# Patient Record
Sex: Female | Born: 1935
Health system: Southern US, Community
[De-identification: ages and names within clinical notes are randomized; demographics above are authoritative.]

## PROBLEM LIST (undated history)

## (undated) DIAGNOSIS — H5440 Blindness, one eye, unspecified eye: Secondary | ICD-10-CM

## (undated) DIAGNOSIS — IMO0001 Reserved for inherently not codable concepts without codable children: Secondary | ICD-10-CM

## (undated) DIAGNOSIS — K219 Gastro-esophageal reflux disease without esophagitis: Secondary | ICD-10-CM

## (undated) DIAGNOSIS — M1990 Unspecified osteoarthritis, unspecified site: Secondary | ICD-10-CM

## (undated) DIAGNOSIS — D329 Benign neoplasm of meninges, unspecified: Secondary | ICD-10-CM

## (undated) DIAGNOSIS — R079 Chest pain, unspecified: Secondary | ICD-10-CM

## (undated) DIAGNOSIS — IMO0002 Reserved for concepts with insufficient information to code with codable children: Secondary | ICD-10-CM

## (undated) DIAGNOSIS — C801 Malignant (primary) neoplasm, unspecified: Secondary | ICD-10-CM

## (undated) DIAGNOSIS — Z8052 Family history of malignant neoplasm of bladder: Secondary | ICD-10-CM

## (undated) DIAGNOSIS — I639 Cerebral infarction, unspecified: Secondary | ICD-10-CM

## (undated) DIAGNOSIS — D0330 Melanoma in situ of unspecified part of face: Principal | ICD-10-CM

## (undated) DIAGNOSIS — C50919 Malignant neoplasm of unspecified site of unspecified female breast: Secondary | ICD-10-CM

## (undated) DIAGNOSIS — Z803 Family history of malignant neoplasm of breast: Secondary | ICD-10-CM

## (undated) DIAGNOSIS — C4490 Unspecified malignant neoplasm of skin, unspecified: Secondary | ICD-10-CM

## (undated) DIAGNOSIS — E039 Hypothyroidism, unspecified: Secondary | ICD-10-CM

## (undated) DIAGNOSIS — Z9289 Personal history of other medical treatment: Secondary | ICD-10-CM

## (undated) DIAGNOSIS — Z8042 Family history of malignant neoplasm of prostate: Secondary | ICD-10-CM

## (undated) DIAGNOSIS — R911 Solitary pulmonary nodule: Secondary | ICD-10-CM

## (undated) DIAGNOSIS — Z923 Personal history of irradiation: Secondary | ICD-10-CM

## (undated) DIAGNOSIS — E785 Hyperlipidemia, unspecified: Secondary | ICD-10-CM

## (undated) DIAGNOSIS — I447 Left bundle-branch block, unspecified: Secondary | ICD-10-CM

## (undated) DIAGNOSIS — I10 Essential (primary) hypertension: Secondary | ICD-10-CM

## (undated) HISTORY — DX: Reserved for inherently not codable concepts without codable children: IMO0001

## (undated) HISTORY — DX: Malignant neoplasm of unspecified site of unspecified female breast: C50.919

## (undated) HISTORY — PX: DILATION AND CURETTAGE OF UTERUS: SHX78

## (undated) HISTORY — DX: Melanoma in situ of unspecified part of face: D03.30

## (undated) HISTORY — PX: BRAIN MENINGIOMA EXCISION: SHX576

## (undated) HISTORY — PX: CHOLECYSTECTOMY: SHX55

## (undated) HISTORY — PX: BILATERAL SALPINGOOPHORECTOMY: SHX1223

## (undated) HISTORY — DX: Hypothyroidism, unspecified: E03.9

## (undated) HISTORY — DX: Gastro-esophageal reflux disease without esophagitis: K21.9

## (undated) HISTORY — PX: APPENDECTOMY: SHX54

## (undated) HISTORY — DX: Essential (primary) hypertension: I10

## (undated) HISTORY — DX: Chest pain, unspecified: R07.9

## (undated) HISTORY — DX: Family history of malignant neoplasm of bladder: Z80.52

## (undated) HISTORY — DX: Reserved for concepts with insufficient information to code with codable children: IMO0002

## (undated) HISTORY — DX: Hyperlipidemia, unspecified: E78.5

## (undated) HISTORY — PX: BREAST LUMPECTOMY: SHX2

## (undated) HISTORY — DX: Family history of malignant neoplasm of prostate: Z80.42

## (undated) HISTORY — PX: MELANOMA EXCISION: SHX5266

## (undated) HISTORY — PX: CT RADIATION THERAPY GUIDE: HXRAD513

## (undated) HISTORY — DX: Family history of malignant neoplasm of breast: Z80.3

## (undated) HISTORY — DX: Left bundle-branch block, unspecified: I44.7

## (undated) HISTORY — PX: BREAST BIOPSY: SHX20

## (undated) HISTORY — PX: ABDOMINAL HYSTERECTOMY: SHX81

## (undated) HISTORY — PX: BUNIONECTOMY: SHX129

## (undated) HISTORY — PX: COLONOSCOPY: SHX174

---

## 1999-05-02 ENCOUNTER — Encounter: Payer: Self-pay | Admitting: Cardiology

## 1999-05-02 ENCOUNTER — Encounter: Admission: RE | Admit: 1999-05-02 | Discharge: 1999-05-02 | Payer: Self-pay | Admitting: Cardiology

## 2000-05-03 ENCOUNTER — Encounter: Payer: Self-pay | Admitting: Internal Medicine

## 2000-05-03 ENCOUNTER — Encounter: Admission: RE | Admit: 2000-05-03 | Discharge: 2000-05-03 | Payer: Self-pay | Admitting: Internal Medicine

## 2000-08-28 ENCOUNTER — Other Ambulatory Visit: Admission: RE | Admit: 2000-08-28 | Discharge: 2000-08-28 | Payer: Self-pay | Admitting: Internal Medicine

## 2001-05-06 ENCOUNTER — Encounter: Payer: Self-pay | Admitting: Internal Medicine

## 2001-05-06 ENCOUNTER — Encounter: Admission: RE | Admit: 2001-05-06 | Discharge: 2001-05-06 | Payer: Self-pay | Admitting: Internal Medicine

## 2002-05-04 ENCOUNTER — Encounter: Payer: Self-pay | Admitting: Internal Medicine

## 2002-05-04 ENCOUNTER — Encounter: Admission: RE | Admit: 2002-05-04 | Discharge: 2002-05-04 | Payer: Self-pay | Admitting: Internal Medicine

## 2002-06-04 HISTORY — PX: CARDIAC CATHETERIZATION: SHX172

## 2002-10-09 ENCOUNTER — Ambulatory Visit (HOSPITAL_COMMUNITY): Admission: RE | Admit: 2002-10-09 | Discharge: 2002-10-09 | Payer: Self-pay | Admitting: Cardiology

## 2003-05-07 ENCOUNTER — Encounter: Admission: RE | Admit: 2003-05-07 | Discharge: 2003-05-07 | Payer: Self-pay | Admitting: Internal Medicine

## 2003-08-01 ENCOUNTER — Emergency Department (HOSPITAL_COMMUNITY): Admission: EM | Admit: 2003-08-01 | Discharge: 2003-08-01 | Payer: Self-pay | Admitting: Emergency Medicine

## 2004-03-29 ENCOUNTER — Ambulatory Visit (HOSPITAL_COMMUNITY): Admission: RE | Admit: 2004-03-29 | Discharge: 2004-03-29 | Payer: Self-pay | Admitting: Orthopedic Surgery

## 2004-04-13 ENCOUNTER — Ambulatory Visit (HOSPITAL_BASED_OUTPATIENT_CLINIC_OR_DEPARTMENT_OTHER): Admission: RE | Admit: 2004-04-13 | Discharge: 2004-04-13 | Payer: Self-pay | Admitting: Orthopedic Surgery

## 2004-04-13 ENCOUNTER — Ambulatory Visit (HOSPITAL_COMMUNITY): Admission: RE | Admit: 2004-04-13 | Discharge: 2004-04-13 | Payer: Self-pay | Admitting: Orthopedic Surgery

## 2004-05-23 ENCOUNTER — Encounter: Admission: RE | Admit: 2004-05-23 | Discharge: 2004-05-23 | Payer: Self-pay | Admitting: Internal Medicine

## 2005-05-30 ENCOUNTER — Encounter: Admission: RE | Admit: 2005-05-30 | Discharge: 2005-05-30 | Payer: Self-pay | Admitting: Internal Medicine

## 2006-05-31 ENCOUNTER — Encounter: Admission: RE | Admit: 2006-05-31 | Discharge: 2006-05-31 | Payer: Self-pay | Admitting: Internal Medicine

## 2007-06-03 ENCOUNTER — Encounter: Admission: RE | Admit: 2007-06-03 | Discharge: 2007-06-03 | Payer: Self-pay | Admitting: Internal Medicine

## 2007-06-05 HISTORY — PX: BLADDER REPAIR: SHX76

## 2008-03-18 ENCOUNTER — Encounter: Admission: RE | Admit: 2008-03-18 | Discharge: 2008-03-18 | Payer: Self-pay | Admitting: Obstetrics & Gynecology

## 2008-04-20 ENCOUNTER — Ambulatory Visit (HOSPITAL_COMMUNITY): Admission: RE | Admit: 2008-04-20 | Discharge: 2008-04-21 | Payer: Self-pay | Admitting: Urology

## 2008-04-20 ENCOUNTER — Encounter: Payer: Self-pay | Admitting: Obstetrics & Gynecology

## 2008-06-03 ENCOUNTER — Encounter: Admission: RE | Admit: 2008-06-03 | Discharge: 2008-06-03 | Payer: Self-pay | Admitting: Internal Medicine

## 2008-06-26 ENCOUNTER — Emergency Department (HOSPITAL_COMMUNITY): Admission: EM | Admit: 2008-06-26 | Discharge: 2008-06-26 | Payer: Self-pay | Admitting: Emergency Medicine

## 2008-07-14 ENCOUNTER — Encounter: Admission: RE | Admit: 2008-07-14 | Discharge: 2008-07-14 | Payer: Self-pay | Admitting: Internal Medicine

## 2008-10-31 ENCOUNTER — Encounter: Admission: RE | Admit: 2008-10-31 | Discharge: 2008-10-31 | Payer: Self-pay | Admitting: Neurological Surgery

## 2009-06-06 ENCOUNTER — Encounter: Admission: RE | Admit: 2009-06-06 | Discharge: 2009-06-06 | Payer: Self-pay | Admitting: Internal Medicine

## 2009-11-01 ENCOUNTER — Encounter: Admission: RE | Admit: 2009-11-01 | Discharge: 2009-11-01 | Payer: Self-pay | Admitting: Neurological Surgery

## 2009-12-21 ENCOUNTER — Observation Stay (HOSPITAL_COMMUNITY): Admission: EM | Admit: 2009-12-21 | Discharge: 2009-12-21 | Payer: Self-pay | Admitting: Emergency Medicine

## 2009-12-21 ENCOUNTER — Encounter (INDEPENDENT_AMBULATORY_CARE_PROVIDER_SITE_OTHER): Payer: Self-pay | Admitting: Internal Medicine

## 2010-01-09 ENCOUNTER — Ambulatory Visit: Payer: Self-pay | Admitting: Cardiovascular Disease

## 2010-02-08 ENCOUNTER — Encounter: Admission: RE | Admit: 2010-02-08 | Discharge: 2010-02-08 | Payer: Self-pay | Admitting: Internal Medicine

## 2010-06-07 ENCOUNTER — Encounter
Admission: RE | Admit: 2010-06-07 | Discharge: 2010-06-07 | Payer: Self-pay | Source: Home / Self Care | Attending: Internal Medicine | Admitting: Internal Medicine

## 2010-08-19 LAB — BASIC METABOLIC PANEL
BUN: 12 mg/dL (ref 6–23)
BUN: 14 mg/dL (ref 6–23)
BUN: 14 mg/dL (ref 6–23)
CO2: 24 mEq/L (ref 19–32)
CO2: 28 mEq/L (ref 19–32)
CO2: 31 mEq/L (ref 19–32)
Calcium: 8.7 mg/dL (ref 8.4–10.5)
Calcium: 9.4 mg/dL (ref 8.4–10.5)
Calcium: 9.5 mg/dL (ref 8.4–10.5)
Chloride: 103 mEq/L (ref 96–112)
Chloride: 104 mEq/L (ref 96–112)
Chloride: 105 mEq/L (ref 96–112)
Creatinine, Ser: 0.51 mg/dL (ref 0.4–1.2)
Creatinine, Ser: 0.55 mg/dL (ref 0.4–1.2)
Creatinine, Ser: 0.61 mg/dL (ref 0.4–1.2)
GFR calc Af Amer: >60 mL/min (ref >60)
GFR calc Af Amer: >60 mL/min (ref >60)
GFR calc Af Amer: >60 mL/min (ref >60)
GFR calc non Af Amer: >60 mL/min (ref >60)
GFR calc non Af Amer: >60 mL/min (ref >60)
GFR calc non Af Amer: >60 mL/min (ref >60)
Glucose, Bld: 104 mg/dL — ABNORMAL HIGH (ref 70–99)
Glucose, Bld: 75 mg/dL (ref 70–99)
Glucose, Bld: 95 mg/dL (ref 70–99)
Potassium: 3.6 mEq/L (ref 3.5–5.1)
Potassium: 4 mEq/L (ref 3.5–5.1)
Potassium: 4.1 mEq/L (ref 3.5–5.1)
Sodium: 138 mEq/L (ref 135–145)
Sodium: 140 mEq/L (ref 135–145)
Sodium: 141 mEq/L (ref 135–145)

## 2010-08-19 LAB — DIFFERENTIAL
Basophils Absolute: 0 10*3/uL (ref 0.0–0.1)
Basophils Relative: 1 % (ref 0–1)
Eosinophils Absolute: 0.5 10*3/uL (ref 0.0–0.7)
Eosinophils Relative: 8 % — ABNORMAL HIGH (ref 0–5)
Lymphocytes Relative: 28 % (ref 12–46)
Lymphs Abs: 1.7 10*3/uL (ref 0.7–4.0)
Monocytes Absolute: 0.4 10*3/uL (ref 0.1–1.0)
Monocytes Relative: 7 % (ref 3–12)
Neutro Abs: 3.4 10*3/uL (ref 1.7–7.7)
Neutrophils Relative %: 56 % (ref 43–77)

## 2010-08-19 LAB — CBC
HCT: 35.9 % — ABNORMAL LOW (ref 36.0–46.0)
HCT: 37.1 % (ref 36.0–46.0)
Hemoglobin: 12.2 g/dL (ref 12.0–15.0)
Hemoglobin: 12.6 g/dL (ref 12.0–15.0)
MCH: 29.3 pg (ref 26.0–34.0)
MCH: 29.3 pg (ref 26.0–34.0)
MCHC: 34 g/dL (ref 30.0–36.0)
MCHC: 34 g/dL (ref 30.0–36.0)
MCV: 86.1 fL (ref 78.0–100.0)
MCV: 86.2 fL (ref 78.0–100.0)
Platelets: 179 10*3/uL (ref 150–400)
Platelets: 182 10*3/uL (ref 150–400)
RBC: 4.17 MIL/uL (ref 3.87–5.11)
RBC: 4.31 MIL/uL (ref 3.87–5.11)
RDW: 13.7 % (ref 11.5–15.5)
RDW: 13.8 % (ref 11.5–15.5)
WBC: 6 10*3/uL (ref 4.0–10.5)
WBC: 6 10*3/uL (ref 4.0–10.5)

## 2010-08-19 LAB — CARDIAC PANEL(CRET KIN+CKTOT+MB+TROPI)
CK, MB: 1.2 ng/mL (ref 0.3–4.0)
Relative Index: INVALID (ref 0.0–2.5)
Total CK: 45 U/L (ref 7–177)
Troponin I: 0.01 ng/mL (ref 0.00–0.06)

## 2010-08-19 LAB — LIPID PANEL
Cholesterol: 171 mg/dL (ref 0–200)
HDL: 42 mg/dL (ref >39)
LDL Cholesterol: 82 mg/dL (ref 0–99)
Total CHOL/HDL Ratio: 4.1 RATIO
Triglycerides: 237 mg/dL — ABNORMAL HIGH (ref <150)
VLDL: 47 mg/dL — ABNORMAL HIGH (ref 0–40)

## 2010-08-19 LAB — POCT CARDIAC MARKERS
CKMB, poc: 1.2 ng/mL (ref 1.0–8.0)
Myoglobin, poc: 38.2 ng/mL (ref 12–200)
Troponin i, poc: <0.05 ng/mL (ref 0.00–0.09)

## 2010-08-19 LAB — CK TOTAL AND CKMB (NOT AT ARMC)
CK, MB: 1.4 ng/mL (ref 0.3–4.0)
Relative Index: INVALID (ref 0.0–2.5)
Total CK: 38 U/L (ref 7–177)

## 2010-08-19 LAB — TROPONIN I: Troponin I: 0.01 ng/mL (ref 0.00–0.06)

## 2010-09-18 LAB — POCT I-STAT, CHEM 8
Calcium, Ion: 1.22 mmol/L (ref 1.12–1.32)
Chloride: 102 mEq/L (ref 96–112)
Glucose, Bld: 105 mg/dL — ABNORMAL HIGH (ref 70–99)
HCT: 42 % (ref 36.0–46.0)
TCO2: 32 mmol/L (ref 0–100)

## 2010-10-17 NOTE — Op Note (Signed)
Melissa Snyder, Melissa Snyder                ACCOUNT NO.:  [id number]   MEDICAL RECORD NO.:  [id number]          PATIENT TYPE:  OIB   LOCATION:  1431                         FACILITY:  [facility]   PHYSICIAN:  Martina Sinner, MD DATE OF BIRTH:  29-Oct-1933   DATE OF PROCEDURE:  04/20/2008  DATE OF DISCHARGE:                               OPERATIVE REPORT   PREOPERATIVE DIAGNOSIS:  Cystocele.   POSTOPERATIVE DIAGNOSIS:  Cystocele.   PROCEDURE:  Repair of cystocele plus cystoscopy.   SURGEON:  Martina Sinner, MD   ASSISTANT:  Dr. Duane Boston.   PROCEDURE IN DETAIL:  Melissa Snyder had a mild grade 3 cystocele and  mild loss of vaginal cuff support.  She was here for surgery.  She was  prepped and draped in the usual fashion.  Extra care was taken with leg  positioning to minimize the risk of compartment syndrome, neuropathy and  DVT.  Preoperative antibiotics were given.  Lab work was normal.   Dr. Leda Quail initially removed an epithelial polyp and fulgurated  the base.  This was along the left pelvic side wall anteriorly.  It did  not hinder my dissection.   Under anesthesia there was no question the cuff was very well supported.  I could identify the dimples and I placed a 2-0 Vicryl at the vaginal  cuff.  She had moderate anterior vaginal length but a small central  defect and minimal paravaginal defect.  She really did not have a lot of  trap door deformity.  She had a narrow vagina and actually the  dissection was easier without using any sort of posterior vaginal  speculum, even a short one.  I instilled 25 mL of lidocaine/epinephrine  mixture for the dissection plane and for hemostasis.  Between Allis  clamps I made an anterior T-shaped incision and sharply dissected the  anterior vaginal wall from the pubocervical fascia all the way to the  pelvic sidewall.  I also was careful in dissecting a few approximately a  1/2 centimeter or so beyond my marking suture at the  apex.  I did this  because she really did not have a lot of length after the dissection.  I  went almost to the white line bilaterally.  I fulgurated some bleeders  without difficulty.   I was surprised on the minimal amount of defect she had.  I did a two  layer anterior repair with 2-0 Vicryl.  The repair was very flat and the  second layer was just to give it some extra strength.  In my opinion  there was no need for a graft and actually there was not a lot of room  for a graft.   I trimmed a few millimeters to 1 cm of anterior vaginal wall mucosa  bilaterally.  I double checked with my repair and actually it looked  very good.  I closed the anterior vaginal wall with running 2-0 Vicryl  on a CT1 needle.  Irrigation was used.  The repair looked excellent.  Estrace cream pack was placed.  Leg position was good.  Following the anterior repair I cystoscoped the bladder.  This was good  bilateral jet of indigo carmine.  There was a nice flattening without  any cystocele.  There was no distortion of the bladder.  Overall I was  very pleased with the surgery and hopefully __________.           ______________________________  Martina Sinner, MD  Electronically Signed     SAM/MEDQ  D:  04/20/2008  T:  04/21/2008  Job:  [id number]

## 2010-10-17 NOTE — Op Note (Signed)
Melissa Snyder, Melissa Snyder                ACCOUNT NO.:  [id number]   MEDICAL RECORD NO.:  [id number]          PATIENT TYPE:  AMB   LOCATION:  DAY                          FACILITY:  [facility]   PHYSICIAN:  M. Leda Quail, MD  DATE OF BIRTH:  [birth year]   DATE OF PROCEDURE:  04/20/2008  DATE OF DISCHARGE:                               OPERATIVE REPORT   PREOPERATIVE DIAGNOSES:  42. A 77 year old white female with a history of symptomatic cystocele.  2. Vaginal polyp.  3. History of previous hysterectomy.   POSTOPERATIVE DIAGNOSES:  1. A 77 year old white female with a history of symptomatic cystocele.  2. Vaginal polyp.  3. History of previous hysterectomy.   PROCEDURE:  Vaginal polyp excision.   ANESTHESIA:  General endotracheal, Dr. Quentin Cornwall. Council Mechanic is overseeing  the case.   ESTIMATED BLOOD LOSS:  Minimal.   URINE OUTPUT:  The procedure was so short that no urine output was  measured.   FLUIDS:  About 200 mL of LR.   SPECIMENS:  Vaginal polyp sent to pathology.   COMPLICATIONS:  None.   INDICATIONS:  Mrs. Giarratano is a very nice 77 year old white female with  history of hysterectomy who was referred to me by Dr. Redge Gainer. Perini  secondary to a vaginal polyp.  On exam, this looks like a  fibroepithelial polyp.  The patient had a significant cystocele that was  noted on exam which was symptomatic.  I refereed her initially for  pelvic CT in which she had some difficulty in completing and ultimately  was referred to MacDiarmid's PA after urodynamic  testing with  recommendations of proceeding with a cystocele repair with graft was  discussed, and the patient is presenting for this today. Because he is  working vaginally, felt it was appropriate to at the same time remove  vaginal polyp.  The risks and benefits have been explained to the  patient and informed consent is present on the chart.   DESCRIPTION OF PROCEDURE:  The patient was taken to the operating room.  She was placed  in supine position.  General endotracheal anesthesia  administered by the Anesthesia staff.  The legs were positioned in [place]  stirrups with the SCDs on.  The patient's hair is clipped per Dr.  McDiarmid's preference.  Betadine prep of the abdomen, perineum and  vagina were performed.  The patient was then draped in the normal  sterile fashion.  A Harrison Mons was positioned in the high lithotomy position.  A Graves speculum was placed in the posterior aspect of the vagina.  A  fibroepithelial polyp was noted.  It was grasped with pickups with teeth  and excised with curved Mayo scissors.  The base was cauterized.  Initially, I was going to close the defect with a 2-0 Vicryl, but Dr.  McDiarmid wanted me not to place any stitches.  At this point, my  portion of the procedure was ended and he was present and ready to  proceed.  There was no bleeding at this time.   The patient was stable at this point when I left the  operating room.      Lum Keas, MD  Electronically Signed     MSM/MEDQ  D:  04/20/2008  T:  04/20/2008  Job:  [id number]   cc:   Leighton Roach McDiarmid, M.D.   Redge Gainer. Perini, M.D.  Fax: [phone]

## 2010-10-20 NOTE — H&P (Signed)
NAME:  Melissa Snyder, Melissa Snyder NO.:  [id number]   MEDICAL RECORD NO.:  [id number]                   PATIENT TYPE:  OIB   LOCATION:                                       FACILITY:  [facility]   PHYSICIAN:  Peter M. Swaziland, M.D.               DATE OF BIRTH:  1934/02/08   DATE OF ADMISSION:  10/09/2002  DATE OF DISCHARGE:                                HISTORY & PHYSICAL   HISTORY OF PRESENT ILLNESS:  The patient is a 77 year old white female who  has generally been in good health.  She has a history of hypertension and  hypothyroidism.  Over the past year, she has noticed occasional episodes of  chest tightness and pain with exertion.  The tightness occurs across her  anterior precordium and seems to occur with exertion.  Generally, it just  goes away on its own.  She has no associated shortness of breath or  palpitations.  She has noticed this more recently when she pushes her mower.  She, in fact, thinks that her chest pain has worsened over the last three  weeks.  She has no prior cardiac disease.  She was found to have a left  bundle branch block on ECG.  She subsequently was referred for adenosine  Cardiolite study, however, this study was uninterruptible, related to the  fact that she had a very intense focal hot spot over the anterior wall which  obscured evaluation of the other wall segments.  Her ejection fraction did  appear normal at 55%.  Because of her uninterruptible noninvasive stress  test and the fact that she has ongoing chest pain, left bundle branch block  and some cardiac risk factors, it was felt that she would be best evaluated  with coronary angiography, and she has been admitted for that procedure.   PAST MEDICAL HISTORY:  Past medical history is significant for hypertension,  hypothyroidism, gastroesophageal reflux disease.  She has had previous  hysterectomy and cholecystectomy.  She has had previous D&C x2 and four  successful  childbirths.   ALLERGIES:  Allergies to IBUPROFEN which causes ulcers in her month.   CURRENT MEDICATIONS:  1. Levoxyl 112 mcg per day.  2. Atenolol 50 mg per day.  3. Multiple supplements including:     a. __________ once daily     b. Glucosatrim two tablets twice a day.     c. Tri-Omega daily.     d. Isocron one teaspoon daily.     e. OPC-3 one teaspoon daily.     f. Orac one teaspoon daily.     g. Calcium Plus two teaspoons b.i.d.     h. A vision supplement -- one teaspoon daily.        a. B12 supplement -- one teaspoon daily.     i. A digestive enzyme -- one teaspoon daily.   SOCIAL HISTORY:  The patient  is married.  She is retired.  She denies  tobacco or alcohol use.  She has three living children.   FAMILY HISTORY:  Father died at age 87 of myocardial infarction and  hypertension as well as kidney failure.  Mother died at age 25 of a  myocardial infarction and hypertension.  One brother has a history of  coronary disease, status post CABG in his early 32s, and one sister died  with Bright's disease.   REVIEW OF SYSTEMS:  Review of systems is otherwise negative.  She has had no  history of claudication or TIA, no history of a murmur.  She denies  orthopnea or PND, and no history of syncope.   PHYSICAL EXAMINATION:  GENERAL:  On physical exam, the patient is a pleasant  white female in no apparent distress.  Weight is 148.  VITAL SIGNS:  Blood pressure 152/72, pulse 70 and regular.  HEENT:  Pupils are equal, round and reactive to light and accommodation.  Extraocular movements are full.  Oropharynx is clear.  NECK:  Neck is supple without JVD, adenopathy, thyromegaly or bruits.  LUNGS:  Lungs are clear to auscultation and percussion.  CARDIAC:  Exam reveals a regular rate and rhythm, without gallops, murmurs  or clicks.  ABDOMEN:  Abdomen is soft and nontender without masses or  hepatosplenomegaly.  EXTREMITIES:  Femoral and pedal pulses are 2+ and symmetric.  She has  no  edema or phlebitis.  NEUROLOGIC:  Exam is nonfocal.   LABORATORY DATA:  ECG demonstrates normal sinus rhythm with left axis  deviation, left bundle branch block.   Chest x-ray shows some hyperinflation, otherwise, no active disease.   IMPRESSION:  1. Symptoms of chest pain consistent with angina pectoris.  Noninvasive     Cardiolite study was uninterruptible.  2. Left bundle branch block.  3. Hypertension.  4. Family history of coronary disease.  5. Hypothyroidism.  6. Gastroesophageal reflux disease.  7. Mild hypercholesterolemia.   PLAN:  The patient will be admitted for coronary angiography, with further  therapy pending these results.                                               Peter M. Swaziland, M.D.    PMJ/MEDQ  D:  10/05/2002  T:  10/06/2002  Job:  [id number]   cc:   Loraine Leriche A. Waynard Edwards, M.D.  [address]  [place]  [place] [address]  Fax: [phone]

## 2010-10-20 NOTE — Op Note (Signed)
NAMEINOLA, LISLE                ACCOUNT NO.:  [id number]   MEDICAL RECORD NO.:  [id number]          PATIENT TYPE:  AMB   LOCATION:  DSC                          FACILITY:  [facility]   PHYSICIAN:  Katy Fitch. Sypher Montez Hageman., M.D.DATE OF BIRTH:  [birth year]   DATE OF PROCEDURE:  04/13/2004  DATE OF DISCHARGE:                                 OPERATIVE REPORT   PREOPERATIVE DIAGNOSIS:  Painful mass left ring finger A1 pulley with mild  stenosing tenosynovitis.   POSTOPERATIVE DIAGNOSIS:  Painful mass left ring finger A1 pulley with mild  stenosing tenosynovitis.   OPERATION:  Excision of flexor sheath ganglion and release of A1 pulley,  left ring finger.   SURGEON:  Katy Fitch. Sypher, M.D.   ASSISTANT:  Marveen Reeks. Dasnoit, P.A.-C.   ANESTHESIA:  0.25% Marcaine and 2% lidocaine metacarpal head level block and  flexor sheath block of the left ring finger supplemented by IV sedation.   ANESTHESIOLOGIST:  Burna Forts, M.D.   INDICATIONS FOR PROCEDURE:  Edessa Jakubowicz is a 77 year old woman referred for  evaluation of a mass on the ulnar aspect of her wrist and a mass overlying  her ring finger flexor sheath on the left hand.  Clinical examination  revealed minimal stenosing tenosynovitis and a large flexor sheath ganglion  measuring about 1.5 cm in diameter on the palmar surface of the A1 pulley.  Examination of the wrist revealed signs of internal derangement with an  ulnar sided ganglion.  She was sent for an MRI of her wrist which revealed a  cartilage tear and her ganglion adjacent to the pisotriquetral joint.  We  discussed her predicament in detail.  She chose not to seek treatment for  her cartilage tear due to chronic ulnar cartilage abutment and accepted to  live with her ulnar sided wrist ganglion.  She did, however, request that we  remove the mass from her palm which is causing her significant clinical  discomfort.  After informed consent, she was brought to the operating  room  at this time.   PROCEDURE:  Jahnay Lantier is brought to the operating room and placed on  supine position on the operating table.  Following light sedation, the left  arm was prepped with Betadine solution and sterilely draped.  0.25% Marcaine  and 2% lidocaine were infiltrated into the path of the intended incision and  the left ring finger flexor sheath to obtain a digital block.  When  anesthesia was satisfactory, the arm was exsanguinated with an Esmarch  bandage and an arterial tourniquet on the proximal brachium inflated to 220  mmHg.  The procedure commenced with a transverse incision directly over the  palpable mass.  The subcutaneous tissues were carefully divided taking care  to identify the neurovascular bundles which were gently retracted.  The cyst  measured 1.5 cm in diameter and was circumferentially dissected and followed  down to the A1 pulley.  This was subsequently removed piecemeal with a  rongeur.  The pulley was cleaned thoroughly of all abnormal appearing  mucinous tissues followed by incision of the pulley to  release the area of  stenosing tenosynovitis.  A very small portion of the junction between the  A1 and A2 pulley was also released to prevent irritation due to swelling of  the flexor tendon.  The wound was inspected for bleeding points and  subsequently repaired with interrupted sutures of 5-0 nylon.  There were no  apparent complications.   For aftercare and pain relief, Ms. Azizi was advised to elevate her hand for  four days, work on immediate range of motion exercises, and take Darvocet  N100, 1 p.o. q.4-6h. p.r.n. pain.  She is given 20 tablets without refill.      Robe   RVS/MEDQ  D:  04/13/2004  T:  04/13/2004  Job:  [id number]

## 2010-10-20 NOTE — Cardiovascular Report (Signed)
   [name], DITTMAN NO.:  [id number]   MEDICAL RECORD NO.:  [id number]                   PATIENT TYPE:  OIB   LOCATION:  2853                                 FACILITY:  [facility]   PHYSICIAN:  Peter M. [place]azil[place], M.D.               DATE OF BIRTH:  Feb 12, 1934   DATE OF PROCEDURE:  10/09/2002  DATE OF DISCHARGE:                              CARDIAC CATHETERIZATION   INDICATIONS FOR PROCEDURE:  The patient is a 77 year old white female who  presents with symptoms of recurrent chest pain. She has a left bundle branch  block. She has a history of hypertension and mild hyperlipidemia. A stress  Cardiolite study was not interpretable due to abnormal focal hot spot on her  scan.   ACCESS:  Via the right femoral artery using standard Seldinger technique.   EQUIPMENT:  A 6 French 4 cm right and left Judkin's catheter. A 6 French  pigtail catheter. A 6 French arterial sheath.   MEDICATIONS:  Local anesthesia with 1% Xylocaine.   CONTRAST:  140 cc of Omnipaque.   HEMODYNAMIC DATA:  1. Aortic pressure 134/58 with a mean of 88 mmHg.  2. Left ventricular pressure 134 with and EDP of 14 mmHg.   ANGIOGRAPHIC DATA:  1. Left coronary artery arises and distributes normally.  2. The left main coronary artery is normal.  3. The left anterior descending artery and its branches are normal.  4. The left circumflex coronary artery is normal.  5. The right coronary artery has an inferior takeoff. It has mild     irregularity proximally up to 10%. Otherwise it appears normal.  6. Left ventricular angiography performed in the RAO and LAO cranial views     demonstrates normal left ventricular size and contractility with normal     systolic function. Ejection fraction is estimated at 65%. There is no     significant mitral valve prolapse or insufficiency. The aortic valve     appears normal. The aortic root appears normal.    FINAL INTERPRETATION:  1. Normal coronary  anatomy.  2. Normal left ventricular function.                                                  Peter M. Swaziland, M.D.    PMJ/MEDQ  D:  10/09/2002  T:  10/12/2002  Job:  [id number]   cc:   Loraine Leriche A. Waynard Edwards, M.D.  [address]  [place]  [place] [address]  Fax: [phone]

## 2011-03-06 LAB — CBC
HCT: 32.5 — ABNORMAL LOW
Hemoglobin: 11 — ABNORMAL LOW
Hemoglobin: 13
MCHC: 34.1
MCV: 86.5
MCV: 86.9
RBC: 3.76 — ABNORMAL LOW
RBC: 4.4
WBC: 5.8

## 2011-03-06 LAB — BASIC METABOLIC PANEL
CO2: 32
Chloride: 105
GFR calc Af Amer: >60
Glucose, Bld: 100 — ABNORMAL HIGH
Sodium: 140

## 2011-04-05 ENCOUNTER — Other Ambulatory Visit: Payer: Self-pay | Admitting: Internal Medicine

## 2011-04-05 DIAGNOSIS — Z1231 Encounter for screening mammogram for malignant neoplasm of breast: Secondary | ICD-10-CM

## 2011-06-11 ENCOUNTER — Ambulatory Visit
Admission: RE | Admit: 2011-06-11 | Discharge: 2011-06-11 | Disposition: A | Discharge status: Home / Self Care | Payer: Medicare Other | Source: Ambulatory Visit | Attending: Internal Medicine | Admitting: Internal Medicine

## 2011-06-11 DIAGNOSIS — Z1231 Encounter for screening mammogram for malignant neoplasm of breast: Secondary | ICD-10-CM

## 2011-06-15 ENCOUNTER — Other Ambulatory Visit: Payer: Self-pay | Admitting: Internal Medicine

## 2011-06-15 DIAGNOSIS — R928 Other abnormal and inconclusive findings on diagnostic imaging of breast: Secondary | ICD-10-CM

## 2011-06-26 ENCOUNTER — Ambulatory Visit
Admission: RE | Admit: 2011-06-26 | Discharge: 2011-06-26 | Disposition: A | Discharge status: Home / Self Care | Payer: Medicare Other | Source: Ambulatory Visit | Attending: Internal Medicine | Admitting: Internal Medicine

## 2011-06-26 DIAGNOSIS — R928 Other abnormal and inconclusive findings on diagnostic imaging of breast: Secondary | ICD-10-CM

## 2011-08-28 ENCOUNTER — Other Ambulatory Visit: Payer: Self-pay | Admitting: Neurological Surgery

## 2011-08-28 DIAGNOSIS — D496 Neoplasm of unspecified behavior of brain: Secondary | ICD-10-CM

## 2011-09-03 ENCOUNTER — Ambulatory Visit
Admission: RE | Admit: 2011-09-03 | Discharge: 2011-09-03 | Disposition: A | Discharge status: Home / Self Care | Payer: Medicare Other | Source: Ambulatory Visit | Attending: Neurological Surgery | Admitting: Neurological Surgery

## 2011-09-03 DIAGNOSIS — D496 Neoplasm of unspecified behavior of brain: Secondary | ICD-10-CM

## 2011-09-03 MED ORDER — GADOBENATE DIMEGLUMINE 529 MG/ML IV SOLN
12.0000 mL | Freq: Once | INTRAVENOUS | Status: AC | PRN
  Administered 2011-09-03: 12 mL via INTRAVENOUS

## 2011-11-09 ENCOUNTER — Other Ambulatory Visit: Payer: Self-pay | Admitting: Internal Medicine

## 2011-11-09 DIAGNOSIS — R921 Mammographic calcification found on diagnostic imaging of breast: Secondary | ICD-10-CM

## 2011-12-07 ENCOUNTER — Ambulatory Visit
Admission: RE | Admit: 2011-12-07 | Discharge: 2011-12-07 | Disposition: A | Discharge status: Home / Self Care | Payer: Medicare Other | Source: Ambulatory Visit | Attending: Internal Medicine | Admitting: Internal Medicine

## 2011-12-07 DIAGNOSIS — R921 Mammographic calcification found on diagnostic imaging of breast: Secondary | ICD-10-CM

## 2012-04-18 ENCOUNTER — Other Ambulatory Visit: Payer: Self-pay | Admitting: Internal Medicine

## 2012-04-18 DIAGNOSIS — N644 Mastodynia: Secondary | ICD-10-CM

## 2012-04-25 ENCOUNTER — Ambulatory Visit
Admission: RE | Admit: 2012-04-25 | Discharge: 2012-04-25 | Disposition: A | Discharge status: Home / Self Care | Payer: Medicare Other | Source: Ambulatory Visit | Attending: Internal Medicine | Admitting: Internal Medicine

## 2012-04-25 ENCOUNTER — Other Ambulatory Visit: Payer: Self-pay | Admitting: Internal Medicine

## 2012-04-25 DIAGNOSIS — N644 Mastodynia: Secondary | ICD-10-CM

## 2013-02-19 ENCOUNTER — Other Ambulatory Visit: Payer: Self-pay | Admitting: Neurological Surgery

## 2013-02-19 DIAGNOSIS — D320 Benign neoplasm of cerebral meninges: Secondary | ICD-10-CM

## 2013-03-02 ENCOUNTER — Ambulatory Visit
Admission: RE | Admit: 2013-03-02 | Discharge: 2013-03-02 | Disposition: A | Discharge status: Home / Self Care | Payer: Medicare Other | Source: Ambulatory Visit | Attending: Neurological Surgery | Admitting: Neurological Surgery

## 2013-03-02 DIAGNOSIS — D320 Benign neoplasm of cerebral meninges: Secondary | ICD-10-CM

## 2013-03-02 MED ORDER — GADOBENATE DIMEGLUMINE 529 MG/ML IV SOLN
13.0000 mL | Freq: Once | INTRAVENOUS | Status: AC | PRN
  Administered 2013-03-02: 13 mL via INTRAVENOUS

## 2013-03-23 ENCOUNTER — Other Ambulatory Visit: Payer: Self-pay

## 2013-03-23 DIAGNOSIS — Z1231 Encounter for screening mammogram for malignant neoplasm of breast: Secondary | ICD-10-CM

## 2013-04-27 ENCOUNTER — Ambulatory Visit
Admission: RE | Admit: 2013-04-27 | Discharge: 2013-04-27 | Disposition: A | Discharge status: Home / Self Care | Payer: Medicare HMO | Source: Ambulatory Visit

## 2013-04-27 DIAGNOSIS — Z1231 Encounter for screening mammogram for malignant neoplasm of breast: Secondary | ICD-10-CM

## 2013-06-04 DIAGNOSIS — I639 Cerebral infarction, unspecified: Secondary | ICD-10-CM

## 2013-06-04 HISTORY — DX: Cerebral infarction, unspecified: I63.9

## 2013-10-02 DIAGNOSIS — H5440 Blindness, one eye, unspecified eye: Secondary | ICD-10-CM

## 2013-10-02 HISTORY — DX: Blindness, one eye, unspecified eye: H54.40

## 2013-10-24 ENCOUNTER — Encounter (HOSPITAL_COMMUNITY): Payer: Self-pay | Admitting: Emergency Medicine

## 2013-10-24 ENCOUNTER — Emergency Department (HOSPITAL_COMMUNITY): Payer: Medicare HMO

## 2013-10-24 ENCOUNTER — Inpatient Hospital Stay (HOSPITAL_COMMUNITY)
Admission: EM | Admit: 2013-10-24 | Discharge: 2013-10-26 | DRG: 123 | Disposition: A | Discharge status: Home / Self Care | Payer: Medicare HMO | Attending: Internal Medicine | Admitting: Internal Medicine

## 2013-10-24 DIAGNOSIS — E039 Hypothyroidism, unspecified: Secondary | ICD-10-CM

## 2013-10-24 DIAGNOSIS — I447 Left bundle-branch block, unspecified: Secondary | ICD-10-CM | POA: Diagnosis present

## 2013-10-24 DIAGNOSIS — D320 Benign neoplasm of cerebral meninges: Secondary | ICD-10-CM | POA: Diagnosis present

## 2013-10-24 DIAGNOSIS — H3410 Central retinal artery occlusion, unspecified eye: Principal | ICD-10-CM

## 2013-10-24 DIAGNOSIS — H53139 Sudden visual loss, unspecified eye: Secondary | ICD-10-CM

## 2013-10-24 DIAGNOSIS — H269 Unspecified cataract: Secondary | ICD-10-CM | POA: Diagnosis present

## 2013-10-24 DIAGNOSIS — E785 Hyperlipidemia, unspecified: Secondary | ICD-10-CM | POA: Diagnosis present

## 2013-10-24 DIAGNOSIS — I10 Essential (primary) hypertension: Secondary | ICD-10-CM

## 2013-10-24 DIAGNOSIS — H3412 Central retinal artery occlusion, left eye: Secondary | ICD-10-CM

## 2013-10-24 DIAGNOSIS — Z79899 Other long term (current) drug therapy: Secondary | ICD-10-CM

## 2013-10-24 DIAGNOSIS — K219 Gastro-esophageal reflux disease without esophagitis: Secondary | ICD-10-CM

## 2013-10-24 LAB — CBC
HCT: 35.6 % — ABNORMAL LOW (ref 36.0–46.0)
HEMATOCRIT: 39.4 % (ref 36.0–46.0)
HEMOGLOBIN: 13.1 g/dL (ref 12.0–15.0)
Hemoglobin: 11.9 g/dL — ABNORMAL LOW (ref 12.0–15.0)
MCH: 28.5 pg (ref 26.0–34.0)
MCH: 28.7 pg (ref 26.0–34.0)
MCHC: 33.2 g/dL (ref 30.0–36.0)
MCHC: 33.4 g/dL (ref 30.0–36.0)
MCV: 85.8 fL (ref 78.0–100.0)
MCV: 86 fL (ref 78.0–100.0)
Platelets: 182 10*3/uL (ref 150–400)
Platelets: 156 10*3/uL (ref 150–400)
RBC: 4.59 MIL/uL (ref 3.87–5.11)
RBC: 4.14 MIL/uL (ref 3.87–5.11)
RDW: 13.5 % (ref 11.5–15.5)
RDW: 13.8 % (ref 11.5–15.5)
WBC: 6.3 10*3/uL (ref 4.0–10.5)
WBC: 6.1 10*3/uL (ref 4.0–10.5)

## 2013-10-24 LAB — C-REACTIVE PROTEIN

## 2013-10-24 LAB — CREATININE, SERUM
CREATININE: 0.84 mg/dL (ref 0.50–1.10)
GFR calc Af Amer: 75 mL/min — ABNORMAL LOW (ref >90)
GFR, EST NON AFRICAN AMERICAN: 65 mL/min — AB (ref >90)

## 2013-10-24 LAB — BASIC METABOLIC PANEL
BUN: 16 mg/dL (ref 6–23)
CHLORIDE: 101 meq/L (ref 96–112)
CO2: 27 meq/L (ref 19–32)
Calcium: 9.5 mg/dL (ref 8.4–10.5)
Creatinine, Ser: 0.66 mg/dL (ref 0.50–1.10)
GFR calc Af Amer: >90 mL/min (ref >90)
GFR calc non Af Amer: 82 mL/min — ABNORMAL LOW (ref >90)
GLUCOSE: 90 mg/dL (ref 70–99)
POTASSIUM: 4.3 meq/L (ref 3.7–5.3)
Sodium: 140 mEq/L (ref 137–147)

## 2013-10-24 LAB — PROTIME-INR
INR: 1.01 (ref 0.00–1.49)
Prothrombin Time: 13.1 seconds (ref 11.6–15.2)

## 2013-10-24 LAB — SEDIMENTATION RATE: SED RATE: 15 mm/h (ref 0–22)

## 2013-10-24 LAB — TSH: TSH: 1.28 u[IU]/mL (ref 0.350–4.500)

## 2013-10-24 MED ORDER — ONE-DAILY MULTI VITAMINS PO TABS: 1.0000 | ORAL_TABLET | Freq: Every morning | ORAL | Status: DC

## 2013-10-24 MED ORDER — LEVOTHYROXINE SODIUM 112 MCG PO TABS
112.0000 ug | ORAL_TABLET | Freq: Every day | ORAL | Status: DC
  Administered 2013-10-25 – 2013-10-26 (×2): 112 ug via ORAL
  Filled 2013-10-24 (×4): qty 1

## 2013-10-24 MED ORDER — ENOXAPARIN SODIUM 40 MG/0.4ML ~~LOC~~ SOLN
40.0000 mg | SUBCUTANEOUS | Status: DC
  Administered 2013-10-24 – 2013-10-25 (×2): 40 mg via SUBCUTANEOUS
  Filled 2013-10-24 (×3): qty 0.4

## 2013-10-24 MED ORDER — ATENOLOL 50 MG PO TABS
50.0000 mg | ORAL_TABLET | Freq: Every day | ORAL | Status: DC
  Administered 2013-10-24 – 2013-10-25 (×2): 50 mg via ORAL
  Filled 2013-10-24 (×3): qty 1

## 2013-10-24 MED ORDER — SODIUM CHLORIDE 0.9 % IV SOLN: 250.0000 mL | INTRAVENOUS | Status: DC | PRN

## 2013-10-24 MED ORDER — ACETAMINOPHEN 650 MG RE SUPP: 650.0000 mg | RECTAL | Status: DC | PRN

## 2013-10-24 MED ORDER — ADULT MULTIVITAMIN W/MINERALS CH
1.0000 | ORAL_TABLET | Freq: Every day | ORAL | Status: DC
  Administered 2013-10-25 – 2013-10-26 (×2): 1 via ORAL
  Filled 2013-10-24 (×2): qty 1

## 2013-10-24 MED ORDER — CALCIUM CARBONATE-VITAMIN D 500-200 MG-UNIT PO TABS
1.0000 | ORAL_TABLET | Freq: Every day | ORAL | Status: DC
  Administered 2013-10-25 – 2013-10-26 (×2): 1 via ORAL
  Filled 2013-10-24 (×3): qty 1

## 2013-10-24 MED ORDER — OMEGA-3 FATTY ACIDS 1000 MG PO CAPS: 1.0000 g | ORAL_CAPSULE | Freq: Every morning | ORAL | Status: DC

## 2013-10-24 MED ORDER — OCUVITE PO TABS
1.0000 | ORAL_TABLET | Freq: Every day | ORAL | Status: DC
  Administered 2013-10-25 – 2013-10-26 (×2): 1 via ORAL
  Filled 2013-10-24 (×2): qty 1

## 2013-10-24 MED ORDER — OMEGA-3-ACID ETHYL ESTERS 1 G PO CAPS
1.0000 g | ORAL_CAPSULE | Freq: Two times a day (BID) | ORAL | Status: DC
  Administered 2013-10-25 – 2013-10-26 (×3): 1 g via ORAL
  Filled 2013-10-24 (×5): qty 1

## 2013-10-24 MED ORDER — SODIUM CHLORIDE 0.9 % IJ SOLN
3.0000 mL | Freq: Two times a day (BID) | INTRAMUSCULAR | Status: DC
  Administered 2013-10-24 – 2013-10-26 (×4): 3 mL via INTRAVENOUS

## 2013-10-24 MED ORDER — PANTOPRAZOLE SODIUM 40 MG PO TBEC
40.0000 mg | DELAYED_RELEASE_TABLET | Freq: Every day | ORAL | Status: DC
  Administered 2013-10-24 – 2013-10-26 (×3): 40 mg via ORAL
  Filled 2013-10-24 (×2): qty 1

## 2013-10-24 MED ORDER — SODIUM CHLORIDE 0.9 % IJ SOLN: 3.0000 mL | INTRAMUSCULAR | Status: DC | PRN

## 2013-10-24 MED ORDER — ASPIRIN 81 MG PO CHEW
81.0000 mg | CHEWABLE_TABLET | Freq: Every day | ORAL | Status: DC
  Administered 2013-10-24 – 2013-10-26 (×3): 81 mg via ORAL
  Filled 2013-10-24 (×3): qty 1

## 2013-10-24 MED ORDER — ACETAMINOPHEN 325 MG PO TABS
650.0000 mg | ORAL_TABLET | ORAL | Status: DC | PRN
  Filled 2013-10-24: qty 2

## 2013-10-24 MED ORDER — SENNOSIDES-DOCUSATE SODIUM 8.6-50 MG PO TABS: 1.0000 | ORAL_TABLET | Freq: Every evening | ORAL | Status: DC | PRN

## 2013-10-24 NOTE — Consult Note (Signed)
Referring Physician: Jerilee Hoh    Chief Complaint: Loss of vision in the left eye  HPI: Melissa Snyder is an 80 y.o. female who woke up with "scrambled vision" with her left eye this morning upon waking (around 8 am). No Headache, nausea or other associated symptoms. She called her optometrist who agreed to see her at noon. On the way to [facility], she noted that the vision went out completely OS. No pain. No weakness, numbness, dizziness or gait instability noted.  After being seen there the patient was sent to the ED where she was evaluated by Ophthalmology and diagnosed with a central retinal artery occlusion.    Date last known well: Date: 10/23/2013 Time last known well: Time: 22:30 tPA Given: No: Outside time window  Past Medical History  Diagnosis Date  . Chest pain   . GERD (gastroesophageal reflux disease)   . Bundle branch block left   . Hyperlipidemia   . Hypertension   . Hypothyroid   . Migraine     Past Surgical History  Procedure Laterality Date  . Cardiac catheterization      Family history: Mother deceased of heart problems.  Father deceased with prostate cancer and heart problems as well.   Social History:  reports that she has never smoked. She has never used smokeless tobacco. She reports that she does not drink alcohol or use illicit drugs.  Allergies:  Allergies  Allergen Reactions  . Ibuprofen Other (See Comments)    Causes mouth sores  . Tramadol Itching  . Niacin And Related Other (See Comments)    Flushing, burning, redness    Medications:  I have reviewed the patient's current medications. Prior to Admission:  Prescriptions prior to admission  Medication Sig Dispense Refill  . atenolol (TENORMIN) 50 MG tablet Take 50 mg by mouth daily.      . beta carotene w/minerals (OCUVITE) tablet Take 1 tablet by mouth every morning.      . calcium-vitamin D (OSCAL WITH D) 500-200 MG-UNIT per tablet Take 1 tablet by mouth every morning.      . fish  oil-omega-3 fatty acids 1000 MG capsule Take 1 g by mouth every morning.       Marland Kitchen levothyroxine (SYNTHROID, LEVOTHROID) 112 MCG tablet Take 112 mcg by mouth every morning.       . Multiple Vitamin (MULTIVITAMIN) tablet Take 1 tablet by mouth every morning.       Marland Kitchen omeprazole (PRILOSEC) 20 MG capsule Take 20 mg by mouth daily as needed (heartburn/GERD).        Scheduled: . aspirin  81 mg Oral Daily  . atenolol  50 mg Oral QHS  . [START ON 10/25/2013] beta carotene w/minerals  1 tablet Oral Daily  . [START ON 10/25/2013] calcium-vitamin D  1 tablet Oral Q breakfast  . enoxaparin (LOVENOX) injection  40 mg Subcutaneous Q24H  . [START ON 10/25/2013] levothyroxine  112 mcg Oral QAC breakfast  . [START ON 10/25/2013] multivitamin with minerals  1 tablet Oral Daily  . [START ON 10/25/2013] omega-3 acid ethyl esters  1 g Oral BID  . pantoprazole  40 mg Oral Daily  . sodium chloride  3 mL Intravenous Q12H    ROS: History obtained from the patient  General ROS: negative for - chills, fatigue, fever, night sweats, weight gain or weight loss Psychological ROS: negative for - behavioral disorder, hallucinations, memory difficulties, mood swings or suicidal ideation Ophthalmic ROS: as noted in HPI ENT ROS: negative for -  epistaxis, nasal discharge, oral lesions, sore throat, tinnitus or vertigo Allergy and Immunology ROS: negative for - hives or itchy/watery eyes Hematological and Lymphatic ROS: negative for - bleeding problems, bruising or swollen lymph nodes Endocrine ROS: negative for - galactorrhea, hair pattern changes, polydipsia/polyuria or temperature intolerance Respiratory ROS: negative for - cough, hemoptysis, shortness of breath or wheezing Cardiovascular ROS: negative for - chest pain, dyspnea on exertion, edema or irregular heartbeat Gastrointestinal ROS: negative for - abdominal pain, diarrhea, hematemesis, nausea/vomiting or stool incontinence Genito-Urinary ROS: negative for - dysuria,  hematuria, incontinence or urinary frequency/urgency Musculoskeletal ROS: negative for - joint swelling or muscular weakness Neurological ROS: as noted in HPI Dermatological ROS: negative for rash and skin lesion changes  Physical Examination: Blood pressure 176/75, pulse 69, temperature 97.6 F (36.4 C), temperature source Oral, resp. rate 17, SpO2 99.00%.  Neurologic Examination: Mental Status: Alert, oriented, thought content appropriate.  Speech fluent without evidence of aphasia.  Able to follow 3 step commands without difficulty. Cranial Nerves: II: Discs flat bilaterally; Visual fields grossly normal OD with no vision reported OS, pupils dilated.  Minimally reactive on the right and unreactive on the left III,IV, VI: ptosis not present, extra-ocular motions intact bilaterally V,VII: smile symmetric, facial light touch sensation normal bilaterally VIII: hearing normal bilaterally IX,X: gag reflex present XI: bilateral shoulder shrug XII: midline tongue extension Motor: Right : Upper extremity   5/5    Left:     Upper extremity   5/5  Lower extremity   5/5     Lower extremity   5/5 Tone and bulk:normal tone throughout; no atrophy noted Sensory: Pinprick and light touch intact throughout, bilaterally Deep Tendon Reflexes: 2+ in the upper extremities, 1+ at the knees and absent at the ankles.   Plantars: Right: downgoing   Left: downgoing Cerebellar: normal finger-to-nose and normal heel-to-shin test Gait: Unable to test due to safety CV: pulses palpable throughout     Laboratory Studies:  Basic Metabolic Panel:  Recent Labs Lab 10/24/13 1324  NA 140  K 4.3  CL 101  CO2 27  GLUCOSE 90  BUN 16  CREATININE 0.66  CALCIUM 9.5    Liver Function Tests: No results found for this basename: AST, ALT, ALKPHOS, BILITOT, PROT, ALBUMIN,  in the last 168 hours No results found for this basename: LIPASE, AMYLASE,  in the last 168 hours No results found for this basename:  AMMONIA,  in the last 168 hours  CBC:  Recent Labs Lab 10/24/13 1324  WBC 6.3  HGB 13.1  HCT 39.4  MCV 85.8  PLT 182    Cardiac Enzymes: No results found for this basename: CKTOTAL, CKMB, CKMBINDEX, TROPONINI,  in the last 168 hours  BNP: No components found with this basename: POCBNP,   CBG: No results found for this basename: GLUCAP,  in the last 168 hours  Microbiology: No results found for this or any previous visit.  Coagulation Studies:  Recent Labs  10/24/13 1324  LABPROT 13.1  INR 1.01    Urinalysis: No results found for this basename: COLORURINE, APPERANCEUR, LABSPEC, PHURINE, GLUCOSEU, HGBUR, BILIRUBINUR, KETONESUR, PROTEINUR, UROBILINOGEN, NITRITE, LEUKOCYTESUR,  in the last 168 hours  Lipid Panel:    Component Value Date/Time   CHOL  Value: 171        ATP III CLASSIFICATION:  <200     mg/dL   Desirable  200-239  mg/dL   Borderline High  >=240    mg/dL   High  12/21/2009 1057   TRIG 237* 12/21/2009 1057   HDL 42 12/21/2009 1057   CHOLHDL 4.1 12/21/2009 1057   VLDL 47* 12/21/2009 1057   LDLCALC  Value: 82        Total Cholesterol/HDL:CHD Risk Coronary Heart Disease Risk Table                     Men   Women  1/2 Average Risk   3.4   3.3  Average Risk       5.0   4.4  2 X Average Risk   9.6   7.1  3 X Average Risk  23.4   11.0        Use the calculated Patient Ratio above and the CHD Risk Table to determine the patient's CHD Risk.        ATP III CLASSIFICATION (LDL):  <100     mg/dL   Optimal  100-129  mg/dL   Near or Above                    Optimal  130-159  mg/dL   Borderline  160-189  mg/dL   High  >190     mg/dL   Very High 12/21/2009 1057    HgbA1C:  No results found for this basename: HGBA1C    Urine Drug Screen:   No results found for this basename: labopia, cocainscrnur, labbenz, amphetmu, thcu, labbarb    Alcohol Level: No results found for this basename: ETH,  in the last 168 hours  Other results: EKG: sinus rhythm at 62  bpm.  Imaging: Ct Head Wo Contrast  10/24/2013   CLINICAL DATA:  Vision loss.  EXAM: CT HEAD WITHOUT CONTRAST  TECHNIQUE: Contiguous axial images were obtained from the base of the skull through the vertex without intravenous contrast.  COMPARISON:  MRI scan of March 02, 2013; CT scan of February 08, 2010.  FINDINGS: Bony calvarium appears intact. No midline shift is noted. Minimal chronic ischemic white matter disease is noted. 20 x 13 mm density is seen in left suprasellar region consistent with meningioma seen on prior exam. No midline shift is noted. Ventricular size is within normal limits. No evidence of acute infarction or hemorrhage is noted.  IMPRESSION: Left suprasellar mass is again noted and consistent with meningioma seen on prior MRI exam. Minimal chronic ischemic white matter disease is noted. No other significant intracranial abnormality seen.   Electronically Signed   By: Sabino Dick M.D.   On: 10/24/2013 13:46    Assessment: 80 y.o. female presenting after loss of vision on the left eye.  No additional neurologic findings on examination.  Head CT reviewed and shows no acute changes.  Patient felt to have a central retinal artery occlusion and admitted for a stroke work up.. Carotid dopplers performed and show no hemodynamically significant stenosis.  Patient on no antiplatelet therapy at home.    Stroke Risk Factors - hyperlipidemia and hypertension  Plan: 1. HgbA1c, fasting lipid panel 2. MRI, MRA  of the brain without contrast 3. PT consult, OT consult 4. Echocardiogram 5. Prophylactic therapy-Antiplatelet med: Aspirin - dose 325mg  daily 6. Telemetry monitoring 7. Frequent neuro checks   Alexis Goodell, MD [facility] [phone] 10/24/2013, 8:37 PM

## 2013-10-24 NOTE — ED Notes (Signed)
Pt presents after being referred by Dr. Laurence Aly. Dr Ronnald Ramp writes: "Sudden painless loss of central vision OS. No afferent pupil defect. Dilated fundus examination revealed normal retina + optic nerve. No papilledema. No unilateral weakness. 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 VA- hand motion in temporal field. R/O: CVA." Pt ambulates to exam room unassisted with a steady gait without difficulty or weakness noted.

## 2013-10-24 NOTE — Consult Note (Signed)
Reason for consult:  Painless Vision loss OS  HPI: Melissa Snyder is an 80 y.o. female who woke up with "scrambled vision" with her left eye this morning upon waking (around 8 am).  No Headache, nausea or other associated symptoms.  "I haven't felt any different except that I cannot see out of my left eye."  SHe called her optometrist (Dr. Laurence Aly at [facility]) who agreed to see her at noon.  On the way to [facility], she noted that the vision went out completely OS.  No pain.  No problems OD.  Dr. Ronnald Ramp "didn't find anything wrong, so he sent me to the ER."  The patient's left eye was dilated by Dr Ronnald Ramp.  Her sed rate was checked in ER.  It is within normal limits.  POH:  Cataracts  Family ocular history: mother had glaucoma  Past Medical History  Diagnosis Date  . Chest pain   . GERD (gastroesophageal reflux disease)   . Bundle branch block left   . Hyperlipidemia   . Hypertension   . Hypothyroid   . Migraine    Past Surgical History  Procedure Laterality Date  . Cardiac catheterization     No family history on file. No current facility-administered medications for this encounter.   Current Outpatient Prescriptions  Medication Sig Dispense Refill  . atenolol (TENORMIN) 50 MG tablet Take 50 mg by mouth daily.      . beta carotene w/minerals (OCUVITE) tablet Take 1 tablet by mouth every morning.      . calcium-vitamin D (OSCAL WITH D) 500-200 MG-UNIT per tablet Take 1 tablet by mouth every morning.      . fish oil-omega-3 fatty acids 1000 MG capsule Take 1 g by mouth every morning.       Marland Kitchen levothyroxine (SYNTHROID, LEVOTHROID) 112 MCG tablet Take 112 mcg by mouth every morning.       . Multiple Vitamin (MULTIVITAMIN) tablet Take 1 tablet by mouth every morning.       Marland Kitchen omeprazole (PRILOSEC) 20 MG capsule Take 20 mg by mouth daily as needed (heartburn/GERD).        Allergies  Allergen Reactions  . Ibuprofen Other (See Comments)    Causes mouth sores  . Tramadol  Itching  . Niacin And Related Other (See Comments)    Flushing, burning, redness   History   Social History  . Marital Status: Married    Spouse Name: N/A    Number of Children: N/A  . Years of Education: N/A   Occupational History  . Not on file.   Social History Main Topics  . Smoking status: Never Smoker   . Smokeless tobacco: Never Used  . Alcohol Use: No  . Drug Use: No  . Sexual Activity: Not on file   Other Topics Concern  . Not on file   Social History Narrative  . No narrative on file    Review of systems: ROS  General:  No fever or chills.  Pulm: no SOB, CV: "my cholesterol is controlled of medicines".  No chest pain.  Derm: no rash, GI: no nausea or vomitting, MS: no aches or pains,   Neuro: No HA, no diplopia, HEENT: no sinus issues, Heme Onc: no history of cancer  Physical Exam:  Blood pressure 170/67, pulse 63, temperature 97.6 F (36.4 C), temperature source Oral, resp. rate 22, SpO2 98.00%.   VA cc (trifocals):  OD 20/40  OS  Bare light perception  Pupils:  OD round, reactive to light, no APD            OS pharmacologically dilated already, + APD by reverse  IOP (T pen)  OD 16   OS  15  CVF: OD full to CF   OS: global restriction  Motility:  OD full ductions  OS full ductions  Balance/alignment:  Ortho by Luiz Ochoa   Slit lamp examination:                                 OD                                       External/adnexa: Normal                                      Lids/lashes:        Normal                                      Conjunctiva        White, quiet        Cornea:              Clear                  AC:                     Deep, quiet                                Iris:                     Normal        Lens:                  Moderate NS Cataract                                     OS                                       External/adnexa: Normal                                      Lids/lashes:        Normal                                       Conjunctiva        White, quiet        Cornea:              Clear                  AC:  Deep, quiet                                Iris:                     Normal        Lens:                  Moderate NS Cataract       Dilated fundus exam: (Neo 2.5; Myd 1%)      OD Vitreous            Clear, quiet                                Optic Disc:       Normal, perfused 0.5   C:D                  Macula:             Flat                                            Vessels:           Normal caliber,distribution         Periphery:         Flat, attached                                      OS Vitreous            Clear, quiet                                Optic Disc:        Slightly pale, 0.5 C:D                    Macula:             Subtle cherry red spot surrounded by mild superficial whitening                                          Vessels:           attenuated         Periphery:         Flat, attached        Labs/studies: Results for orders placed during the hospital encounter of 10/24/13 (from the past 48 hour(s))  CBC     Status: None   Collection Time    10/24/13  1:24 PM      Result Value Ref Range   WBC 6.3  4.0 - 10.5 K/uL   RBC 4.59  3.87 - 5.11 MIL/uL   Hemoglobin 13.1  12.0 - 15.0 g/dL   HCT 39.4  36.0 - 46.0 %   MCV 85.8  78.0 - 100.0 fL   MCH 28.5  26.0 - 34.0 pg   MCHC 33.2  30.0 - 36.0 g/dL   RDW 13.5  11.5 - 15.5 %  Platelets 182  150 - 400 K/uL  BASIC METABOLIC PANEL     Status: Abnormal   Collection Time    10/24/13  1:24 PM      Result Value Ref Range   Sodium 140  137 - 147 mEq/L   Potassium 4.3  3.7 - 5.3 mEq/L   Chloride 101  96 - 112 mEq/L   CO2 27  19 - 32 mEq/L   Glucose, Bld 90  70 - 99 mg/dL   BUN 16  6 - 23 mg/dL   Creatinine, Ser 0.66  0.50 - 1.10 mg/dL   Calcium 9.5  8.4 - 10.5 mg/dL   GFR calc non Af Amer 82 (*) >90 mL/min   GFR calc Af Amer >90  >90 mL/min   Comment: (NOTE)     The eGFR has  been calculated using the CKD EPI equation.     This calculation has not been validated in all clinical situations.     eGFR's persistently <90 mL/min signify possible Chronic Kidney     Disease.  PROTIME-INR     Status: None   Collection Time    10/24/13  1:24 PM      Result Value Ref Range   Prothrombin Time 13.1  11.6 - 15.2 seconds   INR 1.01  0.00 - 1.49  SEDIMENTATION RATE     Status: None   Collection Time    10/24/13  1:24 PM      Result Value Ref Range   Sed Rate 15  0 - 22 mm/hr   Ct Head Wo Contrast  10/24/2013   CLINICAL DATA:  Vision loss.  EXAM: CT HEAD WITHOUT CONTRAST  TECHNIQUE: Contiguous axial images were obtained from the base of the skull through the vertex without intravenous contrast.  COMPARISON:  MRI scan of March 02, 2013; CT scan of February 08, 2010.  FINDINGS: Bony calvarium appears intact. No midline shift is noted. Minimal chronic ischemic white matter disease is noted. 20 x 13 mm density is seen in left suprasellar region consistent with meningioma seen on prior exam. No midline shift is noted. Ventricular size is within normal limits. No evidence of acute infarction or hemorrhage is noted.  IMPRESSION: Left suprasellar mass is again noted and consistent with meningioma seen on prior MRI exam. Minimal chronic ischemic white matter disease is noted. No other significant intracranial abnormality seen.   Electronically Signed   By: Sabino Dick M.D.   On: 10/24/2013 13:46                             Assessment and Plan: Central retinal arterial occlusion.  I discussed the possibility of AC tap to lower IOP, but I have very little hope that this will help because it started at 8:00 am.  Needs to be worked up as a CVA patient (carotid dopplers, etc).  Discussed with ER attending (Dr. Roderic Palau.)  Too far out for thrombolytics.  Dr. Roderic Palau to call hospitalist for CVA admission.  Poor prognosis discussed with patient.  Wear glasses at all times to protect good eye.   Call me on Tuesday to set up follow up in  One month to watch for neovacularization that could lead to angle closure glaucoma. Need to maximize cardiovascular health to preserve her remaining eye.  I will send a report to Dr Joylene Draft (PCP) on Tuesday.     All of the above information was relayed to the  patient and/or patient family.  Ophthalmic warning signs and symptoms were reviewed, and clear instructions for immediate phone contact and/or immediate return to the ED or clinic were provided should any of these signs or symptoms occur.  Follow up contact information was provided.  All questions were answered.   Roselie Awkward 10/24/2013, 4:18 PM  [facility] [phone]

## 2013-10-24 NOTE — Progress Notes (Signed)
VASCULAR LAB PRELIMINARY  PRELIMINARY  PRELIMINARY  PRELIMINARY  Carotid Dopplers completed.    Preliminary report:  There is 1-39% ICA stenosis.  Vertebral artery flow is antegrade.   Iantha Fallen, RVT 10/24/2013, 5:07 PM

## 2013-10-24 NOTE — ED Notes (Signed)
Patient transported to CT 

## 2013-10-24 NOTE — H&P (Signed)
[facility]          History and Physical    PCP:   No primary provider on file.   Chief Complaint:  Loss of vision left eye  HPI:  patient is a very pleasant 80 year old woman with past medical history significant for hypertension, hypothyroidism, GERD who woke up this morning at a few minutes before 8 o'clock and noticed flashes of light out of her left eye. Within about 30 minutes she had complete loss of vision out of her left eye. She called her optometrist who saw today at noon. Per her report, he did a comprehensive eye exam he was not able to find any issues and sent her to the emergency department. Upon arrival to the ED an ophthalmology consult was requested and  the diagnosis of central retinal artery occlusion of the left eye was made. It was by then too late in the course to administer thrombolytics. Ophthalmologist has recommended admission to the hospital for a stroke workup. Patient denies any symptoms other than loss of vision: No weakness, numbness, difficulty swallowing or with speech. Hospitalist admission was requested.  Allergies:   Allergies  Allergen Reactions  . Ibuprofen Other (See Comments)    Causes mouth sores  . Tramadol Itching  . Niacin And Related Other (See Comments)    Flushing, burning, redness      Past Medical History  Diagnosis Date  . Chest pain   . GERD (gastroesophageal reflux disease)   . Bundle branch block left   . Hyperlipidemia   . Hypertension   . Hypothyroid   . Migraine     Past Surgical History  Procedure Laterality Date  . Cardiac catheterization      Prior to Admission medications   Medication Sig Start Date End Date Taking? Authorizing Provider  atenolol (TENORMIN) 50 MG tablet Take 50 mg by mouth daily.   Yes Historical Provider, MD  beta carotene w/minerals (OCUVITE) tablet Take 1 tablet by mouth every morning.   Yes Historical Provider, MD  calcium-vitamin D (OSCAL WITH D) 500-200 MG-UNIT  per tablet Take 1 tablet by mouth every morning.   Yes Historical Provider, MD  fish oil-omega-3 fatty acids 1000 MG capsule Take 1 g by mouth every morning.    Yes Historical Provider, MD  levothyroxine (SYNTHROID, LEVOTHROID) 112 MCG tablet Take 112 mcg by mouth every morning.    Yes Historical Provider, MD  Multiple Vitamin (MULTIVITAMIN) tablet Take 1 tablet by mouth every morning.    Yes Historical Provider, MD  omeprazole (PRILOSEC) 20 MG capsule Take 20 mg by mouth daily as needed (heartburn/GERD).    Yes Historical Provider, MD    Social History:  reports that she has never smoked. She has never used smokeless tobacco. She reports that she does not drink alcohol or use illicit drugs.  No family history on file.  Review of Systems:  Constitutional: Denies fever, chills, diaphoresis, appetite change and fatigue.  HEENT: Denies photophobia, eye pain, redness, hearing loss, ear pain, congestion, sore throat, rhinorrhea, sneezing, mouth sores, trouble swallowing, neck pain, neck stiffness and tinnitus.   Respiratory: Denies SOB, DOE, cough, chest tightness,  and wheezing.   Cardiovascular: Denies chest pain, palpitations and leg swelling.  Gastrointestinal: Denies nausea, vomiting, abdominal pain, diarrhea, constipation, blood in stool and abdominal distention.  Genitourinary: Denies dysuria, urgency, frequency, hematuria, flank pain and difficulty urinating.  Endocrine: Denies: hot or cold  intolerance, sweats, changes in hair or nails, polyuria, polydipsia. Musculoskeletal: Denies myalgias, back pain, joint swelling, arthralgias and gait problem.  Skin: Denies pallor, rash and wound.  Neurological: Denies dizziness, seizures, syncope, weakness, light-headedness, numbness and headaches.  Hematological: Denies adenopathy. Easy bruising, personal or family bleeding history  Psychiatric/Behavioral: Denies suicidal ideation, mood changes, confusion, nervousness, sleep disturbance and  agitation   Physical Exam: Blood pressure 170/68, pulse 60, temperature 97.6 F (36.4 C), temperature source Oral, resp. rate 16, SpO2 99.00%. General: Alert, awake, oriented x3, in no distress, pleasant cooperative with exam. HEENT: Normocephalic, atraumatic, pupils are fixed and dilated, wears corrective lenses, no pharyngeal erythema, moist mucous membranes. Neck: Supple, no JVD, no lymphadenopathy, no bruits, no goiter. Cardiovascular: Regular rate and rhythm, no murmurs, rubs or gallops. Lungs: Clear to auscultation bilaterally. Abdomen: Soft, nontender, nondistended, positive bowel sounds, no masses or organomegaly noted. Extremities: No clubbing, cyanosis or edema, positive pedal pulses. Neurologic: Cranial nerves intact other than complete loss of vision in left eye, mental status intact, muscular strength 5 out of 5 bilateral symmetrical, DTRs 2+ symmetric, sensation intact to light touch, plantar reflex downgoing bilaterally, finger to nose normal.  Labs on Admission:  Results for orders placed during the hospital encounter of 10/24/13 (from the past 48 hour(s))  CBC     Status: None   Collection Time    10/24/13  1:24 PM      Result Value Ref Range   WBC 6.3  4.0 - 10.5 K/uL   RBC 4.59  3.87 - 5.11 MIL/uL   Hemoglobin 13.1  12.0 - 15.0 g/dL   HCT 39.4  36.0 - 46.0 %   MCV 85.8  78.0 - 100.0 fL   MCH 28.5  26.0 - 34.0 pg   MCHC 33.2  30.0 - 36.0 g/dL   RDW 13.5  11.5 - 15.5 %   Platelets 182  150 - 400 K/uL  BASIC METABOLIC PANEL     Status: Abnormal   Collection Time    10/24/13  1:24 PM      Result Value Ref Range   Sodium 140  137 - 147 mEq/L   Potassium 4.3  3.7 - 5.3 mEq/L   Chloride 101  96 - 112 mEq/L   CO2 27  19 - 32 mEq/L   Glucose, Bld 90  70 - 99 mg/dL   BUN 16  6 - 23 mg/dL   Creatinine, Ser 0.66  0.50 - 1.10 mg/dL   Calcium 9.5  8.4 - 10.5 mg/dL   GFR calc non Af Amer 82 (*) >90 mL/min   GFR calc Af Amer >90  >90 mL/min   Comment: (NOTE)     The  eGFR has been calculated using the CKD EPI equation.     This calculation has not been validated in all clinical situations.     eGFR's persistently <90 mL/min signify possible Chronic Kidney     Disease.  PROTIME-INR     Status: None   Collection Time    10/24/13  1:24 PM      Result Value Ref Range   Prothrombin Time 13.1  11.6 - 15.2 seconds   INR 1.01  0.00 - 1.49  SEDIMENTATION RATE     Status: None   Collection Time    10/24/13  1:24 PM      Result Value Ref Range   Sed Rate 15  0 - 22 mm/hr    Radiological Exams on Admission: Ct Head Wo Contrast  10/24/2013   CLINICAL DATA:  Vision loss.  EXAM: CT HEAD WITHOUT CONTRAST  TECHNIQUE: Contiguous axial images were obtained from the base of the skull through the vertex without intravenous contrast.  COMPARISON:  MRI scan of March 02, 2013; CT scan of February 08, 2010.  FINDINGS: Bony calvarium appears intact. No midline shift is noted. Minimal chronic ischemic white matter disease is noted. 20 x 13 mm density is seen in left suprasellar region consistent with meningioma seen on prior exam. No midline shift is noted. Ventricular size is within normal limits. No evidence of acute infarction or hemorrhage is noted.  IMPRESSION: Left suprasellar mass is again noted and consistent with meningioma seen on prior MRI exam. Minimal chronic ischemic white matter disease is noted. No other significant intracranial abnormality seen.   Electronically Signed   By: Sabino Dick M.D.   On: 10/24/2013 13:46    Assessment/Plan Principal Problem:   Central retinal artery occlusion of left eye Active Problems:   HTN (hypertension)   Hypothyroidism   GERD (gastroesophageal reflux disease)    Central retinal artery occlusion of the left eye  -Has already been evaluated by ophthalmology. They believe she has lost vision permanently out of her left eye. -Patient will followup with Dr.McCuen in the outpatient setting. -Ophthalmology is recommending  admission for stroke workup. -MRI of the brain, 2-D echo, carotid Dopplers, cholesterol will be ordered. -Will be started on aspirin 81 mg daily. -Patient has been discussed with Dr. Armida Sans with neurology who will see the patient upon arrival to [facility].  Hypertension -Continue home medications.  Hypothyroidism -Check TSH. -Continue home dose of Synthroid.  GERD -Continue PPI while in the hospital.  DVT prophylaxis -Lovenox  CODE STATUS -Full code   Time Spent on Admission: 80 minutes  [facility] Pager: [phone] 10/24/2013, 6:09 PM

## 2013-10-24 NOTE — ED Provider Notes (Signed)
CSN: [id number]     Arrival date & time 10/24/13  1244 History   First MD Initiated Contact with Patient 10/24/13 1315     Chief Complaint  Patient presents with  . Loss of Vision     (Consider location/radiation/quality/duration/timing/severity/associated sxs/prior Treatment) HPI Pt presenting with c/o visual loss in left eye.  Pt states she woke up this morning and symptoms were present- she noticed light and scrambled vision in her left eye.  She states she then closed her eye and didn't open it until on the way to see her optometrist.  On the way she noted complete loss of vision from her left eye.  No pain.  No headache,  No preceding symptoms.  No cahnges in speech, n weakness of arms or legs.  No truama to eye.  Optometrist referred her to the ED for further workup.  There are no other associated systemic symptoms, there are no other alleviating or modifying factors.   Past Medical History  Diagnosis Date  . Chest pain   . GERD (gastroesophageal reflux disease)   . Bundle branch block left   . Hyperlipidemia   . Hypertension   . Hypothyroid   . Migraine    Past Surgical History  Procedure Laterality Date  . Cardiac catheterization     No family history on file. History  Substance Use Topics  . Smoking status: Never Smoker   . Smokeless tobacco: Never Used  . Alcohol Use: No   OB History   Grav Para Term Preterm Abortions TAB SAB Ect Mult Living                 Review of Systems ROS reviewed and all otherwise negative except for mentioned in HPI    Allergies  Ibuprofen; Tramadol; and Niacin and related  Home Medications   Prior to Admission medications   Medication Sig Start Date End Date Taking? Authorizing Provider  atenolol (TENORMIN) 50 MG tablet Take 50 mg by mouth daily.   Yes Historical Provider, MD  beta carotene w/minerals (OCUVITE) tablet Take 1 tablet by mouth every morning.   Yes Historical Provider, MD  calcium-vitamin D (OSCAL WITH D) 500-200  MG-UNIT per tablet Take 1 tablet by mouth every morning.   Yes Historical Provider, MD  fish oil-omega-3 fatty acids 1000 MG capsule Take 1 g by mouth every morning.    Yes Historical Provider, MD  levothyroxine (SYNTHROID, LEVOTHROID) 112 MCG tablet Take 112 mcg by mouth every morning.    Yes Historical Provider, MD  Multiple Vitamin (MULTIVITAMIN) tablet Take 1 tablet by mouth every morning.    Yes Historical Provider, MD  omeprazole (PRILOSEC) 20 MG capsule Take 20 mg by mouth daily as needed (heartburn/GERD).    Yes Historical Provider, MD   BP 162/77  Pulse 58  Temp(Src) 98.2 F (36.8 C) (Oral)  Resp 17  SpO2 95% Vitals reviewed Physical Exam Physical Examination: General appearance - alert, well appearing, and in no distress Mental status - alert, oriented to person, place, and time Eyes - left pupil pharmacologically dilated and not responsive- no APD noted from optometry note.  Pt able to sense light only, no colors or shapes from OS, no conjunctival injection Mouth - mucous membranes moist, pharynx normal without lesions Chest - clear to auscultation, no wheezes, rales or rhonchi, symmetric air entry Heart - normal rate, regular rhythm, normal S1, S2, no murmurs, rubs, clicks or gallops Abdomen - soft, nontender, nondistended, no masses or organomegaly Neurological -  alert, oriented x 3, normal speech, other than vision, 2-12 cranial nerves tested and intact, sensation intact Extremities - peripheral pulses normal, no pedal edema, no clubbing or cyanosis Skin - normal coloration and turgor, no rashes  ED Course  Procedures (including critical care time)  3:49 PM d/w Dr. Ellie Lunch, optho.  She is coming to the ED to evaluated patient.  I have asked nursing to place slit lamp in the room.  She also requests carotid dopplers to be ordered, which I have done.  Pt updated about plan and findings thus far.  Pt will be signed out to Dr. Roderic Palau pending remainder of her workup.  Labs  Review Labs Reviewed  BASIC METABOLIC PANEL - Abnormal; Notable for the following:    GFR calc non Af Amer 82 (*)    All other components within normal limits  C-REACTIVE PROTEIN - Abnormal; Notable for the following:    CRP <0.5 (*)    All other components within normal limits  CBC - Abnormal; Notable for the following:    Hemoglobin 11.9 (*)    HCT 35.6 (*)    All other components within normal limits  CREATININE, SERUM - Abnormal; Notable for the following:    GFR calc non Af Amer 65 (*)    GFR calc Af Amer 75 (*)    All other components within normal limits  CBC  PROTIME-INR  SEDIMENTATION RATE  TSH  HEMOGLOBIN A1C  LIPID PANEL    Imaging Review Ct Head Wo Contrast  10/24/2013   CLINICAL DATA:  Vision loss.  EXAM: CT HEAD WITHOUT CONTRAST  TECHNIQUE: Contiguous axial images were obtained from the base of the skull through the vertex without intravenous contrast.  COMPARISON:  MRI scan of March 02, 2013; CT scan of February 08, 2010.  FINDINGS: Bony calvarium appears intact. No midline shift is noted. Minimal chronic ischemic white matter disease is noted. 20 x 13 mm density is seen in left suprasellar region consistent with meningioma seen on prior exam. No midline shift is noted. Ventricular size is within normal limits. No evidence of acute infarction or hemorrhage is noted.  IMPRESSION: Left suprasellar mass is again noted and consistent with meningioma seen on prior MRI exam. Minimal chronic ischemic white matter disease is noted. No other significant intracranial abnormality seen.   Electronically Signed   By: Sabino Dick M.D.   On: 10/24/2013 13:46     EKG Interpretation   Date/Time:  Saturday Oct 24 2013 13:43:19 EDT Ventricular Rate:  62 PR Interval:  201 QRS Duration: 168 QT Interval:  519 QTC Calculation: 527 R Axis:   -10 Text Interpretation:  Sinus rhythm Left bundle branch block No significant  change since last tracing Confirmed by [facility]  MD, Danaly Bari  [phone]) on  10/24/2013 1:51:07 PM      MDM   Final diagnoses:  Central retinal artery occlusion of left eye  GERD (gastroesophageal reflux disease)  HTN (hypertension)  Hypothyroidism    Pt with painless loss of vision in left eye this morning, present upon awakening from sleep.  Ct reassuring- meningioma that was seen prior, optho consulted and will see patient in the ED.  EKG reassuring- no afib or changes.  Carotid dopplers ordered.  ESR 15 making giant cell arteritis less likely.  May be ischemic ocular neuropathy versus central retinal artery occlusion.  Pt signed out to Dr. Roderic Palau pending optho eval, carotid dopplers and definitive plan.  Pt updated about plan  Threasa Beards, MD 10/25/13 [phone]

## 2013-10-24 NOTE — Progress Notes (Signed)
Report received from [place] at [place] [facility] ED at [place] and pt arrived to the unit at 1900. Pt A&O x4, pt oriented to the unit and call light, VS taken and pt call light within reach. Report given to incoming RN. Francis Gaines Duong Haydel RN.

## 2013-10-25 ENCOUNTER — Inpatient Hospital Stay (HOSPITAL_COMMUNITY): Payer: Medicare HMO

## 2013-10-25 DIAGNOSIS — I359 Nonrheumatic aortic valve disorder, unspecified: Secondary | ICD-10-CM

## 2013-10-25 LAB — HEMOGLOBIN A1C
Hgb A1c MFr Bld: 5.9 % — ABNORMAL HIGH (ref <5.7)
Mean Plasma Glucose: 123 mg/dL — ABNORMAL HIGH (ref <117)

## 2013-10-25 LAB — LIPID PANEL
CHOL/HDL RATIO: 6.1 ratio
CHOLESTEROL: 226 mg/dL — AB (ref 0–200)
HDL: 37 mg/dL — AB (ref >39)
LDL Cholesterol: 147 mg/dL — ABNORMAL HIGH (ref 0–99)
TRIGLYCERIDES: 208 mg/dL — AB (ref <150)
VLDL: 42 mg/dL — AB (ref 0–40)

## 2013-10-25 MED ORDER — ATORVASTATIN CALCIUM 20 MG PO TABS
20.0000 mg | ORAL_TABLET | Freq: Every day | ORAL | Status: DC
  Filled 2013-10-25 (×2): qty 1

## 2013-10-25 MED ORDER — STROKE: EARLY STAGES OF RECOVERY BOOK
Freq: Once | Status: AC
  Administered 2013-10-25: 11:00:00
  Filled 2013-10-25: qty 1

## 2013-10-25 MED ORDER — GADOBENATE DIMEGLUMINE 529 MG/ML IV SOLN
15.0000 mL | Freq: Once | INTRAVENOUS | Status: AC | PRN
  Administered 2013-10-25: 15 mL via INTRAVENOUS

## 2013-10-25 NOTE — Progress Notes (Signed)
Patient Demographics  Melissa Snyder, is a 80 y.o. female, DOB - 12-26-1933, TKW:[id number]  Admit date - 10/24/2013   Admitting Physician Erline Hau, MD  Outpatient Primary MD for the patient is No primary provider on file.  LOS - 1   Chief Complaint  Patient presents with  . Loss of Vision           Subjective:   Melissa Snyder today has, No headache, No chest pain, No abdominal pain - No Nausea, No new weakness tingling or numbness, No Cough - SOB. Near total loss of vision in left eye.    Assessment & Plan    1. Acute loss of vision in the left eye due to  ? left Central retinal artery occlusion (CVA) vs compression by meningioma - on aspirin and statin for secondary prevention, seen by ophthalmology in the office on 10/24/2013, neuro following, noted MRI MRA we'll discuss with neurosurgery as well, continue to monitor on telemetry, complete stroke workup with A1c, echogram, stable carotids. LDL greater than 100 placed on statin.    2. HTN - stable on atenolol.     3. Dyslipidemia. Started on statin.     4. Hypothyroidism. Continue home dose Synthroid.     5.GERD . Continue PPI no acute issues.      Code Status: Full  Family Communication: None present  Disposition Plan: Home   Procedures CT head, MRI MRA brain, echo, carotid   Consults  Neuro, seen by ophthalmology in the clinic on 10/24/2013, we'll discuss with neurosurgery have paged Dr Rita Ohara at 11 am on 10-25-13   Medications  Scheduled Meds: . aspirin  81 mg Oral Daily  . atenolol  50 mg Oral QHS  . atorvastatin  20 mg Oral q1800  . beta carotene w/minerals  1 tablet Oral Daily  . calcium-vitamin D  1 tablet Oral Q breakfast  . enoxaparin (LOVENOX) injection  40 mg Subcutaneous Q24H    . levothyroxine  112 mcg Oral QAC breakfast  . multivitamin with minerals  1 tablet Oral Daily  . omega-3 acid ethyl esters  1 g Oral BID  . pantoprazole  40 mg Oral Daily  . sodium chloride  3 mL Intravenous Q12H   Continuous Infusions:  PRN Meds:.sodium chloride, acetaminophen, acetaminophen, senna-docusate, sodium chloride  DVT Prophylaxis  Lovenox   Lab Results  Component Value Date   PLT 156 10/24/2013    Antibiotics     Anti-infectives   None          Objective:   Filed Vitals:   10/25/13 0100 10/25/13 0300 10/25/13 0500 10/25/13 0804  BP: 145/64 144/65 162/77   Pulse: 57 60 58   Temp: 98.4 F (36.9 C) 98.4 F (36.9 C) 98.2 F (36.8 C)   TempSrc: Oral Oral Oral   Resp: 16 16 17    Height:    5\' 6"  (1.676 m)  Weight:    68.04 kg (150 lb)  SpO2: 96% 96% 95%     Wt Readings from Last 3 Encounters:  10/25/13 68.04 kg (150 lb)     Intake/Output Summary (Last 24 hours) at 10/25/13 1052 Last data filed at 10/25/13 0825  Gross per 24 hour  Intake  363 ml  Output      0 ml  Net    363 ml     Physical Exam  Awake Alert, Oriented X 3, No new F.N deficits, Normal affect [place].AT, left pupil is unreactive to light, she has minimal vision in the left upper visual field where she can appreciate light only. Supple Neck,No JVD, No cervical lymphadenopathy appriciated.  Symmetrical Chest wall movement, Good air movement bilaterally, CTAB RRR,No Gallops,Rubs or new Murmurs, No Parasternal Heave +ve B.Sounds, Abd Soft, No tenderness, No organomegaly appriciated, No rebound - guarding or rigidity. No Cyanosis, Clubbing or edema, No new Rash or bruise      Data Review   Micro Results No results found for this or any previous visit (from the past 240 hour(s)).  Radiology Reports  Carotids  Preliminary report: There is 1-39% ICA stenosis. Vertebral artery flow is antegrade.    Ct Head Wo Contrast  10/24/2013   CLINICAL DATA:  Vision loss.  EXAM: CT HEAD  WITHOUT CONTRAST  TECHNIQUE: Contiguous axial images were obtained from the base of the skull through the vertex without intravenous contrast.  COMPARISON:  MRI scan of March 02, 2013; CT scan of February 08, 2010.  FINDINGS: Bony calvarium appears intact. No midline shift is noted. Minimal chronic ischemic white matter disease is noted. 20 x 13 mm density is seen in left suprasellar region consistent with meningioma seen on prior exam. No midline shift is noted. Ventricular size is within normal limits. No evidence of acute infarction or hemorrhage is noted.  IMPRESSION: Left suprasellar mass is again noted and consistent with meningioma seen on prior MRI exam. Minimal chronic ischemic white matter disease is noted. No other significant intracranial abnormality seen.   Electronically Signed   By: Sabino Dick M.D.   On: 10/24/2013 13:46   Mr Brain Wo Contrast  10/25/2013   CLINICAL DATA:  Loss of vision left eye.  Known meningioma.  EXAM: MRI HEAD WITHOUT AND WITH CONTRAST  MRA HEAD WITHOUT CONTRAST  TECHNIQUE: Multiplanar, multiecho pulse sequences of the brain and surrounding structures were obtained without and with intravenous contrast. Angiographic images of the head were obtained using MRA technique without contrast.  CONTRAST:  64mL MULTIHANCE GADOBENATE DIMEGLUMINE 529 MG/ML IV SOLN  COMPARISON:  MRI head 03/02/2013  FINDINGS: MRI HEAD FINDINGS  Negative for acute infarct. Mild chronic microvascular ischemic change in the white matter bilaterally. Negative for intracranial hemorrhage.  Meningioma of the left planum sphenoidale extends back to the sella. This shows homogeneous intense enhancement. On axial images the meningioma measures 18 x 21 mm and is similar to the prior study. No invasion of the cavernous sinus. The meningioma is touching the optic chiasm and optic tract on the left, similar to the prior study.  6 mm meningioma on the right posterior clinoid. This is unchanged from the prior  study.  Right frontal plaque like meningioma measures approximately 4 mm in thickness and is similar to the prior study. No edema in the adjacent brain.  Ventricle size is normal.  No shift of the midline structures.  MRA HEAD FINDINGS  Left vertebral artery dominant and widely patent. Left PICA patent. Nondominant right vertebral artery ends in PICA. The basilar is widely patent. Superior cerebellar and posterior cerebral arteries are patent. Left posterior communicating artery is patent. Moderate stenosis in the left P2 segment. Right PCA widely patent with mild disease distally.  Internal carotid artery patent bilaterally. Anterior and middle cerebral arteries widely patent.  Negative for cerebral aneurysm.  IMPRESSION: Meningioma left planum sphenoidale is stable. No invasion of the cavernous sinus. Possible compression of the optic chiasm and optic tract on the right. Additional small meningioma right posterior clinoid also unchanged. Right frontal plaque like meningioma stable.  No acute infarct.  Moderate stenosis left PCA.  Mild disease distal right PCA.   Electronically Signed   By: Franchot Gallo M.D.   On: 10/25/2013 10:49   Mr Jodene Nam Head/brain Wo Cm  10/25/2013   CLINICAL DATA:  Loss of vision left eye.  Known meningioma.  EXAM: MRI HEAD WITHOUT AND WITH CONTRAST  MRA HEAD WITHOUT CONTRAST  TECHNIQUE: Multiplanar, multiecho pulse sequences of the brain and surrounding structures were obtained without and with intravenous contrast. Angiographic images of the head were obtained using MRA technique without contrast.  CONTRAST:  14mL MULTIHANCE GADOBENATE DIMEGLUMINE 529 MG/ML IV SOLN  COMPARISON:  MRI head 03/02/2013  FINDINGS: MRI HEAD FINDINGS  Negative for acute infarct. Mild chronic microvascular ischemic change in the white matter bilaterally. Negative for intracranial hemorrhage.  Meningioma of the left planum sphenoidale extends back to the sella. This shows homogeneous intense enhancement. On axial  images the meningioma measures 18 x 21 mm and is similar to the prior study. No invasion of the cavernous sinus. The meningioma is touching the optic chiasm and optic tract on the left, similar to the prior study.  6 mm meningioma on the right posterior clinoid. This is unchanged from the prior study.  Right frontal plaque like meningioma measures approximately 4 mm in thickness and is similar to the prior study. No edema in the adjacent brain.  Ventricle size is normal.  No shift of the midline structures.  MRA HEAD FINDINGS  Left vertebral artery dominant and widely patent. Left PICA patent. Nondominant right vertebral artery ends in PICA. The basilar is widely patent. Superior cerebellar and posterior cerebral arteries are patent. Left posterior communicating artery is patent. Moderate stenosis in the left P2 segment. Right PCA widely patent with mild disease distally.  Internal carotid artery patent bilaterally. Anterior and middle cerebral arteries widely patent.  Negative for cerebral aneurysm.  IMPRESSION: Meningioma left planum sphenoidale is stable. No invasion of the cavernous sinus. Possible compression of the optic chiasm and optic tract on the right. Additional small meningioma right posterior clinoid also unchanged. Right frontal plaque like meningioma stable.  No acute infarct.  Moderate stenosis left PCA.  Mild disease distal right PCA.   Electronically Signed   By: Franchot Gallo M.D.   On: 10/25/2013 10:49    CBC  Recent Labs Lab 10/24/13 1324 10/24/13 2108  WBC 6.3 6.1  HGB 13.1 11.9*  HCT 39.4 35.6*  PLT 182 156  MCV 85.8 86.0  MCH 28.5 28.7  MCHC 33.2 33.4  RDW 13.5 13.8    Chemistries   Recent Labs Lab 10/24/13 1324 10/24/13 2108  NA 140  --   K 4.3  --   CL 101  --   CO2 27  --   GLUCOSE 90  --   BUN 16  --   CREATININE 0.66 0.84  CALCIUM 9.5  --     ------------------------------------------------------------------------------------------------------------------ estimated creatinine clearance is 51.7 ml/min (by C-G formula based on Cr of 0.84). ------------------------------------------------------------------------------------------------------------------ No results found for this basename: HGBA1C,  in the last 72 hours ------------------------------------------------------------------------------------------------------------------  Recent Labs  10/25/13 0650  CHOL 226*  HDL 37*  LDLCALC 147*  TRIG 208*  CHOLHDL 6.1   No results  found for this basename: HGBA1C    ------------------------------------------------------------------------------------------------------------------  Recent Labs  10/24/13 2108  TSH 1.280   ------------------------------------------------------------------------------------------------------------------ No results found for this basename: VITAMINB12, FOLATE, FERRITIN, TIBC, IRON, RETICCTPCT,  in the last 72 hours  Coagulation profile  Recent Labs Lab 10/24/13 1324  INR 1.01    No results found for this basename: DDIMER,  in the last 72 hours  Cardiac Enzymes No results found for this basename: CK, CKMB, TROPONINI, MYOGLOBIN,  in the last 168 hours ------------------------------------------------------------------------------------------------------------------ No components found with this basename: POCBNP,      Time Spent in minutes   35   Thurnell Lose M.D on 10/25/2013 at 10:52 AM  Between 7am to 7pm - Pager - [phone]  After 7pm go to [email or web address] - password [email or web address]  And look for the night coverage person covering for me after hours  [facility] Office  [phone]   **Disclaimer: This note may have been dictated with voice recognition software. Similar sounding words can inadvertently be transcribed and this note may contain transcription errors  which may not have been corrected upon publication of note.**

## 2013-10-25 NOTE — Evaluation (Signed)
Physical Therapy Evaluation Patient Details Name: Melissa Snyder MRN: [id number] DOB: 12-Aug-1933 Today's Date: 10/25/2013   History of Present Illness    80 year old woman with past medical history significant for hypertension, hypothyroidism, GERD. Adm due to decreased vision in Lt eye. MRI revealed meningioma. Opthalmology consulted and diagnosed with central retinal artery occlusion.   Clinical Impression  Pt adm due to the above. Pt with no mobility/balance deficits at this time. Ambulating at baseline and mod I. Pt with visual field cuts in lt eye. Encouraged pt to ambulate as tolerated while in hospital stay.     Follow Up Recommendations No PT follow up;Supervision - Intermittent    Equipment Recommendations  None recommended by PT    Recommendations for Other Services       Precautions / Restrictions Precautions Precautions: None Precaution Comments: decreased vision in Lt Eye Restrictions Weight Bearing Restrictions: No      Mobility  Bed Mobility Overal bed mobility: Independent                Transfers Overall transfer level: Modified independent Equipment used: None             General transfer comment: good technique; slightly decreased speed due to decr vision in LT EYE  Ambulation/Gait Ambulation/Gait assistance: Modified independent (Device/Increase time) Ambulation Distance (Feet): 500 Feet Assistive device: None Gait Pattern/deviations: WFL(Within Functional Limits)   Gait velocity interpretation: at or above normal speed for age/gender General Gait Details: pt challenged with high level balance activities; no LOB noted; pt with decreased peripheral vision in lt Eye   Stairs Stairs: Yes Stairs assistance: Modified independent (Device/Increase time) Stair Management: One rail Right;Forwards;Alternating pattern Number of Stairs: 5 General stair comments: pt demo good technique with stair mobility; no LOB noted   Wheelchair Mobility     Modified Rankin (Stroke Patients Only)       Balance Overall balance assessment: Independent                               Standardized Balance Assessment Standardized Balance Assessment : Dynamic Gait Index   Dynamic Gait Index Level Surface: Normal Change in Gait Speed: Mild Impairment Gait with Horizontal Head Turns: Normal Gait with Vertical Head Turns: Normal Gait and Pivot Turn: Normal Step Over Obstacle: Normal Step Around Obstacles: Normal Steps: Mild Impairment Total Score: 22       Pertinent Vitals/Pain No c/o pain     Home Living Family/patient expects to be discharged to:: Private residence Living Arrangements: Spouse/significant other Available Help at Discharge: Family;Available 24 hours/day (if needed) Type of Home: House Home Access: Stairs to enter   [employer] of Steps: 1 Home Layout: One level Home Equipment: None      Prior Function Level of Independence: Independent               Hand Dominance   Dominant Hand: Left    Extremity/Trunk Assessment   Upper Extremity Assessment: Defer to OT evaluation           Lower Extremity Assessment: Overall WFL for tasks assessed      Cervical / Trunk Assessment: Normal  Communication   Communication: No difficulties  Cognition Arousal/Alertness: Awake/alert Behavior During Therapy: WFL for tasks assessed/performed Overall Cognitive Status: Within Functional Limits for tasks assessed                      General Comments General  comments (skin integrity, edema, etc.): pt reports inconsitancies with Lt visual field cut; reports she has times when she can see peripherally     Exercises        Assessment/Plan    PT Assessment Patent does not need any further PT services  PT Diagnosis     PT Problem List    PT Treatment Interventions     PT Goals (Current goals can be found in the Care Plan section) Acute Rehab PT Goals Patient Stated Goal:  none stated PT Goal Formulation: No goals set, d/c therapy    Frequency     Barriers to discharge        Co-evaluation               End of Session Equipment Utilized During Treatment: Gait belt Activity Tolerance: Patient tolerated treatment well Patient left: in chair;with call bell/phone within reach Nurse Communication: Mobility status         Time: 2671-2458 PT Time Calculation (min): 15 min   Charges:   PT Evaluation $Initial PT Evaluation Tier I: 1 Procedure PT Treatments $Gait Training: 8-22 mins   PT G CodesKennis Carina [place], [place] 10/25/2013, 2:04 PM

## 2013-10-25 NOTE — Progress Notes (Signed)
Stroke Team Progress Note  HISTORY Melissa Snyder is a 80 y.o. female who woke up with "scrambled vision" with her left eye upon waking (around 8 am) on 10/24/2013. No Headache, nausea or other associated symptoms. She called her optometrist who agreed to see her at noon. On the way to [facility], she noted that the vision went out completely OS. No pain. No weakness, numbness, dizziness or gait instability noted. After being seen there the patient was sent to the ED where she was evaluated by Ophthalmology and diagnosed with a central retinal artery occlusion.   Date last known well: Date: 10/23/2013  Time last known well: Time: 22:30  tPA Given: No: Outside time window    SUBJECTIVE Patient currently off the floor for MRI.   OBJECTIVE Most recent Vital Signs: Filed Vitals:   10/25/13 0100 10/25/13 0300 10/25/13 0500 10/25/13 0804  BP: 145/64 144/65 162/77   Pulse: 57 60 58   Temp: 98.4 F (36.9 C) 98.4 F (36.9 C) 98.2 F (36.8 C)   TempSrc: Oral Oral Oral   Resp: 16 16 17    Height:    5\' 6"  (1.676 m)  Weight:    150 lb (68.04 kg)  SpO2: 96% 96% 95%    CBG (last 3)  No results found for this basename: GLUCAP,  in the last 72 hours  IV Fluid Intake:     MEDICATIONS  . aspirin  81 mg Oral Daily  . atenolol  50 mg Oral QHS  . beta carotene w/minerals  1 tablet Oral Daily  . calcium-vitamin D  1 tablet Oral Q breakfast  . enoxaparin (LOVENOX) injection  40 mg Subcutaneous Q24H  . levothyroxine  112 mcg Oral QAC breakfast  . multivitamin with minerals  1 tablet Oral Daily  . omega-3 acid ethyl esters  1 g Oral BID  . pantoprazole  40 mg Oral Daily  . sodium chloride  3 mL Intravenous Q12H   PRN:  sodium chloride, acetaminophen, acetaminophen, senna-docusate, sodium chloride  Diet:  Cardiac thin liquids Activity:  Up with assistance DVT Prophylaxis:  Lovenox  CLINICALLY SIGNIFICANT STUDIES Basic Metabolic Panel:  Recent Labs Lab 10/24/13 1324 10/24/13 2108  NA  140  --   K 4.3  --   CL 101  --   CO2 27  --   GLUCOSE 90  --   BUN 16  --   CREATININE 0.66 0.84  CALCIUM 9.5  --    Liver Function Tests: No results found for this basename: AST, ALT, ALKPHOS, BILITOT, PROT, ALBUMIN,  in the last 168 hours CBC:  Recent Labs Lab 10/24/13 1324 10/24/13 2108  WBC 6.3 6.1  HGB 13.1 11.9*  HCT 39.4 35.6*  MCV 85.8 86.0  PLT 182 156   Coagulation:  Recent Labs Lab 10/24/13 1324  LABPROT 13.1  INR 1.01   Cardiac Enzymes: No results found for this basename: CKTOTAL, CKMB, CKMBINDEX, TROPONINI,  in the last 168 hours Urinalysis: No results found for this basename: COLORURINE, APPERANCEUR, LABSPEC, PHURINE, GLUCOSEU, HGBUR, BILIRUBINUR, KETONESUR, PROTEINUR, UROBILINOGEN, NITRITE, LEUKOCYTESUR,  in the last 168 hours Lipid Panel    Component Value Date/Time   CHOL 226* 10/25/2013 0650   TRIG 208* 10/25/2013 0650   HDL 37* 10/25/2013 0650   CHOLHDL 6.1 10/25/2013 0650   VLDL 42* 10/25/2013 0650   LDLCALC 147* 10/25/2013 0650   HgbA1C  No results found for this basename: HGBA1C    Urine Drug Screen:   No results  found for this basename: labopia, cocainscrnur, labbenz, amphetmu, thcu, labbarb    Alcohol Level: No results found for this basename: ETH,  in the last 168 hours  Ct Head Wo Contrast 10/24/2013    Left suprasellar mass is again noted and consistent with meningioma seen on prior MRI exam. Minimal chronic ischemic white matter disease is noted. No other significant intracranial abnormality seen.       MRI of the brain    Meningioma left planum sphenoidale is stable. No invasion of the  cavernous sinus. Possible compression of the optic chiasm and optic  tract on the right. Additional small meningioma right posterior  clinoid also unchanged. Right frontal plaque like meningioma stable.    2D Echocardiogram  pending  Carotid Doppler  pending  CXR  pending  EKG - Sinus rhythm rate 62 beats per minute with left bundle branch  block.  For complete results please see formal report.   Therapy Recommendations pending  Physical Exam   Mental Status:  Alert, oriented, thought content appropriate. Speech fluent without evidence of aphasia. Able to follow 3 step commands without difficulty.  Cranial Nerves:  II: Discs flat bilaterally; Visual fields grossly normal OD with no vision reported OS, pupils dilated. Minimally reactive on the right and unreactive on the left  III,IV, VI: ptosis not present, extra-ocular motions intact bilaterally  V,VII: smile symmetric, facial light touch sensation normal bilaterally  VIII: hearing normal bilaterally  IX,X: gag reflex present  XI: bilateral shoulder shrug  XII: midline tongue extension  Motor:  Right : Upper extremity 5/5 Left: Upper extremity 5/5  Lower extremity 5/5 Lower extremity 5/5  Tone and bulk:normal tone throughout; no atrophy noted  Sensory: Pinprick and light touch intact throughout, bilaterally  Deep Tendon Reflexes: 2+ in the upper extremities, 1+ at the knees and absent at the ankles.  Plantars:  Right: downgoing Left: downgoing  Cerebellar:  normal finger-to-nose and normal heel-to-shin test  Gait: Deferred     ASSESSMENT Ms. Melissa Snyder is a 80 y.o. female presenting with loss of vision in the left eye. TPA was not initiated secondary to late presentation. A CT scan of the head showed no acute finding. On no antithrombotics prior to admission. Now on aspirin 81 mg orally every day for secondary stroke prevention. Patient with resultant loss of vision in the left eye. Stroke work up underway. Differential is likely central retinal artery occlusion vs optic nerve/chiasm related to meningioma, though suspect this is less likely.    Hyperlipidemia - cholesterol 226 LDL 147 - no statin prior to admission ( niacin allergy )  Hypertension  History of migraine headaches  Left suprasellar mass consistent with meningioma   Hospital day #  1  TREATMENT/PLAN  Continue aspirin 81 mg orally every day for secondary stroke prevention.  Carotid dopplers show bilateral 1-39% ICA stenosis  Await therapy evaluations  Add statin for hyperlipidemia  Await  2-D echo, hemoglobin A1c   Lowry Ram [facility] Pager [phone] 10/25/2013, 8:21 AM  **Disclaimer: This note was dictated with voice recognition software. Similar sounding words can inadvertently be transcribed and this note may contain transcription errors which may not have been corrected upon publication of note.**   I have personally obtained a history, examined the patient, evaluated imaging results, and formulated the assessment and plan of care. I agree with the above.    To contact Stroke Continuity provider, please refer to [email or web address] After hours, contact General Neurology

## 2013-10-25 NOTE — Progress Notes (Signed)
PT Cancellation Note  Patient Details Name: Melissa Snyder MRN: [id number] DOB: 20-Jan-1934   Cancelled Treatment:    Reason Eval/Treat Not Completed: Patient at procedure or test/unavailable. Pt off floor at MRI. Will re-attempt evaluation at next available time.    [place], [place] 10/25/2013, 9:26 AM

## 2013-10-25 NOTE — Progress Notes (Signed)
  Echocardiogram 2D Echocardiogram has been performed.  Melissa Snyder 10/25/2013, 11:04 AM

## 2013-10-25 NOTE — Progress Notes (Signed)
Pt. Refused lipitor.  Will continue to monitor patient.

## 2013-10-25 NOTE — Consult Note (Signed)
Reason for Consult:  Multiple intracranial meningiomas Referring Physician: Thurnell Lose, MD  Melissa Snyder is an 80 y.o. left-handed white female.  HPI: Patient admitted yesterday with diagnosis of acute loss of vision in the left eye. Patient said that she went to bed the evening before without any symptoms or difficulties, but awoke yesterday with scrambled flashing light in the left eye. By later that morning she had lost vision entirely in the left eye. She was initially evaluated by Dr. Laurence Aly (OD) at [facility], who subsequently recommended emergency room evaluation. The patient went to [facility] emergency room where she was evaluated by Dr. Luberta Mutter (ophthalmology). She felt the patient suffered a central retinal artery occlusion and recommended stroke workup and the patient was admitted to the hospitalist service, and was also seen in neurology consultation by Dr. Alexis Goodell.  Workup has included carotid Dopplers, which according to Dr. Doy Mince showed no hemodynamically significant stenosis. MRI of the brain was done and was compared to a series of 6 MRIs of the brain dating back to February of 2010 which have revealed multiple cranial meningiomas including the largest being a left planum sphenoidale meningioma which extends into the suprasellar and supraoptic cisterns, a smaller right posterior clinoid meningioma, and a en plaque right frontal meningioma. The patient's been followed by Dr. Sherley Bounds, my partner at the [facility], since February of 2010, most recently evaluating the patient in October 2014, after she underwent her most recent previous MRI in September of 2014. The tumor has shown very minimal interval growth over the past 5+ years.  Neurosurgical consultation was requested regarding these previously documented meningiomas.  Symptomatically the patient denies complaints other than for the loss of vision in the left eye.  She denies headaches, seizures, weakness, loss of sensation, nausea, vomiting, or other difficulties or complaints.  Past Medical History:  Past Medical History  Diagnosis Date  . Chest pain   . GERD (gastroesophageal reflux disease)   . Bundle branch block left   . Hyperlipidemia   . Hypertension   . Hypothyroid   . Migraine     Past Surgical History:  Past Surgical History  Procedure Laterality Date  . Cardiac catheterization      Family History: No family history on file.  Social History:  reports that she has never smoked. She has never used smokeless tobacco. She reports that she does not drink alcohol or use illicit drugs.  Allergies:  Allergies  Allergen Reactions  . Ibuprofen Other (See Comments)    Causes mouth sores  . Tramadol Itching  . Niacin And Related Other (See Comments)    Flushing, burning, redness    Medications: I have reviewed the patient's current medications.  ROS:  Notable for those difficulties described in her history of present was then past medical history, but is otherwise unremarkable.  Physical Examination: Patient is a well-developed well-nourished white female in no acute distress. Blood pressure 144/69, pulse 67, temperature 98.2 F (36.8 C), temperature source Oral, resp. rate 18, height 5' 6" (1.676 m), weight 68.04 kg (150 lb), SpO2 97.00%. Lungs:  Clear to auscultation, she has symmetrical respiratory excursion. Heart:  Regular rate and rhythm, normal S1-S2, no murmur. Abdomen:  Soft, nondistended, bowel sounds present. Extremity:  No clubbing, cyanosis, or edema.  Neurological Examination: Mental Status Examination:  Awake and alert, oriented x3. Following commands. Speech fluent. Cranial Nerve Examination:  Right pupil 2.5 mm, round, reactive to light. Left pupil  3 mm, round, not reactive to light. Positive Marcus Gunn pupil on the left. EOMI. Facial sensation intact. Facial movements symmetrical. Hearing present. Palatal  movement symmetrical. Shoulder shrug is symmetrical. Tongue midline. Motor Examination:  5/5 strength in the upper and lower extremities. No drift of the upper extremities. Sensory Examination:  Intact to pinprick throughout. Reflex Examination:   Minimal reflexes in the upper and lower extremities. Toes are downgoing bilaterally. Gait and Stance Examination:  Normal gait and stance.   Results for orders placed during the hospital encounter of 10/24/13 (from the past 48 hour(s))  CBC     Status: None   Collection Time    10/24/13  1:24 PM      Result Value Ref Range   WBC 6.3  4.0 - 10.5 K/uL   RBC 4.59  3.87 - 5.11 MIL/uL   Hemoglobin 13.1  12.0 - 15.0 g/dL   HCT 39.4  36.0 - 46.0 %   MCV 85.8  78.0 - 100.0 fL   MCH 28.5  26.0 - 34.0 pg   MCHC 33.2  30.0 - 36.0 g/dL   RDW 13.5  11.5 - 15.5 %   Platelets 182  150 - 400 K/uL  BASIC METABOLIC PANEL     Status: Abnormal   Collection Time    10/24/13  1:24 PM      Result Value Ref Range   Sodium 140  137 - 147 mEq/L   Potassium 4.3  3.7 - 5.3 mEq/L   Chloride 101  96 - 112 mEq/L   CO2 27  19 - 32 mEq/L   Glucose, Bld 90  70 - 99 mg/dL   BUN 16  6 - 23 mg/dL   Creatinine, Ser 0.66  0.50 - 1.10 mg/dL   Calcium 9.5  8.4 - 10.5 mg/dL   GFR calc non Af Amer 82 (*) >90 mL/min   GFR calc Af Amer >90  >90 mL/min   Comment: (NOTE)     The eGFR has been calculated using the CKD EPI equation.     This calculation has not been validated in all clinical situations.     eGFR's persistently <90 mL/min signify possible Chronic Kidney     Disease.  PROTIME-INR     Status: None   Collection Time    10/24/13  1:24 PM      Result Value Ref Range   Prothrombin Time 13.1  11.6 - 15.2 seconds   INR 1.01  0.00 - 1.49  SEDIMENTATION RATE     Status: None   Collection Time    10/24/13  1:24 PM      Result Value Ref Range   Sed Rate 15  0 - 22 mm/hr  C-REACTIVE PROTEIN     Status: Abnormal   Collection Time    10/24/13  1:24 PM      Result  Value Ref Range   CRP <0.5 (*) <0.60 mg/dL   Comment: Result repeated and verified.     Performed at [facility]  CBC     Status: Abnormal   Collection Time    10/24/13  9:08 PM      Result Value Ref Range   WBC 6.1  4.0 - 10.5 K/uL   RBC 4.14  3.87 - 5.11 MIL/uL   Hemoglobin 11.9 (*) 12.0 - 15.0 g/dL   HCT 35.6 (*) 36.0 - 46.0 %   MCV 86.0  78.0 - 100.0 fL   MCH 28.7  26.0 -  34.0 pg   MCHC 33.4  30.0 - 36.0 g/dL   RDW 13.8  11.5 - 15.5 %   Platelets 156  150 - 400 K/uL  CREATININE, SERUM     Status: Abnormal   Collection Time    10/24/13  9:08 PM      Result Value Ref Range   Creatinine, Ser 0.84  0.50 - 1.10 mg/dL   GFR calc non Af Amer 65 (*) >90 mL/min   GFR calc Af Amer 75 (*) >90 mL/min   Comment: (NOTE)     The eGFR has been calculated using the CKD EPI equation.     This calculation has not been validated in all clinical situations.     eGFR's persistently <90 mL/min signify possible Chronic Kidney     Disease.  TSH     Status: None   Collection Time    10/24/13  9:08 PM      Result Value Ref Range   TSH 1.280  0.350 - 4.500 uIU/mL   Comment: Please note change in reference range.  HEMOGLOBIN A1C     Status: Abnormal   Collection Time    10/25/13  6:50 AM      Result Value Ref Range   Hemoglobin A1C 5.9 (*) <5.7 %   Comment: (NOTE)                                                                               According to the ADA Clinical Practice Recommendations for 2011, when     HbA1c is used as a screening test:      >=6.5%   Diagnostic of Diabetes Mellitus               (if abnormal result is confirmed)     5.7-6.4%   Increased risk of developing Diabetes Mellitus     References:Diagnosis and Classification of Diabetes Mellitus,Diabetes     KTGY,5638,93(TDSKA 1):S62-S69 and Standards of Medical Care in             Diabetes - 2011,Diabetes JGOT,1572,62 (Suppl 1):S11-S61.   Mean Plasma Glucose 123 (*) <117 mg/dL   Comment: Performed at [place]     Status: Abnormal   Collection Time    10/25/13  6:50 AM      Result Value Ref Range   Cholesterol 226 (*) 0 - 200 mg/dL   Triglycerides 208 (*) <150 mg/dL   HDL 37 (*) >39 mg/dL   Total CHOL/HDL Ratio 6.1     VLDL 42 (*) 0 - 40 mg/dL   LDL Cholesterol 147 (*) 0 - 99 mg/dL   Comment:            Total Cholesterol/HDL:CHD Risk     Coronary Heart Disease Risk Table                         Men   Women      1/2 Average Risk   3.4   3.3      Average Risk       5.0   4.4      2 X Average Risk  9.6   7.1      3 X Average Risk  23.4   11.0                Use the calculated Patient Ratio     above and the CHD Risk Table     to determine the patient's CHD Risk.                ATP III CLASSIFICATION (LDL):      <100     mg/dL   Optimal      100-129  mg/dL   Near or Above                        Optimal      130-159  mg/dL   Borderline      160-189  mg/dL   High      >190     mg/dL   Very High    Dg Chest 2 View  10/25/2013   CLINICAL DATA:  Stroke protocol. History of chest pain and hypertension.  EXAM: CHEST  2 VIEW  COMPARISON:  12/20/2009.  FINDINGS: The heart size and mediastinal contours are stable. There is chronic lung disease with mild hyperinflation and biapical scarring. Asymmetric density overlapping the medial border of the left scapula on the frontal examination is probably osseous in nature. It is difficult to exclude a pulmonary nodule in this area, however. There is no airspace disease or pleural effusion.  IMPRESSION: 1. No acute chest findings demonstrated. 2. Chronic lung disease with biapical scarring. A developing nodule laterally at the left apex cannot be excluded based on this examination, although this may be due to an overlap of osseous structures. A repeat PA examination with the arms elevated over the head is recommended with attention to this area.   Electronically Signed   By: Camie Patience M.D.   On: 10/25/2013 13:47   Ct Head Wo  Contrast  10/24/2013   CLINICAL DATA:  Vision loss.  EXAM: CT HEAD WITHOUT CONTRAST  TECHNIQUE: Contiguous axial images were obtained from the base of the skull through the vertex without intravenous contrast.  COMPARISON:  MRI scan of March 02, 2013; CT scan of February 08, 2010.  FINDINGS: Bony calvarium appears intact. No midline shift is noted. Minimal chronic ischemic white matter disease is noted. 20 x 13 mm density is seen in left suprasellar region consistent with meningioma seen on prior exam. No midline shift is noted. Ventricular size is within normal limits. No evidence of acute infarction or hemorrhage is noted.  IMPRESSION: Left suprasellar mass is again noted and consistent with meningioma seen on prior MRI exam. Minimal chronic ischemic white matter disease is noted. No other significant intracranial abnormality seen.   Electronically Signed   By: Sabino Dick M.D.   On: 10/24/2013 13:46   Mr Brain Wo Contrast  10/25/2013   CLINICAL DATA:  Loss of vision left eye.  Known meningioma.  EXAM: MRI HEAD WITHOUT AND WITH CONTRAST  MRA HEAD WITHOUT CONTRAST  TECHNIQUE: Multiplanar, multiecho pulse sequences of the brain and surrounding structures were obtained without and with intravenous contrast. Angiographic images of the head were obtained using MRA technique without contrast.  CONTRAST:  81m MULTIHANCE GADOBENATE DIMEGLUMINE 529 MG/ML IV SOLN  COMPARISON:  MRI head 03/02/2013  FINDINGS: MRI HEAD FINDINGS  Negative for acute infarct. Mild chronic microvascular ischemic change in the white matter bilaterally. Negative for intracranial hemorrhage.  Meningioma  of the left planum sphenoidale extends back to the sella. This shows homogeneous intense enhancement. On axial images the meningioma measures 18 x 21 mm and is similar to the prior study. No invasion of the cavernous sinus. The meningioma is touching the optic chiasm and optic tract on the left, similar to the prior study.  6 mm meningioma  on the right posterior clinoid. This is unchanged from the prior study.  Right frontal plaque like meningioma measures approximately 4 mm in thickness and is similar to the prior study. No edema in the adjacent brain.  Ventricle size is normal.  No shift of the midline structures.  MRA HEAD FINDINGS  Left vertebral artery dominant and widely patent. Left PICA patent. Nondominant right vertebral artery ends in PICA. The basilar is widely patent. Superior cerebellar and posterior cerebral arteries are patent. Left posterior communicating artery is patent. Moderate stenosis in the left P2 segment. Right PCA widely patent with mild disease distally.  Internal carotid artery patent bilaterally. Anterior and middle cerebral arteries widely patent.  Negative for cerebral aneurysm.  IMPRESSION: Meningioma left planum sphenoidale is stable. No invasion of the cavernous sinus. Possible compression of the optic chiasm and optic tract on the right. Additional small meningioma right posterior clinoid also unchanged. Right frontal plaque like meningioma stable.  No acute infarct.  Moderate stenosis left PCA.  Mild disease distal right PCA.   Electronically Signed   By: Franchot Gallo M.D.   On: 10/25/2013 10:49   Mr Jodene Nam Head/brain Wo Cm  10/25/2013   CLINICAL DATA:  Loss of vision left eye.  Known meningioma.  EXAM: MRI HEAD WITHOUT AND WITH CONTRAST  MRA HEAD WITHOUT CONTRAST  TECHNIQUE: Multiplanar, multiecho pulse sequences of the brain and surrounding structures were obtained without and with intravenous contrast. Angiographic images of the head were obtained using MRA technique without contrast.  CONTRAST:  68m MULTIHANCE GADOBENATE DIMEGLUMINE 529 MG/ML IV SOLN  COMPARISON:  MRI head 03/02/2013  FINDINGS: MRI HEAD FINDINGS  Negative for acute infarct. Mild chronic microvascular ischemic change in the white matter bilaterally. Negative for intracranial hemorrhage.  Meningioma of the left planum sphenoidale extends back  to the sella. This shows homogeneous intense enhancement. On axial images the meningioma measures 18 x 21 mm and is similar to the prior study. No invasion of the cavernous sinus. The meningioma is touching the optic chiasm and optic tract on the left, similar to the prior study.  6 mm meningioma on the right posterior clinoid. This is unchanged from the prior study.  Right frontal plaque like meningioma measures approximately 4 mm in thickness and is similar to the prior study. No edema in the adjacent brain.  Ventricle size is normal.  No shift of the midline structures.  MRA HEAD FINDINGS  Left vertebral artery dominant and widely patent. Left PICA patent. Nondominant right vertebral artery ends in PICA. The basilar is widely patent. Superior cerebellar and posterior cerebral arteries are patent. Left posterior communicating artery is patent. Moderate stenosis in the left P2 segment. Right PCA widely patent with mild disease distally.  Internal carotid artery patent bilaterally. Anterior and middle cerebral arteries widely patent.  Negative for cerebral aneurysm.  IMPRESSION: Meningioma left planum sphenoidale is stable. No invasion of the cavernous sinus. Possible compression of the optic chiasm and optic tract on the right. Additional small meningioma right posterior clinoid also unchanged. Right frontal plaque like meningioma stable.  No acute infarct.  Moderate stenosis left PCA.  Mild disease  distal right PCA.   Electronically Signed   By: Franchot Gallo M.D.   On: 10/25/2013 10:49     Assessment/Plan: Patient with acute loss of vision in the left eye yesterday morning, felt by ophthalmology and neurology to be consistent with a central retinal artery occlusion. Patient also has multiple intracranial meningiomas, which have been followed for over 5 years Dr. Sherley Bounds. MRI scan was repeated over the past 5+ years show minimal interval growth, with no acute change seen on the MRI done yesterday. I  discussed the case with the patient, as well as with Dr. Candiss Norse. I do not feel there is any role for acute intervention regarding this patient's meningiomas. In the end it is difficult to know whether in some way the patient's left planum sphenoidale meningioma might have contributed to the loss of vision, but certainly acute intervention for the meningioma would not improve the patient's likelihood of recovering vision in the left eye.  I've recommended that the patient followup with Dr. Ronnald Ramp in the office in the next couple of weeks. Dr. Ellie Lunch has asked that the patient return for ophthalmologic followup with in one month. Certainly the patient will need stroke neurology followup as an outpatient as well, and certainly she will need to followup with her primary physician Dr. [name]. Dr. Candiss Norse has indicated and he'll make the arrangements for these followups.   Hosie Spangle, MD 10/25/2013, 2:47 PM

## 2013-10-26 MED ORDER — ASPIRIN 81 MG PO CHEW: 81.0000 mg | CHEWABLE_TABLET | Freq: Every day | ORAL | Status: DC

## 2013-10-26 MED ORDER — ATORVASTATIN CALCIUM 20 MG PO TABS: 20.0000 mg | ORAL_TABLET | Freq: Every day | ORAL | Status: DC

## 2013-10-26 NOTE — Evaluation (Signed)
Occupational Therapy Evaluation Patient Details Name: Melissa Snyder MRN: [id number] DOB: Apr 05, 1934 Today's Date: 10/26/2013    History of Present Illness Patient admitted 10/24/13 with diagnosis of acute loss of vision in the left eye.  She was diagnosed with a central retinal artery occlusion.   Clinical Impression   Patient evaluated by Occupational Therapy with no further acute OT needs identified. All education has been completed and the patient has no further questions. Pt with deficits with depth perception.   All education completed, and pt provided with activity and safety info.  Recommend no driving initially until she adjusts to vision changes.  See below for any follow-up Occupational Therapy or equipment needs. OT is signing off. Thank you for this referral.     Follow Up Recommendations  No OT follow up    Equipment Recommendations  None recommended by OT    Recommendations for Other Services       Precautions / Restrictions Precautions Precautions: None Precaution Comments: decreased vision in Lt Eye      Mobility Bed Mobility Overal bed mobility: Independent                Transfers Overall transfer level: Modified independent                    Balance Overall balance assessment: Independent                                          ADL Overall ADL's : Modified independent                                             Vision                 Additional Comments: Pt with absent vision OS.  OD at baseline per pt report.  Pt is able to read without difficulty.  Discussed loss of vision with pt and safety implications associated.  Discussed using shopping cart initially in grocery store, or holding onto spouse when in busy environments until she has "learned" her depth.  Discussed fully paying attention to steps, curbs, changes in terrain to prevent falls.   Worked with pt tossing ball in Rt. hand, then  Lt hand then Rt <> Lt.  Pt initially with several drops, but improved with practice and able to progress with tossing, ambulating, then counting to 100 by 2's.   Instructed pt to continue at home and to toss balls with spouse being cautious not to fall.  Also discussed recommendation for no driving initially until cleared by MD and until she has had time to adjust to changes    Perception Perception Spatial deficits: impaired depth perception    Praxis      Pertinent Vitals/Pain Pt denies pain      Hand Dominance Left   Extremity/Trunk Assessment Upper Extremity Assessment Upper Extremity Assessment: Overall WFL for tasks assessed   Lower Extremity Assessment Lower Extremity Assessment: Defer to PT evaluation   Cervical / Trunk Assessment Cervical / Trunk Assessment: Normal   Communication Communication Communication: No difficulties   Cognition Arousal/Alertness: Awake/alert Behavior During Therapy: WFL for tasks assessed/performed Overall Cognitive Status: Within Functional Limits for tasks assessed  General Comments       Exercises       Shoulder Instructions      Home Living Family/patient expects to be discharged to:: Private residence Living Arrangements: Spouse/significant other Available Help at Discharge: Family;Available 24 hours/day (if needed) Type of Home: House Home Access: Stairs to enter [employer] of Steps: 1   Home Layout: One level     Bathroom Shower/Tub: Tub/shower unit Shower/tub characteristics: Architectural technologist: Standard     Home Equipment: None          Prior Functioning/Environment Level of Independence: Independent        Comments: Pt drives and is independent in the community.  Works out 3-4x/wk    OT Diagnosis:     OT Problem List:     OT Treatment/Interventions:      OT Goals(Current goals can be found in the care plan section)    OT Frequency:     Barriers to D/C:             Co-evaluation              End of Session    Activity Tolerance: Patient tolerated treatment well Patient left: in chair;with call bell/phone within reach   Time: 1010-1028 OT Time Calculation (min): 18 min Charges:  OT General Charges $OT Visit: 1 Procedure OT Evaluation $Initial OT Evaluation Tier I: 1 Procedure OT Treatments $Self Care/Home Management : 8-22 mins G-Codes:    Alezander Dimaano M Trayveon Beckford November 02, 2013, 10:44 AM

## 2013-10-26 NOTE — Discharge Summary (Signed)
Melissa Snyder, is a 80 y.o. female  DOB 03/04/1934  MRN [id number].  Admission date:  10/24/2013  Admitting Physician  Erline Hau, MD  Discharge Date:  10/26/2013   Primary MD  Jerlyn Ly, MD  Recommendations for primary care physician for things to follow:   Need two-view chest x-ray within a week to evaluate lung nodule, please look at the follow up section below and please make sure patient follows with the recommended physicians.   Admission Diagnosis  GERD (gastroesophageal reflux disease) [530.81] HTN (hypertension) [401.9] Hypothyroidism [244.9] Central retinal artery occlusion of left eye [362.31]   Discharge Diagnosis  GERD (gastroesophageal reflux disease) [530.81] HTN (hypertension) [401.9] Hypothyroidism [244.9] Central retinal artery occlusion of left eye [362.31]     Principal Problem:   Central retinal artery occlusion of left eye Active Problems:   HTN (hypertension)   Hypothyroidism   GERD (gastroesophageal reflux disease)      Past Medical History  Diagnosis Date  . Chest pain   . GERD (gastroesophageal reflux disease)   . Bundle branch block left   . Hyperlipidemia   . Hypertension   . Hypothyroid   . Migraine     Past Surgical History  Procedure Laterality Date  . Cardiac catheterization       Discharge Condition: Stable   Follow UP  Follow-up Information   Follow up with PERINI,MARK A, MD. Schedule an appointment as soon as possible for a visit in 1 week.   Specialty:  Internal Medicine   Contact information:   [place] [address] [phone]       Follow up with Eustace Moore, MD. Schedule an appointment as soon as possible for a visit in 1 week.   Specialty:  Neurosurgery   Contact information:   [address] [place]  [address] [phone]       Follow up with Laurence Aly, OD. Schedule an appointment as soon as possible for a visit in 1 week.   Specialty:  Glass blower/designer information:   [place] [address] [phone]       Follow up with MCCUEN,CHRISTINE L, MD. Schedule an appointment as soon as possible for a visit in 1 week.   Specialty:  Ophthalmology   Contact information:   [place]  [address] [phone]       Follow up with Dola Argyle, MD. Schedule an appointment as soon as possible for a visit in 1 week.   Specialty:  Cardiology   Contact information:   [address] [place] [address] [phone]         Discharge Instructions  and  Discharge Medications      Discharge Instructions   Discharge instructions    Complete by:  As directed   Follow with Primary MD  in 7 days   Get CBC, CMP, checked 7 days by Primary MD and again as instructed by your Primary MD.  Get a 2 view Chest X ray done next visit.   Activity: As tolerated with Full fall precautions use walker/cane & assistance as needed   Disposition Home **   Diet: Heart Healthy ** , feeding assistance and aspiration precautions if needed.  For Heart failure patients - Check your Weight same time everyday, if you gain over 2 pounds, or you develop in leg swelling, experience more shortness of breath or chest pain, call your Primary MD immediately. Follow Cardiac Low Salt Diet and 1.8 lit/day fluid restriction.   On your next visit with her primary care physician please Get Medicines reviewed and adjusted.  Please request your Prim.MD to go over all Hospital Tests and Procedure/Radiological results at the follow up, please get all Hospital records sent to your Prim MD by signing hospital release before you go home.   If you experience worsening of your admission symptoms, develop shortness of breath, life threatening emergency, suicidal or  homicidal thoughts you must seek medical attention immediately by calling 911 or calling your MD immediately  if symptoms less severe.  You Must read complete instructions/literature along with all the possible adverse reactions/side effects for all the Medicines you take and that have been prescribed to you. Take any new Medicines after you have completely understood and accpet all the possible adverse reactions/side effects.   Do not drive and provide baby sitting services if your were admitted for syncope or siezures until you have seen by Primary MD or a Neurologist and advised to do so again.  Do not drive when taking Pain medications.    Do not take more than prescribed Pain, Sleep and Anxiety Medications  Special Instructions: If you have smoked or chewed Tobacco  in the last 2 yrs please stop smoking, stop any regular Alcohol  and or any Recreational drug use.  Wear Seat belts while driving.   Please note  You were cared for by a hospitalist during your hospital stay. If you have any questions about your discharge medications or the care you received while you were in the hospital after you are discharged, you can call the unit and asked to speak with the hospitalist on call if the hospitalist that took care of you is not available. Once you are discharged, your primary care physician will handle any further medical issues. Please note that NO REFILLS for any discharge medications will be authorized once you are discharged, as it is imperative that you return to your primary care physician (or establish a relationship with a primary care physician if you do not have one) for your aftercare needs so that they can reassess your need for medications and monitor your lab values.  Follow with Primary MD  in 7 days   Get CBC, CMP, checked 7 days by Primary MD and again as instructed by your Primary MD. Get a 2 view Chest X ray done next visit.   Activity: As tolerated with Full fall  precautions use walker/cane & assistance as needed   Disposition Home **   Diet: Heart Healthy ** , feeding assistance and aspiration precautions if needed.  For Heart failure patients - Check your Weight same time everyday, if you gain over 2 pounds, or you develop in leg swelling, experience more shortness of breath or chest pain, call your Primary MD immediately. Follow Cardiac Low Salt Diet and 1.8 lit/day fluid restriction.   On your next visit with her primary care physician please Get Medicines reviewed and adjusted.  Please  request your Prim.MD to go over all Hospital Tests and Procedure/Radiological results at the follow up, please get all Hospital records sent to your Prim MD by signing hospital release before you go home.   If you experience worsening of your admission symptoms, develop shortness of breath, life threatening emergency, suicidal or homicidal thoughts you must seek medical attention immediately by calling 911 or calling your MD immediately  if symptoms less severe.  You Must read complete instructions/literature along with all the possible adverse reactions/side effects for all the Medicines you take and that have been prescribed to you. Take any new Medicines after you have completely understood and accpet all the possible adverse reactions/side effects.   Do not drive and provide baby sitting services if your were admitted for syncope or siezures until you have seen by Primary MD or a Neurologist and advised to do so again.  Do not drive when taking Pain medications.    Do not take more than prescribed Pain, Sleep and Anxiety Medications  Special Instructions: If you have smoked or chewed Tobacco  in the last 2 yrs please stop smoking, stop any regular Alcohol  and or any Recreational drug use.  Wear Seat belts while driving.   Please note  You were cared for by a hospitalist during your hospital stay. If you have any questions about your discharge  medications or the care you received while you were in the hospital after you are discharged, you can call the unit and asked to speak with the hospitalist on call if the hospitalist that took care of you is not available. Once you are discharged, your primary care physician will handle any further medical issues. Please note that NO REFILLS for any discharge medications will be authorized once you are discharged, as it is imperative that you return to your primary care physician (or establish a relationship with a primary care physician if you do not have one) for your aftercare needs so that they can reassess your need for medications and monitor your lab values.     Increase activity slowly    Complete by:  As directed             Medication List         aspirin 81 MG chewable tablet  Chew 1 tablet (81 mg total) by mouth daily.     atenolol 50 MG tablet  Commonly known as:  TENORMIN  Take 50 mg by mouth daily.     atorvastatin 20 MG tablet  Commonly known as:  LIPITOR  Take 1 tablet (20 mg total) by mouth daily at 6 PM.     beta carotene w/minerals tablet  Take 1 tablet by mouth every morning.     calcium-vitamin D 500-200 MG-UNIT per tablet  Commonly known as:  OSCAL WITH D  Take 1 tablet by mouth every morning.     fish oil-omega-3 fatty acids 1000 MG capsule  Take 1 g by mouth every morning.     levothyroxine 112 MCG tablet  Commonly known as:  SYNTHROID, LEVOTHROID  Take 112 mcg by mouth every morning.     multivitamin tablet  Take 1 tablet by mouth every morning.     omeprazole 20 MG capsule  Commonly known as:  PRILOSEC  Take 20 mg by mouth daily as needed (heartburn/GERD).          Diet and Activity recommendation: See Discharge Instructions above   Consults obtained -neurology, neurosurgery, ophthalmology   Major  procedures and Radiology Reports - PLEASE review detailed and final reports for all details, in brief -     Echogram reviewed by me  personally shows an EF of 50-55% with inferior hypokinesis. Technical issues with loading the report in Epic.   Carotids  Preliminary report: There is 1-39% ICA stenosis. Vertebral artery flow is antegrade.      Dg Chest 2 View  10/25/2013   CLINICAL DATA:  Stroke protocol. History of chest pain and hypertension.  EXAM: CHEST  2 VIEW  COMPARISON:  12/20/2009.  FINDINGS: The heart size and mediastinal contours are stable. There is chronic lung disease with mild hyperinflation and biapical scarring. Asymmetric density overlapping the medial border of the left scapula on the frontal examination is probably osseous in nature. It is difficult to exclude a pulmonary nodule in this area, however. There is no airspace disease or pleural effusion.  IMPRESSION: 1. No acute chest findings demonstrated. 2. Chronic lung disease with biapical scarring. A developing nodule laterally at the left apex cannot be excluded based on this examination, although this may be due to an overlap of osseous structures. A repeat PA examination with the arms elevated over the head is recommended with attention to this area.   Electronically Signed   By: Camie Patience M.D.   On: 10/25/2013 13:47   Ct Head Wo Contrast  10/24/2013   CLINICAL DATA:  Vision loss.  EXAM: CT HEAD WITHOUT CONTRAST  TECHNIQUE: Contiguous axial images were obtained from the base of the skull through the vertex without intravenous contrast.  COMPARISON:  MRI scan of March 02, 2013; CT scan of February 08, 2010.  FINDINGS: Bony calvarium appears intact. No midline shift is noted. Minimal chronic ischemic white matter disease is noted. 20 x 13 mm density is seen in left suprasellar region consistent with meningioma seen on prior exam. No midline shift is noted. Ventricular size is within normal limits. No evidence of acute infarction or hemorrhage is noted.  IMPRESSION: Left suprasellar mass is again noted and consistent with meningioma seen on prior MRI  exam. Minimal chronic ischemic white matter disease is noted. No other significant intracranial abnormality seen.   Electronically Signed   By: Sabino Dick M.D.   On: 10/24/2013 13:46   Mr Brain Wo Contrast  10/25/2013   CLINICAL DATA:  Loss of vision left eye.  Known meningioma.  EXAM: MRI HEAD WITHOUT AND WITH CONTRAST  MRA HEAD WITHOUT CONTRAST  TECHNIQUE: Multiplanar, multiecho pulse sequences of the brain and surrounding structures were obtained without and with intravenous contrast. Angiographic images of the head were obtained using MRA technique without contrast.  CONTRAST:  32mL MULTIHANCE GADOBENATE DIMEGLUMINE 529 MG/ML IV SOLN  COMPARISON:  MRI head 03/02/2013  FINDINGS: MRI HEAD FINDINGS  Negative for acute infarct. Mild chronic microvascular ischemic change in the white matter bilaterally. Negative for intracranial hemorrhage.  Meningioma of the left planum sphenoidale extends back to the sella. This shows homogeneous intense enhancement. On axial images the meningioma measures 18 x 21 mm and is similar to the prior study. No invasion of the cavernous sinus. The meningioma is touching the optic chiasm and optic tract on the left, similar to the prior study.  6 mm meningioma on the right posterior clinoid. This is unchanged from the prior study.  Right frontal plaque like meningioma measures approximately 4 mm in thickness and is similar to the prior study. No edema in the adjacent brain.  Ventricle size is normal.  No  shift of the midline structures.  MRA HEAD FINDINGS  Left vertebral artery dominant and widely patent. Left PICA patent. Nondominant right vertebral artery ends in PICA. The basilar is widely patent. Superior cerebellar and posterior cerebral arteries are patent. Left posterior communicating artery is patent. Moderate stenosis in the left P2 segment. Right PCA widely patent with mild disease distally.  Internal carotid artery patent bilaterally. Anterior and middle cerebral arteries  widely patent.  Negative for cerebral aneurysm.  IMPRESSION: Meningioma left planum sphenoidale is stable. No invasion of the cavernous sinus. Possible compression of the optic chiasm and optic tract on the right. Additional small meningioma right posterior clinoid also unchanged. Right frontal plaque like meningioma stable.  No acute infarct.  Moderate stenosis left PCA.  Mild disease distal right PCA.   Electronically Signed   By: Franchot Gallo M.D.   On: 10/25/2013 10:49   Mr Jodene Nam Head/brain Wo Cm  10/25/2013   CLINICAL DATA:  Loss of vision left eye.  Known meningioma.  EXAM: MRI HEAD WITHOUT AND WITH CONTRAST  MRA HEAD WITHOUT CONTRAST  TECHNIQUE: Multiplanar, multiecho pulse sequences of the brain and surrounding structures were obtained without and with intravenous contrast. Angiographic images of the head were obtained using MRA technique without contrast.  CONTRAST:  67mL MULTIHANCE GADOBENATE DIMEGLUMINE 529 MG/ML IV SOLN  COMPARISON:  MRI head 03/02/2013  FINDINGS: MRI HEAD FINDINGS  Negative for acute infarct. Mild chronic microvascular ischemic change in the white matter bilaterally. Negative for intracranial hemorrhage.  Meningioma of the left planum sphenoidale extends back to the sella. This shows homogeneous intense enhancement. On axial images the meningioma measures 18 x 21 mm and is similar to the prior study. No invasion of the cavernous sinus. The meningioma is touching the optic chiasm and optic tract on the left, similar to the prior study.  6 mm meningioma on the right posterior clinoid. This is unchanged from the prior study.  Right frontal plaque like meningioma measures approximately 4 mm in thickness and is similar to the prior study. No edema in the adjacent brain.  Ventricle size is normal.  No shift of the midline structures.  MRA HEAD FINDINGS  Left vertebral artery dominant and widely patent. Left PICA patent. Nondominant right vertebral artery ends in PICA. The basilar is widely  patent. Superior cerebellar and posterior cerebral arteries are patent. Left posterior communicating artery is patent. Moderate stenosis in the left P2 segment. Right PCA widely patent with mild disease distally.  Internal carotid artery patent bilaterally. Anterior and middle cerebral arteries widely patent.  Negative for cerebral aneurysm.  IMPRESSION: Meningioma left planum sphenoidale is stable. No invasion of the cavernous sinus. Possible compression of the optic chiasm and optic tract on the right. Additional small meningioma right posterior clinoid also unchanged. Right frontal plaque like meningioma stable.  No acute infarct.  Moderate stenosis left PCA.  Mild disease distal right PCA.   Electronically Signed   By: Franchot Gallo M.D.   On: 10/25/2013 10:49    Micro Results      No results found for this or any previous visit (from the past 240 hour(s)).   History of present illness and  Hospital Course:     Kindly see H&P for history of present illness and admission details, please review complete Labs, Consult reports and Test reports for all details in brief Melissa Snyder, is a 80 y.o. female, patient with history of  hypertension, hypothyroidism, GERD who woke up this morning at  a few minutes before 8 o'clock and noticed flashes of light out of her left eye. Within about 30 minutes she had complete loss of vision out of her left eye. She called her optometrist who saw today at noon. Per her report, he did a comprehensive eye exam he was not able to find any issues and sent her to the emergency department. Upon arrival to the ED an ophthalmology consult was requested and the diagnosis of central retinal artery occlusion of the left eye was made. It was by then too late in the course to administer thrombolytics. Ophthalmologist has recommended admission to the hospital for a stroke workup.     1. Acute loss of vision in the left eye due to ? left Central retinal artery occlusion (CVA) vs  compression by meningioma - on aspirin and statin for secondary prevention, and her by neurology-neurosurgery and ophthalmology. Per neurology continue aspirin along with statin, per neurosurgery no need for acute surgery Will follow outpatient, ophthalmology will follow outpatient as well. Carotid stable, LDL greater than 100, A1c stable, echogram reviewed as above shows inferior hypokinesis with EF 50% down from 65% 4 years ago. Have requested her to follow with cardiology outpatient for the same.   2. HTN - stable on atenolol.    3. Dyslipidemia. Started on statin.    4. Hypothyroidism. Continue home dose Synthroid.    5.GERD . Continue PPI no acute issues.    6. Lung nodule noted on 1 view chest x-ray. With PCP to pending a 2 view chest x-ray.           Today   Subjective:   Melissa Snyder today has no headache,no chest abdominal pain,no new weakness tingling or numbness, feels much better wants to go home today.  Continues to have near total blindness of the left eye.  Objective:   Blood pressure 149/78, pulse 62, temperature 97.9 F (36.6 C), temperature source Oral, resp. rate 18, height 5\' 6"  (1.676 m), weight 68.04 kg (150 lb), SpO2 95.00%.   Intake/Output Summary (Last 24 hours) at 10/26/13 1137 Last data filed at 10/25/13 2154  Gross per 24 hour  Intake    243 ml  Output      0 ml  Net    243 ml    Exam Awake Alert, Oriented x 3, No new F.N deficits, Normal affect, practically no vision in the left eye [place].AT,PERRAL Supple Neck,No JVD, No cervical lymphadenopathy appriciated.  Symmetrical Chest wall movement, Good air movement bilaterally, CTAB RRR,No Gallops,Rubs or new Murmurs, No Parasternal Heave +ve B.Sounds, Abd Soft, Non tender, No organomegaly appriciated, No rebound -guarding or rigidity. No Cyanosis, Clubbing or edema, No new Rash or bruise  Data Review   CBC w Diff: Lab Results  Component Value Date   WBC 6.1 10/24/2013   HGB 11.9*  10/24/2013   HCT 35.6* 10/24/2013   PLT 156 10/24/2013   LYMPHOPCT 28 12/20/2009   MONOPCT 7 12/20/2009   EOSPCT 8* 12/20/2009   BASOPCT 1 12/20/2009    CMP: Lab Results  Component Value Date   NA 140 10/24/2013   K 4.3 10/24/2013   CL 101 10/24/2013   CO2 27 10/24/2013   BUN 16 10/24/2013   CREATININE 0.84 10/24/2013  .  Lab Results  Component Value Date   HGBA1C 5.9* 10/25/2013    Lab Results  Component Value Date   CHOL 226* 10/25/2013   HDL 37* 10/25/2013   LDLCALC 147* 10/25/2013   TRIG 208*  10/25/2013   CHOLHDL 6.1 10/25/2013    Total Time in preparing paper work, data evaluation and todays exam - 35 minutes  Thurnell Lose M.D on 10/26/2013 at 11:37 AM  [facility] Office  [phone]   **Disclaimer: This note may have been dictated with voice recognition software. Similar sounding words can inadvertently be transcribed and this note may contain transcription errors which may not have been corrected upon publication of note.**

## 2013-10-26 NOTE — Progress Notes (Signed)
SLP Cancellation Note  Patient Details Name: Melissa Snyder MRN: [id number] DOB: February 26, 1934   Cancelled treatment:       Reason Eval/Treat Not Completed: SLP screened, no needs identified, will sign off   Assunta Curtis 10/26/2013, 10:08 AM

## 2013-10-26 NOTE — Discharge Instructions (Signed)
Follow with Primary MD  in 7 days   Get CBC, CMP, checked 7 days by Primary MD and again as instructed by your Primary MD. Get a 2 view Chest X ray done next visit.   Activity: As tolerated with Full fall precautions use walker/cane & assistance as needed   Disposition Home **   Diet: Heart Healthy ** , feeding assistance and aspiration precautions if needed.  For Heart failure patients - Check your Weight same time everyday, if you gain over 2 pounds, or you develop in leg swelling, experience more shortness of breath or chest pain, call your Primary MD immediately. Follow Cardiac Low Salt Diet and 1.8 lit/day fluid restriction.   On your next visit with her primary care physician please Get Medicines reviewed and adjusted.  Please request your Prim.MD to go over all Hospital Tests and Procedure/Radiological results at the follow up, please get all Hospital records sent to your Prim MD by signing hospital release before you go home.   If you experience worsening of your admission symptoms, develop shortness of breath, life threatening emergency, suicidal or homicidal thoughts you must seek medical attention immediately by calling 911 or calling your MD immediately  if symptoms less severe.  You Must read complete instructions/literature along with all the possible adverse reactions/side effects for all the Medicines you take and that have been prescribed to you. Take any new Medicines after you have completely understood and accpet all the possible adverse reactions/side effects.   Do not drive and provide baby sitting services if your were admitted for syncope or siezures until you have seen by Primary MD or a Neurologist and advised to do so again.  Do not drive when taking Pain medications.    Do not take more than prescribed Pain, Sleep and Anxiety Medications  Special Instructions: If you have smoked or chewed Tobacco  in the last 2 yrs please stop smoking, stop any regular  Alcohol  and or any Recreational drug use.  Wear Seat belts while driving.   Please note  You were cared for by a hospitalist during your hospital stay. If you have any questions about your discharge medications or the care you received while you were in the hospital after you are discharged, you can call the unit and asked to speak with the hospitalist on call if the hospitalist that took care of you is not available. Once you are discharged, your primary care physician will handle any further medical issues. Please note that NO REFILLS for any discharge medications will be authorized once you are discharged, as it is imperative that you return to your primary care physician (or establish a relationship with a primary care physician if you do not have one) for your aftercare needs so that they can reassess your need for medications and monitor your lab values.

## 2013-10-26 NOTE — Progress Notes (Signed)
Patient discharged home with spouse. Patient alert and oriented, maew, voiding adequate amount of urine. Patient and spouse stated understanding of discharge instructions, and the importance of all follow up appointments. Tomma Rakers RN

## 2013-10-26 NOTE — Progress Notes (Signed)
Stroke Team Progress Note  HISTORY Melissa Snyder is a 80 y.o. female who woke up with "scrambled vision" with her left eye upon waking (around 8 am) on 10/24/2013. No Headache, nausea or other associated symptoms. She called her optometrist who agreed to see her at noon. On the way to [facility], she noted that the vision went out completely OS. No pain. No weakness, numbness, dizziness or gait instability noted. After being seen there the patient was sent to the ED where she was evaluated by Ophthalmology and diagnosed with a central retinal artery occlusion.   Date last known well: Date: 10/23/2013  Time last known well: Time: 22:30  tPA Given: No: Outside time window    SUBJECTIVE Resting comfortably.    OBJECTIVE Most recent Vital Signs: Filed Vitals:   10/25/13 1932 10/25/13 2120 10/26/13 0106 10/26/13 0621  BP: 167/72 150/68 130/72 142/65  Pulse:  63 63 57  Temp:  98.3 F (36.8 C) 98.3 F (36.8 C) 98 F (36.7 C)  TempSrc:  Oral Oral Oral  Resp:  18 18 20   Height:      Weight:      SpO2:  97% 97% 96%   CBG (last 3)  No results found for this basename: GLUCAP,  in the last 72 hours  IV Fluid Intake:     MEDICATIONS  . aspirin  81 mg Oral Daily  . atenolol  50 mg Oral QHS  . atorvastatin  20 mg Oral q1800  . beta carotene w/minerals  1 tablet Oral Daily  . calcium-vitamin D  1 tablet Oral Q breakfast  . enoxaparin (LOVENOX) injection  40 mg Subcutaneous Q24H  . levothyroxine  112 mcg Oral QAC breakfast  . multivitamin with minerals  1 tablet Oral Daily  . omega-3 acid ethyl esters  1 g Oral BID  . pantoprazole  40 mg Oral Daily  . sodium chloride  3 mL Intravenous Q12H   PRN:  sodium chloride, acetaminophen, acetaminophen, senna-docusate, sodium chloride  Diet:  Cardiac thin liquids Activity:  Up with assistance DVT Prophylaxis:  Lovenox  CLINICALLY SIGNIFICANT STUDIES Basic Metabolic Panel:   Recent Labs Lab 10/24/13 1324 10/24/13 2108  NA 140  --    K 4.3  --   CL 101  --   CO2 27  --   GLUCOSE 90  --   BUN 16  --   CREATININE 0.66 0.84  CALCIUM 9.5  --    Liver Function Tests: No results found for this basename: AST, ALT, ALKPHOS, BILITOT, PROT, ALBUMIN,  in the last 168 hours CBC:   Recent Labs Lab 10/24/13 1324 10/24/13 2108  WBC 6.3 6.1  HGB 13.1 11.9*  HCT 39.4 35.6*  MCV 85.8 86.0  PLT 182 156   Coagulation:   Recent Labs Lab 10/24/13 1324  LABPROT 13.1  INR 1.01   Cardiac Enzymes: No results found for this basename: CKTOTAL, CKMB, CKMBINDEX, TROPONINI,  in the last 168 hours Urinalysis: No results found for this basename: COLORURINE, APPERANCEUR, LABSPEC, PHURINE, GLUCOSEU, HGBUR, BILIRUBINUR, KETONESUR, PROTEINUR, UROBILINOGEN, NITRITE, LEUKOCYTESUR,  in the last 168 hours Lipid Panel    Component Value Date/Time   CHOL 226* 10/25/2013 0650   TRIG 208* 10/25/2013 0650   HDL 37* 10/25/2013 0650   CHOLHDL 6.1 10/25/2013 0650   VLDL 42* 10/25/2013 0650   LDLCALC 147* 10/25/2013 0650   HgbA1C  Lab Results  Component Value Date   HGBA1C 5.9* 10/25/2013    Urine Drug  Screen:   No results found for this basename: labopia,  cocainscrnur,  labbenz,  amphetmu,  thcu,  labbarb    Alcohol Level: No results found for this basename: ETH,  in the last 168 hours  Ct Head Wo Contrast 10/24/2013    Left suprasellar mass is again noted and consistent with meningioma seen on prior MRI exam. Minimal chronic ischemic white matter disease is noted. No other significant intracranial abnormality seen.       MRI of the brain    Meningioma left planum sphenoidale is stable. No invasion of the  cavernous sinus. Possible compression of the optic chiasm and optic  tract on the right. Additional small meningioma right posterior  clinoid also unchanged. Right frontal plaque like meningioma stable.    2D Echocardiogram  pending  Carotid Doppler  pending  CXR  pending  EKG - Sinus rhythm rate 62 beats per minute with left  bundle branch block.  For complete results please see formal report.   Therapy Recommendations pending  Physical Exam   Mental Status:  Alert, oriented, thought content appropriate. Speech fluent without evidence of aphasia. Able to follow 3 step commands without difficulty.  Cranial Nerves:  II:  Visual fields grossly normal OD with no vision reported OS, pupils dilated. Minimally reactive on the right and unreactive on the left  III,IV, VI: ptosis not present, extra-ocular motions intact bilaterally  V,VII: smile symmetric, facial light touch sensation normal bilaterally  VIII: hearing normal bilaterally  IX,X: gag reflex present  XI: bilateral shoulder shrug  XII: midline tongue extension  Motor:  Right : Upper extremity 5/5 Left: Upper extremity 5/5  Lower extremity 5/5 Lower extremity 5/5  Tone and bulk:normal tone throughout; no atrophy noted  Sensory: Pinprick and light touch intact throughout, bilaterally  Deep Tendon Reflexes: 2+ in the upper extremities, 1+ at the knees and absent at the ankles.  Plantars:  Right: downgoing Left: downgoing  Cerebellar:  normal finger-to-nose and normal heel-to-shin test  Gait: Deferred     ASSESSMENT Ms. Melissa Snyder is a 80 y.o. female presenting with loss of vision in the left eye. TPA was not initiated secondary to late presentation. A CT scan of the head showed no acute finding. On no antithrombotics prior to admission. Now on aspirin 81 mg orally every day for secondary stroke prevention. Patient with resultant loss of vision in the left eye. Stroke work up underway. Differential is likely central retinal artery occlusion vs optic nerve/chiasm related to meningioma, though suspect this is less likely.    Hyperlipidemia - cholesterol 226 LDL 147 - no statin prior to admission ( niacin allergy )  Hypertension  History of migraine headaches  Left suprasellar mass consistent with meningioma   Hospital day #  2  TREATMENT/PLAN  Continue aspirin 81 mg orally every day for secondary stroke prevention.  Carotid dopplers show bilateral 1-39% ICA stenosis  Therapy evaluations complete  Add statin for hyperlipidemia  Await  2-D echo results  Outpatient follow up with ophthalmology  Follow up with Dr Leonie Man in 2 months  Please call with further questions    Jim Like, DO [facility] Pager: [phone]    To contact Stroke Continuity provider, please refer to [email or web address] After hours, contact General Neurology

## 2013-10-27 ENCOUNTER — Ambulatory Visit
Admission: RE | Admit: 2013-10-27 | Discharge: 2013-10-27 | Disposition: A | Discharge status: Home / Self Care | Payer: Medicare HMO | Source: Ambulatory Visit | Attending: Internal Medicine | Admitting: Internal Medicine

## 2013-10-27 ENCOUNTER — Other Ambulatory Visit: Payer: Self-pay | Admitting: Internal Medicine

## 2013-10-27 DIAGNOSIS — J984 Other disorders of lung: Secondary | ICD-10-CM

## 2013-10-27 NOTE — Progress Notes (Signed)
UR complete.  Zlatan Hornback RN, MSN 

## 2013-10-28 ENCOUNTER — Other Ambulatory Visit: Payer: Self-pay | Admitting: Internal Medicine

## 2013-10-28 DIAGNOSIS — R911 Solitary pulmonary nodule: Secondary | ICD-10-CM

## 2013-11-02 DIAGNOSIS — D0330 Melanoma in situ of unspecified part of face: Secondary | ICD-10-CM

## 2013-11-02 HISTORY — DX: Melanoma in situ of unspecified part of face: D03.30

## 2013-11-04 ENCOUNTER — Other Ambulatory Visit: Payer: Self-pay | Admitting: Dermatology

## 2013-11-12 ENCOUNTER — Other Ambulatory Visit: Payer: Self-pay | Admitting: Internal Medicine

## 2013-11-12 ENCOUNTER — Ambulatory Visit
Admission: RE | Admit: 2013-11-12 | Discharge: 2013-11-12 | Disposition: A | Discharge status: Home / Self Care | Payer: Medicare HMO | Source: Ambulatory Visit | Attending: Internal Medicine | Admitting: Internal Medicine

## 2013-11-12 DIAGNOSIS — R911 Solitary pulmonary nodule: Secondary | ICD-10-CM

## 2013-11-30 ENCOUNTER — Other Ambulatory Visit: Payer: Self-pay | Admitting: Dermatology

## 2013-12-02 ENCOUNTER — Encounter: Payer: Self-pay | Admitting: Cardiology

## 2014-01-04 ENCOUNTER — Encounter: Payer: Self-pay | Admitting: *Deleted

## 2014-01-04 DIAGNOSIS — D329 Benign neoplasm of meninges, unspecified: Secondary | ICD-10-CM | POA: Insufficient documentation

## 2014-01-06 ENCOUNTER — Encounter: Payer: Medicare HMO | Admitting: Cardiology

## 2014-01-12 ENCOUNTER — Encounter: Payer: Self-pay | Admitting: Cardiology

## 2014-01-12 ENCOUNTER — Ambulatory Visit (INDEPENDENT_AMBULATORY_CARE_PROVIDER_SITE_OTHER): Payer: Medicare HMO | Admitting: Cardiology

## 2014-01-12 VITALS — BP 142/70 | HR 63 | Ht 66.0 in | Wt 146.6 lb

## 2014-01-12 DIAGNOSIS — E785 Hyperlipidemia, unspecified: Secondary | ICD-10-CM | POA: Insufficient documentation

## 2014-01-12 NOTE — Patient Instructions (Signed)
Your physician recommends that you continue on your current medications as directed. Please refer to the Current Medication list given to you today.   Your physician wants you to follow-up in: ONE YEAR WITH DR NELSON You will receive a reminder letter in the mail two months in advance. If you don't receive a letter, please call our office to schedule the follow-up appointment.  

## 2014-01-12 NOTE — Progress Notes (Signed)
Patient ID: Melissa Snyder, female   DOB: 1934-04-26, 80 y.o.   MRN: [id number]    Patient Name: Melissa Snyder Date of Encounter: 01/12/2014  Primary Care Provider:  Jerlyn Ly, MD Primary Cardiologist:  Dorothy Spark  Problem List   Past Medical History  Diagnosis Date  . Chest pain   . GERD (gastroesophageal reflux disease)   . Bundle branch block left   . Hyperlipidemia   . Hypertension   . Hypothyroid   . Migraine    Past Surgical History  Procedure Laterality Date  . Cardiac catheterization      Allergies  Allergies  Allergen Reactions  . Tramadol Itching  . Ibuprofen Rash and Other (See Comments)    Causes mouth sores  . Niacin And Related Other (See Comments)    Flushing, burning, redness    HPI  A very pleasant 80 year old female with prior medical history of hypertension and hyperlipidemia who was seen in the past in our office and underwent cardiac catheterization in 2004 for recurrent chest pain. Her cath was normal and left ventricular function was normal. Patient will call for followup and she was asymptomatic. She was recently hospitalized for central retinal artery occlusion in the left eye in May 2015. She just underwent excision of melanoma of on her left cheek the last week. She states that still these problems obscured she has been very active going to do by 3 times a week and not experiencing any shortness of breath and she stopped exercising 3 months ago she feels more tired and gets short of breath with exertion she denies any chest pain palpitations or syncope, lower extremity edema orthopnea or paroxysmal nocturnal dyspnea.  Home Medications  Prior to Admission medications   Medication Sig Start Date End Date Taking? Authorizing Provider  aspirin 81 MG chewable tablet Chew 1 tablet (81 mg total) by mouth daily. 10/26/13  Yes Thurnell Lose, MD  atenolol (TENORMIN) 50 MG tablet Take 50 mg by mouth at bedtime.    Yes Historical Provider,  MD  b complex vitamins capsule Take 1 capsule by mouth daily.   Yes Historical Provider, MD  calcium-vitamin D (OSCAL WITH D) 500-200 MG-UNIT per tablet Take 1 tablet by mouth every morning.   Yes Historical Provider, MD  Digestive Enzymes (DIGESTIVE ENZYME PO) Take 1 capsule by mouth as needed.   Yes Historical Provider, MD  fish oil-omega-3 fatty acids 1000 MG capsule Take 1 g by mouth every morning.    Yes Historical Provider, MD  levothyroxine (SYNTHROID, LEVOTHROID) 112 MCG tablet Take 112 mcg by mouth every morning.    Yes Historical Provider, MD  Multiple Vitamin (MULTIVITAMIN) tablet Take 1 tablet by mouth every morning.    Yes Historical Provider, MD  Multiple Vitamins-Minerals (VISION FORMULA PO) Take 1 capsule by mouth daily.   Yes Historical Provider, MD  vitamin E (E-400) 400 UNIT capsule Take 400 Units by mouth daily.   Yes Historical Provider, MD    Family History  Family History  Problem Relation Age of Onset  . CAD Mother     CABG  . Renal Disease Father     Social History  History   Social History  . Marital Status: Married    Spouse Name: N/A    Number of Children: N/A  . Years of Education: N/A   Occupational History  . Not on file.   Social History Main Topics  . Smoking status: Never Smoker   . Smokeless  tobacco: Never Used  . Alcohol Use: No  . Drug Use: No  . Sexual Activity: Not on file   Other Topics Concern  . Not on file   Social History Narrative  . No narrative on file     Review of Systems, as per HPI, otherwise negative General:  No chills, fever, night sweats or weight changes.  Cardiovascular:  No chest pain, dyspnea on exertion, edema, orthopnea, palpitations, paroxysmal nocturnal dyspnea. Dermatological: No rash, lesions/masses Respiratory: No cough, dyspnea Urologic: No hematuria, dysuria Abdominal:   No nausea, vomiting, diarrhea, bright red blood per rectum, melena, or hematemesis Neurologic:  No visual changes, wkns,  changes in mental status. All other systems reviewed and are otherwise negative except as noted above.  Physical Exam  Blood pressure 142/70, pulse 63, height 5\' 6"  (1.676 m), weight 146 lb 9.6 oz (66.497 kg).  General: Pleasant, NAD Psych: Normal affect. Neuro: Alert and oriented X 3. Moves all extremities spontaneously. HEENT: Normal  Neck: Supple without bruits or JVD. Lungs:  Resp regular and unlabored, CTA. Heart: RRR no s3, s4, or murmurs. Abdomen: Soft, non-tender, non-distended, BS + x 4.  Extremities: No clubbing, cyanosis or edema. DP/PT/Radials 2+ and equal bilaterally.  Labs:  No results found for this basename: CKTOTAL, CKMB, TROPONINI,  in the last 72 hours Lab Results  Component Value Date   WBC 6.1 10/24/2013   HGB 11.9* 10/24/2013   HCT 35.6* 10/24/2013   MCV 86.0 10/24/2013   PLT 156 10/24/2013    No results found for this basename: DDIMER   No components found with this basename: POCBNP,     Component Value Date/Time   NA 140 10/24/2013 1324   K 4.3 10/24/2013 1324   CL 101 10/24/2013 1324   CO2 27 10/24/2013 1324   GLUCOSE 90 10/24/2013 1324   BUN 16 10/24/2013 1324   CREATININE 0.84 10/24/2013 2108   CALCIUM 9.5 10/24/2013 1324   GFRNONAA 65* 10/24/2013 2108   GFRAA 75* 10/24/2013 2108   Lab Results  Component Value Date   CHOL 226* 10/25/2013   HDL 37* 10/25/2013   LDLCALC 147* 10/25/2013   TRIG 208* 10/25/2013   Accessory Clinical Findings  Echocardiogram 10/25/2013 - Left ventricle: Poor image quality may be inferior hypokinesis. The cavity size was normal. Wall thickness was normal. Systolic function was normal. The estimated ejection fraction was in the range of 50% to 55%. - Aortic valve: There was mild regurgitation. - Mitral valve: There was mild regurgitation.  ECG - 10/24/2013 - SR, LBBB  Cardiac cath 2004: ANGIOGRAPHIC DATA:  1. Left coronary artery arises and distributes normally.  2. The left main coronary artery is normal.  3. The left  anterior descending artery and its branches are normal.  4. The left circumflex coronary artery is normal.  5. The right coronary artery has an inferior takeoff. It has mild  irregularity proximally up to 10%. Otherwise it appears normal.  6. Left ventricular angiography performed in the RAO and LAO cranial views  demonstrates normal left ventricular size and contractility with normal  systolic function. Ejection fraction is estimated at 65%. There is no  significant mitral valve prolapse or insufficiency. The aortic valve  appears normal. The aortic root appears normal.  FINAL INTERPRETATION:  1. Normal coronary anatomy.  2. Normal left ventricular function.  Peter M. Martinique, M.D.   Assessment & Plan  1.  posthospitalization followup - the patient is doing well and doesn't have any signs of unstable  angina or congestive heart failure. She appears deconditioned so she is advised that once he starts exercising and continues feeling short of breath after a couple months of exercise she should follow with [place] and stress test might be considered.  2. Hypertension - well controlled on current regimen  3. Hyperlipidemia - elevated triglycerides and LDL, followed by primary care physician and currently only on fish oil. She is advised on proper diet prefers not to be on statin.   Followup in 1 year  Dorothy Spark, MD, [facility] 01/12/2014, 10:09 AM

## 2014-02-09 ENCOUNTER — Other Ambulatory Visit: Payer: Medicare HMO

## 2014-02-11 ENCOUNTER — Ambulatory Visit
Admission: RE | Admit: 2014-02-11 | Discharge: 2014-02-11 | Disposition: A | Discharge status: Home / Self Care | Payer: Medicare HMO | Source: Ambulatory Visit | Attending: Internal Medicine | Admitting: Internal Medicine

## 2014-02-11 DIAGNOSIS — R911 Solitary pulmonary nodule: Secondary | ICD-10-CM

## 2014-02-11 MED ORDER — IOHEXOL 300 MG/ML  SOLN
75.0000 mL | Freq: Once | INTRAMUSCULAR | Status: AC | PRN
  Administered 2014-02-11: 75 mL via INTRAVENOUS

## 2014-02-12 ENCOUNTER — Other Ambulatory Visit (HOSPITAL_COMMUNITY): Payer: Self-pay | Admitting: Internal Medicine

## 2014-02-12 DIAGNOSIS — R911 Solitary pulmonary nodule: Secondary | ICD-10-CM

## 2014-02-15 ENCOUNTER — Other Ambulatory Visit: Payer: Medicare HMO

## 2014-02-23 ENCOUNTER — Encounter (HOSPITAL_COMMUNITY): Payer: Self-pay

## 2014-02-23 ENCOUNTER — Ambulatory Visit (HOSPITAL_COMMUNITY)
Admission: RE | Admit: 2014-02-23 | Discharge: 2014-02-23 | Disposition: A | Discharge status: Home / Self Care | Payer: Medicare HMO | Source: Ambulatory Visit | Attending: Internal Medicine | Admitting: Internal Medicine

## 2014-02-23 DIAGNOSIS — R911 Solitary pulmonary nodule: Secondary | ICD-10-CM | POA: Insufficient documentation

## 2014-02-23 LAB — GLUCOSE, CAPILLARY: GLUCOSE-CAPILLARY: 90 mg/dL (ref 70–99)

## 2014-02-23 MED ORDER — FLUDEOXYGLUCOSE F - 18 (FDG) INJECTION: 7.3000 | Freq: Once | INTRAVENOUS | Status: AC | PRN

## 2014-03-01 ENCOUNTER — Encounter: Payer: Self-pay | Admitting: *Deleted

## 2014-03-01 DIAGNOSIS — M1990 Unspecified osteoarthritis, unspecified site: Secondary | ICD-10-CM | POA: Insufficient documentation

## 2014-03-01 DIAGNOSIS — D0330 Melanoma in situ of unspecified part of face: Secondary | ICD-10-CM

## 2014-03-01 DIAGNOSIS — I10 Essential (primary) hypertension: Secondary | ICD-10-CM | POA: Insufficient documentation

## 2014-03-01 DIAGNOSIS — E039 Hypothyroidism, unspecified: Secondary | ICD-10-CM | POA: Insufficient documentation

## 2014-03-02 ENCOUNTER — Institutional Professional Consult (permissible substitution) (INDEPENDENT_AMBULATORY_CARE_PROVIDER_SITE_OTHER): Payer: Medicare HMO | Admitting: Thoracic Surgery (Cardiothoracic Vascular Surgery)

## 2014-03-02 ENCOUNTER — Encounter: Payer: Self-pay | Admitting: Thoracic Surgery (Cardiothoracic Vascular Surgery)

## 2014-03-02 VITALS — BP 170/79 | HR 70 | Ht 66.0 in | Wt 146.0 lb

## 2014-03-02 DIAGNOSIS — R599 Enlarged lymph nodes, unspecified: Secondary | ICD-10-CM

## 2014-03-02 DIAGNOSIS — R918 Other nonspecific abnormal finding of lung field: Secondary | ICD-10-CM

## 2014-03-02 DIAGNOSIS — R590 Localized enlarged lymph nodes: Secondary | ICD-10-CM

## 2014-03-02 NOTE — Progress Notes (Signed)
PCP is Jerlyn Ly, MD Referring Provider is Perini, Jeannette How, MD  Chief Complaint  Patient presents with  . NEW THORACIC    LUNG NODULE    HPI: 80 year old woman who presents for evaluation of a lung nodule and mediastinal adenopathy.  Melissa Snyder is a 80 year old woman who was in her usual state of health until May of this year, when she presented with blindness of her left eye. This initially was thought to be stroke turned out to be due to a meningioma. As part of her workup she had a chest x-ray which led to a CT of the chest. She was noted to have multiple groundglass nodules the largest being 13 mm in the superior segment of the left lower lobe. She subsequently had a repeat CT of the chest and this was followed with a PET CT.  She also recently had surgery for melanoma on her left cheek.  She is a lifelong nonsmoker. She has no known asbestos exposure. She says that her weight has been stable. She has had a nonproductive cough. She denies hemoptysis. She has not had any issues with loss of appetite.   Past Medical History  Diagnosis Date  . Chest pain   . Bundle branch block left   . Migraine   . Hypothyroid   . Hypertension   . Arthritis     OA  . Hyperlipidemia   . GERD (gastroesophageal reflux disease)   . Melanoma in situ of face JUNE 2015    LEFT CHEEK    Past Surgical History  Procedure Laterality Date  . Cardiac catheterization    . Dilation and curettage of uterus      X 2  . Abdominal hysterectomy    . Bilateral salpingoophorectomy    . Bladder repair    . Cholecystectomy    . Melanoma excision Left 11/05/23, 11/30/13    CHEEK    Family History  Problem Relation Age of Onset  . CAD Mother     CABG  . Hyperlipidemia Mother   . AAA (abdominal aortic aneurysm) Mother   . Renal Disease Father   . Kidney disease Father   . CAD Father   . Cancer Father     PROSTATE  . Diabetes Brother   . Hyperlipidemia Brother   . Cancer Daughter 11    BREAST  .  Cancer Sister     BREAST  . Cancer Sister     BREAST    Social History History  Substance Use Topics  . Smoking status: Never Smoker   . Smokeless tobacco: Never Used  . Alcohol Use: No    Current Outpatient Prescriptions  Medication Sig Dispense Refill  . aspirin 81 MG chewable tablet Chew 1 tablet (81 mg total) by mouth daily.  30 tablet  0  . atenolol (TENORMIN) 50 MG tablet Take 50 mg by mouth at bedtime.       Marland Kitchen b complex vitamins capsule Take 1 capsule by mouth daily.      . calcium-vitamin D (OSCAL WITH D) 500-200 MG-UNIT per tablet Take 1 tablet by mouth every morning.      . Digestive Enzymes (DIGESTIVE ENZYME PO) Take 1 capsule by mouth as needed.      . fish oil-omega-3 fatty acids 1000 MG capsule Take 1 g by mouth every morning.       Marland Kitchen levothyroxine (SYNTHROID, LEVOTHROID) 112 MCG tablet Take 112 mcg by mouth every morning.       Marland Kitchen  Multiple Vitamin (MULTIVITAMIN) tablet Take 1 tablet by mouth every morning.       . Multiple Vitamins-Minerals (VISION FORMULA PO) Take 1 capsule by mouth daily.      . vitamin E (E-400) 400 UNIT capsule Take 400 Units by mouth daily.       No current facility-administered medications for this visit.    Allergies  Allergen Reactions  . Tramadol Itching  . Ibuprofen Rash and Other (See Comments)    Causes mouth sores  . Niacin And Related Other (See Comments)    Flushing, burning, redness    Review of Systems  Constitutional: Negative for fever, activity change, appetite change and unexpected weight change.  Eyes: Positive for visual disturbance (Blind in left eye due to "stroke" 10/24/2013).  Respiratory: Positive for cough (Nonproductive).   Cardiovascular: Negative for chest pain.  Gastrointestinal:       Reflux  Skin:       Recent melanoma surgery left cheek with skin flap  All other systems reviewed and are negative.   BP 170/79  Pulse 70  Ht 5\' 6"  (1.676 m)  Wt 146 lb (66.225 kg)  BMI 23.58 kg/m2  SpO2  98% Physical Exam  Vitals reviewed. Constitutional: She is oriented to person, place, and time. She appears well-developed and well-nourished. No distress.  HENT:  Head: Normocephalic.  Healing skin flap and scar left cheek  Neck: Neck supple. No thyromegaly present.  Cardiovascular: Normal rate, regular rhythm and normal heart sounds.   No murmur heard. Pulmonary/Chest: Effort normal and breath sounds normal. She has no wheezes.  Abdominal: Soft. There is no tenderness.  Lymphadenopathy:    She has no cervical adenopathy.  Neurological: She is alert and oriented to person, place, and time.  Blind left eye  Skin: Skin is warm and dry.  Left cheek as noted     Diagnostic Tests: CT CHEST WITH CONTRAST  TECHNIQUE:  Multidetector CT imaging of the chest was performed during  intravenous contrast administration.  CONTRAST: 44mL OMNIPAQUE IOHEXOL 300 MG/ML SOLN  COMPARISON: 11/12/2013  FINDINGS:  Lungs are adequately inflated without focal consolidation or  effusion. Biapical pleural thickening is present and unchanged.  There are 3 right lung nodules and 4 left lung nodules which are all  faint in density and unchanged. These are all less than a cm in size  with the exception of an ill-defined and somewhat faint nodular  opacity likely partly solid measuring approximate 1.3 cm over the  superior segment left lower lobe which is unchanged. There are no  new nodules identified.  Heart is normal in size. There is no hilar, mediastinal or axillary  adenopathy. Remaining mediastinal structures are within normal.  Images through the upper abdomen are unremarkable. There are mild  degenerative changes of the spine.  IMPRESSION:  No acute findings.  Several small bilateral faint nodular densities which are unchanged.  The largest measures 1.3 cm over the superior segment left lower  lobe which is unchanged. No new no new nodules identified. No  adenopathy. Cannot exclude metastatic  disease in light of patient's  history of melanoma. Recommend PET-CT to assess metabolic function  of the largest 1.3 cm partly solid nodule superior segment left  lower lobe.  Electronically Signed  By: Marin Olp M.D.  On: 02/11/2014 09:47  NUCLEAR MEDICINE PET SKULL BASE TO THIGH  TECHNIQUE:  7.3 MCi F-18 FDG was injected intravenously. Full-ring PET imaging  was performed from the skull base to thigh after the  radiotracer. CT  data was obtained and used for attenuation correction and anatomic  localization.  FASTING BLOOD GLUCOSE: Value: 90 mg/dl  COMPARISON: CT 02/11/2014  FINDINGS:  NECK  No hypermetabolic lymph nodes in the neck.  CHEST  In the superior segment of the left lower lobe there is a 15 mm  semi-solid nodule with peripheral ground-glass opacity nodule which  is not changed significantly from CT of 11/12/2013. Lesion is very  low metabolic activity with SUV max equal 1.1. There are smaller  scattered nodules which also do not hypermetabolic.  There is a hypermetabolic subcarinal lymph node with SUV max 5.9.  Bilateral hilar lymph nodes are symmetric and mildly hypermetabolic.  ABDOMEN/PELVIS  No abnormal hypermetabolic activity within the liver, pancreas,  adrenal glands, or spleen. No hypermetabolic lymph nodes in the  abdomen or pelvis.  SKELETON  No focal hypermetabolic activity to suggest skeletal metastasis.  IMPRESSION:  1. Persistent semi solid nodule in the superior segment left lower  lobe with low metabolic activity. Cannot exclude a low-grade  adenocarcinoma. Thoracic surgery consultation is recommended.  2. Moderately metabolic subcarinal and hilar lymph nodes are likely  reactive.  These recommendations are taken from: Recommendations for the  Management of Subsolid Pulmonary Nodules Detected at CT: A Statement  from the [facility] 2013; 266:1, [phone].  Electronically Signed  By: Suzy Bouchard M.D.  On: 02/23/2014  14:10   Impression: 80 year old woman with multiple lung groundglass opacities bilaterally the largest being 13 mm to 16 mm in size in the superior segment of the left lower lobe. There is no soft tissue component visible on CT and the SUV was 1.1. If anything this could be a very low grade adenocarcinoma in situ. Given the absence of a soft tissue component, low metabolic rate, and multiple nodules bilaterally, I would favor continuing to follow this.  She did have moderate activity in a subcarinal node. The node itself appears unremarkable. Bilateral hilar nodes were mildly hypermetabolic as well. The radiologist felt these nodes were most likely reactive in nature  I reviewed the CT and PET images with Mr. and Melissa Snyder. We discussed the differential diagnosis. We discussed potential diagnostic and treatment approaches. After discussion they agree with my recommendation to repeat his CT in 3 months.  Plan: Return in 3 months with a repeat CT of chest

## 2014-03-16 ENCOUNTER — Other Ambulatory Visit: Payer: Self-pay

## 2014-03-16 DIAGNOSIS — Z1231 Encounter for screening mammogram for malignant neoplasm of breast: Secondary | ICD-10-CM

## 2014-03-19 ENCOUNTER — Other Ambulatory Visit: Payer: Self-pay

## 2014-04-12 ENCOUNTER — Other Ambulatory Visit: Payer: Self-pay | Admitting: *Deleted

## 2014-04-12 DIAGNOSIS — R911 Solitary pulmonary nodule: Secondary | ICD-10-CM

## 2014-04-28 ENCOUNTER — Ambulatory Visit
Admission: RE | Admit: 2014-04-28 | Discharge: 2014-04-28 | Disposition: A | Discharge status: Home / Self Care | Payer: Medicare HMO | Source: Ambulatory Visit

## 2014-04-28 DIAGNOSIS — Z1231 Encounter for screening mammogram for malignant neoplasm of breast: Secondary | ICD-10-CM

## 2014-05-04 ENCOUNTER — Other Ambulatory Visit: Payer: Self-pay | Admitting: Internal Medicine

## 2014-05-04 DIAGNOSIS — C50919 Malignant neoplasm of unspecified site of unspecified female breast: Secondary | ICD-10-CM

## 2014-05-04 DIAGNOSIS — C50411 Malignant neoplasm of upper-outer quadrant of right female breast: Secondary | ICD-10-CM | POA: Insufficient documentation

## 2014-05-04 DIAGNOSIS — R928 Other abnormal and inconclusive findings on diagnostic imaging of breast: Secondary | ICD-10-CM

## 2014-05-04 HISTORY — DX: Malignant neoplasm of unspecified site of unspecified female breast: C50.919

## 2014-05-17 ENCOUNTER — Other Ambulatory Visit: Payer: Self-pay | Admitting: Internal Medicine

## 2014-05-17 DIAGNOSIS — R928 Other abnormal and inconclusive findings on diagnostic imaging of breast: Secondary | ICD-10-CM

## 2014-05-19 ENCOUNTER — Ambulatory Visit
Admission: RE | Admit: 2014-05-19 | Discharge: 2014-05-19 | Disposition: A | Discharge status: Home / Self Care | Payer: Medicare HMO | Source: Ambulatory Visit | Attending: Internal Medicine | Admitting: Internal Medicine

## 2014-05-19 ENCOUNTER — Other Ambulatory Visit: Payer: Self-pay | Admitting: Internal Medicine

## 2014-05-19 DIAGNOSIS — R928 Other abnormal and inconclusive findings on diagnostic imaging of breast: Secondary | ICD-10-CM

## 2014-05-24 ENCOUNTER — Other Ambulatory Visit: Payer: Self-pay | Admitting: *Deleted

## 2014-05-25 ENCOUNTER — Encounter: Payer: Self-pay | Admitting: Thoracic Surgery (Cardiothoracic Vascular Surgery)

## 2014-05-25 ENCOUNTER — Ambulatory Visit
Admission: RE | Admit: 2014-05-25 | Discharge: 2014-05-25 | Disposition: A | Discharge status: Home / Self Care | Payer: Medicare HMO | Source: Ambulatory Visit | Attending: Thoracic Surgery (Cardiothoracic Vascular Surgery) | Admitting: Thoracic Surgery (Cardiothoracic Vascular Surgery)

## 2014-05-25 ENCOUNTER — Ambulatory Visit (INDEPENDENT_AMBULATORY_CARE_PROVIDER_SITE_OTHER): Payer: Medicare HMO | Admitting: Thoracic Surgery (Cardiothoracic Vascular Surgery)

## 2014-05-25 VITALS — BP 154/80 | HR 78 | Resp 16 | Ht 66.0 in | Wt 144.0 lb

## 2014-05-25 DIAGNOSIS — R911 Solitary pulmonary nodule: Secondary | ICD-10-CM

## 2014-05-25 DIAGNOSIS — R918 Other nonspecific abnormal finding of lung field: Secondary | ICD-10-CM

## 2014-05-25 DIAGNOSIS — R599 Enlarged lymph nodes, unspecified: Secondary | ICD-10-CM

## 2014-05-25 DIAGNOSIS — R590 Localized enlarged lymph nodes: Secondary | ICD-10-CM

## 2014-05-25 NOTE — Progress Notes (Signed)
HPI:  HPI: 80 year old woman who presents a scheduled follow-up visit regarding lung nodules and mediastinal adenopathy.  Melissa Snyder is a 80 year old woman who was in her usual state of health until May of 2015, when she presented with blindness of her left eye. This initially was thought to be stroke, but turned out to be due to a meningioma. As part of her workup she had a chest x-ray which led to a CT of the chest. She was noted to have multiple groundglass nodules, the largest being 13 mm in the superior segment of the left lower lobe. She subsequently had a repeat CT of the chest and this was followed with a PET CT. the SUV of the left lower lobe nodule was only 1.1. There was a subcarinal node that was not enlarged but was hypermetabolic with an SUV of 5.8.  She recently had surgery for melanoma on her left cheek.  Since her last visit she's been diagnosed with breast cancer and is scheduled to meet with a surgeon next week.  She is a lifelong nonsmoker. She has no known asbestos exposure. She says that her weight has been stable. She has had a nonproductive cough. She denies hemoptysis. She has not had any issues with loss of appetite.   Current Outpatient Prescriptions  Medication Sig Dispense Refill  . aspirin 81 MG chewable tablet Chew 1 tablet (81 mg total) by mouth daily. 30 tablet 0  . atenolol (TENORMIN) 50 MG tablet Take 50 mg by mouth at bedtime.     Marland Kitchen b complex vitamins capsule Take 1 capsule by mouth daily.    . calcium-vitamin D (OSCAL WITH D) 500-200 MG-UNIT per tablet Take 1 tablet by mouth every morning.    . Digestive Enzymes (DIGESTIVE ENZYME PO) Take 1 capsule by mouth as needed.    . fish oil-omega-3 fatty acids 1000 MG capsule Take 1 g by mouth every morning.     Marland Kitchen levothyroxine (SYNTHROID, LEVOTHROID) 112 MCG tablet Take 112 mcg by mouth every morning.     . Multiple Vitamin (MULTIVITAMIN) tablet Take 1 tablet by mouth every morning.     . Multiple  Vitamins-Minerals (VISION FORMULA PO) Take 1 capsule by mouth daily.    . vitamin E (E-400) 400 UNIT capsule Take 400 Units by mouth daily.     No current facility-administered medications for this visit.    Physical Exam 80 yo woman in NAD Well-developed well-nourished No cervical or supraclavicular adenopathy Lungs clear with equal breath sounds bilaterally  Diagnostic Tests: CT CHEST WITHOUT CONTRAST  TECHNIQUE: Multidetector CT imaging of the chest was performed following the standard protocol without IV contrast.  COMPARISON: Multiple exams, including 02/23/2014  FINDINGS: Aortic and branch vessel atherosclerotic calcification. Coronary artery atherosclerosis.  Biapical calcified pleural parenchymal scarring. No pathologic thoracic adenopathy identified. The scattered smaller ground-glass density nodules in the lungs appear stable. The dominant ground-glass opacity in the left lower lobe measures 1.6 by 1.3 cm, stable by my measurements, on image 24 series 4. This has a part solid component measuring about 6 mm inferiorly and, given its persistence. No new nodules identified. Thoracic kyphosis observed. Thoracic spondylosis noted.  IMPRESSION: 1. Persistence and similar size of the dominant left lower lobe sub solid nodule. Adenocarcinoma is considered. Biopsy or resection should be strongly considered. These recommendations are taken from:Recommendations for the Management of Subsolid Pulmonary Nodules Detected at CT: A Statement from the [place], Radiology 2013; 266:1, 304-317. 2. Additional scattered small ground-glass nodules are  stable.   Electronically Signed  By: Sherryl Barters M.D.  On: 05/25/2014 11:53  Impression: Melissa Snyder is a 80 year old woman with multiple groundglass opacities in her lungs bilaterally. Largest of these is in the superior segment of the left lower lobe. This was faintly hypermetabolic by PET, the SUV was 1.1.  She also had a hypermetabolic subcarinal node that was not particularly enlarged. Given her multitude of recent health issues and the lack of a truly dominant solitary nodule we elected to follow her radiographically.  There has been no interval change of the nodules or subcarinal node.  Given her recent diagnosis of breast cancer and plans to treat that, at this point I would recommend continued radiographic follow-up of her lung nodules. I do think that we should repeat her PET/CT to see if there is any change in regards to the metabolic activity in the subcarinal node.  It is possible that these nodules represent adenocarcinoma in situ or with lepidic spread, previously termed multifocal bronchoalveolar cell carcinoma. This can often have a very benign course if left alone.  Plan:  Return in 3 months with PET/CT

## 2014-06-01 ENCOUNTER — Other Ambulatory Visit (INDEPENDENT_AMBULATORY_CARE_PROVIDER_SITE_OTHER): Payer: Self-pay

## 2014-06-01 DIAGNOSIS — C50911 Malignant neoplasm of unspecified site of right female breast: Secondary | ICD-10-CM

## 2014-06-02 ENCOUNTER — Other Ambulatory Visit: Payer: Self-pay | Admitting: Internal Medicine

## 2014-06-02 ENCOUNTER — Telehealth: Payer: Self-pay | Admitting: *Deleted

## 2014-06-02 ENCOUNTER — Ambulatory Visit
Admission: RE | Admit: 2014-06-02 | Discharge: 2014-06-02 | Disposition: A | Discharge status: Home / Self Care | Payer: Medicare HMO | Source: Ambulatory Visit | Attending: Internal Medicine | Admitting: Internal Medicine

## 2014-06-02 DIAGNOSIS — R51 Headache: Principal | ICD-10-CM

## 2014-06-02 DIAGNOSIS — R519 Headache, unspecified: Secondary | ICD-10-CM

## 2014-06-02 MED ORDER — GADOBENATE DIMEGLUMINE 529 MG/ML IV SOLN
13.0000 mL | Freq: Once | INTRAVENOUS | Status: AC | PRN
  Administered 2014-06-02: 13 mL via INTRAVENOUS

## 2014-06-02 NOTE — Telephone Encounter (Signed)
Received request from Roma Kayser to call pt and schedule an appt.  Called pt and confirmed 06/07/14 genetic appt w/ her.  Emailed Santiago Glad and Ammie to make them aware.

## 2014-06-07 ENCOUNTER — Encounter: Payer: Self-pay | Admitting: Genetic Counselor

## 2014-06-07 ENCOUNTER — Ambulatory Visit (HOSPITAL_BASED_OUTPATIENT_CLINIC_OR_DEPARTMENT_OTHER): Payer: Medicare HMO | Admitting: Genetic Counselor

## 2014-06-07 ENCOUNTER — Other Ambulatory Visit: Payer: Medicare HMO

## 2014-06-07 DIAGNOSIS — Z8052 Family history of malignant neoplasm of bladder: Secondary | ICD-10-CM

## 2014-06-07 DIAGNOSIS — Z315 Encounter for genetic counseling: Secondary | ICD-10-CM

## 2014-06-07 DIAGNOSIS — Z8042 Family history of malignant neoplasm of prostate: Secondary | ICD-10-CM

## 2014-06-07 DIAGNOSIS — Z853 Personal history of malignant neoplasm of breast: Secondary | ICD-10-CM

## 2014-06-07 DIAGNOSIS — Z803 Family history of malignant neoplasm of breast: Secondary | ICD-10-CM

## 2014-06-07 NOTE — Progress Notes (Signed)
REFERRING PROVIDER: Jerlyn Ly, MD [address] [place], [place] [address]  PRIMARY PROVIDER:  Jerlyn Ly, MD  PRIMARY REASON FOR VISIT:  1. Personal history of breast cancer   2. Family history of breast cancer   3. Family history of bladder cancer   4. Family history of prostate cancer      HISTORY OF PRESENT ILLNESS:   Melissa Snyder, a 81 y.o. female, was seen for a [facility] cancer genetics consultation at the request of Dr. Joylene Draft due to a personal and family history of cancer.  Melissa Snyder presents to clinic today to discuss the possibility of a hereditary predisposition to cancer, genetic testing, and to further clarify her future cancer risks, as well as potential cancer risks for family members.   CANCER HISTORY:   No history exists.     HISTORY OF PRESENT ILLNESS: In August 2015, at the age of 70, Melissa Snyder was diagnosed with melanoma on her face.  This was treated with surgical excision.  In December 2015, at the age of 53, Melissa Snyder was diagnosed with invasive ductal carcinoma of the breast.  The tumor is ER+/PR+/Her2-.  She is seeing Dr. Lucia Gaskins for surgery and has an appointment with Dr. Pablo Ledger on 06/10/2014.  She does not have an oncologist at this time.  She is unaware whether her daughter had genetic testing when she was diagnosed with breast cancer.   HORMONAL RISK FACTORS:  Menarche was at age 52.  First live birth at age 45.  OCP use for approximately 10 years.  Ovaries intact: no.  Hysterectomy: yes.  Menopausal status: postmenopausal.  HRT use: 0 years. Colonoscopy: yes; has 1-2 lifetime polyps.. Mammogram within the last year: yes. Number of breast biopsies: 1. Up to date with pelvic exams:  Has not needed a pelvic exam since her hysterectomy at age 67 (49 years). Any excessive radiation exposure in the past:  Had Gamma Knife surgery on a meningioma.  Past Medical History  Diagnosis Date  . Chest pain   . Bundle branch block left   . Migraine   .  Hypothyroid   . Hypertension   . Arthritis     OA  . Hyperlipidemia   . GERD (gastroesophageal reflux disease)   . Melanoma in situ of face JUNE 2015    LEFT CHEEK  . Breast cancer 05/2014    ER+/PR+/Her2-  . Family history of breast cancer   . Family history of bladder cancer   . Family history of prostate cancer     Past Surgical History  Procedure Laterality Date  . Cardiac catheterization    . Dilation and curettage of uterus      X 2  . Abdominal hysterectomy    . Bilateral salpingoophorectomy    . Bladder repair    . Cholecystectomy    . Melanoma excision Left 11/05/23, 11/30/13    CHEEK    History   Social History  . Marital Status: Married    Spouse Name: N/A    Number of Children: 4  . Years of Education: N/A   Social History Main Topics  . Smoking status: Never Smoker   . Smokeless tobacco: Never Used  . Alcohol Use: No  . Drug Use: No  . Sexual Activity: None   Other Topics Concern  . None   Social History Narrative     FAMILY HISTORY:  We obtained a detailed, 4-generation family history.  Significant diagnoses are listed below: Family History  Problem Relation Age of Onset  . CAD Mother     CABG  . Hyperlipidemia Mother   . AAA (abdominal aortic aneurysm) Mother   . Renal Disease Father   . Kidney disease Father   . CAD Father   . Cancer Father     PROSTATE  . Diabetes Brother   . Hyperlipidemia Brother   . Breast cancer Daughter 58  . Breast cancer Sister 44  . Breast cancer Sister 73  . Cancer Brother     NOS  . Bladder Cancer Brother 80  . Breast cancer Cousin     five paternal first cousins  . Lung cancer Cousin     3 paternal cousins with lung cancer  . Cancer Cousin     4 paternal first cousins with Cancer NOS  . Cancer Cousin     1 paternal cousin with oral cancer (tongue)   The patient has three brothers and three sisters.  Two sisters had breast cancer, one brother had bladder cancer and one brother had cancer in his leg  of unknown eitology.  Her mother had 56 brothers and sisters and her father had 13 brothers and sisters. She is unaware of cancer on her mother's side of the family, and does not know whether her paternal aunts and uncles had cancer, but knows that multiple paternal cousins have had cancer. Patient's maternal ancestors are of Caucasian descent, and paternal ancestors are of [place] descent. There is no reported Ashkenazi Jewish ancestry. There is no known consanguinity.  GENETIC COUNSELING ASSESSMENT: FEIGA NADEL is a 81 y.o. female with a personal and family history of cancer which somewhat suggestive of a hereditary cancer syndrome and predisposition to cancer. We, therefore, discussed and recommended the following at today's visit.   DISCUSSION: We reviewed the characteristics, features and inheritance patterns of hereditary cancer syndromes. We reviewed hereditary cancer syndromes including BRCA, but multiple others based on many individuals with unknown cancers.  We also discussed genetic testing, including the appropriate family members to test, the process of testing, insurance coverage and turn-around-time for results. We discussed the implications of a negative, positive and/or variant of uncertain significant result. In order to get genetic test results in a timely manner so that Ms. Taher can use these genetic test results for surgical decisions, we recommended Melissa Snyder pursue genetic testing for the BRCAPlus. If this test is negative, we then recommend Melissa Snyder pursue reflex genetic testing to the CancerNext gene panel.   PLAN: After considering the risks, benefits, and limitations,Melissa Snyder  provided informed consent to pursue genetic testing and the blood sample was sent to [facility] for analysis of the BRCAPlus, reflexing to CancerNext if BRCAPlus is negative. Results should be available within approximately ~2 weeks' time, at which point they will be disclosed by telephone  to Ms. Breuer, as will any additional recommendations warranted by these results. Melissa Snyder will receive a summary of her genetic counseling visit and a copy of her results once available. CancerNext testing will be performed and will take an additional 3-4 weeks after the BRCAPlus testing is completed.  This information will also be available in Epic. We encouraged Ms. Mongiello to remain in contact with cancer genetics annually so that we can continuously update the family history and inform her of any changes in cancer genetics and testing that may be of benefit for her family. Ms. Bossi questions were answered to her satisfaction today. Our contact information was provided should additional  questions or concerns arise.  Lastly, we encouraged Ms. Weichel to remain in contact with cancer genetics annually so that we can continuously update the family history and inform her of any changes in cancer genetics and testing that may be of benefit for this family.   Ms.  Neubert questions were answered to her satisfaction today. Our contact information was provided should additional questions or concerns arise. Thank you for the referral and allowing [place] to share in the care of your patient.   Sophi Calligan P. Florene Glen, [place], [facility] Certified Genetic Counselor Santiago [email or web address] phone: [phone]  The patient was seen for a total of 60 minutes in face-to-face genetic counseling.  This patient was discussed with Drs. Magrinat, Lindi Adie and/or Burr Medico who agrees with the above.    _______________________________________________________________________ For Office Staff:  Number of people involved in session: 1 Was an Intern/ student involved with case: no

## 2014-06-09 ENCOUNTER — Telehealth: Payer: Self-pay | Admitting: *Deleted

## 2014-06-09 NOTE — Telephone Encounter (Signed)
Received referral from [place].  Called pt and confirmed 06/10/14 appt w/ her.  Unable to mail before appt letter - gave verbal.  Unable to mail welcoming packet - gave directions and instructions.  Unable to mail intake form - placed a note for one to be given at time of check in.  Emailed Engineer, civil (consulting) at [employer] to make her aware.  Added to spreadsheet. All records are in [facility].

## 2014-06-09 NOTE — Progress Notes (Signed)
Thoracic Location of Tumor / Histology:Multiple lung nodules with the greatest of left lower lobe lung measuring  13 to 16 mm with mediastinal adenopathy.  Biopsies of Breast biopsy 05/19/14  Breast, right, needle core biopsy, UOQ - INVASIVE DUCTAL CARCINOMA. ER+PR+  Tobacco/Marijuana/Snuff/ETOH use: No  Past/Anticipated interventions by cardiothoracic surgery;recommends follow up for lung nodules since patient diagnosed with breast cancer.  Signs/Symptoms  Weight changes, if any: stable  Respiratory complaints, if any: None  Hemoptysis, if any:no  Pain issues, if any:No  SAFETY ISSUES:  Prior radiation? Gamma knife at [facility] August 2015 with Dr.Tanner  Pacemaker/ICD? No  Possible current pregnancy?No  Is the patient on methotrexate? No  Current Complaints / other details:Married, Menarche 71 First live birth age 3 4 children naturally Hormonal replacement therapy (estrogen) 4 or 5 years  Blind in left eye, newly diagnosed glaucoma, scheduled for laser surgery this month. Patient has stable left orbit meningioma as revealed on brain mri 06/02/14. Newly diagnosed grade II invasive ductal carcinoma of right breast.

## 2014-06-10 ENCOUNTER — Ambulatory Visit
Admission: RE | Admit: 2014-06-10 | Discharge: 2014-06-10 | Disposition: A | Discharge status: Home / Self Care | Payer: Medicare HMO | Source: Ambulatory Visit | Attending: Radiation Oncology | Admitting: Radiation Oncology

## 2014-06-10 ENCOUNTER — Telehealth: Payer: Self-pay | Admitting: Hematology

## 2014-06-10 ENCOUNTER — Encounter: Payer: Self-pay | Admitting: Radiation Oncology

## 2014-06-10 ENCOUNTER — Ambulatory Visit: Payer: Medicare HMO

## 2014-06-10 ENCOUNTER — Ambulatory Visit (HOSPITAL_BASED_OUTPATIENT_CLINIC_OR_DEPARTMENT_OTHER): Payer: Medicare HMO | Admitting: Hematology

## 2014-06-10 ENCOUNTER — Encounter: Payer: Self-pay | Admitting: Hematology

## 2014-06-10 VITALS — BP 144/69 | HR 68 | Temp 97.7°F | Resp 12 | Wt 144.7 lb

## 2014-06-10 VITALS — BP 159/64 | HR 78 | Temp 98.5°F | Resp 18 | Ht 66.0 in | Wt 142.2 lb

## 2014-06-10 DIAGNOSIS — C50412 Malignant neoplasm of upper-outer quadrant of left female breast: Secondary | ICD-10-CM

## 2014-06-10 DIAGNOSIS — Z8673 Personal history of transient ischemic attack (TIA), and cerebral infarction without residual deficits: Secondary | ICD-10-CM

## 2014-06-10 DIAGNOSIS — H5442 Blindness, left eye, normal vision right eye: Secondary | ICD-10-CM

## 2014-06-10 DIAGNOSIS — C50911 Malignant neoplasm of unspecified site of right female breast: Secondary | ICD-10-CM

## 2014-06-10 DIAGNOSIS — I10 Essential (primary) hypertension: Secondary | ICD-10-CM

## 2014-06-10 DIAGNOSIS — C50411 Malignant neoplasm of upper-outer quadrant of right female breast: Secondary | ICD-10-CM

## 2014-06-10 DIAGNOSIS — R911 Solitary pulmonary nodule: Secondary | ICD-10-CM

## 2014-06-10 NOTE — Progress Notes (Signed)
Please see the Nurse Progress Note in the MD Initial Consult Encounter for this patient. 

## 2014-06-10 NOTE — Progress Notes (Signed)
[facility]  Telephone:([phone] Fax:([phone]) [phone]  Clinic New Consult Note   Patient Care Team: Jerlyn Ly, MD as PCP - General (Internal Medicine) [name], MD as Consulting Physician (Cardiothoracic Surgery) Reece Packer, MD as Consulting Physician (Urology) 06/10/2014  CHIEF COMPLAINTS/PURPOSE OF CONSULTATION:  Newly diagnosed breast caner   HISTORY OF PRESENTING ILLNESS:  Melissa Snyder 81 y.o. female is here because of newly diagnosed breast cancer. She was referred by Dr. Lucia Gaskins. She came in today by herself.   This was found on screening mammogram on 05/03/2014, and the diagnosed mammogram and US showed a 2m lesion at 10:00 of right breast. She underwent biopsy and it showed IDA, ER+/PR+/HER2-.   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 She was also found multiple small LLL lung nodules during that time, which is followed by pulmonologist Dr. HRoxan Hockey   She feels well in general, left eye blindness has not changed. No pain, cough, abdominal discomfort or other complains. No weight loss.   MEDICAL HISTORY:  Past Medical History  Diagnosis Date  . Chest pain   . Bundle branch block left   . Migraine   . Hypothyroid   . Hypertension   . Arthritis     OA  . Hyperlipidemia   . GERD (gastroesophageal reflux disease)   . Melanoma in situ of face JUNE 2015    LEFT CHEEK  . Breast cancer 05/2014    ER+/PR+/Her2-  . Family history of breast cancer   . Family history of bladder cancer   . Family history of prostate cancer   stroke in May 2015, with residual left vision loss.   SURGICAL HISTORY: Past Surgical History  Procedure Laterality Date  . Cardiac catheterization    . Dilation and curettage of uterus      X 2  . Abdominal hysterectomy    . Bilateral salpingoophorectomy    . Bladder repair    . Cholecystectomy    . Melanoma excision Left 11/05/23, 11/30/13    CHEEK  .  Bunionectomy      bilateral great toe    SOCIAL HISTORY: History   Social History  . Marital Status: Married    Spouse Name: N/A    Number of Children: 4  . Years of Education: N/A   Occupational History  . Not on file.   Social History Main Topics  . Smoking status: Never Smoker   . Smokeless tobacco: Never Used  . Alcohol Use: No  . Drug Use: No  . Sexual Activity: Not on file   Other Topics Concern  . Not on file   Social History Narrative    FAMILY HISTORY: Family History  Problem Relation Age of Onset  . CAD Mother     CABG  . Hyperlipidemia Mother   . AAA (abdominal aortic aneurysm) Mother   . Renal Disease Father   . Kidney disease Father   . CAD Father   . Cancer Father     PROSTATE  . Diabetes Brother   . Hyperlipidemia Brother   . Breast cancer Daughter 512 . Breast cancer Sister 748 . Breast cancer Sister 776 . Cancer Brother     NOS  . Bladder Cancer Brother 815 . Breast cancer Cousin     five paternal first cousins  . Lung cancer Cousin     3 paternal cousins with lung cancer  . Cancer  Cousin     4 paternal first cousins with Cancer NOS  . Cancer Cousin     1 paternal cousin with oral cancer (tongue)   GYN HISTORY  Menarchal: 50 LMP: 39 (hsrectomy)  Contraceptive: a few years  HRT: 4 years.  G4P4:   ALLERGIES:  is allergic to tramadol; ibuprofen; and niacin and related.  MEDICATIONS:  Current Outpatient Prescriptions  Medication Sig Dispense Refill  . aspirin 81 MG chewable tablet Chew 1 tablet (81 mg total) by mouth daily. 30 tablet 0  . atenolol (TENORMIN) 50 MG tablet Take 50 mg by mouth at bedtime.     Marland Kitchen b complex vitamins capsule Take 1 capsule by mouth daily.    . calcium-vitamin D (OSCAL WITH D) 500-200 MG-UNIT per tablet Take 1 tablet by mouth every morning.    . Digestive Enzymes (DIGESTIVE ENZYME PO) Take 1 capsule by mouth as needed.    . fish oil-omega-3 fatty acids 1000 MG capsule Take 1 g by mouth every morning.      Marland Kitchen levothyroxine (SYNTHROID, LEVOTHROID) 112 MCG tablet Take 112 mcg by mouth every morning.     . Multiple Vitamin (MULTIVITAMIN) tablet Take 1 tablet by mouth every morning.     . Multiple Vitamins-Minerals (VISION FORMULA PO) Take 1 capsule by mouth daily.    . vitamin E (E-400) 400 UNIT capsule Take 400 Units by mouth daily.     No current facility-administered medications for this visit.    REVIEW OF SYSTEMS:   Constitutional: Denies fevers, chills or abnormal night sweats Eyes: Denies blurriness of vision, double vision or watery eyes Ears, nose, mouth, throat, and face: Denies mucositis or sore throat Respiratory: Denies cough, dyspnea or wheezes Cardiovascular: Denies palpitation, chest discomfort or lower extremity swelling Gastrointestinal:  Denies nausea, heartburn or change in bowel habits Skin: Denies abnormal skin rashes Lymphatics: Denies new lymphadenopathy or easy bruising Neurological:Denies numbness, tingling or new weaknesses Behavioral/Psych: Mood is stable, no new changes  All other systems were reviewed with the patient and are negative.  PHYSICAL EXAMINATION: ECOG PERFORMANCE STATUS: 0 - Asymptomatic  Filed Vitals:   06/10/14 1435  BP: 159/64  Pulse: 78  Temp: 98.5 F (36.9 C)  Resp: 18   Filed Weights   06/10/14 1435  Weight: 142 lb 3.2 oz (64.501 kg)    GENERAL:alert, no distress and comfortable SKIN: skin color, texture, turgor are normal, no rashes or significant lesions EYES: normal, conjunctiva are pink and non-injected, sclera clear OROPHARYNX:no exudate, no erythema and lips, buccal mucosa, and tongue normal  NECK: supple, thyroid normal size, non-tender, without nodularity LYMPH:  no palpable lymphadenopathy in the cervical, axillary or inguinal LUNGS: clear to auscultation and percussion with normal breathing effort HEART: regular rate & rhythm and no murmurs and no lower extremity edema ABDOMEN:abdomen soft, non-tender and normal bowel  sounds Musculoskeletal:no cyanosis of digits and no clubbing  PSYCH: alert & oriented x 3 with fluent speech NEURO: no focal motor/sensory deficits Breasts: Breast inspection showed them to be symmetrical with no nipple discharge. Palpation of the breasts and axilla revealed no obvious mass that I could appreciate.   LABORATORY DATA:  I have reviewed the data as listed Lab Results  Component Value Date   WBC 6.1 10/24/2013   HGB 11.9* 10/24/2013   HCT 35.6* 10/24/2013   MCV 86.0 10/24/2013   PLT 156 10/24/2013    Recent Labs  10/24/13 1324 10/24/13 2108  NA 140  --   K 4.3  --  CL 101  --   CO2 27  --   GLUCOSE 90  --   BUN 16  --   CREATININE 0.66 0.84  CALCIUM 9.5  --   GFRNONAA 82* 65*  GFRAA >90 75*   PAHTOLOGY REPORT 05/19/2014 Diagnosis Breast, right, needle core biopsy, UOQ - INVASIVE DUCTAL CARCINOMA.  Microscopic Comment The findings are consistent with grade II invasive ductal carcinoma.  Estrogen Receptor: 100%, POSITIVE, STRONG STAINING INTENSITY Progesterone Receptor: 35%, POSITIVE, MODERATE STAINING INTENSITY Proliferation Marker Ki67: 8% Results: HER-2/NEU BY CISH - NO AMPLIFICATION OF HER-2 DETECTED. RESULT RATIO OF HER2: CEP 17 SIGNALS 1.06 AVERAGE HER2 COPY NUMBER PER CELL 1.80  RADIOGRAPHIC STUDIES: I have personally reviewed the radiological images as listed and agreed with the findings in the report. Ct Chest Wo Contrast 05/25/2014    IMPRESSION:  1. Persistence and similar size of the dominant left lower lobe sub solid nodule. Adenocarcinoma is considered. Biopsy or resection should be strongly considered. These recommendations are taken from:Recommendations for the Management of Subsolid Pulmonary Nodules Detected at CT: A Statement from the [place], Radiology 2013; 266:1, 304-317.  2. Additional scattered small ground-glass nodules are stable.    Electronically Signed   By: Sherryl Barters M.D.   On: 05/25/2014 11:53   Mr  Jeri Cos ZO Contrast 06/02/2014    IMPRESSION: 1. Grossly stable appearance of the left planum sphenoidale meningioma measuring 20 x 14 mm on the axial images. 2. The meningioma does extend to the left orbital apex and contacts the left optic nerve. This could be related to the vision loss. 3. Stable posterior meningioma along the scratch that is stable smaller meningioma along the right posterior clinoid. 4. Extensive periventricular and subcortical white matter changes bilaterally are stable. These likely reflect the sequela of chronic microvascular ischemia. 5. Chronic right maxillary sinus disease. 6. Fluid level in the left sphenoid sinus, compatible with acute disease.   Electronically Signed   By: Lawrence Santiago M.D.   On: 06/02/2014 15:57   Mm Digital Diagnostic Unilat R  05/19/2014   CLINICAL DATA:  Post right breast ultrasound-guided core biopsy.  EXAM: DIAGNOSTIC right breast MAMMOGRAM POST ultrasound BIOPSY  COMPARISON:  Previous exams  FINDINGS: Mammographic images were obtained following ultrasound guided biopsy of the mass located within the right breast at 10 o'clock position 7 cm from the nipple. The heart shaped clip is in appropriate position.  IMPRESSION: Appropriate position of clip following right breast ultrasound-guided core biopsy.  Final Assessment: Post Procedure Mammograms for Marker Placement   Electronically Signed   By: Luberta Robertson M.D.   On: 05/19/2014 14:22   Mm Digital Diagnostic Unilat R  05/19/2014   CLINICAL DATA:  Recall from screening mammogram.  EXAM: DIGITAL DIAGNOSTIC  right breast MAMMOGRAM  ULTRASOUND right BREAST  COMPARISON:  Previous examinations the most recent of which is dated 04/28/2014.  ACR Breast Density Category b: There are scattered areas of fibroglandular density.  FINDINGS: Spot compression views the right breast demonstrate a small irregularly marginated mass to be present within the upper outer quadrant of the right breast at approximately the  10 o'clock position 7 cm from the nipple.  On physical exam, there is no discrete palpable abnormality within the upper outer quadrant of the right breast.  Ultrasound is performed, showing an irregularly marginated, vertically oriented, hypoechoic mass located within the right breast at 10 o'clock position 7 cm from the nipple with shadowing. This measures 7 x 6 x 5  mm in size. This is suspicious for invasive mammary carcinoma and tissue sampling via ultrasound-guided core biopsy is recommended. This will be performed and reported separately.  Ultrasound of the right axilla demonstrates normal axillary contents and no evidence for adenopathy.  IMPRESSION: 7 mm irregular suspicious mass located within the right breast at the 10 o'clock position 7 cm from the nipple. Tissue sampling is recommended and ultrasound-guided core biopsy will be performed and reported separately.  RECOMMENDATION: Right breast ultrasound-guided core biopsy.  I have discussed the findings and recommendations with the patient. Results were also provided in writing at the conclusion of the visit. If applicable, a reminder letter will be sent to the patient regarding the next appointment.  BI-RADS CATEGORY  4: Suspicious.   Electronically Signed   By: Luberta Robertson M.D.   On: 05/19/2014 11:58   US Breast Ltd Uni Right Inc Axilla  05/19/2014      IMPRESSION: 7 mm irregular suspicious mass located within the right breast at the 10 o'clock position 7 cm from the nipple. Tissue sampling is recommended and ultrasound-guided core biopsy will be performed and reported separately.  RECOMMENDATION: Right breast ultrasound-guided core biopsy.  I have discussed the findings and recommendations with the patient. Results were also provided in writing at the conclusion of the visit. If applicable, a reminder letter will be sent to the patient regarding the next appointment.  BI-RADS CATEGORY  4: Suspicious.   Electronically Signed   By: Luberta Robertson  M.D.   On: 05/19/2014 11:58   [place] Rt Breast Bx W Loc Dev 1st Lesion Img Bx Spec US Guide  05/21/2014   ADDENDUM REPORT: 05/21/2014 07:42  ADDENDUM: Pathology revealed grade II invasive ductal carcinoma in the right breast. This was found to be concordant by Dr. Hassan Rowan. Pathology was discussed with the patient by telephone. She reported doing well after the biopsy. Post biopsy instructions and care were reviewed and her questions were answered. Surgical consultation has been arranged with Dr. Alphonsa Overall at [facility] on May 31, 2014. She was encouraged to come to [facility] for educational materials. My number was provided for future questions or concerns.  Pathology results reported by Susa Raring RN, BSN on May 21, 2014.   Electronically Signed   By: Luberta Robertson M.D.   On: 05/21/2014 07:42   05/21/2014   CLINICAL DATA:  Right breast mass.  EXAM: ULTRASOUND GUIDED right BREAST CORE NEEDLE BIOPSY  COMPARISON:  Previous exams.  FINDINGS: I met with the patient and we discussed the procedure of ultrasound-guided biopsy, including benefits and alternatives. We discussed the high likelihood of a successful procedure. We discussed the risks of the procedure, including infection, bleeding, tissue injury, clip migration, and inadequate sampling. Informed written consent was given. The usual time-out protocol was performed immediately prior to the procedure.  Using sterile technique and 2% Lidocaine as local anesthetic, under direct ultrasound visualization, a 14 gauge spring-loaded device was used to perform biopsy of the mass located within the right breast at 10 o'clock position 7 cm from the nipple using a lateral approach. At the conclusion of the procedure a heart shaped tissue marker clip was deployed into the biopsy cavity. Follow up 2 view mammogram was performed and dictated separately.  IMPRESSION: Ultrasound guided biopsy of the mass  located within the right breast at 10 o'clock position as discussed above. No apparent complications.  Electronically Signed: By: Luberta Robertson M.D. 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  1. Right breast invasive ductal carcinoma, see T1b N0 M0 stage IA, ER positive PR positive and HER-2 negative. -She has seen a surgeon Dr. NLucia Gaskinsand we'll have our surgical resection for her breast cancer. -Due to her family history of breast cancer in multiple family members, and multiple personal history of cancer, she has been evaluated by our genetic counselor. Her genetic testing for inherited breast cancer is pending. -Given the positive hormone receptors, I recommend adjuvant endocrine therapy with one of the aromatase inhibitor, to reduce her risks of cancer recurrence in the future. -Given the early stage of breast cancer, she did not need adjuvant chemotherapy. I briefly discussed the role of Oncotype DX test to predict her cancer recurrence risk. I would like to see her after her breast surgery and decide on Oncotype DX test based on her surgical pathology results result. -She is going to see Dr. weight was today to discuss adjuvant radiation.  2. Pulmonary nodules -I have reviewed her CT chest. The left lower lobe lung nodule is about 8 mm, is a little concerning for low-grade malignancy. She has never smoked, the overall risks of lung cancer is low.  -She is scheduled for repeated scan  in March 2016. She has following with pulmonologist Dr. HRoxan Hockeyfor the lung nodules.  3. She'll continue follow-up with her primary care physician and other specialists for her other medical issues.  Follow-up: I plan to see her back in 2-3 weeks after her surgery.  All questions were answered. The patient knows to call the clinic with any problems, questions or concerns. I spent 40 minutes counseling the patient face to face. The total time spent in the appointment was 60 minutes and more than 50% was on counseling.     FTruitt Merle MD 06/10/2014 3:07 PM

## 2014-06-10 NOTE — Progress Notes (Signed)
Checked in new pt with no financial concerns at this time.  Informed pt if chemo is part of her treatment Melissa Snyder will call her ins to see if Josem Kaufmann is needed and will obtain that if it is as well as contact foundations that offer copay assistance for chemo if needed.  She has Melissa Snyder's card for any questions or concerns.

## 2014-06-10 NOTE — Telephone Encounter (Signed)
Gave avs & cal for Feb. °

## 2014-06-11 NOTE — Progress Notes (Signed)
Radiation Oncology         [phone]) [phone] ________________________________  Initial outpatient Consultation - Date: 06/10/2014   Name: Melissa Snyder MRN: [id number]   DOB: [birth year]  REFERRING PHYSICIAN: Jerlyn Ly, MD  DIAGNOSIS:    ICD-9-CM ICD-10-CM   1. Malignant neoplasm of upper outer quadrant of female breast, left 174.4 C50.412    HISTORY OF PRESENT ILLNESS::Melissa Snyder is a 81 y.o. female  Who was found to have a lesion on a recent screening mammogram in November. Ultrasound confirmed a 7 mm lesion in the right breast. Biopsy showed an invasive ductal carcinoma which was ER/PR+ HER2-. She is followed for likely multifocal BAC by Dr. Roxan Hockey. She is in good health. She has no history of breast cancer or prior cancer treatment other than SRS for a meningioma last year. She is GxP4 with her first child at 86. She is married and had menarch at 71. She used HRt for 3- years but quit many years ago. She was referred for my opnion regarding radiation in the management of her disease  PREVIOUS RADIATION THERAPY: Yes- Gamma Knife to her meningioma.   FAMILY HISTORY:  Family History  Problem Relation Age of Onset  . CAD Mother     CABG  . Hyperlipidemia Mother   . AAA (abdominal aortic aneurysm) Mother   . Renal Disease Father   . Kidney disease Father   . CAD Father   . Cancer Father     PROSTATE  . Diabetes Brother   . Hyperlipidemia Brother   . Breast cancer Daughter 20  . Breast cancer Sister 68  . Breast cancer Sister 33  . Cancer Brother     NOS  . Bladder Cancer Brother 68  . Breast cancer Cousin     five paternal first cousins  . Lung cancer Cousin     3 paternal cousins with lung cancer  . Cancer Cousin     4 paternal first cousins with Cancer NOS  . Cancer Cousin     1 paternal cousin with oral cancer (tongue)    SOCIAL HISTORY:  History  Substance Use Topics  . Smoking status: Never Smoker   . Smokeless tobacco: Never Used  . Alcohol Use: No     REVIEW OF SYSTEMS:  A 15 point review of systems is documented in the electronic medical record. This was obtained by the nursing staff. However, I reviewed this with the patient to discuss relevant findings and make appropriate changes.  Pertinent positives are included in the chart.   PHYSICAL EXAM:  Filed Vitals:   06/10/14 0958  BP: 144/69  Pulse: 68  Temp: 97.7 F (36.5 C)  Resp: 12  .144 lb 11.2 oz (65.635 kg). Pleasant female Appears her stated age. Alert and oriented. No palpable supraclavicular, axillary or cervical adenopathy. No palpable abnormalities of the right or left breast. Small healing incision where biopsy site was in upper outer quadrant of the right breast  IMPRESSION: T1bN0 invasive ductal carcnoma of the right breast  PLAN: I spoke to the patient today regarding her diagnosis and options for treatment. We discussed the equivalence in terms of survival and local failure between mastectomy and breast conservation. We discussed the role of radiation in decreasing local failures in patients who undergo lumpectomy. We discussed the process of simulation and the placement tattoos. We discussed 3 and a half weeks of treatment as an outpatient. We discussed the possibility of asymptomatic lung damage. We discussed the  low likelihood of secondary malignancies. We discussed the possible side effects including but not limited to skin redness, fatigue, permanent skin darkening, and breast swelling.    We discussed the results of elderly trials looking at the omission of radiation and adjuvant antiestrogen therapy alone.  We discussed that she may not be a candidate for antiestrogen therapy given her history of blood clots to her eye. She would prefer not to be on another medicine if possible. We discussed that she would likely gain adequate local control with surgery and radiation. She will meet with medical oncology tomorrow. I encouraged her to let Dr. Chrissie Noa know when she had  made her surgical decision.  I spent 40 minutes  face to face with the patient and more than 50% of that time was spent in counseling and/or coordination of care.   ------------------------------------------------  [name], MD

## 2014-06-12 ENCOUNTER — Encounter: Payer: Self-pay | Admitting: Hematology

## 2014-07-01 ENCOUNTER — Encounter: Payer: Self-pay | Admitting: Genetic Counselor

## 2014-07-01 ENCOUNTER — Telehealth: Payer: Self-pay | Admitting: Genetic Counselor

## 2014-07-01 DIAGNOSIS — Z1379 Encounter for other screening for genetic and chromosomal anomalies: Secondary | ICD-10-CM | POA: Insufficient documentation

## 2014-07-01 NOTE — Telephone Encounter (Signed)
LVM message on both home and cell with good news on initial portion of test.

## 2014-07-06 ENCOUNTER — Telehealth: Payer: Self-pay | Admitting: Genetic Counselor

## 2014-07-06 NOTE — Telephone Encounter (Signed)
Revealed negative genetic test results.  [facility] testing is pending.

## 2014-07-14 ENCOUNTER — Other Ambulatory Visit: Payer: Self-pay | Admitting: *Deleted

## 2014-07-14 ENCOUNTER — Other Ambulatory Visit (INDEPENDENT_AMBULATORY_CARE_PROVIDER_SITE_OTHER): Payer: Self-pay | Admitting: Surgery

## 2014-07-14 DIAGNOSIS — R918 Other nonspecific abnormal finding of lung field: Secondary | ICD-10-CM

## 2014-07-14 DIAGNOSIS — C50911 Malignant neoplasm of unspecified site of right female breast: Secondary | ICD-10-CM

## 2014-07-15 ENCOUNTER — Other Ambulatory Visit (INDEPENDENT_AMBULATORY_CARE_PROVIDER_SITE_OTHER): Payer: Self-pay | Admitting: Surgery

## 2014-07-15 DIAGNOSIS — C50911 Malignant neoplasm of unspecified site of right female breast: Secondary | ICD-10-CM

## 2014-07-16 ENCOUNTER — Encounter: Payer: Self-pay | Admitting: Genetic Counselor

## 2014-07-16 ENCOUNTER — Telehealth: Payer: Self-pay | Admitting: Genetic Counselor

## 2014-07-16 ENCOUNTER — Encounter (HOSPITAL_BASED_OUTPATIENT_CLINIC_OR_DEPARTMENT_OTHER): Payer: Self-pay | Admitting: *Deleted

## 2014-07-16 NOTE — Progress Notes (Signed)
HPI: Melissa Snyder was previously seen in the [place] clinic due to a personal and family history of cancer and concerns regarding a hereditary predisposition to cancer. Please refer to our prior cancer genetics clinic note for more information regarding Melissa Snyder's medical, social and family histories, and our assessment and recommendations, at the time. Melissa Snyder recent genetic test results were disclosed to her, as were recommendations warranted by these results. These results and recommendations are discussed in more detail below.  GENETIC TEST RESULTS: At the time of Melissa Snyder's visit, we recommended she pursue genetic testing of the BRCAPlus and CancerNext gene panel. All genes on the BRCAPlus gene test are also on the CancerNext gene panel, but it was ordered sequentially for medical management purposes.  The CancerNext gene panel offered by [employer] and includes sequencing and rearrangement analysis for the following 32 genes:   APC, ATM, BARD1, BMPR1A, BRCA1, BRCA2, BRIP1, CDH1, CDK4, CDKN2A, CHEK2, EPCAM, GREM1, MLH1, MRE11A, MSH2, MSH6, MUTYH, NBN, NF1, PALB2, PMS2, POLD1, POLE, PTEN, RAD50, RAD51D, SMAD4, SMARCA4, STK11, and TP53.  Testing was performed at [facility]. Genetic testing was normal, and did not reveal a deleterious mutation in these genes. The test report has been scanned into EPIC and is located under the Media tab.   We discussed with Melissa Snyder that since the current genetic testing is not perfect, it is possible there may be a gene mutation in one of these genes that current testing cannot detect, but that chance is small. We also discussed, that it is possible that another gene that has not yet been discovered, or that we have not yet tested, is responsible for the cancer diagnoses in the family, and it is, therefore, important to remain in touch with cancer genetics in the future so that we can continue to offer Melissa Snyder the most up to  date genetic testing.   CANCER SCREENING RECOMMENDATIONS: This result is reassuring and suggests that Melissa Snyder's personal and family history of cancer was most likely not due to an inherited predisposition associated with one of these genes. Most cancers happen by chance and this negative test, along with details of her family history, suggests that her cancer falls into this category. We, therefore, recommended she continue to follow the cancer management and screening guidelines provided by her oncology and primary providers.   RECOMMENDATIONS FOR FAMILY MEMBERS: Women in this family might be at some increased risk of developing cancer, over the general population risk, simply due to the family history of cancer. We recommended women in this family have a yearly mammogram beginning at age 22, or 48 years younger than the earliest onset of cancer, an an annual clinical breast exam, and perform monthly breast self-exams. Women in this family should also have a gynecological exam as recommended by their primary provider. All family members should have a colonoscopy by age 76.  FOLLOW-UP: Lastly, we discussed with Melissa Snyder that cancer genetics is a rapidly advancing field and it is possible that new genetic tests will be appropriate for her and/or her family members in the future. We encouraged her to remain in contact with cancer genetics on an annual basis so we can update her personal and family histories and let her know of advances in cancer genetics that may benefit this family.   Our contact number was provided. Melissa Snyder questions were answered to her satisfaction, and she knows she is welcome to call us at anytime with additional  questions or concerns.   Melissa Kayser, MS, [facility] Certified Genetic Counselor [email or web address]

## 2014-07-16 NOTE — Telephone Encounter (Signed)
Revealed negative CancerNExt panel testing.

## 2014-07-16 NOTE — Progress Notes (Signed)
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

## 2014-07-20 ENCOUNTER — Ambulatory Visit
Admission: RE | Admit: 2014-07-20 | Discharge: 2014-07-20 | Disposition: A | Discharge status: Home / Self Care | Payer: Medicare HMO | Source: Ambulatory Visit | Attending: Surgery | Admitting: Surgery

## 2014-07-20 ENCOUNTER — Encounter (HOSPITAL_BASED_OUTPATIENT_CLINIC_OR_DEPARTMENT_OTHER)
Admission: RE | Admit: 2014-07-20 | Discharge: 2014-07-20 | Disposition: A | Discharge status: Home / Self Care | Payer: Medicare HMO | Source: Ambulatory Visit | Attending: Surgery | Admitting: Surgery

## 2014-07-20 ENCOUNTER — Other Ambulatory Visit (INDEPENDENT_AMBULATORY_CARE_PROVIDER_SITE_OTHER): Payer: Self-pay | Admitting: Surgery

## 2014-07-20 DIAGNOSIS — Z90710 Acquired absence of both cervix and uterus: Secondary | ICD-10-CM | POA: Diagnosis not present

## 2014-07-20 DIAGNOSIS — Z888 Allergy status to other drugs, medicaments and biological substances status: Secondary | ICD-10-CM | POA: Diagnosis not present

## 2014-07-20 DIAGNOSIS — E079 Disorder of thyroid, unspecified: Secondary | ICD-10-CM | POA: Diagnosis not present

## 2014-07-20 DIAGNOSIS — Z886 Allergy status to analgesic agent status: Secondary | ICD-10-CM | POA: Diagnosis not present

## 2014-07-20 DIAGNOSIS — K219 Gastro-esophageal reflux disease without esophagitis: Secondary | ICD-10-CM | POA: Diagnosis not present

## 2014-07-20 DIAGNOSIS — C50911 Malignant neoplasm of unspecified site of right female breast: Secondary | ICD-10-CM

## 2014-07-20 DIAGNOSIS — Z7982 Long term (current) use of aspirin: Secondary | ICD-10-CM | POA: Diagnosis not present

## 2014-07-20 DIAGNOSIS — C50411 Malignant neoplasm of upper-outer quadrant of right female breast: Secondary | ICD-10-CM | POA: Diagnosis not present

## 2014-07-20 DIAGNOSIS — Z9049 Acquired absence of other specified parts of digestive tract: Secondary | ICD-10-CM | POA: Diagnosis not present

## 2014-07-20 DIAGNOSIS — I10 Essential (primary) hypertension: Secondary | ICD-10-CM | POA: Diagnosis not present

## 2014-07-20 DIAGNOSIS — E785 Hyperlipidemia, unspecified: Secondary | ICD-10-CM | POA: Diagnosis not present

## 2014-07-20 DIAGNOSIS — I748 Embolism and thrombosis of other arteries: Secondary | ICD-10-CM | POA: Diagnosis not present

## 2014-07-20 DIAGNOSIS — Z85820 Personal history of malignant melanoma of skin: Secondary | ICD-10-CM | POA: Diagnosis not present

## 2014-07-20 DIAGNOSIS — H5442 Blindness, left eye, normal vision right eye: Secondary | ICD-10-CM | POA: Diagnosis not present

## 2014-07-20 LAB — BASIC METABOLIC PANEL
Anion gap: 4 — ABNORMAL LOW (ref 5–15)
BUN: 12 mg/dL (ref 6–23)
CHLORIDE: 105 mmol/L (ref 96–112)
CO2: 32 mmol/L (ref 19–32)
CREATININE: 0.53 mg/dL (ref 0.50–1.10)
Calcium: 9 mg/dL (ref 8.4–10.5)
GFR calc Af Amer: >90 mL/min (ref >90)
GFR calc non Af Amer: 88 mL/min — ABNORMAL LOW (ref >90)
GLUCOSE: 89 mg/dL (ref 70–99)
Potassium: 4.1 mmol/L (ref 3.5–5.1)
Sodium: 141 mmol/L (ref 135–145)

## 2014-07-20 NOTE — Progress Notes (Signed)
Put in twice for Pat appointment should only be one

## 2014-07-22 NOTE — H&P (Signed)
Melissa Snyder 06/01/2014 3:06 PM Location: [facility] Patient #: [id number] DOB: [birth year] Married / Language: Melissa Snyder / Race: White Female  History of Present Illness: Patient words: breast cancer.  The patient is a 81 year old female who presents with breast cancer. Her PCP is Dr. Mable Snyder. She comes by herself.  She has has annual mammograms. She has had no prior breast biopsy. She does have 2 sisters and one daughter with breast cancer. She had a screening mammogram on 04/28/2014 which showed a possible mass in her right breast. The mass on mammogram/US on 05/19/2014 showed a 7 x 6 x 5 mm mass at the 10 o'clock position. She had a right breast biopsy on 05/19/2014 - [id number] - which showed IDC. ER - 100, PR - 35, Ki67 - 8%, Her2Neu - negative.  CT scan of chest - 05/25/2014 - Persistence and similar size of the dominant left lower lobe sub solid nodule. Adenocarcinoma is considered. Biopsy or resection should be strongly considered. These recommendations are taken from:Recommendations for the Management of Subsolid Pulmonary Nodules Detected at CT: A Statement from the [place] 2013; 266:1, 304-317.   I discussed the options for breast cancer treatment with the patient.  I discussed a multidisciplinary approach to the treatment of breast cancer, which includes medical oncology and radiation oncology.  I discussed the surgical options of lumpectomy vs. mastectomy.  If mastectomy, there is the possibility of reconstruction.   I discussed the options of lymph node biopsy.  The treatment plan depends on the pathologic staging of the tumor and the patient's personal wishes.  The risks of surgery include, but are not limited to, bleeding, infection, the need for further surgery, and nerve injury.  Past medical history: She has had a series of calamities this year. 1. 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  Karn Pickler and Elmer Sow for opthalmology.  She also has glaucoma and is talking about laser surgery to prevent this from getting worse 2. Saw Dr [facility] for tumors in brain  She had a GAMMA knife treatment in August 2015. 3. Melanoma left cheek - seen originally by Dr. Allyson Sabal. Jul 2015  They had what sounds like MOH's tx and skin graft.  4. Followed by Dr. Roxan Hockey for pulmnoary nodules.  He last saw her on 05/25/2014. His comments were "It is possible that these nodules represent adenocarcinoma in situ or with lepidic spread, previously termed multifocal bronchoalveolar cell carcinoma. This can often have a very benign course if left alone."  5. Cholecystectomy 1997. Hysterectomy, vaginally 1997. 6. GERD  Last endo/colonoscopy by Dr. Wynetta Snyder - 2012 7. HTN 8. Hyperlipidemia  Social History Married. Her husband had a nodule removed from his bottom. He cannot sit very long.  Addendum Note(Melissa Picking H. Melissa Gaskins MD; 07/06/2014 3:19 PM) From Melissa Snyder "Just wanted to let you know that the 6 gene panel with BRCA on it came back negative. We are progressing on to a larger cancer panel which will be back in a couple weeks, but the initial portion which I think you wanted for surgery decisions is back." DN 07/06/2014  Addendum Note(Melissa Erck H. Melissa Gaskins MD; 07/14/2014 9:38 AM) Melissa Snyder saw Dr. Ky Barban and Dr. Unknown Jim. Both agree she is a lumpectomy candidate. Dr. Pablo Ledger does not need a sentinel lymph node biopsy. Dr. Burr Medico was plus/minus on the SLNBx. Will discuss options with the patient. DN 07/14/2014  Other Problems Melissa Snyder, Melissa Snyder; 06/01/2014  3:06 PM) Arthritis Breast Cancer Gastroesophageal Reflux Disease High blood pressure Melanoma Oophorectomy Bilateral. Thyroid Disease  Past Surgical History Melissa Snyder, [place]; 06/01/2014 3:06 PM) Appendectomy Breast Biopsy Right. Foot Surgery Bilateral. Gallbladder Surgery -  Laparoscopic Hysterectomy (not due to cancer) - Complete  Diagnostic Studies History Melissa Snyder, Melissa Snyder; 06/01/2014 3:06 PM) Colonoscopy 5-10 years ago Mammogram within last year  Allergies Melissa Snyder, Melissa Snyder; 06/01/2014 3:08 PM) TraMADol HCl ER *ANALGESICS - OPIOID* Ibuprofen *ANALGESICS - ANTI-INFLAMMATORY* Niacin (Antihyperlipidemic) *ANTIHYPERLIPIDEMICS*  Medication History Melissa Snyder, Melissa Snyder; 06/01/2014 3:11 PM) Melissa Snyder Aspirin (81MG Tablet Chewable, Oral) Active. Atenolol (50MG Tablet, Oral) Active. B Complex (Oral) Active. Oscal 500/200 D-3 (500-200MG-UNIT Tablet, Oral) Active. Digestive Enzyme (Oral) Active. Omega 3 (1000MG Capsule, Oral) Active. Synthroid (112MCG Tablet, Oral) Active. Multi Vitamin Daily (Oral) Active. Multi Vitamin/Minerals (Oral) Active. Vitamin E Natural (400UNIT Capsule, Oral) Active.  Social History Melissa Snyder, Melissa Snyder; 06/01/2014 3:06 PM) Caffeine use Coffee. No alcohol use No drug use Tobacco use Never smoker.  Family History Melissa Snyder, Melissa Snyder; 06/01/2014 3:06 PM) Arthritis Daughter, Father, Mother. Breast Cancer Daughter, Sister. Cancer Brother. Diabetes Mellitus Brother, Daughter, Sister. Hypertension Daughter, Sister. Kidney Disease Sister. Migraine Headache Daughter. Thyroid problems Brother, Mother.  Pregnancy / Birth History Melissa Snyder, Melissa Snyder; 06/01/2014 3:06 PM) Age at menarche 89 years. Age of menopause 68-60 Gravida 4 Maternal age 47-20 Para 4  Review of Systems Melissa Snyder Melissa Snyder; 06/01/2014 3:06 PM) General Not Present- Appetite Loss, Chills, Fatigue, Fever, Night Sweats, Weight Gain and Weight Loss. Skin Not Present- Change in Wart/Mole, Dryness, Hives, Jaundice, New Lesions, Non-Healing Wounds, Rash and Ulcer. HEENT Not Present- Earache, Hearing Loss, Hoarseness, Nose Bleed, Oral Ulcers, Ringing in the Ears, Seasonal Allergies, Sinus Pain, Sore Throat, Visual  Disturbances, Wears glasses/contact lenses and Yellow Eyes. Respiratory Not Present- Bloody sputum, Chronic Cough, Difficulty Breathing, Snoring and Wheezing. Cardiovascular Not Present- Chest Pain, Difficulty Breathing Lying Down, Leg Cramps, Palpitations, Rapid Heart Rate, Shortness of Breath and Swelling of Extremities. Gastrointestinal Not Present- Abdominal Pain, Bloating, Bloody Stool, Change in Bowel Habits, Chronic diarrhea, Constipation, Difficulty Swallowing, Excessive gas, Gets full quickly at meals, Hemorrhoids, Indigestion, Nausea, Rectal Pain and Vomiting. Female Genitourinary Present- Nocturia. Not Present- Frequency, Painful Urination, Pelvic Pain and Urgency. Musculoskeletal Not Present- Back Pain, Joint Pain, Joint Stiffness, Muscle Pain, Muscle Weakness and Swelling of Extremities. Neurological Present- Headaches. Not Present- Decreased Memory, Fainting, Numbness, Seizures, Tingling, Tremor, Trouble walking and Weakness. Psychiatric Not Present- Anxiety, Bipolar, Change in Sleep Pattern, Depression, Fearful and Frequent crying. Endocrine Not Present- Cold Intolerance, Excessive Hunger, Hair Changes, Heat Intolerance, Hot flashes and New Diabetes. Hematology Not Present- Easy Bruising, Excessive bleeding, Gland problems, HIV and Persistent Infections.   Vitals Melissa Snyder Melissa Snyder; 06/01/2014 3:12 PM) 06/01/2014 3:11 PM Weight: 143.5 lb Height: 64in Body Surface Area: 1.71 m Body Mass Index: 24.63 kg/m Temp.: 98.73F  Pulse: 77 (Regular)  BP: 160/78 (Sitting, Left Arm, Standard)  Physical Exam: General: alert and generally healthy appearing. HEENT: Normal. Blind in left eye.  Neck: Supple. No mass. No thyroid mass. Lymph Nodes: No supraclavicular or cervical nodes.  Lungs: Clear to auscultation and symmetric breath sounds. Heart: RRR. No murmur or rub.  Breasts: Right - scar from core biopsy at 9 o'clock. I do not feel a definite mass. Left - no  mass  Abdomen: Soft. No mass. No tenderness. No hernia. Normal bowel sounds. laparoscopic scars. Rectal: Not done.  Extremities: Good strength and ROM in upper and lower extremities.  Neurologic: Grossly intact to motor  and sensory function. Psychiatric: Has normal mood and affect. Behavior is normal.  Assessment & Plan: 1.  BREAST CANCER, STAGE 1, RIGHT (174.9  C50.911)  Story: The mass on mammogram/US on 05/19/2014 showed a 7 x 6 x 5 mm mass at the 10 o'clock position.  She had a right breast biopsy on 05/19/2014 - [id number] - which showed IDC. ER - 100, PR - 35, Ki67 - 8%, Her2Neu - negative. Impression: Needs med onc and rad onc consult before planning surgery.  She is a candidate for right lumpectomy +/- SLNBx. She would probably only need rad tx or anti estrogen, but not both.  Will discuss with her after consults. Current Plans:   Saw Dr. Nida Boatman Dr. Pablo Ledger.  Schedule for Surgery after seeing med/rad oncology    Saw Melissa Snyder.  2.  BLIND LEFT EYE (369.60  H54.42)   Alphonsa Overall, MD, [facility] Surgery Pager: [phone] Office phone:  [phone]

## 2014-07-23 ENCOUNTER — Encounter (HOSPITAL_BASED_OUTPATIENT_CLINIC_OR_DEPARTMENT_OTHER): Payer: Self-pay

## 2014-07-23 ENCOUNTER — Ambulatory Visit
Admission: RE | Admit: 2014-07-23 | Discharge: 2014-07-23 | Disposition: A | Discharge status: Home / Self Care | Payer: Medicare HMO | Source: Ambulatory Visit | Attending: Surgery | Admitting: Surgery

## 2014-07-23 ENCOUNTER — Ambulatory Visit (HOSPITAL_BASED_OUTPATIENT_CLINIC_OR_DEPARTMENT_OTHER): Payer: Medicare HMO | Admitting: Anesthesiology

## 2014-07-23 ENCOUNTER — Ambulatory Visit (HOSPITAL_BASED_OUTPATIENT_CLINIC_OR_DEPARTMENT_OTHER)
Admission: RE | Admit: 2014-07-23 | Discharge: 2014-07-23 | Disposition: A | Discharge status: Home / Self Care | Payer: Medicare HMO | Source: Ambulatory Visit | Attending: Surgery | Admitting: Surgery

## 2014-07-23 ENCOUNTER — Encounter (HOSPITAL_BASED_OUTPATIENT_CLINIC_OR_DEPARTMENT_OTHER)
Admission: RE | Disposition: A | Discharge status: Home / Self Care | Payer: Self-pay | Source: Ambulatory Visit | Attending: Surgery

## 2014-07-23 ENCOUNTER — Encounter (HOSPITAL_COMMUNITY)
Admission: RE | Admit: 2014-07-23 | Discharge: 2014-07-23 | Disposition: A | Discharge status: Home / Self Care | Payer: Medicare HMO | Source: Ambulatory Visit | Attending: Surgery | Admitting: Surgery

## 2014-07-23 ENCOUNTER — Telehealth: Payer: Self-pay | Admitting: Hematology

## 2014-07-23 DIAGNOSIS — C50411 Malignant neoplasm of upper-outer quadrant of right female breast: Secondary | ICD-10-CM | POA: Diagnosis not present

## 2014-07-23 DIAGNOSIS — H5442 Blindness, left eye, normal vision right eye: Secondary | ICD-10-CM | POA: Diagnosis not present

## 2014-07-23 DIAGNOSIS — I10 Essential (primary) hypertension: Secondary | ICD-10-CM | POA: Insufficient documentation

## 2014-07-23 DIAGNOSIS — E785 Hyperlipidemia, unspecified: Secondary | ICD-10-CM | POA: Insufficient documentation

## 2014-07-23 DIAGNOSIS — E079 Disorder of thyroid, unspecified: Secondary | ICD-10-CM | POA: Insufficient documentation

## 2014-07-23 DIAGNOSIS — Z85820 Personal history of malignant melanoma of skin: Secondary | ICD-10-CM | POA: Insufficient documentation

## 2014-07-23 DIAGNOSIS — K219 Gastro-esophageal reflux disease without esophagitis: Secondary | ICD-10-CM | POA: Insufficient documentation

## 2014-07-23 DIAGNOSIS — Z9049 Acquired absence of other specified parts of digestive tract: Secondary | ICD-10-CM | POA: Insufficient documentation

## 2014-07-23 DIAGNOSIS — Z886 Allergy status to analgesic agent status: Secondary | ICD-10-CM | POA: Insufficient documentation

## 2014-07-23 DIAGNOSIS — Z888 Allergy status to other drugs, medicaments and biological substances status: Secondary | ICD-10-CM | POA: Insufficient documentation

## 2014-07-23 DIAGNOSIS — Z7982 Long term (current) use of aspirin: Secondary | ICD-10-CM | POA: Insufficient documentation

## 2014-07-23 DIAGNOSIS — C50911 Malignant neoplasm of unspecified site of right female breast: Secondary | ICD-10-CM

## 2014-07-23 DIAGNOSIS — I748 Embolism and thrombosis of other arteries: Secondary | ICD-10-CM | POA: Insufficient documentation

## 2014-07-23 DIAGNOSIS — Z90710 Acquired absence of both cervix and uterus: Secondary | ICD-10-CM | POA: Insufficient documentation

## 2014-07-23 HISTORY — PX: BREAST LUMPECTOMY: SHX2

## 2014-07-23 HISTORY — DX: Blindness, one eye, unspecified eye: H54.40

## 2014-07-23 LAB — POCT HEMOGLOBIN-HEMACUE: HEMOGLOBIN: 12.1 g/dL (ref 12.0–15.0)

## 2014-07-23 SURGERY — BREAST LUMPECTOMY WITH RADIOACTIVE SEED AND SENTINEL LYMPH NODE BIOPSY
Anesthesia: Regional | Site: Breast | Laterality: Right

## 2014-07-23 MED ORDER — DEXAMETHASONE SODIUM PHOSPHATE 4 MG/ML IJ SOLN
INTRAMUSCULAR | Status: DC | PRN
  Administered 2014-07-23: 10 mg via INTRAVENOUS

## 2014-07-23 MED ORDER — OXYCODONE HCL 5 MG/5ML PO SOLN: 5.0000 mg | Freq: Once | ORAL | Status: DC | PRN

## 2014-07-23 MED ORDER — METHYLENE BLUE 1 % INJ SOLN
INTRAMUSCULAR | Status: AC
  Filled 2014-07-23: qty 10

## 2014-07-23 MED ORDER — MIDAZOLAM HCL 2 MG/2ML IJ SOLN
INTRAMUSCULAR | Status: AC
  Filled 2014-07-23: qty 2

## 2014-07-23 MED ORDER — ONDANSETRON HCL 4 MG/2ML IJ SOLN: 4.0000 mg | Freq: Four times a day (QID) | INTRAMUSCULAR | Status: DC | PRN

## 2014-07-23 MED ORDER — FENTANYL CITRATE 0.05 MG/ML IJ SOLN
INTRAMUSCULAR | Status: DC | PRN
  Administered 2014-07-23 (×2): 50 ug via INTRAVENOUS

## 2014-07-23 MED ORDER — FENTANYL CITRATE 0.05 MG/ML IJ SOLN
50.0000 ug | INTRAMUSCULAR | Status: DC | PRN
  Administered 2014-07-23: 100 ug via INTRAVENOUS

## 2014-07-23 MED ORDER — FENTANYL CITRATE 0.05 MG/ML IJ SOLN
INTRAMUSCULAR | Status: AC
  Filled 2014-07-23: qty 2

## 2014-07-23 MED ORDER — OXYCODONE HCL 5 MG PO TABS: 5.0000 mg | ORAL_TABLET | Freq: Once | ORAL | Status: DC | PRN

## 2014-07-23 MED ORDER — LIDOCAINE HCL (CARDIAC) 20 MG/ML IV SOLN
INTRAVENOUS | Status: DC | PRN
  Administered 2014-07-23: 30 mg via INTRAVENOUS

## 2014-07-23 MED ORDER — CHLORHEXIDINE GLUCONATE 4 % EX LIQD: 1.0000 "application " | Freq: Once | CUTANEOUS | Status: DC

## 2014-07-23 MED ORDER — CEFAZOLIN SODIUM-DEXTROSE 2-3 GM-% IV SOLR
2.0000 g | INTRAVENOUS | Status: AC
  Administered 2014-07-23: 2 g via INTRAVENOUS

## 2014-07-23 MED ORDER — CEFAZOLIN SODIUM-DEXTROSE 2-3 GM-% IV SOLR
INTRAVENOUS | Status: AC
  Filled 2014-07-23: qty 50

## 2014-07-23 MED ORDER — PROPOFOL 10 MG/ML IV BOLUS
INTRAVENOUS | Status: DC | PRN
  Administered 2014-07-23: 150 mg via INTRAVENOUS

## 2014-07-23 MED ORDER — HEPARIN (PORCINE) IN NACL 2-0.9 UNIT/ML-% IJ SOLN
INTRAMUSCULAR | Status: AC
  Filled 2014-07-23: qty 500

## 2014-07-23 MED ORDER — LACTATED RINGERS IV SOLN
INTRAVENOUS | Status: DC
  Administered 2014-07-23: 07:00:00 via INTRAVENOUS

## 2014-07-23 MED ORDER — BUPIVACAINE-EPINEPHRINE (PF) 0.5% -1:200000 IJ SOLN
INTRAMUSCULAR | Status: DC | PRN
  Administered 2014-07-23: 30 mL via PERINEURAL

## 2014-07-23 MED ORDER — BUPIVACAINE-EPINEPHRINE (PF) 0.5% -1:200000 IJ SOLN
INTRAMUSCULAR | Status: DC | PRN
  Administered 2014-07-23: 14 mL

## 2014-07-23 MED ORDER — FENTANYL CITRATE 0.05 MG/ML IJ SOLN
INTRAMUSCULAR | Status: AC
  Filled 2014-07-23: qty 6

## 2014-07-23 MED ORDER — ONDANSETRON HCL 4 MG/2ML IJ SOLN
INTRAMUSCULAR | Status: DC | PRN
  Administered 2014-07-23: 4 mg via INTRAVENOUS

## 2014-07-23 MED ORDER — BUPIVACAINE-EPINEPHRINE (PF) 0.25% -1:200000 IJ SOLN
INTRAMUSCULAR | Status: AC
  Filled 2014-07-23: qty 30

## 2014-07-23 MED ORDER — HYDROMORPHONE HCL 1 MG/ML IJ SOLN: 0.2500 mg | INTRAMUSCULAR | Status: DC | PRN

## 2014-07-23 MED ORDER — HYDROCODONE-ACETAMINOPHEN 5-325 MG PO TABS: 1.0000 | ORAL_TABLET | Freq: Four times a day (QID) | ORAL | Status: DC | PRN

## 2014-07-23 MED ORDER — HEPARIN SOD (PORK) LOCK FLUSH 100 UNIT/ML IV SOLN
INTRAVENOUS | Status: AC
  Filled 2014-07-23: qty 5

## 2014-07-23 MED ORDER — TECHNETIUM TC 99M SULFUR COLLOID FILTERED
1.0000 | Freq: Once | INTRAVENOUS | Status: AC | PRN
  Administered 2014-07-23: 1 via INTRADERMAL

## 2014-07-23 MED ORDER — MIDAZOLAM HCL 2 MG/2ML IJ SOLN
1.0000 mg | INTRAMUSCULAR | Status: DC | PRN
  Administered 2014-07-23: 2 mg via INTRAVENOUS

## 2014-07-23 MED ORDER — SODIUM CHLORIDE 0.9 % IJ SOLN
INTRAMUSCULAR | Status: AC
  Filled 2014-07-23: qty 10

## 2014-07-23 MED ORDER — BUPIVACAINE HCL (PF) 0.25 % IJ SOLN
INTRAMUSCULAR | Status: AC
  Filled 2014-07-23: qty 30

## 2014-07-23 SURGICAL SUPPLY — 55 items
APL SKNCLS STERI-STRIP NONHPOA (GAUZE/BANDAGES/DRESSINGS)
BENZOIN TINCTURE PRP APPL 2/3 (GAUZE/BANDAGES/DRESSINGS) IMPLANT
BINDER BREAST LRG (GAUZE/BANDAGES/DRESSINGS) IMPLANT
BINDER BREAST MEDIUM (GAUZE/BANDAGES/DRESSINGS) ×1 IMPLANT
BINDER BREAST XLRG (GAUZE/BANDAGES/DRESSINGS) IMPLANT
BINDER BREAST XXLRG (GAUZE/BANDAGES/DRESSINGS) IMPLANT
BLADE HEX COATED 2.75 (ELECTRODE) IMPLANT
BLADE SURG 10 STRL SS (BLADE) ×2 IMPLANT
BLADE SURG 15 STRL LF DISP TIS (BLADE) ×1 IMPLANT
BLADE SURG 15 STRL SS (BLADE) ×2
CANISTER SUC SOCK COL 7IN (MISCELLANEOUS) IMPLANT
CANISTER SUCT 1200ML W/VALVE (MISCELLANEOUS) ×2 IMPLANT
CHLORAPREP W/TINT 26ML (MISCELLANEOUS) ×2 IMPLANT
CLIP TI WIDE RED SMALL 6 (CLIP) ×2 IMPLANT
COVER BACK TABLE 60X90IN (DRAPES) ×2 IMPLANT
COVER MAYO STAND STRL (DRAPES) ×2 IMPLANT
COVER PROBE W GEL 5X96 (DRAPES) ×2 IMPLANT
DECANTER SPIKE VIAL GLASS SM (MISCELLANEOUS) IMPLANT
DEVICE DUBIN W/COMP PLATE 8390 (MISCELLANEOUS) ×2 IMPLANT
DRAPE LAPAROTOMY 100X72 PEDS (DRAPES) ×2 IMPLANT
DRAPE UTILITY XL STRL (DRAPES) ×2 IMPLANT
DRSG PAD ABDOMINAL 8X10 ST (GAUZE/BANDAGES/DRESSINGS) IMPLANT
ELECT COATED BLADE 2.86 ST (ELECTRODE) ×2 IMPLANT
ELECT REM PT RETURN 9FT ADLT (ELECTROSURGICAL) ×2
ELECTRODE REM PT RTRN 9FT ADLT (ELECTROSURGICAL) ×1 IMPLANT
GLOVE BIOGEL PI IND STRL 6.5 (GLOVE) IMPLANT
GLOVE BIOGEL PI IND STRL 7.5 (GLOVE) IMPLANT
GLOVE BIOGEL PI INDICATOR 6.5 (GLOVE) ×1
GLOVE BIOGEL PI INDICATOR 7.5 (GLOVE) ×1
GLOVE SURG SIGNA 7.5 PF LTX (GLOVE) ×3 IMPLANT
GLOVE SURG SS PI 6.5 STRL IVOR (GLOVE) ×1 IMPLANT
GLOVE SURG SS PI 7.5 STRL IVOR (GLOVE) ×1 IMPLANT
GOWN STRL REUS W/ TWL LRG LVL3 (GOWN DISPOSABLE) ×1 IMPLANT
GOWN STRL REUS W/ TWL XL LVL3 (GOWN DISPOSABLE) ×1 IMPLANT
GOWN STRL REUS W/TWL LRG LVL3 (GOWN DISPOSABLE) ×4
GOWN STRL REUS W/TWL XL LVL3 (GOWN DISPOSABLE) ×2
KIT MARKER MARGIN INK (KITS) ×2 IMPLANT
LIQUID BAND (GAUZE/BANDAGES/DRESSINGS) ×2 IMPLANT
NDL HYPO 25X1 1.5 SAFETY (NEEDLE) ×1 IMPLANT
NEEDLE HYPO 25X1 1.5 SAFETY (NEEDLE) ×2 IMPLANT
NS IRRIG 1000ML POUR BTL (IV SOLUTION) ×1 IMPLANT
PACK BASIN DAY SURGERY FS (CUSTOM PROCEDURE TRAY) ×2 IMPLANT
PENCIL BUTTON HOLSTER BLD 10FT (ELECTRODE) ×2 IMPLANT
SHEET MEDIUM DRAPE 40X70 STRL (DRAPES) IMPLANT
SLEEVE SCD COMPRESS KNEE MED (MISCELLANEOUS) ×2 IMPLANT
SPONGE GAUZE 4X4 12PLY STER LF (GAUZE/BANDAGES/DRESSINGS) IMPLANT
SPONGE LAP 18X18 X RAY DECT (DISPOSABLE) ×2 IMPLANT
STRIP CLOSURE SKIN 1/4X4 (GAUZE/BANDAGES/DRESSINGS) IMPLANT
SUT MON AB 5-0 PS2 18 (SUTURE) ×2 IMPLANT
SUT VICRYL 3-0 CR8 SH (SUTURE) ×2 IMPLANT
SYR CONTROL 10ML LL (SYRINGE) ×2 IMPLANT
TOWEL OR 17X24 6PK STRL BLUE (TOWEL DISPOSABLE) ×2 IMPLANT
TOWEL OR NON WOVEN STRL DISP B (DISPOSABLE) ×2 IMPLANT
TUBE CONNECTING 20X1/4 (TUBING) ×2 IMPLANT
YANKAUER SUCT BULB TIP NO VENT (SUCTIONS) ×2 IMPLANT

## 2014-07-23 NOTE — Telephone Encounter (Signed)
Patient called and moved appointment to 02/29 to have results back from pathology.

## 2014-07-23 NOTE — Discharge Instructions (Signed)
[facility] - DISCHARGE INSTRUCTIONS TO PATIENT  Activity:  Driving - May drive in 1 or 2 days, if doing well.   Lifting - Take it easy for 5 days, then no limit  Wound Care:   Leave incision dry for 2 days, then remove bandage and may shower.  Diet:  As tolerated  Follow up appointment:  You have an appointment in about 2 weeks.  Call Dr. Pollie Friar office [facility]) at [phone] for any change.  Medications and dosages:  Resume your home medications.  You have a prescription for:  Vicodin  Call Dr. Lucia Gaskins or his office  [phone]) if you have:  Temperature greater than 100.4,  Persistent nausea and vomiting,  Severe uncontrolled pain,  Redness, tenderness, or signs of infection (pain, swelling, redness, odor or green/yellow discharge around the site),  Difficulty breathing, headache or visual disturbances,  Any other questions or concerns you may have after discharge.  In an emergency, call 911 or go to an Emergency Department at a nearby hospital.    Post Anesthesia Home Care Instructions  Activity: Get plenty of rest for the remainder of the day. A responsible adult should stay with you for 24 hours following the procedure.  For the next 24 hours, DO NOT: -Drive a car -Paediatric nurse -Drink alcoholic beverages -Take any medication unless instructed by your physician -Make any legal decisions or sign important papers.  Meals: Start with liquid foods such as gelatin or soup. Progress to regular foods as tolerated. Avoid greasy, spicy, heavy foods. If nausea and/or vomiting occur, drink only clear liquids until the nausea and/or vomiting subsides. Call your physician if vomiting continues.  Special Instructions/Symptoms: Your throat may feel dry or sore from the anesthesia or the breathing tube placed in your throat during surgery. If this causes discomfort, gargle with warm salt water. The discomfort should disappear within 24 hours.

## 2014-07-23 NOTE — Transfer of Care (Signed)
Immediate Anesthesia Transfer of Care Note  Patient: Melissa Snyder  Procedure(s) Performed: Procedure(s): RIGHT BREAST LUMPECTOMY WITH RADIOACTIVE SEED AND RIGHT AXILLARYSENTINEL LYMPH NODE BIOPSY (Right)  Patient Location: PACU  Anesthesia Type:GA combined with regional for post-op pain  Level of Consciousness: sedated and patient cooperative  Airway & Oxygen Therapy: Patient Spontanous Breathing and Patient connected to face mask oxygen  Post-op Assessment: Report given to RN and Post -op Vital signs reviewed and stable  Post vital signs: Reviewed and stable  Last Vitals:  Filed Vitals:   07/23/14 0735  BP:   Pulse: 66  Temp:   Resp: 16    Complications: No apparent anesthesia complications

## 2014-07-23 NOTE — Op Note (Addendum)
07/23/2014  8:47 AM  PATIENT:  Melissa Snyder DOB: 05-May-1934 MRN: [id number]  PREOP DIAGNOSIS:  right breast cancer  POSTOP DIAGNOSIS:   Right breast Cancer  , 10 o'clock position (T1, N0)  PROCEDURE:   Procedure(s):  RIGHT BREAST LUMPECTOMY WITH RADIOACTIVE SEED AND RIGHT AXILLARYSENTINEL LYMPH NODE BIOPSY, (single incision)  SURGEON:   Alphonsa Overall, M.D.  ANESTHESIA:   general  Anesthesiologist: Albertha Ghee, MD CRNA: Marrianne Mood, CRNA  General  EBL:  minimal  ml  DRAINS: none   LOCAL MEDICATIONS USED:   15 cc 1/4% marcaine - pectoral block  SPECIMEN:   Right breast lumpectomy (suture medial) and right axillary sentinel lymph node (counts 1200/background 40)  COUNTS CORRECT:  YES  INDICATIONS FOR PROCEDURE:  Melissa Snyder is a 81 y.o. (DOB: 28-Oct-1933) white  female whose primary care physician is PERINI,MARK A, MD and comes for right breast lumpectomy and right axillary sentinel lymph node biopsy.  Her treating oncologist are Drs. Burr Medico and [place].   The options for breast cancer treatment have been discussed with the patient. She elected to proceed with lumpectomy and axillary sentinel lymph node.     The indications and potential complications of surgery were explained to the patient. Potential complications include, but are not limited to, bleeding, infection, the need for further surgery, and nerve injury.     She had a I131 seed placed on  in her right breast at [facility].  I confirmed the presence of the I131 seed in the pre op area using the Neoprobe.  The seed is in the 10 o'clock position of the right breast.   In the holding area, her right areola was injected with 1 millicurie of Technitium Sulfur Colloid.  OPERATIVE NOTE:   The patient was taken to room # 8 at [facility] where she underwent a general anesthesia  supervised by Anesthesiologist: Albertha Ghee, MD CRNA: Marrianne Mood, CRNA. Her right breast and axilla were prepped with   ChloraPrep and sterilely draped.    A time-out and the surgical check list was reviewed.  I did not inject methylene blue.  I found adequate counts in the right axilla with the neoprobe.   I turned attention to the cancer which was about at the 10 o'clock position of the right breast.   I used the Neoprobe to identify the I131 seed.  I tried to excise an area around the tumor of at least 1 cm.  I placed a suture in the medial aspect of the specimen    I excised this block of breast tissue approximately 4 cm by 4 cm  in diameter.   I painted the lumpectomy specimen with the 6 color paint kit and did a specimen mammogram which confirmed the mass, clip, and the seed were all in the right position and the specimen.  The specimen was sent to pathology who called back to confirm that they have the seed and the specimen.   I then started the right axillary sentinel lymph node biopsy. Because the caner is in the upper outer quadrant of the right breast, I was able to go through the lumpectomy cavity to get to the axilla.   I found a hot area at the junction of the breast and the pectoralis major muscle. I cut down and  identified a hot node that had counts of 1200 and the background has 40 counts. I checked her internal mammary nodes and supraclavicular nodes with the  neoprobe and found no other hot area. The axillary node was then sent to pathology.    I then irrigated the wound with saline. I infiltrated approximately 15 mL of 1/4% marcaine in the incision.  I placed 6 clips to mark biopsy cavity, at 12, 3, 6, and 9 o'clock. Two clips were placed on the pectoralis major.   I then closed all the wounds in layers using 3-0 Vicryl sutures for the deep layer. At the skin, I closed the incisions with a 5-0 Monocryl suture. The incisions were then painted with Dermabond.  She had gauze place over the wounds and placed in a breast binder.   The patient tolerated the procedure well, was transported to the recovery room  in good condition. Sponge and needle count were correct at the end of the case.   Final pathology is pending.   Alphonsa Overall, MD, [facility] Pager: [phone] Office phone:  [phone]

## 2014-07-23 NOTE — Anesthesia Procedure Notes (Addendum)
Procedure Name: LMA Insertion Date/Time: 07/23/2014 7:46 AM Performed by: Toula Moos L Pre-anesthesia Checklist: Patient identified, Emergency Drugs available, Suction available, Patient being monitored and Timeout performed Patient Re-evaluated:Patient Re-evaluated prior to inductionOxygen Delivery Method: Circle System Utilized Preoxygenation: Pre-oxygenation with 100% oxygen Intubation Type: IV induction Ventilation: Mask ventilation without difficulty LMA: LMA inserted LMA Size: 4.0 Number of attempts: 1 Airway Equipment and Method: Bite block Placement Confirmation: positive ETCO2 Tube secured with: Tape Dental Injury: Teeth and Oropharynx as per pre-operative assessment    Anesthesia Regional Block:  Pectoralis block  Pre-Anesthetic Checklist: ,, timeout performed, Correct Patient, Correct Site, Correct Laterality, Correct Procedure, Correct Position, site marked, Risks and benefits discussed,  Surgical consent,  Pre-op evaluation,  At surgeon's request and post-op pain management  Laterality: Right  Prep: chloraprep       Needles:   Needle Type: Echogenic Needle     Needle Length: 9cm 9 cm Needle Gauge: 21 and 21 G    Additional Needles:  Procedures: ultrasound guided (picture in chart) Pectoralis block Narrative:  Start time: 07/23/2014 7:15 AM End time: 07/23/2014 7:25 AM Injection made incrementally with aspirations every 5 mL. Anesthesiologist: Drayton Tieu  Additional Notes: Pt tolerated the procedure well.

## 2014-07-23 NOTE — Anesthesia Postprocedure Evaluation (Signed)
Anesthesia Post Note  Patient: Melissa Snyder  Procedure(s) Performed: Procedure(s) (LRB): RIGHT BREAST LUMPECTOMY WITH RADIOACTIVE SEED AND RIGHT AXILLARYSENTINEL LYMPH NODE BIOPSY (Right)  Anesthesia type: General  Patient location: PACU  Post pain: Pain level controlled and Adequate analgesia  Post assessment: Post-op Vital signs reviewed, Patient's Cardiovascular Status Stable, Respiratory Function Stable, Patent Airway and Pain level controlled  Last Vitals:  Filed Vitals:   07/23/14 0900  BP: 162/66  Pulse: 68  Temp:   Resp: 15    Post vital signs: Reviewed and stable  Level of consciousness: awake, alert  and oriented  Complications: No apparent anesthesia complications

## 2014-07-23 NOTE — Interval H&P Note (Signed)
History and Physical Interval Note:  07/23/2014 7:06 AM  [name]  has presented today for surgery, with the diagnosis of right breast cancer  The various methods of treatment have been discussed with the patient and family.  Seed identified with neoprobe.  Husband at bedside.  After consideration of risks, benefits and other options for treatment, the patient has consented to  Procedure(s): RIGHT BREAST LUMPECTOMY WITH RADIOACTIVE SEED AND RIGHT AXILLARYSENTINEL LYMPH NODE BIOPSY (Right) as a surgical intervention .  The patient's history has been reviewed, patient examined, no change in status, stable for surgery.  I have reviewed the patient's chart and labs.  Questions were answered to the patient's satisfaction.     [place] [facility]

## 2014-07-23 NOTE — Anesthesia Preprocedure Evaluation (Signed)
Anesthesia Evaluation  Patient identified by MRN, date of birth, ID band Patient awake    Reviewed: Allergy & Precautions, NPO status , Patient's Chart, lab work & pertinent test results  Airway Mallampati: II   Neck ROM: full    Dental   Pulmonary          Cardiovascular hypertension, + Peripheral Vascular Disease  LBBB   Neuro/Psych  Headaches, Blind in left eye CVA, Residual Symptoms    GI/Hepatic GERD-  ,  Endo/Other  Hypothyroidism   Renal/GU      Musculoskeletal  (+) Arthritis -,   Abdominal   Peds  Hematology   Anesthesia Other Findings   Reproductive/Obstetrics                             Anesthesia Physical Anesthesia Plan  ASA: III  Anesthesia Plan: General and Regional   Post-op Pain Management: MAC Combined w/ Regional for Post-op pain   Induction: Intravenous  Airway Management Planned: LMA  Additional Equipment:   Intra-op Plan:   Post-operative Plan:   Informed Consent: I have reviewed the patients History and Physical, chart, labs and discussed the procedure including the risks, benefits and alternatives for the proposed anesthesia with the patient or authorized representative who has indicated his/her understanding and acceptance.     Plan Discussed with: CRNA, Anesthesiologist and Surgeon  Anesthesia Plan Comments:         Anesthesia Quick Evaluation

## 2014-07-23 NOTE — Progress Notes (Signed)
Assisted Dr. Marcie Bal with right, ultrasound guided, pectoralis block. Side rails up, monitors on throughout procedure. See vital signs in flow sheet. Tolerated Procedure well.

## 2014-07-26 ENCOUNTER — Other Ambulatory Visit: Payer: Medicare HMO

## 2014-07-26 ENCOUNTER — Ambulatory Visit: Payer: Medicare HMO | Admitting: Hematology

## 2014-08-02 ENCOUNTER — Telehealth: Payer: Self-pay | Admitting: Hematology

## 2014-08-02 ENCOUNTER — Encounter: Payer: Self-pay | Admitting: Hematology

## 2014-08-02 ENCOUNTER — Ambulatory Visit (HOSPITAL_BASED_OUTPATIENT_CLINIC_OR_DEPARTMENT_OTHER): Payer: Medicare HMO | Admitting: Hematology

## 2014-08-02 ENCOUNTER — Other Ambulatory Visit (HOSPITAL_BASED_OUTPATIENT_CLINIC_OR_DEPARTMENT_OTHER): Payer: Medicare HMO

## 2014-08-02 VITALS — BP 160/63 | HR 72 | Temp 97.6°F | Resp 18 | Ht 66.0 in | Wt 142.1 lb

## 2014-08-02 DIAGNOSIS — R911 Solitary pulmonary nodule: Secondary | ICD-10-CM

## 2014-08-02 DIAGNOSIS — C50911 Malignant neoplasm of unspecified site of right female breast: Secondary | ICD-10-CM

## 2014-08-02 DIAGNOSIS — Z170 Estrogen receptor positive status [ER+]: Secondary | ICD-10-CM

## 2014-08-02 LAB — COMPREHENSIVE METABOLIC PANEL (CC13)
ALT: 22 U/L (ref 0–55)
ANION GAP: 8 meq/L (ref 3–11)
AST: 14 U/L (ref 5–34)
Albumin: 3.6 g/dL (ref 3.5–5.0)
Alkaline Phosphatase: 87 U/L (ref 40–150)
BUN: 14.5 mg/dL (ref 7.0–26.0)
CALCIUM: 9.2 mg/dL (ref 8.4–10.4)
CHLORIDE: 107 meq/L (ref 98–109)
CO2: 26 meq/L (ref 22–29)
CREATININE: 0.7 mg/dL (ref 0.6–1.1)
EGFR: 78 mL/min/{1.73_m2} — ABNORMAL LOW (ref >90)
GLUCOSE: 151 mg/dL — AB (ref 70–140)
Potassium: 4.3 mEq/L (ref 3.5–5.1)
Sodium: 141 mEq/L (ref 136–145)
Total Bilirubin: 0.45 mg/dL (ref 0.20–1.20)
Total Protein: 6.9 g/dL (ref 6.4–8.3)

## 2014-08-02 LAB — CBC WITH DIFFERENTIAL/PLATELET
BASO%: 1.1 % (ref 0.0–2.0)
BASOS ABS: 0.1 10*3/uL (ref 0.0–0.1)
EOS ABS: 0.4 10*3/uL (ref 0.0–0.5)
EOS%: 5.6 % (ref 0.0–7.0)
HEMATOCRIT: 35.7 % (ref 34.8–46.6)
HGB: 11.6 g/dL (ref 11.6–15.9)
LYMPH%: 19.1 % (ref 14.0–49.7)
MCH: 28.5 pg (ref 25.1–34.0)
MCHC: 32.6 g/dL (ref 31.5–36.0)
MCV: 87.6 fL (ref 79.5–101.0)
MONO#: 0.5 10*3/uL (ref 0.1–0.9)
MONO%: 7.5 % (ref 0.0–14.0)
NEUT#: 4.4 10*3/uL (ref 1.5–6.5)
NEUT%: 66.7 % (ref 38.4–76.8)
Platelets: 193 10*3/uL (ref 145–400)
RBC: 4.08 10*6/uL (ref 3.70–5.45)
RDW: 14.1 % (ref 11.2–14.5)
WBC: 6.6 10*3/uL (ref 3.9–10.3)
lymph#: 1.3 10*3/uL (ref 0.9–3.3)

## 2014-08-02 MED ORDER — ANASTROZOLE 1 MG PO TABS: 1.0000 mg | ORAL_TABLET | Freq: Every day | ORAL | Status: DC

## 2014-08-02 NOTE — Telephone Encounter (Signed)
gv and printed appt sched anda vs for pt for March.... °

## 2014-08-02 NOTE — Progress Notes (Signed)
Melissa Snyder  [phone] [phone]  Clinic New Consult Note   Patient Care Team: Jerlyn Ly, MD as PCP - General (Internal Medicine) [name], MD as Consulting Physician (Cardiothoracic Surgery) Reece Packer, MD as Consulting Physician (Urology) [name], MD as Consulting Physician (Hematology) [name], MD as Consulting Physician (Radiation Oncology) 08/02/2014  CHIEF COMPLAINTS:  Follow up right breast caner   HISTORY OF PRESENTING ILLNESS:  Melissa Snyder 81 y.o. female is here because of newly diagnosed breast cancer. She was referred by Dr. Lucia Gaskins. She came in today by herself.   This was found on screening mammogram on 05/03/2014, and the diagnosed mammogram and US showed a 57m lesion at 10:00 of right breast. She underwent biopsy and it showed IDA, ER+/PR+/HER2-.   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 She was also found multiple small LLL lung nodules during that time, which is followed by pulmonologist Dr. HRoxan Hockey   She feels well in general, left eye blindness has not changed. No pain, cough, abdominal discomfort or other complains. No weight loss.   INTERIM HISTORY: LAilenreturns for follow-up with her husband. She underwent right lumpectomy with sentinel lymph nodes biopsy by Dr. NLucia Gaskinson 07/23/2014. She tolerated well, mild soreness at this incision site, for which she takes Tylenol. She otherwise feels well, no other new complaints.  MEDICAL HISTORY:  Past Medical History  Diagnosis Date  . Chest pain   . Bundle branch block left   . Migraine   . Hypothyroid   . Hypertension   . Arthritis     OA  . Hyperlipidemia   . GERD (gastroesophageal reflux disease)   . Melanoma in situ of face JUNE 2015    LEFT CHEEK  . Breast cancer 05/2014    ER+/PR+/Her2-  . Family history of breast cancer   . Family history of bladder cancer   . Family  history of prostate cancer   . Blind left eye 10/2013    cva  stroke in May 2015, with residual left vision loss.   SURGICAL HISTORY: Past Surgical History  Procedure Laterality Date  . Dilation and curettage of uterus      X 2  . Abdominal hysterectomy    . Bilateral salpingoophorectomy    . Bladder repair  2009    cystocele  . Cholecystectomy    . Melanoma excision Left 11/05/23, 11/30/13    CHEEK  . Bunionectomy      bilateral great toe  . Ct radiation therapy guide      Gamma radiation -lt frontal-[facility]  . Cardiac catheterization  2004  . Appendectomy    . Colonoscopy      SOCIAL HISTORY: History   Social History  . Marital Status: Married    Spouse Name: N/A  . Number of Children: 4  . Years of Education: N/A   Occupational History  . Not on file.   Social History Main Topics  . Smoking status: Never Smoker   . Smokeless tobacco: Never Used  . Alcohol Use: No  . Drug Use: No  . Sexual Activity: Not on file   Other Topics Concern  . Not on file   Social History Narrative    FAMILY HISTORY: Family History  Problem Relation Age of Onset  . CAD Mother     CABG  . Hyperlipidemia Mother   . AAA (abdominal aortic aneurysm) Mother   .  Renal Disease Father   . Kidney disease Father   . CAD Father   . Cancer Father     PROSTATE  . Diabetes Brother   . Hyperlipidemia Brother   . Breast cancer Daughter 67  . Breast cancer Sister 17  . Breast cancer Sister 34  . Cancer Brother     NOS  . Bladder Cancer Brother 44  . Breast cancer Cousin     five paternal first cousins  . Lung cancer Cousin     3 paternal cousins with lung cancer  . Cancer Cousin     4 paternal first cousins with Cancer NOS  . Cancer Cousin     1 paternal cousin with oral cancer (tongue)   GYN HISTORY  Menarchal: 37 LMP: 33 (hsrectomy)  Contraceptive: a few years  HRT: 4 years.  G4P4:   ALLERGIES:  is allergic to tramadol; ibuprofen; and niacin and related.  MEDICATIONS:    Current Outpatient Prescriptions  Medication Sig Dispense Refill  . aspirin 81 MG chewable tablet Chew 1 tablet (81 mg total) by mouth daily. 30 tablet 0  . atenolol (TENORMIN) 50 MG tablet Take 50 mg by mouth at bedtime.     Marland Kitchen b complex vitamins capsule Take 1 capsule by mouth daily.    . calcium-vitamin D (OSCAL WITH D) 500-200 MG-UNIT per tablet Take 1 tablet by mouth every morning.    . Digestive Enzymes (DIGESTIVE ENZYME PO) Take 1 capsule by mouth as needed.    . fish oil-omega-3 fatty acids 1000 MG capsule Take 1 g by mouth every morning.     Marland Kitchen HYDROcodone-acetaminophen (NORCO/VICODIN) 5-325 MG per tablet Take 1-2 tablets by mouth every 6 (six) hours as needed. 30 tablet 0  . levothyroxine (SYNTHROID, LEVOTHROID) 112 MCG tablet Take 112 mcg by mouth every morning.     . Multiple Vitamin (MULTIVITAMIN) tablet Take 1 tablet by mouth every morning.     . Multiple Vitamins-Minerals (VISION FORMULA PO) Take 1 capsule by mouth daily.    . vitamin E (E-400) 400 UNIT capsule Take 400 Units by mouth daily.     No current facility-administered medications for this visit.    REVIEW OF SYSTEMS:   Constitutional: Denies fevers, chills or abnormal night sweats Eyes: Denies blurriness of vision, double vision or watery eyes Ears, nose, mouth, throat, and face: Denies mucositis or sore throat Respiratory: Denies cough, dyspnea or wheezes Cardiovascular: Denies palpitation, chest discomfort or lower extremity swelling Gastrointestinal:  Denies nausea, heartburn or change in bowel habits Skin: Denies abnormal skin rashes Lymphatics: Denies new lymphadenopathy or easy bruising Neurological:Denies numbness, tingling or new weaknesses Behavioral/Psych: Mood is stable, no new changes  All other systems were reviewed with the patient and are negative.  PHYSICAL EXAMINATION: ECOG PERFORMANCE STATUS: 0 - Asymptomatic  Filed Vitals:   08/02/14 1238  BP: 160/63  Pulse: 72  Temp: 97.6 F (36.4 C)   Resp: 18   Filed Weights   08/02/14 1238  Weight: 142 lb 1.6 oz (64.456 kg)    GENERAL:alert, no distress and comfortable SKIN: skin color, texture, turgor are normal, no rashes or significant lesions EYES: normal, conjunctiva are pink and non-injected, sclera clear OROPHARYNX:no exudate, no erythema and lips, buccal mucosa, and tongue normal  NECK: supple, thyroid normal size, non-tender, without nodularity LYMPH:  no palpable lymphadenopathy in the cervical, axillary or inguinal LUNGS: clear to auscultation and percussion with normal breathing effort HEART: regular rate & rhythm and no murmurs and no  lower extremity edema ABDOMEN:abdomen soft, non-tender and normal bowel sounds Musculoskeletal:no cyanosis of digits and no clubbing  PSYCH: alert & oriented x 3 with fluent speech NEURO: no focal motor/sensory deficits Breasts: Breast inspection showed them to be symmetrical with no nipple discharge. The surgical incision at the outer quadrant of right breast is healing well. Palpation of the breasts and axilla revealed no obvious mass that I could appreciate.   LABORATORY DATA:  I have reviewed the data as listed Lab Results  Component Value Date   WBC 6.6 08/02/2014   HGB 11.6 08/02/2014   HCT 35.7 08/02/2014   MCV 87.6 08/02/2014   PLT 193 08/02/2014    Recent Labs  10/24/13 1324 10/24/13 2108 07/20/14 1030  NA 140  --  141  K 4.3  --  4.1  CL 101  --  105  CO2 27  --  32  GLUCOSE 90  --  89  BUN 16  --  12  CREATININE 0.66 0.84 0.53  CALCIUM 9.5  --  9.0  GFRNONAA 82* 65* 88*  GFRAA >90 75* >90    GENETICS: Negative BRCAPlus testing from [employer] [facility].  The BRCAPlus gene panel offered by [facility] and includes sequencing and rearrangement analysis for the following 6 genes: BRCA1, BRCA2, CDH1, PALB2, PTEN, and TP53.  Report date is 06/21/2014.    Negative CancerNext panel testing testing.  The CancerNext gene panel offered by [employer] and includes  sequencing and rearrangement analysis for the following 32 genes:   APC, ATM, BARD1, BMPR1A, BRCA1, BRCA2, BRIP1, CDH1, CDK4, CDKN2A, CHEK2, EPCAM, GREM1, MLH1, MRE11A, MSH2, MSH6, MUTYH, NBN, NF1, PALB2, PMS2, POLD1, POLE, PTEN, RAD50, RAD51D, SMAD4, SMARCA4, STK11, and TP53.  The report date is: July 15, 2014.  PAHTOLOGY REPORT  1. Breast, lumpectomy, right 07/23/2014 - INVASIVE DUCTAL CARCINOMA, SEE COMMENT. - NEGATIVE FOR LYMPH VASCULAR INVASION . - INVASIVE TUMOR IS 0.4 CM FROM THE NEAREST MARGIN (SUPERIOR AND POSTERIOR). - DUCTAL CARCINOMA IN SITU. - CALCIFICATIONS IDENTIFIED. - PREVIOUS BIOPSY SITE IDENTIFIED. - SEE TUMOR SYNOPTIC TEMPLATE BELOW. 1 of 4 FINAL for Melissa Snyder, Melissa Snyder [phone]) Diagnosis(continued) 2. Lymph node, sentinel, biopsy, right axillary - ONE LYMPH NODE, NEGATIVE FOR TUMOR (0/1). 3. Lymph node, sentinel, biopsy - ONE LYMPH NODE, NEGATIVE FOR TUMOR (0/1). Microscopic Comment 1. BREAST, INVASIVE TUMOR, WITH LYMPH NODES PRESENT Specimen, including laterality and lymph node sampling (sentinel, non-sentinel): Right breast with sentinel lymph node sampling. Procedure: Lumpectomy. Histologic type: Ductal. Grade: I of III. Tubule formation: 2 Nuclear pleomorphism: 1 Mitotic: 1 Tumor size (gross measurement): 0.8 cm. Margins: Invasive, distance to closest margin: 0.4 cm. In-situ, distance to closest margin: 0.4 cm (superior and posterior). If margin positive, focally or broadly: N/A. Lymphovascular invasion: Absent. Ductal carcinoma in situ: Present. Grade: I of III. Extensive intraductal component: Absent. Lobular neoplasia: Absent. Tumor focality: Unifocal. Treatment effect: None. If present, treatment effect in breast tissue, lymph nodes or both: N/A. Extent of tumor: Skin: N/A. Nipple: N/A. Skeletal muscle: N/A. Lymph nodes: Examined: 2 Sentinel 0 Non-sentinel 2 Total Lymph nodes with metastasis: 0. Isolated tumor cells (< 0.2 mm):  N/A. Micrometastasis: (> 0.2 mm and < 2.0 mm): N/A. Macrometastasis: (> 2.0 mm): N/A. Extracapsular extension: N/A. Breast prognostic profile: Estrogen receptor: Not repeated, previous study demonstrated 100% positivity ([id number]). Progesterone receptor: Not repeated, previous study demonstrated 35% positivity ([id number]). Her 2 neu: Repeated, previous study demonstrated no amplification ([id number]). Ki-67: Not repeated, previous study demonstrated 8% proliferation rate ([id number]). Non-neoplastic breast:  Previous biopsy site tissue changes, fibroadenomatoid nodules, fibrocytic change, vascular calcifications, and  RADIOGRAPHIC STUDIES: I have personally reviewed the radiological images as listed and agreed with the findings in the report.  Ct Chest Wo Contrast 05/25/2014    IMPRESSION:  1. Persistence and similar size of the dominant left lower lobe sub solid nodule. Adenocarcinoma is considered. Biopsy or resection should be strongly considered. These recommendations are taken from:Recommendations for the Management of Subsolid Pulmonary Nodules Detected at CT: A Statement from the [place], Radiology 2013; 266:1, 304-317.  2. Additional scattered small ground-glass nodules are stable.    Electronically Signed   By: Sherryl Barters M.D.   On: 05/25/2014 11:53   Mr Jeri Cos KD Contrast 06/02/2014    IMPRESSION: 1. Grossly stable appearance of the left planum sphenoidale meningioma measuring 20 x 14 mm on the axial images. 2. The meningioma does extend to the left orbital apex and contacts the left optic nerve. This could be related to the vision loss. 3. Stable posterior meningioma along the scratch that is stable smaller meningioma along the right posterior clinoid. 4. Extensive periventricular and subcortical white matter changes bilaterally are stable. These likely reflect the sequela of chronic microvascular ischemia. 5. Chronic right maxillary sinus disease. 6. Fluid  level in the left sphenoid sinus, compatible with acute disease.   Electronically Signed   By: Lawrence Santiago M.D.   On: 06/02/2014 15:57   Mm Digital Diagnostic Unilat R  05/19/2014   CLINICAL DATA:  Post right breast ultrasound-guided core biopsy.  EXAM: DIAGNOSTIC right breast MAMMOGRAM POST ultrasound BIOPSY  COMPARISON:  Previous exams  FINDINGS: Mammographic images were obtained following ultrasound guided biopsy of the mass located within the right breast at 10 o'clock position 7 cm from the nipple. The heart shaped clip is in appropriate position.  IMPRESSION: Appropriate position of clip following right breast ultrasound-guided core biopsy.  Final Assessment: Post Procedure Mammograms for Marker Placement   Electronically Signed   By: Luberta Robertson M.D.   On: 05/19/2014 14:22   Mm Digital Diagnostic Unilat R  05/19/2014   CLINICAL DATA:  Recall from screening mammogram.  EXAM: DIGITAL DIAGNOSTIC  right breast MAMMOGRAM  ULTRASOUND right BREAST  COMPARISON:  Previous examinations the most recent of which is dated 04/28/2014.  ACR Breast Density Category b: There are scattered areas of fibroglandular density.  FINDINGS: Spot compression views the right breast demonstrate a small irregularly marginated mass to be present within the upper outer quadrant of the right breast at approximately the 10 o'clock position 7 cm from the nipple.  On physical exam, there is no discrete palpable abnormality within the upper outer quadrant of the right breast.  Ultrasound is performed, showing an irregularly marginated, vertically oriented, hypoechoic mass located within the right breast at 10 o'clock position 7 cm from the nipple with shadowing. This measures 7 x 6 x 5 mm in size. This is suspicious for invasive mammary carcinoma and tissue sampling via ultrasound-guided core biopsy is recommended. This will be performed and reported separately.  Ultrasound of the right axilla demonstrates normal axillary contents  and no evidence for adenopathy.  IMPRESSION: 7 mm irregular suspicious mass located within the right breast at the 10 o'clock position 7 cm from the nipple. Tissue sampling is recommended and ultrasound-guided core biopsy will be performed and reported separately.  RECOMMENDATION: Right breast ultrasound-guided core biopsy.  I have discussed the findings and recommendations with the patient. Results were also provided in writing  at the conclusion of the visit. If applicable, a reminder letter will be sent to the patient regarding the next appointment.  BI-RADS CATEGORY  4: Suspicious.   Electronically Signed   By: Luberta Robertson M.D.   On: 05/19/2014 11:58   US Breast Ltd Uni Right Inc Axilla  05/19/2014      IMPRESSION: 7 mm irregular suspicious mass located within the right breast at the 10 o'clock position 7 cm from the nipple. Tissue sampling is recommended and ultrasound-guided core biopsy will be performed and reported separately.  RECOMMENDATION: Right breast ultrasound-guided core biopsy.  I have discussed the findings and recommendations with the patient. Results were also provided in writing at the conclusion of the visit. If applicable, a reminder letter will be sent to the patient regarding the next appointment.  BI-RADS CATEGORY  4: Suspicious.   Electronically Signed   By: Luberta Robertson M.D.   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  1. Right breast invasive ductal carcinoma,  pT1b N0 M0 stage IA, ER positive PR positive and HER-2 negative. -She had lumpectomy with sentinel lymph node biopsy, negative surgical margins.  -Due to her family history of breast cancer in multiple family members, and multiple personal history of cancer, she has been evaluated by our gDietitian Her genetic testing for inherited breast cancer was negative -Given the positive hormone receptors, I recommend adjuvant endocrine therapy with one of the aromatase inhibitor, to reduce her risks of cancer recurrence in the future. The benefit and the potential side effects were discussed with patient, and she agrees to proceed. -Given the early stage of breast cancer, low grade, her advanced age and multiple comorbidities, I do not recommend Oncotype test or adjuvant chemotherapy.  -She will follow-up with Dr. WPablo Ledgerto discuss adjuvant radiation..  2. Pulmonary nodules -I have reviewed her CT chest. The left lower lobe lung nodule is about 8 mm, is a little concerning for low-grade malignancy. She has never smoked, the overall risks of lung cancer is low.  -She is scheduled for repeated scan in March 2016. She has following with pulmonologist Dr. HRoxan Hockeyfor the lung nodules.  3. She'll continue follow-up with her primary care physician and other specialists for her other medical issues.  Plan: -I gave her the prescription of Arimidex 1 mg once daily today. She will start 3-4 weeks after she completes radiation, or in 2 weeks if no radiation. -I will see her back at the end of March for follow-up  All questions were answered. The patient knows to call the clinic with any problems, questions or concerns. I spent 20 minutes counseling the patient face to face. The total time spent in the appointment was 30 minutes and more than 50% was on counseling.     FTruitt Merle MD 08/02/2014 12:50 PM

## 2014-08-04 ENCOUNTER — Telehealth: Payer: Self-pay | Admitting: Hematology

## 2014-08-04 NOTE — Telephone Encounter (Signed)
Left message to confirm appointment in March.

## 2014-08-04 NOTE — Addendum Note (Signed)
Addended by: [name] on: 08/04/2014 09:48 AM   Modules accepted: Orders

## 2014-08-06 ENCOUNTER — Encounter: Payer: Self-pay | Admitting: Oncology

## 2014-08-20 ENCOUNTER — Ambulatory Visit (HOSPITAL_COMMUNITY): Admission: RE | Admit: 2014-08-20 | Payer: Medicare HMO | Source: Ambulatory Visit

## 2014-08-23 ENCOUNTER — Telehealth: Payer: Self-pay | Admitting: Hematology

## 2014-08-23 ENCOUNTER — Encounter: Payer: Self-pay | Admitting: *Deleted

## 2014-08-23 NOTE — Telephone Encounter (Signed)
per pof to r/s pt appt to 4/6-cld pt & left a message of update appt time &date

## 2014-08-24 ENCOUNTER — Ambulatory Visit: Payer: Medicare HMO | Admitting: Thoracic Surgery (Cardiothoracic Vascular Surgery)

## 2014-08-25 ENCOUNTER — Ambulatory Visit: Payer: Medicare HMO

## 2014-08-25 ENCOUNTER — Ambulatory Visit: Payer: Medicare HMO | Admitting: Radiation Oncology

## 2014-08-27 ENCOUNTER — Ambulatory Visit: Payer: Medicare HMO | Admitting: Hematology

## 2014-08-30 ENCOUNTER — Ambulatory Visit (HOSPITAL_COMMUNITY): Payer: Medicare HMO

## 2014-08-31 ENCOUNTER — Ambulatory Visit: Payer: Medicare HMO | Admitting: Thoracic Surgery (Cardiothoracic Vascular Surgery)

## 2014-09-02 ENCOUNTER — Ambulatory Visit
Admission: RE | Admit: 2014-09-02 | Discharge: 2014-09-02 | Disposition: A | Discharge status: Home / Self Care | Payer: Medicare HMO | Source: Ambulatory Visit | Attending: Radiation Oncology | Admitting: Radiation Oncology

## 2014-09-02 ENCOUNTER — Inpatient Hospital Stay: Admission: RE | Admit: 2014-09-02 | Payer: Medicare HMO | Source: Ambulatory Visit | Admitting: Radiation Oncology

## 2014-09-02 DIAGNOSIS — C50412 Malignant neoplasm of upper-outer quadrant of left female breast: Secondary | ICD-10-CM

## 2014-09-02 NOTE — Progress Notes (Signed)
   Department of Radiation Oncology  Phone:  [phone] Fax:        [phone]   Name: Melissa Snyder MRN: [id number]  DOB: Dec 28, 1933  Date: 09/02/2014  Follow Up Visit Note  Diagnosis: Stage I IDC of the right breast  Interval History: [name] presents today for routine followup.  She had her lumpectomy on 2/19. She had a small cancer with negative margins and 2 sentinel nodes were negative. Her margins were clear. She met with Dr. Burr Medico and has decided against AI after thinking about side effects and not wanting to take another pill. She is here with her husband. She has not had her PET scan or seen Dr. Roxan Hockey again.   Physical Exam:  There were no vitals filed for this visit. Pleasant female in no distress. Incision is well healed.   IMPRESSION: Melissa Snyder is a 81 y.o. female s/p lumpectomy for a stage 1 cancer.   PLAN:  I spoke to the patient today regarding her diagnosis and options for treatment. We discussed the role of radiation in decreasing local failures in patients who undergo lumpectomy. At her age, I would recommend at least one further treatment either radiation or hormones but likely not both unless she desires both. We discussed the process of simulation and the placement tattoos. We discussed 3 weeks of treatment as an outpatient. We discussed the possibility of asymptomatic lung damage. We discussed the low likelihood of secondary malignancies. We discussed the possible side effects including but not limited to skin redness, fatigue, permanent skin darkening, and breast swelling. She has elected to proceed forward with radiation and is not interested in AI. She asked me to cancel her appointment with Dr. Burr Medico and I will do so. We will send her for follow up with survivorship or myself and Dr. Lucia Gaskins.   She will follow up with Dr. Roxan Hockey for her lung nodules.   I have scheduled her for simulation next week. She has signed informed consent.     [name],  MD

## 2014-09-02 NOTE — Progress Notes (Signed)
Location of Breast Cancer:Right Breast invasive ductal carcinoma  Grade II of upper-outer quadrant.  Histology per Pathology Report: 07/23/14 Diagnosis 1. Breast, lumpectomy, right - INVASIVE DUCTAL CARCINOMA, SEE COMMENT. - NEGATIVE FOR LYMPH VASCULAR INVASION . - INVASIVE TUMOR IS 0.4 CM FROM THE NEAREST MARGIN (SUPERIOR AND POSTERIOR). - DUCTAL CARCINOMA IN SITU. - CALCIFICATIONS IDENTIFIED. - PREVIOUS BIOPSY SITE IDENTIFIED. - SEE TUMOR SYNOPTIC TEMPLATE BELOW. 1 of 4 FINAL for MCKALA, [place] [phone]) Diagnosis(continued) 2. Lymph node, sentinel, biopsy, right axillary - ONE LYMPH NODE, NEGATIVE FOR TUMOR (0/1). 3. Lymph node, sentinel, biopsy - ONE LYMPH NODE, NEGATIVE FOR TUMOR (0/1).  Receptor Status: ER(+), PR (+), Her2-neu (-)  Did patient present with symptoms (if so, please note symptoms) or was this found on screening mammography?:   Past/Anticipated interventions by surgeon, if any:07/23/14 BREAST LUMPECTOMY WITH RADIOACTIVE SEED AND SENTINEL LYMPH NODE BIOPSY  Past/Anticipated interventions by medical oncology, if any: Chemotherapy;Given prescription for arimidex to start after completion of radiation.follow up with Dr.Feng  Lymphedema issues, if any: No   Pain issues, if any: Soreness above incision site  SAFETY ISSUES:  Prior radiation? Gamma knife August 2015 with Dr.Tanner  Pacemaker/ICD?   Possible current prNoegnancy?No  Is the patient on methotrexate?No   Current Complaints / other details: Watchful waiting of lung nodules.Scan scheduled for March 2016 was denied by insurance. Blind in left eye.left orbit meningioma is stable.Brain mri 06/02/14.    Arlyss Repress, RN 09/02/2014,10:51 AM

## 2014-09-02 NOTE — Progress Notes (Signed)
Please see the Nurse Progress Note in the MD Initial Consult Encounter for this patient. 

## 2014-09-03 ENCOUNTER — Telehealth: Payer: Self-pay

## 2014-09-03 ENCOUNTER — Ambulatory Visit: Payer: Medicare HMO

## 2014-09-03 ENCOUNTER — Ambulatory Visit: Payer: Medicare HMO | Admitting: Radiation Oncology

## 2014-09-03 NOTE — Telephone Encounter (Signed)
Pt called to cancel her appt with Dr Burr Medico on 4/6. She has decided not to take oral chemo but she will be getting radiation therapy with Dr Pablo Ledger. Routed to Dr Burr Medico and RN before the appt cancelled in case Dr Burr Medico needs to speak with pt personally.

## 2014-09-06 ENCOUNTER — Other Ambulatory Visit: Payer: Self-pay | Admitting: Radiation Oncology

## 2014-09-07 ENCOUNTER — Ambulatory Visit
Admission: RE | Admit: 2014-09-07 | Discharge: 2014-09-07 | Disposition: A | Discharge status: Home / Self Care | Payer: Medicare HMO | Source: Ambulatory Visit | Attending: Radiation Oncology | Admitting: Radiation Oncology

## 2014-09-07 DIAGNOSIS — C50412 Malignant neoplasm of upper-outer quadrant of left female breast: Secondary | ICD-10-CM | POA: Diagnosis not present

## 2014-09-07 NOTE — Progress Notes (Signed)
Radiation Oncology         [phone] ________________________________  Name: Melissa Snyder      MRN: [id number]          Date: 09/07/2014              DOB: 07-Dec-1933  Optical Surface Tracking Plan:  Since intensity modulated radiotherapy (IMRT) and 3D conformal radiation treatment methods are predicated on accurate and precise positioning for treatment, intrafraction motion monitoring is medically necessary to ensure accurate and safe treatment delivery.  The ability to quantify intrafraction motion without excessive ionizing radiation dose can only be performed with optical surface tracking. Accordingly, surface imaging offers the opportunity to obtain 3D measurements of patient position throughout IMRT and 3D treatments without excessive radiation exposure.  I am ordering optical surface tracking for this patient's upcoming course of radiotherapy. ________________________________ Signature   Reference:   Ursula Alert, J, et al. Surface imaging-based analysis of intrafraction motion for breast radiotherapy patients.Journal of [employer], n. 6, nov. 2014. ISSN 15400867.   Available at: <http://www.jacmp.org/index.php/jacmp/article/view/4957>.

## 2014-09-07 NOTE — Progress Notes (Signed)
Name: Melissa Snyder   MRN: [id number]  Date:  09/07/2014  DOB: 05-12-1934  Status:outpatient    DIAGNOSIS: Breast cancer.  CONSENT VERIFIED: yes   SET UP: Patient is setup supine   IMMOBILIZATION:  The following immobilization was used:Custom Moldable Pillow, breast board.   NARRATIVE: [name] was brought to the [facility].  Identity was confirmed.  All relevant records and images related to the planned course of therapy were reviewed.  Then, the patient was positioned in a stable reproducible clinical set-up for radiation therapy.  Wires were placed to delineate the clinical extent of breast tissue. A wire was placed on the scar as well.  CT images were obtained.  An isocenter was placed. Skin markings were placed.  The CT images were loaded into the planning software where the target and avoidance structures were contoured.  The radiation prescription was entered and confirmed. The patient was discharged in stable condition and tolerated simulation well.    TREATMENT PLANNING NOTE:  Treatment planning then occurred. I have requested : MLC's, isodose plan, basic dose calculation  I personally designed and supervised the construction of 3 medically necessary complex treatment devices for the protection of critical normal structures including the lungs and contralateral breast as well as the immobilization device which is necessary for set up certainty.   3D simulation occurred. I requested and analyzed a dose volume histogram of the heart, lungs and lumpectomy cavity.

## 2014-09-08 ENCOUNTER — Ambulatory Visit: Payer: Medicare HMO | Admitting: Hematology

## 2014-09-14 ENCOUNTER — Ambulatory Visit: Payer: Medicare HMO | Admitting: Radiation Oncology

## 2014-09-14 DIAGNOSIS — C50412 Malignant neoplasm of upper-outer quadrant of left female breast: Secondary | ICD-10-CM | POA: Diagnosis not present

## 2014-09-15 ENCOUNTER — Ambulatory Visit: Payer: Medicare HMO

## 2014-09-15 ENCOUNTER — Ambulatory Visit
Admission: RE | Admit: 2014-09-15 | Discharge: 2014-09-15 | Disposition: A | Discharge status: Home / Self Care | Payer: Medicare HMO | Source: Ambulatory Visit | Attending: Radiation Oncology | Admitting: Radiation Oncology

## 2014-09-15 DIAGNOSIS — C50412 Malignant neoplasm of upper-outer quadrant of left female breast: Secondary | ICD-10-CM | POA: Diagnosis not present

## 2014-09-16 ENCOUNTER — Ambulatory Visit: Payer: Medicare HMO

## 2014-09-16 ENCOUNTER — Ambulatory Visit
Admission: RE | Admit: 2014-09-16 | Discharge: 2014-09-16 | Disposition: A | Discharge status: Home / Self Care | Payer: Medicare HMO | Source: Ambulatory Visit | Attending: Radiation Oncology | Admitting: Radiation Oncology

## 2014-09-16 DIAGNOSIS — C50412 Malignant neoplasm of upper-outer quadrant of left female breast: Secondary | ICD-10-CM | POA: Diagnosis not present

## 2014-09-17 ENCOUNTER — Ambulatory Visit
Admission: RE | Admit: 2014-09-17 | Discharge: 2014-09-17 | Disposition: A | Discharge status: Home / Self Care | Payer: Medicare HMO | Source: Ambulatory Visit | Attending: Radiation Oncology | Admitting: Radiation Oncology

## 2014-09-17 ENCOUNTER — Ambulatory Visit: Payer: Medicare HMO

## 2014-09-17 DIAGNOSIS — C50412 Malignant neoplasm of upper-outer quadrant of left female breast: Secondary | ICD-10-CM | POA: Diagnosis not present

## 2014-09-20 ENCOUNTER — Ambulatory Visit: Payer: Medicare HMO

## 2014-09-20 ENCOUNTER — Ambulatory Visit
Admission: RE | Admit: 2014-09-20 | Discharge: 2014-09-20 | Disposition: A | Discharge status: Home / Self Care | Payer: Medicare HMO | Source: Ambulatory Visit | Attending: Radiation Oncology | Admitting: Radiation Oncology

## 2014-09-20 DIAGNOSIS — C50412 Malignant neoplasm of upper-outer quadrant of left female breast: Secondary | ICD-10-CM | POA: Diagnosis not present

## 2014-09-21 ENCOUNTER — Ambulatory Visit: Payer: Medicare HMO

## 2014-09-21 ENCOUNTER — Ambulatory Visit
Admission: RE | Admit: 2014-09-21 | Discharge: 2014-09-21 | Disposition: A | Discharge status: Home / Self Care | Payer: Medicare HMO | Source: Ambulatory Visit | Attending: Radiation Oncology | Admitting: Radiation Oncology

## 2014-09-21 VITALS — BP 148/63 | HR 69 | Temp 97.7°F | Wt 142.6 lb

## 2014-09-21 DIAGNOSIS — C50412 Malignant neoplasm of upper-outer quadrant of left female breast: Secondary | ICD-10-CM | POA: Diagnosis not present

## 2014-09-21 DIAGNOSIS — C50911 Malignant neoplasm of unspecified site of right female breast: Secondary | ICD-10-CM

## 2014-09-21 MED ORDER — RADIAPLEXRX EX GEL
Freq: Once | CUTANEOUS | Status: AC
  Administered 2014-09-21: 12:00:00 via TOPICAL

## 2014-09-21 MED ORDER — ALRA NON-METALLIC DEODORANT (RAD-ONC)
1.0000 "application " | Freq: Once | TOPICAL | Status: AC
  Administered 2014-09-21: 1 via TOPICAL

## 2014-09-21 NOTE — Progress Notes (Signed)
Weekly Management Note Current Dose: 10.68  Gy  Projected Dose: 52.72 Gy   Narrative:  The patient presents for routine under treatment assessment.  CBCT/MVCT images/Port film x-rays were reviewed.  The chart was checked. Doing well. No complaints.   Physical Findings: Weight: 142 lb 9.6 oz (64.683 kg). Unchanged  Impression:  The patient is tolerating radiation.  Plan:  Continue treatment as planned.

## 2014-09-22 ENCOUNTER — Ambulatory Visit
Admission: RE | Admit: 2014-09-22 | Discharge: 2014-09-22 | Disposition: A | Discharge status: Home / Self Care | Payer: Medicare HMO | Source: Ambulatory Visit | Attending: Radiation Oncology | Admitting: Radiation Oncology

## 2014-09-22 ENCOUNTER — Ambulatory Visit: Payer: Medicare HMO

## 2014-09-22 DIAGNOSIS — C50412 Malignant neoplasm of upper-outer quadrant of left female breast: Secondary | ICD-10-CM | POA: Diagnosis not present

## 2014-09-23 ENCOUNTER — Ambulatory Visit: Payer: Medicare HMO

## 2014-09-23 ENCOUNTER — Ambulatory Visit
Admission: RE | Admit: 2014-09-23 | Discharge: 2014-09-23 | Disposition: A | Discharge status: Home / Self Care | Payer: Medicare HMO | Source: Ambulatory Visit | Attending: Radiation Oncology | Admitting: Radiation Oncology

## 2014-09-23 ENCOUNTER — Other Ambulatory Visit: Payer: Self-pay | Admitting: *Deleted

## 2014-09-23 DIAGNOSIS — C50412 Malignant neoplasm of upper-outer quadrant of left female breast: Secondary | ICD-10-CM | POA: Diagnosis not present

## 2014-09-23 DIAGNOSIS — R911 Solitary pulmonary nodule: Secondary | ICD-10-CM

## 2014-09-24 ENCOUNTER — Ambulatory Visit
Admission: RE | Admit: 2014-09-24 | Discharge: 2014-09-24 | Disposition: A | Discharge status: Home / Self Care | Payer: Medicare HMO | Source: Ambulatory Visit | Attending: Radiation Oncology | Admitting: Radiation Oncology

## 2014-09-24 ENCOUNTER — Ambulatory Visit: Payer: Medicare HMO

## 2014-09-24 DIAGNOSIS — C50412 Malignant neoplasm of upper-outer quadrant of left female breast: Secondary | ICD-10-CM | POA: Diagnosis not present

## 2014-09-27 ENCOUNTER — Ambulatory Visit
Admission: RE | Admit: 2014-09-27 | Discharge: 2014-09-27 | Disposition: A | Discharge status: Home / Self Care | Payer: Medicare HMO | Source: Ambulatory Visit | Attending: Thoracic Surgery (Cardiothoracic Vascular Surgery) | Admitting: Thoracic Surgery (Cardiothoracic Vascular Surgery)

## 2014-09-27 ENCOUNTER — Ambulatory Visit (INDEPENDENT_AMBULATORY_CARE_PROVIDER_SITE_OTHER): Payer: Medicare HMO | Admitting: Thoracic Surgery (Cardiothoracic Vascular Surgery)

## 2014-09-27 ENCOUNTER — Ambulatory Visit
Admission: RE | Admit: 2014-09-27 | Discharge: 2014-09-27 | Disposition: A | Discharge status: Home / Self Care | Payer: Medicare HMO | Source: Ambulatory Visit | Attending: Radiation Oncology | Admitting: Radiation Oncology

## 2014-09-27 ENCOUNTER — Ambulatory Visit: Payer: Medicare HMO

## 2014-09-27 ENCOUNTER — Encounter: Payer: Self-pay | Admitting: Thoracic Surgery (Cardiothoracic Vascular Surgery)

## 2014-09-27 VITALS — BP 180/90 | HR 72 | Resp 16 | Ht 66.0 in | Wt 143.0 lb

## 2014-09-27 DIAGNOSIS — R599 Enlarged lymph nodes, unspecified: Secondary | ICD-10-CM | POA: Diagnosis not present

## 2014-09-27 DIAGNOSIS — C50412 Malignant neoplasm of upper-outer quadrant of left female breast: Secondary | ICD-10-CM | POA: Diagnosis not present

## 2014-09-27 DIAGNOSIS — R590 Localized enlarged lymph nodes: Secondary | ICD-10-CM

## 2014-09-27 DIAGNOSIS — R918 Other nonspecific abnormal finding of lung field: Secondary | ICD-10-CM

## 2014-09-27 DIAGNOSIS — R911 Solitary pulmonary nodule: Secondary | ICD-10-CM

## 2014-09-27 NOTE — Progress Notes (Signed)
[address]       [place] [address]             [phone]       HPI:  Melissa Snyder returns for follow up of her multiple lung nodules.  She is a 81 year old woman who was in her usual state of health until May of 2015, when she presented with blindness of her left eye. This initially was thought to be stroke, but turned out to be due to a meningioma.   As part of her workup she had a chest x-ray which led to a CT of the chest. She was noted to have multiple groundglass nodules, the largest being 13 mm in the superior segment of the left lower lobe. A repeat CT of the chest was followed with a PET CT. The SUV of the left lower lobe nodule was only 1.1. There was a subcarinal node that was not enlarged but was hypermetabolic with an SUV of 5.8.  She had surgery for melanoma on her left cheek near the end of last year. She is currently receiving postop radiation for stage I IDC of the right breast. She had a lumpectomy in February. She will finish radiation the middle of next week.  She is a lifelong nonsmoker. She has no known asbestos exposure. She has had a nonproductive cough, this bothers her mostly at night. She denies hemoptysis. She has not had any issues with loss of appetite and denies weight loss.   Past Medical History  Diagnosis Date  . Chest pain   . Bundle branch block left   . Migraine   . Hypothyroid   . Hypertension   . Arthritis     OA  . Hyperlipidemia   . GERD (gastroesophageal reflux disease)   . Melanoma in situ of face JUNE 2015    LEFT CHEEK  . Breast cancer 05/2014    ER+/PR+/Her2-  . Family history of breast cancer   . Family history of bladder cancer   . Family history of prostate cancer   . 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 CAD Mother     CABG  . Hyperlipidemia Mother   . AAA (abdominal aortic aneurysm) Mother   . Renal Disease Father   . Kidney disease Father   . CAD  Father   . Cancer Father     PROSTATE  . Diabetes Brother   . Hyperlipidemia Brother   . Breast cancer Daughter 18  . Breast cancer Sister 47  . Breast cancer Sister 70  . Cancer Brother     NOS  . Bladder Cancer Brother 84  . Breast cancer Cousin     five paternal first cousins  . Lung cancer Cousin     3 paternal cousins with lung cancer  . Cancer Cousin     4 paternal first cousins with Cancer NOS  . Cancer Cousin     1 paternal cousin with oral cancer (tongue)   History   Social History  . Marital Status: Married    Spouse Name: N/A  . Number of Children: 4  . Years of Education: N/A   Occupational History  . Not on file.   Social History Main Topics  . Smoking status: Never Smoker   . Smokeless tobacco: Never Used  . Alcohol Use: No  . Drug Use: No  . Sexual Activity:  Not on file   Other Topics Concern  . Not on file   Social History Narrative     Current Outpatient Prescriptions  Medication Sig Dispense Refill  . aspirin 81 MG chewable tablet Chew 1 tablet (81 mg total) by mouth daily. 30 tablet 0  . atenolol (TENORMIN) 50 MG tablet Take 50 mg by mouth at bedtime.     Marland Kitchen b complex vitamins capsule Take 1 capsule by mouth daily.    . calcium-vitamin D (OSCAL WITH D) 500-200 MG-UNIT per tablet Take 1 tablet by mouth every morning.    . Digestive Enzymes (DIGESTIVE ENZYME PO) Take 1 capsule by mouth as needed.    . fish oil-omega-3 fatty acids 1000 MG capsule Take 1 g by mouth every morning.     Marland Kitchen levothyroxine (SYNTHROID, LEVOTHROID) 112 MCG tablet Take 112 mcg by mouth every morning.     . Multiple Vitamin (MULTIVITAMIN) tablet Take 1 tablet by mouth every morning.     . Multiple Vitamins-Minerals (VISION FORMULA PO) Take 1 capsule by mouth daily.    . vitamin E (E-400) 400 UNIT capsule Take 400 Units by mouth daily.    Marland Kitchen anastrozole (ARIMIDEX) 1 MG tablet Take 1 tablet (1 mg total) by mouth daily. (Patient not taking: Reported on 09/02/2014) 30 tablet 5    . HYDROcodone-acetaminophen (NORCO/VICODIN) 5-325 MG per tablet Take 1-2 tablets by mouth every 6 (six) hours as needed. (Patient not taking: Reported on 09/02/2014) 30 tablet 0   No current facility-administered medications for this visit.    Physical Exam BP 180/90 mmHg  Pulse 72  Resp 16  Ht '5\' 6"'  (1.676 m)  Wt 143 lb (64.864 kg)  BMI 23.09 kg/m2  SpO2 98% Elderly woman in NAD Well developed and well nourished Alert and oriented x 3 No cervical or supraclavicular adenopathy Lungs clear Cardiac RRR, no murmur  Diagnostic Tests: CT chest reviewed. It shows no change in overall size of LLL nodule but there has been an increase in the size of the solid component.  Impression: 81 yo woman with a 1.5 cm GGO in the left lower lobe. This nodule has developed a slowly enlarging solid component on serial CT scans despite no change in the overall size. There are multiple other GGO scattered throughout both lungs that have not changed.  This is suspicious for a low grade adenocarcinoma.  Of not the subcarinal nodes which were hypermetabolic on PET are not enlarged on CT even now.  I had a long discussion with Melissa Snyder and her husband regarding diagnosis and treatment. We discussed various ways to make a diagnosis including ENB, CT guided biopsy or wedge resection. We reviewed the advantages, disadvantages and some of the risks associated with each.  In her case I think it comes down to CT guided biopsy v VATS, wedge/ segmentectomy. After discussing the pros and cons of each she wishes to have it resected understanding that it involves a major operation with significant risks.  I will meet with her again after she completes radiation to determine a date for surgery  Plan: Return in 3 weeks   [name], MD [facility] [phone]  I spent over 25 minutes with Melissa Snyder

## 2014-09-28 ENCOUNTER — Other Ambulatory Visit: Payer: Self-pay | Admitting: *Deleted

## 2014-09-28 ENCOUNTER — Ambulatory Visit: Payer: Medicare HMO | Admitting: Thoracic Surgery (Cardiothoracic Vascular Surgery)

## 2014-09-28 ENCOUNTER — Ambulatory Visit
Admission: RE | Admit: 2014-09-28 | Discharge: 2014-09-28 | Disposition: A | Discharge status: Home / Self Care | Payer: Medicare HMO | Source: Ambulatory Visit | Attending: Radiation Oncology | Admitting: Radiation Oncology

## 2014-09-28 ENCOUNTER — Encounter: Payer: Self-pay | Admitting: Radiation Oncology

## 2014-09-28 ENCOUNTER — Ambulatory Visit: Payer: Medicare HMO

## 2014-09-28 VITALS — BP 166/64 | HR 74 | Temp 97.3°F | Resp 20 | Wt 142.9 lb

## 2014-09-28 DIAGNOSIS — C50412 Malignant neoplasm of upper-outer quadrant of left female breast: Secondary | ICD-10-CM

## 2014-09-28 DIAGNOSIS — R911 Solitary pulmonary nodule: Secondary | ICD-10-CM

## 2014-09-28 NOTE — Progress Notes (Signed)
Weekly Management Note Current Dose: 24.03  Gy  Projected Dose: 42.72 Gy   Narrative:  The patient presents for routine under treatment assessment.  CBCT/MVCT images/Port film x-rays were reviewed.  The chart was checked. Doing well. Met with Dr. Roxan Hockey and discussed wedge resection. No breast related complaints.   Physical Findings: Weight: 142 lb 14.4 oz (64.819 kg). Unchanged  Impression:  The patient is tolerating radiation.  Plan:  Continue treatment as planned. Continue radiaplex.

## 2014-09-28 NOTE — Progress Notes (Signed)
Weekly rad txs rt breast no skin changes,using radiaplex bid, no pain 10:23 AM BP 166/64 mmHg  Pulse 74  Temp(Src) 97.3 F (36.3 C) (Oral)  Resp 20  Wt 142 lb 14.4 oz (64.819 kg)  Wt Readings from Last 3 Encounters:  09/27/14 143 lb (64.864 kg)  08/02/14 142 lb 1.6 oz (64.456 kg)  07/23/14 142 lb 3.2 oz (64.501 kg)

## 2014-09-29 ENCOUNTER — Ambulatory Visit: Payer: Medicare HMO

## 2014-09-29 ENCOUNTER — Ambulatory Visit
Admission: RE | Admit: 2014-09-29 | Discharge: 2014-09-29 | Disposition: A | Discharge status: Home / Self Care | Payer: Medicare HMO | Source: Ambulatory Visit | Attending: Radiation Oncology | Admitting: Radiation Oncology

## 2014-09-29 DIAGNOSIS — C50412 Malignant neoplasm of upper-outer quadrant of left female breast: Secondary | ICD-10-CM | POA: Diagnosis not present

## 2014-09-30 ENCOUNTER — Ambulatory Visit: Payer: Medicare HMO

## 2014-09-30 ENCOUNTER — Ambulatory Visit
Admission: RE | Admit: 2014-09-30 | Discharge: 2014-09-30 | Disposition: A | Discharge status: Home / Self Care | Payer: Medicare HMO | Source: Ambulatory Visit | Attending: Radiation Oncology | Admitting: Radiation Oncology

## 2014-09-30 DIAGNOSIS — C50412 Malignant neoplasm of upper-outer quadrant of left female breast: Secondary | ICD-10-CM | POA: Diagnosis not present

## 2014-10-01 ENCOUNTER — Ambulatory Visit: Payer: Medicare HMO

## 2014-10-01 ENCOUNTER — Ambulatory Visit
Admission: RE | Admit: 2014-10-01 | Discharge: 2014-10-01 | Disposition: A | Discharge status: Home / Self Care | Payer: Medicare HMO | Source: Ambulatory Visit | Attending: Radiation Oncology | Admitting: Radiation Oncology

## 2014-10-01 DIAGNOSIS — C50412 Malignant neoplasm of upper-outer quadrant of left female breast: Secondary | ICD-10-CM | POA: Diagnosis not present

## 2014-10-04 ENCOUNTER — Ambulatory Visit
Admission: RE | Admit: 2014-10-04 | Discharge: 2014-10-04 | Disposition: A | Discharge status: Home / Self Care | Payer: Medicare HMO | Source: Ambulatory Visit | Attending: Radiation Oncology | Admitting: Radiation Oncology

## 2014-10-04 ENCOUNTER — Ambulatory Visit: Payer: Medicare HMO

## 2014-10-04 DIAGNOSIS — C50412 Malignant neoplasm of upper-outer quadrant of left female breast: Secondary | ICD-10-CM | POA: Diagnosis not present

## 2014-10-05 ENCOUNTER — Ambulatory Visit
Admission: RE | Admit: 2014-10-05 | Discharge: 2014-10-05 | Disposition: A | Discharge status: Home / Self Care | Payer: Medicare HMO | Source: Ambulatory Visit | Attending: Radiation Oncology | Admitting: Radiation Oncology

## 2014-10-05 ENCOUNTER — Ambulatory Visit: Payer: Medicare HMO

## 2014-10-05 ENCOUNTER — Encounter: Payer: Self-pay | Admitting: Radiation Oncology

## 2014-10-05 VITALS — BP 166/71 | HR 68 | Temp 97.6°F | Resp 20 | Wt 143.2 lb

## 2014-10-05 DIAGNOSIS — C50412 Malignant neoplasm of upper-outer quadrant of left female breast: Secondary | ICD-10-CM

## 2014-10-05 NOTE — Progress Notes (Addendum)
Weekly rad txs right breast 14/16 completed, mild pink color  on breast ,intact, uses radiaplex gel bid, throbbing under right axilla at times, appetite good 10:27 AM BP 166/71 mmHg  Pulse 68  Temp(Src) 97.6 F (36.4 C) (Oral)  Resp 20  Wt 143 lb 3.2 oz (64.955 kg)  Wt Readings from Last 3 Encounters:  09/27/14 143 lb (64.864 kg)  08/02/14 142 lb 1.6 oz (64.456 kg)  07/23/14 142 lb 3.2 oz (64.501 kg)

## 2014-10-05 NOTE — Progress Notes (Signed)
Weekly Management Note Current Dose: 37.38  Gy  Projected Dose: 42.72 Gy   Narrative:  The patient presents for routine under treatment assessment.  CBCT/MVCT images/Port film x-rays were reviewed.  The chart was checked. Doing well. Some pain under right arm occasionally.   Physical Findings: Weight: 143 lb 3.2 oz (64.955 kg). Unchanged. Light pink skin.   Impression:  The patient is tolerating radiation.  Plan:  Continue treatment as planned. Follow up in 1 month. Follow up with medical oncology and Dr. Roxan Hockey.

## 2014-10-06 ENCOUNTER — Encounter (INDEPENDENT_AMBULATORY_CARE_PROVIDER_SITE_OTHER): Payer: Self-pay

## 2014-10-06 ENCOUNTER — Ambulatory Visit: Payer: Medicare HMO

## 2014-10-06 ENCOUNTER — Ambulatory Visit (HOSPITAL_COMMUNITY)
Admission: RE | Admit: 2014-10-06 | Discharge: 2014-10-06 | Disposition: A | Discharge status: Home / Self Care | Payer: Medicare HMO | Source: Ambulatory Visit | Attending: Thoracic Surgery (Cardiothoracic Vascular Surgery) | Admitting: Thoracic Surgery (Cardiothoracic Vascular Surgery)

## 2014-10-06 ENCOUNTER — Ambulatory Visit
Admission: RE | Admit: 2014-10-06 | Discharge: 2014-10-06 | Disposition: A | Discharge status: Home / Self Care | Payer: Medicare HMO | Source: Ambulatory Visit | Attending: Radiation Oncology | Admitting: Radiation Oncology

## 2014-10-06 DIAGNOSIS — R911 Solitary pulmonary nodule: Secondary | ICD-10-CM | POA: Diagnosis not present

## 2014-10-06 DIAGNOSIS — C50412 Malignant neoplasm of upper-outer quadrant of left female breast: Secondary | ICD-10-CM | POA: Diagnosis not present

## 2014-10-06 LAB — PULMONARY FUNCTION TEST
DL/VA % PRED: 70 %
DL/VA: 3.57 ml/min/mmHg/L
DLCO UNC % PRED: 68 %
DLCO UNC: 18.39 ml/min/mmHg
FEF 25-75 POST: 1.58 L/s
FEF 25-75 PRE: 1.79 L/s
FEF2575-%Change-Post: -11 %
FEF2575-%Pred-Post: 101 %
FEF2575-%Pred-Pre: 114 %
FEV1-%Change-Post: 2 %
FEV1-%PRED-POST: 105 %
FEV1-%PRED-PRE: 102 %
FEV1-PRE: 2.22 L
FEV1-Post: 2.27 L
FEV1FVC-%Change-Post: 2 %
FEV1FVC-%Pred-Pre: 99 %
FEV6-%Change-Post: 0 %
FEV6-%Pred-Post: 109 %
FEV6-%Pred-Pre: 108 %
FEV6-Post: 3 L
FEV6-Pre: 2.98 L
FEV6FVC-%CHANGE-POST: 0 %
FEV6FVC-%PRED-POST: 104 %
FEV6FVC-%Pred-Pre: 105 %
FVC-%CHANGE-POST: 0 %
FVC-%PRED-PRE: 104 %
FVC-%Pred-Post: 104 %
FVC-Post: 3.02 L
FVC-Pre: 3.03 L
POST FEV6/FVC RATIO: 99 %
Post FEV1/FVC ratio: 75 %
Pre FEV1/FVC ratio: 73 %
Pre FEV6/FVC Ratio: 100 %
RV % pred: 106 %
RV: 2.64 L
TLC % PRED: 102 %
TLC: 5.47 L

## 2014-10-06 MED ORDER — ALBUTEROL SULFATE (2.5 MG/3ML) 0.083% IN NEBU
2.5000 mg | INHALATION_SOLUTION | Freq: Once | RESPIRATORY_TRACT | Status: AC
  Administered 2014-10-06: 2.5 mg via RESPIRATORY_TRACT

## 2014-10-07 ENCOUNTER — Ambulatory Visit
Admission: RE | Admit: 2014-10-07 | Discharge: 2014-10-07 | Disposition: A | Discharge status: Home / Self Care | Payer: Medicare HMO | Source: Ambulatory Visit | Attending: Radiation Oncology | Admitting: Radiation Oncology

## 2014-10-07 DIAGNOSIS — C50412 Malignant neoplasm of upper-outer quadrant of left female breast: Secondary | ICD-10-CM | POA: Diagnosis not present

## 2014-10-11 ENCOUNTER — Encounter: Payer: Self-pay | Admitting: Radiation Oncology

## 2014-10-11 NOTE — Progress Notes (Signed)
  Radiation Oncology         [phone] ________________________________  Name: Melissa Snyder MRN: [id number]  Date: 10/07/2014  DOB: February 05, 1934  End of Treatment Note  Diagnosis:  Stage I right breast cancer  Indication for treatment:  Curative    Radiation treatment dates:   09/16/2014-10/07/2014  Site/dose:   Right breast/ 42.72 Gy at 2.67 Gy per fraction x 21 fractions.   Beams/energy:  Opposed tangents with reduced fields / 6 MV photons  Narrative: The patient tolerated radiation treatment relatively well.   She had minimal skin changes.   Plan: The patient has completed radiation treatment. The patient will return to radiation oncology clinic for routine followup in one month. I advised them to call or return sooner if they have any questions or concerns related to their recovery or treatment. She will see Dr. Roxan Hockey in the interim for discussion of her lung surgery.   ------------------------------------------------  [name], MD

## 2014-10-14 ENCOUNTER — Telehealth: Payer: Self-pay

## 2014-10-14 NOTE — Telephone Encounter (Signed)
Patient called to inquire about a change around right side of nipple.Noticed "puckering/indentation and increased sensitivity to touch.Spoke with Dr.Wentworth and informed patient this is normal occurrence post radiation.Patient encouraged to call me next week if it worsens and we will arrange for follow up if needed.Patient thankful for information.

## 2014-10-19 ENCOUNTER — Encounter: Payer: Self-pay | Admitting: Thoracic Surgery (Cardiothoracic Vascular Surgery)

## 2014-10-19 ENCOUNTER — Ambulatory Visit (INDEPENDENT_AMBULATORY_CARE_PROVIDER_SITE_OTHER): Payer: Medicare HMO | Admitting: Thoracic Surgery (Cardiothoracic Vascular Surgery)

## 2014-10-19 VITALS — BP 181/83 | HR 68 | Resp 16 | Ht 66.0 in | Wt 142.5 lb

## 2014-10-19 DIAGNOSIS — Z5189 Encounter for other specified aftercare: Secondary | ICD-10-CM

## 2014-10-19 DIAGNOSIS — R918 Other nonspecific abnormal finding of lung field: Secondary | ICD-10-CM

## 2014-10-19 DIAGNOSIS — Z923 Personal history of irradiation: Secondary | ICD-10-CM

## 2014-10-19 NOTE — Progress Notes (Signed)
[address]       [place] [address]             [phone]       HPI:  Mrs. Gebert returns today for continued discussion regarding diagnosis and treatment of her multiple bilateral lung nodules.  She is a 81 year old woman with past medical history significant for breast cancer, melanoma, hypertension, hyperlipidemia, left bundle branch block, and blindness in her left eye due to a meningioma.  The lung nodules were initially noted on a chest x-ray which was done when she was being worked up for meningioma. That led to a CT of the chest in June 2015. She had a PET/CT in follow-up in September. The largest of the groundglass opacities had an SUV of only 1.1. Of note she had a subcarinal node that was hypermetabolic with an SUV of 5.8 although it was not enlarged on scan. She has been followed with scans every 3 months since then. Most recently a scan in April showed that the solid component of the mixed nodule in the superior segment of the left lower lobe was more prominent although the nodule overall had not increased in size. This was concerning for invasive cancer. We discussed the options for diagnosis and treatment. She was interested in surgical resection, but wanted to wait until after she had finished her radiation therapy for breast cancer, which is now complete.  She is a lifelong smoker. She has a nonproductive cough mostly at night. She feels poorly today complaining of back pain after sitting in one position for a long time yesterday cutting up strawberries.   Past Medical History  Diagnosis Date  . Chest pain   . Bundle branch block left   . Migraine   . Hypothyroid   . Hypertension   . Arthritis     OA  . Hyperlipidemia   . GERD (gastroesophageal reflux disease)   . Melanoma in situ of face JUNE 2015    LEFT CHEEK  . Breast cancer 05/2014    ER+/PR+/Her2-  . Family history of breast cancer   . Family history of bladder cancer   . Family  history of prostate cancer   . 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 aspirin 81 MG chewable tablet Chew 1 tablet (81 mg total) by mouth daily. 30 tablet 0  . atenolol (TENORMIN) 50 MG tablet Take 50 mg by mouth at bedtime.     Marland Kitchen b complex vitamins capsule Take 1 capsule by mouth daily.    . calcium-vitamin D (OSCAL WITH D) 500-200 MG-UNIT per tablet Take 1 tablet by mouth every morning.    . Digestive Enzymes (DIGESTIVE ENZYME PO) Take 1 capsule by mouth as needed.    . fish oil-omega-3 fatty acids 1000 MG capsule Take 1 g by mouth every morning.     Marland Kitchen levothyroxine (SYNTHROID, LEVOTHROID) 112 MCG tablet Take 112 mcg by mouth every morning.     . Multiple Vitamin (MULTIVITAMIN) tablet Take 1 tablet by mouth every morning.     . Multiple Vitamins-Minerals (VISION FORMULA PO) Take 1 capsule by mouth daily.    . vitamin E (E-400) 400 UNIT capsule Take 400 Units by mouth daily.     No current facility-administered medications for this visit.    Physical Exam BP 181/83 mmHg  Pulse 68  Resp 16  Ht '5\' 6"'  (1.676  m)  Wt 142 lb 8 oz (64.638 kg)  BMI 23.01 kg/m2  SpO75 19% 81 year old woman in obvious discomfort but no acute distress Moves slowly Alert and oriented 3 with no focal neurologic deficits Lungs clear bilaterally  Diagnostic Tests: CT of the chest from April was reviewed. It also was reviewed with Mr. and Mrs. Frosch.  Impression: 81 year old woman with a mixed solid/sub-solid nodule in the superior segment of the left lower lobe. This is highly suspicious for an adenocarcinoma. She does have multiple other groundglass opacities which have been stable over time. There is a good possibility that this represents multifocal adenocarcinoma in situ, and any of these lesions could progress to invasive cancer with time.  I once again reviewed options for diagnosis and treatment of the nodule. Basically I think the options  boil down to vats for resection. Given the location and size of the tumor I would just do a superior segmentectomy as I don't think there would be any significant functional lung remaining in that segment after a wedge resection, and that would be definitive treatment as well. The other option would be to do a CT-guided needle biopsy and then treat with radiation.   She previously had indicated her desire to proceed with surgery for definitive diagnosis and treatment. However today she is not interested in having surgery at all. This may be because she feels poorly due to her back strain. She is requesting to talk to Dr. Pablo Ledger about the possibility of SBRT to the nodule. I will arrange that.   Plan: Appointment with Dr. Pablo Ledger to discuss possible SBRT  If she decides to opt for SBRT, she will need a CT-guided needle biopsy.  If she changes her mind and decides to proceed with surgery, we will schedule her for a left VATS and left lower lobe superior segmentectomy.  [name], MD [facility] [phone]

## 2014-10-27 ENCOUNTER — Ambulatory Visit
Admission: RE | Admit: 2014-10-27 | Discharge: 2014-10-27 | Disposition: A | Discharge status: Home / Self Care | Payer: Medicare HMO | Source: Ambulatory Visit | Attending: Radiation Oncology | Admitting: Radiation Oncology

## 2014-10-27 ENCOUNTER — Ambulatory Visit: Admission: RE | Admit: 2014-10-27 | Payer: Medicare HMO | Source: Ambulatory Visit

## 2014-10-27 ENCOUNTER — Encounter: Payer: Self-pay | Admitting: Radiation Oncology

## 2014-10-27 VITALS — BP 161/58 | HR 68 | Temp 97.4°F | Resp 12 | Wt 142.3 lb

## 2014-10-27 DIAGNOSIS — C50412 Malignant neoplasm of upper-outer quadrant of left female breast: Secondary | ICD-10-CM

## 2014-10-27 DIAGNOSIS — C50911 Malignant neoplasm of unspecified site of right female breast: Secondary | ICD-10-CM

## 2014-10-27 HISTORY — DX: Unspecified malignant neoplasm of skin, unspecified: C44.90

## 2014-10-27 NOTE — Progress Notes (Signed)
Department of Radiation Oncology  Phone:  [phone] Fax:        [phone]   Name: Melissa Snyder MRN: [id number]  DOB: 19-Jun-1933  Date: 10/27/2014  Follow Up Visit Note  Diagnosis: Breast cancer   Staging form: Breast, AJCC 7th Edition     Pathologic stage from 10/17/2014: Stage IA (T1b, N0, cM0) - Signed by [name], MD on 10/17/2014   Summary and Interval since last radiation: 10/07/14 Site/dose: Right breast/ 42.72 Gy at 2.67 Gy per fraction x 21 fractions.    Interval History: Melissa Snyder presents today for routine followup. Ct of the chest 09/27/14 showed enlargement of the central solid component of the left lower lung pulmonary nodule. She spoke with Dr. Roxan Hockey regarding surgery. He thought the lesion was suspisious for adenocarcinoma. She has other ground glass opacities which are stable over time likely representing multifocal adenocarcinoma in situ. He offered her 2 options including superior segmentectomy vs CT guided biopsy and SBRT. She wanted to hear about radiation options before deciding on surgery. She has healed well from radiation to her breast. She elected not to take anti-estrogen therapy. She is having some soreness in the right axilla. She also has some difficulty with right shoulder motion.  Physical Exam:  Filed Vitals:   10/27/14 1526  BP: 161/58  Pulse: 68  Temp: 97.4 F (36.3 C)  TempSrc: Oral  Resp: 12  Weight: 142 lb 4.8 oz (64.547 kg)  SpO2: 100%  Well healed right breast. Some hyperpigmentation in the right axilla.  IMPRESSION: Melissa Snyder is a 81 y.o. female with likely Stage I adenocarcinoma of the left lower lobe in addition to resolving acute effects of radiation to the right breast  PLAN:  We discussed that he has stage I lung cancer and the results of SBRT with provide a greater than 90% local control. We discussed that he may fail in the mediastinum hilum or elsewhere in the body and radiation was a local only process. We discussed  the process of simulation and the use of a pad all to decrease respiratory motion. We discussed the use of 4-dimensional simulation to minimize normal lung tissue treated and the use of respiratory compression. We discussed 3- 5 treatments occurring every other day as an outpatient. We discussed these treatments will last about 10-20 minutes and he would be here at the hospital for about an hour. We discussed that SBRT was unlikely to make his breathing any worse. It is also unlikely to make his breathing symptoms any better. We discussed possible side effects including his shoulder pain due to arm positioning and possible rib fracture withthe pleural-based nodules proximity to the ribs. We discussed damage to other critical normal structures including heart, ribs, lung collapse, chronic cough, and brachial plexus injury. We discussed that without treatment this  could develop into a more aggressive or even metastatic cancer.  Interested in "having this thing out" and would like to do surgery. She will contact Dr. Roxan Hockey. Will f/u with me in 3 months. She knows to call if she has any questions or concerns.  I have given her information about our "After Breast Cancer" class at the physical therapists office to address her shoulder issues.   I spent 20 minutes  face to face with the patient and more than 50% of that time was spent in counseling and/or coordination of care.  This document serves as a record of services personally performed by [name], MD. It was created on her behalf  by Darcus Austin, a trained medical scribe. The creation of this record is based on the scribe's personal observations and the provider's statements to them. This document has been checked and approved by the attending provider.   [name], MD s

## 2014-10-27 NOTE — Progress Notes (Addendum)
Thoracic Location of Tumor / Histology: Bilateral lung nodules, left lobe more prominent   Patient presented: June 2015 CT of chest   Biopsies of    Tobacco/Marijuana/Snuff/ETOH use: no  Past/Anticipated interventions by cardiothoracic surgery, if any: Dr.Hendrickson initially suggested a superior segmentectomy vs. Ct-guided needle biopsy.   Past/Anticipated interventions by medical oncology, if any: Reports that Dr. Burr Medico doesn't want to do chemotherapy due to "the clot in her eye"  Signs/Symptoms  Weight changes, if any: No  Respiratory complaints, if any: nonproductive cough mostly at night.   Hemoptysis, if any: No  Pain issues, if any: No  SAFETY ISSUES:  Prior radiation? Yes-Right breast cancer 09/16/14-10/07/14  Pacemaker/ICD? No  Possible current pregnancy? No  Is the patient on methotrexate? No  Current Complaints / other details: Is completed blind in left eye, happening in May 2015, reporting she had a "stroke" in her eye. Has questions for Dr. Pablo Ledger regarding radiation treatment options.

## 2014-11-04 ENCOUNTER — Ambulatory Visit: Payer: Medicare HMO | Admitting: Radiation Oncology

## 2014-11-04 ENCOUNTER — Ambulatory Visit: Payer: Medicare HMO

## 2014-11-09 ENCOUNTER — Ambulatory Visit (INDEPENDENT_AMBULATORY_CARE_PROVIDER_SITE_OTHER): Payer: Medicare HMO | Admitting: Thoracic Surgery (Cardiothoracic Vascular Surgery)

## 2014-11-09 ENCOUNTER — Encounter: Payer: Self-pay | Admitting: Thoracic Surgery (Cardiothoracic Vascular Surgery)

## 2014-11-09 VITALS — BP 168/88 | HR 77 | Resp 20 | Ht 66.0 in | Wt 142.0 lb

## 2014-11-09 DIAGNOSIS — R590 Localized enlarged lymph nodes: Secondary | ICD-10-CM

## 2014-11-09 DIAGNOSIS — R599 Enlarged lymph nodes, unspecified: Secondary | ICD-10-CM | POA: Diagnosis not present

## 2014-11-09 DIAGNOSIS — R918 Other nonspecific abnormal finding of lung field: Secondary | ICD-10-CM | POA: Diagnosis not present

## 2014-11-09 NOTE — Progress Notes (Signed)
Melissa Snyder [address]       [place] [address]             [phone]       HPI:  Melissa Snyder is a 81 year old woman with past medical history significant for breast cancer, melanoma, hypertension, hyperlipidemia, left bundle branch block, and blindness in her left eye due to a meningioma.  The lung nodules were initially noted on a chest x-ray done when she was being worked up for meningioma. She had a PET/CT in follow-up in September. The largest of the groundglass opacities had an SUV of only 1.1. There was a subcarinal node that was hypermetabolic with an SUV of 5.8 although it was not enlarged on the scan. She has been followed with scans every 3 months since then. Most recently a scan in April showed that the solid component of the mixed nodule in the superior segment of the left lower lobe was more prominent although the nodule overall had not increased in size. This was concerning for invasive cancer. We discussed the options for diagnosis and treatment. She was interested in surgical resection, but wanted to wait until after she had finished her radiation therapy for breast cancer, which is now complete. When I saw her a couple of weeks ago, she thought she would like to consider radiation. After talking with Dr. Pablo Ledger, she has decided to go ahead with surgery.  She is a lifelong smoker. She has a nonproductive cough mostly at night. She feels well today. She gets SOB with heavy exertion, but does not have any issues with her daily activities. She says that she was told she had a heart murmur by a nurse for her insurance company.  Past Medical History  Diagnosis Date  . Chest pain   . Bundle branch block left   . Migraine   . Hypothyroid   . Hypertension   . Arthritis     OA  . Hyperlipidemia   . GERD (gastroesophageal reflux disease)   . Melanoma in situ of face JUNE 2015    LEFT CHEEK  . Breast cancer 05/2014    ER+/PR+/Her2-  . Family history of breast  cancer   . Family history of bladder cancer   . Family history of prostate cancer   . Blind left eye 10/2013    cva  . Skin cancer    Past Surgical History  Procedure Laterality Date  . Dilation and curettage of uterus      X 2  . Abdominal hysterectomy    . Bilateral salpingoophorectomy    . Bladder repair  2009    cystocele  . Cholecystectomy    . Melanoma excision Left 11/05/23, 11/30/13    CHEEK  . Bunionectomy      bilateral great toe  . Ct radiation therapy guide      Gamma radiation -lt frontal-[facility]  . Cardiac catheterization  2004  . Appendectomy    . Colonoscopy      Current Outpatient Prescriptions  Medication Sig Dispense Refill  . aspirin 81 MG chewable tablet Chew 1 tablet (81 mg total) by mouth daily. 30 tablet 0  . atenolol (TENORMIN) 50 MG tablet Take 50 mg by mouth at bedtime.     Marland Kitchen b complex vitamins capsule Take 1 capsule by mouth daily.    . calcium-vitamin D (OSCAL WITH D) 500-200 MG-UNIT per tablet Take 1 tablet by mouth every morning.    . Digestive Enzymes (DIGESTIVE  ENZYME PO) Take 1 capsule by mouth as needed.    . fish oil-omega-3 fatty acids 1000 MG capsule Take 1 g by mouth every morning.     Marland Kitchen levothyroxine (SYNTHROID, LEVOTHROID) 112 MCG tablet Take 112 mcg by mouth every morning.     . Multiple Vitamin (MULTIVITAMIN) tablet Take 1 tablet by mouth every morning.     . Multiple Vitamins-Minerals (VISION FORMULA PO) Take 1 capsule by mouth daily.    . vitamin E (E-400) 400 UNIT capsule Take 400 Units by mouth daily.     No current facility-administered medications for this visit.   Family History  Problem Relation Age of Onset  . CAD Mother     CABG  . Hyperlipidemia Mother   . AAA (abdominal aortic aneurysm) Mother   . Renal Disease Father   . Kidney disease Father   . CAD Father   . Cancer Father     PROSTATE  . Diabetes Brother   . Hyperlipidemia Brother   . Breast cancer Daughter 68  . Breast cancer Sister 62  . Breast cancer  Sister 75  . Cancer Brother     NOS  . Bladder Cancer Brother 72  . Breast cancer Cousin     five paternal first cousins  . Lung cancer Cousin     3 paternal cousins with lung cancer  . Cancer Cousin     4 paternal first cousins with Cancer NOS  . Cancer Cousin     1 paternal cousin with oral cancer (tongue)   History   Social History  . Marital Status: Married    Spouse Name: N/A  . Number of Children: 4  . Years of Education: N/A   Occupational History  . Not on file.   Social History Main Topics  . Smoking status: Never Smoker   . Smokeless tobacco: Never Used  . Alcohol Use: No  . Drug Use: No  . Sexual Activity: Not on file   Other Topics Concern  . Not on file   Social History Narrative     Physical Exam BP 168/88 mmHg  Pulse 77  Resp 20  Ht _0  (1.676 m)  Wt 142 lb (64.411 kg)  BMI 22.93 kg/m2  SpO69 85%  81 year old woman in no acute distress Alert and oriented 3. Cranial nerves II through XII intact. Motor strength 5 out of 5 in all extremities No cervical or subclavicular adenopathy Lungs diminished breath sounds bilaterally, no wheezing or rales Cardiac regular rate and rhythm normal S1 and S2 no rubs murmurs or gallops (no murmur heard on careful auscultation) Abdomen soft and nontender Extremities without clubbing cyanosis or edema  Diagnostic Tests: CT CHEST WITHOUT CONTRAST  TECHNIQUE: Multidetector CT imaging of the chest was performed following the standard protocol without IV contrast.  COMPARISON: Chest CT 05/25/2014 and 02/11/2014. PET-CT 02/23/2014.  FINDINGS: Mediastinum/Nodes: There are no pathologically enlarged mediastinal, hilar, internal mammary or axillary lymph nodes. New surgical clips are present within the right breast. The thyroid gland, trachea and esophagus demonstrate no significant findings. The heart size is normal. There is no pericardial effusion.Atherosclerosis of the aorta, great vessels and  Coronary artery is again noted.  Lungs/Pleura: There is no pleural effusion. There is stable biapical scarring. Scattered right upper lobe ground-glass densities are stable, most prominent on images 17 and 18. There is a stable left upper lobe ground-glass density on image 13. The dominant lesion within the superior segment of the left lower lobe has  not changed in overall size, measuring 16 x 13 mm on image 22. However, it central solid component has enlarged, measuring 9 mm on image 23.  Upper abdomen: Unremarkable. There is no adrenal mass.  Musculoskeletal/Chest wall: There is no chest wall mass or suspicious osseous finding.  IMPRESSION: 1. Further enlargement of the central solid component within the sub solid left lower lobe pulmonary nodule. This finding remains concerning for adenocarcinoma. 2. The additional scattered small ground-glass nodules in both lungs and biapical scarring are stable. 3. No evidence of adenopathy.   Electronically Signed  By: Richardean Sale M.D.  On: 09/27/2014 13:16  NUCLEAR MEDICINE PET SKULL BASE TO THIGH  TECHNIQUE: 7.3 MCi F-18 FDG was injected intravenously. Full-ring PET imaging was performed from the skull base to thigh after the radiotracer. CT data was obtained and used for attenuation correction and anatomic localization.  FASTING BLOOD GLUCOSE: Value: 90 mg/dl  COMPARISON: CT 02/11/2014  FINDINGS: NECK  No hypermetabolic lymph nodes in the neck.  CHEST  In the superior segment of the left lower lobe there is a 15 mm semi-solid nodule with peripheral ground-glass opacity nodule which is not changed significantly from CT of 11/12/2013. Lesion is very low metabolic activity with SUV max equal 1.1. There are smaller scattered nodules which also do not hypermetabolic.  There is a hypermetabolic subcarinal lymph node with SUV max 5.9. Bilateral hilar lymph nodes are symmetric and mildly  hypermetabolic.  ABDOMEN/PELVIS  No abnormal hypermetabolic activity within the liver, pancreas, adrenal glands, or spleen. No hypermetabolic lymph nodes in the abdomen or pelvis.  SKELETON  No focal hypermetabolic activity to suggest skeletal metastasis.  IMPRESSION: 1. Persistent semi solid nodule in the superior segment left lower lobe with low metabolic activity. Cannot exclude a low-grade adenocarcinoma. Thoracic surgery consultation is recommended. 2. Moderately metabolic subcarinal and hilar lymph nodes are likely reactive. These recommendations are taken from: Recommendations for the Management of Subsolid Pulmonary Nodules Detected at CT: A Statement from the [facility] 2013; 266:1, [phone].   Electronically Signed  By: Suzy Bouchard M.D.  On: 02/23/2014 14:10   I personally reviewed the PET/CT and most recent CT of the chest.  Impression: 81 year old woman with a mixed solid sub-solid nodule in the superior segment of the left lower lobe. This is highly suspicious for a low-grade adenocarcinoma. It was not hypermetabolic by PET CT back in September 2015, however that is not surprising given its appearance. This is been discussed at the multidisciplinary thoracic oncology conference and we are in favor of treating this lesion. She was offered the option of surgical resection versus biopsy and radiation therapy. We discussed the relative advantages and disadvantages of each of those approaches. She now has decided to proceed with surgery.  She did have a hypermetabolic subcarinal node on that PET/CT. This was not pathologically enlarged. I do think we should do an EBUS at the time of surgery to sample the node before doing a surgical resection.  I described the proposed operative procedure to her. We'll plan to do and endobronchial ultrasound followed by a left video-assisted thoracoscopy and superior segmentectomy. I described the general  nature of the procedure to her and her husband. They understand the need for general anesthesia, the incisions to be used, the need for chest tube drainage postoperatively, the expected hospital stay, and the overall recovery. I reviewed the indications, risks, benefits, and alternatives. She understands the risks include, but are not limited to death, MI, DVT, PE, bleeding,  possible need for transfusion, infection, prolonged air leak, cardiac arrhythmias, chronic pain, as well as the possibility of other unforeseeable complications. We discussed the use of cryo analgesia of the intercostal nerves for assistance with perioperative pain management. She understands the risks and benefits of that as well.   Plan:  EBUS, Left VATS, Left Lower Lobe Superior Segmentectomy, cryo-analgesia of intercostal nerves on Thursday June 23,2016  [name], MD [facility] [phone]

## 2014-11-10 ENCOUNTER — Other Ambulatory Visit: Payer: Self-pay | Admitting: *Deleted

## 2014-11-10 DIAGNOSIS — R911 Solitary pulmonary nodule: Secondary | ICD-10-CM

## 2014-11-11 ENCOUNTER — Ambulatory Visit: Payer: Self-pay | Admitting: Radiation Oncology

## 2014-11-17 ENCOUNTER — Ambulatory Visit
Admission: RE | Admit: 2014-11-17 | Discharge: 2014-11-17 | Disposition: A | Discharge status: Home / Self Care | Payer: Medicare HMO | Source: Ambulatory Visit | Attending: Radiation Oncology | Admitting: Radiation Oncology

## 2014-11-17 VITALS — BP 141/70 | HR 72 | Temp 98.0°F | Wt 140.4 lb

## 2014-11-17 DIAGNOSIS — C50412 Malignant neoplasm of upper-outer quadrant of left female breast: Secondary | ICD-10-CM

## 2014-11-17 NOTE — Progress Notes (Addendum)
Patient for assessment of brown spots located on right radiation treatment breast.Appearnce of age spots that have been made more visible secondary to radiation.Denies pain.  Darkening of benign moles is normal after RT. OK to proceed with surgery.

## 2014-11-23 ENCOUNTER — Encounter (HOSPITAL_COMMUNITY): Payer: Self-pay

## 2014-11-23 ENCOUNTER — Other Ambulatory Visit: Payer: Self-pay

## 2014-11-23 ENCOUNTER — Encounter (HOSPITAL_COMMUNITY)
Admission: RE | Admit: 2014-11-23 | Discharge: 2014-11-23 | Disposition: A | Discharge status: Home / Self Care | Payer: Medicare HMO | Source: Ambulatory Visit | Attending: Thoracic Surgery (Cardiothoracic Vascular Surgery) | Admitting: Thoracic Surgery (Cardiothoracic Vascular Surgery)

## 2014-11-23 VITALS — BP 133/59 | HR 72 | Resp 20 | Ht 66.0 in | Wt 138.4 lb

## 2014-11-23 DIAGNOSIS — E039 Hypothyroidism, unspecified: Secondary | ICD-10-CM

## 2014-11-23 DIAGNOSIS — Z85820 Personal history of malignant melanoma of skin: Secondary | ICD-10-CM

## 2014-11-23 DIAGNOSIS — Z79899 Other long term (current) drug therapy: Secondary | ICD-10-CM | POA: Insufficient documentation

## 2014-11-23 DIAGNOSIS — E785 Hyperlipidemia, unspecified: Secondary | ICD-10-CM | POA: Insufficient documentation

## 2014-11-23 DIAGNOSIS — Z0183 Encounter for blood typing: Secondary | ICD-10-CM

## 2014-11-23 DIAGNOSIS — Z01818 Encounter for other preprocedural examination: Secondary | ICD-10-CM | POA: Insufficient documentation

## 2014-11-23 DIAGNOSIS — K219 Gastro-esophageal reflux disease without esophagitis: Secondary | ICD-10-CM | POA: Insufficient documentation

## 2014-11-23 DIAGNOSIS — Z7982 Long term (current) use of aspirin: Secondary | ICD-10-CM

## 2014-11-23 DIAGNOSIS — H5442 Blindness, left eye, normal vision right eye: Secondary | ICD-10-CM

## 2014-11-23 DIAGNOSIS — I10 Essential (primary) hypertension: Secondary | ICD-10-CM

## 2014-11-23 DIAGNOSIS — Z01812 Encounter for preprocedural laboratory examination: Secondary | ICD-10-CM | POA: Insufficient documentation

## 2014-11-23 DIAGNOSIS — I69398 Other sequelae of cerebral infarction: Secondary | ICD-10-CM

## 2014-11-23 DIAGNOSIS — R911 Solitary pulmonary nodule: Secondary | ICD-10-CM

## 2014-11-23 HISTORY — DX: Unspecified osteoarthritis, unspecified site: M19.90

## 2014-11-23 HISTORY — DX: Cerebral infarction, unspecified: I63.9

## 2014-11-23 HISTORY — DX: Solitary pulmonary nodule: R91.1

## 2014-11-23 HISTORY — DX: Benign neoplasm of meninges, unspecified: D32.9

## 2014-11-23 HISTORY — DX: Personal history of other medical treatment: Z92.89

## 2014-11-23 LAB — CBC
HCT: 35.4 % — ABNORMAL LOW (ref 36.0–46.0)
Hemoglobin: 11.8 g/dL — ABNORMAL LOW (ref 12.0–15.0)
MCH: 28.8 pg (ref 26.0–34.0)
MCHC: 33.3 g/dL (ref 30.0–36.0)
MCV: 86.3 fL (ref 78.0–100.0)
Platelets: 161 10*3/uL (ref 150–400)
RBC: 4.1 MIL/uL (ref 3.87–5.11)
RDW: 14.3 % (ref 11.5–15.5)
WBC: 4.9 10*3/uL (ref 4.0–10.5)

## 2014-11-23 LAB — BLOOD GAS, ARTERIAL
Acid-Base Excess: 0.4 mmol/L (ref 0.0–2.0)
BICARBONATE: 24.4 meq/L — AB (ref 20.0–24.0)
DRAWN BY: 428831
FIO2: 0.21 %
O2 SAT: 98.7 %
PATIENT TEMPERATURE: 98.6
PH ART: 7.418 (ref 7.350–7.450)
TCO2: 25.6 mmol/L (ref 0–100)
pCO2 arterial: 38.5 mmHg (ref 35.0–45.0)
pO2, Arterial: 130 mmHg — ABNORMAL HIGH (ref 80.0–100.0)

## 2014-11-23 LAB — PROTIME-INR
INR: 1.08 (ref 0.00–1.49)
PROTHROMBIN TIME: 14.2 s (ref 11.6–15.2)

## 2014-11-23 LAB — COMPREHENSIVE METABOLIC PANEL
ALT: 21 U/L (ref 14–54)
AST: 21 U/L (ref 15–41)
Albumin: 3.9 g/dL (ref 3.5–5.0)
Alkaline Phosphatase: 84 U/L (ref 38–126)
Anion gap: 10 (ref 5–15)
BUN: 17 mg/dL (ref 6–20)
CALCIUM: 8.9 mg/dL (ref 8.9–10.3)
CHLORIDE: 104 mmol/L (ref 101–111)
CO2: 24 mmol/L (ref 22–32)
Creatinine, Ser: 0.53 mg/dL (ref 0.44–1.00)
GFR calc Af Amer: >60 mL/min (ref >60)
Glucose, Bld: 135 mg/dL — ABNORMAL HIGH (ref 65–99)
Potassium: 4.1 mmol/L (ref 3.5–5.1)
Sodium: 138 mmol/L (ref 135–145)
Total Bilirubin: 0.8 mg/dL (ref 0.3–1.2)
Total Protein: 6.9 g/dL (ref 6.5–8.1)

## 2014-11-23 LAB — TYPE AND SCREEN
ABO/RH(D): B NEG
ANTIBODY SCREEN: NEGATIVE

## 2014-11-23 LAB — SURGICAL PCR SCREEN
MRSA, PCR: NEGATIVE
Staphylococcus aureus: NEGATIVE

## 2014-11-23 LAB — ABO/RH: ABO/RH(D): B NEG

## 2014-11-23 LAB — APTT: aPTT: 28 seconds (ref 24–37)

## 2014-11-23 NOTE — Progress Notes (Signed)
Patient unable to give adequate urine specimen during PAT visit will obtain DOS

## 2014-11-23 NOTE — Pre-Procedure Instructions (Signed)
Melissa Snyder  11/23/2014     Your procedure is scheduled on June 23.  Report to [facility] Admitting at 5:30 A.M.  Call this number if you have problems the morning of surgery:  [phone]   Remember:  Do not eat food or drink liquids after midnight.  Take these medicines the morning of surgery with A SIP OF WATER Levothyroxine   STOP Beano, Aspirin, B- Complex, Oscal, Digestive Enzyme, Fish Oil, Multiple Vitamins, Vision Formula, Vitamin E today   STOP/ Do not take Aspirin, Aleve, Naproxen, Advil, Ibuprofen, Motrin, Vitamins, Herbs, or Supplements starting today   Do not wear jewelry, make-up or nail polish.  Do not wear lotions, powders, or perfumes.  You may wear deodorant.  Do not shave 48 hours prior to surgery.  Men may shave face and neck.  Do not bring valuables to the hospital.  [facility] is not responsible for any belongings or valuables.  Contacts, dentures or bridgework may not be worn into surgery.  Leave your suitcase in the car.  After surgery it may be brought to your room.  For patients admitted to the hospital, discharge time will be determined by your treatment team.  [facility] - Preparing for Surgery  Before surgery, you can play an important role.  Because skin is not sterile, your skin needs to be as free of germs as possible.  You can reduce the number of germs on you skin by washing with CHG (chlorahexidine gluconate) soap before surgery.  CHG is an antiseptic cleaner which kills germs and bonds with the skin to continue killing germs even after washing.  Please DO NOT use if you have an allergy to CHG or antibacterial soaps.  If your skin becomes reddened/irritated stop using the CHG and inform your nurse when you arrive at Short Stay.  Do not shave (including legs and underarms) for at least 48 hours prior to the first CHG shower.  You may shave your face.  Please follow these instructions carefully:   1.  Shower with CHG Soap the  night before surgery and the morning of Surgery.  2.  If you choose to wash your hair, wash your hair first as usual with your normal shampoo.  3.  After you shampoo, rinse your hair and body thoroughly to remove the shampoo.  4.  Use CHG as you would any other liquid soap.  You can apply CHG directly to the skin and wash gently with scrungie or a clean washcloth.  5.  Apply the CHG Soap to your body ONLY FROM THE NECK DOWN.  Do not use on open wounds or open sores.  Avoid contact with your eyes, ears, mouth and genitals (private parts).  Wash genitals (private parts) with your normal soap.  6.  Wash thoroughly, paying special attention to the area where your surgery will be performed.  7.  Thoroughly rinse your body with warm water from the neck down.  8.  DO NOT shower/wash with your normal soap after using and rinsing off the CHG Soap.  9.  Pat yourself dry with a clean towel.            10.  Wear clean pajamas.            11.  Place clean sheets on your bed the night of your first shower and do not sleep with pets.  Day of Surgery  Do not apply any lotions the morning of surgery.  Please wear clean clothes to the hospital/surgery center.   Please read over the following fact sheets that you were given. Pain Booklet, Coughing and Deep Breathing, Blood Transfusion Information and Surgical Site Infection Prevention

## 2014-11-23 NOTE — Progress Notes (Signed)
Patient denies CP or Shob. Reports that she seen Dr. Meda Coffee cardiologist in 01/2014 post stroke. PCP Dr. Joylene Draft at [facility].

## 2014-11-24 MED ORDER — DEXTROSE 5 % IV SOLN
1.5000 g | INTRAVENOUS | Status: AC
  Administered 2014-11-25: 1.5 g via INTRAVENOUS
  Filled 2014-11-24: qty 1.5

## 2014-11-24 NOTE — Progress Notes (Signed)
Anesthesia Chart Review:  Pt is 81 year old female scheduled for video bronchoscopy with endobronchial ultrasound on 11/25/2014 with Dr. Roxan Hockey.   PMH includes: HTN, LBBB, hyperlipidemia, stroke (2015, as a result now blind in L eye), breast cancer, hypothyroid, melanoma, GERD. Never smoker. BMI 22. S/p R breast lumpectomy 07/20/14.  Medications include: ASA, atenolol, levothyroxine, prilosec.   Preoperative labs reviewed.    Chest x-ray 11/23/2014 reviewed. The known left lower lobe nodule is not as well appreciated on this exam as on recent CT examination. No acute abnormality is noted.  EKG 11/23/2014: NSR. LBBB. No significant change from prior tracing 10/24/2013 per Dr. Percival Spanish.   Echo 10/25/2013: - Left ventricle: Poor image quality may be inferior hypokinesis. The cavity size was normal. Wall thickness was normal. Systolic function was normal. The estimated ejection fraction was in the range of 50% to 55%. - Aortic valve: There was mild regurgitation. - Mitral valve: There was mild regurgitation.  Carotid duplex US 10/24/2013: Bilateral: mild to moderate soft plaque bilateral CCA and origin ICA. 1-39% ICA stenosis. Vertebral artery flow is antegrade. ICA/CCA ratio: R-1.45 L-1.30.  Cardiac cath 10/09/2002: 1. Normal coronary anatomy. 2. Normal left ventricular function.  Pt with known LBBB identified prior to cath in 2004.  Tolerated breast lumpectomy in February of this year.  I anticipate she can proceed as scheduled.   Willeen Cass, FNP-BC [facility] Stay [facility]/Anesthesiology Phone: [phone] 11/24/2014 9:50 AM

## 2014-11-25 ENCOUNTER — Encounter (HOSPITAL_COMMUNITY): Payer: Self-pay | Admitting: Certified Registered Nurse Anesthetist

## 2014-11-25 ENCOUNTER — Inpatient Hospital Stay (HOSPITAL_COMMUNITY)
Admission: RE | Admit: 2014-11-25 | Discharge: 2014-11-29 | DRG: 165 | Disposition: A | Discharge status: Home / Self Care | Payer: Medicare HMO | Source: Ambulatory Visit | Attending: Thoracic Surgery (Cardiothoracic Vascular Surgery) | Admitting: Thoracic Surgery (Cardiothoracic Vascular Surgery)

## 2014-11-25 ENCOUNTER — Inpatient Hospital Stay (HOSPITAL_COMMUNITY): Payer: Medicare HMO | Admitting: Certified Registered Nurse Anesthetist

## 2014-11-25 ENCOUNTER — Inpatient Hospital Stay (HOSPITAL_COMMUNITY): Payer: Medicare HMO

## 2014-11-25 ENCOUNTER — Inpatient Hospital Stay (HOSPITAL_COMMUNITY): Payer: Medicare HMO | Admitting: Emergency Medicine

## 2014-11-25 ENCOUNTER — Encounter (HOSPITAL_COMMUNITY)
Admission: RE | Disposition: A | Discharge status: Home / Self Care | Payer: Self-pay | Source: Ambulatory Visit | Attending: Thoracic Surgery (Cardiothoracic Vascular Surgery)

## 2014-11-25 DIAGNOSIS — Z803 Family history of malignant neoplasm of breast: Secondary | ICD-10-CM

## 2014-11-25 DIAGNOSIS — E785 Hyperlipidemia, unspecified: Secondary | ICD-10-CM | POA: Diagnosis present

## 2014-11-25 DIAGNOSIS — Z85820 Personal history of malignant melanoma of skin: Secondary | ICD-10-CM | POA: Diagnosis not present

## 2014-11-25 DIAGNOSIS — M1990 Unspecified osteoarthritis, unspecified site: Secondary | ICD-10-CM | POA: Diagnosis present

## 2014-11-25 DIAGNOSIS — Z853 Personal history of malignant neoplasm of breast: Secondary | ICD-10-CM | POA: Diagnosis not present

## 2014-11-25 DIAGNOSIS — Z90710 Acquired absence of both cervix and uterus: Secondary | ICD-10-CM | POA: Diagnosis not present

## 2014-11-25 DIAGNOSIS — I700 Atherosclerosis of aorta: Secondary | ICD-10-CM | POA: Diagnosis present

## 2014-11-25 DIAGNOSIS — Z170 Estrogen receptor positive status [ER+]: Secondary | ICD-10-CM

## 2014-11-25 DIAGNOSIS — F17210 Nicotine dependence, cigarettes, uncomplicated: Secondary | ICD-10-CM | POA: Diagnosis present

## 2014-11-25 DIAGNOSIS — Z7982 Long term (current) use of aspirin: Secondary | ICD-10-CM

## 2014-11-25 DIAGNOSIS — D329 Benign neoplasm of meninges, unspecified: Secondary | ICD-10-CM | POA: Diagnosis present

## 2014-11-25 DIAGNOSIS — Z79899 Other long term (current) drug therapy: Secondary | ICD-10-CM | POA: Diagnosis not present

## 2014-11-25 DIAGNOSIS — E039 Hypothyroidism, unspecified: Secondary | ICD-10-CM | POA: Diagnosis present

## 2014-11-25 DIAGNOSIS — Z8052 Family history of malignant neoplasm of bladder: Secondary | ICD-10-CM | POA: Diagnosis not present

## 2014-11-25 DIAGNOSIS — R911 Solitary pulmonary nodule: Secondary | ICD-10-CM | POA: Diagnosis present

## 2014-11-25 DIAGNOSIS — I10 Essential (primary) hypertension: Secondary | ICD-10-CM | POA: Diagnosis present

## 2014-11-25 DIAGNOSIS — H5442 Blindness, left eye, normal vision right eye: Secondary | ICD-10-CM | POA: Diagnosis present

## 2014-11-25 DIAGNOSIS — Z801 Family history of malignant neoplasm of trachea, bronchus and lung: Secondary | ICD-10-CM | POA: Diagnosis not present

## 2014-11-25 DIAGNOSIS — Z8673 Personal history of transient ischemic attack (TIA), and cerebral infarction without residual deficits: Secondary | ICD-10-CM

## 2014-11-25 DIAGNOSIS — Z8042 Family history of malignant neoplasm of prostate: Secondary | ICD-10-CM

## 2014-11-25 DIAGNOSIS — M62838 Other muscle spasm: Secondary | ICD-10-CM | POA: Diagnosis present

## 2014-11-25 DIAGNOSIS — K219 Gastro-esophageal reflux disease without esophagitis: Secondary | ICD-10-CM | POA: Diagnosis present

## 2014-11-25 DIAGNOSIS — C3432 Malignant neoplasm of lower lobe, left bronchus or lung: Secondary | ICD-10-CM | POA: Diagnosis present

## 2014-11-25 DIAGNOSIS — Z902 Acquired absence of lung [part of]: Secondary | ICD-10-CM

## 2014-11-25 DIAGNOSIS — C3490 Malignant neoplasm of unspecified part of unspecified bronchus or lung: Secondary | ICD-10-CM

## 2014-11-25 HISTORY — PX: VIDEO BRONCHOSCOPY WITH ENDOBRONCHIAL ULTRASOUND: SHX6177

## 2014-11-25 HISTORY — PX: SEGMENTECOMY: SHX5076

## 2014-11-25 HISTORY — PX: CRYO INTERCOSTAL NERVE BLOCK: SHX6522

## 2014-11-25 HISTORY — PX: VIDEO ASSISTED THORACOSCOPY: SHX5073

## 2014-11-25 LAB — URINE MICROSCOPIC-ADD ON: RBC / HPF: NONE SEEN RBC/hpf (ref <3)

## 2014-11-25 LAB — URINALYSIS, ROUTINE W REFLEX MICROSCOPIC
BILIRUBIN URINE: NEGATIVE
Glucose, UA: NEGATIVE mg/dL
Ketones, ur: NEGATIVE mg/dL
Nitrite: NEGATIVE
PROTEIN: NEGATIVE mg/dL
SPECIFIC GRAVITY, URINE: 1.02 (ref 1.005–1.030)
UROBILINOGEN UA: 0.2 mg/dL (ref 0.0–1.0)
pH: 5.5 (ref 5.0–8.0)

## 2014-11-25 SURGERY — BRONCHOSCOPY, WITH EBUS
Anesthesia: General | Site: Chest

## 2014-11-25 MED ORDER — LEVOTHYROXINE SODIUM 112 MCG PO TABS
112.0000 ug | ORAL_TABLET | Freq: Every day | ORAL | Status: DC
  Administered 2014-11-26 – 2014-11-29 (×4): 112 ug via ORAL
  Filled 2014-11-25 (×5): qty 1

## 2014-11-25 MED ORDER — STERILE WATER FOR INJECTION IJ SOLN
INTRAMUSCULAR | Status: AC
  Filled 2014-11-25: qty 10

## 2014-11-25 MED ORDER — ONDANSETRON HCL 4 MG/2ML IJ SOLN
INTRAMUSCULAR | Status: AC
  Filled 2014-11-25: qty 2

## 2014-11-25 MED ORDER — POTASSIUM CHLORIDE 10 MEQ/50ML IV SOLN: 10.0000 meq | Freq: Every day | INTRAVENOUS | Status: DC | PRN

## 2014-11-25 MED ORDER — PROPOFOL 10 MG/ML IV BOLUS
INTRAVENOUS | Status: AC
  Filled 2014-11-25: qty 20

## 2014-11-25 MED ORDER — ALBUTEROL SULFATE (2.5 MG/3ML) 0.083% IN NEBU
2.5000 mg | INHALATION_SOLUTION | RESPIRATORY_TRACT | Status: DC
  Administered 2014-11-25: 2.5 mg via RESPIRATORY_TRACT
  Filled 2014-11-25: qty 3

## 2014-11-25 MED ORDER — GLYCOPYRROLATE 0.2 MG/ML IJ SOLN
INTRAMUSCULAR | Status: AC
  Filled 2014-11-25: qty 2

## 2014-11-25 MED ORDER — GLYCOPYRROLATE 0.2 MG/ML IJ SOLN
INTRAMUSCULAR | Status: DC | PRN
  Administered 2014-11-25: 0.6 mg via INTRAVENOUS

## 2014-11-25 MED ORDER — PANTOPRAZOLE SODIUM 40 MG PO TBEC
40.0000 mg | DELAYED_RELEASE_TABLET | Freq: Every day | ORAL | Status: DC
  Administered 2014-11-25: 40 mg via ORAL

## 2014-11-25 MED ORDER — MIDAZOLAM HCL 2 MG/2ML IJ SOLN
INTRAMUSCULAR | Status: AC
  Filled 2014-11-25: qty 2

## 2014-11-25 MED ORDER — ATENOLOL 50 MG PO TABS
50.0000 mg | ORAL_TABLET | Freq: Every day | ORAL | Status: DC
  Administered 2014-11-25 – 2014-11-28 (×4): 50 mg via ORAL
  Filled 2014-11-25 (×7): qty 1

## 2014-11-25 MED ORDER — VECURONIUM BROMIDE 10 MG IV SOLR
INTRAVENOUS | Status: DC | PRN
  Administered 2014-11-25: 3 mg via INTRAVENOUS
  Administered 2014-11-25: 2 mg via INTRAVENOUS

## 2014-11-25 MED ORDER — NEOSTIGMINE METHYLSULFATE 10 MG/10ML IV SOLN
INTRAVENOUS | Status: DC | PRN
  Administered 2014-11-25: 4 mg via INTRAVENOUS

## 2014-11-25 MED ORDER — VECURONIUM BROMIDE 10 MG IV SOLR
INTRAVENOUS | Status: AC
  Filled 2014-11-25: qty 10

## 2014-11-25 MED ORDER — FENTANYL CITRATE (PF) 250 MCG/5ML IJ SOLN
INTRAMUSCULAR | Status: AC
  Filled 2014-11-25: qty 5

## 2014-11-25 MED ORDER — PANTOPRAZOLE SODIUM 40 MG PO TBEC
40.0000 mg | DELAYED_RELEASE_TABLET | Freq: Every day | ORAL | Status: DC
  Filled 2014-11-25: qty 1

## 2014-11-25 MED ORDER — DEXAMETHASONE SODIUM PHOSPHATE 10 MG/ML IJ SOLN
INTRAMUSCULAR | Status: AC
  Filled 2014-11-25: qty 1

## 2014-11-25 MED ORDER — LACTATED RINGERS IV SOLN
INTRAVENOUS | Status: DC | PRN
  Administered 2014-11-25: 07:00:00 via INTRAVENOUS

## 2014-11-25 MED ORDER — ARTIFICIAL TEARS OP OINT
TOPICAL_OINTMENT | OPHTHALMIC | Status: AC
  Filled 2014-11-25: qty 3.5

## 2014-11-25 MED ORDER — FENTANYL CITRATE (PF) 100 MCG/2ML IJ SOLN
INTRAMUSCULAR | Status: DC | PRN
  Administered 2014-11-25: 25 ug via INTRAVENOUS
  Administered 2014-11-25 (×2): 50 ug via INTRAVENOUS
  Administered 2014-11-25 (×2): 100 ug via INTRAVENOUS
  Administered 2014-11-25: 50 ug via INTRAVENOUS
  Administered 2014-11-25: 125 ug via INTRAVENOUS

## 2014-11-25 MED ORDER — CETYLPYRIDINIUM CHLORIDE 0.05 % MT LIQD
7.0000 mL | Freq: Two times a day (BID) | OROMUCOSAL | Status: DC
  Administered 2014-11-25 – 2014-11-29 (×7): 7 mL via OROMUCOSAL

## 2014-11-25 MED ORDER — HYDROMORPHONE HCL 1 MG/ML IJ SOLN
INTRAMUSCULAR | Status: AC
  Filled 2014-11-25: qty 1

## 2014-11-25 MED ORDER — BISACODYL 5 MG PO TBEC
10.0000 mg | DELAYED_RELEASE_TABLET | Freq: Every day | ORAL | Status: DC
  Administered 2014-11-25 – 2014-11-26 (×2): 10 mg via ORAL
  Filled 2014-11-25 (×2): qty 2

## 2014-11-25 MED ORDER — MIDAZOLAM HCL 5 MG/5ML IJ SOLN
INTRAMUSCULAR | Status: DC | PRN
  Administered 2014-11-25: 1 mg via INTRAVENOUS

## 2014-11-25 MED ORDER — LIDOCAINE HCL (CARDIAC) 20 MG/ML IV SOLN
INTRAVENOUS | Status: DC | PRN
  Administered 2014-11-25: 50 mg via INTRAVENOUS

## 2014-11-25 MED ORDER — LABETALOL HCL 5 MG/ML IV SOLN
INTRAVENOUS | Status: DC | PRN
  Administered 2014-11-25 (×2): 5 mg via INTRAVENOUS

## 2014-11-25 MED ORDER — ONDANSETRON HCL 4 MG/2ML IJ SOLN
INTRAMUSCULAR | Status: DC | PRN
  Administered 2014-11-25: 4 mg via INTRAVENOUS

## 2014-11-25 MED ORDER — LIDOCAINE HCL (CARDIAC) 20 MG/ML IV SOLN
INTRAVENOUS | Status: AC
  Filled 2014-11-25: qty 5

## 2014-11-25 MED ORDER — PROMETHAZINE HCL 25 MG/ML IJ SOLN: 6.2500 mg | INTRAMUSCULAR | Status: DC | PRN

## 2014-11-25 MED ORDER — PHENYLEPHRINE HCL 10 MG/ML IJ SOLN
INTRAMUSCULAR | Status: DC | PRN
  Administered 2014-11-25 (×2): 40 ug via INTRAVENOUS
  Administered 2014-11-25: 20 ug via INTRAVENOUS

## 2014-11-25 MED ORDER — NALOXONE HCL 0.4 MG/ML IJ SOLN
0.4000 mg | INTRAMUSCULAR | Status: DC | PRN
  Filled 2014-11-25: qty 1

## 2014-11-25 MED ORDER — ACETAMINOPHEN 160 MG/5ML PO SOLN
1000.0000 mg | Freq: Four times a day (QID) | ORAL | Status: DC
  Filled 2014-11-25 (×19): qty 40

## 2014-11-25 MED ORDER — EPINEPHRINE HCL 1 MG/ML IJ SOLN
INTRAMUSCULAR | Status: AC
  Filled 2014-11-25: qty 1

## 2014-11-25 MED ORDER — ONDANSETRON HCL 4 MG/2ML IJ SOLN
4.0000 mg | Freq: Four times a day (QID) | INTRAMUSCULAR | Status: DC | PRN
  Administered 2014-11-28: 4 mg via INTRAVENOUS
  Filled 2014-11-25: qty 2

## 2014-11-25 MED ORDER — 0.9 % SODIUM CHLORIDE (POUR BTL) OPTIME
TOPICAL | Status: DC | PRN
  Administered 2014-11-25: 1000 mL

## 2014-11-25 MED ORDER — ACETAMINOPHEN 500 MG PO TABS
1000.0000 mg | ORAL_TABLET | Freq: Four times a day (QID) | ORAL | Status: DC
  Administered 2014-11-25 – 2014-11-29 (×15): 1000 mg via ORAL
  Filled 2014-11-25 (×21): qty 2

## 2014-11-25 MED ORDER — DEXAMETHASONE SODIUM PHOSPHATE 10 MG/ML IJ SOLN
INTRAMUSCULAR | Status: DC | PRN
  Administered 2014-11-25: 10 mg via INTRAVENOUS

## 2014-11-25 MED ORDER — PROPOFOL 10 MG/ML IV BOLUS
INTRAVENOUS | Status: DC | PRN
  Administered 2014-11-25: 10 mg via INTRAVENOUS
  Administered 2014-11-25: 30 mg via INTRAVENOUS
  Administered 2014-11-25: 10 mg via INTRAVENOUS
  Administered 2014-11-25: 30 mg via INTRAVENOUS
  Administered 2014-11-25: 120 mg via INTRAVENOUS

## 2014-11-25 MED ORDER — DEXTROSE 5 % IV SOLN
1.5000 g | Freq: Two times a day (BID) | INTRAVENOUS | Status: AC
  Administered 2014-11-25 – 2014-11-26 (×2): 1.5 g via INTRAVENOUS
  Filled 2014-11-25 (×3): qty 1.5

## 2014-11-25 MED ORDER — FENTANYL 10 MCG/ML IV SOLN
INTRAVENOUS | Status: DC
  Administered 2014-11-25: 12:00:00 via INTRAVENOUS
  Administered 2014-11-25: 220 ug via INTRAVENOUS
  Administered 2014-11-26: 80 ug via INTRAVENOUS
  Administered 2014-11-26: 20 ug via INTRAVENOUS
  Administered 2014-11-26: 90 ug via INTRAVENOUS
  Administered 2014-11-26: 40 ug via INTRAVENOUS
  Administered 2014-11-26: 20 ug via INTRAVENOUS
  Administered 2014-11-26: 40 ug via INTRAVENOUS
  Administered 2014-11-27 (×2): 20 ug via INTRAVENOUS
  Administered 2014-11-27: 40 ug via INTRAVENOUS
  Administered 2014-11-27: 50 ug via INTRAVENOUS
  Administered 2014-11-27: 30 ug via INTRAVENOUS
  Administered 2014-11-27: 20 ug via INTRAVENOUS
  Administered 2014-11-28: 10 ug via INTRAVENOUS
  Administered 2014-11-28: 40 ug via INTRAVENOUS
  Administered 2014-11-28: 54 ug via INTRAVENOUS
  Filled 2014-11-25 (×2): qty 50

## 2014-11-25 MED ORDER — SODIUM CHLORIDE 0.9 % IJ SOLN: 9.0000 mL | INTRAMUSCULAR | Status: DC | PRN

## 2014-11-25 MED ORDER — SUCCINYLCHOLINE CHLORIDE 20 MG/ML IJ SOLN
INTRAMUSCULAR | Status: AC
  Filled 2014-11-25: qty 1

## 2014-11-25 MED ORDER — PHENYLEPHRINE HCL 10 MG/ML IJ SOLN
10.0000 mg | INTRAVENOUS | Status: DC | PRN
  Administered 2014-11-25: 10 ug/min via INTRAVENOUS

## 2014-11-25 MED ORDER — DIPHENHYDRAMINE HCL 50 MG/ML IJ SOLN
12.5000 mg | Freq: Four times a day (QID) | INTRAMUSCULAR | Status: DC | PRN
  Filled 2014-11-25: qty 0.25

## 2014-11-25 MED ORDER — SENNOSIDES-DOCUSATE SODIUM 8.6-50 MG PO TABS
1.0000 | ORAL_TABLET | Freq: Every day | ORAL | Status: DC
  Administered 2014-11-25 – 2014-11-26 (×2): 1 via ORAL
  Filled 2014-11-25 (×3): qty 1

## 2014-11-25 MED ORDER — ASPIRIN 81 MG PO CHEW
81.0000 mg | CHEWABLE_TABLET | Freq: Every day | ORAL | Status: DC
  Administered 2014-11-26 – 2014-11-29 (×4): 81 mg via ORAL
  Filled 2014-11-25 (×4): qty 1

## 2014-11-25 MED ORDER — HYDROMORPHONE HCL 1 MG/ML IJ SOLN
0.2500 mg | INTRAMUSCULAR | Status: DC | PRN
  Administered 2014-11-25 (×4): 0.5 mg via INTRAVENOUS

## 2014-11-25 MED ORDER — NEOSTIGMINE METHYLSULFATE 10 MG/10ML IV SOLN
INTRAVENOUS | Status: AC
  Filled 2014-11-25: qty 1

## 2014-11-25 MED ORDER — ROCURONIUM BROMIDE 100 MG/10ML IV SOLN
INTRAVENOUS | Status: DC | PRN
  Administered 2014-11-25: 50 mg via INTRAVENOUS

## 2014-11-25 MED ORDER — DEXTROSE-NACL 5-0.9 % IV SOLN
INTRAVENOUS | Status: DC
Start: 2014-11-25 — End: 2014-11-26
  Administered 2014-11-25 – 2014-11-26 (×2): via INTRAVENOUS

## 2014-11-25 MED ORDER — DIPHENHYDRAMINE HCL 12.5 MG/5ML PO ELIX
12.5000 mg | ORAL_SOLUTION | Freq: Four times a day (QID) | ORAL | Status: DC | PRN
  Filled 2014-11-25: qty 5

## 2014-11-25 MED ORDER — ONDANSETRON HCL 4 MG/2ML IJ SOLN
4.0000 mg | Freq: Four times a day (QID) | INTRAMUSCULAR | Status: DC | PRN
  Filled 2014-11-25: qty 2

## 2014-11-25 MED ORDER — ROCURONIUM BROMIDE 50 MG/5ML IV SOLN
INTRAVENOUS | Status: AC
  Filled 2014-11-25: qty 1

## 2014-11-25 MED ORDER — PHENYLEPHRINE 40 MCG/ML (10ML) SYRINGE FOR IV PUSH (FOR BLOOD PRESSURE SUPPORT)
PREFILLED_SYRINGE | INTRAVENOUS | Status: AC
  Filled 2014-11-25: qty 10

## 2014-11-25 SURGICAL SUPPLY — 103 items
ADH SKN CLS APL DERMABOND .7 (GAUZE/BANDAGES/DRESSINGS) ×2
ADH SKN CLS LQ APL DERMABOND (GAUZE/BANDAGES/DRESSINGS) ×2
APPLIER CLIP ROT 10 11.4 M/L (STAPLE)
APR CLP MED LRG 11.4X10 (STAPLE)
BAG SPEC RTRVL LRG 6X4 10 (ENDOMECHANICALS) ×2
BALL CTTN LRG ABS STRL LF (GAUZE/BANDAGES/DRESSINGS)
BRUSH CYTOL CELLEBRITY 1.5X140 (MISCELLANEOUS) IMPLANT
CANISTER SUCTION 2500CC (MISCELLANEOUS) ×6 IMPLANT
CATH KIT ON Q 5IN SLV (PAIN MANAGEMENT) IMPLANT
CATH THORACIC 28FR (CATHETERS) ×1 IMPLANT
CATH THORACIC 28FR RT ANG (CATHETERS) IMPLANT
CATH THORACIC 36FR (CATHETERS) IMPLANT
CATH THORACIC 36FR RT ANG (CATHETERS) IMPLANT
CLIP APPLIE ROT 10 11.4 M/L (STAPLE) IMPLANT
CLIP TI MEDIUM 6 (CLIP) ×1 IMPLANT
CONN Y 3/8X3/8X3/8  BEN (MISCELLANEOUS) ×1
CONN Y 3/8X3/8X3/8 BEN (MISCELLANEOUS) ×2 IMPLANT
CONT SPEC 4OZ CLIKSEAL STRL BL (MISCELLANEOUS) ×11 IMPLANT
COTTONBALL LRG STERILE PKG (GAUZE/BANDAGES/DRESSINGS) IMPLANT
COVER SURGICAL LIGHT HANDLE (MISCELLANEOUS) ×3 IMPLANT
COVER TABLE BACK 60X90 (DRAPES) ×3 IMPLANT
CUTTER ECHEON FLEX ENDO 45 340 (ENDOMECHANICALS) ×1 IMPLANT
DERMABOND ADHESIVE PROPEN (GAUZE/BANDAGES/DRESSINGS) ×1
DERMABOND ADVANCED (GAUZE/BANDAGES/DRESSINGS) ×1
DERMABOND ADVANCED .7 DNX12 (GAUZE/BANDAGES/DRESSINGS) IMPLANT
DERMABOND ADVANCED .7 DNX6 (GAUZE/BANDAGES/DRESSINGS) IMPLANT
DRAIN CHANNEL 28F RND 3/8 FF (WOUND CARE) IMPLANT
DRAIN CHANNEL 32F RND 10.7 FF (WOUND CARE) IMPLANT
DRAPE LAPAROSCOPIC ABDOMINAL (DRAPES) ×3 IMPLANT
DRAPE WARM FLUID 44X44 (DRAPE) ×3 IMPLANT
ELECT REM PT RETURN 9FT ADLT (ELECTROSURGICAL) ×3
ELECTRODE REM PT RTRN 9FT ADLT (ELECTROSURGICAL) ×2 IMPLANT
FILTER STRAW FLUID ASPIR (MISCELLANEOUS) IMPLANT
FORCEPS BIOP RJ4 1.8 (CUTTING FORCEPS) IMPLANT
GAUZE SPONGE 4X4 12PLY STRL (GAUZE/BANDAGES/DRESSINGS) ×3 IMPLANT
GLOVE SURG SIGNA 7.5 PF LTX (GLOVE) ×9 IMPLANT
GOWN STRL REUS W/ TWL LRG LVL3 (GOWN DISPOSABLE) ×4 IMPLANT
GOWN STRL REUS W/ TWL XL LVL3 (GOWN DISPOSABLE) ×4 IMPLANT
GOWN STRL REUS W/TWL LRG LVL3 (GOWN DISPOSABLE) ×6
GOWN STRL REUS W/TWL XL LVL3 (GOWN DISPOSABLE) ×6
HEMOSTAT SURGICEL 2X14 (HEMOSTASIS) IMPLANT
KIT BASIN OR (CUSTOM PROCEDURE TRAY) ×3 IMPLANT
KIT CLEAN ENDO COMPLIANCE (KITS) ×3 IMPLANT
KIT ROOM TURNOVER OR (KITS) ×6 IMPLANT
KIT SUCTION CATH 14FR (SUCTIONS) ×3 IMPLANT
MARKER SKIN DUAL TIP RULER LAB (MISCELLANEOUS) ×3 IMPLANT
NDL BIOPSY TRANSBRONCH 21G (NEEDLE) IMPLANT
NDL BLUNT 18X1 FOR OR ONLY (NEEDLE) IMPLANT
NEEDLE 22X1 1/2 (OR ONLY) (NEEDLE) IMPLANT
NEEDLE BIOPSY TRANSBRONCH 21G (NEEDLE) IMPLANT
NEEDLE BLUNT 18X1 FOR OR ONLY (NEEDLE) IMPLANT
NEEDLE SONO TIP II EBUS (NEEDLE) ×3 IMPLANT
NS IRRIG 1000ML POUR BTL (IV SOLUTION) ×9 IMPLANT
OIL SILICONE PENTAX (PARTS (SERVICE/REPAIRS)) IMPLANT
PACK CHEST (CUSTOM PROCEDURE TRAY) ×3 IMPLANT
PAD ARMBOARD 7.5X6 YLW CONV (MISCELLANEOUS) ×12 IMPLANT
POUCH ENDO CATCH II 15MM (MISCELLANEOUS) IMPLANT
POUCH SPECIMEN RETRIEVAL 10MM (ENDOMECHANICALS) ×1 IMPLANT
RELOAD GOLD ECHELON 45 (STAPLE) ×3 IMPLANT
RELOAD GREEN ECHELON 45 (STAPLE) ×3 IMPLANT
RELOAD STAPLE 35X2.5 WHT THIN (STAPLE) IMPLANT
SEALANT PROGEL (MISCELLANEOUS) IMPLANT
SEALANT SURG COSEAL 4ML (VASCULAR PRODUCTS) IMPLANT
SEALANT SURG COSEAL 8ML (VASCULAR PRODUCTS) IMPLANT
SOLUTION ANTI FOG 6CC (MISCELLANEOUS) ×3 IMPLANT
SPECIMEN JAR MEDIUM (MISCELLANEOUS) ×3 IMPLANT
SPONGE GAUZE 4X4 12PLY STER LF (GAUZE/BANDAGES/DRESSINGS) ×1 IMPLANT
SPONGE INTESTINAL PEANUT (DISPOSABLE) IMPLANT
STAPLE RELOAD 2.5MM WHITE (STAPLE) ×3 IMPLANT
STAPLER VASCULAR ECHELON 35 (CUTTER) ×1 IMPLANT
SUT PROLENE 4 0 RB 1 (SUTURE)
SUT PROLENE 4-0 RB1 .5 CRCL 36 (SUTURE) IMPLANT
SUT SILK  1 MH (SUTURE) ×2
SUT SILK 1 MH (SUTURE) ×4 IMPLANT
SUT SILK 2 0SH CR/8 30 (SUTURE) IMPLANT
SUT SILK 3 0SH CR/8 30 (SUTURE) IMPLANT
SUT VIC AB 1 CTX 36 (SUTURE) ×3
SUT VIC AB 1 CTX36XBRD ANBCTR (SUTURE) IMPLANT
SUT VIC AB 2-0 CTX 36 (SUTURE) ×1 IMPLANT
SUT VIC AB 2-0 UR6 27 (SUTURE) IMPLANT
SUT VIC AB 3-0 MH 27 (SUTURE) IMPLANT
SUT VIC AB 3-0 X1 27 (SUTURE) ×3 IMPLANT
SUT VICRYL 2 TP 1 (SUTURE) IMPLANT
SWAB COLLECTION DEVICE MRSA (MISCELLANEOUS) IMPLANT
SYR 20CC LL (SYRINGE) ×3 IMPLANT
SYR 20ML ECCENTRIC (SYRINGE) ×3 IMPLANT
SYR 5ML LL (SYRINGE) ×3 IMPLANT
SYR 5ML LUER SLIP (SYRINGE) ×3 IMPLANT
SYR CONTROL 10ML LL (SYRINGE) IMPLANT
SYSTEM SAHARA CHEST DRAIN ATS (WOUND CARE) ×3 IMPLANT
TAPE CLOTH 4X10 WHT NS (GAUZE/BANDAGES/DRESSINGS) ×3 IMPLANT
TAPE CLOTH SURG 4X10 WHT LF (GAUZE/BANDAGES/DRESSINGS) ×1 IMPLANT
TIP APPLICATOR SPRAY EXTEND 16 (VASCULAR PRODUCTS) IMPLANT
TOWEL OR 17X24 6PK STRL BLUE (TOWEL DISPOSABLE) ×6 IMPLANT
TOWEL OR 17X26 10 PK STRL BLUE (TOWEL DISPOSABLE) ×6 IMPLANT
TRAP SPECIMEN MUCOUS 40CC (MISCELLANEOUS) ×3 IMPLANT
TRAY FOLEY CATH 16FRSI W/METER (SET/KITS/TRAYS/PACK) ×3 IMPLANT
TROCAR XCEL BLADELESS 5X75MML (TROCAR) ×1 IMPLANT
TROCAR XCEL NON-BLD 5MMX100MML (ENDOMECHANICALS) IMPLANT
TUBE ANAEROBIC SPECIMEN COL (MISCELLANEOUS) IMPLANT
TUBE CONNECTING 20X1/4 (TUBING) ×3 IMPLANT
TUNNELER SHEATH ON-Q 11GX8 DSP (PAIN MANAGEMENT) IMPLANT
WATER STERILE IRR 1000ML POUR (IV SOLUTION) ×6 IMPLANT

## 2014-11-25 NOTE — Progress Notes (Signed)
Pt transferred from PACU with RN on monitor. PT awake and oriented. Chest tube hooked to suction at 20cm, and no air leaks. VSS, will continue to monitor.

## 2014-11-25 NOTE — Anesthesia Postprocedure Evaluation (Signed)
  Anesthesia Post-op Note  Patient: Melissa Snyder  Procedure(s) Performed: Procedure(s): VIDEO BRONCHOSCOPY WITH ENDOBRONCHIAL ULTRASOUND (N/A) VIDEO ASSISTED THORACOSCOPY (Left) LEFT LOWER LOBE SEGMENTECTOMY (Left) CRYO INTERCOSTAL NERVE BLOCK (Left)  Patient Location: PACU  Anesthesia Type:General  Level of Consciousness: awake and sedated  Airway and Oxygen Therapy: Patient Spontanous Breathing  Post-op Pain: mild  Post-op Assessment: Post-op Vital signs reviewed, Patient's Cardiovascular Status Stable and Respiratory Function Stable              Post-op Vital Signs: stable  Last Vitals:  Filed Vitals:   11/25/14 1300  BP:   Pulse: 62  Temp:   Resp: 13    Complications: No apparent anesthesia complications

## 2014-11-25 NOTE — Progress Notes (Signed)
UR COMPLETED  

## 2014-11-25 NOTE — Anesthesia Preprocedure Evaluation (Signed)
Anesthesia Evaluation  Patient identified by MRN, date of birth, ID band Patient awake    Reviewed: Allergy & Precautions, NPO status , Patient's Chart, lab work & pertinent test results  Airway Mallampati: II   Neck ROM: full    Dental   Pulmonary  Lung mass breath sounds clear to auscultation        Cardiovascular hypertension, + Peripheral Vascular Disease + dysrhythmias Rhythm:Regular Rate:Normal  LBBB   Neuro/Psych  Headaches, Blind in left eye CVA, Residual Symptoms    GI/Hepatic GERD-  ,  Endo/Other  Hypothyroidism   Renal/GU      Musculoskeletal  (+) Arthritis -,   Abdominal   Peds  Hematology   Anesthesia Other Findings   Reproductive/Obstetrics                             Anesthesia Physical Anesthesia Plan  ASA: III  Anesthesia Plan: General   Post-op Pain Management:    Induction: Intravenous  Airway Management Planned: Double Lumen EBT  Additional Equipment: Arterial line  Intra-op Plan:   Post-operative Plan: Extubation in OR  Informed Consent: I have reviewed the patients History and Physical, chart, labs and discussed the procedure including the risks, benefits and alternatives for the proposed anesthesia with the patient or authorized representative who has indicated his/her understanding and acceptance.     Plan Discussed with:   Anesthesia Plan Comments:         Anesthesia Quick Evaluation

## 2014-11-25 NOTE — H&P (View-Only) (Signed)
    [address]       [place] [address]             [phone]       HPI:  Melissa Snyder is a 81-year-old woman with past medical history significant for breast cancer, melanoma, hypertension, hyperlipidemia, left bundle branch block, and blindness in her left eye due to a meningioma.  The lung nodules were initially noted on a chest x-ray done when she was being worked up for meningioma. She had a PET/CT in follow-up in September. The largest of the groundglass opacities had an SUV of only 1.1. There was a subcarinal node that was hypermetabolic with an SUV of 5.8 although it was not enlarged on the scan. She has been followed with scans every 3 months since then. Most recently a scan in April showed that the solid component of the mixed nodule in the superior segment of the left lower lobe was more prominent although the nodule overall had not increased in size. This was concerning for invasive cancer. We discussed the options for diagnosis and treatment. She was interested in surgical resection, but wanted to wait until after she had finished her radiation therapy for breast cancer, which is now complete. When I saw her a couple of weeks ago, she thought she would like to consider radiation. After talking with Dr. Wentworth, she has decided to go ahead with surgery.  She is a lifelong smoker. She has a nonproductive cough mostly at night. She feels well today. She gets SOB with heavy exertion, but does not have any issues with her daily activities. She says that she was told she had a heart murmur by a nurse for her insurance company.  Past Medical History  Diagnosis Date  . Chest pain   . Bundle branch block left   . Migraine   . Hypothyroid   . Hypertension   . Arthritis     OA  . Hyperlipidemia   . GERD (gastroesophageal reflux disease)   . Melanoma in situ of face JUNE 2015    LEFT CHEEK  . Breast cancer 05/2014    ER+/PR+/Her2-  . Family history of breast  cancer   . Family history of bladder cancer   . Family history of prostate cancer   . Blind left eye 10/2013    cva  . Skin cancer    Past Surgical History  Procedure Laterality Date  . Dilation and curettage of uterus      X 2  . Abdominal hysterectomy    . Bilateral salpingoophorectomy    . Bladder repair  2009    cystocele  . Cholecystectomy    . Melanoma excision Left 11/05/23, 11/30/13    CHEEK  . Bunionectomy      bilateral great toe  . Ct radiation therapy guide      Gamma radiation -lt frontal-[facility]  . Cardiac catheterization  2004  . Appendectomy    . Colonoscopy      Current Outpatient Prescriptions  Medication Sig Dispense Refill  . aspirin 81 MG chewable tablet Chew 1 tablet (81 mg total) by mouth daily. 30 tablet 0  . atenolol (TENORMIN) 50 MG tablet Take 50 mg by mouth at bedtime.     . b complex vitamins capsule Take 1 capsule by mouth daily.    . calcium-vitamin D (OSCAL WITH D) 500-200 MG-UNIT per tablet Take 1 tablet by mouth every morning.    . Digestive Enzymes (DIGESTIVE   ENZYME PO) Take 1 capsule by mouth as needed.    . fish oil-omega-3 fatty acids 1000 MG capsule Take 1 g by mouth every morning.     . levothyroxine (SYNTHROID, LEVOTHROID) 112 MCG tablet Take 112 mcg by mouth every morning.     . Multiple Vitamin (MULTIVITAMIN) tablet Take 1 tablet by mouth every morning.     . Multiple Vitamins-Minerals (VISION FORMULA PO) Take 1 capsule by mouth daily.    . vitamin E (E-400) 400 UNIT capsule Take 400 Units by mouth daily.     No current facility-administered medications for this visit.   Family History  Problem Relation Age of Onset  . CAD Mother     CABG  . Hyperlipidemia Mother   . AAA (abdominal aortic aneurysm) Mother   . Renal Disease Father   . Kidney disease Father   . CAD Father   . Cancer Father     PROSTATE  . Diabetes Brother   . Hyperlipidemia Brother   . Breast cancer Daughter 58  . Breast cancer Sister 76  . Breast cancer  Sister 72  . Cancer Brother     NOS  . Bladder Cancer Brother 81  . Breast cancer Cousin     five paternal first cousins  . Lung cancer Cousin     3 paternal cousins with lung cancer  . Cancer Cousin     4 paternal first cousins with Cancer NOS  . Cancer Cousin     1 paternal cousin with oral cancer (tongue)   History   Social History  . Marital Status: Married    Spouse Name: N/A  . Number of Children: 4  . Years of Education: N/A   Occupational History  . Not on file.   Social History Main Topics  . Smoking status: Never Smoker   . Smokeless tobacco: Never Used  . Alcohol Use: No  . Drug Use: No  . Sexual Activity: Not on file   Other Topics Concern  . Not on file   Social History Narrative     Physical Exam BP 168/88 mmHg  Pulse 77  Resp 20  Ht 5' 6" (1.676 m)  Wt 142 lb (64.411 kg)  BMI 22.93 kg/m2  SpO2 98%  81-year-old woman in no acute distress Alert and oriented 3. Cranial nerves II through XII intact. Motor strength 5 out of 5 in all extremities No cervical or subclavicular adenopathy Lungs diminished breath sounds bilaterally, no wheezing or rales Cardiac regular rate and rhythm normal S1 and S2 no rubs murmurs or gallops (no murmur heard on careful auscultation) Abdomen soft and nontender Extremities without clubbing cyanosis or edema  Diagnostic Tests: CT CHEST WITHOUT CONTRAST  TECHNIQUE: Multidetector CT imaging of the chest was performed following the standard protocol without IV contrast.  COMPARISON: Chest CT 05/25/2014 and 02/11/2014. PET-CT 02/23/2014.  FINDINGS: Mediastinum/Nodes: There are no pathologically enlarged mediastinal, hilar, internal mammary or axillary lymph nodes. New surgical clips are present within the right breast. The thyroid gland, trachea and esophagus demonstrate no significant findings. The heart size is normal. There is no pericardial effusion.Atherosclerosis of the aorta, great vessels and  Coronary artery is again noted.  Lungs/Pleura: There is no pleural effusion. There is stable biapical scarring. Scattered right upper lobe ground-glass densities are stable, most prominent on images 17 and 18. There is a stable left upper lobe ground-glass density on image 13. The dominant lesion within the superior segment of the left lower lobe has   not changed in overall size, measuring 16 x 13 mm on image 22. However, it central solid component has enlarged, measuring 9 mm on image 23.  Upper abdomen: Unremarkable. There is no adrenal mass.  Musculoskeletal/Chest wall: There is no chest wall mass or suspicious osseous finding.  IMPRESSION: 1. Further enlargement of the central solid component within the sub solid left lower lobe pulmonary nodule. This finding remains concerning for adenocarcinoma. 2. The additional scattered small ground-glass nodules in both lungs and biapical scarring are stable. 3. No evidence of adenopathy.   Electronically Signed  By: William Veazey M.D.  On: 09/27/2014 13:16  NUCLEAR MEDICINE PET SKULL BASE TO THIGH  TECHNIQUE: 7.3 MCi F-18 FDG was injected intravenously. Full-ring PET imaging was performed from the skull base to thigh after the radiotracer. CT data was obtained and used for attenuation correction and anatomic localization.  FASTING BLOOD GLUCOSE: Value: 90 mg/dl  COMPARISON: CT 02/11/2014  FINDINGS: NECK  No hypermetabolic lymph nodes in the neck.  CHEST  In the superior segment of the left lower lobe there is a 15 mm semi-solid nodule with peripheral ground-glass opacity nodule which is not changed significantly from CT of 11/12/2013. Lesion is very low metabolic activity with SUV max equal 1.1. There are smaller scattered nodules which also do not hypermetabolic.  There is a hypermetabolic subcarinal lymph node with SUV max 5.9. Bilateral hilar lymph nodes are symmetric and mildly  hypermetabolic.  ABDOMEN/PELVIS  No abnormal hypermetabolic activity within the liver, pancreas, adrenal glands, or spleen. No hypermetabolic lymph nodes in the abdomen or pelvis.  SKELETON  No focal hypermetabolic activity to suggest skeletal metastasis.  IMPRESSION: 1. Persistent semi solid nodule in the superior segment left lower lobe with low metabolic activity. Cannot exclude a low-grade adenocarcinoma. Thoracic surgery consultation is recommended. 2. Moderately metabolic subcarinal and hilar lymph nodes are likely reactive. These recommendations are taken from: Recommendations for the Management of Subsolid Pulmonary Nodules Detected at CT: A Statement from the Fleischner Society  Radiology 2013; 266:1, 304-317.   Electronically Signed  By: Stewart Edmunds M.D.  On: 02/23/2014 14:10   I personally reviewed the PET/CT and most recent CT of the chest.  Impression: 81-year-old woman with a mixed solid sub-solid nodule in the superior segment of the left lower lobe. This is highly suspicious for a low-grade adenocarcinoma. It was not hypermetabolic by PET CT back in September 2015, however that is not surprising given its appearance. This is been discussed at the multidisciplinary thoracic oncology conference and we are in favor of treating this lesion. She was offered the option of surgical resection versus biopsy and radiation therapy. We discussed the relative advantages and disadvantages of each of those approaches. She now has decided to proceed with surgery.  She did have a hypermetabolic subcarinal node on that PET/CT. This was not pathologically enlarged. I do think we should do an EBUS at the time of surgery to sample the node before doing a surgical resection.  I described the proposed operative procedure to her. We'll plan to do and endobronchial ultrasound followed by a left video-assisted thoracoscopy and superior segmentectomy. I described the general  nature of the procedure to her and her husband. They understand the need for general anesthesia, the incisions to be used, the need for chest tube drainage postoperatively, the expected hospital stay, and the overall recovery. I reviewed the indications, risks, benefits, and alternatives. She understands the risks include, but are not limited to death, MI, DVT, PE, bleeding,   possible need for transfusion, infection, prolonged air leak, cardiac arrhythmias, chronic pain, as well as the possibility of other unforeseeable complications. We discussed the use of cryo analgesia of the intercostal nerves for assistance with perioperative pain management. She understands the risks and benefits of that as well.   Plan:  EBUS, Left VATS, Left Lower Lobe Superior Segmentectomy, cryo-analgesia of intercostal nerves on Thursday June 23,2016  Melissa Deriso C Aundray Cartlidge, MD [facility] [phone]     

## 2014-11-25 NOTE — Transfer of Care (Signed)
Immediate Anesthesia Transfer of Care Note  Patient: Melissa Snyder  Procedure(s) Performed: Procedure(s): VIDEO BRONCHOSCOPY WITH ENDOBRONCHIAL ULTRASOUND (N/A) VIDEO ASSISTED THORACOSCOPY (Left) LEFT LOWER LOBE SEGMENTECTOMY (Left) CRYO INTERCOSTAL NERVE BLOCK (Left)  Patient Location: PACU  Anesthesia Type:General  Level of Consciousness: patient cooperative and responds to stimulation  Airway & Oxygen Therapy: Patient Spontanous Breathing and Patient connected to face mask oxygen  Post-op Assessment: Report given to RN and Post -op Vital signs reviewed and stable  Post vital signs: Reviewed and stable  Last Vitals:  Filed Vitals:   11/25/14 0545  BP: 172/74  Pulse: 66  Temp: 36.5 C  Resp: 20    Complications: No apparent anesthesia complications

## 2014-11-25 NOTE — Brief Op Note (Addendum)
11/25/2014      [address]       [place] [address]             [phone]     11/25/2014  11:32 AM  PATIENT:  Melissa Snyder  81 y.o. female  PRE-OPERATIVE DIAGNOSIS:  LLL NODULE  POST-OPERATIVE DIAGNOSIS:  LLL NODULE  PROCEDURE:  Procedure(s): VIDEO BRONCHOSCOPY WITH ENDOBRONCHIAL ULTRASOUND VIDEO ASSISTED THORACOSCOPY THORACOSCOPIC LEFT LOWER LOBE SUPERIOR SEGMENTECTOMY LYMPH NODE DISSECTION CRYO INTERCOSTAL NERVE BLOCK  SURGEON:  Surgeon(s): [name], MD  PHYSICIAN ASSISTANT: WAYNE GOLD PA-C  ANESTHESIA:   general  SPECIMEN:  Source of Specimen:  AS ABOVE  DISPOSITION OF SPECIMEN:  Pathology  DRAINS: 1 Chest Tube(s) in the LEFT HEMITHORAX   PATIENT CONDITION:  PACU - hemodynamically stable.  PRE-OPERATIVE WEIGHT: 62kg  EBL: 150 ML  LEVEL 7 NODE ASPIRATION- FEW ATYPICAL CELLS, NO MALIGNANCY SEEN  FROZEN : WELL DIFFERENTIATED CARCINOMA  COMPLICATIONS: NO KNOWN

## 2014-11-25 NOTE — Anesthesia Procedure Notes (Addendum)
Anesthesia Procedure Note .chj Anesthesia Procedure Note CVP: Timeout, sterile prep, drape, FBP R neck.  Trendelenburg position.  1% lido local, finder and trocar RIJ 1st pass with US guidance.  2 lumen placed over J wire. Biopatch and sterile dressing on.  Patient tolerated well.  VSS. Seward Speck, MD Procedure Name: Intubation Performed by: Verdie Drown Pre-anesthesia Checklist: Patient identified, Emergency Drugs available, Suction available, Patient being monitored and Timeout performed Patient Re-evaluated:Patient Re-evaluated prior to inductionOxygen Delivery Method: Circle system utilized Preoxygenation: Pre-oxygenation with 100% oxygen Intubation Type: IV induction Ventilation: Mask ventilation without difficulty Laryngoscope Size: Miller and 2 Grade View: Grade II Tube type: Oral Number of attempts: 2 Airway Equipment and Method: Stylet Placement Confirmation: ETT inserted through vocal cords under direct vision,  breath sounds checked- equal and bilateral and positive ETCO2 Secured at: 22 cm Tube secured with: Tape Dental Injury: Injury to lip  Comments: DL x 1 w MAC 3, G4 view; DL w Mil 2 for G2 view, atraumatic intubation.  L upper lip w small injury 2* 1st DL, ointment applied.   Anesthesia Procedure Note .chj Anesthesia Procedure Note CVP: Timeout, sterile prep, drape, FBP R neck.  Trendelenburg position.  1% lido local, finder and trocar RIJ 1st pass with US guidance.  2 lumen placed over J wire. Biopatch and sterile dressing on.  Patient tolerated well.  VSS. Seward Speck, MD Procedure Name: Intubation Performed by: Verdie Drown Pre-anesthesia Checklist: Patient identified, Emergency Drugs available, Suction available, Patient being monitored and Timeout performed Patient Re-evaluated:Patient Re-evaluated prior to inductionOxygen Delivery Method: Circle system utilized Preoxygenation: Pre-oxygenation with 100% oxygen Intubation Type: Combination inhalational/  intravenous induction Laryngoscope Size: Mac and 3 (per MDA) Grade View: Grade I Endobronchial tube: Left, Double lumen EBT, EBT position confirmed by auscultation and EBT position confirmed by fiberoptic bronchoscope and 37 Fr Number of attempts: 1 Airway Equipment and Method: Stylet and Video-laryngoscopy Placement Confirmation: ETT inserted through vocal cords under direct vision,  breath sounds checked- equal and bilateral and positive ETCO2 Tube secured with: Tape Dental Injury: Teeth and Oropharynx as per pre-operative assessment

## 2014-11-25 NOTE — Care Management Note (Signed)
Case Management Note  Patient Details  Name: SHANTORIA ELLWOOD MRN: [id number] Date of Birth: [birth year]  Subjective/Objective:                 Pt from home admitted with LLL nodule, s/ p VAT'S procedure.  Action/Plan: Return to home when medically stable. CM to f/u d/c needs.  Expected Discharge Date:                  Expected Discharge Plan:  Home/Self Care  In-House Referral:     Discharge planning Services  CM Consult  Post Acute Care Choice:    Choice offered to:     DME Arranged:    DME Agency:     HH Arranged:    HH Agency:     Status of Service:  In process, will continue to follow  Medicare Important Message Given:    Date Medicare IM Given:    Medicare IM give by:    Date Additional Medicare IM Given:    Additional Medicare Important Message give by:     If discussed at [place] of Stay Meetings, dates discussed:    Additional CommentsKennith Center Wenzl(spouse), [phone] Sharin Mons, RN 11/25/2014, 9:36 PM

## 2014-11-25 NOTE — Interval H&P Note (Signed)
History and Physical Interval Note:  11/25/2014 7:53 AM  [name]  has presented today for surgery, with the diagnosis of LLL NODULE  The various methods of treatment have been discussed with the patient and family. After consideration of risks, benefits and other options for treatment, the patient has consented to  Procedure(s): VIDEO BRONCHOSCOPY WITH ENDOBRONCHIAL ULTRASOUND (N/A) VIDEO ASSISTED THORACOSCOPY (Left) LEFT LOWER LOBE SEGMENTECTOMY (Left) CRYO INTERCOSTAL NERVE BLOCK (N/A) as a surgical intervention .  The patient's history has been reviewed, patient examined, no change in status, stable for surgery.  I have reviewed the patient's chart and labs.  Questions were answered to the patient's satisfaction.     [name]

## 2014-11-26 ENCOUNTER — Encounter (HOSPITAL_COMMUNITY): Payer: Self-pay | Admitting: Thoracic Surgery (Cardiothoracic Vascular Surgery)

## 2014-11-26 ENCOUNTER — Inpatient Hospital Stay (HOSPITAL_COMMUNITY): Payer: Medicare HMO

## 2014-11-26 LAB — BLOOD GAS, ARTERIAL
Acid-Base Excess: 1.2 mmol/L (ref 0.0–2.0)
Bicarbonate: 25.3 mEq/L — ABNORMAL HIGH (ref 20.0–24.0)
Drawn by: 3503
O2 Content: 3 L/min
O2 Saturation: 99.6 %
PATIENT TEMPERATURE: 98.6
PCO2 ART: 40.9 mmHg (ref 35.0–45.0)
PH ART: 7.409 (ref 7.350–7.450)
TCO2: 26.6 mmol/L (ref 0–100)
pO2, Arterial: 159 mmHg — ABNORMAL HIGH (ref 80.0–100.0)

## 2014-11-26 LAB — CBC
HCT: 30.9 % — ABNORMAL LOW (ref 36.0–46.0)
HEMOGLOBIN: 10.2 g/dL — AB (ref 12.0–15.0)
MCH: 28.3 pg (ref 26.0–34.0)
MCHC: 33 g/dL (ref 30.0–36.0)
MCV: 85.8 fL (ref 78.0–100.0)
PLATELETS: 154 10*3/uL (ref 150–400)
RBC: 3.6 MIL/uL — ABNORMAL LOW (ref 3.87–5.11)
RDW: 14.2 % (ref 11.5–15.5)
WBC: 8.4 10*3/uL (ref 4.0–10.5)

## 2014-11-26 LAB — BASIC METABOLIC PANEL
ANION GAP: 9 (ref 5–15)
BUN: 10 mg/dL (ref 6–20)
CALCIUM: 8.4 mg/dL — AB (ref 8.9–10.3)
CHLORIDE: 105 mmol/L (ref 101–111)
CO2: 26 mmol/L (ref 22–32)
Creatinine, Ser: 0.5 mg/dL (ref 0.44–1.00)
GLUCOSE: 145 mg/dL — AB (ref 65–99)
POTASSIUM: 4.2 mmol/L (ref 3.5–5.1)
Sodium: 140 mmol/L (ref 135–145)

## 2014-11-26 MED ORDER — PANTOPRAZOLE SODIUM 40 MG PO TBEC: 40.0000 mg | DELAYED_RELEASE_TABLET | Freq: Every day | ORAL | Status: DC

## 2014-11-26 MED ORDER — OMEPRAZOLE 20 MG PO CPDR
40.0000 mg | DELAYED_RELEASE_CAPSULE | Freq: Two times a day (BID) | ORAL | Status: DC
  Filled 2014-11-26 (×3): qty 2

## 2014-11-26 MED ORDER — ALBUTEROL SULFATE (2.5 MG/3ML) 0.083% IN NEBU
2.5000 mg | INHALATION_SOLUTION | Freq: Four times a day (QID) | RESPIRATORY_TRACT | Status: DC
  Administered 2014-11-26 – 2014-11-29 (×12): 2.5 mg via RESPIRATORY_TRACT
  Filled 2014-11-26 (×13): qty 3

## 2014-11-26 MED ORDER — SODIUM CHLORIDE 0.45 % IV SOLN
INTRAVENOUS | Status: DC
  Administered 2014-11-26 – 2014-11-27 (×2): via INTRAVENOUS

## 2014-11-26 MED ORDER — ENOXAPARIN SODIUM 30 MG/0.3ML ~~LOC~~ SOLN
30.0000 mg | SUBCUTANEOUS | Status: DC
  Administered 2014-11-26 – 2014-11-27 (×2): 30 mg via SUBCUTANEOUS
  Filled 2014-11-26 (×4): qty 0.3

## 2014-11-26 MED ORDER — PANTOPRAZOLE SODIUM 40 MG PO TBEC
40.0000 mg | DELAYED_RELEASE_TABLET | Freq: Two times a day (BID) | ORAL | Status: DC
  Administered 2014-11-26 – 2014-11-28 (×6): 40 mg via ORAL
  Filled 2014-11-26 (×7): qty 1

## 2014-11-26 NOTE — Progress Notes (Signed)
1 Day Post-Op Procedure(s) (LRB): VIDEO BRONCHOSCOPY WITH ENDOBRONCHIAL ULTRASOUND (N/A) VIDEO ASSISTED THORACOSCOPY (Left) LEFT LOWER LOBE SEGMENTECTOMY (Left) CRYO INTERCOSTAL NERVE BLOCK (Left) Subjective: Some pain under left arm No nausea  Objective: Vital signs in last 24 hours: Temp:  [96.9 F (36.1 C)-98.1 F (36.7 C)] 97.5 F (36.4 C) (06/24 0346) Pulse Rate:  [55-79] 63 (06/24 0346) Cardiac Rhythm:  [-] Normal sinus rhythm (06/24 0346) Resp:  [10-21] 17 (06/24 0400) BP: (117-197)/(47-71) 117/48 mmHg (06/24 0346) SpO2:  [93 %-100 %] 100 % (06/24 0346) Arterial Line BP: (148-220)/(49-80) 178/73 mmHg (06/24 0020) Weight:  [151 lb 0.2 oz (68.5 kg)] 151 lb 0.2 oz (68.5 kg) (06/23 1740)  Hemodynamic parameters for last 24 hours:    Intake/Output from previous day: 06/23 0701 - 06/24 0700 In: 2223.3 [P.O.:240; I.V.:1883.3; IV Piggyback:100] Out: 2485 [Urine:2315; Chest Tube:170] Intake/Output this shift:    General appearance: alert, cooperative and no distress Neurologic: intact Heart: regular rate and rhythm Lungs: diminished breath sounds bibasilar Abdomen: normal findings: soft, non-tender serosanguinous drainage from CT, no air leak  Lab Results:  Recent Labs  11/23/14 1302 11/26/14 0403  WBC 4.9 8.4  HGB 11.8* 10.2*  HCT 35.4* 30.9*  PLT 161 154   BMET:  Recent Labs  11/23/14 1302 11/26/14 0403  NA 138 140  K 4.1 4.2  CL 104 105  CO2 24 26  GLUCOSE 135* 145*  BUN 17 10  CREATININE 0.53 0.50  CALCIUM 8.9 8.4*    PT/INR:  Recent Labs  11/23/14 1302  LABPROT 14.2  INR 1.08   ABG    Component Value Date/Time   PHART 7.409 11/26/2014 0356   HCO3 25.3* 11/26/2014 0356   TCO2 26.6 11/26/2014 0356   O2SAT 99.6 11/26/2014 0356   CBG (last 3)  No results for input(s): GLUCAP in the last 72 hours.  Assessment/Plan: S/P Procedure(s) (LRB): VIDEO BRONCHOSCOPY WITH ENDOBRONCHIAL ULTRASOUND (N/A) VIDEO ASSISTED THORACOSCOPY (Left) LEFT  LOWER LOBE SEGMENTECTOMY (Left) CRYO INTERCOSTAL NERVE BLOCK (Left) POD # 1  Doing well  CV- stable  RESP_ CXR looks good, no air leak- CT to water seal  IS  RENAL- creatinine and lytes OK  Advance diet as tolerated  Ambulation  SCD + enoxaparin for DVT prophylaxis   LOS: 1 day    [name] 11/26/2014

## 2014-11-26 NOTE — Op Note (Signed)
NAMELEILANEE, RIGHETTI NO.:  [id number]  MEDICAL RECORD NO.:  [id number]  LOCATION:  3S03C                        FACILITY:  [facility]  PHYSICIAN:  Revonda Standard. Roxan Hockey, M.D.DATE OF BIRTH:  1933/12/29  DATE OF PROCEDURE:  11/25/2014 DATE OF DISCHARGE:                              OPERATIVE REPORT   PREOPERATIVE DIAGNOSIS:  Left lower lobe lung nodule.  POSTOPERATIVE DIAGNOSIS:  Non-small cell carcinoma, left lower lobe, clinical stage IA.  PROCEDURE:   Bronchoscopy Endobronchial ultrasound with mediastinal lymph node aspiration Left video-assisted thoracoscopy Thoracoscopic left lower lobe superior segmentectomy Mediastinal lymph node dissection Cryoanalgesia of intercostal nerves 3 through 7.  SURGEON:  Revonda Standard. Roxan Hockey, M.D.  ASSISTANT:  John Giovanni, P.A.-C.  ANESTHESIA:  General.  FINDINGS:  Bronchoscopy:  Normal endobronchial anatomy and no endobronchial lesions.  Relatively sharp main carina.  Endobronchial ultrasound:  Normal appearing level 7 node.  Multiple aspirations performed, 3 of 4 aspirations bloody.  Cytology revealed a few atypical cells.  No malignancy.  Thoracoscopy:  Superior segmentectomy performed for left lower lobe nodule.  Frozen section revealed well-differentiated adenocarcinoma, margins clear.  CLINICAL NOTE:  Melissa Snyder is a 81 year old woman with a history of breast cancer, melanoma and meningioma.  She had a PET-CT in September 2015, which showed a subcarinal node that was hypermetabolic, but not enlarged.  There was a ground-glass nodule in the superior segment of the left lower lobe that was not hypermetabolic.  After discussion, she decided she would like to be followed radiographically.  Over time, the left lower lobe nodule became more prominent and developed a solid component, although it did not increase significantly in size.  She was undergoing radiation therapy for breast cancer and wanted to wait  until that was complete before having surgical resection.  At that point, she considered radiation to the lung nodule, but after further discussion, she decided to proceed with surgery.  I recommended that we do bronchoscopy and endobronchial ultrasound based on her previous PET-CT findings before proceeding to thoracoscopy to resect the lung nodule. The indications, risks, benefits, alternatives, and intraoperative decision making were discussed with the patient.  She understood and accepted the risks and agreed to proceed.  OPERATIVE NOTE:  [name] was brought to the operating room on November 25, 2014.  She had induction of general anesthesia and was intubated. Sequential compressive devices were placed on the calves for DVT prophylaxis.  A Foley catheter was placed.  Intravenous antibiotics were administered.  Flexible fiberoptic bronchoscopy was performed via the endotracheal tube.  There was a relatively steep and sharp carina, but otherwise normal endobronchial anatomy and no endobronchial lesions were seen to the level of the subsegmental bronchi.  The endobronchial ultrasound probe then was advanced.  There was a node in the subcarinal space that appeared relatively normal in size. Multiple aspirations were performed, 2 approaching from the left side and 2 approaching from the right side.  The first 3 aspirations all were bloody.  The final aspiration did not have any blood.  A portion of each aspiration was placed on slides for immediate preparation.  The remainder was placed in cytologic fluid for permanent cytology.  The quick preps showed a few atypical cells, but no malignancy.  Decision was made to proceed with superior segmentectomy as discussed with the patient preoperatively.  She was reintubated with a double-lumen endotracheal tube.  She was placed in a right lateral decubitus position and the left chest was prepped and draped in the usual sterile fashion.  Single  lung ventilation of the right lung was initiated and was tolerated well throughout the procedure.  An incision was made in the seventh intercostal space in the midaxillary line.  The chest was entered bluntly using a 5 mm port and the thoracoscope was advanced into the chest.  There was no pleural effusion and no abnormality of the visceral or parietal pleura was noted.  A small working incision, 5 cm in length, was made in the fourth interspace anterolaterally.  No rib spreading was performed during the procedure.  Cryoanalgesia of intercostal nerves 3 through 7 was performed.  A 2 cm segment of the cryoprobe was placed over the nerve and taken to -70 degrees Celsius for 2 minutes at each level.  The inferior pulmonary ligament was divided with electrocautery and the pleural reflection was divided at the hilum posteriorly.  Dissection was begun in the fissure.  Once the pulmonary artery was identified, the fissure was completed posteriorly with sequential firings of an endoscopic GIA stapler.  As lymph nodes were encountered during the procedure, they were removed and sent as separate specimens for permanent pathology.  The superior segmental artery branch was identified, and surrounding nodes were dissected out.  The artery then was encircled and divided with an endoscopic vascular stapler.  Dissection then was carried posteriorly and the lower lobe bronchus was identified.  The superior segmental bronchus was identified, surrounding nodes were removed and the endoscopic stapler was placed across the bronchus and closed, but not fired.  A test inflation showed good aeration of the upper lobe as well as the basilar segments of the lower lobe.  The stapler then was fired transecting the superior segmental bronchus. There was good demarcation  of the superior segment from the test inflation and the superior segmentectomy was completed with sequential firings of an endoscopic GIA stapler.   The superior segment was placed into an endoscopic retrieval bag, removed, and sent for frozen section of the mass and margins.  The mass turned out to be a well-differentiated adenocarcinoma.  The margins were clear.  The subcarinal space was explored.  We were unable to get all the way to the carina, but a small node that appeared relatively normal was taken for pathology.  The chest was copiously irrigated with warm saline.  A test inflation to 30 cm of water revealed no air leak from the staple line or bronchial stump.  A 28-French chest tube was placed through the original port incision and secured with a #1 silk suture.  The lung was reinflated.  The incision was closed with a running #1 Vicryl fascial suture, 2-0 Vicryl subcutaneous suture and 3-0 Vicryl subcuticular Suture.  The patient was placed back in a supine position.  She was extubated in the operating room and taken to the postanesthetic care unit in good condition.  All sponge, needle, and instrument counts were correct at the end of the procedure.     Revonda Standard Roxan Hockey, M.D.     SCH/MEDQ  D:  11/26/2014  T:  11/26/2014  Job:  [id number]

## 2014-11-27 ENCOUNTER — Inpatient Hospital Stay (HOSPITAL_COMMUNITY): Payer: Medicare HMO

## 2014-11-27 LAB — CBC
HEMATOCRIT: 31.5 % — AB (ref 36.0–46.0)
Hemoglobin: 10.3 g/dL — ABNORMAL LOW (ref 12.0–15.0)
MCH: 28.6 pg (ref 26.0–34.0)
MCHC: 32.7 g/dL (ref 30.0–36.0)
MCV: 87.5 fL (ref 78.0–100.0)
PLATELETS: 142 10*3/uL — AB (ref 150–400)
RBC: 3.6 MIL/uL — AB (ref 3.87–5.11)
RDW: 14.7 % (ref 11.5–15.5)
WBC: 7 10*3/uL (ref 4.0–10.5)

## 2014-11-27 LAB — COMPREHENSIVE METABOLIC PANEL
ALBUMIN: 2.7 g/dL — AB (ref 3.5–5.0)
ALT: 42 U/L (ref 14–54)
AST: 34 U/L (ref 15–41)
Alkaline Phosphatase: 79 U/L (ref 38–126)
Anion gap: 6 (ref 5–15)
BUN: 6 mg/dL (ref 6–20)
CHLORIDE: 107 mmol/L (ref 101–111)
CO2: 29 mmol/L (ref 22–32)
Calcium: 8.5 mg/dL — ABNORMAL LOW (ref 8.9–10.3)
Creatinine, Ser: 0.54 mg/dL (ref 0.44–1.00)
Glucose, Bld: 108 mg/dL — ABNORMAL HIGH (ref 65–99)
POTASSIUM: 3.7 mmol/L (ref 3.5–5.1)
Sodium: 142 mmol/L (ref 135–145)
TOTAL PROTEIN: 5.4 g/dL — AB (ref 6.5–8.1)
Total Bilirubin: 0.8 mg/dL (ref 0.3–1.2)

## 2014-11-27 MED ORDER — PANCRELIPASE (LIP-PROT-AMYL) 12000-38000 UNITS PO CPEP
24000.0000 [IU] | ORAL_CAPSULE | Freq: Every day | ORAL | Status: DC
  Administered 2014-11-27 – 2014-11-29 (×3): 24000 [IU] via ORAL
  Filled 2014-11-27 (×4): qty 2

## 2014-11-27 MED ORDER — DIGESTIVE ENZYME PO CAPS: 1.0000 | ORAL_CAPSULE | Freq: Every day | ORAL | Status: DC

## 2014-11-27 MED ORDER — ENOXAPARIN SODIUM 40 MG/0.4ML ~~LOC~~ SOLN
40.0000 mg | SUBCUTANEOUS | Status: DC
  Administered 2014-11-28: 40 mg via SUBCUTANEOUS
  Filled 2014-11-27 (×2): qty 0.4

## 2014-11-27 NOTE — Progress Notes (Signed)
2 Days Post-Op Procedure(s) (LRB): VIDEO BRONCHOSCOPY WITH ENDOBRONCHIAL ULTRASOUND (N/A) VIDEO ASSISTED THORACOSCOPY (Left) LEFT LOWER LOBE SEGMENTECTOMY (Left) CRYO INTERCOSTAL NERVE BLOCK (Left) Subjective: Doesn't feel as well today Appetite poor  Objective: Vital signs in last 24 hours: Temp:  [98 F (36.7 C)-98.9 F (37.2 C)] 98.6 F (37 C) (06/25 0700) Pulse Rate:  [61-80] 61 (06/25 0750) Cardiac Rhythm:  [-] Normal sinus rhythm (06/25 0400) Resp:  [18-26] 26 (06/25 0750) BP: (143-162)/(49-70) 162/58 mmHg (06/25 0750) SpO2:  [96 %-99 %] 98 % (06/25 0826)  Hemodynamic parameters for last 24 hours:    Intake/Output from previous day: 06/24 0701 - 06/25 0700 In: 2415 [P.O.:840; I.V.:1350] Out: 4165 [Urine:3875; Chest Tube:290] Intake/Output this shift:    General appearance: cooperative and no distress Neurologic: intact Heart: regular rate and rhythm Lungs: diminished breath sounds bibasilar no air leak  Lab Results:  Recent Labs  11/26/14 0403 11/27/14 0452  WBC 8.4 7.0  HGB 10.2* 10.3*  HCT 30.9* 31.5*  PLT 154 142*   BMET:  Recent Labs  11/26/14 0403 11/27/14 0452  NA 140 142  K 4.2 3.7  CL 105 107  CO2 26 29  GLUCOSE 145* 108*  BUN 10 6  CREATININE 0.50 0.54  CALCIUM 8.4* 8.5*    PT/INR: No results for input(s): LABPROT, INR in the last 72 hours. ABG    Component Value Date/Time   PHART 7.409 11/26/2014 0356   HCO3 25.3* 11/26/2014 0356   TCO2 26.6 11/26/2014 0356   O2SAT 99.6 11/26/2014 0356   CBG (last 3)  No results for input(s): GLUCAP in the last 72 hours.  Assessment/Plan: S/P Procedure(s) (LRB): VIDEO BRONCHOSCOPY WITH ENDOBRONCHIAL ULTRASOUND (N/A) VIDEO ASSISTED THORACOSCOPY (Left) LEFT LOWER LOBE SEGMENTECTOMY (Left) CRYO INTERCOSTAL NERVE BLOCK (Left) POD # 2  CV stable  RESP- no air leak, trace pneumo on CXR- dc chest tube  RENAL- lytes, creatinine Ok  Anemia secondary to ABL- mild, follow  Continue PCA for  pain control  Continue IVF until PO intake improves   LOS: 2 days    Melissa Snyder 11/27/2014

## 2014-11-28 ENCOUNTER — Inpatient Hospital Stay (HOSPITAL_COMMUNITY): Payer: Medicare HMO

## 2014-11-28 LAB — CBC
HCT: 33.5 % — ABNORMAL LOW (ref 36.0–46.0)
HEMOGLOBIN: 11.1 g/dL — AB (ref 12.0–15.0)
MCH: 28.7 pg (ref 26.0–34.0)
MCHC: 33.1 g/dL (ref 30.0–36.0)
MCV: 86.6 fL (ref 78.0–100.0)
PLATELETS: 176 10*3/uL (ref 150–400)
RBC: 3.87 MIL/uL (ref 3.87–5.11)
RDW: 14.6 % (ref 11.5–15.5)
WBC: 7 10*3/uL (ref 4.0–10.5)

## 2014-11-28 LAB — BASIC METABOLIC PANEL
ANION GAP: 5 (ref 5–15)
BUN: <5 mg/dL — ABNORMAL LOW (ref 6–20)
CO2: 30 mmol/L (ref 22–32)
Calcium: 8.7 mg/dL — ABNORMAL LOW (ref 8.9–10.3)
Chloride: 107 mmol/L (ref 101–111)
Creatinine, Ser: 0.49 mg/dL (ref 0.44–1.00)
GFR calc Af Amer: >60 mL/min (ref >60)
GFR calc non Af Amer: >60 mL/min (ref >60)
Glucose, Bld: 113 mg/dL — ABNORMAL HIGH (ref 65–99)
POTASSIUM: 4.1 mmol/L (ref 3.5–5.1)
SODIUM: 142 mmol/L (ref 135–145)

## 2014-11-28 MED ORDER — HYDRALAZINE HCL 20 MG/ML IJ SOLN
10.0000 mg | Freq: Four times a day (QID) | INTRAMUSCULAR | Status: DC | PRN
  Administered 2014-11-28: 10 mg via INTRAVENOUS
  Filled 2014-11-28: qty 1

## 2014-11-28 MED ORDER — LISINOPRIL 5 MG PO TABS
5.0000 mg | ORAL_TABLET | Freq: Every day | ORAL | Status: DC
  Administered 2014-11-28 – 2014-11-29 (×2): 5 mg via ORAL
  Filled 2014-11-28 (×2): qty 1

## 2014-11-28 MED ORDER — OXYCODONE HCL 5 MG PO TABS
5.0000 mg | ORAL_TABLET | ORAL | Status: DC | PRN
  Administered 2014-11-28 – 2014-11-29 (×4): 5 mg via ORAL
  Filled 2014-11-28 (×4): qty 1

## 2014-11-28 NOTE — Progress Notes (Signed)
Wasted 54m of Fentanyl PCA with CPecolia Ades RN

## 2014-11-28 NOTE — Progress Notes (Addendum)
      [address]       [place] [address]             [phone]      3 Days Post-Op Procedure(s) (LRB): VIDEO BRONCHOSCOPY WITH ENDOBRONCHIAL ULTRASOUND (N/A) VIDEO ASSISTED THORACOSCOPY (Left) LEFT LOWER LOBE SEGMENTECTOMY (Left) CRYO INTERCOSTAL NERVE BLOCK (Left)   Subjective:  Ms. Clouatre states she doesn't feel very good.  She states she had muscle spasms throughout the night and did not get much sleep.  Objective: Vital signs in last 24 hours: Temp:  [97.9 F (36.6 C)-98.9 F (37.2 C)] 98 F (36.7 C) (06/26 0500) Pulse Rate:  [70-81] 70 (06/26 0738) Cardiac Rhythm:  [-] Normal sinus rhythm (06/26 0738) Resp:  [17-24] 17 (06/26 0738) BP: (140-186)/(57-74) 186/74 mmHg (06/26 0738) SpO2:  [96 %-99 %] 96 % (06/26 0738)  Intake/Output from previous day: 06/25 0701 - 06/26 0700 In: 2396.7 [P.O.:800; I.V.:1396.7] Out: 200 [Urine:200] Intake/Output this shift: Total I/O In: 81.7 [I.V.:81.7] Out: -   General appearance: alert, cooperative and no distress Heart: regular rate and rhythm Lungs: diminished breath sounds bibasilar Abdomen: soft, non-tender; bowel sounds normal; no masses,  no organomegaly Wound: clean and dry  Lab Results:  Recent Labs  11/27/14 0452 11/28/14 0400  WBC 7.0 7.0  HGB 10.3* 11.1*  HCT 31.5* 33.5*  PLT 142* 176   BMET:  Recent Labs  11/27/14 0452 11/28/14 0400  NA 142 142  K 3.7 4.1  CL 107 107  CO2 29 30  GLUCOSE 108* 113*  BUN 6 <5*  CREATININE 0.54 0.49  CALCIUM 8.5* 8.7*    PT/INR: No results for input(s): LABPROT, INR in the last 72 hours. ABG    Component Value Date/Time   PHART 7.409 11/26/2014 0356   HCO3 25.3* 11/26/2014 0356   TCO2 26.6 11/26/2014 0356   O2SAT 99.6 11/26/2014 0356   CBG (last 3)  No results for input(s): GLUCAP in the last 72 hours.  Assessment/Plan: S/P Procedure(s) (LRB): VIDEO BRONCHOSCOPY WITH ENDOBRONCHIAL ULTRASOUND (N/A) VIDEO ASSISTED THORACOSCOPY  (Left) LEFT LOWER LOBE SEGMENTECTOMY (Left) CRYO INTERCOSTAL NERVE BLOCK (Left)  1. Pulm- off oxygen, chest tube removed yesterday- CXR remains stable, continue IS for atelectasis 2. CV- Hypertension, most likely related to her pain control, will add Hydralazine prn with parameters 3. Renal- creatinine elevated at 1.96, will repeat BMET in AM 4. Pain control- will d/c PCA today, started Oxy IR for pain control- patient willing to try, however does not want any hydrocodone due to sensitivity  5. Dispo- patient stable, CXR without significant pneumothorax, will start Oxy IR for pain control, d/c PCA... Possibly home in AM   LOS: 3 days    BARRETT, ERIN 11/28/2014  Patient seen and examined, Her creatinine is 0.49. Will add lisinopril.  Revonda Standard Roxan Hockey, MD [facility] [phone]

## 2014-11-28 NOTE — Discharge Summary (Signed)
Physician Discharge Summary  Patient ID: Melissa Snyder MRN: [id number] DOB/AGE: 1934-03-10 81 y.o.  Admit date: 11/25/2014 Discharge date: 11/28/2014  Admission Diagnoses: Left lower lobe lung nodule  Patient Active Problem List   Diagnosis Date Noted  . Lung cancer 11/25/2014  . Genetic testing 07/01/2014  . Family history of breast cancer   . Family history of bladder cancer   . Family history of prostate cancer   . Breast cancer 05/04/2014  . Hypothyroid   . Hypertension   . Arthritis   . Hyperlipidemia 01/12/2014  . Melanoma in situ of face 11/02/2013  . Central retinal artery occlusion of left eye 10/24/2013  . HTN (hypertension) 10/24/2013  . Hypothyroidism 10/24/2013  . GERD (gastroesophageal reflux disease) 10/24/2013   Discharge Diagnoses: Stage IA adenocarcinoma left lower lobe  Patient Active Problem List   Diagnosis Date Noted  . Lung cancer 11/25/2014  . Genetic testing 07/01/2014  . Family history of breast cancer   . Family history of bladder cancer   . Family history of prostate cancer   . Breast cancer 05/04/2014  . Hypothyroid   . Hypertension   . Arthritis   . Hyperlipidemia 01/12/2014  . Melanoma in situ of face 11/02/2013  . Central retinal artery occlusion of left eye 10/24/2013  . HTN (hypertension) 10/24/2013  . Hypothyroidism 10/24/2013  . 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  The patient was undergoing work up for her Meningioma and CXR revealed the presence of lung nodules.  Further workup with PET CT scan in September showed evidence of a subcarinal lymph node which was hypermetabolic.  The pulmonary nodules were minimally active.  Since that time the patient has been routinely followed every 3  months.  Repeat scan done in April showed the solid component of the nodule in the superior segment of the left lower lobe was more prominent however the lesion had not increased in size.  There was concern this could be invasive cancer.  It was felt at that time she should continue her radiation therapy and once this was complete, surgical intervention should be performed.  Her most recent appointment with Dr. Roxan Hockey was 11/09/2014.  At that time various surgical options were discussed with the patient and her husband.  She ultimately decided to proceed with EBUS and VATS procedure.  The risks and benefits of the procedure were explained to the patient and she was agreeable to proceed.    Hospital Course:   Melissa Snyder presented to [facility] on 11/25/2014.  She was taken to the operating room and underwent Video Bronchoscopy with Endobronchial Ultrasoun, Left VATS with resection of the superior segment of the left lower lobe, Lymph node sampling, and Cryo Intercostal nerve block.  She tolerated the procedure, was extubated and taken to the PACU in stable condition.  The patient has progressed slowly from surgery.  She has had issues with pain control and muscle spasms.  Her chest tubes did not exhibit evidence of air leak and were transitioned to water seal on POD #1.  Her CXR showed evidence of a tiny pneumothorax, but there was no evidence of air leak.  Her chest tube was removed on POD #2.  Follow up CXR was free from significant pneumothorax.  Her PCA has  been discontinued.  She has been started on oral pain medication.  She is ambulating.  She is tolerating a regular diet.  She is felt to be medically stable for discharge on 11/29/2014. Preliminary pathology was Non-Small Cell Lung Cancer, but final Pathology remains pending.         Treatments: surgery:   Bronchoscopy, endobronchial ultrasound with mediastinal lymph node aspiration, left video-assisted thoracoscopy, thoracoscopic left lower  lobe superior segmentectomy, mediastinal lymph node dissection, cryoanalgesia of intercostal nerves 3 through 7.  Disposition: 01-Home or Self Care   Medication List    TAKE these medications        aspirin 81 MG chewable tablet  Chew 1 tablet (81 mg total) by mouth daily.     atenolol 50 MG tablet  Commonly known as:  TENORMIN  Take 50 mg by mouth at bedtime.     b complex vitamins capsule  Take 1 capsule by mouth daily.     BEANO PO  Take 2 capsules by mouth 2 (two) times daily as needed (ONLY TAKES WHEN TRAVELING  - takes in place of digestive enzymes).     calcium-vitamin D 500-200 MG-UNIT per tablet  Commonly known as:  OSCAL WITH D  Take 1 tablet by mouth every morning.     DIGESTIVE ENZYME PO  Take 1 capsule by mouth daily.     E-400 400 UNIT capsule  Generic drug:  vitamin E  Take 400 Units by mouth 3 (three) times a week. Mon Wed Fri     fish oil-omega-3 fatty acids 1000 MG capsule  Take 1 g by mouth every morning.     levothyroxine 112 MCG tablet  Commonly known as:  SYNTHROID, LEVOTHROID  Take 112 mcg by mouth every morning.     lisinopril 10 MG tablet  Commonly known as:  PRINIVIL,ZESTRIL  Take 1 tablet (10 mg total) by mouth daily.     multivitamin tablet  Take 1 tablet by mouth every morning.     omeprazole 20 MG capsule  Commonly known as:  PRILOSEC  Take 20 mg by mouth daily. With evening meal     oxyCODONE 5 MG immediate release tablet  Commonly known as:  Oxy IR/ROXICODONE  Take 1-2 tablets (5-10 mg total) by mouth every 4 (four) hours as needed for moderate pain.     VISION FORMULA PO  Take 1 capsule by mouth daily.       Follow-up Information    Follow up with [name], MD In 2 weeks.   Specialty:  Cardiothoracic Surgery   Why:  office will contact with appointment   Contact information:   [place] [address] [phone]       Follow up with [facility] In 2 weeks.   Why:   Please get CXR 1 hour prior to your appointment with Dr. Ollen Gross information:   [facility]       Follow up with [facility].   Why:  chest tube suture removal by the nurse, they will contact you   Contact information:   [place] [phone]      Signed: Ellwood Handler 11/28/2014, 11:07 AM

## 2014-11-28 NOTE — Discharge Instructions (Signed)
Video-Assisted Thoracic Surgery °Care After °Refer to this sheet in the next few weeks. These instructions provide you with information on caring for yourself after your procedure. Your caregiver may also give you more specific instructions. Your procedure has been planned according to current medical practices, but problems sometimes occur. Call your caregiver if you have any problems or questions after your procedure. °HOME CARE INSTRUCTIONS  °· Only take over-the-counter or prescription medications as directed. °· Only take pain medications (narcotics) as directed. °· Do not drive until your caregiver approves. Driving while taking narcotics or soon after surgery can be dangerous, so discuss the specific timing with your caregiver. °· Avoid activities that use your chest muscles, such as lifting heavy objects, for at least 3-4 weeks.   °· Take deep breaths to expand the lungs and to protect against pneumonia. °· Do breathing exercises as directed by your caregiver. If you were given an incentive spirometer to help with breathing, use it as directed. °· You may resume a normal diet and activities when you feel you are able to or as directed. °· Do not take a bath until your caregiver says it is OK. Use the shower instead.   °· Keep the bandage (dressing) covering the area where the chest tube was inserted (incision site) dry for 48 hours. After 48 hours, remove the dressing unless there is new drainage. °· Remove dressings as directed by your caregiver. °· Change dressings if necessary or as directed. °· Keep all follow-up appointments. It is important for you to see your caregiver after surgery to discuss appropriate follow-up care and surveillance, if it is necessary. °SEEK MEDICAL CARE: °· You feel excessive or increasing pain at an incision site. °· You notice bleeding, skin irritation, drainage, swelling, or redness at an incision site. °· There is a bad smell coming from an incision or dressing. °· It feels  like your heart is fluttering or beating rapidly. °· Your pain medication does not relieve your pain. °SEEK IMMEDIATE MEDICAL CARE IF:  °· You have a fever.   °· You have chest pain.  °· You have a rash. °· You have shortness of breath. °· You have trouble breathing.   °· You feel weak, lightheaded, dizzy, or faint.   °MAKE SURE YOU:  °· Understand these instructions.   °· Will watch your condition.   °· Will get help right away if you are not doing well or get worse. °Document Released: 09/15/2012 Document Reviewed: 09/15/2012 °[employer]® Patient Information ©2015 [employer]. This information is not intended to replace advice given to you by your health care provider. Make sure you discuss any questions you have with your health care provider. ° °

## 2014-11-29 ENCOUNTER — Inpatient Hospital Stay (HOSPITAL_COMMUNITY): Payer: Medicare HMO

## 2014-11-29 LAB — BASIC METABOLIC PANEL
Anion gap: 7 (ref 5–15)
BUN: 6 mg/dL (ref 6–20)
CO2: 27 mmol/L (ref 22–32)
CREATININE: 0.56 mg/dL (ref 0.44–1.00)
Calcium: 8.7 mg/dL — ABNORMAL LOW (ref 8.9–10.3)
Chloride: 104 mmol/L (ref 101–111)
GLUCOSE: 98 mg/dL (ref 65–99)
POTASSIUM: 3.6 mmol/L (ref 3.5–5.1)
Sodium: 138 mmol/L (ref 135–145)

## 2014-11-29 MED ORDER — ALBUTEROL SULFATE (2.5 MG/3ML) 0.083% IN NEBU
2.5000 mg | INHALATION_SOLUTION | RESPIRATORY_TRACT | Status: DC | PRN
Start: 2014-11-29 — End: 2014-11-29

## 2014-11-29 MED ORDER — LISINOPRIL 10 MG PO TABS: 10.0000 mg | ORAL_TABLET | Freq: Every day | ORAL | Status: AC

## 2014-11-29 MED ORDER — OXYCODONE HCL 5 MG PO TABS: 5.0000 mg | ORAL_TABLET | ORAL | Status: DC | PRN

## 2014-11-29 NOTE — Progress Notes (Signed)
Melissa Snyder to be D/C'd Home per MD order.  Discussed with the patient and all questions fully answered.  VSS, Skin clean, dry and intact without evidence of skin break down, no evidence of skin tears noted. IV catheter discontinued intact. Site without signs and symptoms of complications. Dressing and pressure applied.  An After Visit Summary was printed and given to the patient. Patient received prescription.  D/c education completed with patient/family including follow up instructions, medication list, d/c activities limitations if indicated, with other d/c instructions as indicated by MD - patient able to verbalize understanding, all questions fully answered.   Patient instructed to return to ED, call 911, or call MD for any changes in condition.   Patient escorted via [place], and D/C home via private auto.  [name] [facility] 11/29/2014 11:10 AM

## 2014-11-29 NOTE — Progress Notes (Addendum)
[address]       [place] [address]             [phone]      4 Days Post-Op Procedure(s) (LRB): VIDEO BRONCHOSCOPY WITH ENDOBRONCHIAL ULTRASOUND (N/A) VIDEO ASSISTED THORACOSCOPY (Left) LEFT LOWER LOBE SEGMENTECTOMY (Left) CRYO INTERCOSTAL NERVE BLOCK (Left) Subjective: Minor discomforts but overall doing well  Objective: Vital signs in last 24 hours: Temp:  [97.9 F (36.6 C)-98.9 F (37.2 C)] 97.9 F (36.6 C) (06/27 0400) Pulse Rate:  [65-78] 78 (06/27 0400) Cardiac Rhythm:  [-] Normal sinus rhythm (06/27 0400) Resp:  [14-23] 22 (06/27 0400) BP: (129-186)/(48-74) 142/56 mmHg (06/27 0400) SpO2:  [92 %-97 %] 92 % (06/27 0400)  Hemodynamic parameters for last 24 hours:    Intake/Output from previous day: 06/26 0701 - 06/27 0700 In: 1230 [P.O.:980; I.V.:250] Out: -  Intake/Output this shift:    General appearance: alert, cooperative and no distress Heart: regular rate and rhythm Lungs: clear to auscultation bilaterally Abdomen: benign Extremities: no eema Wound: incis healing well Extremities: no "edema" Lab Results:  Recent Labs  11/27/14 0452 11/28/14 0400  WBC 7.0 7.0  HGB 10.3* 11.1*  HCT 31.5* 33.5*  PLT 142* 176   BMET:  Recent Labs  11/28/14 0400 11/29/14 0225  NA 142 138  K 4.1 3.6  CL 107 104  CO2 30 27  GLUCOSE 113* 98  BUN <5* 6  CREATININE 0.49 0.56  CALCIUM 8.7* 8.7*    PT/INR: No results for input(s): LABPROT, INR in the last 72 hours. ABG    Component Value Date/Time   PHART 7.409 11/26/2014 0356   HCO3 25.3* 11/26/2014 0356   TCO2 26.6 11/26/2014 0356   O2SAT 99.6 11/26/2014 0356   CBG (last 3)  No results for input(s): GLUCAP in the last 72 hours.  Meds Scheduled Meds: . acetaminophen  1,000 mg Oral 4 times per day   Or  . acetaminophen (TYLENOL) oral liquid 160 mg/5 mL  1,000 mg Oral 4 times per day  . albuterol  2.5 mg Nebulization QID  . antiseptic oral rinse  7 mL Mouth Rinse BID  .  aspirin  81 mg Oral Daily  . atenolol  50 mg Oral QHS  . enoxaparin (LOVENOX) injection  40 mg Subcutaneous Q24H  . levothyroxine  112 mcg Oral QAC breakfast  . lipase/protease/amylase  24,000 Units Oral Q breakfast  . lisinopril  5 mg Oral Daily  . pantoprazole  40 mg Oral BID   Continuous Infusions:  PRN Meds:.hydrALAZINE, ondansetron (ZOFRAN) IV, oxyCODONE, potassium chloride  Xrays Dg Chest 2 View  11/28/2014   CLINICAL DATA:  Status post partial lobectomy of left lung.  EXAM: CHEST  2 VIEW  COMPARISON:  11/27/2014  FINDINGS: The right IJ catheter tip is in the cavoatrial junction. Normal heart size. Postop changes involving the left lung again noted. Small left apical pneumothorax is unchanged. Chronic interstitial coarsening noted bilaterally. Stable scar versus platelike atelectasis in the left base.  IMPRESSION: Persistent small left apical pneumothorax.   Electronically Signed   By: Kerby Moors M.D.   On: 11/28/2014 09:00   Dg Chest 1v Repeat Same Day  11/27/2014   CLINICAL DATA:  Status post left side chest tube removal.  EXAM: CHEST - 1 VIEW SAME DAY  COMPARISON:  11/27/2014  FINDINGS: Tiny residual left pneumothorax is identified after left chest tube removal. The right IJ catheter tip is in the SVC. Heart size  is normal. No change in bilateral upper lobe opacities .  IMPRESSION: 1. Tiny residual left apical pneumothorax after chest tube removal.   Electronically Signed   By: Kerby Moors M.D.   On: 11/27/2014 11:42    Assessment/Plan: S/P Procedure(s) (LRB): VIDEO BRONCHOSCOPY WITH ENDOBRONCHIAL ULTRASOUND (N/A) VIDEO ASSISTED THORACOSCOPY (Left) LEFT LOWER LOBE SEGMENTECTOMY (Left) CRYO INTERCOSTAL NERVE BLOCK (Left)  1 conts to do well 2 labs stable, creat nl 3 some HTN; will increase ACEI 4 d/c home   LOS: 4 days    GOLD,WAYNE E 11/29/2014  Patient seen and examined, agree with above  Remo Lipps C. Roxan Hockey, MD [facility] [phone]

## 2014-11-29 NOTE — Care Management (Signed)
Important Message  Patient Details  Name: Melissa Snyder MRN: [id number] Date of Birth: [birth year]   Medicare Important Message Given:  Yes-second notification given    Nathen May 11/29/2014, 4:24 PM

## 2014-12-03 ENCOUNTER — Other Ambulatory Visit: Payer: Self-pay | Admitting: *Deleted

## 2014-12-03 ENCOUNTER — Encounter (INDEPENDENT_AMBULATORY_CARE_PROVIDER_SITE_OTHER): Payer: Self-pay

## 2014-12-03 DIAGNOSIS — C3490 Malignant neoplasm of unspecified part of unspecified bronchus or lung: Secondary | ICD-10-CM

## 2014-12-03 DIAGNOSIS — G8918 Other acute postprocedural pain: Secondary | ICD-10-CM

## 2014-12-03 MED ORDER — OXYCODONE HCL 5 MG PO TABS: 5.0000 mg | ORAL_TABLET | ORAL | Status: DC | PRN

## 2014-12-13 ENCOUNTER — Other Ambulatory Visit: Payer: Self-pay | Admitting: Thoracic Surgery (Cardiothoracic Vascular Surgery)

## 2014-12-13 DIAGNOSIS — C3490 Malignant neoplasm of unspecified part of unspecified bronchus or lung: Secondary | ICD-10-CM

## 2014-12-14 ENCOUNTER — Ambulatory Visit (INDEPENDENT_AMBULATORY_CARE_PROVIDER_SITE_OTHER): Payer: Self-pay | Admitting: Thoracic Surgery (Cardiothoracic Vascular Surgery)

## 2014-12-14 ENCOUNTER — Ambulatory Visit
Admission: RE | Admit: 2014-12-14 | Discharge: 2014-12-14 | Disposition: A | Discharge status: Home / Self Care | Payer: Medicare HMO | Source: Ambulatory Visit | Attending: Thoracic Surgery (Cardiothoracic Vascular Surgery) | Admitting: Thoracic Surgery (Cardiothoracic Vascular Surgery)

## 2014-12-14 ENCOUNTER — Encounter: Payer: Self-pay | Admitting: Thoracic Surgery (Cardiothoracic Vascular Surgery)

## 2014-12-14 VITALS — BP 154/73 | HR 63 | Resp 16 | Ht 66.0 in | Wt 134.0 lb

## 2014-12-14 DIAGNOSIS — C3490 Malignant neoplasm of unspecified part of unspecified bronchus or lung: Secondary | ICD-10-CM

## 2014-12-14 DIAGNOSIS — C50911 Malignant neoplasm of unspecified site of right female breast: Secondary | ICD-10-CM

## 2014-12-14 DIAGNOSIS — C3432 Malignant neoplasm of lower lobe, left bronchus or lung: Secondary | ICD-10-CM

## 2014-12-14 NOTE — Progress Notes (Signed)
[address]       [place] [address]             [phone]       HPI:  Melissa Snyder returns for a scheduled postoperative follow-up visit.  She is a 81 year old Snyder who had a left lower lobe superior segmentectomy on 11/25/2014. Her lung nodule turned out to be a stage IA non-small cell carcinoma. Her postoperative course was unremarkable and she was discharged on postoperative day #4.  She has not noted any problems with her breathing. She does complain of incisional pain. She says she has a tightness under her left breast. This is not exertional in nature. It is aggravated by positioning. She is using oxycodone about every 4 hours during the day.  Past Medical History  Diagnosis Date  . Chest pain   . Bundle branch block left   . Hypothyroid   . Hypertension   . Hyperlipidemia   . GERD (gastroesophageal reflux disease)   . Melanoma in situ of face JUNE 2015    LEFT CHEEK  . Breast cancer 05/2014    ER+/PR+/Her2-  . Family history of breast cancer   . Family history of bladder cancer   . Family history of prostate cancer   . Blind left eye 10/2013    cva  . Skin cancer   . Migraine     reports only having one  . Osteoarthritis   . Stroke 2015    Blind in left eye as a result  . Lung nodule   . History of blood transfusion     age 14  . Meningioma     brain lining    Current Outpatient Prescriptions  Medication Sig Dispense Refill  . Alpha-D-Galactosidase (BEANO PO) Take 2 capsules by mouth 2 (two) times daily as needed (ONLY TAKES WHEN TRAVELING  - takes in place of digestive enzymes).    Marland Kitchen aspirin 81 MG chewable tablet Chew 1 tablet (81 mg total) by mouth daily. 30 tablet 0  . atenolol (TENORMIN) 50 MG tablet Take 50 mg by mouth at bedtime.     Marland Kitchen b complex vitamins capsule Take 1 capsule by mouth daily.    . calcium-vitamin D (OSCAL WITH D) 500-200 MG-UNIT per tablet Take 1 tablet by mouth every morning.    . Digestive Enzymes  (DIGESTIVE ENZYME PO) Take 1 capsule by mouth daily.     . fish oil-omega-3 fatty acids 1000 MG capsule Take 1 g by mouth every morning.     Marland Kitchen levothyroxine (SYNTHROID, LEVOTHROID) 112 MCG tablet Take 112 mcg by mouth every morning.     Marland Kitchen lisinopril (PRINIVIL,ZESTRIL) 10 MG tablet Take 1 tablet (10 mg total) by mouth daily. 30 tablet 1  . Multiple Vitamin (MULTIVITAMIN) tablet Take 1 tablet by mouth every morning.     . Multiple Vitamins-Minerals (VISION FORMULA PO) Take 1 capsule by mouth daily.    Marland Kitchen oxyCODONE (OXY IR/ROXICODONE) 5 MG immediate release tablet Take 1-2 tablets (5-10 mg total) by mouth every 4 (four) hours as needed for moderate pain. 50 tablet 0  . vitamin E (E-400) 400 UNIT capsule Take 400 Units by mouth 3 (three) times a week. Mon Wed Fri     No current facility-administered medications for this visit.    Physical Exam BP 154/73 mmHg  Pulse 63  Resp 16  Ht '5\' 6"'  (1.676 m)  Wt 134 lb (60.782 kg)  BMI 21.Melissa kg/m2  SpO37 75% 81 year old Snyder in no acute distress Alert and oriented 3 with no focal deficits Lungs clear with equal breath sounds bilaterally Cardiac regular rate and rhythm normal S1 and S2 Incisions healing well  Diagnostic Tests: Chest x-ray shows postoperative changes with no effusions or infiltrates  Impression: Melissa Snyder is a 81 year old Snyder who is now about 2 1/2 weeks out from a thoracoscopic left lower lobe superior segmentectomy for stage Ia non-small cell carcinoma. She was doing well at this time. She is still having some discomfort, which is to be expected. That should improve over time.  I recommended that she not drive until she is no longer taking narcotics during the day. Her activities are otherwise unrestricted, but she was advised to build into new activities gradually.  She has seen Dr. Burr Medico for her breast cancer. I am going to schedule a follow-up with Dr. Burr Medico regarding the lung cancer as well. Given that its a stage IA lesion,  she should not need any adjuvant therapy. I think the follow-ups should be primarily be through oncology and radiation oncology to avoid duplication of testing given her breast cancer and lung cancer.  Plan: I will see her back in 2 months to check on her. We will get a chest x-ray at that time.  [name], MD [facility] [phone]

## 2014-12-16 ENCOUNTER — Telehealth: Payer: Self-pay | Admitting: Hematology

## 2014-12-16 NOTE — Telephone Encounter (Signed)
Jana Half from [facility] called to make appt for f/u w/Feng-gave appt time & date-stated she will call pt

## 2014-12-23 ENCOUNTER — Other Ambulatory Visit (HOSPITAL_BASED_OUTPATIENT_CLINIC_OR_DEPARTMENT_OTHER): Payer: Medicare HMO

## 2014-12-23 ENCOUNTER — Encounter: Payer: Self-pay | Admitting: Hematology

## 2014-12-23 ENCOUNTER — Ambulatory Visit (HOSPITAL_BASED_OUTPATIENT_CLINIC_OR_DEPARTMENT_OTHER): Payer: Medicare HMO | Admitting: Hematology

## 2014-12-23 ENCOUNTER — Telehealth: Payer: Self-pay | Admitting: Hematology

## 2014-12-23 VITALS — BP 147/61 | HR 68 | Temp 98.0°F | Resp 18 | Ht 66.0 in | Wt 127.3 lb

## 2014-12-23 DIAGNOSIS — C50911 Malignant neoplasm of unspecified site of right female breast: Secondary | ICD-10-CM

## 2014-12-23 DIAGNOSIS — C50411 Malignant neoplasm of upper-outer quadrant of right female breast: Secondary | ICD-10-CM

## 2014-12-23 DIAGNOSIS — C3432 Malignant neoplasm of lower lobe, left bronchus or lung: Secondary | ICD-10-CM

## 2014-12-23 DIAGNOSIS — Z1379 Encounter for other screening for genetic and chromosomal anomalies: Secondary | ICD-10-CM

## 2014-12-23 LAB — CBC WITH DIFFERENTIAL/PLATELET
BASO%: 1 % (ref 0.0–2.0)
Basophils Absolute: 0.1 10e3/uL (ref 0.0–0.1)
EOS%: 6.1 % (ref 0.0–7.0)
Eosinophils Absolute: 0.3 10e3/uL (ref 0.0–0.5)
HCT: 36.9 % (ref 34.8–46.6)
HGB: 12.2 g/dL (ref 11.6–15.9)
LYMPH%: 14.5 % (ref 14.0–49.7)
MCH: 28.7 pg (ref 25.1–34.0)
MCHC: 33.2 g/dL (ref 31.5–36.0)
MCV: 86.5 fL (ref 79.5–101.0)
MONO#: 0.4 10e3/uL (ref 0.1–0.9)
MONO%: 7.6 % (ref 0.0–14.0)
NEUT#: 3.5 10e3/uL (ref 1.5–6.5)
NEUT%: 70.8 % (ref 38.4–76.8)
Platelets: 150 10e3/uL (ref 145–400)
RBC: 4.26 10e6/uL (ref 3.70–5.45)
RDW: 14.3 % (ref 11.2–14.5)
WBC: 5 10e3/uL (ref 3.9–10.3)
lymph#: 0.7 10e3/uL — ABNORMAL LOW (ref 0.9–3.3)

## 2014-12-23 LAB — COMPREHENSIVE METABOLIC PANEL (CC13)
ALBUMIN: 3.9 g/dL (ref 3.5–5.0)
ALK PHOS: 91 U/L (ref 40–150)
ALT: 14 U/L (ref 0–55)
AST: 15 U/L (ref 5–34)
Anion Gap: 6 mEq/L (ref 3–11)
BUN: 9.5 mg/dL (ref 7.0–26.0)
CO2: 29 meq/L (ref 22–29)
Calcium: 9.4 mg/dL (ref 8.4–10.4)
Chloride: 105 mEq/L (ref 98–109)
Creatinine: 0.7 mg/dL (ref 0.6–1.1)
EGFR: 82 mL/min/{1.73_m2} — AB (ref >90)
Glucose: 105 mg/dl (ref 70–140)
POTASSIUM: 4 meq/L (ref 3.5–5.1)
SODIUM: 140 meq/L (ref 136–145)
Total Bilirubin: 0.53 mg/dL (ref 0.20–1.20)
Total Protein: 6.8 g/dL (ref 6.4–8.3)

## 2014-12-23 NOTE — Telephone Encounter (Signed)
per pof to sch pt appt-gave pt copy of av

## 2014-12-23 NOTE — Progress Notes (Signed)
[facility]  [phone] Fax:([phone]) [phone]  Clinic New Consult Note   Patient Care Team: [name], MD as PCP - General (Internal Medicine) [name], MD as Consulting Physician (Cardiothoracic Surgery) [name], MD as Consulting Physician (Urology) [name], MD as Consulting Physician (Hematology) [name], MD as Consulting Physician (Radiation Oncology) 12/23/2014  CHIEF COMPLAINTS:  Follow up right breast caner   Oncology History   Breast cancer   Staging form: Breast, AJCC 7th Edition     Pathologic stage from 10/17/2014: Stage IA (T1b, N0, cM0) - Signed by [name], MD on 10/17/2014 Lung cancer   Staging form: Lung, AJCC 7th Edition     Clinical stage from 12/14/2014: Stage IA (T1a, N0, M0) - Unsigned       Breast cancer   05/04/2014 Initial Diagnosis Breast cancer   05/19/2014 Imaging Diagnostic mammogram and ultrasound showed a 7 mm irregular suspicious mass located in the right breast at the 10:00 position, 7 cm from the nipple. Otherwise negative.   05/19/2014 Initial Biopsy Right breast mass biopsy showed invasive ductal carcinoma.   05/19/2014 Receptors her2 ER 100% positive, PR 75% positive, HER-2 negative, Ki67 8%   07/23/2014 Surgery Right breast lumpectomy, showed invasive ductal carcinoma, negative surgical margins.   07/23/2014 Pathology Results Invasive ductal carcinoma, tumor size 0.8 cm, grade 1, no lymphovascular invasion, margins were negative, 2 sentinel lymph nodes were negative. (+) DCIS.    Lung cancer   12/01/2014 Initial Diagnosis Lung cancer   12/01/2014 Surgery left low lobe superior segmental resection for stage I lung adenocarcinoma, surgical margins were negative.    12/01/2014 Pathology Results Well differentiated adenocarcinoma, spanning 1.5 cm, lymph node level IX, 10, 11, and a 7 were all negative. Surgical margins were negative.    HISTORY OF PRESENTING ILLNESS:  Melissa Snyder 81  y.o. female is here because of newly diagnosed breast cancer. She was referred by Dr. Lucia Gaskins. She came in today by herself.   This was found on screening mammogram on 05/03/2014, and the diagnosed mammogram and US showed a 66m lesion at 10:00 of right breast. She underwent biopsy and it showed IDA, ER+/PR+/HER2-.   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 She was also found multiple small LLL lung nodules during that time, which is followed by pulmonologist Dr. HRoxan Hockey  He is to see you in the She feels well in general, left eye blindness has not changed. No pain, cough, abdominal discomfort or other complains. No weight loss.   INTERIM HISTORY: Melissa Snyder for follow-up, she presents to this clinic by herself. She recently underwent thoracotomy and left lower lobe lung segmentectomy for early-stage lung cancer back Dr. HRoxan Hockey She tolerated the procedure well, and has been recovering slowly. Her pain is much better, she is not taking any pain medication. She still feel quite fatigued with low appetite. She has lost about 20 pounds since the surgery. She is able to do all self-care and some light house chore, but does not call out due to the hot weather. She denies depression.  MEDICAL HISTORY:  Past Medical History  Diagnosis Date  . Chest pain   . Bundle branch block left   . Hypothyroid   . Hypertension   . Hyperlipidemia   . GERD (gastroesophageal reflux disease)   . Melanoma in situ of face JUNE 2015    LEFT CHEEK  . Breast cancer 05/2014    ER+/PR+/Her2-  .  Family history of breast cancer   . Family history of bladder cancer   . Family history of prostate cancer   . Blind left eye 10/2013    cva  . Skin cancer   . Migraine     reports only having one  . Osteoarthritis   . Stroke 2015    Blind in left eye as a result  . Lung nodule   . History of blood transfusion     age 60  . Meningioma     brain lining  stroke  in May 2015, with residual left vision loss.   SURGICAL HISTORY: Past Surgical History  Procedure Laterality Date  . Dilation and curettage of uterus      X 2  . Bilateral salpingoophorectomy    . Bladder repair  2009    cystocele  . Melanoma excision Left 11/05/23, 11/30/13    CHEEK  . Bunionectomy      bilateral great toe  . Ct radiation therapy guide      Gamma radiation -lt frontal-[facility]  . Cardiac catheterization  2004  . Colonoscopy    . Appendectomy      age 10  . Abdominal hysterectomy  ~ 1997  . Cholecystectomy  ~ 1997  . Other surgical history      Gamma knife to Meningioma in brain lining  . Video bronchoscopy with endobronchial ultrasound N/A 11/25/2014    Procedure: VIDEO BRONCHOSCOPY WITH ENDOBRONCHIAL ULTRASOUND;  Surgeon: [name], MD;  Location: [facility];  Service: Thoracic;  Laterality: N/A;  . Video assisted thoracoscopy Left 11/25/2014    Procedure: VIDEO ASSISTED THORACOSCOPY;  Surgeon: [name], MD;  Location: [place];  Service: Thoracic;  Laterality: Left;  . Segmentecomy Left 11/25/2014    Procedure: LEFT LOWER LOBE SEGMENTECTOMY;  Surgeon: [name], MD;  Location: [place];  Service: Thoracic;  Laterality: Left;  . Cryo intercostal nerve block Left 11/25/2014    Procedure: CRYO INTERCOSTAL NERVE BLOCK;  Surgeon: [name], MD;  Location: [facility];  Service: Thoracic;  Laterality: Left;    SOCIAL HISTORY: History   Social History  . Marital Status: Married    Spouse Name: N/A  . Number of Children: 4  . Years of Education: N/A   Occupational History  . Not on file.   Social History Main Topics  . Smoking status: Never Smoker   . Smokeless tobacco: Never Used  . Alcohol Use: No  . Drug Use: No  . Sexual Activity: Not on file   Other Topics Concern  . Not on file   Social History Narrative    FAMILY HISTORY: Family History  Problem Relation Age of Onset  . CAD Mother     CABG  . Hyperlipidemia Mother    . AAA (abdominal aortic aneurysm) Mother   . Renal Disease Father   . Kidney disease Father   . CAD Father   . Cancer Father     PROSTATE  . Diabetes Brother   . Hyperlipidemia Brother   . Breast cancer Daughter 49  . Breast cancer Sister 86  . Breast cancer Sister 26  . Cancer Brother     NOS  . Bladder Cancer Brother 65  . Breast cancer Cousin     five paternal first cousins  . Lung cancer Cousin     3 paternal cousins with lung cancer  . Cancer Cousin     4 paternal first cousins with Cancer NOS  .  Cancer Cousin     1 paternal cousin with oral cancer (tongue)   GYN HISTORY  Menarchal: 70 LMP: 37 (hsrectomy)  Contraceptive: a few years  HRT: 4 years.  G4P4:   ALLERGIES:  is allergic to beef-derived products; chocolate; hydrocodone; other; tramadol; ibuprofen; and niacin and related.  MEDICATIONS:  Current Outpatient Prescriptions  Medication Sig Dispense Refill  . Alpha-D-Galactosidase (BEANO PO) Take 2 capsules by mouth 2 (two) times daily as needed (ONLY TAKES WHEN TRAVELING  - takes in place of digestive enzymes).    Marland Kitchen aspirin 81 MG chewable tablet Chew 1 tablet (81 mg total) by mouth daily. 30 tablet 0  . atenolol (TENORMIN) 50 MG tablet Take 50 mg by mouth at bedtime.     Marland Kitchen b complex vitamins capsule Take 1 capsule by mouth daily.    . calcium-vitamin D (OSCAL WITH D) 500-200 MG-UNIT per tablet Take 1 tablet by mouth every morning.    . Digestive Enzymes (DIGESTIVE ENZYME PO) Take 1 capsule by mouth daily.     . fish oil-omega-3 fatty acids 1000 MG capsule Take 1 g by mouth every morning.     Marland Kitchen levothyroxine (SYNTHROID, LEVOTHROID) 112 MCG tablet Take 112 mcg by mouth every morning.     Marland Kitchen lisinopril (PRINIVIL,ZESTRIL) 10 MG tablet Take 1 tablet (10 mg total) by mouth daily. 30 tablet 1  . Multiple Vitamin (MULTIVITAMIN) tablet Take 1 tablet by mouth every morning.     . Multiple Vitamins-Minerals (VISION FORMULA PO) Take 1 capsule by mouth daily.    Marland Kitchen oxyCODONE  (OXY IR/ROXICODONE) 5 MG immediate release tablet Take 1-2 tablets (5-10 mg total) by mouth every 4 (four) hours as needed for moderate pain. 50 tablet 0  . vitamin E (E-400) 400 UNIT capsule Take 400 Units by mouth 3 (three) times a week. Mon Wed Fri     No current facility-administered medications for this visit.    REVIEW OF SYSTEMS:   Constitutional: Denies fevers, chills or abnormal night sweats Eyes: Denies blurriness of vision, double vision or watery eyes Ears, nose, mouth, throat, and face: Denies mucositis or sore throat Respiratory: Denies cough, dyspnea or wheezes Cardiovascular: Denies palpitation, chest discomfort or lower extremity swelling Gastrointestinal:  Denies nausea, heartburn or change in bowel habits Skin: Denies abnormal skin rashes Lymphatics: Denies new lymphadenopathy or easy bruising Neurological:Denies numbness, tingling or new weaknesses Behavioral/Psych: Mood is stable, no new changes  All other systems were reviewed with the patient and are negative.  PHYSICAL EXAMINATION: ECOG PERFORMANCE STATUS: 2  Filed Vitals:   12/23/14 0859  BP: 147/61  Pulse: 68  Temp: 98 F (36.7 C)  Resp: 18   Filed Weights   12/23/14 0859  Weight: 127 lb 4.8 oz (57.743 kg)    GENERAL:alert, no distress and comfortable SKIN: skin color, texture, turgor are normal, no rashes or significant lesions EYES: normal, conjunctiva are pink and non-injected, sclera clear OROPHARYNX:no exudate, no erythema and lips, buccal mucosa, and tongue normal  NECK: supple, thyroid normal size, non-tender, without nodularity LYMPH:  no palpable lymphadenopathy in the cervical, axillary or inguinal LUNGS: clear to auscultation and percussion with normal breathing effort, (+) left thoracotomy scar is well-healed  HEART: regular rate & rhythm and no murmurs and no lower extremity edema ABDOMEN:abdomen soft, non-tender and normal bowel sounds Musculoskeletal:no cyanosis of digits and no  clubbing  PSYCH: alert & oriented x 3 with fluent speech NEURO: no focal motor/sensory deficits Breasts: Breast inspection showed them to be  symmetrical with no nipple discharge. The surgical incision at the outer quadrant of right breast is healing well. Palpation of the breasts and axilla revealed no obvious mass that I could appreciate.   LABORATORY DATA:  I have reviewed the data as listed Lab Results  Component Value Date   WBC 5.0 12/23/2014   HGB 12.2 12/23/2014   HCT 36.9 12/23/2014   MCV 86.5 12/23/2014   PLT 150 12/23/2014    Recent Labs  11/23/14 1302  11/27/14 0452 11/28/14 0400 11/29/14 0225 12/23/14 0833  NA 138  < > 142 142 138 140  K 4.1  < > 3.7 4.1 3.6 4.0  CL 104  < > 107 107 104  --   CO2 24  < > '29 30 27 29  ' GLUCOSE 135*  < > 108* 113* 98 105  BUN 17  < > 6 <5* 6 9.5  CREATININE 0.53  < > 0.54 0.49 0.56 0.7  CALCIUM 8.9  < > 8.5* 8.7* 8.7* 9.4  GFRNONAA >60  < > >60 >60 >60  --   GFRAA >60  < > >60 >60 >60  --   PROT 6.9  --  5.4*  --   --  6.8  ALBUMIN 3.9  --  2.7*  --   --  3.9  AST 21  --  34  --   --  15  ALT 21  --  42  --   --  14  ALKPHOS 84  --  79  --   --  91  BILITOT 0.8  --  0.8  --   --  0.53  < > = values in this interval not displayed.  GENETICS: Negative BRCAPlus testing from [facility].  The BRCAPlus gene panel offered by [facility] and includes sequencing and rearrangement analysis for the following 6 genes: BRCA1, BRCA2, CDH1, PALB2, PTEN, and TP53.  Report date is 06/21/2014.    Negative CancerNext panel testing testing.  The CancerNext gene panel offered by [employer] and includes sequencing and rearrangement analysis for the following 32 genes:   APC, ATM, BARD1, BMPR1A, BRCA1, BRCA2, BRIP1, CDH1, CDK4, CDKN2A, CHEK2, EPCAM, GREM1, MLH1, MRE11A, MSH2, MSH6, MUTYH, NBN, NF1, PALB2, PMS2, POLD1, POLE, PTEN, RAD50, RAD51D, SMAD4, SMARCA4, STK11, and TP53.  The report date is: July 15, 2014.  PAHTOLOGY REPORT  1.  Breast, lumpectomy, right 07/23/2014 - INVASIVE DUCTAL CARCINOMA, SEE COMMENT. - NEGATIVE FOR LYMPH VASCULAR INVASION . - INVASIVE TUMOR IS 0.4 CM FROM THE NEAREST MARGIN (SUPERIOR AND POSTERIOR). - DUCTAL CARCINOMA IN SITU. - CALCIFICATIONS IDENTIFIED. - PREVIOUS BIOPSY SITE IDENTIFIED. - SEE TUMOR SYNOPTIC TEMPLATE BELOW. 1 of 4 FINAL for CYNDY, BRAVER [phone]) Diagnosis(continued) 2. Lymph node, sentinel, biopsy, right axillary - ONE LYMPH NODE, NEGATIVE FOR TUMOR (0/1). 3. Lymph node, sentinel, biopsy - ONE LYMPH NODE, NEGATIVE FOR TUMOR (0/1). Microscopic Comment 1. BREAST, INVASIVE TUMOR, WITH LYMPH NODES PRESENT Specimen, including laterality and lymph node sampling (sentinel, non-sentinel): Right breast with sentinel lymph node sampling. Procedure: Lumpectomy. Histologic type: Ductal. Grade: I of III.  1 DCIS. Tubule formation: 2 Nuclear pleomorphism: 1 Mitotic: 1 Tumor size (gross measurement): 0.8 cm. Margins: Invasive, distance to closest margin: 0.4 cm. In-situ, distance to closest margin: 0.4 cm (superior and posterior). If margin positive, focally or broadly: N/A. Lymphovascular invasion: Absent. Ductal carcinoma in situ: Present. Grade: I of III. Extensive intraductal component: Absent. Lobular neoplasia: Absent. Tumor focality: Unifocal. Treatment effect: None. If present, treatment  effect in breast tissue, lymph nodes or both: N/A. Extent of tumor: Skin: N/A. Nipple: N/A. Skeletal muscle: N/A. Lymph nodes: Examined: 2 Sentinel 0 Non-sentinel 2 Total Lymph nodes with metastasis: 0. Isolated tumor cells (< 0.2 mm): N/A. Micrometastasis: (> 0.2 mm and < 2.0 mm): N/A. Macrometastasis: (> 2.0 mm): N/A. Extracapsular extension: N/A. Breast prognostic profile: Estrogen receptor: Not repeated, previous study demonstrated 100% positivity ([id number]). Progesterone receptor: Not repeated, previous study demonstrated 35% positivity ([id number]). Her  2 neu: Repeated, previous study demonstrated no amplification ([id number]). Ki-67: Not repeated, previous study demonstrated 8% proliferation rate ([id number]). Non-neoplastic breast: Previous biopsy site tissue changes, fibroadenomatoid nodules, fibrocytic change, vascular calcifications, and   Diagnosis 12/01/2014 1. Lung, resection (segmental or lobe), Superior segment left lower lobe - ADENOCARCINOMA, WELL DIFFERENTIATED, SPANNING 1.5 CM. - THE SURGICAL RESECTION MARGINS ARE NEGATIVE FOR CARCINOMA. - SEE ONCOLOGY TABLE BELOW. 2. Lymph node, biopsy, Level 9 - THERE IS NO EVIDENCE OF CARCINOMA IN 1 OF 1 LYMPH NODE (0/1). 3. Lymph node, biopsy, Level 10 - THERE IS NO EVIDENCE OF CARCINOMA IN 1 OF 1 LYMPH NODE (0/1). 4. Lymph node, biopsy, Level 11 - THERE IS NO EVIDENCE OF CARCINOMA IN 1 OF 1 LYMPH NODE (0/1). 5. Lymph node, biopsy, Level 11 #2 - THERE IS NO EVIDENCE OF CARCINOMA IN 1 OF 1 LYMPH NODE (0/1). 6. Lymph node, biopsy, Level 7 - THERE IS NO EVIDENCE OF CARCINOMA IN 1 OF 1 LYMPH NODE (0/1).  RADIOGRAPHIC STUDIES: I have personally reviewed the radiological images as listed and agreed with the findings in the report.  Ct Chest Wo Contrast 05/25/2014    IMPRESSION:  1. Persistence and similar size of the dominant left lower lobe sub solid nodule. Adenocarcinoma is considered. Biopsy or resection should be strongly considered. These recommendations are taken from:Recommendations for the Management of Subsolid Pulmonary Nodules Detected at CT: A Statement from the [place], Radiology 2013; 266:1, 304-317.  2. Additional scattered small ground-glass nodules are stable.    Electronically Signed   By: Sherryl Barters M.D.   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  1. Right breast invasive ductal carcinoma, pT1b N0 M0 stage IA, ER positive PR positive and HER-2 negative. -She had lumpectomy with sentinel lymph node biopsy, negative surgical margins.  -Due to her family history of breast cancer in multiple family members, and multiple personal history of cancer, she has been evaluated by our gDietitian Her genetic testing for inherited breast cancer was negative -Given the positive hormone receptors, I recommend adjuvant endocrine therapy with one of the aromatase inhibitor, to reduce her risks of cancer recurrence in the future. The benefit and the potential side effects were discussed with patient, and sheis very reluctant to take it due to her advanced age. Given her multiple comorbidities, especially recent lung cancer surgery, I think is reasonable if she does not want to take it. Again the benefit and side effects were discussed with her, and the written materials were provided  to her today. -Given the early stage of breast cancer, low grade, her advanced age and multiple comorbidities, I do not recommend Oncotype test or adjuvant chemotherapy.    2. Stage I left lung adenocarcinoma, pT1aN0M0 -She recently underwent left lower lobe segmentectomy, which showed stage I adenocarcinoma -Given the early stage of lung cancer, she does not need adjuvant chemotherapy. -She has multiple other small lung nodules, close surveillance with discussed with her. I recommend repeat a CT chest every 6-12 months for 5 years  -She will follow-up with Dr. Roxan Hockey with surveillance scan.  3. She'll continue follow-up with her primary care physician and  other specialists for her other medical issues.  Plan: -I'll see her back in 1 month, to see if she can recover so better from her recent surgery, and finalize about her adjuvant aromatase inhibitor  -I also told her if she decides not to take Arimidex, she will call me, and I'll see her back in 6 months   All questions were answered. The patient knows to call the clinic with any problems, questions or concerns. I spent 20 minutes counseling the patient face to face. The total time spent in the appointment was 30 minutes and more than 50% was on counseling.     [name], MD 12/23/2014 10:03 AM

## 2014-12-28 ENCOUNTER — Other Ambulatory Visit: Payer: Self-pay

## 2014-12-28 DIAGNOSIS — G8918 Other acute postprocedural pain: Secondary | ICD-10-CM

## 2014-12-28 MED ORDER — OXYCODONE HCL 5 MG PO TABS: 5.0000 mg | ORAL_TABLET | Freq: Four times a day (QID) | ORAL | Status: DC | PRN

## 2014-12-28 NOTE — Telephone Encounter (Signed)
RX refill on Oxycodone 5 mg #40 given. Patient will pick up at front desk

## 2015-01-17 ENCOUNTER — Encounter: Payer: Self-pay | Admitting: Nutrition

## 2015-01-17 NOTE — Progress Notes (Unsigned)
Patient called to cancel nutrition appointment.  She states she is eating much better and doesn't need appointment.

## 2015-01-24 ENCOUNTER — Telehealth: Payer: Self-pay | Admitting: *Deleted

## 2015-01-24 ENCOUNTER — Encounter: Payer: Medicare HMO | Admitting: Nutrition

## 2015-01-24 ENCOUNTER — Encounter: Payer: Self-pay | Admitting: Hematology

## 2015-01-24 ENCOUNTER — Telehealth: Payer: Self-pay | Admitting: Hematology

## 2015-01-24 ENCOUNTER — Ambulatory Visit (HOSPITAL_BASED_OUTPATIENT_CLINIC_OR_DEPARTMENT_OTHER): Payer: Medicare HMO | Admitting: Hematology

## 2015-01-24 VITALS — BP 158/67 | HR 78 | Temp 97.9°F | Resp 18 | Ht 66.0 in | Wt 130.2 lb

## 2015-01-24 DIAGNOSIS — C50411 Malignant neoplasm of upper-outer quadrant of right female breast: Secondary | ICD-10-CM

## 2015-01-24 DIAGNOSIS — Z170 Estrogen receptor positive status [ER+]: Secondary | ICD-10-CM | POA: Diagnosis not present

## 2015-01-24 DIAGNOSIS — C3432 Malignant neoplasm of lower lobe, left bronchus or lung: Secondary | ICD-10-CM

## 2015-01-24 MED ORDER — EXEMESTANE 25 MG PO TABS: 25.0000 mg | ORAL_TABLET | Freq: Every day | ORAL | Status: DC

## 2015-01-24 NOTE — Telephone Encounter (Signed)
Gave adn printed appt sched and avs for pt for Sept °

## 2015-01-24 NOTE — Telephone Encounter (Signed)
MESSAGE FORWARD TO DR.FENG'S NURSE, THU BRAY,RN.

## 2015-01-24 NOTE — Telephone Encounter (Signed)
Thu,  Could you check with her pharmacy about her copay for anastrozole or letrozole?   Thanks.  [name]

## 2015-01-24 NOTE — Progress Notes (Signed)
[place]  Telephone:([phone] Fax:([phone]) [phone]  Clinic New Consult Note   Patient Care Team: Melissa Infante, MD as PCP - General (Internal Medicine) Melissa Nakayama, MD as Consulting Physician (Cardiothoracic Surgery) Melissa Loser, MD as Consulting Physician (Urology) Melissa Merle, MD as Consulting Physician (Hematology) Melissa Silversmith, MD as Consulting Physician (Radiation Oncology) 01/24/2015  CHIEF COMPLAINTS:  Follow up right breast caner   Oncology History   Breast cancer   Staging form: Breast, AJCC 7th Edition     Pathologic stage from 10/17/2014: Stage IA (T1b, N0, cM0) - Signed by Melissa Silversmith, MD on 10/17/2014 Lung cancer   Staging form: Lung, AJCC 7th Edition     Clinical stage from 12/14/2014: Stage IA (T1a, N0, M0) - Unsigned       Breast cancer of upper-outer quadrant of right female breast   05/04/2014 Initial Diagnosis Breast cancer   05/19/2014 Imaging Diagnostic mammogram and ultrasound showed a 7 mm irregular suspicious mass located in the right breast at the 10:00 position, 7 cm from the nipple. Otherwise negative.   05/19/2014 Initial Biopsy Right breast mass biopsy showed invasive ductal carcinoma.   05/19/2014 Receptors her2 ER 100% positive, PR 75% positive, HER-2 negative, Ki67 8%   07/23/2014 Surgery Right breast lumpectomy, showed invasive ductal carcinoma, negative surgical margins.   07/23/2014 Pathology Results Invasive ductal carcinoma, tumor size 0.8 cm, grade 1, no lymphovascular invasion, margins were negative, 2 sentinel lymph nodes were negative. (+) DCIS.    Lung cancer   12/01/2014 Initial Diagnosis Lung cancer   12/01/2014 Surgery left low lobe superior segmental resection for stage I lung adenocarcinoma, surgical margins were negative.    12/01/2014 Pathology Results Well differentiated adenocarcinoma, spanning 1.5 cm, lymph node level IX, 10, 11, and a 7 were all negative. Surgical margins were negative.     HISTORY OF PRESENTING ILLNESS:  Melissa Snyder 81 y.o. female is here because of newly diagnosed breast cancer. She was referred by Dr. Lucia Snyder. She came in today by herself.   This was found on screening mammogram on 05/03/2014, and the diagnosed mammogram and US showed a 79m lesion at 10:00 of right breast. She underwent biopsy and it showed IDA, ER+/PR+/HER2-.   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 She was also found multiple small LLL lung nodules during that time, which is followed by pulmonologist Dr. HRoxan Snyder  He is to see you in the She feels well in general, left eye blindness has not changed. No pain, cough, abdominal discomfort or other complains. No weight loss.   INTERIM HISTORY: [name] for follow-up, she presents to this clinic by herself. She has recovered better from her lung cancer surgery 2 months ago, occasionally she still has mild chest pain from the surgical site, no dyspnea or other complaints. Her energy level and appetite has also improved, she can a few pounds back. She has mild-to-moderate arthritis, especially in the finger and toe joints.  MEDICAL HISTORY:  Past Medical History  Diagnosis Date  . Chest pain   . Bundle branch block left   . Hypothyroid   . Hypertension   . Hyperlipidemia   . GERD (gastroesophageal reflux disease)   . Melanoma in situ of face JUNE 2015    LEFT CHEEK  . Breast cancer 05/2014    ER+/PR+/Her2-  . Family history of breast cancer   . Family history of bladder cancer   . Family history of  prostate cancer   . Blind left eye 10/2013    cva  . Skin cancer   . Migraine     reports only having one  . Osteoarthritis   . Stroke 2015    Blind in left eye as a result  . Lung nodule   . History of blood transfusion     age 28  . Meningioma     brain lining  stroke in May 2015, with residual left vision loss.   SURGICAL HISTORY: Past Surgical History  Procedure  Laterality Date  . Dilation and curettage of uterus      X 2  . Bilateral salpingoophorectomy    . Bladder repair  2009    cystocele  . Melanoma excision Left 11/05/23, 11/30/13    CHEEK  . Bunionectomy      bilateral great toe  . Ct radiation therapy guide      Gamma radiation -lt frontal-[facility]  . Cardiac catheterization  2004  . Colonoscopy    . Appendectomy      age 89  . Abdominal hysterectomy  ~ 1997  . Cholecystectomy  ~ 1997  . Other surgical history      Gamma knife to Meningioma in brain lining  . Video bronchoscopy with endobronchial ultrasound N/A 11/25/2014    Procedure: VIDEO BRONCHOSCOPY WITH ENDOBRONCHIAL ULTRASOUND;  Surgeon: Melissa Nakayama, MD;  Location: [place];  Service: Thoracic;  Laterality: N/A;  . Video assisted thoracoscopy Left 11/25/2014    Procedure: VIDEO ASSISTED THORACOSCOPY;  Surgeon: Melissa Nakayama, MD;  Location: [place];  Service: Thoracic;  Laterality: Left;  . Segmentecomy Left 11/25/2014    Procedure: LEFT LOWER LOBE SEGMENTECTOMY;  Surgeon: Melissa Nakayama, MD;  Location: [place];  Service: Thoracic;  Laterality: Left;  . Cryo intercostal nerve block Left 11/25/2014    Procedure: CRYO INTERCOSTAL NERVE BLOCK;  Surgeon: Melissa Nakayama, MD;  Location: [place];  Service: Thoracic;  Laterality: Left;    SOCIAL HISTORY: Social History   Social History  . Marital Status: Married    Spouse Name: N/A  . Number of Children: 4  . Years of Education: N/A   Occupational History  . Not on file.   Social History Main Topics  . Smoking status: Never Smoker   . Smokeless tobacco: Never Used  . Alcohol Use: No  . Drug Use: No  . Sexual Activity: Not on file   Other Topics Concern  . Not on file   Social History Narrative    FAMILY HISTORY: Family History  Problem Relation Age of Onset  . CAD Mother     CABG  . Hyperlipidemia Mother   . AAA (abdominal aortic aneurysm) Mother   . Renal Disease Father   . Kidney disease  Father   . CAD Father   . Cancer Father     PROSTATE  . Diabetes Brother   . Hyperlipidemia Brother   . Breast cancer Daughter 35  . Breast cancer Sister 20  . Breast cancer Sister 10  . Cancer Brother     NOS  . Bladder Cancer Brother 28  . Breast cancer Cousin     five paternal first cousins  . Lung cancer Cousin     3 paternal cousins with lung cancer  . Cancer Cousin     4 paternal first cousins with Cancer NOS  . Cancer Cousin     1 paternal cousin with oral cancer (tongue)   GYN HISTORY  Menarchal: 20 LMP: 31 (hsrectomy)  Contraceptive: a few years  HRT: 4 years.  G4P4:   ALLERGIES:  is allergic to beef-derived products; chocolate; hydrocodone; other; tramadol; ibuprofen; and niacin and related.  MEDICATIONS:  Current Outpatient Prescriptions  Medication Sig Dispense Refill  . Alpha-D-Galactosidase (BEANO PO) Take 2 capsules by mouth 2 (two) times daily as needed (ONLY TAKES WHEN TRAVELING  - takes in place of digestive enzymes).    Marland Kitchen aspirin 81 MG chewable tablet Chew 1 tablet (81 mg total) by mouth daily. 30 tablet 0  . atenolol (TENORMIN) 50 MG tablet Take 50 mg by mouth at bedtime.     Marland Kitchen b complex vitamins capsule Take 1 capsule by mouth daily.    . calcium-vitamin D (OSCAL WITH D) 500-200 MG-UNIT per tablet Take 1 tablet by mouth every morning.    . Digestive Enzymes (DIGESTIVE ENZYME PO) Take 1 capsule by mouth daily.     . fish oil-omega-3 fatty acids 1000 MG capsule Take 1 g by mouth every morning.     Marland Kitchen levothyroxine (SYNTHROID, LEVOTHROID) 112 MCG tablet Take 112 mcg by mouth every morning.     Marland Kitchen lisinopril (PRINIVIL,ZESTRIL) 10 MG tablet Take 1 tablet (10 mg total) by mouth daily. 30 tablet 1  . Multiple Vitamin (MULTIVITAMIN) tablet Take 1 tablet by mouth every morning.     . Multiple Vitamins-Minerals (VISION FORMULA PO) Take 1 capsule by mouth daily.    . vitamin E (E-400) 400 UNIT capsule Take 400 Units by mouth 3 (three) times a week. Mon Wed Fri       No current facility-administered medications for this visit.    REVIEW OF SYSTEMS:   Constitutional: Denies fevers, chills or abnormal night sweats Eyes: Denies blurriness of vision, double vision or watery eyes Ears, nose, mouth, throat, and face: Denies mucositis or sore throat Respiratory: Denies cough, dyspnea or wheezes Cardiovascular: Denies palpitation, chest discomfort or lower extremity swelling Gastrointestinal:  Denies nausea, heartburn or change in bowel habits Skin: Denies abnormal skin rashes Lymphatics: Denies new lymphadenopathy or easy bruising Neurological:Denies numbness, tingling or new weaknesses Behavioral/Psych: Mood is stable, no new changes  All other systems were reviewed with the patient and are negative.  PHYSICAL EXAMINATION: ECOG PERFORMANCE STATUS: 2  Filed Vitals:   01/24/15 1008  BP: 158/67  Pulse: 78  Temp: 97.9 F (36.6 C)  Resp: 18   Filed Weights   01/24/15 1008  Weight: 130 lb 3.2 oz (59.058 kg)    GENERAL:alert, no distress and comfortable SKIN: skin color, texture, turgor are normal, no rashes or significant lesions EYES: normal, conjunctiva are pink and non-injected, sclera clear OROPHARYNX:no exudate, no erythema and lips, buccal mucosa, and tongue normal  NECK: supple, thyroid normal size, non-tender, without nodularity LYMPH:  no palpable lymphadenopathy in the cervical, axillary or inguinal LUNGS: clear to auscultation and percussion with normal breathing effort, (+) left thoracotomy scar is well-healed  HEART: regular rate & rhythm and no murmurs and no lower extremity edema ABDOMEN:abdomen soft, non-tender and normal bowel sounds Musculoskeletal:no cyanosis of digits and no clubbing  PSYCH: alert & oriented x 3 with fluent speech NEURO: no focal motor/sensory deficits Breasts: Breast inspection showed them to be symmetrical with no nipple discharge. The surgical incision at the outer quadrant of right breast is healing  well. Palpation of the breasts and axilla revealed no obvious mass that I could appreciate.   LABORATORY DATA:  I have reviewed the data as listed Lab Results  Component Value Date   WBC 5.0 12/23/2014   HGB 12.2 12/23/2014   HCT 36.9 12/23/2014   MCV 86.5 12/23/2014   PLT 150 12/23/2014    Recent Labs  11/23/14 1302  11/27/14 0452 11/28/14 0400 11/29/14 0225 12/23/14 0833  NA 138  < > 142 142 138 140  K 4.1  < > 3.7 4.1 3.6 4.0  CL 104  < > 107 107 104  --   CO2 24  < > '29 30 27 29  ' GLUCOSE 135*  < > 108* 113* 98 105  BUN 17  < > 6 <5* 6 9.5  CREATININE 0.53  < > 0.54 0.49 0.56 0.7  CALCIUM 8.9  < > 8.5* 8.7* 8.7* 9.4  GFRNONAA >60  < > >60 >60 >60  --   GFRAA >60  < > >60 >60 >60  --   PROT 6.9  --  5.4*  --   --  6.8  ALBUMIN 3.9  --  2.7*  --   --  3.9  AST 21  --  34  --   --  15  ALT 21  --  42  --   --  14  ALKPHOS 84  --  79  --   --  91  BILITOT 0.8  --  0.8  --   --  0.53  < > = values in this interval not displayed.  GENETICS: Negative BRCAPlus testing from [facility].  The BRCAPlus gene panel offered by [facility] and includes sequencing and rearrangement analysis for the following 6 genes: BRCA1, BRCA2, CDH1, PALB2, PTEN, and TP53.  Report date is 06/21/2014.    Negative CancerNext panel testing testing.  The CancerNext gene panel offered by [employer] and includes sequencing and rearrangement analysis for the following 32 genes:   APC, ATM, BARD1, BMPR1A, BRCA1, BRCA2, BRIP1, CDH1, CDK4, CDKN2A, CHEK2, EPCAM, GREM1, MLH1, MRE11A, MSH2, MSH6, MUTYH, NBN, NF1, PALB2, PMS2, POLD1, POLE, PTEN, RAD50, RAD51D, SMAD4, SMARCA4, STK11, and TP53.  The report date is: July 15, 2014.  PAHTOLOGY REPORT  1. Breast, lumpectomy, right 07/23/2014 - INVASIVE DUCTAL CARCINOMA, SEE COMMENT. - NEGATIVE FOR LYMPH VASCULAR INVASION . - INVASIVE TUMOR IS 0.4 CM FROM THE NEAREST MARGIN (SUPERIOR AND POSTERIOR). - DUCTAL CARCINOMA IN SITU. - CALCIFICATIONS  IDENTIFIED. - PREVIOUS BIOPSY SITE IDENTIFIED. - SEE TUMOR SYNOPTIC TEMPLATE BELOW. 1 of 4 FINAL for ELITA, DAME [phone]) Diagnosis(continued) 2. Lymph node, sentinel, biopsy, right axillary - ONE LYMPH NODE, NEGATIVE FOR TUMOR (0/1). 3. Lymph node, sentinel, biopsy - ONE LYMPH NODE, NEGATIVE FOR TUMOR (0/1). Microscopic Comment 1. BREAST, INVASIVE TUMOR, WITH LYMPH NODES PRESENT Specimen, including laterality and lymph node sampling (sentinel, non-sentinel): Right breast with sentinel lymph node sampling. Procedure: Lumpectomy. Histologic type: Ductal. Grade: I of III.  1 DCIS. Tubule formation: 2 Nuclear pleomorphism: 1 Mitotic: 1 Tumor size (gross measurement): 0.8 cm. Margins: Invasive, distance to closest margin: 0.4 cm. In-situ, distance to closest margin: 0.4 cm (superior and posterior). If margin positive, focally or broadly: N/A. Lymphovascular invasion: Absent. Ductal carcinoma in situ: Present. Grade: I of III. Extensive intraductal component: Absent. Lobular neoplasia: Absent. Tumor focality: Unifocal. Treatment effect: None. If present, treatment effect in breast tissue, lymph nodes or both: N/A. Extent of tumor: Skin: N/A. Nipple: N/A. Skeletal muscle: N/A. Lymph nodes: Examined: 2 Sentinel 0 Non-sentinel 2 Total Lymph nodes with metastasis: 0. Isolated tumor cells (< 0.2 mm): N/A. Micrometastasis: (> 0.2 mm and < 2.0  mm): N/A. Macrometastasis: (> 2.0 mm): N/A. Extracapsular extension: N/A. Breast prognostic profile: Estrogen receptor: Not repeated, previous study demonstrated 100% positivity ([id number]). Progesterone receptor: Not repeated, previous study demonstrated 35% positivity ([id number]). Her 2 neu: Repeated, previous study demonstrated no amplification ([id number]). Ki-67: Not repeated, previous study demonstrated 8% proliferation rate ([id number]). Non-neoplastic breast: Previous biopsy site tissue changes, fibroadenomatoid  nodules, fibrocytic change, vascular calcifications, and   Diagnosis 12/01/2014 1. Lung, resection (segmental or lobe), Superior segment left lower lobe - ADENOCARCINOMA, WELL DIFFERENTIATED, SPANNING 1.5 CM. - THE SURGICAL RESECTION MARGINS ARE NEGATIVE FOR CARCINOMA. - SEE ONCOLOGY TABLE BELOW. 2. Lymph node, biopsy, Level 9 - THERE IS NO EVIDENCE OF CARCINOMA IN 1 OF 1 LYMPH NODE (0/1). 3. Lymph node, biopsy, Level 10 - THERE IS NO EVIDENCE OF CARCINOMA IN 1 OF 1 LYMPH NODE (0/1). 4. Lymph node, biopsy, Level 11 - THERE IS NO EVIDENCE OF CARCINOMA IN 1 OF 1 LYMPH NODE (0/1). 5. Lymph node, biopsy, Level 11 #2 - THERE IS NO EVIDENCE OF CARCINOMA IN 1 OF 1 LYMPH NODE (0/1). 6. Lymph node, biopsy, Level 7 - THERE IS NO EVIDENCE OF CARCINOMA IN 1 OF 1 LYMPH NODE (0/1).  RADIOGRAPHIC STUDIES: I have personally reviewed the radiological images as listed and agreed with the findings in the report.  Ct Chest Wo Contrast 05/25/2014    IMPRESSION:  1. Persistence and similar size of the dominant left lower lobe sub solid nodule. Adenocarcinoma is considered. Biopsy or resection should be strongly considered. These recommendations are taken from:Recommendations for the Management of Subsolid Pulmonary Nodules Detected at CT: A Statement from the [place], Radiology 2013; 266:1, 304-317.  2. Additional scattered small ground-glass nodules are stable.    Electronically Signed   By: Sherryl Barters M.D.   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  1. Right breast invasive ductal carcinoma, pT1b N0 M0 stage IA, ER positive PR positive and HER-2 negative. -She had lumpectomy with sentinel lymph node biopsy, negative surgical margins.  -Due to her family history of breast cancer in multiple family members, and multiple personal history of cancer, she has been evaluated by our gDietitian Her genetic testing for inherited breast cancer was negative -Given the positive hormone receptors, I recommend adjuvant endocrine therapy with one of the aromatase inhibitor, to reduce her risks of cancer recurrence in the future. The benefit and the potential side effects were discussed with patient, especially hot flash, muscular joint discomfort, risk of cardiovascular disease, osteoporosis, etc.,  she was still somewhat reluctant but decided to try. Given her mild arthritis, I recommend recommend Aromasin, prescription was sent to her pharmacy. She will start in the next few days. -We also discussed breast cancer surveillance planning, including annual mammogram, and a physical exam. -I encouraged her to have healthy diet and exercise regularly. -I'll obtain her last bone density scan from her primary care physician's office, she is taking calcium and vitamin D.   2. Stage I left lung adenocarcinoma,  pT1aN0M0 -She recently underwent left lower lobe segmentectomy, which showed stage I adenocarcinoma -Given the early stage of lung cancer, she does not need adjuvant chemotherapy. -She has multiple other small lung nodules, close surveillance with discussed with her. I recommend repeat a CT chest every 6-12 months for 5 years  -She will follow-up with Dr. Roxan Snyder with surveillance scan.  3. She'll continue follow-up with her primary care physician and other specialists for her other medical issues.  Plan: -She will start Aromasin in a few days -I'll see her back in  1 month's with lab -I'll try to obtain her last bones and tested scan from Dr. Tempie Donning office  All questions were answered. The patient knows to call the clinic with any problems, questions or concerns. I spent 20 minutes counseling the patient face to face. The total time spent in the appointment was 30 minutes and more than 50% was on counseling.     Melissa Merle, MD 01/24/2015 10:19 AM

## 2015-01-24 NOTE — Telephone Encounter (Signed)
Pt called and left message that she did not pick up Aromasin med due to high copay.  Spoke with pt and was informed that her copay will be $ 100.00, and her insurance will pay  $ 400.00.   Pt stated she could not afford med at the present time due to lots of financial stress.  Pt stated she was not in the " donut hole " with insurance.   Pt also stated she did not see the need to have f/u appt with Dr. Burr Medico since pt is not taking Aromasin at this time. Dr. Burr Medico notified. Pt's   Phone    [phone].

## 2015-01-26 ENCOUNTER — Other Ambulatory Visit: Payer: Self-pay | Admitting: Hematology

## 2015-01-26 ENCOUNTER — Telehealth: Payer: Self-pay | Admitting: Hematology

## 2015-01-26 ENCOUNTER — Other Ambulatory Visit: Payer: Self-pay | Admitting: *Deleted

## 2015-01-26 MED ORDER — ANASTROZOLE 1 MG PO TABS: 1.0000 mg | ORAL_TABLET | Freq: Every day | ORAL | Status: DC

## 2015-01-26 NOTE — Telephone Encounter (Signed)
I called her and discussed the AI with her again. She declined AI due to the concern of side effect. I will reschedule her follow up appointment with me to 6 months.   Melissa Snyder  01/26/2015

## 2015-01-27 ENCOUNTER — Ambulatory Visit: Payer: Medicare HMO | Admitting: Radiation Oncology

## 2015-02-10 ENCOUNTER — Ambulatory Visit
Admission: RE | Admit: 2015-02-10 | Discharge: 2015-02-10 | Disposition: A | Discharge status: Home / Self Care | Payer: Medicare HMO | Source: Ambulatory Visit | Attending: Radiation Oncology | Admitting: Radiation Oncology

## 2015-02-10 VITALS — BP 156/62 | HR 79 | Temp 98.2°F | Wt 128.0 lb

## 2015-02-10 DIAGNOSIS — C50412 Malignant neoplasm of upper-outer quadrant of left female breast: Secondary | ICD-10-CM

## 2015-02-10 NOTE — Progress Notes (Signed)
Patient for routine follow up completion of radiation to right breast on 10/07/14.Skin is good.Patient has surgical  nerve pain on left side from left lower lobe segmentectomy.has had some weight loss.No longer taking pain medication as caused constipation.Informed by surgeon office to take tylenol as patient not able to take nsaids. BP 156/62 mmHg  Pulse 79  Temp(Src) 98.2 F (36.8 C)  Wt 128 lb (58.06 kg)

## 2015-02-10 NOTE — Progress Notes (Signed)
Department of Radiation Oncology  Phone:  [phone] Fax:        [phone]   Name: Melissa Snyder MRN: [id number]  DOB: 29-Dec-1933  Date: 02/10/2015  Follow Up Visit Note  Diagnosis: Breast cancer   Staging form: Breast, AJCC 7th Edition     Pathologic stage from 10/17/2014: Stage IA (T1b, N0, cM0) - Signed by [name], MD on 10/17/2014  Diagnosis: Cancer of the female breast, Stage I adenocarcinoma of the left lower lobe status post superior segmentectomy on 11/25/2014.  Summary and Interval since last radiation: The patient's radiation treatment dates extended from 09/16/2014 to 10/07/2014. The site and dose include the right breast at 42.72 Gy at 2.67 Gy per fraction x 21 fractions. The beams and energy include the opposed tangents with reduced fields at 6 MV photons.  Interval History: [name] presents today for routine followup. She has undergone resection of her lung cancer. She tolerated this well and only has mild chest pain at the surgical site. She is accompanied by her husband. She has a follow-up appointment with Dr. Burr Medico later this month. She is due for mammograms in January or February of 2017. "It's getting better, but it still pains," the patient said in regards to recovering from her most recent surgery. She stated that she is not taking Aromasin, due to cost concern and concern of side-effects. She additionally stated that she will not attend her appointment with Dr. Burr Medico, scheduled on September 19th of 2016.  Physical Exam:  Filed Vitals:   02/10/15 1426  BP: 156/62  Pulse: 79  Temp: 98.2 F (36.8 C)  Weight: 128 lb (58.06 kg)   Pleasant elderly female. The patient is alert and oriented x3. Breast: Well healed surgical incision over the left chest. Some hyperpigmentation in the right axilla. She has no palpable abnormalities in the right and left breast with no evidence of tumor reoccurrence. She has some tenderness laterally in the left chest  wall.  IMPRESSION: [name] is a 81 y.o. female with concurrent Stage I lung and breast cancer. Currently no evidence of disease of the left lower lobe in addition to resolving acute effects of radiation to the right breast. She is recovering well from her most recent surgery that took place in February of 2016. She has decided to cease administration of the medication Aromasin. She understands the benefits of seeking recommended physical therapy for symptoms she is currently experiencing and was briefly educated on the pros of pursuing oncology rehabilitation services. She understands the importance of receiving scheduled mammograms.  PLAN: She has decided against anti-estrogen treatment and would like to discontinue follow up with Dr. Burr Medico. I will see her following her mammograms and she will see Dr. Roxan Hockey as scheduled in December for her lung cancer.  She is advised of her mammograms take place in November of 2017 and that if she is too sore, she can postpone them for one month. She is aware of her follow up appointment with radiation oncology to take place after her bilateral mammograms. She has been provided with information to contact oncology rehabilitation services. If she develops any further questions or concerns she has been encouraged to contact Dr. Pablo Ledger.   This document serves as a record of services personally performed by [name] , MD. It was created on her behalf by Lenn Cal, a trained medical scribe. The creation of this record is based on the scribe's personal observations and the provider's statements to them. This document has been  checked and approved by the attending provider.   ------------------------------------------------  [name], MD

## 2015-02-11 ENCOUNTER — Telehealth: Payer: Self-pay | Admitting: *Deleted

## 2015-02-11 NOTE — Telephone Encounter (Signed)
Called patient to inform of mammogram on 05-02-15 - arrival time - 9 am @ [facility] and her fu visit with Dr. Pablo Ledger on 05-05-15 @ 8:30 am, lvm for a return call

## 2015-02-14 ENCOUNTER — Other Ambulatory Visit: Payer: Self-pay | Admitting: Thoracic Surgery (Cardiothoracic Vascular Surgery)

## 2015-02-14 DIAGNOSIS — C3490 Malignant neoplasm of unspecified part of unspecified bronchus or lung: Secondary | ICD-10-CM

## 2015-02-15 ENCOUNTER — Ambulatory Visit (INDEPENDENT_AMBULATORY_CARE_PROVIDER_SITE_OTHER): Payer: Self-pay | Admitting: Thoracic Surgery (Cardiothoracic Vascular Surgery)

## 2015-02-15 ENCOUNTER — Ambulatory Visit
Admission: RE | Admit: 2015-02-15 | Discharge: 2015-02-15 | Disposition: A | Discharge status: Home / Self Care | Payer: Medicare HMO | Source: Ambulatory Visit | Attending: Thoracic Surgery (Cardiothoracic Vascular Surgery) | Admitting: Thoracic Surgery (Cardiothoracic Vascular Surgery)

## 2015-02-15 ENCOUNTER — Encounter: Payer: Self-pay | Admitting: Thoracic Surgery (Cardiothoracic Vascular Surgery)

## 2015-02-15 VITALS — BP 157/74 | HR 69 | Resp 20 | Ht 66.0 in | Wt 131.0 lb

## 2015-02-15 DIAGNOSIS — C3490 Malignant neoplasm of unspecified part of unspecified bronchus or lung: Secondary | ICD-10-CM

## 2015-02-15 DIAGNOSIS — Z923 Personal history of irradiation: Secondary | ICD-10-CM

## 2015-02-15 DIAGNOSIS — C50911 Malignant neoplasm of unspecified site of right female breast: Secondary | ICD-10-CM

## 2015-02-15 DIAGNOSIS — Z5189 Encounter for other specified aftercare: Secondary | ICD-10-CM

## 2015-02-15 DIAGNOSIS — C3432 Malignant neoplasm of lower lobe, left bronchus or lung: Secondary | ICD-10-CM

## 2015-02-15 NOTE — Progress Notes (Signed)
[address]       [place] [address]             [phone]       HPI:  Melissa Snyder returns for a scheduled 3 month follow-up visit.  She is a 81 year old woman who had a left lower lobe superior segmentectomy in June for stage IA non-small cell carcinoma. Postoperative course was uncomplicated. I saw her in the office a few weeks postop. She was "miserable" with pain at that time. She now says she's just moderately uncomfortable. She does still have some pain from time to time. She is not taking anything for that specifically. She's not had any problems with her breathing.  She has been followed by Dr. Pablo Ledger and Dr. Burr Medico for breast cancer.  Past Medical History  Diagnosis Date  . Chest pain   . Bundle branch block left   . Hypothyroid   . Hypertension   . Hyperlipidemia   . GERD (gastroesophageal reflux disease)   . Melanoma in situ of face JUNE 2015    LEFT CHEEK  . Breast cancer 05/2014    ER+/PR+/Her2-  . Family history of breast cancer   . Family history of bladder cancer   . Family history of prostate cancer   . Blind left eye 10/2013    cva  . Skin cancer   . Migraine     reports only having one  . Osteoarthritis   . Stroke 2015    Blind in left eye as a result  . Lung nodule   . History of blood transfusion     age 81  . Meningioma     brain lining  . Radiation 09/16/14-10/07/14    Right  Breast  21 fractions    Current Outpatient Prescriptions  Medication Sig Dispense Refill  . aspirin 81 MG chewable tablet Chew 1 tablet (81 mg total) by mouth daily. 30 tablet 0  . atenolol (TENORMIN) 50 MG tablet Take 50 mg by mouth at bedtime.     Marland Kitchen b complex vitamins capsule Take 1 capsule by mouth daily.    . calcium-vitamin D (OSCAL WITH D) 500-200 MG-UNIT per tablet Take 1 tablet by mouth every morning.    . fish oil-omega-3 fatty acids 1000 MG capsule Take 1 g by mouth every morning.     Marland Kitchen levothyroxine (SYNTHROID, LEVOTHROID) 112 MCG  tablet Take 112 mcg by mouth every morning.     Marland Kitchen lisinopril (PRINIVIL,ZESTRIL) 10 MG tablet Take 1 tablet (10 mg total) by mouth daily. 30 tablet 1  . Multiple Vitamin (MULTIVITAMIN) tablet Take 1 tablet by mouth every morning.     . Multiple Vitamins-Minerals (VISION FORMULA PO) Take 1 capsule by mouth daily.    . vitamin E (E-400) 400 UNIT capsule Take 400 Units by mouth 3 (three) times a week. Mon Wed Fri     No current facility-administered medications for this visit.    Physical Exam BP 157/74 mmHg  Pulse 69  Resp 20  Ht _0  (1.676 m)  Wt 131 lb (59.421 kg)  BMI 21.15 kg/m2  SpO24 36% 81 year old woman in no acute distress Alert and oriented 3 with no focal neurologic deficit No cervical or subclavicular adenopathy Lungs clear with equal breath sounds bilaterally Incisions well healed  Diagnostic Tests: I personally reviewed her chest x-ray. It shows good aeration of the lungs bilaterally.  Impression: 81 year old woman who had a thoracoscopic left lower lobe superior  segmentectomy for a stage IA non-small cell carcinoma 3 months ago. She is recovering well. She does still have some incisional discomfort, which is not uncommon. She's not requiring narcotics for that.  Her blood pressure was elevated today. She is on atenolol and lisinopril. I asked her to follow up with Dr.Perini, her primary, regarding that issue.  She continues to follow with Dr. Pablo Ledger regarding her breast cancer.  I will be happy to follow her for lung cancer, but will defer to Dr. Pablo Ledger or Dr. Burr Medico if they wish. She will need CT scans every 6 months for the first 2 years and then annually after that.  Plan: Return in 3 months with CT of chest for 6 month follow-up visit  [name], MD [facility] [phone]

## 2015-02-21 ENCOUNTER — Other Ambulatory Visit: Payer: Medicare HMO

## 2015-02-21 ENCOUNTER — Ambulatory Visit: Payer: Medicare HMO | Admitting: Hematology

## 2015-03-15 DIAGNOSIS — Z23 Encounter for immunization: Secondary | ICD-10-CM | POA: Diagnosis not present

## 2015-03-17 DIAGNOSIS — Z08 Encounter for follow-up examination after completed treatment for malignant neoplasm: Secondary | ICD-10-CM | POA: Diagnosis not present

## 2015-03-17 DIAGNOSIS — D225 Melanocytic nevi of trunk: Secondary | ICD-10-CM | POA: Diagnosis not present

## 2015-03-17 DIAGNOSIS — L02828 Furuncle of other sites: Secondary | ICD-10-CM | POA: Diagnosis not present

## 2015-03-17 DIAGNOSIS — Z85820 Personal history of malignant melanoma of skin: Secondary | ICD-10-CM | POA: Diagnosis not present

## 2015-03-17 DIAGNOSIS — D1801 Hemangioma of skin and subcutaneous tissue: Secondary | ICD-10-CM | POA: Diagnosis not present

## 2015-04-01 DIAGNOSIS — H2513 Age-related nuclear cataract, bilateral: Secondary | ICD-10-CM | POA: Diagnosis not present

## 2015-04-06 DIAGNOSIS — H25811 Combined forms of age-related cataract, right eye: Secondary | ICD-10-CM | POA: Diagnosis not present

## 2015-04-06 DIAGNOSIS — H2511 Age-related nuclear cataract, right eye: Secondary | ICD-10-CM | POA: Diagnosis not present

## 2015-05-02 ENCOUNTER — Ambulatory Visit
Admission: RE | Admit: 2015-05-02 | Discharge: 2015-05-02 | Disposition: A | Discharge status: Home / Self Care | Payer: Medicare HMO | Source: Ambulatory Visit | Attending: Radiation Oncology | Admitting: Radiation Oncology

## 2015-05-02 DIAGNOSIS — C50412 Malignant neoplasm of upper-outer quadrant of left female breast: Secondary | ICD-10-CM

## 2015-05-02 DIAGNOSIS — R928 Other abnormal and inconclusive findings on diagnostic imaging of breast: Secondary | ICD-10-CM | POA: Diagnosis not present

## 2015-05-04 ENCOUNTER — Other Ambulatory Visit: Payer: Self-pay | Admitting: Thoracic Surgery (Cardiothoracic Vascular Surgery)

## 2015-05-04 DIAGNOSIS — C3490 Malignant neoplasm of unspecified part of unspecified bronchus or lung: Secondary | ICD-10-CM

## 2015-05-04 NOTE — Progress Notes (Addendum)
   Department of Radiation Oncology  Phone:  [phone] Fax:        [phone]   Name: APPOLLONIA KLEE MRN: [id number]  DOB: 1933-08-10  Date: 05/05/2015  Follow Up Visit Note   Diagnosis:  Stage I infiltrating ductal carcinoma of the right breast  Stage I adenocarcinoma of the left lower lobe status post superior segmentectomy on 11/25/2014. Melanoma of the left cheek.   Summary and Interval since last radiation: The patient's radiation treatment dates extended from 09/16/2014 to 10/07/2014. The site and dose include the right breast at 42.72 Gy at 2.67 Gy per fraction x 21 fractions.  Interval History: Dhruti presents today for routine followup. She had mammograms on November 28th which were negative for recurrence and one year follow up was suggested. She has CT of the chest ordered and scheduled for her visit with Dr. Roxan Hockey later this month. She is doing well from a respiratory standpoint. She has no breast related complaints. She has declined anti-estrogen therapy.She is accompanied by her husband. She has some chest wall pain and soreness on the left side related her her thoracotomy. This is worse when she uses her left arm.  Physical Exam:  Filed Vitals:   05/05/15 0841  BP: 158/71  Pulse: 71  Temp: 97.4 F (36.3 C)  TempSrc: Oral  Resp: 12  Weight: 133 lb 12.8 oz (60.691 kg)  SpO2: 100%   Well healed scar in lateral aspect of left breast. No palpable abnormalities. No palpable cervical, supraclavicular or axillary adenopathy. No palpable abnormalities of the right breast.   IMPRESSION: Tabathia is a 81 y.o. female with Stage I right breast cancer, no evidence of disease. Stage I status post superior segmentectomy, currently no evidence of disease.  PLAN: She has follow up and CT scan with Dr. Roxan Hockey in December. She can then be seen in survivorship by Mike Craze, NP with a CT of the chest in June. She will need a CT of the chest every 6 months for 2 years  and then annually after that. She will also require yearly mammograms. I gave her a copy of our survivorship brochure.   I also placed her on 100 mg of gabapentin at night for her chest wall pain. She has taken this in the past and was unable to tolerate a high dose.  We will start her low and see if this helps.   ------------------------------------------------  [name], MD  This document serves as a record of services personally performed by [name], MD. It was created on her behalf by Arlyce Harman, a trained medical scribe. The creation of this record is based on the scribe's personal observations and the provider's statements to them. This document has been checked and approved by the attending provider.

## 2015-05-05 ENCOUNTER — Encounter: Payer: Self-pay | Admitting: Radiation Oncology

## 2015-05-05 ENCOUNTER — Ambulatory Visit
Admission: RE | Admit: 2015-05-05 | Discharge: 2015-05-05 | Disposition: A | Discharge status: Home / Self Care | Payer: Medicare HMO | Source: Ambulatory Visit | Attending: Radiation Oncology | Admitting: Radiation Oncology

## 2015-05-05 VITALS — BP 158/71 | HR 71 | Temp 97.4°F | Resp 12 | Wt 133.8 lb

## 2015-05-05 DIAGNOSIS — C50412 Malignant neoplasm of upper-outer quadrant of left female breast: Secondary | ICD-10-CM

## 2015-05-05 MED ORDER — GABAPENTIN 100 MG PO CAPS: 300.0000 mg | ORAL_CAPSULE | Freq: Every day | ORAL | Status: DC

## 2015-05-05 NOTE — Progress Notes (Signed)
She is currently in no pain. Reports "nerve pain" to LEFT breast, since June 2016.   Pt right breast- skin is warm dry and intact. Reports breast tenderness to left breast. Denies discomfort to right breast.   Pt denies edema. Pt reports not using cream to treatment site BP 158/71 mmHg  Pulse 71  Temp(Src) 97.4 F (36.3 C) (Oral)  Resp 12  Wt 133 lb 12.8 oz (60.691 kg)  SpO2 100%  Pt reports: Yes No Comments  Tamoxifen '[]'$  '[x]'$    Arimidex '[]'$  '[x]'$    Mammogram '[x]'$   Date:05/02/15 '[]'$    Dr Burr Medico: 01/26/15

## 2015-05-06 ENCOUNTER — Telehealth: Payer: Self-pay | Admitting: Adult Health

## 2015-05-06 ENCOUNTER — Telehealth: Payer: Self-pay | Admitting: *Deleted

## 2015-05-06 NOTE — Telephone Encounter (Signed)
CALLED PATIENT TO INFORM OF CT FOR 11-04-14 - ARRIVAL TIME - 10:45 AM @ [facility] AND HER SURVIVORSHIP APPT. ON 11-10-14 @ 1:30 PM WITH GRETCHEN DAWSON, SPOKE WITH PATIENT AND SHE IS AWARE OF THESE APPTS.

## 2015-05-06 NOTE — Telephone Encounter (Signed)
I called to introduce myself and let Melissa Snyder know that I would seeing her for her breast and lung cancer surveillance, as request by Dr. Pablo Ledger.  She will have a CT of the chest in 11/2015 and then will see me in clinic.  I briefly let her know my role in her care and I would be helping manage any survivorship concerns.  I will mail her a copy of her upcoming appointments, along with my business card, and encouraged her to call me with any questions or concerns before her next visit here. I look forward to participating in her care.   Mike Craze, NP [place] [phone]

## 2015-05-12 DIAGNOSIS — R51 Headache: Secondary | ICD-10-CM | POA: Diagnosis not present

## 2015-05-12 DIAGNOSIS — Z961 Presence of intraocular lens: Secondary | ICD-10-CM | POA: Diagnosis not present

## 2015-05-16 DIAGNOSIS — H5462 Unqualified visual loss, left eye, normal vision right eye: Secondary | ICD-10-CM | POA: Diagnosis not present

## 2015-05-16 DIAGNOSIS — Z08 Encounter for follow-up examination after completed treatment for malignant neoplasm: Secondary | ICD-10-CM | POA: Diagnosis not present

## 2015-05-16 DIAGNOSIS — D329 Benign neoplasm of meninges, unspecified: Secondary | ICD-10-CM | POA: Diagnosis not present

## 2015-05-16 DIAGNOSIS — Z86011 Personal history of benign neoplasm of the brain: Secondary | ICD-10-CM | POA: Diagnosis not present

## 2015-05-16 DIAGNOSIS — Z85820 Personal history of malignant melanoma of skin: Secondary | ICD-10-CM | POA: Diagnosis not present

## 2015-05-16 DIAGNOSIS — D320 Benign neoplasm of cerebral meninges: Secondary | ICD-10-CM | POA: Diagnosis not present

## 2015-05-31 ENCOUNTER — Encounter: Payer: Self-pay | Admitting: Thoracic Surgery (Cardiothoracic Vascular Surgery)

## 2015-05-31 ENCOUNTER — Ambulatory Visit (INDEPENDENT_AMBULATORY_CARE_PROVIDER_SITE_OTHER): Payer: Medicare HMO | Admitting: Thoracic Surgery (Cardiothoracic Vascular Surgery)

## 2015-05-31 ENCOUNTER — Ambulatory Visit
Admission: RE | Admit: 2015-05-31 | Discharge: 2015-05-31 | Disposition: A | Discharge status: Home / Self Care | Payer: Medicare HMO | Source: Ambulatory Visit | Attending: Thoracic Surgery (Cardiothoracic Vascular Surgery) | Admitting: Thoracic Surgery (Cardiothoracic Vascular Surgery)

## 2015-05-31 VITALS — BP 157/80 | HR 74 | Resp 16 | Ht 66.0 in | Wt 131.0 lb

## 2015-05-31 DIAGNOSIS — C3432 Malignant neoplasm of lower lobe, left bronchus or lung: Secondary | ICD-10-CM | POA: Diagnosis not present

## 2015-05-31 DIAGNOSIS — Z09 Encounter for follow-up examination after completed treatment for conditions other than malignant neoplasm: Secondary | ICD-10-CM

## 2015-05-31 DIAGNOSIS — R918 Other nonspecific abnormal finding of lung field: Secondary | ICD-10-CM | POA: Diagnosis not present

## 2015-05-31 NOTE — Progress Notes (Signed)
[address]       [place] [address]             [phone]       HPI: Melissa Snyder returns today for scheduled 6 month follow-up visit.  She is a 81 year old Snyder who had a left lower lobe superior segmentectomy on 11/25/2014 for a stage IA non-small cell carcinoma. She had a great deal of pain initially. Melissa Snyder last saw Melissa Snyder in the office in September and at that time she was still having pain although it had improved.  At one point she was prescribed gabapentin for Melissa Snyder pain, but she was not aware of the prescription being placed and never filled it.   She says she still occasionally has the pains. It's not predictable other than it does seem to be aggravated by using Melissa Snyder left arm. She is not taking anything for it.  She hasn't had any fevers, chills or sweats. She complains of a nagging nonproductive cough. She's also had a lot of sinus congestion recently.  Past Medical History  Diagnosis Date  . Chest pain   . Bundle branch block left   . Hypothyroid   . Hypertension   . Hyperlipidemia   . GERD (gastroesophageal reflux disease)   . Melanoma in situ of face ([place]) JUNE 2015    LEFT CHEEK  . Breast cancer ([place]) 05/2014    ER+/PR+/Her2-  . Family history of breast cancer   . Family history of bladder cancer   . Family history of prostate cancer   . Blind left eye 10/2013    cva  . Skin cancer   . Migraine     reports only having one  . Osteoarthritis   . Stroke [facility]) 2015    Blind in left eye as a result  . Lung nodule   . History of blood transfusion     age 89  . Meningioma ([place])     brain lining  . Radiation 09/16/14-10/07/14    Right  Breast  21 fractions      Current Outpatient Prescriptions  Medication Sig Dispense Refill  . aspirin 81 MG chewable tablet Chew 1 tablet (81 mg total) by mouth daily. 30 tablet 0  . atenolol (TENORMIN) 50 MG tablet Take 50 mg by mouth at bedtime.     Marland Kitchen b complex vitamins capsule Take 1 capsule by mouth daily.     . calcium-vitamin D (OSCAL WITH D) 500-200 MG-UNIT per tablet Take 1 tablet by mouth every morning.    . fish oil-omega-3 fatty acids 1000 MG capsule Take 1 g by mouth every morning.     Marland Kitchen levothyroxine (SYNTHROID, LEVOTHROID) 112 MCG tablet Take 112 mcg by mouth every morning.     Marland Kitchen lisinopril (PRINIVIL,ZESTRIL) 10 MG tablet Take 1 tablet (10 mg total) by mouth daily. 30 tablet 1  . Multiple Vitamin (MULTIVITAMIN) tablet Take 1 tablet by mouth every morning.     . Multiple Vitamins-Minerals (VISION FORMULA PO) Take 1 capsule by mouth daily.    . vitamin E (E-400) 400 UNIT capsule Take 400 Units by mouth 3 (three) times a week. Mon Wed Fri     No current facility-administered medications for this visit.    Physical Exam BP 157/80 mmHg  Pulse 74  Resp 16  Ht '5\' 6"'  (1.676 m)  Wt 131 lb (59.421 kg)  BMI 21.15 kg/m2  SpO43 Melissa% Melissa Snyder in no acute distress Alert and oriented  3 with no focal deficits No cervical or subclavicular adenopathy Lungs diminished bilaterally, no wheezing Cardiac regular rate and rhythm normal S1 and S2  Diagnostic Tests: CT CHEST WITHOUT CONTRAST  TECHNIQUE: Multidetector CT imaging of the chest was performed following the standard protocol without IV contrast.  COMPARISON: 09/27/2014 chest CT.  FINDINGS: Mediastinum/Nodes: Normal heart size. No pericardial fluid/thickening. Left anterior descending coronary atherosclerosis. Great vessels are normal in course and caliber. Normal visualized thyroid. Normal esophagus. Right axillary surgical clips are noted. No axillary lymphadenopathy. No appreciable pathologically enlarged mediastinal or hilar lymph nodes on this noncontrast CT.  Lungs/Pleura: No pneumothorax. No pleural effusion. There is stable moderate biapical pleural-parenchymal scarring with faint internal calcification involving the right greater than left lung apices. There is new subpleural reticulation in the anterior right  upper lobe, likely representing mild evolving radiation fibrosis. There is a 5 x 4 mm ground-glass pulmonary nodule in the right upper lobe (series 5/image 17), stable since 11/12/2013 chest CT. There is a 5 x 4 mm ground-glass pulmonary nodule in the posterior right upper lobe (series 5/ image 18), stable since 11/12/2013. There is a 3 mm solid pulmonary nodule in the right upper lobe (series 5/image 20), stable since 11/12/2013. There are 4 mm ground-glass pulmonary nodules in the anterior right lower lobe (series 5/ images 19 and 36), stable since 11/12/2013. There is a 3 mm solid pulmonary nodule in the medial right lower lobe (series 5/image 34), stable since 11/12/2013. There is a 7 x 6 mm ground-glass pulmonary nodule in the left upper lobe (series 5/image 14), stable since 11/12/2013. A 4 mm left upper lobe ground-glass pulmonary nodule (series 5/image 17) is stable since 11/12/2013. There has been interval superior segmentectomy in the left lower lobe, with no evidence of a locally recurrent lung mass. In the posterior basilar left lower lobe, there is a new 1.1 x 0.8 cm nodular focus of consolidation with surrounding thin parenchymal band, favor a benign nodular focus of scarring. Otherwise no acute consolidative airspace disease or new significant pulmonary nodules.  Upper abdomen: Unremarkable.  Musculoskeletal: No aggressive appearing focal osseous lesions. Stable T12 vertebral hemangioma. Moderate degenerative changes in the thoracic spine.  IMPRESSION: 1. No evidence of local recurrence at the superior segmentectomy site in the left lower lobe. 2. New small nodular focus of consolidation in the posterior basilar left lower lobe with surrounding thin parenchymal band, favor a small benign nodular focus of scarring. Recommend attention to this focus on follow-up chest CT in 3 months. 3. Additional subcentimeter ground-glass and solid pulmonary nodules in both  lungs, for which 18 month stability has been demonstrated, suggesting a benign etiology. Annual chest CT surveillance for a minimum of 3 years is recommended for the dominant 7 mm ground-glass pulmonary nodule in the left upper lobe. This recommendation follows the consensus statement: Recommendations for the Management of Subsolid Pulmonary Nodules Detected at CT: A Statement from the [employer] as published in Radiology 2013; 266:304-317. 4. No thoracic lymphadenopathy. 5. Coronary atherosclerosis .   Electronically Signed  By: Ilona Sorrel M.D.  On: 05/31/2015 12:00  Melissa Snyder personally reviewed Melissa Snyder CT chest and concur with the findings as noted above  Impression: Melissa Snyder who had a left lower lobe superior segmentectomy for stage IA non-small cell carcinoma 6 months ago. She also has multiple other nodules and groundglass opacities bilaterally. All of these are unchanged. There is a new finding in the left lower lobe that is likely scarring based on appearance,  however, radiology recommended a repeat CT at 3 months to further evaluate this area.  She continues to have some neurogenic pain. Melissa Snyder offered to give Melissa Snyder a prescription for gabapentin, but she thinks she can handle the pain knowing that it is not an indicator of recurrent cancer.  Melissa Snyder blood pressure was again elevated today. She will follow-up with Dr. Joylene Draft regarding that issue.  Plan: Return in 3 months with CT chest  Melissa Snyder spent 15 minutes with Melissa Snyder during this visit, greater than 50% was spent in counseling. [name], MD [facility] [phone]

## 2015-06-08 ENCOUNTER — Ambulatory Visit
Admission: RE | Admit: 2015-06-08 | Discharge: 2015-06-08 | Disposition: A | Discharge status: Home / Self Care | Payer: Medicare HMO | Source: Ambulatory Visit | Attending: Radiation Oncology | Admitting: Radiation Oncology

## 2015-06-08 ENCOUNTER — Encounter: Payer: Self-pay | Admitting: Radiation Oncology

## 2015-06-08 VITALS — BP 158/64 | HR 70 | Temp 98.0°F | Ht 66.0 in | Wt 134.6 lb

## 2015-06-08 DIAGNOSIS — L720 Epidermal cyst: Secondary | ICD-10-CM | POA: Diagnosis not present

## 2015-06-08 DIAGNOSIS — C50412 Malignant neoplasm of upper-outer quadrant of left female breast: Secondary | ICD-10-CM | POA: Diagnosis not present

## 2015-06-08 NOTE — Progress Notes (Signed)
Melissa Snyder is here today for F/U visit for history of right breast cancer and and left lower lobe stage 1 adenocarcinoma.  Denies pain today.  Appetite is improving.  Energy level is normal  No SOB or swallowing problems. BP 158/64 mmHg  Pulse 70  Temp(Src) 98 F (36.7 C) (Oral)  Ht '5\' 6"'$  (1.676 m)  Wt 134 lb 9.6 oz (61.054 kg)  BMI 21.74 kg/m2  SpO2 98%    Wt Readings from Last 3 Encounters:  06/08/15 134 lb 9.6 oz (61.054 kg)  05/31/15 131 lb (59.421 kg)  05/05/15 133 lb 12.8 oz (60.691 kg)

## 2015-06-08 NOTE — Progress Notes (Signed)
   Department of Radiation Oncology  Phone:  [phone] Fax:        [phone]  Name: Melissa Snyder MRN: [id number]  DOB: June 25, 1933  Date: 06/08/2015  Follow Up Visit Note  Diagnosis:  Stage I infiltrating ductal carcinoma of the right breast  Stage I adenocarcinoma of the left lower lobe status post superior segmentectomy on 11/25/2014. Melanoma of the left cheek.   Summary and Interval since last radiation: The patient's radiation treatment dates extended from 09/16/2014 to 10/07/2014. The site and dose include the right breast at 42.72 Gy at 2.67 Gy per fraction x 21 fractions.  Interval History: Melissa Snyder is here today for F/U visit for history of right breast cancer and and left lower lobe stage 1 adenocarcinoma. The patient had questions regarding her lung cancer diagnosis. She had a recent CT scan which sowed a new lower lobe nodule and she wasn't sure about that. 3 month follow scan has been recommended and scheduled by Dr. Roxan Hockey.   Denies pain today. Appetite is improving. Energy level is normal. No SOB or swallowing problems.  Post thoracotomy pains improving. Signed up for [employer] program.   Physical Exam:  Filed Vitals:   06/08/15 0810  BP: 158/64  Pulse: 70  Temp: 98 F (36.7 C)  TempSrc: Oral  Height: '5\' 6"'$  (1.676 m)  Weight: 134 lb 9.6 oz (61.054 kg)  SpO2: 98%  Alert and oriented.   IMPRESSION: Melissa Snyder is a 82 y.o. female with Stage I right breast cancer, no evidence of disease. Stage I status post superior segmentectomy, currently no evidence of disease.  PLAN: She will see Dr.Hendrickson with a CT scan in 3 months. And then survivorship in June. I will see her back as needed.   ------------------------------------------------  [name], MD  This document serves as a record of services personally performed by [name], MD. It was created on her behalf by Derek Mound, a trained medical scribe. The creation of this record is  based on the scribe's personal observations and the provider's statements to them. This document has been checked and approved by the attending provider.

## 2015-07-05 ENCOUNTER — Telehealth: Payer: Self-pay | Admitting: *Deleted

## 2015-07-05 NOTE — Telephone Encounter (Signed)
Called patient  To inform of CT scan for 08-11-15, spoke with patient and she is aware of this test.

## 2015-08-03 ENCOUNTER — Other Ambulatory Visit: Payer: Self-pay | Admitting: Thoracic Surgery (Cardiothoracic Vascular Surgery)

## 2015-08-03 DIAGNOSIS — C3490 Malignant neoplasm of unspecified part of unspecified bronchus or lung: Secondary | ICD-10-CM

## 2015-08-11 ENCOUNTER — Ambulatory Visit (HOSPITAL_COMMUNITY)
Admission: RE | Admit: 2015-08-11 | Discharge: 2015-08-11 | Disposition: A | Discharge status: Home / Self Care | Payer: Medicare HMO | Source: Ambulatory Visit | Attending: Radiation Oncology | Admitting: Radiation Oncology

## 2015-08-11 DIAGNOSIS — C3492 Malignant neoplasm of unspecified part of left bronchus or lung: Secondary | ICD-10-CM | POA: Diagnosis not present

## 2015-08-11 DIAGNOSIS — C50412 Malignant neoplasm of upper-outer quadrant of left female breast: Secondary | ICD-10-CM | POA: Diagnosis not present

## 2015-08-11 DIAGNOSIS — I700 Atherosclerosis of aorta: Secondary | ICD-10-CM | POA: Insufficient documentation

## 2015-08-11 DIAGNOSIS — H5442 Blindness, left eye, normal vision right eye: Secondary | ICD-10-CM | POA: Diagnosis not present

## 2015-08-11 DIAGNOSIS — R918 Other nonspecific abnormal finding of lung field: Secondary | ICD-10-CM | POA: Diagnosis not present

## 2015-08-11 DIAGNOSIS — I2510 Atherosclerotic heart disease of native coronary artery without angina pectoris: Secondary | ICD-10-CM | POA: Insufficient documentation

## 2015-08-11 DIAGNOSIS — C3432 Malignant neoplasm of lower lobe, left bronchus or lung: Secondary | ICD-10-CM | POA: Diagnosis not present

## 2015-08-11 DIAGNOSIS — C50911 Malignant neoplasm of unspecified site of right female breast: Secondary | ICD-10-CM | POA: Diagnosis not present

## 2015-08-17 ENCOUNTER — Telehealth: Payer: Self-pay | Admitting: *Deleted

## 2015-08-17 NOTE — Telephone Encounter (Signed)
Spoke with Mrs. Wragg let her know that her ct chest scan looked good per Dr. Pablo Ledger.  Reminded her that she has an appointment with Dr. Roxan Hockey in a few weeks and that she will she Elzie Rings here  At the [facility] in June.

## 2015-08-25 DIAGNOSIS — E038 Other specified hypothyroidism: Secondary | ICD-10-CM | POA: Diagnosis not present

## 2015-08-25 DIAGNOSIS — R42 Dizziness and giddiness: Secondary | ICD-10-CM | POA: Diagnosis not present

## 2015-08-25 DIAGNOSIS — I10 Essential (primary) hypertension: Secondary | ICD-10-CM | POA: Diagnosis not present

## 2015-08-25 DIAGNOSIS — R51 Headache: Secondary | ICD-10-CM | POA: Diagnosis not present

## 2015-08-30 ENCOUNTER — Other Ambulatory Visit: Payer: Medicare HMO

## 2015-08-30 ENCOUNTER — Encounter: Payer: Self-pay | Admitting: Thoracic Surgery (Cardiothoracic Vascular Surgery)

## 2015-08-30 ENCOUNTER — Ambulatory Visit (INDEPENDENT_AMBULATORY_CARE_PROVIDER_SITE_OTHER): Payer: Medicare HMO | Admitting: Thoracic Surgery (Cardiothoracic Vascular Surgery)

## 2015-08-30 VITALS — BP 149/70 | HR 69 | Resp 20 | Ht 66.0 in | Wt 134.0 lb

## 2015-08-30 DIAGNOSIS — R918 Other nonspecific abnormal finding of lung field: Secondary | ICD-10-CM | POA: Diagnosis not present

## 2015-08-30 DIAGNOSIS — Z09 Encounter for follow-up examination after completed treatment for conditions other than malignant neoplasm: Secondary | ICD-10-CM | POA: Diagnosis not present

## 2015-08-30 DIAGNOSIS — C3432 Malignant neoplasm of lower lobe, left bronchus or lung: Secondary | ICD-10-CM | POA: Diagnosis not present

## 2015-08-30 NOTE — Progress Notes (Signed)
[address]       ,[place] [address]             [phone]       HPI: Melissa Snyder returns for a scheduled 3 month follow-up visit.  She is an 82 year old woman who had a left lower lobe superior segmentectomy for a stage IA non-small cell carcinoma in June 2016. She had a lot of pain initially postoperatively but that was better when I last saw her in the office. She had a CT in December which showed a groundglass opacity in the left upper lobe and a 3 month interval CT was recommended to assess for persistence.  She brought a copy of her CT report to the office and has lots of questions.  She has some occasional pain in her left chest, but is not having to take anything for that. She has not had any issues related to her breathing recently. She feels well. Her weight is stable. Her appetite is fair. She has not had any unusual headaches or visual changes recently.  Past Medical History  Diagnosis Date  . Chest pain   . Bundle branch block left   . Hypothyroid   . Hypertension   . Hyperlipidemia   . GERD (gastroesophageal reflux disease)   . Melanoma in situ of face ([place]) JUNE 2015    LEFT CHEEK  . Breast cancer ([place]) 05/2014    ER+/PR+/Her2-  . Family history of breast cancer   . Family history of bladder cancer   . Family history of prostate cancer   . Blind left eye 10/2013    cva  . Skin cancer   . Migraine     reports only having one  . Osteoarthritis   . Stroke [facility]) 2015    Blind in left eye as a result  . Lung nodule   . History of blood transfusion     age 82  . Meningioma ([place])     brain lining  . Radiation 09/16/14-10/07/14    Right  Breast  21 fractions      Current Outpatient Prescriptions  Medication Sig Dispense Refill  . aspirin 81 MG chewable tablet Chew 1 tablet (81 mg total) by mouth daily. 30 tablet 0  . atenolol (TENORMIN) 50 MG tablet Take 50 mg by mouth at bedtime.     Marland Kitchen b complex vitamins capsule Take 1 capsule by  mouth daily.    . calcium-vitamin D (OSCAL WITH D) 500-200 MG-UNIT per tablet Take 1 tablet by mouth every morning.    . fish oil-omega-3 fatty acids 1000 MG capsule Take 1 g by mouth every morning.     Marland Kitchen levothyroxine (SYNTHROID, LEVOTHROID) 112 MCG tablet Take 112 mcg by mouth every morning.     Marland Kitchen lisinopril (PRINIVIL,ZESTRIL) 10 MG tablet Take 1 tablet (10 mg total) by mouth daily. (Patient taking differently: Take 10 mg by mouth 2 (two) times daily. ) 30 tablet 1  . Multiple Vitamin (MULTIVITAMIN) tablet Take 1 tablet by mouth every morning.     . Multiple Vitamins-Minerals (VISION FORMULA PO) Take 1 capsule by mouth daily.    . vitamin E (E-400) 400 UNIT capsule Take 400 Units by mouth 3 (three) times a week. Mon Wed Fri     No current facility-administered medications for this visit.    Physical Exam BP 149/70 mmHg  Pulse 69  Resp 20  Ht '5\' 6"'  (1.676 m)  Wt 134 lb (  60.782 kg)  BMI 21.64 kg/m2  SpO77 33% 82 year old woman in no acute distress Alert and oriented 3 with no focal deficits Cardiac regular rate and rhythm normal S1 and S2 Lungs clear with equal breath sounds bilaterally Incisions well healed No cervical or supraclavicular adenopathy  Diagnostic Tests: CT CHEST WITHOUT CONTRAST  TECHNIQUE: Multidetector CT imaging of the chest was performed following the standard protocol without IV contrast.  COMPARISON: 05/31/2015  FINDINGS: Mediastinum: The heart size is normal. There is no pleural effusion or edema identified. The trachea appears patent and is midline. Normal appearance of the esophagus. No mediastinal or hilar adenopathy identified. Aortic atherosclerosis noted. Calcifications within the LAD and left circumflex coronary artery noted.  Lungs/Pleura: Biapical scarring is identified scratch set of bilateral areas of pleural and parenchymal scarring are identified and appear upper lobe predominant. Stable appearance of left superior segmentectomy  site in the left lower lobe. Stable ground-glass attenuating nodule in the left upper lobe measure 8 mm, image 13 of series 3. Stable left upper lobe ground-glass attenuating nodule scar within the posterior left lower lobe is unchanged, image 48 series 7. Measuring 4 mm, image number 15 of series 7. 5 mm right upper lobe ground-glass attenuating nodule is unchanged. Scar within the left lower lobe with stable 11 mm nodular focus is unchanged, image number 49 of series 7.  Upper Abdomen: The visualized portions of the liver and the spleen appear normal. The adrenal glands are unremarkable. The visualized portions of the pancreas are normal.  Musculoskeletal: The bones appear osteopenic. There is no aggressive lytic or sclerotic bone lesions identified.  IMPRESSION: 1. No specific findings identified to suggest residual or recurrence of tumor at the superior segmentectomy site in the left lower lobe. 2. Stable small nodular focus of consolidation within the posterior basal left lower lobe with surrounding thin parenchymal band, favor a small benign nodular focus of scarring. 3. Multiple small ground-glass attenuating nodules are unchanged from previous exam. Continued annual surveillance for a minimum of 3 years is recommended for the dominant 8 mm ground-glass nodule in the left upper lobe. Initial follow-up by chest CT without contrast is recommended in 3 months to confirm persistence. This recommendation follows the consensus statement: Recommendations for the Management of Subsolid Pulmonary Nodules Detected at CT: A Statement from the Fleischner Society as published in Radiology 2013; 266:304-317. 4. Aortic atherosclerosis and coronary artery calcifications.   Electronically Signed  By: Kerby Moors M.D.  On: 08/11/2015 14:00  I personally reviewed the CT chest and concur with findings noted above. Of note the groundglass opacity in the left upper lobe was present  3 months ago and was the findings indicated this 3 month interval scan. I do not think she needs another scan in 3 months. We will plan to see her back in 6 months with a CT.  Impression: 82 year old woman who had a left lower lobe superior segmentectomy in June 2016 for a stage IA non-small cell carcinoma. She was doing well at this point in time with no evidence recurrent disease.  Her CT shows multiple 3-4 mm lung nodules bilaterally. There is scarring related to her previous surgery. There also is scarring likely due to previous infections. There is a small groundglass opacity in the left upper lobe that is relatively stable (within a millimeter) of the scan in December. We'll plan to scan her again in about 6 months to look at that site again.  Her blood pressure is mildly elevated but better than  it had been previously. Dr. Joylene Draft follows her for that.  Plan: Return in 6 months with CT chest.  I spent 15 minutes face-to-face with Melissa Snyder during this visit, greater than 50% spent in counseling  [name], MD [facility] [phone]

## 2015-09-14 ENCOUNTER — Telehealth: Payer: Self-pay | Admitting: Adult Health

## 2015-09-14 NOTE — Telephone Encounter (Signed)
I left a voicemail for Ms. Watterson letting her know that I needed to reschedule her survivorship appt with me, which was previously scheduled for 11/10/15, as I will be out of the office on that day.    In the voicemail, I let her know that I will mail her a copy of her new appt calendar and encouraged her to call me with any questions or concerns.  I look forward to seeing her soon!  Mike Craze, NP [place] [phone]

## 2015-09-15 DIAGNOSIS — L812 Freckles: Secondary | ICD-10-CM | POA: Diagnosis not present

## 2015-09-15 DIAGNOSIS — L570 Actinic keratosis: Secondary | ICD-10-CM | POA: Diagnosis not present

## 2015-09-15 DIAGNOSIS — Z85820 Personal history of malignant melanoma of skin: Secondary | ICD-10-CM | POA: Diagnosis not present

## 2015-09-15 DIAGNOSIS — L720 Epidermal cyst: Secondary | ICD-10-CM | POA: Diagnosis not present

## 2015-09-15 DIAGNOSIS — L821 Other seborrheic keratosis: Secondary | ICD-10-CM | POA: Diagnosis not present

## 2015-09-15 DIAGNOSIS — D1801 Hemangioma of skin and subcutaneous tissue: Secondary | ICD-10-CM | POA: Diagnosis not present

## 2015-10-04 DIAGNOSIS — E038 Other specified hypothyroidism: Secondary | ICD-10-CM | POA: Diagnosis not present

## 2015-10-04 DIAGNOSIS — E784 Other hyperlipidemia: Secondary | ICD-10-CM | POA: Diagnosis not present

## 2015-10-04 DIAGNOSIS — I10 Essential (primary) hypertension: Secondary | ICD-10-CM | POA: Diagnosis not present

## 2015-10-11 DIAGNOSIS — N390 Urinary tract infection, site not specified: Secondary | ICD-10-CM | POA: Diagnosis not present

## 2015-10-11 DIAGNOSIS — I10 Essential (primary) hypertension: Secondary | ICD-10-CM | POA: Diagnosis not present

## 2015-10-11 DIAGNOSIS — Z780 Asymptomatic menopausal state: Secondary | ICD-10-CM | POA: Diagnosis not present

## 2015-10-11 DIAGNOSIS — H9193 Unspecified hearing loss, bilateral: Secondary | ICD-10-CM | POA: Diagnosis not present

## 2015-10-11 DIAGNOSIS — E784 Other hyperlipidemia: Secondary | ICD-10-CM | POA: Diagnosis not present

## 2015-10-11 DIAGNOSIS — R8299 Other abnormal findings in urine: Secondary | ICD-10-CM | POA: Diagnosis not present

## 2015-10-11 DIAGNOSIS — E038 Other specified hypothyroidism: Secondary | ICD-10-CM | POA: Diagnosis not present

## 2015-10-11 DIAGNOSIS — C3490 Malignant neoplasm of unspecified part of unspecified bronchus or lung: Secondary | ICD-10-CM | POA: Diagnosis not present

## 2015-10-11 DIAGNOSIS — C50919 Malignant neoplasm of unspecified site of unspecified female breast: Secondary | ICD-10-CM | POA: Diagnosis not present

## 2015-10-11 DIAGNOSIS — R51 Headache: Secondary | ICD-10-CM | POA: Diagnosis not present

## 2015-10-11 DIAGNOSIS — D329 Benign neoplasm of meninges, unspecified: Secondary | ICD-10-CM | POA: Diagnosis not present

## 2015-10-11 DIAGNOSIS — Z1231 Encounter for screening mammogram for malignant neoplasm of breast: Secondary | ICD-10-CM | POA: Diagnosis not present

## 2015-10-11 DIAGNOSIS — Z0000 Encounter for general adult medical examination without abnormal findings: Secondary | ICD-10-CM | POA: Diagnosis not present

## 2015-11-02 DIAGNOSIS — R69 Illness, unspecified: Secondary | ICD-10-CM | POA: Diagnosis not present

## 2015-11-03 DIAGNOSIS — Z780 Asymptomatic menopausal state: Secondary | ICD-10-CM | POA: Diagnosis not present

## 2015-11-03 DIAGNOSIS — M859 Disorder of bone density and structure, unspecified: Secondary | ICD-10-CM | POA: Diagnosis not present

## 2015-11-04 ENCOUNTER — Ambulatory Visit (HOSPITAL_COMMUNITY): Payer: Medicare HMO

## 2015-11-10 ENCOUNTER — Ambulatory Visit: Payer: Medicare HMO | Admitting: Adult Health

## 2015-11-15 DIAGNOSIS — H02834 Dermatochalasis of left upper eyelid: Secondary | ICD-10-CM | POA: Diagnosis not present

## 2015-11-15 DIAGNOSIS — H02831 Dermatochalasis of right upper eyelid: Secondary | ICD-10-CM | POA: Diagnosis not present

## 2015-11-15 DIAGNOSIS — Z961 Presence of intraocular lens: Secondary | ICD-10-CM | POA: Diagnosis not present

## 2015-11-17 ENCOUNTER — Ambulatory Visit (HOSPITAL_BASED_OUTPATIENT_CLINIC_OR_DEPARTMENT_OTHER): Payer: Medicare HMO | Admitting: Adult Health

## 2015-11-17 ENCOUNTER — Encounter: Payer: Self-pay | Admitting: Adult Health

## 2015-11-17 VITALS — BP 155/53 | HR 65 | Temp 98.2°F | Resp 16 | Wt 133.5 lb

## 2015-11-17 DIAGNOSIS — Z853 Personal history of malignant neoplasm of breast: Secondary | ICD-10-CM

## 2015-11-17 DIAGNOSIS — Z85118 Personal history of other malignant neoplasm of bronchus and lung: Secondary | ICD-10-CM | POA: Diagnosis not present

## 2015-11-17 DIAGNOSIS — R0789 Other chest pain: Secondary | ICD-10-CM

## 2015-11-17 DIAGNOSIS — C50411 Malignant neoplasm of upper-outer quadrant of right female breast: Secondary | ICD-10-CM

## 2015-11-17 DIAGNOSIS — C3432 Malignant neoplasm of lower lobe, left bronchus or lung: Secondary | ICD-10-CM

## 2015-11-17 DIAGNOSIS — Z1211 Encounter for screening for malignant neoplasm of colon: Secondary | ICD-10-CM

## 2015-11-17 NOTE — Progress Notes (Signed)
CLINIC:  Cancer Survivorship   REASON FOR VISIT:  Routine follow-up for recent history of breast and lung cancers.  BRIEF ONCOLOGIC HISTORY:  Oncology History   Breast cancer (Right breast)   Staging form: Breast, AJCC 7th Edition     Pathologic stage from 10/17/2014: Stage IA (T1b, N0, cM0) - Signed by [name], MD on 10/17/2014 Lung cancer (LLL)   Staging form: Lung, AJCC 7th Edition     Clinical stage from 12/14/2014: Stage IA (T1a, N0, M0) - Unsigned       Breast cancer of upper-outer quadrant of right female breast ([place])   05/04/2014 Initial Diagnosis Breast cancer   05/19/2014 Imaging Diagnostic mammogram and ultrasound showed a 7 mm irregular suspicious mass located in the right breast at the 10:00 position, 7 cm from the nipple. Otherwise negative.   05/19/2014 Initial Biopsy Right breast biopsy (UOQ): IDC, grade 2.    05/19/2014 Receptors her2 ER 100% positive, PR 35% positive, HER-2 negative, Ki67 8%   06/07/2014 Miscellaneous Genetic testing normal.  APC, ATM, BARD1, BMPR1A, BRCA1, BRCA2, BRIP1, CDH1, CDK4, CDKN2A, CHEK2, EPCAM, GREM1, MLH1, MRE11A, MSH2, MSH6, MUTYH, NBN, NF1, PALB2, PMS2, POLD1, POLE, PTEN, RAD50, RAD51D, SMAD4, SMARCA4, STK11, and TP53 tested.    07/23/2014 Surgery Right breast lumpectomy with SLNB Lucia Gaskins) showed invasive ductal carcinoma & DCIS; negative surgical margins.   07/23/2014 Pathology Results Invasive ductal carcinoma, tumor size 0.8 cm, grade 1, no lymphovascular invasion, margins were negative, 2 sentinel lymph nodes were negative. (+) DCIS.   07/23/2014 Pathologic Stage pT1b, pN0: Stage IA    09/16/2014 - 10/07/2014 Radiation Therapy XRT completed Pablo Ledger). Right breast/ 42.72 Gy at 2.67 Gy per fraction x 21 fractions.    01/2015 -  Anti-estrogen oral therapy Aromasin prescribed and recommended.  Patient declines anti-estrogen therapy at this time d/t high co-pay of medication and possible side effects of AI therapy.    05/02/2015 Mammogram  Diagnostic TOMO bilat mammogram: No evidence of malignancy in either breast. Lumpectomy changes in right breast. Diagnostic mammo suggested in 1 year.     Primary cancer of left lower lobe of lung ([place])   10/25/2013 Imaging CXR done for stroke protocol. No acute chest findings. Chronic lung disease with biapical scarring. Developing nodule in (L) apex cannot be excluded based on exam, athough may be d/t overlap of osseous structures.    10/27/2013 Imaging CXR: Nodular opacity in mid (L) apex and appears more prominent than prior study. Area that appeared masslike in LUL near apex on recent CXR not seen at this time.  Underlying emphysema present. No edema or consolidation   11/12/2013 Imaging CT chest: At left lung apex, there is asymmetric pleural parenchymal scarring. No discrete lesion.  In superior segment of LLL, there are 1 dominant ground-glass opacity measuring 16 mm. 2 addt'l ground-glass opacities appreciated. F/U with CT in 3 months   02/11/2014 Imaging CT chest: Several small bilat faint nodular densities, largest measures 1.3 cm over superior segment LLL. No new nodules, no adenopathy. Cannot exclude metastatic disease given pt's h/o melanoma. Recommend PET-CT   02/23/2014 PET scan Persistent semi-solid nodule in superior segment LLL with low metabolic activity. Moderately metabolic subcarinal & hilar LN are likely reactive.    05/25/2014 Imaging CT chest: Persistent & similar size dominant LLL sub-solid nodule. Adenocarcinoma is considered. Biopsy or resection strongly considered. Additional scattered small ground-glass nodules stable.    06/02/2014 Imaging MRI brain: Grossly stable appearance of left planum sphenoidale meningioma measuring 20 x 14  mm. Meningioma does not extend to left orbital apex & contacts left optic nerve. Stable right posterior clinoid meningioma .    09/27/2014 Imaging CT chest: Further enlargment of central solid component in sub-solid LLL pulm nodule. Findings remain  concerning for adenoca. Additional scattered small bilat ground-glass nodules and biapical scarring stable. No adenopathy.     11/25/2014 Surgery Video bronchoscopy with endobronchial ultrasound; Video assisted thoracoscopy; Thoracoscopic LLL superior segmentectomy; Mediastinal lymph node dissection; Cyro intercostal nerve block Roxan Hockey)   11/25/2014 Pathology Results Superior segment lung resection (LLL). Adenocarcinoma, well-diff, spanning 1.5 cm. Surgical margins negative. 5 LNs negative.    11/25/2014 Pathologic Stage pT1a, pN0: Stage IA    11/25/2014 Initial Diagnosis Primary cancer of left lower lobe of lung ([place])   05/31/2015 Imaging CT chest: No evidence of local recurrent at superior segmentectomy in LLL. New small nodular focus of consolidation surrounding thin parenchymal band. Additional sub-cm ground-glass and solid nodules favored to be benign. No thoracic adenopathy   08/11/2015 Imaging CT chest: No evidence of residual or recurrent tumor in LLL. Stable small nodular focus in LLL with thin surrounding parenchymal band, favored to be benign.  Multiple small ground-glass nodules unchanged.     INTERVAL HISTORY:  Melissa Snyder presents to the [facility] today for our initial meeting to review her survivorship care plan detailing her treatment course for breast & lung cancers, as well as monitoring for long-term side effects of that treatment, education regarding health maintenance, screening, and overall wellness and health promotion.     Overall, Melissa Snyder reports feeling quite well since her last visit to the cancer center.  She is having some fatigue.  She is also periodically having some left-sided chest numbness/tingling that is worse when she wears a bra. She states, "My left breast just doesn't feel right. I don't feel a lump or anything, but it just doesn't feel right. The breast feels like a sandbag when I lay down at night."  She does not take any medication for this, but feels  like it may have gotten worse in the past few weeks.  There have been no precipitating events to make the pain worse that she is aware of.  Otherwise, she is largely without complaint.  Her appetite is good.  She is physically active with [employer] and completed the 12-week LiveStrong program through the [facility].  She was started on a once monthly pill for osteoporosis, but cannot recall the name.  She has only had 1 dose thus far and the medication was started by her PCP.    REVIEW OF SYSTEMS:  Review of Systems  Constitutional: Positive for malaise/fatigue.       (+) fatigue  Eyes:       Blind in (L) eye  Respiratory: Negative for cough and shortness of breath.   Cardiovascular: Negative for chest pain.  Gastrointestinal: Negative for nausea, vomiting, abdominal pain, diarrhea, constipation and blood in stool.  Genitourinary: Negative for dysuria and hematuria.  Skin: Negative for rash.  Neurological: Positive for tingling. Negative for dizziness and headaches.       -Numbness/tingling in and around left breast near lung cancer scars -"I've been unsteady on my feet sometimes since I went blind in my left eye"  Psychiatric/Behavioral: Negative for depression. The patient is not nervous/anxious.     ONCOLOGY TREATMENT TEAM (Breast):  -Surgeon:  Dr. Lucia Gaskins at [facility] Oncologist: Dr. Burr Medico -Radiation Oncologist: Dr. Pablo Ledger  ONCOLOGY TREATMENT TEAM (Lung):  -Cardiothoracic Surgeon:  Dr. Roxan Hockey   PAST MEDICAL/SURGICAL HISTORY:  Past Medical History  Diagnosis Date  . Chest pain   . Bundle branch block left   . Hypothyroid   . Hypertension   . Hyperlipidemia   . GERD (gastroesophageal reflux disease)   . Melanoma in situ of face ([place]) JUNE 2015    LEFT CHEEK  . Breast cancer ([place]) 05/2014    ER+/PR+/Her2-  . Family history of breast cancer   . Family history of bladder cancer   . Family history of prostate cancer   . Blind left eye 10/2013     cva  . Skin cancer   . Migraine     reports only having one  . Osteoarthritis   . Stroke [facility]) 2015    Blind in left eye as a result  . Lung nodule   . History of blood transfusion     age 20  . Meningioma ([place])     brain lining  . Radiation 09/16/14-10/07/14    Right  Breast  21 fractions   Past Surgical History  Procedure Laterality Date  . Dilation and curettage of uterus      X 2  . Bilateral salpingoophorectomy    . Bladder repair  2009    cystocele  . Melanoma excision Left 11/05/23, 11/30/13    CHEEK  . Bunionectomy      bilateral great toe  . Ct radiation therapy guide      Gamma radiation -lt frontal-[facility]  . Cardiac catheterization  2004  . Colonoscopy    . Appendectomy      age 45  . Abdominal hysterectomy  ~ 1997  . Cholecystectomy  ~ 1997  . Other surgical history      Gamma knife to Meningioma in brain lining  . Video bronchoscopy with endobronchial ultrasound N/A 11/25/2014    Procedure: VIDEO BRONCHOSCOPY WITH ENDOBRONCHIAL ULTRASOUND;  Surgeon: [name], MD;  Location: [place];  Service: Thoracic;  Laterality: N/A;  . Video assisted thoracoscopy Left 11/25/2014    Procedure: VIDEO ASSISTED THORACOSCOPY;  Surgeon: [name], MD;  Location: [place];  Service: Thoracic;  Laterality: Left;  . Segmentecomy Left 11/25/2014    Procedure: LEFT LOWER LOBE SEGMENTECTOMY;  Surgeon: [name], MD;  Location: [place];  Service: Thoracic;  Laterality: Left;  . Cryo intercostal nerve block Left 11/25/2014    Procedure: CRYO INTERCOSTAL NERVE BLOCK;  Surgeon: [name], MD;  Location: [place];  Service: Thoracic;  Laterality: Left;     ALLERGIES:  Allergies  Allergen Reactions  . Beef-Derived Products     ACID REFLUX  . Chocolate     Acid Reflux  . Hydrocodone Other (See Comments)    Couldn't sleep  . Other     FRIED FOODS  . Tramadol Itching  . Ibuprofen Rash and Other (See Comments)    Causes mouth sores  . Niacin And  Related Other (See Comments)    Flushing, burning, redness     CURRENT MEDICATIONS:  Current Outpatient Prescriptions on File Prior to Visit  Medication Sig Dispense Refill  . aspirin 81 MG chewable tablet Chew 1 tablet (81 mg total) by mouth daily. 30 tablet 0  . atenolol (TENORMIN) 50 MG tablet Take 50 mg by mouth at bedtime.     Marland Kitchen b complex vitamins capsule Take 1 capsule by mouth daily.    . calcium-vitamin D (OSCAL WITH D) 500-200 MG-UNIT per tablet Take 1 tablet by mouth  every morning.    . fish oil-omega-3 fatty acids 1000 MG capsule Take 1 g by mouth every morning.     Marland Kitchen levothyroxine (SYNTHROID, LEVOTHROID) 112 MCG tablet Take 112 mcg by mouth every morning.     Marland Kitchen lisinopril (PRINIVIL,ZESTRIL) 10 MG tablet Take 1 tablet (10 mg total) by mouth daily. (Patient taking differently: Take 10 mg by mouth 2 (two) times daily. ) 30 tablet 1  . Multiple Vitamin (MULTIVITAMIN) tablet Take 1 tablet by mouth every morning.     . Multiple Vitamins-Minerals (VISION FORMULA PO) Take 1 capsule by mouth daily.    . vitamin E (E-400) 400 UNIT capsule Take 400 Units by mouth 3 (three) times a week. Mon Wed Fri     No current facility-administered medications on file prior to visit.     ONCOLOGIC FAMILY HISTORY:  Family History  Problem Relation Age of Onset  . CAD Mother     CABG  . Hyperlipidemia Mother   . AAA (abdominal aortic aneurysm) Mother   . Renal Disease Father   . Kidney disease Father   . CAD Father   . Cancer Father     PROSTATE  . Diabetes Brother   . Hyperlipidemia Brother   . Breast cancer Daughter 27  . Breast cancer Sister 49  . Breast cancer Sister 1  . Cancer Brother     NOS  . Bladder Cancer Brother 22  . Breast cancer Cousin     five paternal first cousins  . Lung cancer Cousin     3 paternal cousins with lung cancer  . Cancer Cousin     4 paternal first cousins with Cancer NOS  . Cancer Cousin     1 paternal cousin with oral cancer (tongue)      GENETIC COUNSELING/TESTING: 06/07/14: Genetic testing normal.  APC, ATM, BARD1, BMPR1A, BRCA1, BRCA2, BRIP1, CDH1, CDK4, CDKN2A, CHEK2, EPCAM, GREM1, MLH1, MRE11A, MSH2, MSH6, MUTYH, NBN, NF1, PALB2, PMS2, POLD1, POLE, PTEN, RAD50, RAD51D, SMAD4, SMARCA4, STK11, and TP53 tested.   SOCIAL HISTORY:  Melissa Snyder is married and lives with her husband in [place], [place].  She has 4 children from a previous relationship.  She has 7 grandchildren and 3 great-grandchildren.  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  She is originally from [place], [place].  In her free time, she enjoys tending to her yard/garden and reading.  She denies any current or history of tobacco or alcohol use.      PHYSICAL EXAMINATION:  Vital Signs:   Filed Vitals:   11/17/15 1334  BP: 155/53  Pulse: 65  Temp: 98.2 F (36.8 C)  Resp: 16   General: Well-nourished, well-appearing female in no acute distress.  Unaccompanied today. HEENT: Head is normocephalic.  Pupils equal and reactive to light and accomodation. Conjunctivae clear without exudate.  Sclerae anicteric.  Lymph: No cervical, supraclavicular, infraclavicular, or axillary lymphadenopathy noted on palpation.  Cardiovascular: Regular rate and rhythm. Respiratory: Diminished breath sounds to LLL, otherwise clear to auscultation bilaterally. Chest expansion symmetric without accessory muscle use.  Breathing non-labored. Focal tenderness to palpation superior to healed posterior chest wall scar.  Breast exam:  -Left breast: No palpable nodularity or masses. Fibrocystic changes appreciated.  Mild tenderness to palpation, particularly in upper outer quadrant at 2-3 o'clock. No mass appreciated in area of tenderness/concern. -Right breast: No palpable nodularity or masses. Radiation changes with mild fibrosis to breast  tissue as expected.   -Axilla: No palpable axillary  adenopathy bilaterally.  GI: Abdomen soft and round. Non-tender, non-distended. Bowel sounds normoactive. No hepatosplenomegaly.   GU: Deferred.  Neuro: No focal deficits. Steady gait.  Psych: Mood and affect normal and appropriate for situation.  Extremities: No edema. Skin: Warm and dry. No open lesions noted.   LABORATORY DATA:  None for this visit.  DIAGNOSTIC IMAGING:  None for this visit.     ASSESSMENT AND PLAN:  Melissa Snyder is a pleasant 82 y.o. female with history of Stage IA invasive ductal carcinoma of her right breast, ER+/PR+/HER2 neg, diagnosed in 05/2014; treated with lumpectomy with SLNB and adjuvant radiation; pt declined adjuvant anti-estrogen therapy; completed treatment on 10/07/14.  She also has a history of Stage IA non-small cell lung cancer of the left lower lobe, diagnosed and treated with superior segment lung resection of LLL on 11/25/14.   1. History of Stage IA breast cancer:  Clinically, she is without evidence of disease on physical exam today.  Her last mammogram was in 04/2015 and was normal.  Melissa Snyder will return to the Survivorship Clinic in 6 month's time with history and physical exam.  Her mammogram is due in 04/2016 and I have placed orders for bilateral diagnostic mammogram today.  She received a copy of her Survivorship Care Plan today and it was reviewed with her in detail. A copy of this summary, along with a letter will be sent to the patient's primary care provider via mail/fax/In Basket message after today's visit.  Melissa Snyder is welcome to return to the Survivorship Clinic in the future, as needed; no follow-up will be scheduled at this time.    2. History of Stage IA lung cancer:  Clinically, she is without evidence of disease. Her last CT chest was in 08/2015 and demonstrated no evidence of residual or recurrent tumor in LLL.  She will follow-up with Dr. Roxan Hockey in 02/2016 with CT chest without contrast.  I did not see orders for imaging and placed  those orders today.  I will share my note with Dr. Roxan Hockey to make him aware.     3. Intermittent left chest wall pain: She does feel like it may have gotten worse in the past few weeks.  We discussed a trial of gabapentin to help with what appears to be some surgically associated neuropathic pain.  There are no palpable abnormalities in her left breast.  She is not interested in adding any additional medications right now, which is reasonable.  I encouraged her to call me if she has further questions or concerns and we could revisit possible strategies to manage her pain, if needed.   4. Bone health:  Given Melissa Snyder's age/history of breast cancer, she is at risk for bone demineralization.  Her last DEXA scan was 08/25/13 revealing osteopenia.  Her PCP is managing her decreased bone density and I defer to his recommendations/management given the patient has declined anti-estrogen therapy for her breast cancer which would put her at increased risk of bone thinning.  She was encouraged to increase her consumption of foods rich in calcium, as well as increase her weight-bearing activities.  She was given education on specific activities to promote bone health.  5. Cancer screening:  Due to Melissa Snyder's history and her age, she should receive screening for skin cancers (particularly given her history of melanoma), colon cancer, and gynecologic cancers.  Although not urgent, as she has no concerning GI complaints, she  would like to get her colonoscopy scheduled. Therefore, I have placed a referral to [facility] for consideration of screening colonoscopy. The information and recommendations for cancer screenings are listed on the patient's comprehensive care plan/treatment summary and were reviewed in detail with her today.   6. Health maintenance and wellness promotion: Melissa Snyder was encouraged to consume 5-7 servings of fruits and vegetables per day. We reviewed the "Nutrition Rainbow" handout, as well as the  handout about "Nutrition for Breast Cancer Survivors."  She was also encouraged to engage in moderate to vigorous exercise for 30 minutes per day most days of the week. She has already completed the LiveStrong program through the [facility] in the past and found it very helpful.  She was instructed to limit her alcohol consumption and continue to abstain from tobacco use.   7. Support services/counseling: It is not uncommon for this period of the patient's cancer care trajectory to be one of many emotions and stressors.   Melissa Snyder was encouraged to take advantage of our many other support services programs, support groups, and/or counseling in coping with her new life as a cancer survivor after completing anti-cancer treatment.  She was offered support today through active listening and expressive supportive counseling.  She was given information regarding our available services and encouraged to contact me with any questions or for help enrolling in any of our support group/programs.    A total of 55 minutes of face-to-face time was spent with this patient with greater than 50% of that time in counseling and care-coordination.   Mike Craze, NP Survivorship Program [place] [phone]   Note: PRIMARY CARE PROVIDER Jerlyn Ly, [place] [phone]

## 2015-11-17 NOTE — Progress Notes (Unsigned)
Lung Cancer Treatment Summary & Survivorship Care Plan Provided by Mike Craze, NP on 11/07/2015   General Information  Patient Name Melissa Snyder   Patient ID [id number]   Date of Birth Mar 10, 1934     Your Care Team  Patient Care Team: [name], MD as PCP - General (Internal Medicine) [name], MD - Cardiothoracic Surgery [name], MD - Medical Oncologist  [name], MD - Radiation Oncologist Norton Blizzard, RN - Thoracic Nurse Navigator Mike Craze, NP - Survivorship Nurse Practitioner   To reach your [facility] providers, call [phone].   Cancer Diagnosis Information  Diagnosis Non-Small Cell Lung Cancer  Tumor Location Left lower lobe  Diagnosis Date 11/25/14  Pathology Adenocarcinoma  Staging  Primary cancer of left lower lobe of lung ([place])   Staging form: Lung, AJCC 7th Edition     Clinical stage: Stage IA (T1a, N0, M0)  Family History Family History  Problem Relation Age of Onset  . CAD Mother     CABG  . Hyperlipidemia Mother   . AAA (abdominal aortic aneurysm) Mother   . Renal Disease Father   . Kidney disease Father   . CAD Father   . Cancer Father     PROSTATE  . Diabetes Brother   . Hyperlipidemia Brother   . Breast cancer Daughter 64  . Breast cancer Sister 66  . Breast cancer Sister 31  . Cancer Brother     NOS  . Bladder Cancer Brother 67  . Breast cancer Cousin     five paternal first cousins  . Lung cancer Cousin     3 paternal cousins with lung cancer  . Cancer Cousin     4 paternal first cousins with Cancer NOS  . Cancer Cousin     1 paternal cousin with oral cancer (tongue)    Smoking Status Never smoker    Treatment Summary  Oncology History   Breast cancer (Right breast)   Staging form: Breast, AJCC 7th Edition     Pathologic stage from 10/17/2014: Stage IA (T1b, N0, cM0) - Signed by [name], MD on 10/17/2014 Lung cancer (LLL)   Staging form: Lung, AJCC 7th Edition     Clinical  stage from 12/14/2014: Stage IA (T1a, N0, M0) - Unsigned         Primary cancer of left lower lobe of lung ([place])   10/25/2013 Imaging CXR done for stroke protocol. No acute chest findings. Chronic lung disease with biapical scarring. Developing nodule in (L) apex cannot be excluded based on exam, athough may be d/t overlap of osseous structures.    10/27/2013 Imaging CXR: Nodular opacity in mid (L) apex and appears more prominent than prior study. Area that appeared masslike in LUL near apex on recent CXR not seen at this time.  Underlying emphysema present. No edema or consolidation   11/12/2013 Imaging CT chest: At left lung apex, there is asymmetric pleural parenchymal scarring. No discrete lesion.  In superior segment of LLL, there are 1 dominant ground-glass opacity measuring 16 mm. 2 addt'l ground-glass opacities appreciated. F/U with CT in 3 months   02/11/2014 Imaging CT chest: Several small bilat faint nodular densities, largest measures 1.3 cm over superior segment LLL. No new nodules, no adenopathy. Cannot exclude metastatic disease given pt's h/o melanoma. Recommend PET-CT   02/23/2014 PET scan Persistent semi-solid nodule in superior segment LLL with low metabolic activity. Moderately metabolic subcarinal & hilar LN are likely reactive.  05/25/2014 Imaging CT chest: Persistent & similar size dominant LLL sub-solid nodule. Adenocarcinoma is considered. Biopsy or resection strongly considered. Additional scattered small ground-glass nodules stable.    06/02/2014 Imaging MRI brain: Grossly stable appearance of left planum sphenoidale meningioma measuring 20 x 14 mm. Meningioma does not extend to left orbital apex & contacts left optic nerve. Stable right posterior clinoid meningioma .    09/27/2014 Imaging CT chest: Further enlargment of central solid component in sub-solid LLL pulm nodule. Findings remain concerning for adenoca. Additional scattered small bilat ground-glass nodules and biapical  scarring stable. No adenopathy.     11/25/2014 Surgery Video bronchoscopy with endobronchial ultrasound; Video assisted thoracoscopy; Thoracoscopic LLL superior segmentectomy; Mediastinal lymph node dissection; Cyro intercostal nerve block Roxan Hockey)   11/25/2014 Pathology Results Superior segment lung resection (LLL). Adenocarcinoma, well-diff, spanning 1.5 cm. Surgical margins negative. 5 LNs negative.    11/25/2014 Pathologic Stage pT1a, pN0: Stage IA    11/25/2014 Initial Diagnosis Primary cancer of left lower lobe of lung ([place])   05/31/2015 Imaging CT chest: No evidence of local recurrent at superior segmentectomy in LLL. New small nodular focus of consolidation surrounding thin parenchymal band. Additional sub-cm ground-glass and solid nodules favored to be benign. No thoracic adenopathy   08/11/2015 Imaging CT chest: No evidence of residual or recurrent tumor in LLL. Stable small nodular focus in LLL with thin surrounding parenchymal band, favored to be benign.  Multiple small ground-glass nodules unchanged.       Treatment Goal Curative              Your cancer treatment for lung cancer included surgery alone.              Surgery: Type of Surgery Left lower lobe lung segmentectomy with mediastinal lymph node dissection   Date of Surgery 11/25/14  Number of Lymph Nodes Removed 5  Number of Positive Lymph Nodes 0  Positive Surgical Margins?   No; surgical margins negative.    Side Effects of Surgery  Possible Long-Term:  Scars  Chronic pain  Possible Late-Term (5 or more years after treatment): Marland Kitchen Lymphedema or Swelling of the arms, neck, and/or chest region                Survivorship Care & Follow-up Schedule   Becoming a cancer survivor is a time of great celebration, but also a time of many questions and concerns.  The following information and resources are for you, your caregivers, and your primary care doctor to better understand the needs of a  cancer survivor.     How often will I see my cancer providers: Survivor Years 0-2  (Survivor Years defined by length of time since cancer diagnosis)  When?  Responsible Provider  Cardiothoracic Surgeon visits Every 6 months Dr. Roxan Hockey  Radiation Oncology visits Every 6 months initially  Dr. Pablo Ledger  CT scan of chest Every 6 months Dr. Pablo Ledger or Dr. Roxan Hockey  Survivorship Nurse Practitioner (NP) After treatment and as directed by Dr. Barry Brunner, NP      How often will I see my cancer providers: Survivor Years 2-5+ (Survivor Years defined by length of time since cancer diagnosis)  When?  Responsible Provider  Cardiothoracic Surgeon visits Annually, until he decides to discharge you to survivorship Dr. Roxan Hockey  Survivorship Nurse Practitioner (NP) Annually Mike Craze, NP  CT scan of chest  Annually Mike Craze, NP  Screening Recommendations Get regular screenings, as indicated by your Primary Care Provider   Frequency Responsible Provider  Annual Physical By your Primary Care Provider Should include skin examination Annually Discuss with Dr. [name]  Colonoscopy Beginning at age 89 unless clinically indicated to begin sooner Every 51 Years Discuss with Dr. [name]  Pap Smear (Women) Frequency to be discussed and determined by Primary Care Provider or Gynecology Specialist Discuss with Dr. [name] Discuss with Dr. [name] or Gynecology Specialist  Vaccinations (Flu, Shingles, Pneumonia, TDaP, etc.) Discuss with Dr. [name] Discuss with Dr. [name]      Common Complaints/Concerns of Cancer Survivors  *Patients often worry if what they are experiencing is normal, or to be expected, given their cancer history and treatment.  Below are common complaints & concerns reported by cancer survivors. If you are concerned about symptoms you are experiencing, please report all symptoms to your cancer  care team!   Common Complaints/Concerns for Lung Cancer Survivors   Fatigue  Dry cough  Memory problems and/or confusion  Depression  Anxiety  Weight changes  Insomnia or trouble sleeping  Decreased range of motion (difficulty moving neck)  Intimacy and sexuality  Marital/partner/family relationships   Employment, health insurance concerns, and/or finances  Concerns regarding spirituality  Exercise after cancer treatments  Maintaining a tobacco-free lifestyle           Symptoms to Watch For and Report to Your Provider   *Often cancer survivors are fearful about their cancer coming back or being diagnosed with a new cancer.  Below are symptoms that you and your loved ones should watch for and report to your provider.   General Symptoms to Watch for and Report to Your Provider .  Return of the cancer symptoms you had before- such as a lump or new growth where your cancer first started . New or unusual pain that seems unrelated to an injury and does not go away, including back pain or bone pain . Weight loss without trying/intending . Unexplained bleeding . A rash or allergic reaction, such as swelling, severe itching or wheezing . Chills or fevers . Persistent headaches . Shortness of breath or difficulty breathing . Bloody stools or blood in your urine . Lumps, bumps, swelling and/or nipple discharge . Nausea, vomiting, diarrhea, loss of appetite, or trouble swallowing . A cough that doesn't go away . Abdominal pain . Swelling in your arms or legs . Fractures . Any other signs mentioned by your doctor or nurse or any unusual symptoms                 that you just can't explain   NOTE: Just because you have certain symptoms, it doesn't mean the cancer has come back or you have a new cancer. Symptoms can be due to other problems that need to be addressed.  It is important to watch for these symptoms and report them to your provider so you can be medically  evaluated for any of these concerns!                     What Now?  Thriving & Surviving In Your Life After Cancer   Stop smoking or continue to abstain from tobacco use. If you need help with smoking cessation, talk to your healthcare team about different options to help you!  Consider participating in "Finding Your New Normal" [facility]) survivorship series or other support groups through our Patient & Family Support  Center.   [employer] fitness program for cancer survivors.   Consider FREE counseling at cancer center.  For more information, contact Ottis Stain at [phone].  Keep your follow-up appointments with all of your specialists (Cancer doctors, Pulmonary doctors, Primary Care Provider, Dietician, Physical Therapist, etc.)  Limit alcohol consumption or abstain from consuming alcohol all together.   Maintain adequate nutrition with plenty of fresh fruits & vegetables.       Thank you so much for allowing [facility] to care for you during your cancer experience.  Our continued commitment is to you and your caregiver(s) health and happiness and again, Congratulations!     Mike Craze, NP Survivorship Program [facility] [phone]                 ~For additional information, see attached resources.          Additional Resources for Lung Cancer Survivors  Survivors of cancer often report some of the following needs or concerns after they have completed treatment.  Below are some suggested interventions and resources to help guide you.  Just because your cancer treatment has ended, it does not mean that we stop helping you manage your needs or concerns.  Please let us know how we can best help you in your new life post-cancer and your return to health and wellness!       Common Needs/Concerns Suggested intervention(s)  Fatigue . This is the most common symptom experienced by lung cancer  survivors. You are not alone!  . Regular physical activity - walking 20 minutes daily . Evaluation for hypothyroidism, anemia, sleep disturbance, or depression . For more information, visit the National Cancer Institute's (NCI) website: http://www.kaiser.com/   Shortness of breath . Very common after lung cancer surgery due to reduced lung volume, as well as decreased physical activity. . Referral to Physical Therapy for improved endurance and stamina.  Maish Vaya booklet   Chronic cough or changes in your voice . The cough could be related to other airway diseases, like asthma or COPD, and those conditions should be treated to help with the cough.  . Could be related to acid reflux from the stomach.  You may need a medication to help with the acid which will help the cough.  . Over-the-counter cough suppressants can offer some relief.   . Sometimes, a short course of oral steroids can help suppress a cough.  Ask your doctor which treatment option may be best for you.   Chronic pain . About 50% of lung cancer survivors will experience some type of chronic pain, particularly after thoracotomy surgery.  . Communicate honestly with your doctors and providers about your pain so that we may help make the best recommendations for you.   Smoking cessation and/or maintaining your tobacco-free lifestyle . Referral for Smoking Cessation Counseling . Support Groups and Counseling . Medications to help with nicotine withdrawal symptoms. . Consider acupuncture or hypnosis.  . For more information and valuable resources, visit: smokefree.gov   Memory problems and/or confusion . About 25% of cancer patients have some degree of cognitive dysfunction (meaning memory problems, trouble concentrating, trouble finding the right word, etc.) after treatment.  This usually gets better over time.  . Some patients may need evaluation for sleep disturbances  contributing to memory problems or depression.  . For more information, visit NCI's website at: ForwardDrop.tn   Change in hearing . Certain chemotherapy drugs can cause hearing loss.  Most patients improve with time.  Marland Kitchen Referral to Audiologist for hearing evaluation and recommendations.   Peripheral neuropathy . Some chemotherapy drugs can cause numbness/tingling in the hands and feet.  This usually improves with time and when chemotherapy is completed.  However, some patients may experience symptoms long after their chemotherapy has finished.  . Gabapentin, a prescription medication, may help.  . Certain antidepressant drugs have been shown to help with neuropathy pain.   . Over-the-counter capsaicin cream may help temporarily decrease the sensation of pain.   . Very rarely are strong pain medications needed for treatment of neuropathy.  . Visit NCI's website for more information: http://www.taylor.net/   Depression/General Anxiety  Consider counseling and/or initiation of pharmacotherapy  Referral to Social Worker   Referral to [place]. Call [phone] for details.  Finding Your New Normal [facility]) class through the [place].  Call [phone] for details.  Other resources: Nurse, learning disability.org, Call the [place] at [phone] for additional resources.  Insomnia or trouble sleeping  Practice good sleep habits  Discuss treatments of hot flashes  Consider treatment for anxiety  Read this from the [place] http://www.cancer.org/treatment/treatmentsandsideeffects/physicalsideeffects/fatigue/fatigueinpeoplewithcancer/fatigue-in-people-with-cancer-treating-fatigue  Weight loss/Loss of appetite  Dietary counseling with a Registered Dietician  Attend a healthy cooking class at [facility]. Call [phone] for details.  If your  weight loss has been significant (greater than 15 pounds in 6 months or less), then call your doctor.  You need to be seen and evaluated.   Appetite stimulant medications can help improve appetite and help you gain weight.   Eat high-calorie foods as often as you can tolerate.   Weight gain or overweight  ( BMI over 25.0 m  or weight gain of >15 lbs)   Dietary counseling with a Registered Dietician  Attend a healthy cooking class at [facility]. Call [phone] for details.  Referral to a weight management support group (e.g. Weight Watchers, Overeaters Anonymous)  Change in your eating habits, your taste, or your smell . Choose foods with tart flavors like lemon wedges, lemonade, citrus fruits, vinegar, and pickled foods.  . Add sweeteners or a little bit of sugar to foods. A little sweetness can help increase pleasant tastes. . Season foods with herbs/spices/or other seasonings like onion, garlic, chili powder, barbecue sauce, mustard, ketchup, mint, etc.  . If meats taste strange, marinate or cook meats in sweet juices, fruits, acidic dressings, or wine.  . If certain foods or drinks smell unpleasant while cooking, choose foods that do not need to be cooked, like cold sandwiches, crackers and cheese, yogurt and fruit, or cold cereal.  . Keep your mouth clean and healthy by rinsing and brushing your teeth after meals and before bed.   Decreased range of motion   Referral to Physical Therapy  Consider taking yoga or Tai Chi classes at the [facility].  Call [phone] for a schedule.  Intimacy and sexuality  Evaluation and treatment for anxiety, depression, as appropriate  Address body image concerns  Attend the American Cancer Society's Look Good.Feel Better program. Call [phone] for a schedule of when this event is offered.   Try a vaginal lubricant (Astroglide) or moisturizer (Replens- 1x every 3 days)  Consider attending support groups at [facility]. Call [phone] for  details.  Attend the Finding Your New Normal [facility]) class at [facility]. Call [phone] for details.  Marital/partner/family relationships  Referral to Forensic psychologist Groups at the [employer]  New Normal [facility]) class through the [place]. Call [phone] for details.  Stigma of lung cancer diagnosis   Do not blame yourself for having cancer!  If you are experiencing anxiety or depression as a result of what others think about your diagnosis, then let us help you!  Referral to Education officer, museum or Counselor  Referral to [employer], health insurance, and/or finances  Referral to Education officer, museum  Concerns regarding spirituality, faith, coping, relating to God, loss of faith, facing my mortality, and/or loss of my sense of purpose  Referral to Chaplain   Referral to [employer] activities: www.hirschwellnessnetwork.org  Support Groups at the [facility]. Call [phone] for details.  Finding Your New Normal [facility]) class through [facility].  Call [phone] for details.  Returning to an exercise program  Consider joining the [place], a 12-week program that meets twice a week for 90 minutes using traditional exercise methods to ease you back into fitness and help you maintain a healthy weight.   This program is FREE for you and a friend.  Offered at several local [place].    Contact Elaina Hoops at [phone] or [email or web address]  Consider joining [place] or Yoga at [facility]. Call [phone] for a schedule.

## 2015-11-17 NOTE — Patient Instructions (Signed)
Thank you for coming in to see me today!  I will place the referral to the stomach doctors for your colonoscopy.  I will order your mammogram for sometime in November.  You will see Dr. Roxan Hockey sometime in September and I will place the orders for your CT scan of your chest that he will review with you.    Please feel free to call me with any questions or concerns!   Mike Craze, NP [place] [phone]

## 2015-12-19 ENCOUNTER — Encounter (HOSPITAL_COMMUNITY): Payer: Self-pay | Admitting: Family Medicine

## 2015-12-19 ENCOUNTER — Ambulatory Visit (HOSPITAL_COMMUNITY)
Admission: EM | Admit: 2015-12-19 | Discharge: 2015-12-19 | Disposition: A | Discharge status: Home / Self Care | Payer: Medicare HMO | Attending: Family Medicine | Admitting: Family Medicine

## 2015-12-19 DIAGNOSIS — Z7982 Long term (current) use of aspirin: Secondary | ICD-10-CM | POA: Diagnosis not present

## 2015-12-19 DIAGNOSIS — N390 Urinary tract infection, site not specified: Secondary | ICD-10-CM

## 2015-12-19 DIAGNOSIS — Z9049 Acquired absence of other specified parts of digestive tract: Secondary | ICD-10-CM | POA: Diagnosis not present

## 2015-12-19 DIAGNOSIS — Z90710 Acquired absence of both cervix and uterus: Secondary | ICD-10-CM | POA: Diagnosis not present

## 2015-12-19 DIAGNOSIS — Z79899 Other long term (current) drug therapy: Secondary | ICD-10-CM | POA: Diagnosis not present

## 2015-12-19 DIAGNOSIS — Z9889 Other specified postprocedural states: Secondary | ICD-10-CM | POA: Diagnosis not present

## 2015-12-19 DIAGNOSIS — R350 Frequency of micturition: Secondary | ICD-10-CM | POA: Diagnosis present

## 2015-12-19 LAB — POCT URINALYSIS DIP (DEVICE)
BILIRUBIN URINE: NEGATIVE
Glucose, UA: NEGATIVE mg/dL
Ketones, ur: NEGATIVE mg/dL
NITRITE: NEGATIVE
PH: 5.5 (ref 5.0–8.0)
Protein, ur: NEGATIVE mg/dL
Specific Gravity, Urine: 1.01 (ref 1.005–1.030)
UROBILINOGEN UA: 0.2 mg/dL (ref 0.0–1.0)

## 2015-12-19 MED ORDER — CEPHALEXIN 250 MG PO CAPS: 250.0000 mg | ORAL_CAPSULE | Freq: Four times a day (QID) | ORAL | Status: DC

## 2015-12-19 NOTE — ED Notes (Signed)
Pt here for urinary frequency and pressure in lower abdomen. Denies burning or hematuria.

## 2015-12-19 NOTE — Discharge Instructions (Signed)

## 2015-12-19 NOTE — ED Provider Notes (Signed)
CSN: [id number]     Arrival date & time 12/19/15  1758 History   First MD Initiated Contact with Patient 12/19/15 1822     Chief Complaint  Patient presents with  . Urinary Frequency   (Consider location/radiation/quality/duration/timing/severity/associated sxs/prior Treatment) HPI History obtained from patient:  Pt presents with the cc of:  Dysuria Duration of symptoms: 2 days Treatment prior to arrival: None Context: Sudden onset of dysuria urgency and frequency. No blood in urine. Other symptoms include: Sensation that she has not completely emptied her bladder. Pain score: 1 or 2 FAMILY HISTORY: Hypertension    Past Medical History  Diagnosis Date  . Chest pain   . Bundle branch block left   . Hypothyroid   . Hypertension   . Hyperlipidemia   . GERD (gastroesophageal reflux disease)   . Melanoma in situ of face ([place]) JUNE 2015    LEFT CHEEK  . Breast cancer ([place]) 05/2014    ER+/PR+/Her2-  . Family history of breast cancer   . Family history of bladder cancer   . Family history of prostate cancer   . Blind left eye 10/2013    cva  . Skin cancer   . Migraine     reports only having one  . Osteoarthritis   . Stroke [facility]) 2015    Blind in left eye as a result  . Lung nodule   . History of blood transfusion     age 82  . Meningioma ([place])     brain lining  . Radiation 09/16/14-10/07/14    Right  Breast  21 fractions   Past Surgical History  Procedure Laterality Date  . Dilation and curettage of uterus      X 2  . Bilateral salpingoophorectomy    . Bladder repair  2009    cystocele  . Melanoma excision Left 11/05/23, 11/30/13    CHEEK  . Bunionectomy      bilateral great toe  . Ct radiation therapy guide      Gamma radiation -lt frontal-[facility]  . Cardiac catheterization  2004  . Colonoscopy    . Appendectomy      age 55  . Abdominal hysterectomy  ~ 1997  . Cholecystectomy  ~ 1997  . Other surgical history      Gamma knife to Meningioma in brain lining  .  Video bronchoscopy with endobronchial ultrasound N/A 11/25/2014    Procedure: VIDEO BRONCHOSCOPY WITH ENDOBRONCHIAL ULTRASOUND;  Surgeon: [name], MD;  Location: [place];  Service: Thoracic;  Laterality: N/A;  . Video assisted thoracoscopy Left 11/25/2014    Procedure: VIDEO ASSISTED THORACOSCOPY;  Surgeon: [name], MD;  Location: [place];  Service: Thoracic;  Laterality: Left;  . Segmentecomy Left 11/25/2014    Procedure: LEFT LOWER LOBE SEGMENTECTOMY;  Surgeon: [name], MD;  Location: [place];  Service: Thoracic;  Laterality: Left;  . Cryo intercostal nerve block Left 11/25/2014    Procedure: CRYO INTERCOSTAL NERVE BLOCK;  Surgeon: [name], MD;  Location: [facility];  Service: Thoracic;  Laterality: Left;   Family History  Problem Relation Age of Onset  . CAD Mother     CABG  . Hyperlipidemia Mother   . AAA (abdominal aortic aneurysm) Mother   . Renal Disease Father   . Kidney disease Father   . CAD Father   . Cancer Father     PROSTATE  . Diabetes Brother   . Hyperlipidemia Brother   . Breast cancer Daughter  5  . Breast cancer Sister 65  . Breast cancer Sister 85  . Cancer Brother     NOS  . Bladder Cancer Brother 64  . Breast cancer Cousin     five paternal first cousins  . Lung cancer Cousin     3 paternal cousins with lung cancer  . Cancer Cousin     4 paternal first cousins with Cancer NOS  . Cancer Cousin     1 paternal cousin with oral cancer (tongue)   Social History  Substance Use Topics  . Smoking status: Never Smoker   . Smokeless tobacco: Never Used  . Alcohol Use: No   OB History    No data available     Review of Systems  Denies: HEADACHE, NAUSEA, ABDOMINAL PAIN, CHEST PAIN, CONGESTION,  , SHORTNESS OF BREATH   Allergies  Beef-derived products; Chocolate; Hydrocodone; Other; Tramadol; Ibuprofen; and Niacin and related  Home Medications   Prior to Admission medications   Medication Sig Start Date End Date  Taking? Authorizing Provider  aspirin 81 MG chewable tablet Chew 1 tablet (81 mg total) by mouth daily. 10/26/13   Thurnell Lose, MD  atenolol (TENORMIN) 50 MG tablet Take 50 mg by mouth at bedtime.     Historical Provider, MD  b complex vitamins capsule Take 1 capsule by mouth daily.    Historical Provider, MD  calcium-vitamin D (OSCAL WITH D) 500-200 MG-UNIT per tablet Take 1 tablet by mouth every morning.    Historical Provider, MD  cephALEXin (KEFLEX) 250 MG capsule Take 1 capsule (250 mg total) by mouth 4 (four) times daily. 12/19/15   Konrad Felix, PA  fish oil-omega-3 fatty acids 1000 MG capsule Take 1 g by mouth every morning.     Historical Provider, MD  levothyroxine (SYNTHROID, LEVOTHROID) 112 MCG tablet Take 112 mcg by mouth every morning.     Historical Provider, MD  lisinopril (PRINIVIL,ZESTRIL) 10 MG tablet Take 1 tablet (10 mg total) by mouth daily. Patient taking differently: Take 10 mg by mouth 2 (two) times daily.  11/29/14   John Giovanni, PA-C  Multiple Vitamin (MULTIVITAMIN) tablet Take 1 tablet by mouth every morning.     Historical Provider, MD  Multiple Vitamins-Minerals (VISION FORMULA PO) Take 1 capsule by mouth daily.    Historical Provider, MD  vitamin E (E-400) 400 UNIT capsule Take 400 Units by mouth 3 (three) times a week. Mon Wed Fri    Historical Provider, MD   Meds Ordered and Administered this Visit  Medications - No data to display  BP 189/70 mmHg  Pulse 71  Temp(Src) 98.1 F (36.7 C) (Oral)  Resp 18  SpO2 100% No data found.   Physical Exam NURSES NOTES AND VITAL SIGNS REVIEWED. CONSTITUTIONAL: Well developed, well nourished, no acute distress HEENT: normocephalic, atraumatic EYES: Conjunctiva normal NECK:normal ROM, supple, no adenopathy PULMONARY:No respiratory distress, normal effort ABDOMINAL: Soft, ND, NT BS+, No CVAT MUSCULOSKELETAL: Normal ROM of all extremities,  SKIN: warm and dry without rash PSYCHIATRIC: Mood and affect, behavior  are normal  ED Course  Procedures (including critical care time)  Labs Review Labs Reviewed  POCT URINALYSIS DIP (DEVICE) - Abnormal; Notable for the following:    Hgb urine dipstick SMALL (*)    Leukocytes, UA TRACE (*)    All other components within normal limits  URINE CULTURE   Urine culture pending Imaging Review No results found.   Visual Acuity Review  Right Eye Distance:  Left Eye Distance:   Bilateral Distance:    Right Eye Near:   Left Eye Near:    Bilateral Near:         MDM   1. UTI (lower urinary tract infection)     Patient is reassured that there are no issues that require transfer to higher level of care at this time or additional tests. Patient is advised to continue home symptomatic treatment. Patient is advised that if there are new or worsening symptoms to attend the emergency department, contact primary care provider, or return to UC. Instructions of care provided discharged home in stable condition.    THIS NOTE WAS GENERATED USING A VOICE RECOGNITION SOFTWARE PROGRAM. ALL REASONABLE EFFORTS  WERE MADE TO PROOFREAD THIS DOCUMENT FOR ACCURACY.  I have verbally reviewed the discharge instructions with the patient. A printed AVS was given to the patient.  All questions were answered prior to discharge.      Konrad Felix, [place] 12/19/15 [phone]

## 2015-12-21 LAB — URINE CULTURE: Special Requests: NORMAL

## 2015-12-22 DIAGNOSIS — Z1211 Encounter for screening for malignant neoplasm of colon: Secondary | ICD-10-CM | POA: Diagnosis not present

## 2016-01-19 DIAGNOSIS — K219 Gastro-esophageal reflux disease without esophagitis: Secondary | ICD-10-CM | POA: Diagnosis not present

## 2016-01-19 DIAGNOSIS — E039 Hypothyroidism, unspecified: Secondary | ICD-10-CM | POA: Diagnosis not present

## 2016-01-19 DIAGNOSIS — I10 Essential (primary) hypertension: Secondary | ICD-10-CM | POA: Diagnosis not present

## 2016-01-19 DIAGNOSIS — Z0000 Encounter for general adult medical examination without abnormal findings: Secondary | ICD-10-CM | POA: Diagnosis not present

## 2016-01-19 DIAGNOSIS — M810 Age-related osteoporosis without current pathological fracture: Secondary | ICD-10-CM | POA: Diagnosis not present

## 2016-01-23 NOTE — Telephone Encounter (Signed)
error 

## 2016-02-07 ENCOUNTER — Ambulatory Visit
Admission: RE | Admit: 2016-02-07 | Discharge: 2016-02-07 | Disposition: A | Discharge status: Home / Self Care | Payer: Medicare HMO | Source: Ambulatory Visit | Attending: Adult Health | Admitting: Adult Health

## 2016-02-07 DIAGNOSIS — C3432 Malignant neoplasm of lower lobe, left bronchus or lung: Secondary | ICD-10-CM

## 2016-02-07 DIAGNOSIS — C3492 Malignant neoplasm of unspecified part of left bronchus or lung: Secondary | ICD-10-CM | POA: Diagnosis not present

## 2016-02-14 ENCOUNTER — Ambulatory Visit (INDEPENDENT_AMBULATORY_CARE_PROVIDER_SITE_OTHER): Payer: Medicare HMO | Admitting: Thoracic Surgery (Cardiothoracic Vascular Surgery)

## 2016-02-14 ENCOUNTER — Encounter: Payer: Self-pay | Admitting: Thoracic Surgery (Cardiothoracic Vascular Surgery)

## 2016-02-14 VITALS — Resp 16 | Ht 66.0 in | Wt 133.0 lb

## 2016-02-14 DIAGNOSIS — Z09 Encounter for follow-up examination after completed treatment for conditions other than malignant neoplasm: Secondary | ICD-10-CM

## 2016-02-14 DIAGNOSIS — R918 Other nonspecific abnormal finding of lung field: Secondary | ICD-10-CM

## 2016-02-14 DIAGNOSIS — C3432 Malignant neoplasm of lower lobe, left bronchus or lung: Secondary | ICD-10-CM

## 2016-02-14 DIAGNOSIS — Z6821 Body mass index (BMI) 21.0-21.9, adult: Secondary | ICD-10-CM | POA: Diagnosis not present

## 2016-02-14 DIAGNOSIS — C50919 Malignant neoplasm of unspecified site of unspecified female breast: Secondary | ICD-10-CM | POA: Diagnosis not present

## 2016-02-14 DIAGNOSIS — I10 Essential (primary) hypertension: Secondary | ICD-10-CM | POA: Diagnosis not present

## 2016-02-14 DIAGNOSIS — N644 Mastodynia: Secondary | ICD-10-CM | POA: Diagnosis not present

## 2016-02-14 DIAGNOSIS — C3490 Malignant neoplasm of unspecified part of unspecified bronchus or lung: Secondary | ICD-10-CM | POA: Diagnosis not present

## 2016-02-14 NOTE — Progress Notes (Signed)
[address]       [place] [address]             [phone]       HPI: Melissa Snyder returns for a scheduled follow-up visit.  She is an 82 year old woman who had a left lower lobe superior segmentectomy for stage IA non-small cell carcinoma in June 2016. She has been followed with CT since then. She has multiple groundglass opacities on CT.  Since her last visit she's been put on medication for osteoporosis. She has not had any respiratory issues. She lost about 15 pounds initially after surgery and has never gained that back. Her appetite is fair, but unchanged. She's not had any unusual headaches or visual changes. She has arthritis but does not have any new or unusual bone or joint pain.  Past Medical History:  Diagnosis Date  . Blind left eye 10/2013   cva  . Breast cancer ([place]) 05/2014   ER+/PR+/Her2-  . Bundle branch block left   . Chest pain   . Family history of bladder cancer   . Family history of breast cancer   . Family history of prostate cancer   . GERD (gastroesophageal reflux disease)   . History of blood transfusion    age 85  . Hyperlipidemia   . Hypertension   . Hypothyroid   . Lung nodule   . Melanoma in situ of face ([place]) JUNE 2015   LEFT CHEEK  . Meningioma ([place])    brain lining  . Migraine    reports only having one  . Osteoarthritis   . Radiation 09/16/14-10/07/14   Right  Breast  21 fractions  . Skin cancer   . Stroke [facility]) 2015   Blind in left eye as a result     Current Outpatient Prescriptions  Medication Sig Dispense Refill  . aspirin 81 MG chewable tablet Chew 1 tablet (81 mg total) by mouth daily. 30 tablet 0  . atenolol (TENORMIN) 50 MG tablet Take 50 mg by mouth at bedtime.     Marland Kitchen b complex vitamins capsule Take 1 capsule by mouth daily.    . calcium-vitamin D (OSCAL WITH D) 500-200 MG-UNIT per tablet Take 1 tablet by mouth every morning.    . cephALEXin (KEFLEX) 250 MG capsule Take 1 capsule (250 mg total) by  mouth 4 (four) times daily. 28 capsule 0  . fish oil-omega-3 fatty acids 1000 MG capsule Take 1 g by mouth every morning.     . ibandronate (BONIVA) 150 MG tablet Take 150 mg by mouth every 30 (thirty) days. Take in the morning with a full glass of water, on an empty stomach, and do not take anything else by mouth or lie down for the next 30 min.    Marland Kitchen levothyroxine (SYNTHROID, LEVOTHROID) 112 MCG tablet Take 112 mcg by mouth every morning.     Marland Kitchen lisinopril (PRINIVIL,ZESTRIL) 10 MG tablet Take 1 tablet (10 mg total) by mouth daily. (Patient taking differently: Take 10 mg by mouth 2 (two) times daily. ) 30 tablet 1  . Multiple Vitamin (MULTIVITAMIN) tablet Take 1 tablet by mouth every morning.     . Multiple Vitamins-Minerals (VISION FORMULA PO) Take 1 capsule by mouth daily.    . vitamin E (E-400) 400 UNIT capsule Take 400 Units by mouth 3 (three) times a week. Mon Wed Fri     No current facility-administered medications for this visit.     Physical  Exam Resp 16   Ht '5\' 6"'  (1.676 m)   Wt 133 lb (60.3 kg)   SpO2 98% Comment: RA  BMI 21.65 kg/m  82 year old woman in no acute distress Alert and oriented 3 with no focal deficits No cervical or supraclavicular adenopathy Cardiac regular rate and rhythm normal S1 and S2 Lungs clear with equal breath sounds bilaterally Incisions well healed  Diagnostic Tests: CT CHEST WITHOUT CONTRAST  TECHNIQUE: Multidetector CT imaging of the chest was performed following the standard protocol without IV contrast.  COMPARISON:  CT chest 08/11/2015  FINDINGS: Cardiovascular: Coronary artery calcification and aortic atherosclerotic calcification.  Mediastinum/Nodes: No axillary supraclavicular adenopathy.  Lungs/Pleura: Bilateral ground-glass nodules again noted. Largest nodule in the LEFT upper lobe measures 7 mm (image 25, series 4) not changed fromprior. Example nodule in the RIGHT upper lobe measures 5 mm on image 34, series 4 compared  to 5 mm. No new or enlarged ground-glass nodules are present.  There is linear thickening at the lung bases along the fissures. There is biapical pleural parenchymal thickening unchanged.  Upper Abdomen: Limited view of the liver, kidneys, pancreas are unremarkable. Normal adrenal glands.  Musculoskeletal: Degenerate endplate changes. Hemangioma in the lower thoracic spine. No aggressive osseous lesion.  IMPRESSION: 1. No change in size or number of bilateral ground-glass nodules. 2. Stable pleural-parenchymal thickening and scarring. 3. No evidence of lung cancer recurrence. 4. Degenerate changes of the spine. 5. Coronary artery calcification and aortic atherosclerotic calcification.   Electronically Signed   By: Suzy Bouchard M.D.   On: 02/07/2016 14:22 I personally reviewed the CT chest and concur with the findings as noted above.  Impression: 81 year old woman who is now a little over a year out from a superior segmentectomy for a stage IA non-small cell carcinoma. She has no evidence of recurrent disease.  She does have multiple groundglass opacities bilaterally. These have been stable over the past 6 months. They do need continued follow-up with CT. She also needs follow-up from the standpoint of her resection. We will plan to do another CT in 6 months.  Hypertension- she says that her blood pressure was normal at Dr. Silvestre Mesi office this morning. He is managing that issue.  Aortic atherosclerosis- noted with calcification on CT- unchanged, she is on aspirin and beta blocker.  Plan: Return in 6 months with CT chest  [name], MD [facility] [phone]

## 2016-02-16 ENCOUNTER — Other Ambulatory Visit: Payer: Self-pay | Admitting: Internal Medicine

## 2016-02-16 DIAGNOSIS — N644 Mastodynia: Secondary | ICD-10-CM

## 2016-02-17 IMAGING — CT CT CHEST W/O CM
2 of 4 series · 15 of 36 positions shown, 18 images · non-contrast
Comparison: Chest CT 05/25/2014 and 02/11/2014.  PET-CT 02/23/2014.

CLINICAL DATA: Follow up mediastinal adenopathy. History of left
cheek melanoma in November 2013 and right breast cancer in May 2014. Undergoing radiation therapy. Subsequent encounter.

EXAM:
CT CHEST WITHOUT CONTRAST
TECHNIQUE: Multidetector CT imaging of the chest was performed following the
standard protocol without IV contrast.

[Series 3: chest 5.0 i41s 3 · axial · 0.60mm/px · z∈[-342,-57]mm · 12 of 67 slices shown, 15 images]
[im 5/67  mediastinal]
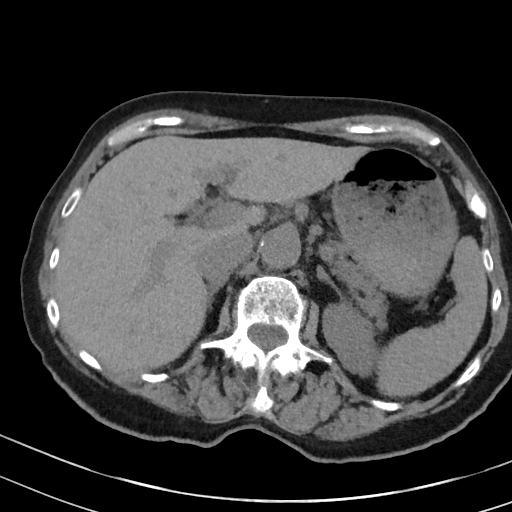
[im 5/67  lung]
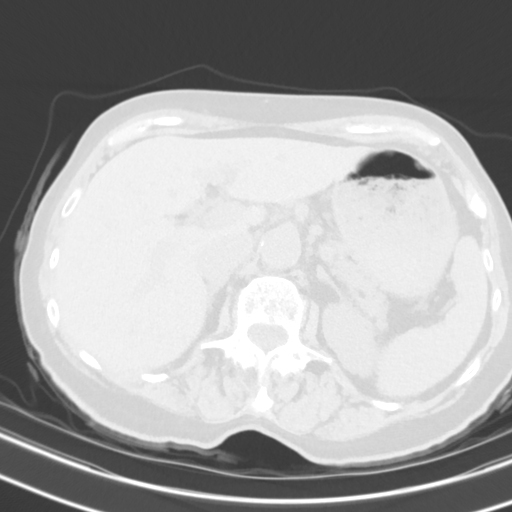
[im 10/67  lung]
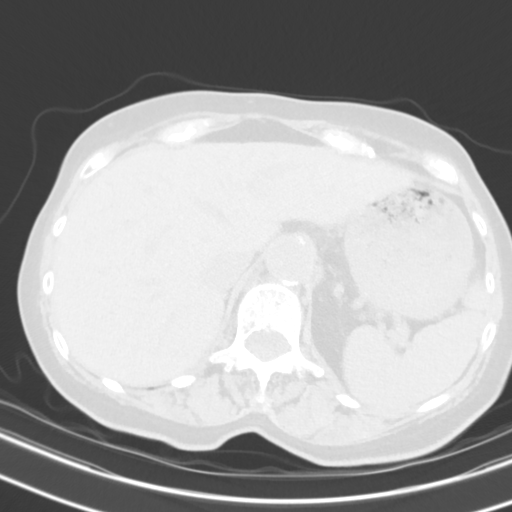
[im 15/67  lung]
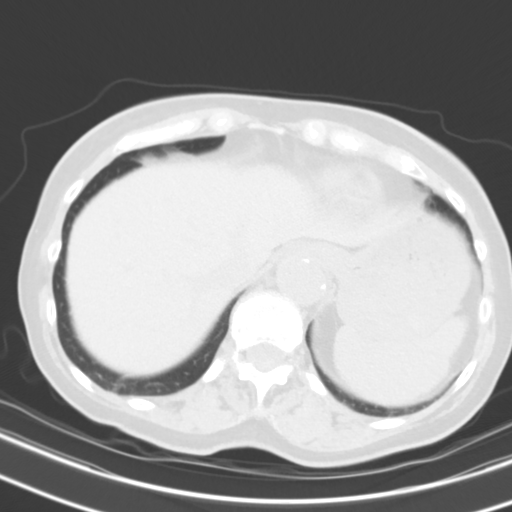
[im 19/67  lung]
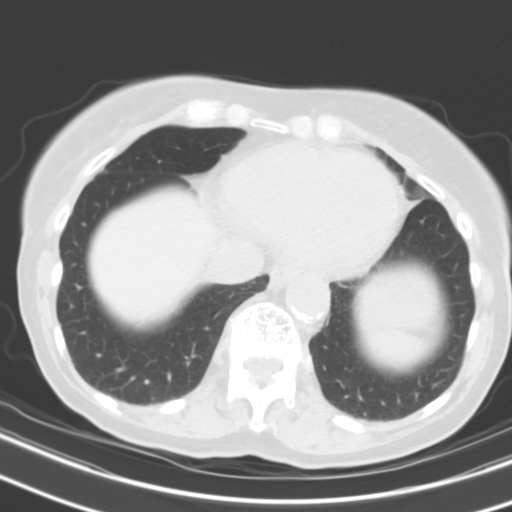
[im 24/67  mediastinal]
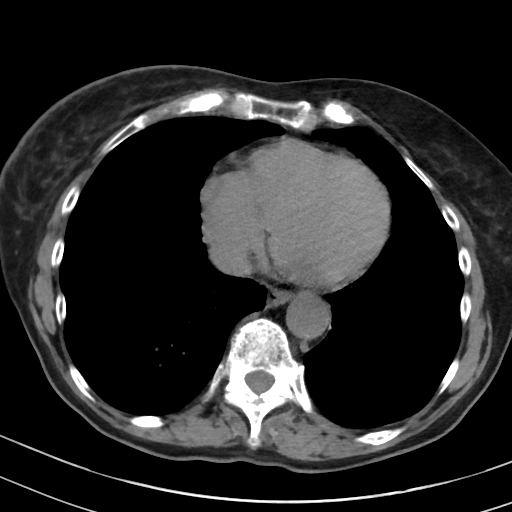
[im 24/67  lung]
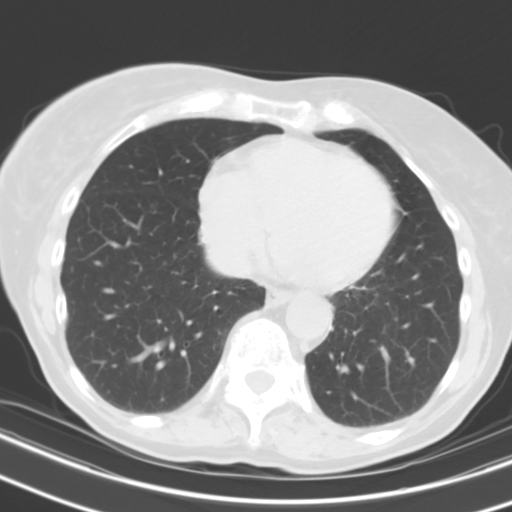
[im 29/67  lung]
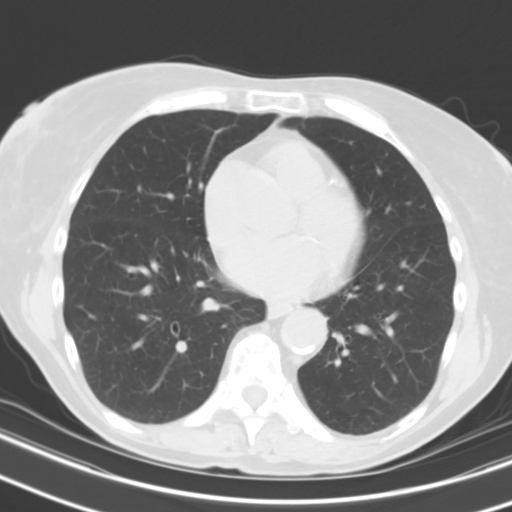
[im 38/67  lung]
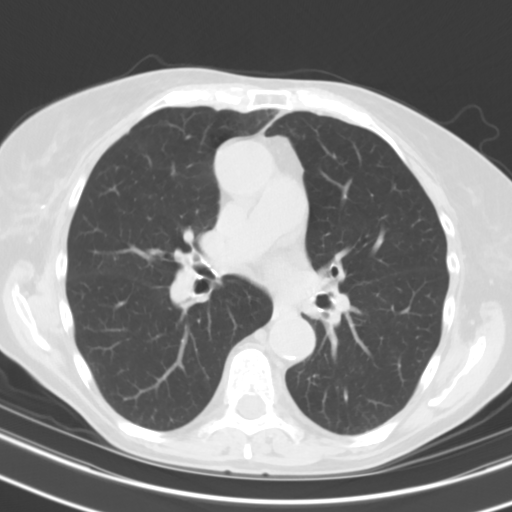
[im 43/67  lung]
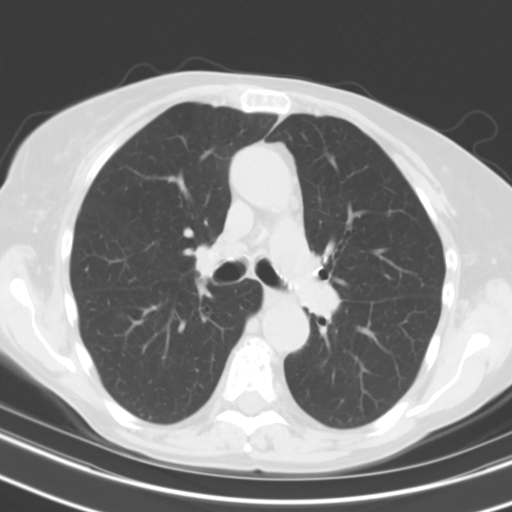
[im 48/67  mediastinal]
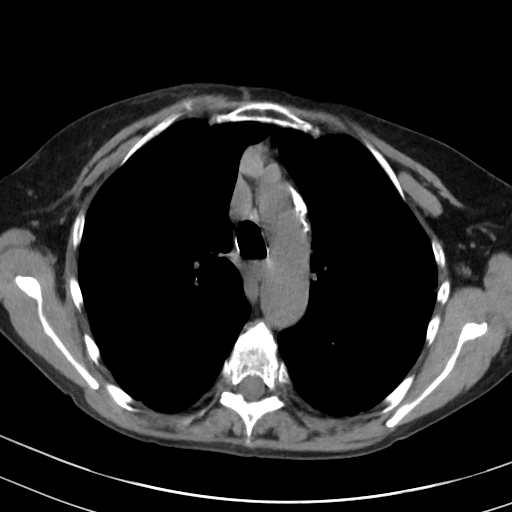
[im 48/67  lung]
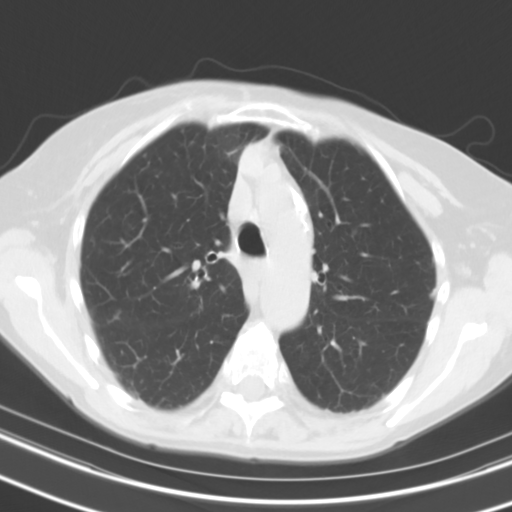
[im 52/67  lung]
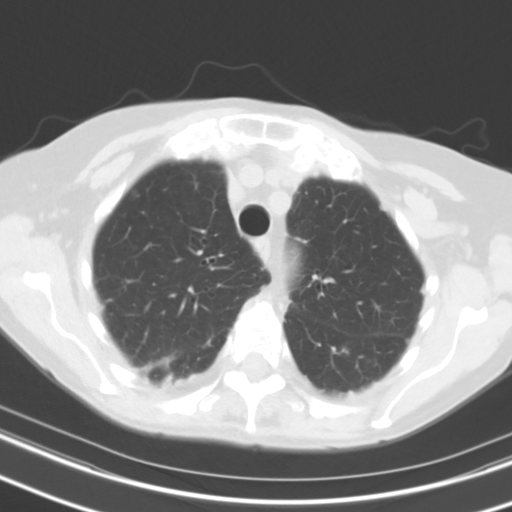
[im 57/67  lung]
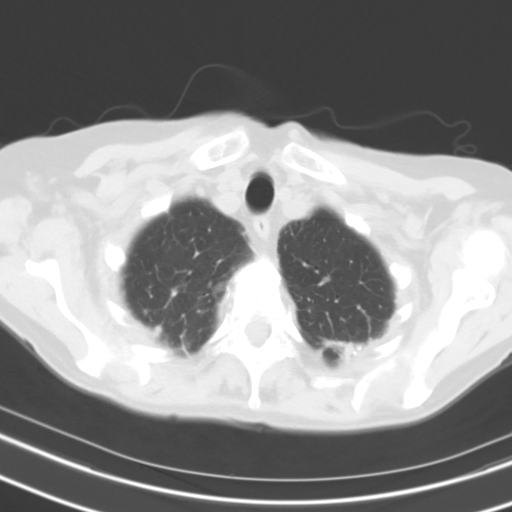
[im 62/67  lung]
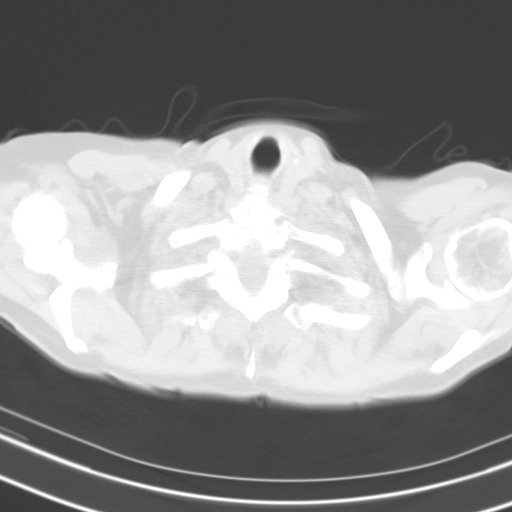

[Series 4: cor 2.0 · coronal · 0.61mm/px · 3 of 125 slices shown]
[im 25/125  lung]
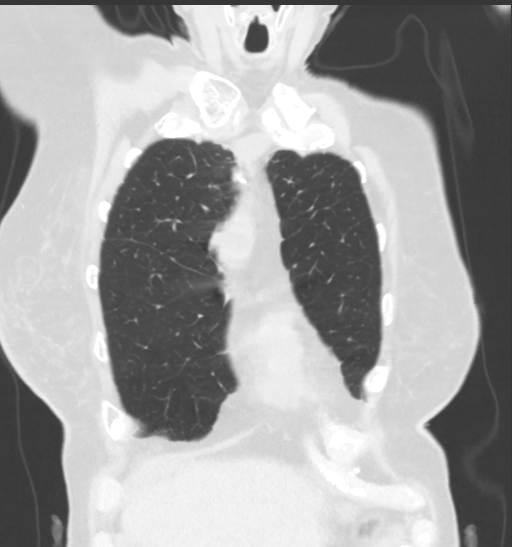
[im 50/125  lung]
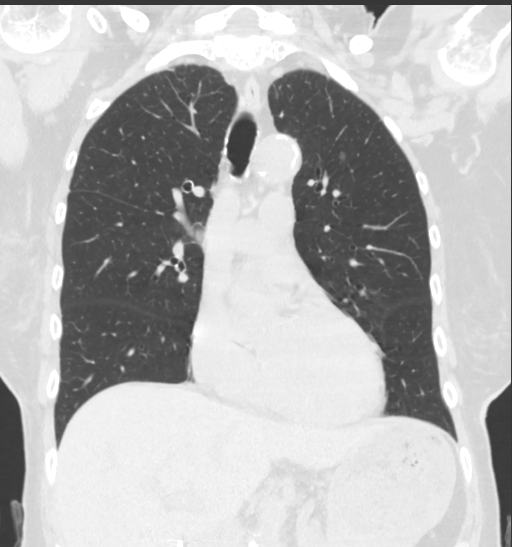
[im 75/125  lung]
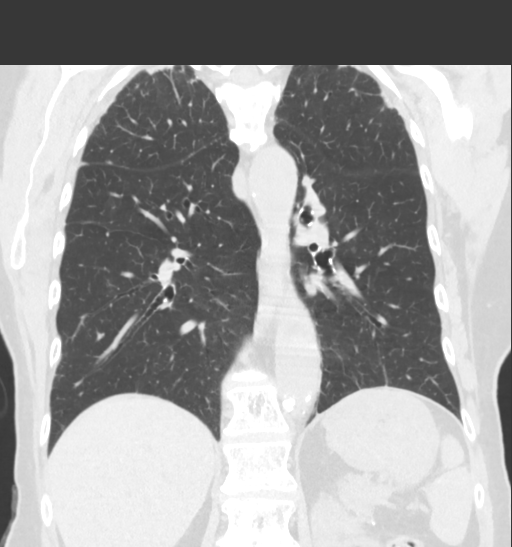

[15 of 36 positions shown; findings below may reference images not displayed]

FINDINGS: Mediastinum/Nodes: There are no pathologically enlarged mediastinal,
hilar, internal mammary or axillary lymph nodes. New surgical clips
are present within the right breast. The thyroid gland, trachea and
esophagus demonstrate no significant findings. The heart size is
normal. There is no pericardial effusion.Atherosclerosis of the
aorta, great vessels and Coronary artery is again noted.

Lungs/Pleura: There is no pleural effusion. There is stable biapical
scarring. Scattered right upper lobe ground-glass densities are
stable, most prominent on images 17 and 18. There is a stable left
upper lobe ground-glass density on image 13. The dominant lesion
within the superior segment of the left lower lobe has not changed
in overall size, measuring 16 x 13 mm on image 22. However, it
central solid component has enlarged, measuring 9 mm on image 23.

Upper abdomen:  Unremarkable.  There is no adrenal mass.

Musculoskeletal/Chest wall: There is no chest wall mass or
suspicious osseous finding.
IMPRESSION: 1. Further enlargement of the central solid component within the sub
solid left lower lobe pulmonary nodule. This finding remains
concerning for adenocarcinoma.
2. The additional scattered small ground-glass nodules in both lungs
and biapical scarring are stable.
3. No evidence of adenopathy.

## 2016-02-21 ENCOUNTER — Ambulatory Visit
Admission: RE | Admit: 2016-02-21 | Discharge: 2016-02-21 | Disposition: A | Discharge status: Home / Self Care | Payer: Medicare HMO | Source: Ambulatory Visit | Attending: Internal Medicine | Admitting: Internal Medicine

## 2016-02-21 DIAGNOSIS — N644 Mastodynia: Secondary | ICD-10-CM

## 2016-02-27 ENCOUNTER — Telehealth: Payer: Self-pay | Admitting: Hematology

## 2016-02-27 NOTE — Telephone Encounter (Signed)
Faxed pt records to [facility] healthcare [phone]

## 2016-03-15 DIAGNOSIS — D225 Melanocytic nevi of trunk: Secondary | ICD-10-CM | POA: Diagnosis not present

## 2016-03-15 DIAGNOSIS — Z85820 Personal history of malignant melanoma of skin: Secondary | ICD-10-CM | POA: Diagnosis not present

## 2016-03-15 DIAGNOSIS — L821 Other seborrheic keratosis: Secondary | ICD-10-CM | POA: Diagnosis not present

## 2016-03-15 DIAGNOSIS — L814 Other melanin hyperpigmentation: Secondary | ICD-10-CM | POA: Diagnosis not present

## 2016-03-15 DIAGNOSIS — D1801 Hemangioma of skin and subcutaneous tissue: Secondary | ICD-10-CM | POA: Diagnosis not present

## 2016-04-19 IMAGING — DX DG CHEST 2V
2 series · 2 of 2 positions shown · non-contrast
Comparison: 11/27/2014

CLINICAL DATA: Status post partial lobectomy of left lung.

EXAM:
CHEST  2 VIEW

[chest pa]
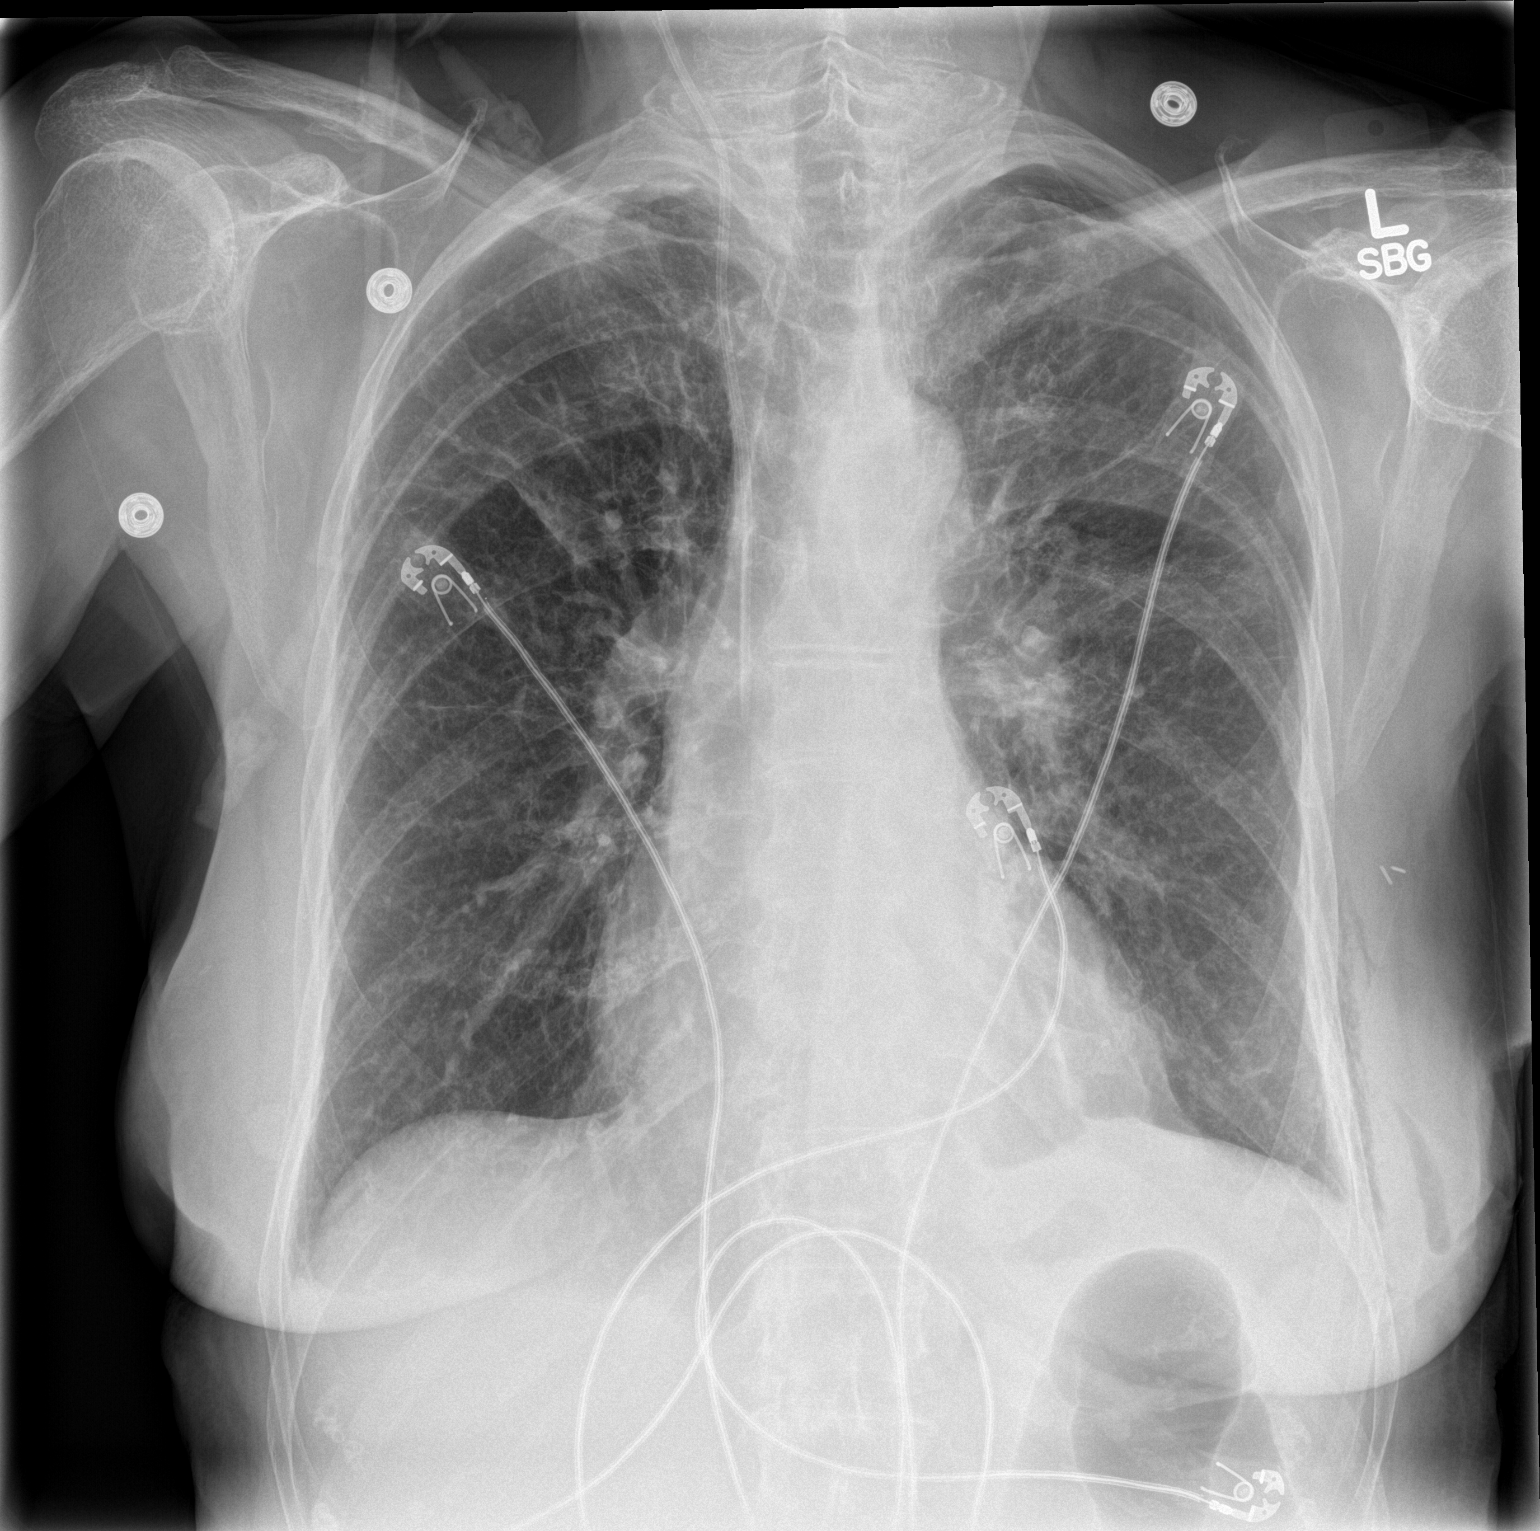

[chest lat]
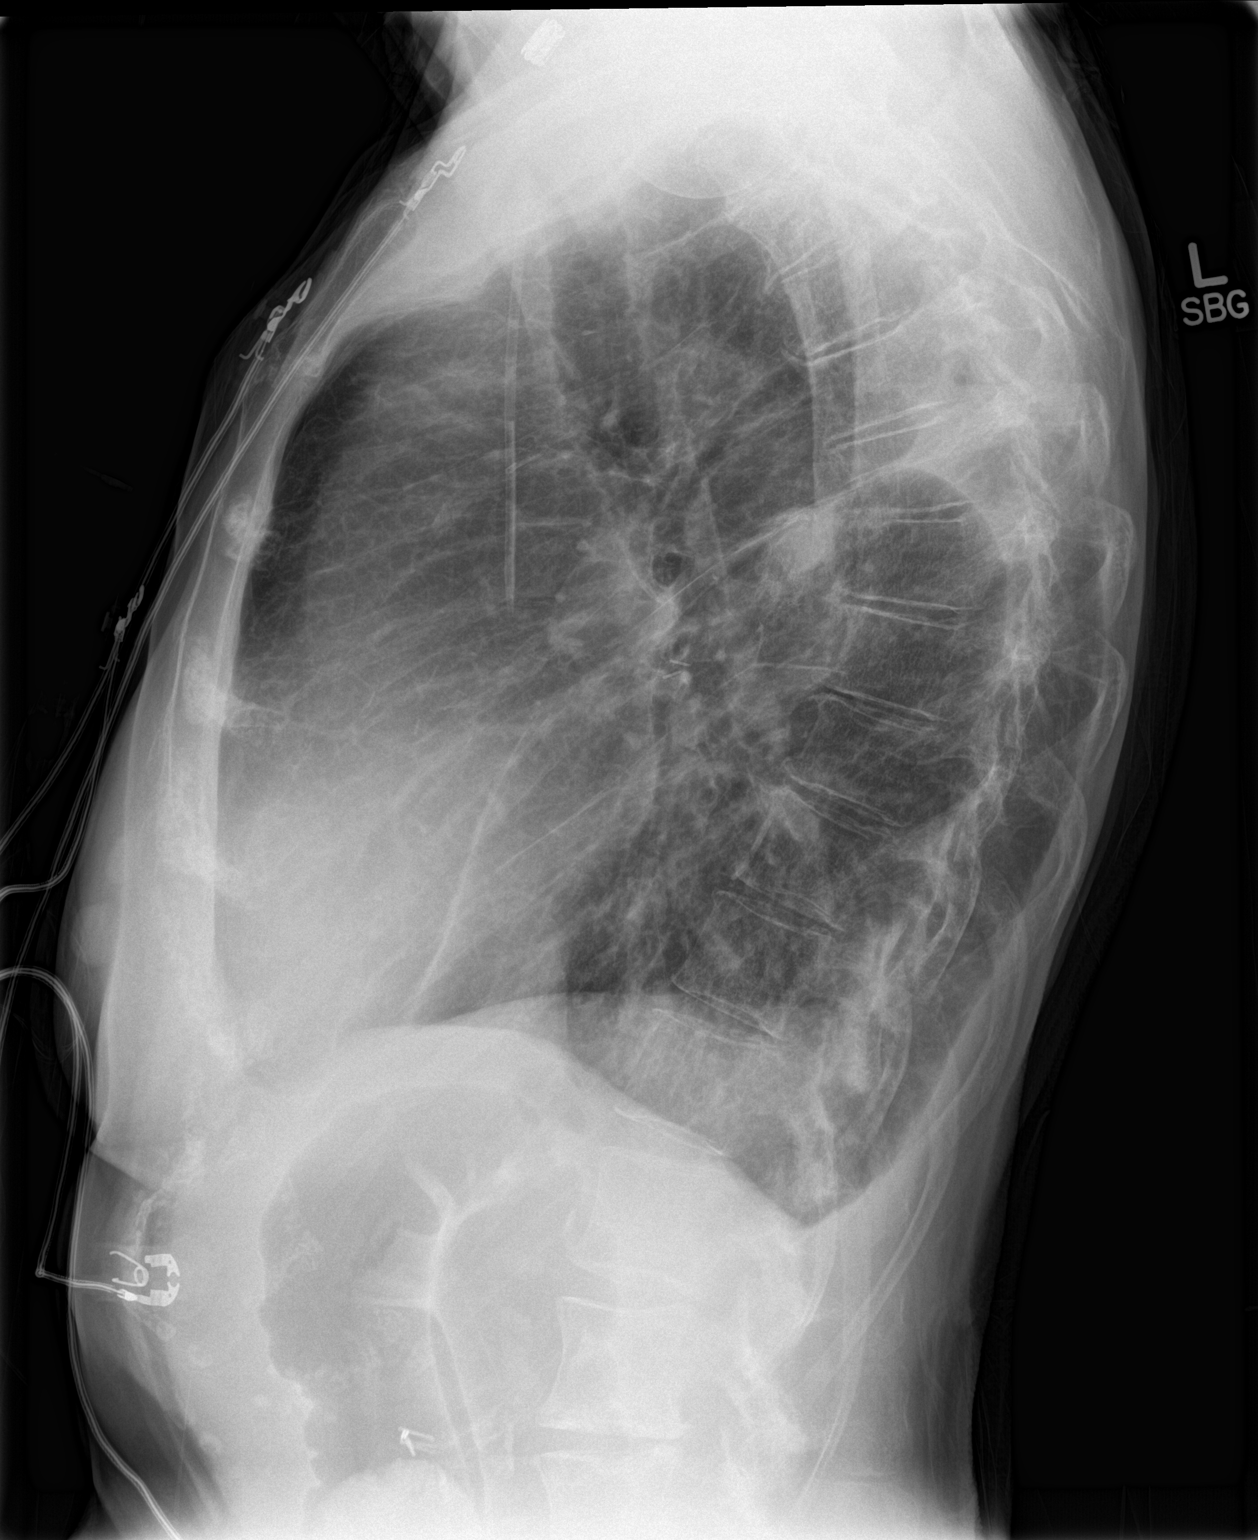

[2 of 2 positions shown; findings below may reference images not displayed]

FINDINGS: The right IJ catheter tip is in the cavoatrial junction. Normal
heart size. Postop changes involving the left lung again noted.
Small left apical pneumothorax is unchanged. Chronic interstitial
coarsening noted bilaterally. Stable scar versus platelike
atelectasis in the left base.
IMPRESSION: Persistent small left apical pneumothorax.

## 2016-05-11 ENCOUNTER — Encounter: Payer: Medicare HMO | Admitting: Adult Health

## 2016-05-11 ENCOUNTER — Telehealth: Payer: Self-pay | Admitting: Adult Health

## 2016-05-11 NOTE — Telephone Encounter (Signed)
I received a voicemail from Melissa Snyder apologizing for missing her scheduled appointment with me this morning.   Scheduling request placed for a call to be made to the patient to reschedule.   Mike Craze, NP [place] [phone]

## 2016-05-16 ENCOUNTER — Telehealth: Payer: Self-pay | Admitting: Hematology

## 2016-05-16 NOTE — Telephone Encounter (Signed)
lvm to inform pt of r/s LTS appt per LOS to 12/21 at 1 pm

## 2016-05-22 ENCOUNTER — Telehealth: Payer: Self-pay | Admitting: Hematology

## 2016-05-22 NOTE — Telephone Encounter (Signed)
Mailed to [facility]/ risk adjustment

## 2016-05-24 ENCOUNTER — Encounter: Payer: Medicare HMO | Admitting: Adult Health

## 2016-05-25 ENCOUNTER — Telehealth: Payer: Self-pay | Admitting: Adult Health

## 2016-05-25 ENCOUNTER — Encounter: Payer: Self-pay | Admitting: Adult Health

## 2016-05-25 ENCOUNTER — Ambulatory Visit (HOSPITAL_BASED_OUTPATIENT_CLINIC_OR_DEPARTMENT_OTHER): Payer: Medicare HMO | Admitting: Adult Health

## 2016-05-25 VITALS — BP 181/71 | HR 70 | Temp 97.3°F | Resp 17 | Ht 66.0 in | Wt 132.9 lb

## 2016-05-25 DIAGNOSIS — C50411 Malignant neoplasm of upper-outer quadrant of right female breast: Secondary | ICD-10-CM

## 2016-05-25 DIAGNOSIS — Z85118 Personal history of other malignant neoplasm of bronchus and lung: Secondary | ICD-10-CM

## 2016-05-25 DIAGNOSIS — C3432 Malignant neoplasm of lower lobe, left bronchus or lung: Secondary | ICD-10-CM

## 2016-05-25 DIAGNOSIS — Z170 Estrogen receptor positive status [ER+]: Secondary | ICD-10-CM

## 2016-05-25 DIAGNOSIS — Z853 Personal history of malignant neoplasm of breast: Secondary | ICD-10-CM

## 2016-05-25 NOTE — Progress Notes (Signed)
CLINIC:  Cancer Survivorship   REASON FOR VISIT:  Routine follow-up for recent history of breast and lung cancers.  BRIEF ONCOLOGIC HISTORY:  Oncology History   Breast cancer (Right breast)   Staging form: Breast, AJCC 7th Edition     Pathologic stage from 10/17/2014: Stage IA (T1b, N0, cM0) - Signed by [name], MD on 10/17/2014 Lung cancer (LLL)   Staging form: Lung, AJCC 7th Edition     Clinical stage from 12/14/2014: Stage IA (T1a, N0, M0) - Unsigned       Breast cancer of upper-outer quadrant of right female breast ([place])   05/04/2014 Initial Diagnosis    Breast cancer      05/19/2014 Imaging    Diagnostic mammogram and ultrasound showed a 7 mm irregular suspicious mass located in the right breast at the 10:00 position, 7 cm from the nipple. Otherwise negative.      05/19/2014 Initial Biopsy    Right breast biopsy (UOQ): IDC, grade 2.       05/19/2014 Receptors her2    ER 100% positive, PR 35% positive, HER-2 negative, Ki67 8%      06/07/2014 Miscellaneous    Genetic testing normal.  APC, ATM, BARD1, BMPR1A, BRCA1, BRCA2, BRIP1, CDH1, CDK4, CDKN2A, CHEK2, EPCAM, GREM1, MLH1, MRE11A, MSH2, MSH6, MUTYH, NBN, NF1, PALB2, PMS2, POLD1, POLE, PTEN, RAD50, RAD51D, SMAD4, SMARCA4, STK11, and TP53 tested.       07/23/2014 Surgery    Right breast lumpectomy with SLNB Lucia Gaskins) showed invasive ductal carcinoma & DCIS; negative surgical margins.      07/23/2014 Pathology Results    Invasive ductal carcinoma, tumor size 0.8 cm, grade 1, no lymphovascular invasion, margins were negative, 2 sentinel lymph nodes were negative. (+) DCIS.      07/23/2014 Pathologic Stage    pT1b, pN0: Stage IA       09/16/2014 - 10/07/2014 Radiation Therapy    XRT completed Pablo Ledger). Right breast/ 42.72 Gy at 2.67 Gy per fraction x 21 fractions.       01/2015 -  Anti-estrogen oral therapy    Aromasin prescribed and recommended.  Patient declines anti-estrogen therapy at this time d/t high  co-pay of medication and possible side effects of AI therapy.       05/02/2015 Mammogram    Diagnostic TOMO bilat mammogram: No evidence of malignancy in either breast. Lumpectomy changes in right breast. Diagnostic mammo suggested in 1 year.        Primary cancer of left lower lobe of lung ([place])   10/25/2013 Imaging    CXR done for stroke protocol. No acute chest findings. Chronic lung disease with biapical scarring. Developing nodule in (L) apex cannot be excluded based on exam, athough may be d/t overlap of osseous structures.       10/27/2013 Imaging    CXR: Nodular opacity in mid (L) apex and appears more prominent than prior study. Area that appeared masslike in LUL near apex on recent CXR not seen at this time.  Underlying emphysema present. No edema or consolidation      11/12/2013 Imaging    CT chest: At left lung apex, there is asymmetric pleural parenchymal scarring. No discrete lesion.  In superior segment of LLL, there are 1 dominant ground-glass opacity measuring 16 mm. 2 addt'l ground-glass opacities appreciated. F/U with CT in 3 months      02/11/2014 Imaging    CT chest: Several small bilat faint nodular densities, largest measures 1.3 cm over superior segment LLL. No  new nodules, no adenopathy. Cannot exclude metastatic disease given pt's h/o melanoma. Recommend PET-CT      02/23/2014 PET scan    Persistent semi-solid nodule in superior segment LLL with low metabolic activity. Moderately metabolic subcarinal & hilar LN are likely reactive.       05/25/2014 Imaging    CT chest: Persistent & similar size dominant LLL sub-solid nodule. Adenocarcinoma is considered. Biopsy or resection strongly considered. Additional scattered small ground-glass nodules stable.       06/02/2014 Imaging    MRI brain: Grossly stable appearance of left planum sphenoidale meningioma measuring 20 x 14 mm. Meningioma does not extend to left orbital apex & contacts left optic nerve. Stable right  posterior clinoid meningioma .       09/27/2014 Imaging    CT chest: Further enlargment of central solid component in sub-solid LLL pulm nodule. Findings remain concerning for adenoca. Additional scattered small bilat ground-glass nodules and biapical scarring stable. No adenopathy.        11/25/2014 Surgery    Video bronchoscopy with endobronchial ultrasound; Video assisted thoracoscopy; Thoracoscopic LLL superior segmentectomy; Mediastinal lymph node dissection; Cyro intercostal nerve block Roxan Hockey)      11/25/2014 Pathology Results    Superior segment lung resection (LLL). Adenocarcinoma, well-diff, spanning 1.5 cm. Surgical margins negative. 5 LNs negative.       11/25/2014 Pathologic Stage    pT1a, pN0: Stage IA       11/25/2014 Initial Diagnosis    Primary cancer of left lower lobe of lung ([place])      05/31/2015 Imaging    CT chest: No evidence of local recurrent at superior segmentectomy in LLL. New small nodular focus of consolidation surrounding thin parenchymal band. Additional sub-cm ground-glass and solid nodules favored to be benign. No thoracic adenopathy      08/11/2015 Imaging    CT chest: No evidence of residual or recurrent tumor in LLL. Stable small nodular focus in LLL with thin surrounding parenchymal band, favored to be benign.  Multiple small ground-glass nodules unchanged.        INTERVAL HISTORY:  Melissa Snyder presents to the Survivorship Clinic today for routine follow-up for her history of breast & lung cancers.      Overall, [name] reports feeling quite well since her last visit to the cancer center.  She continues to have some chronic left-sided chest discomfort. She states, "My left chest just doesn't feel right since my lung surgery. It doesn't feel numb, but it just doesn't feel normal."      Her appetite and energy levels are good. She feels like her hearing is gradually getting worse; she has had an audiogram in the past that showed some hearing  loss but not enough for hearing aids; she has difficulty with some consonant sounds, but not volume.    She has some urge urinary incontinence; she tells me that when her husband had prostate cancer he did some pelvic floor exercises and his symptoms improved. She can't find his paperwork so that she could do the exercises for herself at home.  I offered to send her for formal pelvic floor rehab referral, but she declines at this time.    She feels like she is forgetful. She forgot where she parked once while shopping and could not find her car.  She also forgot to come to her last appointment with me because "I just got so excited about the snow outside and forgot to come to my appointment."  Otherwise, she has not noted any changes in her cognitive function.   She has some arthritis pain, particularly to her hands, wrists, and feet.  She manages it without intervention.  She started Boniva about 2 months ago, under the care of her PCP.    REVIEW OF SYSTEMS:  Review of Systems  Constitutional: Negative.   HENT: Positive for hearing loss.   Eyes:       Blind in (L) eye  Respiratory: Negative for cough and shortness of breath.        "The left side of my chest/back just doesn't feel normal since my lung surgery"   Cardiovascular: Negative.   Gastrointestinal: Negative.  Negative for constipation and diarrhea.  Genitourinary: Negative for dysuria and hematuria.  Musculoskeletal: Positive for joint pain.  Skin: Negative.   Endo/Heme/Allergies: Negative.   Psychiatric/Behavioral: Negative.  The patient is not nervous/anxious.     ONCOLOGY TREATMENT TEAM (Breast):  -Surgeon:  Dr. Lucia Gaskins at [facility] Oncologist: Dr. Burr Medico -Radiation Oncologist: Dr. Pablo Ledger  ONCOLOGY TREATMENT TEAM (Lung):  -Cardiothoracic Surgeon: Dr. Roxan Hockey   PAST MEDICAL/SURGICAL HISTORY:  Past Medical History:  Diagnosis Date  . Blind left eye 10/2013   cva  . Breast cancer ([place])  05/2014   ER+/PR+/Her2-  . Bundle branch block left   . Chest pain   . Family history of bladder cancer   . Family history of breast cancer   . Family history of prostate cancer   . GERD (gastroesophageal reflux disease)   . History of blood transfusion    age 48  . Hyperlipidemia   . Hypertension   . Hypothyroid   . Lung nodule   . Melanoma in situ of face ([place]) JUNE 2015   LEFT CHEEK  . Meningioma ([place])    brain lining  . Migraine    reports only having one  . Osteoarthritis   . Radiation 09/16/14-10/07/14   Right  Breast  21 fractions  . Skin cancer   . Stroke [facility]) 2015   Blind in left eye as a result   Past Surgical History:  Procedure Laterality Date  . ABDOMINAL HYSTERECTOMY  ~ 1997  . APPENDECTOMY     age 61  . BILATERAL SALPINGOOPHORECTOMY    . BLADDER REPAIR  2009   cystocele  . BUNIONECTOMY     bilateral great toe  . CARDIAC CATHETERIZATION  2004  . CHOLECYSTECTOMY  ~ 1997  . COLONOSCOPY    . CRYO INTERCOSTAL NERVE BLOCK Left 11/25/2014   Procedure: CRYO INTERCOSTAL NERVE BLOCK;  Surgeon: [name], MD;  Location: [facility];  Service: Thoracic;  Laterality: Left;  . CT RADIATION THERAPY GUIDE     Gamma radiation -lt frontal-[facility]  . DILATION AND CURETTAGE OF UTERUS     X 2  . MELANOMA EXCISION Left 11/05/23, 11/30/13   CHEEK  . OTHER SURGICAL HISTORY     Gamma knife to Meningioma in brain lining  . SEGMENTECOMY Left 11/25/2014   Procedure: LEFT LOWER LOBE SEGMENTECTOMY;  Surgeon: [name], MD;  Location: [facility];  Service: Thoracic;  Laterality: Left;  Marland Kitchen VIDEO ASSISTED THORACOSCOPY Left 11/25/2014   Procedure: VIDEO ASSISTED THORACOSCOPY;  Surgeon: [name], MD;  Location: [facility];  Service: Thoracic;  Laterality: Left;  Marland Kitchen VIDEO BRONCHOSCOPY WITH ENDOBRONCHIAL ULTRASOUND N/A 11/25/2014   Procedure: VIDEO BRONCHOSCOPY WITH ENDOBRONCHIAL ULTRASOUND;  Surgeon: [name], MD;  Location: [facility];  Service: Thoracic;  Laterality:  N/A;  ALLERGIES:  Allergies  Allergen Reactions  . Beef-Derived Products     ACID REFLUX  . Chocolate     Acid Reflux  . Hydrocodone Other (See Comments)    Couldn't sleep  . Other     FRIED FOODS  . Tramadol Itching  . Ibuprofen Rash and Other (See Comments)    Causes mouth sores  . Niacin And Related Other (See Comments)    Flushing, burning, redness     CURRENT MEDICATIONS:  Current Outpatient Prescriptions on File Prior to Visit  Medication Sig Dispense Refill  . aspirin 81 MG chewable tablet Chew 1 tablet (81 mg total) by mouth daily. 30 tablet 0  . atenolol (TENORMIN) 50 MG tablet Take 50 mg by mouth at bedtime.     Marland Kitchen b complex vitamins capsule Take 1 capsule by mouth daily.    . calcium-vitamin D (OSCAL WITH D) 500-200 MG-UNIT per tablet Take 1 tablet by mouth every morning.    . fish oil-omega-3 fatty acids 1000 MG capsule Take 1 g by mouth every morning.     . ibandronate (BONIVA) 150 MG tablet Take 150 mg by mouth every 30 (thirty) days. Take in the morning with a full glass of water, on an empty stomach, and do not take anything else by mouth or lie down for the next 30 min.    Marland Kitchen levothyroxine (SYNTHROID, LEVOTHROID) 112 MCG tablet Take 112 mcg by mouth every morning.     Marland Kitchen lisinopril (PRINIVIL,ZESTRIL) 10 MG tablet Take 1 tablet (10 mg total) by mouth daily. (Patient taking differently: Take 10 mg by mouth 2 (two) times daily. ) 30 tablet 1  . Multiple Vitamin (MULTIVITAMIN) tablet Take 1 tablet by mouth every morning.     . Multiple Vitamins-Minerals (VISION FORMULA PO) Take 1 capsule by mouth daily.    . vitamin E (E-400) 400 UNIT capsule Take 400 Units by mouth 3 (three) times a week. Mon Wed Fri     No current facility-administered medications on file prior to visit.      ONCOLOGIC FAMILY HISTORY:  Family History  Problem Relation Age of Onset  . CAD Mother     CABG  . Hyperlipidemia Mother   . AAA (abdominal aortic aneurysm) Mother   . Renal  Disease Father   . Kidney disease Father   . CAD Father   . Cancer Father     PROSTATE  . Diabetes Brother   . Hyperlipidemia Brother   . Breast cancer Daughter 81  . Breast cancer Sister 60  . Breast cancer Sister 65  . Cancer Brother     NOS  . Bladder Cancer Brother 7  . Breast cancer Cousin     five paternal first cousins  . Lung cancer Cousin     3 paternal cousins with lung cancer  . Cancer Cousin     4 paternal first cousins with Cancer NOS  . Cancer Cousin     1 paternal cousin with oral cancer (tongue)     GENETIC COUNSELING/TESTING: 06/07/14: Genetic testing normal.  Genes analyzed: APC, ATM, BARD1, BMPR1A, BRCA1, BRCA2, BRIP1, CDH1, CDK4, CDKN2A, CHEK2, EPCAM, GREM1, MLH1, MRE11A, MSH2, MSH6, MUTYH, NBN, NF1, PALB2, PMS2, POLD1, POLE, PTEN, RAD50, RAD51D, SMAD4, SMARCA4, STK11, and TP53 tested.   SOCIAL HISTORY:  Ms. Grassel is married and lives with her husband in [place], [place].  She has 4 children from a previous relationship.  She has 7 grandchildren and 3 great-grandchildren.  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  She is originally from [place], [place].  In her free time, she enjoys tending to her yard/garden and reading.  She denies any current tobacco or alcohol use.      PHYSICAL EXAMINATION:  Vital Signs:   Vitals:   05/25/16 1157  BP: (!) 181/71  Pulse: 70  Resp: 17  Temp: 97.3 F (36.3 C)   Filed Weights   05/25/16 1157  Weight: 132 lb 14.4 oz (60.3 kg)   General: Well-nourished, well-appearing female in no acute distress.  Unaccompanied today. HEENT: Head is normocephalic.  Pupils equal and reactive to light and accomodation. Conjunctivae clear without exudate.  Sclerae anicteric.  Lymph: No cervical, supraclavicular, infraclavicular, or axillary lymphadenopathy noted on palpation.  Cardiovascular: Regular rate and rhythm. Respiratory: Mildly  diminished breath sounds to LLL, otherwise clear to auscultation bilaterally. Chest expansion symmetric without accessory muscle use.  Breathing non-labored.  Breast exam:  -Left breast: No palpable nodularity or masses. No skin redness, thickness, or peau d'orange.  No nipple discharge.  -Right breast: No palpable nodularity or masses. Radiation changes with mild fibrosis to breast tissue as expected.  Healed lumpectomy scar without nodularity.  -Axilla: No palpable axillary adenopathy bilaterally.  GI: Abdomen soft and round. Non-tender, non-distended. Bowel sounds normoactive. No hepatosplenomegaly.   GU: Deferred.  Neuro: No focal deficits. Steady gait.  Psych: Mood and affect normal and appropriate for situation.  Extremities: No edema. Skin: Warm and dry.   LABORATORY DATA:  None for this visit.  DIAGNOSTIC IMAGING:  Most recent mammogram: 02/21/16   Most recent CT chest: 02/07/16      ASSESSMENT AND PLAN:  Ms. Mcconaughey is a pleasant 82 y.o. female with history of Stage IA invasive ductal carcinoma of her right breast, ER+/PR+/HER2 neg, diagnosed in 05/2014; treated with lumpectomy with SLNB and adjuvant radiation; pt declined adjuvant anti-estrogen therapy; completed treatment on 10/07/14.  She also has a history of Stage IA non-small cell lung cancer of the left lower lobe, diagnosed and treated with superior segment lung resection of LLL on 11/25/14.   1. History of Stage IA breast cancer:  Ms. Roskos is clinically and radiographically without evidence of disease or recurrence of breast cancer.  She will be due for mammogram in 02/2017.  She has declined anti-estrogen therapy and will continue on a program of surveillance going forward.  We discussed that we will have her return to the cancer center in 6 months to see Survivorship NP; after that visit if she remains well, it may be appropriate for her to transition to annual visits at that time.  I encouraged her to call us with any  questions or concerns before her next visit to the cancer center and we could certainly see her sooner, if needed.   2. History of Stage IA lung cancer:  Clinically, she is without evidence of disease. Her last CT chest was in 02/2016 and demonstrated no evidence of residual or recurrent tumor in LLL.  She will follow-up with Dr. Roxan Hockey in 08/2016 with repeat CT chest without contrast.    3. Bone health:  Given Ms. Volkman's age/history of breast cancer, she is at risk for bone demineralization.  Her last DEXA scan that we have records for was done on 08/25/13 revealing osteopenia. She tells me she had a D[facility] at [facility]; she is not sure about the results.  Her PCP is  managing her decreased bone density and has started her on Boniva. Therefore, I will defer futurerecommendations/management for her bone health to her PCP.   -I am not concerned about her reported 2 episodes of forgetfulness at this time.  She remains alert, oriented, and appropriate in her speech and behavior.  I do not think she is experiencing significant cognitive dysfunction at this time.    Dispo:  -Return to cancer center to see Survivorship NP in 6 months for continued surveillance. If all is well at this visit, she can likely transition to annual follow-up.   -Mammogram due in 02/2017; will need to order at June follow-up visit.    A total of 30 minutes of face-to-face time was spent with this patient with greater than 50% of that time in counseling and care-coordination.   Mike Craze, NP Survivorship Program [place] [place] [phone]   Note: PRIMARY CARE PROVIDER Jerlyn Ly, [place] [phone]

## 2016-05-25 NOTE — Telephone Encounter (Signed)
Gave patient avs report and appointments for June.  °

## 2016-06-15 ENCOUNTER — Ambulatory Visit
Admission: RE | Admit: 2016-06-15 | Discharge: 2016-06-15 | Disposition: A | Discharge status: Home / Self Care | Payer: Medicare HMO | Source: Ambulatory Visit | Attending: Adult Health | Admitting: Adult Health

## 2016-06-15 DIAGNOSIS — R928 Other abnormal and inconclusive findings on diagnostic imaging of breast: Secondary | ICD-10-CM | POA: Diagnosis not present

## 2016-06-15 DIAGNOSIS — C50411 Malignant neoplasm of upper-outer quadrant of right female breast: Secondary | ICD-10-CM

## 2016-06-25 DIAGNOSIS — Z79899 Other long term (current) drug therapy: Secondary | ICD-10-CM | POA: Diagnosis not present

## 2016-06-25 DIAGNOSIS — Z85118 Personal history of other malignant neoplasm of bronchus and lung: Secondary | ICD-10-CM | POA: Diagnosis not present

## 2016-06-25 DIAGNOSIS — I10 Essential (primary) hypertension: Secondary | ICD-10-CM | POA: Diagnosis not present

## 2016-06-25 DIAGNOSIS — Z0000 Encounter for general adult medical examination without abnormal findings: Secondary | ICD-10-CM | POA: Diagnosis not present

## 2016-06-25 DIAGNOSIS — Z7983 Long term (current) use of bisphosphonates: Secondary | ICD-10-CM | POA: Diagnosis not present

## 2016-06-25 DIAGNOSIS — M810 Age-related osteoporosis without current pathological fracture: Secondary | ICD-10-CM | POA: Diagnosis not present

## 2016-06-25 DIAGNOSIS — Z6822 Body mass index (BMI) 22.0-22.9, adult: Secondary | ICD-10-CM | POA: Diagnosis not present

## 2016-06-25 DIAGNOSIS — Z7982 Long term (current) use of aspirin: Secondary | ICD-10-CM | POA: Diagnosis not present

## 2016-06-25 DIAGNOSIS — H5440 Blindness, one eye, unspecified eye: Secondary | ICD-10-CM | POA: Diagnosis not present

## 2016-06-25 DIAGNOSIS — E039 Hypothyroidism, unspecified: Secondary | ICD-10-CM | POA: Diagnosis not present

## 2016-07-25 ENCOUNTER — Other Ambulatory Visit: Payer: Self-pay | Admitting: Thoracic Surgery (Cardiothoracic Vascular Surgery)

## 2016-07-25 DIAGNOSIS — C3432 Malignant neoplasm of lower lobe, left bronchus or lung: Secondary | ICD-10-CM

## 2016-08-14 ENCOUNTER — Ambulatory Visit (INDEPENDENT_AMBULATORY_CARE_PROVIDER_SITE_OTHER): Payer: Medicare HMO | Admitting: Thoracic Surgery (Cardiothoracic Vascular Surgery)

## 2016-08-14 ENCOUNTER — Ambulatory Visit
Admission: RE | Admit: 2016-08-14 | Discharge: 2016-08-14 | Disposition: A | Discharge status: Home / Self Care | Payer: Medicare HMO | Source: Ambulatory Visit | Attending: Thoracic Surgery (Cardiothoracic Vascular Surgery) | Admitting: Thoracic Surgery (Cardiothoracic Vascular Surgery)

## 2016-08-14 ENCOUNTER — Encounter: Payer: Self-pay | Admitting: Thoracic Surgery (Cardiothoracic Vascular Surgery)

## 2016-08-14 VITALS — BP 153/75 | HR 80 | Resp 16 | Ht 66.0 in | Wt 135.0 lb

## 2016-08-14 DIAGNOSIS — Z09 Encounter for follow-up examination after completed treatment for conditions other than malignant neoplasm: Secondary | ICD-10-CM

## 2016-08-14 DIAGNOSIS — C3432 Malignant neoplasm of lower lobe, left bronchus or lung: Secondary | ICD-10-CM

## 2016-08-14 DIAGNOSIS — R918 Other nonspecific abnormal finding of lung field: Secondary | ICD-10-CM

## 2016-08-14 NOTE — Progress Notes (Signed)
[address]       ,[place] [address]             [phone]       HPI: Melissa Snyder returns for a scheduled follow-up visit.  She is an 83 year old woman who had a left lower lobe superior segmentectomy for stage IA non-small cell carcinoma in June 2016. She has been followed with CT since then. She has multiple groundglass opacities on CT. They were unchanged at her last visit in September 2017.  In the interim since her last visit she has not had any new medical issues. She denies respiratory issues. She lost about 15 pounds initially after surgery and has gained back about 5 pounds. Her appetite is fair, but unchanged. She's not had any unusual headaches or visual changes. She has arthritis but does not have any new or unusual bone or joint pain.  Past Medical History:  Diagnosis Date  . Blind left eye 10/2013   cva  . Breast cancer ([place]) 05/2014   ER+/PR+/Her2-  . Bundle branch block left   . Chest pain   . Family history of bladder cancer   . Family history of breast cancer   . Family history of prostate cancer   . GERD (gastroesophageal reflux disease)   . History of blood transfusion    age 22  . Hyperlipidemia   . Hypertension   . Hypothyroid   . Lung nodule   . Melanoma in situ of face ([place]) JUNE 2015   LEFT CHEEK  . Meningioma ([place])    brain lining  . Migraine    reports only having one  . Osteoarthritis   . Radiation 09/16/14-10/07/14   Right  Breast  21 fractions  . Skin cancer   . Stroke [facility]) 2015   Blind in left eye as a result      Current Outpatient Prescriptions  Medication Sig Dispense Refill  . aspirin 81 MG chewable tablet Chew 1 tablet (81 mg total) by mouth daily. 30 tablet 0  . atenolol (TENORMIN) 50 MG tablet Take 50 mg by mouth at bedtime.     Marland Kitchen b complex vitamins capsule Take 1 capsule by mouth daily.    . calcium-vitamin D (OSCAL WITH D) 500-200 MG-UNIT per tablet Take 1 tablet by mouth every morning.    . fish  oil-omega-3 fatty acids 1000 MG capsule Take 1 g by mouth every morning.     . ibandronate (BONIVA) 150 MG tablet Take 150 mg by mouth every 30 (thirty) days. Take in the morning with a full glass of water, on an empty stomach, and do not take anything else by mouth or lie down for the next 30 min.    Marland Kitchen levothyroxine (SYNTHROID, LEVOTHROID) 112 MCG tablet Take 112 mcg by mouth every morning.     Marland Kitchen lisinopril (PRINIVIL,ZESTRIL) 10 MG tablet Take 1 tablet (10 mg total) by mouth daily. (Patient taking differently: Take 10 mg by mouth 2 (two) times daily. ) 30 tablet 1  . Multiple Vitamin (MULTIVITAMIN) tablet Take 1 tablet by mouth every morning.     . Multiple Vitamins-Minerals (VISION FORMULA PO) Take 1 capsule by mouth daily.    . vitamin E (E-400) 400 UNIT capsule Take 400 Units by mouth 3 (three) times a week. Mon Wed Fri     No current facility-administered medications for this visit.     Physical Exam BP (!) 153/75 (BP Location: Left Arm, Patient  Position: Sitting, Cuff Size: Large)   Pulse 80   Resp 16   Ht '5\' 6"'  (1.676 m)   Wt 135 lb (61.2 kg)   SpO2 97% Comment: ON RA  BMI 21.79 kg/m  83 yo woman in no acute distress Alert and oriented 3 with no focal deficits No cervical or supraclavicular adenopathy Cardiac regular rate and rhythm normal S1 and S2 Lungs clear bilaterally, no wheezing No peripheral edema  Diagnostic Tests: CT CHEST WITHOUT CONTRAST  TECHNIQUE: Multidetector CT imaging of the chest was performed following the standard protocol without IV contrast.  COMPARISON:  08/11/2015  FINDINGS: Cardiovascular: Normal heart size. Aortic atherosclerosis. Calcification in the LAD and RCA coronary artery.  Mediastinum/Nodes: The trachea appears patent and is midline peer normal appearance of the esophagus. No mediastinal or hilar adenopathy. No axillary or supraclavicular adenopathy.  Lungs/Pleura: There is no pleural effusion. Postsurgical changes within  the left midlung appears stable. Multiple ground-glass attenuating nodules are again noted in both lungs. Stable ground-glass attenuating nodule within the left upper lobe, image 35 of series 4. Stable 7 mm ground-glass nodule within the left upper lobe, image 29 of series 4. Anteromedial left upper lobe ground-glass nodule is unchanged, image 26 of series 4. Within the periphery of the right upper lobe there are areas of subpleural reticulation as well as tree-in-bud nodularity. Bronchiectasis is noted within this area. Right middle lobe tree-in-bud nodularity is also similar to the previous exam.  Upper Abdomen: No acute abnormality.  Musculoskeletal: No chest wall mass or suspicious bone lesions identified.  IMPRESSION: 1. Stable CT of the chest. Postsurgical changes in the left lower lobe are unchanged from previous exam without specific features to suggest recurrence of lung cancer. 2. Multiple ground-glass attenuating nodules are stable from prior exam. 3. Chronic postinflammatory changes noted within the periphery of the right upper lobe and right middle lobe. 4. Aortic atherosclerosis and coronary artery calcifications.   Electronically Signed   By: Kerby Moors M.D.   On: 08/14/2016 13:13 I personally reviewed the CT chest and compared it to her previous film. I concur with the findings noted above. No change in multiple groundglass opacities, no new nodules.  Impression: 83 year old woman who is now a little over a year and a half out from a left lower lobe superior segmentectomy for stage IA non-small cell carcinoma. She has no evidence of recurrence.  Groundglass opacities-multiple groundglass opacities on CT bilaterally. These are unchanged over the past 18 months. Will need continued surveillance.  Coronary and thoracic aortic atherosclerosis- asymptomatic. On omega-3 fish oil.  Plan:  Return in 6 months with CT chest to follow-up multiple groundglass  opacities. That will be her 2 year follow-up visit from the time of surgery.  [name], MD [facility] [phone]

## 2016-08-24 DIAGNOSIS — H5440 Blindness, one eye, unspecified eye: Secondary | ICD-10-CM | POA: Diagnosis not present

## 2016-08-24 DIAGNOSIS — C3432 Malignant neoplasm of lower lobe, left bronchus or lung: Secondary | ICD-10-CM | POA: Diagnosis not present

## 2016-08-24 DIAGNOSIS — C50911 Malignant neoplasm of unspecified site of right female breast: Secondary | ICD-10-CM | POA: Diagnosis not present

## 2016-09-03 DIAGNOSIS — J019 Acute sinusitis, unspecified: Secondary | ICD-10-CM | POA: Diagnosis not present

## 2016-09-03 DIAGNOSIS — Z6821 Body mass index (BMI) 21.0-21.9, adult: Secondary | ICD-10-CM | POA: Diagnosis not present

## 2016-09-03 DIAGNOSIS — R102 Pelvic and perineal pain: Secondary | ICD-10-CM | POA: Diagnosis not present

## 2016-09-03 DIAGNOSIS — R350 Frequency of micturition: Secondary | ICD-10-CM | POA: Diagnosis not present

## 2016-09-03 DIAGNOSIS — J069 Acute upper respiratory infection, unspecified: Secondary | ICD-10-CM | POA: Diagnosis not present

## 2016-09-03 DIAGNOSIS — N8110 Cystocele, unspecified: Secondary | ICD-10-CM | POA: Diagnosis not present

## 2016-09-07 DIAGNOSIS — R1033 Periumbilical pain: Secondary | ICD-10-CM | POA: Diagnosis not present

## 2016-09-07 DIAGNOSIS — R350 Frequency of micturition: Secondary | ICD-10-CM | POA: Diagnosis not present

## 2016-09-07 DIAGNOSIS — N3941 Urge incontinence: Secondary | ICD-10-CM | POA: Diagnosis not present

## 2016-09-13 DIAGNOSIS — Z85820 Personal history of malignant melanoma of skin: Secondary | ICD-10-CM | POA: Diagnosis not present

## 2016-09-13 DIAGNOSIS — L814 Other melanin hyperpigmentation: Secondary | ICD-10-CM | POA: Diagnosis not present

## 2016-09-13 DIAGNOSIS — L820 Inflamed seborrheic keratosis: Secondary | ICD-10-CM | POA: Diagnosis not present

## 2016-09-13 DIAGNOSIS — D225 Melanocytic nevi of trunk: Secondary | ICD-10-CM | POA: Diagnosis not present

## 2016-09-13 DIAGNOSIS — L821 Other seborrheic keratosis: Secondary | ICD-10-CM | POA: Diagnosis not present

## 2016-09-18 DIAGNOSIS — H5201 Hypermetropia, right eye: Secondary | ICD-10-CM | POA: Diagnosis not present

## 2016-09-18 DIAGNOSIS — Z961 Presence of intraocular lens: Secondary | ICD-10-CM | POA: Diagnosis not present

## 2016-11-01 DIAGNOSIS — R69 Illness, unspecified: Secondary | ICD-10-CM | POA: Diagnosis not present

## 2016-11-14 DIAGNOSIS — M859 Disorder of bone density and structure, unspecified: Secondary | ICD-10-CM | POA: Diagnosis not present

## 2016-11-14 DIAGNOSIS — E784 Other hyperlipidemia: Secondary | ICD-10-CM | POA: Diagnosis not present

## 2016-11-14 DIAGNOSIS — I10 Essential (primary) hypertension: Secondary | ICD-10-CM | POA: Diagnosis not present

## 2016-11-14 DIAGNOSIS — E038 Other specified hypothyroidism: Secondary | ICD-10-CM | POA: Diagnosis not present

## 2016-11-21 DIAGNOSIS — N8110 Cystocele, unspecified: Secondary | ICD-10-CM | POA: Diagnosis not present

## 2016-11-21 DIAGNOSIS — C439 Malignant melanoma of skin, unspecified: Secondary | ICD-10-CM | POA: Diagnosis not present

## 2016-11-21 DIAGNOSIS — N644 Mastodynia: Secondary | ICD-10-CM | POA: Diagnosis not present

## 2016-11-21 DIAGNOSIS — R102 Pelvic and perineal pain: Secondary | ICD-10-CM | POA: Diagnosis not present

## 2016-11-21 DIAGNOSIS — C3490 Malignant neoplasm of unspecified part of unspecified bronchus or lung: Secondary | ICD-10-CM | POA: Diagnosis not present

## 2016-11-21 DIAGNOSIS — C50919 Malignant neoplasm of unspecified site of unspecified female breast: Secondary | ICD-10-CM | POA: Diagnosis not present

## 2016-11-21 DIAGNOSIS — R350 Frequency of micturition: Secondary | ICD-10-CM | POA: Diagnosis not present

## 2016-11-21 DIAGNOSIS — Z0000 Encounter for general adult medical examination without abnormal findings: Secondary | ICD-10-CM | POA: Diagnosis not present

## 2016-11-21 DIAGNOSIS — M859 Disorder of bone density and structure, unspecified: Secondary | ICD-10-CM | POA: Diagnosis not present

## 2016-11-21 DIAGNOSIS — E038 Other specified hypothyroidism: Secondary | ICD-10-CM | POA: Diagnosis not present

## 2016-11-22 NOTE — Progress Notes (Signed)
CLINIC:  Survivorship   REASON FOR VISIT:  Routine follow-up for history of breast cancer.   BRIEF ONCOLOGIC HISTORY:  Oncology History   Breast cancer (Right breast)   Staging form: Breast, AJCC 7th Edition     Pathologic stage from 10/17/2014: Stage IA (T1b, N0, cM0) - Signed by [name], MD on 10/17/2014 Lung cancer (LLL)   Staging form: Lung, AJCC 7th Edition     Clinical stage from 12/14/2014: Stage IA (T1a, N0, M0) - Unsigned       Breast cancer of upper-outer quadrant of right female breast ([place])   05/04/2014 Initial Diagnosis    Breast cancer      05/19/2014 Imaging    Diagnostic mammogram and ultrasound showed a 7 mm irregular suspicious mass located in the right breast at the 10:00 position, 7 cm from the nipple. Otherwise negative.      05/19/2014 Initial Biopsy    Right breast biopsy (UOQ): IDC, grade 2.       05/19/2014 Receptors her2    ER 100% positive, PR 35% positive, HER-2 negative, Ki67 8%      06/07/2014 Miscellaneous    Genetic testing normal.  APC, ATM, BARD1, BMPR1A, BRCA1, BRCA2, BRIP1, CDH1, CDK4, CDKN2A, CHEK2, EPCAM, GREM1, MLH1, MRE11A, MSH2, MSH6, MUTYH, NBN, NF1, PALB2, PMS2, POLD1, POLE, PTEN, RAD50, RAD51D, SMAD4, SMARCA4, STK11, and TP53 tested.       07/23/2014 Surgery    Right breast lumpectomy with SLNB Lucia Gaskins) showed invasive ductal carcinoma & DCIS; negative surgical margins.      07/23/2014 Pathology Results    Invasive ductal carcinoma, tumor size 0.8 cm, grade 1, no lymphovascular invasion, margins were negative, 2 sentinel lymph nodes were negative. (+) DCIS.      07/23/2014 Pathologic Stage    pT1b, pN0: Stage IA       09/16/2014 - 10/07/2014 Radiation Therapy    XRT completed Pablo Ledger). Right breast/ 42.72 Gy at 2.67 Gy per fraction x 21 fractions.       01/2015 -  Anti-estrogen oral therapy    Aromasin prescribed and recommended.  Patient declines anti-estrogen therapy at this time d/t high co-pay of medication and  possible side effects of AI therapy.       05/02/2015 Mammogram    Diagnostic TOMO bilat mammogram: No evidence of malignancy in either breast. Lumpectomy changes in right breast. Diagnostic mammo suggested in 1 year.        Primary cancer of left lower lobe of lung ([place])   10/25/2013 Imaging    CXR done for stroke protocol. No acute chest findings. Chronic lung disease with biapical scarring. Developing nodule in (L) apex cannot be excluded based on exam, athough may be d/t overlap of osseous structures.       10/27/2013 Imaging    CXR: Nodular opacity in mid (L) apex and appears more prominent than prior study. Area that appeared masslike in LUL near apex on recent CXR not seen at this time.  Underlying emphysema present. No edema or consolidation      11/12/2013 Imaging    CT chest: At left lung apex, there is asymmetric pleural parenchymal scarring. No discrete lesion.  In superior segment of LLL, there are 1 dominant ground-glass opacity measuring 16 mm. 2 addt'l ground-glass opacities appreciated. F/U with CT in 3 months      02/11/2014 Imaging    CT chest: Several small bilat faint nodular densities, largest measures 1.3 cm over superior segment LLL. No new nodules, no  adenopathy. Cannot exclude metastatic disease given pt's h/o melanoma. Recommend PET-CT      02/23/2014 PET scan    Persistent semi-solid nodule in superior segment LLL with low metabolic activity. Moderately metabolic subcarinal & hilar LN are likely reactive.       05/25/2014 Imaging    CT chest: Persistent & similar size dominant LLL sub-solid nodule. Adenocarcinoma is considered. Biopsy or resection strongly considered. Additional scattered small ground-glass nodules stable.       06/02/2014 Imaging    MRI brain: Grossly stable appearance of left planum sphenoidale meningioma measuring 20 x 14 mm. Meningioma does not extend to left orbital apex & contacts left optic nerve. Stable right posterior clinoid  meningioma .       09/27/2014 Imaging    CT chest: Further enlargment of central solid component in sub-solid LLL pulm nodule. Findings remain concerning for adenoca. Additional scattered small bilat ground-glass nodules and biapical scarring stable. No adenopathy.        11/25/2014 Surgery    Video bronchoscopy with endobronchial ultrasound; Video assisted thoracoscopy; Thoracoscopic LLL superior segmentectomy; Mediastinal lymph node dissection; Cyro intercostal nerve block Roxan Hockey)      11/25/2014 Pathology Results    Superior segment lung resection (LLL). Adenocarcinoma, well-diff, spanning 1.5 cm. Surgical margins negative. 5 LNs negative.       11/25/2014 Pathologic Stage    pT1a, pN0: Stage IA       11/25/2014 Initial Diagnosis    Primary cancer of left lower lobe of lung ([place])      05/31/2015 Imaging    CT chest: No evidence of local recurrent at superior segmentectomy in LLL. New small nodular focus of consolidation surrounding thin parenchymal band. Additional sub-cm ground-glass and solid nodules favored to be benign. No thoracic adenopathy      08/11/2015 Imaging    CT chest: No evidence of residual or recurrent tumor in LLL. Stable small nodular focus in LLL with thin surrounding parenchymal band, favored to be benign.  Multiple small ground-glass nodules unchanged.         INTERVAL HISTORY:  Melissa Snyder presents to the Survivorship Clinic today for routine follow-up for her history of breast cancer.  Overall, she reports feeling quite well. She saw Dr. Roxan Hockey and underwent CT chest in 08/2016 which was stable.  She is doing well, her mammo in 06/2016 was normal as well.  She sees her PCP regularly, and exercises regularly.      REVIEW OF SYSTEMS:  Review of Systems  Constitutional: Negative for appetite change, chills, diaphoresis, fatigue, fever and unexpected weight change.  HENT:   Negative for hearing loss and lump/mass.   Eyes: Negative for eye problems and  icterus.  Respiratory: Negative for chest tightness, cough and shortness of breath.   Cardiovascular: Negative for chest pain, leg swelling and palpitations.  Gastrointestinal: Negative for abdominal distention, abdominal pain, constipation, diarrhea and vomiting.  Endocrine: Negative for hot flashes.  Genitourinary: Negative for difficulty urinating.   Musculoskeletal: Negative for arthralgias.  Skin: Negative for itching and rash.  Neurological: Negative for dizziness, extremity weakness and headaches.  Hematological: Negative for adenopathy. Does not bruise/bleed easily.  Psychiatric/Behavioral: Negative for depression. The patient is not nervous/anxious.   Breast: Denies any new nodularity, masses, tenderness, nipple changes, or nipple discharge.       PAST MEDICAL/SURGICAL HISTORY:  Past Medical History:  Diagnosis Date  . Blind left eye 10/2013   cva  . Breast cancer ([place]) 05/2014   ER+/PR+/Her2-  .  Bundle branch block left   . Chest pain   . Family history of bladder cancer   . Family history of breast cancer   . Family history of prostate cancer   . GERD (gastroesophageal reflux disease)   . History of blood transfusion    age 66  . Hyperlipidemia   . Hypertension   . Hypothyroid   . Lung nodule   . Melanoma in situ of face ([place]) JUNE 2015   LEFT CHEEK  . Meningioma ([place])    brain lining  . Migraine    reports only having one  . Osteoarthritis   . Radiation 09/16/14-10/07/14   Right  Breast  21 fractions  . Skin cancer   . Stroke [facility]) 2015   Blind in left eye as a result   Past Surgical History:  Procedure Laterality Date  . ABDOMINAL HYSTERECTOMY  ~ 1997  . APPENDECTOMY     age 11  . BILATERAL SALPINGOOPHORECTOMY    . BLADDER REPAIR  2009   cystocele  . BUNIONECTOMY     bilateral great toe  . CARDIAC CATHETERIZATION  2004  . CHOLECYSTECTOMY  ~ 1997  . COLONOSCOPY    . CRYO INTERCOSTAL NERVE BLOCK Left 11/25/2014   Procedure: CRYO INTERCOSTAL NERVE  BLOCK;  Surgeon: [name], MD;  Location: [facility];  Service: Thoracic;  Laterality: Left;  . CT RADIATION THERAPY GUIDE     Gamma radiation -lt frontal-[facility]  . DILATION AND CURETTAGE OF UTERUS     X 2  . MELANOMA EXCISION Left 11/05/23, 11/30/13   CHEEK  . OTHER SURGICAL HISTORY     Gamma knife to Meningioma in brain lining  . SEGMENTECOMY Left 11/25/2014   Procedure: LEFT LOWER LOBE SEGMENTECTOMY;  Surgeon: [name], MD;  Location: [facility];  Service: Thoracic;  Laterality: Left;  Marland Kitchen VIDEO ASSISTED THORACOSCOPY Left 11/25/2014   Procedure: VIDEO ASSISTED THORACOSCOPY;  Surgeon: [name], MD;  Location: [facility];  Service: Thoracic;  Laterality: Left;  Marland Kitchen VIDEO BRONCHOSCOPY WITH ENDOBRONCHIAL ULTRASOUND N/A 11/25/2014   Procedure: VIDEO BRONCHOSCOPY WITH ENDOBRONCHIAL ULTRASOUND;  Surgeon: [name], MD;  Location: [facility];  Service: Thoracic;  Laterality: N/A;     ALLERGIES:  Allergies  Allergen Reactions  . Beef-Derived Products     ACID REFLUX  . Chocolate     Acid Reflux  . Hydrocodone Other (See Comments)    Couldn't sleep  . Other     FRIED FOODS  . Tramadol Itching  . Ibuprofen Rash and Other (See Comments)    Causes mouth sores  . Niacin And Related Other (See Comments)    Flushing, burning, redness     CURRENT MEDICATIONS:  Outpatient Encounter Prescriptions as of 11/23/2016  Medication Sig Note  . alendronate (FOSAMAX) 70 MG tablet Take 70 mg by mouth once a week. Pt takes medication before breakfast with full glass of water. Pt sits upright for 30 minutes after taking medicine. Do not  lie down or recline for at least 30 minutes after taking medication   . aspirin 81 MG chewable tablet Chew 1 tablet (81 mg total) by mouth daily.   Marland Kitchen atenolol (TENORMIN) 50 MG tablet Take 50 mg by mouth at bedtime.    Marland Kitchen b complex vitamins capsule Take 1 capsule by mouth daily. 11/22/2014: ISOTONIC  . calcium-vitamin D (OSCAL WITH D) 500-200 MG-UNIT  per tablet Take 1 tablet by mouth every morning. 11/22/2014: ISOTONIC  . Cholecalciferol (VITAMIN D)  2000 units tablet Take 2,000 Units by mouth daily.   . fish oil-omega-3 fatty acids 1000 MG capsule Take 1 g by mouth every morning.    Marland Kitchen levothyroxine (SYNTHROID, LEVOTHROID) 112 MCG tablet Take 112 mcg by mouth every morning.    Marland Kitchen lisinopril (PRINIVIL,ZESTRIL) 10 MG tablet Take 1 tablet (10 mg total) by mouth daily. (Patient taking differently: Take 10 mg by mouth 2 (two) times daily. )   . Multiple Vitamin (MULTIVITAMIN) tablet Take 1 tablet by mouth every morning.  11/22/2014: ISOTONIC  . Multiple Vitamins-Minerals (VISION FORMULA PO) Take 1 capsule by mouth daily. 11/22/2014: ISOTONIC  . vitamin E (E-400) 400 UNIT capsule Take 400 Units by mouth 3 (three) times a week. Mon Wed Fri   . [DISCONTINUED] ibandronate (BONIVA) 150 MG tablet Take 150 mg by mouth every 30 (thirty) days. Take in the morning with a full glass of water, on an empty stomach, and do not take anything else by mouth or lie down for the next 30 min.    No facility-administered encounter medications on file as of 11/23/2016.      ONCOLOGIC FAMILY HISTORY:  Family History  Problem Relation Age of Onset  . CAD Mother        CABG  . Hyperlipidemia Mother   . AAA (abdominal aortic aneurysm) Mother   . Renal Disease Father   . Kidney disease Father   . CAD Father   . Cancer Father        PROSTATE  . Diabetes Brother   . Hyperlipidemia Brother   . Breast cancer Daughter 83  . Breast cancer Sister 27  . Breast cancer Sister 97  . Cancer Brother        NOS  . Bladder Cancer Brother 20  . Breast cancer Cousin        five paternal first cousins  . Lung cancer Cousin        3 paternal cousins with lung cancer  . Cancer Cousin        4 paternal first cousins with Cancer NOS  . Cancer Cousin        1 paternal cousin with oral cancer (tongue)     SOCIAL HISTORY:  CATHLENE GARDELLA is married and lives with her husband in  [place], [place].  Ms. Muhs is currently retired.  She denies any current or history of tobacco, alcohol, or illicit drug use.     PHYSICAL EXAMINATION:  Vital Signs: Vitals:   11/23/16 1407  BP: (!) 168/64  Pulse: 72  Resp: 20  Temp: 97.6 F (36.4 C)   Filed Weights   11/23/16 1407  Weight: 134 lb 3.2 oz (60.9 kg)   General: Well-nourished, well-appearing female in no acute distress.  Unaccompanied today.   HEENT: Head is normocephalic.  Pupils equal and reactive to light. Conjunctivae clear without exudate.  Sclerae anicteric. Oral mucosa is pink, moist.  Oropharynx is pink without lesions or erythema.  Lymph: No cervical, supraclavicular, or infraclavicular lymphadenopathy noted on palpation.  Cardiovascular: Regular rate and rhythm.Marland Kitchen Respiratory: Clear to auscultation bilaterally. Chest expansion symmetric; breathing non-labored.  Breast Exam:  -Left breast: No appreciable masses on palpation. No skin redness, thickening, or peau d'orange appearance; no nipple retraction or nipple discharge;   -Right breast: No appreciable masses on palpation. No skin redness, thickening, or peau d'orange appearance; no nipple retraction or nipple discharge; mild distortion in symmetry at previous lumpectomy site well healed scar without erythema or nodularity. -Axilla: No  axillary adenopathy bilaterally.  GI: Abdomen soft and round; non-tender, non-distended. Bowel sounds normoactive. No hepatosplenomegaly.   GU: Deferred.  Neuro: No focal deficits. Steady gait.  Psych: Mood and affect normal and appropriate for situation.  MSK: No focal spinal tenderness to palpation, full range of motion in bilateral upper extremities Extremities: No edema. Skin: Warm and dry.  LABORATORY DATA:  None for this visit   DIAGNOSTIC IMAGING:  Most recent mammogram:        ASSESSMENT AND PLAN:  Ms.. Hebel is a pleasant 83 y.o. female with history of Stage IA right breast invasive ductal  carcinoma, ER+/PR+/HER2-, diagnosed in 05/2014, treated with lumpectomy, adjuvant radiation therapy, declined Aromasin.  She also has h/o stage IA left lower lobe adenocarcinoma s/p lung resection.  She presents to the Survivorship Clinic for surveillance and routine follow-up.   1. History of breast cancer:  Ms. Ambrosius is currently clinically and radiographically without evidence of disease or recurrence of breast cancer. She will be due for mammogram in 06/2017; orders placed today.  I encouraged her to call me with any questions or concerns before her next visit at the cancer center, and I would be happy to see her sooner, if needed.    2. History of lung cancer: She is currently clinically and radiographically without any evidence of disease recurrence or lung cancer.  She does see Dr. Roxan Hockey and her last CT chest in 08/2016 was stable.    3. Bone health:  Given Ms. Jeschke's age, history of breast cancer she is at risk for bone demineralization.  She was given education on specific food and activities to promote bone health.  4. Cancer screening:  Due to Ms. Frosch's history and her age, she should receive screening for skin cancers.  She was encouraged to follow-up with her PCP for appropriate cancer screenings.   5. Health maintenance and wellness promotion: Ms. Mokry was encouraged to consume 5-7 servings of fruits and vegetables per day. She was also encouraged to engage in moderate to vigorous exercise for 30 minutes per day most days of the week. She was instructed to limit her alcohol consumption and continue to abstain from tobacco use.    Dispo:  -Return to cancer center in one year for LTS follow up -Mammogram in 06/2016    A total of (30) minutes of face-to-face time was spent with this patient with greater than 50% of that time in counseling and care-coordination.   Gardenia Phlegm, NP Survivorship Program [facility] [phone]   Note: PRIMARY CARE  PROVIDER [name], [place] [phone]

## 2016-11-23 ENCOUNTER — Encounter: Payer: Self-pay | Admitting: Adult Health

## 2016-11-23 ENCOUNTER — Ambulatory Visit (HOSPITAL_BASED_OUTPATIENT_CLINIC_OR_DEPARTMENT_OTHER): Payer: Medicare HMO | Admitting: Adult Health

## 2016-11-23 ENCOUNTER — Encounter: Payer: Medicare HMO | Admitting: Adult Health

## 2016-11-23 VITALS — BP 168/64 | HR 72 | Temp 97.6°F | Resp 20 | Ht 66.0 in | Wt 134.2 lb

## 2016-11-23 DIAGNOSIS — Z85118 Personal history of other malignant neoplasm of bronchus and lung: Secondary | ICD-10-CM | POA: Diagnosis not present

## 2016-11-23 DIAGNOSIS — C3432 Malignant neoplasm of lower lobe, left bronchus or lung: Secondary | ICD-10-CM

## 2016-11-23 DIAGNOSIS — C50411 Malignant neoplasm of upper-outer quadrant of right female breast: Secondary | ICD-10-CM

## 2016-11-23 DIAGNOSIS — Z853 Personal history of malignant neoplasm of breast: Secondary | ICD-10-CM | POA: Diagnosis not present

## 2016-11-23 DIAGNOSIS — Z170 Estrogen receptor positive status [ER+]: Secondary | ICD-10-CM

## 2016-11-29 DIAGNOSIS — Z1211 Encounter for screening for malignant neoplasm of colon: Secondary | ICD-10-CM | POA: Diagnosis not present

## 2016-11-29 DIAGNOSIS — Z1212 Encounter for screening for malignant neoplasm of rectum: Secondary | ICD-10-CM | POA: Diagnosis not present

## 2017-01-11 ENCOUNTER — Other Ambulatory Visit: Payer: Self-pay | Admitting: *Deleted

## 2017-01-11 DIAGNOSIS — Z85118 Personal history of other malignant neoplasm of bronchus and lung: Secondary | ICD-10-CM

## 2017-01-16 DIAGNOSIS — E038 Other specified hypothyroidism: Secondary | ICD-10-CM | POA: Diagnosis not present

## 2017-02-19 ENCOUNTER — Other Ambulatory Visit: Payer: Self-pay | Admitting: *Deleted

## 2017-02-19 ENCOUNTER — Ambulatory Visit
Admission: RE | Admit: 2017-02-19 | Discharge: 2017-02-19 | Disposition: A | Discharge status: Home / Self Care | Payer: Medicare HMO | Source: Ambulatory Visit | Attending: Thoracic Surgery (Cardiothoracic Vascular Surgery) | Admitting: Thoracic Surgery (Cardiothoracic Vascular Surgery)

## 2017-02-19 ENCOUNTER — Ambulatory Visit (INDEPENDENT_AMBULATORY_CARE_PROVIDER_SITE_OTHER): Payer: Medicare HMO | Admitting: Thoracic Surgery (Cardiothoracic Vascular Surgery)

## 2017-02-19 ENCOUNTER — Encounter: Payer: Self-pay | Admitting: Thoracic Surgery (Cardiothoracic Vascular Surgery)

## 2017-02-19 VITALS — BP 175/80 | HR 64 | Resp 18 | Ht 66.0 in | Wt 135.0 lb

## 2017-02-19 DIAGNOSIS — R918 Other nonspecific abnormal finding of lung field: Secondary | ICD-10-CM | POA: Diagnosis not present

## 2017-02-19 DIAGNOSIS — Z85118 Personal history of other malignant neoplasm of bronchus and lung: Secondary | ICD-10-CM

## 2017-02-19 DIAGNOSIS — C3432 Malignant neoplasm of lower lobe, left bronchus or lung: Secondary | ICD-10-CM | POA: Diagnosis not present

## 2017-02-19 NOTE — Progress Notes (Signed)
[address]       [place] [address]             [phone]     HPI: Melissa Snyder returns for her 2 year follow-up  Melissa Snyder is an 83 year old woman who had a left lower lobe superior segmentectomy for stage IA non-small cell carcinoma in June 2016. We have followed her with CT scan since then. She has multiple groundglass opacities on her CT. I last saw her in the office in March 2018. She was doing well at that time. Her lung nodules were unchanged on CT.  In the interim since her last visit she has been dealing with emotional distress after her daughter was diagnosed with lung cancer involving the lymph nodes. She will be undergoing chemotherapy and radiation. She noticed a firm lump in her right sternoclavicular joint area a couple of months ago. It has not changed, but she is concerned about that.  Past Medical History:  Diagnosis Date  . Blind left eye 10/2013   cva  . Breast cancer ([place]) 05/2014   ER+/PR+/Her2-  . Bundle branch block left   . Chest pain   . Family history of bladder cancer   . Family history of breast cancer   . Family history of prostate cancer   . GERD (gastroesophageal reflux disease)   . History of blood transfusion    age 28  . Hyperlipidemia   . Hypertension   . Hypothyroid   . Lung nodule   . Melanoma in situ of face ([place]) JUNE 2015   LEFT CHEEK  . Meningioma ([place])    brain lining  . Migraine    reports only having one  . Osteoarthritis   . Radiation 09/16/14-10/07/14   Right  Breast  21 fractions  . Skin cancer   . Stroke [facility]) 2015   Blind in left eye as a result    Current Outpatient Prescriptions  Medication Sig Dispense Refill  . alendronate (FOSAMAX) 70 MG tablet Take 70 mg by mouth once a week. Pt takes medication before breakfast with full glass of water. Pt sits upright for 30 minutes after taking medicine. Do not  lie down or recline for at least 30 minutes after taking medication    . aspirin 81 MG  chewable tablet Chew 1 tablet (81 mg total) by mouth daily. 30 tablet 0  . atenolol (TENORMIN) 50 MG tablet Take 50 mg by mouth at bedtime.     Marland Kitchen b complex vitamins capsule Take 1 capsule by mouth daily.    . calcium-vitamin D (OSCAL WITH D) 500-200 MG-UNIT per tablet Take 1 tablet by mouth every morning.    . Cholecalciferol (VITAMIN D) 2000 units tablet Take 2,000 Units by mouth daily.    . fish oil-omega-3 fatty acids 1000 MG capsule Take 1 g by mouth every morning.     Marland Kitchen levothyroxine (SYNTHROID, LEVOTHROID) 112 MCG tablet Take 112 mcg by mouth every morning.     Marland Kitchen lisinopril (PRINIVIL,ZESTRIL) 10 MG tablet Take 1 tablet (10 mg total) by mouth daily. (Patient taking differently: Take 10 mg by mouth 2 (two) times daily. ) 30 tablet 1  . Multiple Vitamin (MULTIVITAMIN) tablet Take 1 tablet by mouth every morning.     . Multiple Vitamins-Minerals (VISION FORMULA PO) Take 1 capsule by mouth daily.    . vitamin E (E-400) 400 UNIT capsule Take 400 Units by mouth 3 (three) times a week. Mon  Wed Fri     No current facility-administered medications for this visit.     Physical Exam BP (!) 175/80   Pulse 64   Resp 18   Ht '5\' 6"'  (1.676 m)   Wt 135 lb (61.2 kg)   SpO2 99% Comment: RA  BMI 21.36 kg/m  83 year old woman in no acute distress Alert and oriented 3 with no focal deficits No cervical or subclavicular adenopathy Bony prominence head of right clavicle Cardiac regular rate and rhythm Lungs diminished but otherwise clear bilaterally  Diagnostic Tests:   CT CHEST WITHOUT CONTRAST  TECHNIQUE: Multidetector CT imaging of the chest was performed following the standard protocol without IV contrast.  COMPARISON:  08/14/2016 chest CT.  FINDINGS: Cardiovascular: Normal heart size. No significant pericardial fluid/thickening. Left anterior descending, left circumflex and right coronary atherosclerosis. Atherosclerotic nonaneurysmal thoracic aorta. Normal caliber pulmonary  arteries.  Mediastinum/Nodes: No discrete thyroid nodules. Unremarkable esophagus. No pathologically enlarged axillary, mediastinal or gross hilar lymph nodes, noting limited sensitivity for the detection of hilar adenopathy on this noncontrast study.  Lungs/Pleura: No pneumothorax. No pleural effusion. Status post superior segmentectomy left lower lobe. There is an irregular 1.7 x 0.9 cm solid pulmonary nodule in the anterior left lower lobe adjacent to the suture line (series 4/ image 42), increased from 1.4 x 0.7 cm on 08/14/2016 and 0.9 x 0.5 cm on 02/07/2016. Stable parenchymal band in the posterior basilar left lower lobe. Stable mild patchy tree-in-bud opacities in basilar right middle lobe with associated mild cylindrical and varicoid bronchiectasis in the basilar right middle lobe and inferior segment lingula. Numerous small ground-glass pulmonary nodules in the upper lungs bilaterally measuring up to 9 mm in the left upper lobe (series 4/image 22), unchanged. Stable patchy subpleural reticulation in the anterior right upper lobe compatible with mild radiation fibrosis.  Upper abdomen: Cholecystectomy.  Musculoskeletal: No aggressive appearing focal osseous lesions. Moderate thoracic spondylosis. Stable T12 vertebral hemangioma.  IMPRESSION: 1. Enlarging irregular 1.7 x 0.9 cm solid pulmonary nodule in the anterior left lower lobe adjacent to the segmentectomy suture line, cannot exclude local tumor recurrence. Consider further characterization with PET-CT. 2. Otherwise no findings suspicious for metastatic disease in the chest. 3. Small ground-glass pulmonary nodules in the upper lobes are all stable. 4. Coronary atherosclerosis.  Aortic Atherosclerosis (ICD10-I70.0).  I personally reviewed the CT chest did not see any change in the lung nodules. There is a bone spur of the head of the right clavicle. That was also present on her scan in  March.  Impression: 83 year old woman who is now 2 years out from a segmentectomy for a stage IA non-small cell carcinoma. Radiology is concerned about the irregular opacity near her suture line. They feel it has enlarged. I'm not sure that it significantly different and it would be a highly unusual appearance for lung cancer. We'll get a PET CT to further evaluate.  Multiple lung nodules and apical scarring- appear stable by CT. We'll plan to re-Peter scan in 6 months  Hypertension- blood pressure markedly elevated at 175/80 today. She is not concerned about this finding. She is on lisinopril. I recommended that she check herself on a regular basis and if her pressure remains elevated she should discuss with Dr. Joylene Draft  Plan: PET/CT to further evaluate left lower lobe abnormality  [name], MD [facility] [phone]

## 2017-02-21 ENCOUNTER — Other Ambulatory Visit: Payer: Self-pay | Admitting: *Deleted

## 2017-02-21 DIAGNOSIS — R911 Solitary pulmonary nodule: Secondary | ICD-10-CM

## 2017-02-28 DIAGNOSIS — Z85820 Personal history of malignant melanoma of skin: Secondary | ICD-10-CM | POA: Diagnosis not present

## 2017-02-28 DIAGNOSIS — L814 Other melanin hyperpigmentation: Secondary | ICD-10-CM | POA: Diagnosis not present

## 2017-02-28 DIAGNOSIS — L821 Other seborrheic keratosis: Secondary | ICD-10-CM | POA: Diagnosis not present

## 2017-02-28 DIAGNOSIS — D229 Melanocytic nevi, unspecified: Secondary | ICD-10-CM | POA: Diagnosis not present

## 2017-03-04 ENCOUNTER — Ambulatory Visit (HOSPITAL_COMMUNITY)
Admission: RE | Admit: 2017-03-04 | Discharge: 2017-03-04 | Disposition: A | Discharge status: Home / Self Care | Payer: Medicare HMO | Source: Ambulatory Visit | Attending: Thoracic Surgery (Cardiothoracic Vascular Surgery) | Admitting: Thoracic Surgery (Cardiothoracic Vascular Surgery)

## 2017-03-04 DIAGNOSIS — J323 Chronic sphenoidal sinusitis: Secondary | ICD-10-CM | POA: Diagnosis not present

## 2017-03-04 DIAGNOSIS — I700 Atherosclerosis of aorta: Secondary | ICD-10-CM | POA: Diagnosis not present

## 2017-03-04 DIAGNOSIS — I2510 Atherosclerotic heart disease of native coronary artery without angina pectoris: Secondary | ICD-10-CM | POA: Insufficient documentation

## 2017-03-04 DIAGNOSIS — R911 Solitary pulmonary nodule: Secondary | ICD-10-CM | POA: Diagnosis not present

## 2017-03-04 DIAGNOSIS — K5730 Diverticulosis of large intestine without perforation or abscess without bleeding: Secondary | ICD-10-CM | POA: Insufficient documentation

## 2017-03-04 DIAGNOSIS — C3490 Malignant neoplasm of unspecified part of unspecified bronchus or lung: Secondary | ICD-10-CM | POA: Diagnosis not present

## 2017-03-04 LAB — GLUCOSE, CAPILLARY: Glucose-Capillary: 107 mg/dL — ABNORMAL HIGH (ref 65–99)

## 2017-03-04 MED ORDER — FLUDEOXYGLUCOSE F - 18 (FDG) INJECTION: 6.6000 | Freq: Once | INTRAVENOUS | Status: DC | PRN

## 2017-03-06 DIAGNOSIS — C50911 Malignant neoplasm of unspecified site of right female breast: Secondary | ICD-10-CM | POA: Diagnosis not present

## 2017-03-06 DIAGNOSIS — C3432 Malignant neoplasm of lower lobe, left bronchus or lung: Secondary | ICD-10-CM | POA: Diagnosis not present

## 2017-03-06 DIAGNOSIS — H5440 Blindness, one eye, unspecified eye: Secondary | ICD-10-CM | POA: Diagnosis not present

## 2017-03-08 DIAGNOSIS — R69 Illness, unspecified: Secondary | ICD-10-CM | POA: Diagnosis not present

## 2017-03-12 ENCOUNTER — Encounter: Payer: Self-pay | Admitting: Thoracic Surgery (Cardiothoracic Vascular Surgery)

## 2017-03-12 ENCOUNTER — Ambulatory Visit (INDEPENDENT_AMBULATORY_CARE_PROVIDER_SITE_OTHER): Payer: Medicare HMO | Admitting: Thoracic Surgery (Cardiothoracic Vascular Surgery)

## 2017-03-12 VITALS — BP 177/87 | HR 70 | Wt 135.0 lb

## 2017-03-12 DIAGNOSIS — C3492 Malignant neoplasm of unspecified part of left bronchus or lung: Secondary | ICD-10-CM

## 2017-03-12 NOTE — Progress Notes (Signed)
[address]       [place] [address]             [phone]       HPI: Melissa Snyder returns to discuss the results for PET/CT   Melissa Snyder is an 83 year old woman who had a left lower lobe superior segmentectomy for stage IA non-small cell carcinoma in June 2016. She's been followed with CT scans since then. I saw her in September and her PET/CT showed an increase in soft tissue density around her previous staple line. Radiology recommended a PET CT to further evaluate the area.  Past Medical History:  Diagnosis Date  . Blind left eye 10/2013   cva  . Breast cancer ([place]) 05/2014   ER+/PR+/Her2-  . Bundle branch block left   . Chest pain   . Family history of bladder cancer   . Family history of breast cancer   . Family history of prostate cancer   . GERD (gastroesophageal reflux disease)   . History of blood transfusion    age 40  . Hyperlipidemia   . Hypertension   . Hypothyroid   . Lung nodule   . Melanoma in situ of face ([place]) JUNE 2015   LEFT CHEEK  . Meningioma ([place])    brain lining  . Migraine    reports only having one  . Osteoarthritis   . Radiation 09/16/14-10/07/14   Right  Breast  21 fractions  . Skin cancer   . Stroke [facility]) 2015   Blind in left eye as a result     Current Outpatient Prescriptions  Medication Sig Dispense Refill  . alendronate (FOSAMAX) 70 MG tablet Take 70 mg by mouth once a week. Pt takes medication before breakfast with full glass of water. Pt sits upright for 30 minutes after taking medicine. Do not  lie down or recline for at least 30 minutes after taking medication    . aspirin 81 MG chewable tablet Chew 1 tablet (81 mg total) by mouth daily. 30 tablet 0  . atenolol (TENORMIN) 50 MG tablet Take 50 mg by mouth at bedtime.     Marland Kitchen b complex vitamins capsule Take 1 capsule by mouth daily.    . calcium-vitamin D (OSCAL WITH D) 500-200 MG-UNIT per tablet Take 1 tablet by mouth every morning.    . Cholecalciferol  (VITAMIN D) 2000 units tablet Take 2,000 Units by mouth daily.    . fish oil-omega-3 fatty acids 1000 MG capsule Take 1 g by mouth every morning.     Marland Kitchen levothyroxine (SYNTHROID, LEVOTHROID) 112 MCG tablet Take 112 mcg by mouth every morning.     Marland Kitchen lisinopril (PRINIVIL,ZESTRIL) 10 MG tablet Take 1 tablet (10 mg total) by mouth daily. (Patient taking differently: Take 10 mg by mouth 2 (two) times daily. ) 30 tablet 1  . Multiple Vitamin (MULTIVITAMIN) tablet Take 1 tablet by mouth every morning.     . Multiple Vitamins-Minerals (VISION FORMULA PO) Take 1 capsule by mouth daily.    . vitamin E (E-400) 400 UNIT capsule Take 400 Units by mouth 3 (three) times a week. Mon Wed Fri     No current facility-administered medications for this visit.     Physical Exam 83 year old woman in no acute distress  Diagnostic Tests: NUCLEAR MEDICINE PET SKULL BASE TO THIGH  TECHNIQUE: 6.6 mCi F-18 FDG was injected intravenously. Full-ring PET imaging was performed from the skull base to thigh after the radiotracer.  CT data was obtained and used for attenuation correction and anatomic localization.  FASTING BLOOD GLUCOSE:  Value: 107 mg/dl  COMPARISON:  02/23/2014 and chest CT from 02/19/2017  FINDINGS: NECK  Chronic right maxillary and sphenoid sinusitis. Cervical spondylosis. No significant abnormal activity is identified in the neck to suggest malignancy.  CHEST  In the area of concern along the left lower lobe adjacent to the segmentectomy site, there is accentuated metabolic activity with maximum SUV.  As before, mildly hypermetabolic bilateral hilar and subcarinal lymph nodes are present. Index subcarinal node measures 0.7 cm in short axis and has maximum standard uptake value 4.4, previously 5.9. Background mediastinal blood pool activity is 2.3.  Right greater than left apical pleuroparenchymal scarring. Recent chest CT showed several ground-glass pulmonary nodules, none  of these are discernibly hypermetabolic on PET-CT. There is some nodularity along bandlike scarring in the left lower lobe which is currently not hypermetabolic, and likely benign.  Coronary, aortic arch, and branch vessel atherosclerotic vascular disease. Mitral valve calcification.  ABDOMEN/PELVIS  No abnormal hypermetabolic activity within the liver, pancreas, adrenal glands, or spleen. No hypermetabolic lymph nodes in the abdomen or pelvis.  Aortoiliac atherosclerotic vascular disease. Cholecystectomy. Small lipoma along the left external oblique musculature. Mild sigmoid colon diverticulosis. Uterus absent.  SKELETON  Activity along the left greater trochanter margin is likely inflammatory. No compelling findings of osseous metastatic disease.  IMPRESSION: 1. In the area of bandlike nodularity along the wedge resection air is accentuated metabolic activity with maximum SUV 2.6. Although relatively low-grade, this is greater than I would expect for atelectasis or scarring and suspicious for recurrent malignancy. 2. Scattered bilateral mildly hypermetabolic small lymph nodes in the hila and subcarinal region, not appreciably changed from 2015, likely due to chronic inflammatory process given the long-term stability. 3. No findings of metastatic disease to the neck, abdomen/pelvis, or skeleton. 4. Other imaging findings of potential clinical significance: Chronic right maxillary and sphenoid sinusitis. Aortic Atherosclerosis (ICD10-I70.0). Coronary atherosclerosis. Mild sigmoid colon diverticulosis.   Electronically Signed   By: Van Clines M.D.   On: 03/04/2017 09:53 I personally reviewed the PET/CT and concur with the findings noted above  Impression: Melissa Snyder is an 83 year old woman with a history of a stage IA non-small cell carcinoma back in 2016. On CT scan there has been an increase in soft tissue density around her previous staple line. This  is obviously concerning for possible recurrence, but atelectasis, infection, or inflammation are also within the differential.  On PET CT there is some mild hypermetabolic activity within SUV of 2.6. That really doesn't clarify matters other than to suggest it is not simple atelectasis.  I discussed several options with Mrs. Hutmacher. One is continued radiographic follow-up. Another would be to attempt a biopsy either percutaneously or bronchoscopically. I think this will be difficult to biopsy with either approach given its medial location and her previous surgery. The final option would be to treat with stereotactic or hypo fractionated radiation without a diagnosis. That would obviously have to be recommended by a radiation oncologist.  Plan: We'll review with radiology, interventional radiology, radiation oncology and medical oncology at our multidisciplinary conference on Thursday.  We'll call Mrs. Bushway with a recommendation after that conference  I spent 10 minutes face-to-face with Mrs. Maybee reviewing films and discussing options. [name], MD [facility] [phone]

## 2017-06-07 ENCOUNTER — Other Ambulatory Visit: Payer: Self-pay

## 2017-06-07 ENCOUNTER — Other Ambulatory Visit: Payer: Self-pay | Admitting: Thoracic Surgery (Cardiothoracic Vascular Surgery)

## 2017-06-07 ENCOUNTER — Telehealth: Payer: Self-pay

## 2017-06-07 DIAGNOSIS — C3432 Malignant neoplasm of lower lobe, left bronchus or lung: Secondary | ICD-10-CM

## 2017-06-07 NOTE — Telephone Encounter (Signed)
LMOM about appointment times next week for Dr Roxan Hockey and Chest CT Her chest CT is scheduled for Monday 06/10/2017 @ 3:50 pm and Her appointment with Dr Roxan Hockey is 06/11/2017 @ 9:30 am

## 2017-06-10 ENCOUNTER — Ambulatory Visit
Admission: RE | Admit: 2017-06-10 | Discharge: 2017-06-10 | Disposition: A | Discharge status: Home / Self Care | Payer: Medicare HMO | Source: Ambulatory Visit | Attending: Thoracic Surgery (Cardiothoracic Vascular Surgery) | Admitting: Thoracic Surgery (Cardiothoracic Vascular Surgery)

## 2017-06-10 DIAGNOSIS — R911 Solitary pulmonary nodule: Secondary | ICD-10-CM | POA: Diagnosis not present

## 2017-06-10 DIAGNOSIS — C3432 Malignant neoplasm of lower lobe, left bronchus or lung: Secondary | ICD-10-CM

## 2017-06-11 ENCOUNTER — Other Ambulatory Visit: Payer: Self-pay

## 2017-06-11 ENCOUNTER — Other Ambulatory Visit: Payer: Self-pay | Admitting: *Deleted

## 2017-06-11 ENCOUNTER — Encounter: Payer: Self-pay | Admitting: Thoracic Surgery (Cardiothoracic Vascular Surgery)

## 2017-06-11 ENCOUNTER — Ambulatory Visit: Payer: Medicare HMO | Admitting: Thoracic Surgery (Cardiothoracic Vascular Surgery)

## 2017-06-11 VITALS — BP 171/84 | HR 67 | Resp 18 | Ht 66.0 in | Wt 136.6 lb

## 2017-06-11 DIAGNOSIS — R911 Solitary pulmonary nodule: Secondary | ICD-10-CM

## 2017-06-11 DIAGNOSIS — C3432 Malignant neoplasm of lower lobe, left bronchus or lung: Secondary | ICD-10-CM | POA: Diagnosis not present

## 2017-06-11 NOTE — H&P (View-Only) (Signed)
[address]       [place] [address]             [phone]     HPI: [name] returns for a scheduled follow up visit  [name] is an 84 year old woman with a past history significant for stage IA non-small cell lung cancer of the left lower lobe, prior stroke with residual blindness left eye, hypertension, hyperlipidemia, hypothyroidism, melanoma in situ, meningioma, breast cancer, left bundle branch block, and reflux.  She had a left lower lobe superior segmentectomy for stage Ia non-small cell carcinoma in June 2016.  She did not require adjuvant therapy.  She has been followed with CTs.  In September her CT scan showed an increase in soft tissue density around her previous staple line.  A PET/CT was done to further evaluate that in October.  There was mild hypermetabolic activity.  Due to the potential difficulties in biopsy this was discussed at our multidisciplinary thoracic oncology conference.  Consensus was to continue to follow the lesion radiographically.  In the interim since her last visit she is noticed some pain under her left breast when she first gets up and starts to move around in the morning.  This then goes away and does not bother her the rest of the day.  She has not had any problems with her breathing.  Her appetite is good she has not had any significant weight loss.  Past Medical History:  Diagnosis Date  . Blind left eye 10/2013   cva  . Breast cancer ([place]) 05/2014   ER+/PR+/Her2-  . Bundle branch block left   . Chest pain   . Family history of bladder cancer   . Family history of breast cancer   . Family history of prostate cancer   . GERD (gastroesophageal reflux disease)   . History of blood transfusion    age 23  . Hyperlipidemia   . Hypertension   . Hypothyroid   . Lung nodule   . Melanoma in situ of face ([place]) JUNE 2015   LEFT CHEEK  . Meningioma ([place])    brain lining  . Migraine    reports only having one  .  Osteoarthritis   . Radiation 09/16/14-10/07/14   Right  Breast  21 fractions  . Skin cancer   . Stroke [facility]) 2015   Blind in left eye as a result    Current Outpatient Medications  Medication Sig Dispense Refill  . alendronate (FOSAMAX) 70 MG tablet Take 70 mg by mouth once a week. Pt takes medication before breakfast with full glass of water. Pt sits upright for 30 minutes after taking medicine. Do not  lie down or recline for at least 30 minutes after taking medication    . aspirin EC 81 MG tablet Take 81 mg by mouth daily.    Marland Kitchen atenolol (TENORMIN) 50 MG tablet Take 50 mg by mouth at bedtime.     Marland Kitchen b complex vitamins capsule Take 1 capsule by mouth daily.    . calcium-vitamin D (OSCAL WITH D) 500-200 MG-UNIT per tablet Take 1 tablet by mouth every morning.    . Cholecalciferol (VITAMIN D) 2000 units tablet Take 2,000 Units by mouth daily.    . fish oil-omega-3 fatty acids 1000 MG capsule Take 1 g by mouth every morning.     Marland Kitchen levothyroxine (SYNTHROID, LEVOTHROID) 112 MCG tablet Take 112 mcg by mouth every morning.     Marland Kitchen  lisinopril (PRINIVIL,ZESTRIL) 10 MG tablet Take 1 tablet (10 mg total) by mouth daily. (Patient taking differently: Take 10 mg by mouth 2 (two) times daily. ) 30 tablet 1  . Multiple Vitamin (MULTIVITAMIN) tablet Take 1 tablet by mouth every morning.     . Multiple Vitamins-Minerals (VISION FORMULA PO) Take 1 capsule by mouth daily.    . vitamin E (E-400) 400 UNIT capsule Take 400 Units by mouth 3 (three) times a week. Mon Wed Fri     No current facility-administered medications for this visit.     Physical Exam BP (!) 171/84 (BP Location: Right Arm, Patient Position: Sitting, Cuff Size: Normal)   Pulse 67   Resp 18   Ht '5\' 6"'  (1.676 m)   Wt 136 lb 9.6 oz (62 kg)   SpO2 98% Comment: RA  BMI 22.71 kg/m  84 year old woman in no acute distress Flat affect Alert and oriented x3 with no focal motor deficits No cervical or subclavicular adenopathy Cardiac regular rate  and rhythm normal S1 and S2 Lungs diminished breath sounds bilaterally, no rales or wheezing Abdomen soft nontender Extremities without clubbing cyanosis or edema  Diagnostic Tests: CT CHEST WITHOUT CONTRAST  TECHNIQUE: Multidetector CT imaging of the chest was performed following the standard protocol without IV contrast.  COMPARISON:  03/04/2017 PET-CT.  02/19/2017 chest CT.  FINDINGS: Cardiovascular: Normal heart size. No significant pericardial fluid/thickening. Coronary atherosclerosis. Atherosclerotic nonaneurysmal thoracic aorta. Normal caliber pulmonary arteries.  Mediastinum/Nodes: No discrete thyroid nodules. Unremarkable esophagus. No pathologically enlarged axillary, mediastinal or gross hilar lymph nodes, noting limited sensitivity for the detection of hilar adenopathy on this noncontrast study.  Lungs/Pleura: No pneumothorax. No pleural effusion. Status post superior segmentectomy in the left lower lobe. There is continued mild growth of the solid bandlike irregular nodularity in the anterior left lower lobe along the surgical sutures, which measures 1.8 x 1.1 cm on series 5/ image 64, mildly increased from 1.7 x 0.9 cm, and which measures 2.3 x 1.6 cm more centrally on series 5/ image 66, mildly increased from 2.0 x 1.2 cm. New mild patchy consolidation in the inferior right middle lobe superimposed on chronic patchy tree-in-bud opacities. New subsolid 5 mm right upper lobe pulmonary nodule (series 5/ image 39). Additional previously described subcentimeter scattered ground-glass pulmonary nodules in the bilateral upper lobes measuring up to 9 mm in the left upper lobe (series 5/image 30) are unchanged. Patchy radiation fibrosis in the anterior mid to upper right lung is stable.  Upper abdomen: Unremarkable.  Musculoskeletal: No aggressive appearing focal osseous lesions. Marked thoracic spondylosis.  IMPRESSION: 1. Continued mild interval growth of  irregular solid bandlike nodularity in the anterior left lower lobe along the segmentectomy suture line. Findings remain worrisome for slowly progressive local tumor recurrence. 2. New patchy consolidation superimposed on chronic tree-in-bud opacities in the inferior right middle lobe, nonspecific, more likely a mild pneumonia. 3. New nonspecific subsolid 5 mm right upper lobe pulmonary nodule. Additional subcentimeter ground-glass bilateral upper lobe pulmonary nodules are stable. Attention recommended on follow-up chest CT in 3-6 months. 4. Coronary atherosclerosis.  Aortic Atherosclerosis (ICD10-I70.0).   Electronically Signed   By: Ilona Sorrel M.D.   On: 06/10/2017 18:11 I personally reviewed the CT images and concur with the findings noted above.  I also reviewed the images with [name].  Impression: [name] is an 84 year old woman with multiple medical problems who had a left lower lobe superior segmentectomy in 2016 for a stage IA non-small cell carcinoma.  She developed soft tissue thickening around her staple line, which has slowly progressed.  On PET CT there were no other findings but the area was mildly hypermetabolic.  A follow-up CT now shows continued slowly increasing soft tissue thickening.  This will be a tricky area to biopsy due to her previous surgery.  I think is now probably large enough to have a reasonable chance to get a biopsy with navigational bronchoscopy.  I would prefer that initially over a CT-guided biopsy due to the amount of lung that would need to be traversed with CT.  I described the proposed procedure to [name].  She understands this would be done in the operating room under general anesthesia.  We would plan to do this on an outpatient basis.  I informed her of the indications, risks, benefits, and alternatives.  She understands the limitations of the procedure and that there is no guarantee of a definitive diagnosis.  She understands  the risks include, but are not limited to death, MI, stroke, blood clots, bleeding, pneumothorax, as well as possibility of other unforeseeable complications.  She is willing to proceed, but her husband is having some medical testing done currently and she wants to wait until the results of that are known before scheduling the biopsy.  Hypertension-blood pressure was elevated today.  Recommended follow-up with primary care.  Plan: Electromagnetic navigational bronchoscopy for biopsy of left lower lobe soft tissue.  Patient will call to schedule  [name], MD [facility] [phone]

## 2017-06-11 NOTE — Progress Notes (Signed)
Melissa Snyder [address]       [place] [address]             [phone]     HPI: Melissa Snyder returns for a scheduled follow up visit  Melissa Snyder is an 84 year old woman with a past history significant for stage IA non-small cell lung cancer of the left lower lobe, prior stroke with residual blindness left eye, hypertension, hyperlipidemia, hypothyroidism, melanoma in situ, meningioma, breast cancer, left bundle branch block, and reflux.  She had a left lower lobe superior segmentectomy for stage Ia non-small cell carcinoma in June 2016.  She did not require adjuvant therapy.  She has been followed with CTs.  In September her CT scan showed an increase in soft tissue density around her previous staple line.  A PET/CT was done to further evaluate that in October.  There was mild hypermetabolic activity.  Due to the potential difficulties in biopsy this was discussed at our multidisciplinary thoracic oncology conference.  Consensus was to continue to follow the lesion radiographically.  In the interim since her last visit she is noticed some pain under her left breast when she first gets up and starts to move around in the morning.  This then goes away and does not bother her the rest of the day.  She has not had any problems with her breathing.  Her appetite is good she has not had any significant weight loss.  Past Medical History:  Diagnosis Date  . Blind left eye 10/2013   cva  . Breast cancer ([place]) 05/2014   ER+/PR+/Her2-  . Bundle branch block left   . Chest pain   . Family history of bladder cancer   . Family history of breast cancer   . Family history of prostate cancer   . GERD (gastroesophageal reflux disease)   . History of blood transfusion    age 14  . Hyperlipidemia   . Hypertension   . Hypothyroid   . Lung nodule   . Melanoma in situ of face ([place]) JUNE 2015   LEFT CHEEK  . Meningioma ([place])    brain lining  . Migraine    reports only having one  .  Osteoarthritis   . Radiation 09/16/14-10/07/14   Right  Breast  21 fractions  . Skin cancer   . Stroke [facility] 2015   Blind in left eye as a result    Current Outpatient Medications  Medication Sig Dispense Refill  . alendronate (FOSAMAX) 70 MG tablet Take 70 mg by mouth once a week. Pt takes medication before breakfast with full glass of water. Pt sits upright for 30 minutes after taking medicine. Do not  lie down or recline for at least 30 minutes after taking medication    . aspirin EC 81 MG tablet Take 81 mg by mouth daily.    Marland Kitchen atenolol (TENORMIN) 50 MG tablet Take 50 mg by mouth at bedtime.     Marland Kitchen b complex vitamins capsule Take 1 capsule by mouth daily.    . calcium-vitamin D (OSCAL WITH D) 500-200 MG-UNIT per tablet Take 1 tablet by mouth every morning.    . Cholecalciferol (VITAMIN D) 2000 units tablet Take 2,000 Units by mouth daily.    . fish oil-omega-3 fatty acids 1000 MG capsule Take 1 g by mouth every morning.     Marland Kitchen levothyroxine (SYNTHROID, LEVOTHROID) 112 MCG tablet Take 112 mcg by mouth every morning.     Marland Kitchen  lisinopril (PRINIVIL,ZESTRIL) 10 MG tablet Take 1 tablet (10 mg total) by mouth daily. (Patient taking differently: Take 10 mg by mouth 2 (two) times daily. ) 30 tablet 1  . Multiple Vitamin (MULTIVITAMIN) tablet Take 1 tablet by mouth every morning.     . Multiple Vitamins-Minerals (VISION FORMULA PO) Take 1 capsule by mouth daily.    . vitamin E (E-400) 400 UNIT capsule Take 400 Units by mouth 3 (three) times a week. Mon Wed Fri     No current facility-administered medications for this visit.     Physical Exam BP (!) 171/84 (BP Location: Right Arm, Patient Position: Sitting, Cuff Size: Normal)   Pulse 67   Resp 18   Ht '5\' 6"'  (1.676 m)   Wt 136 lb 9.6 oz (62 kg)   SpO2 98% Comment: RA  BMI 22.8 kg/m  84 year old woman in no acute distress Flat affect Alert and oriented x3 with no focal motor deficits No cervical or subclavicular adenopathy Cardiac regular rate  and rhythm normal S1 and S2 Lungs diminished breath sounds bilaterally, no rales or wheezing Abdomen soft nontender Extremities without clubbing cyanosis or edema  Diagnostic Tests: CT CHEST WITHOUT CONTRAST  TECHNIQUE: Multidetector CT imaging of the chest was performed following the standard protocol without IV contrast.  COMPARISON:  03/04/2017 PET-CT.  02/19/2017 chest CT.  FINDINGS: Cardiovascular: Normal heart size. No significant pericardial fluid/thickening. Coronary atherosclerosis. Atherosclerotic nonaneurysmal thoracic aorta. Normal caliber pulmonary arteries.  Mediastinum/Nodes: No discrete thyroid nodules. Unremarkable esophagus. No pathologically enlarged axillary, mediastinal or gross hilar lymph nodes, noting limited sensitivity for the detection of hilar adenopathy on this noncontrast study.  Lungs/Pleura: No pneumothorax. No pleural effusion. Status post superior segmentectomy in the left lower lobe. There is continued mild growth of the solid bandlike irregular nodularity in the anterior left lower lobe along the surgical sutures, which measures 1.8 x 1.1 cm on series 5/ image 64, mildly increased from 1.7 x 0.9 cm, and which measures 2.3 x 1.6 cm more centrally on series 5/ image 66, mildly increased from 2.0 x 1.2 cm. New mild patchy consolidation in the inferior right middle lobe superimposed on chronic patchy tree-in-bud opacities. New subsolid 5 mm right upper lobe pulmonary nodule (series 5/ image 39). Additional previously described subcentimeter scattered ground-glass pulmonary nodules in the bilateral upper lobes measuring up to 9 mm in the left upper lobe (series 5/image 30) are unchanged. Patchy radiation fibrosis in the anterior mid to upper right lung is stable.  Upper abdomen: Unremarkable.  Musculoskeletal: No aggressive appearing focal osseous lesions. Marked thoracic spondylosis.  IMPRESSION: 1. Continued mild interval growth of  irregular solid bandlike nodularity in the anterior left lower lobe along the segmentectomy suture line. Findings remain worrisome for slowly progressive local tumor recurrence. 2. New patchy consolidation superimposed on chronic tree-in-bud opacities in the inferior right middle lobe, nonspecific, more likely a mild pneumonia. 3. New nonspecific subsolid 5 mm right upper lobe pulmonary nodule. Additional subcentimeter ground-glass bilateral upper lobe pulmonary nodules are stable. Attention recommended on follow-up chest CT in 3-6 months. 4. Coronary atherosclerosis.  Aortic Atherosclerosis (ICD10-I70.0).   Electronically Signed   By: Ilona Sorrel M.D.   On: 06/10/2017 18:11 I personally reviewed the CT images and concur with the findings noted above.  I also reviewed the images with Melissa Snyder.  Impression: Melissa Snyder is an 84 year old woman with multiple medical problems who had a left lower lobe superior segmentectomy in 2016 for a stage IA non-small cell carcinoma.  She developed soft tissue thickening around her staple line, which has slowly progressed.  On PET CT there were no other findings but the area was mildly hypermetabolic.  A follow-up CT now shows continued slowly increasing soft tissue thickening.  This will be a tricky area to biopsy due to her previous surgery.  I think is now probably large enough to have a reasonable chance to get a biopsy with navigational bronchoscopy.  I would prefer that initially over a CT-guided biopsy due to the amount of lung that would need to be traversed with CT.  I described the proposed procedure to Melissa Snyder.  She understands this would be done in the operating room under general anesthesia.  We would plan to do this on an outpatient basis.  I informed her of the indications, risks, benefits, and alternatives.  She understands the limitations of the procedure and that there is no guarantee of a definitive diagnosis.  She understands  the risks include, but are not limited to death, MI, stroke, blood clots, bleeding, pneumothorax, as well as possibility of other unforeseeable complications.  She is willing to proceed, but her husband is having some medical testing done currently and she wants to wait until the results of that are known before scheduling the biopsy.  Hypertension-blood pressure was elevated today.  Recommended follow-up with primary care.  Plan: Electromagnetic navigational bronchoscopy for biopsy of left lower lobe soft tissue.  Patient will call to schedule  [name], MD [facility] [phone]

## 2017-06-17 ENCOUNTER — Ambulatory Visit
Admission: RE | Admit: 2017-06-17 | Discharge: 2017-06-17 | Disposition: A | Discharge status: Home / Self Care | Payer: Medicare HMO | Source: Ambulatory Visit | Attending: Adult Health | Admitting: Adult Health

## 2017-06-17 DIAGNOSIS — R928 Other abnormal and inconclusive findings on diagnostic imaging of breast: Secondary | ICD-10-CM | POA: Diagnosis not present

## 2017-06-17 DIAGNOSIS — C50411 Malignant neoplasm of upper-outer quadrant of right female breast: Secondary | ICD-10-CM

## 2017-06-17 DIAGNOSIS — Z170 Estrogen receptor positive status [ER+]: Secondary | ICD-10-CM

## 2017-06-17 HISTORY — DX: Personal history of irradiation: Z92.3

## 2017-06-17 NOTE — Pre-Procedure Instructions (Signed)
Melissa Snyder  06/17/2017     Your procedure is scheduled on January 16.  Report to [facility] Admitting at [phone] A.M.  Call this number if you have problems the morning of surgery:  [phone]   Remember:  Do not eat food or drink liquids after midnight.  Take these medicines the morning of surgery with A SIP OF WATER Tylenol (if needed), levothyroxine, eye drops    STOP Vitamin E, Vision Vitamins, Multiple Vitamins, Fish Oil, Vitamin D, Calcium, B Complex, Aspirin today   STOP/ Do not take Aspirin, Aleve, Naproxen, Advil, Ibuprofen, Motrin, Vitamins, Herbs, or Supplements starting today    Do not wear jewelry, make-up or nail polish.  Do not wear lotions, powders, or perfumes, or deodorant.  Do not shave 48 hours prior to surgery.  Men may shave face and neck.  Do not bring valuables to the hospital.  [facility] is not responsible for any belongings or valuables.  Contacts, dentures or bridgework may not be worn into surgery.  Leave your suitcase in the car.  After surgery it may be brought to your room.  For patients admitted to the hospital, discharge time will be determined by your treatment team.  Patients discharged the day of surgery will not be allowed to drive home.   [place] - Preparing for Surgery  Before surgery, you can play an important role.  Because skin is not sterile, your skin needs to be as free of germs as possible.  You can reduce the number of germs on you skin by washing with CHG (chlorahexidine gluconate) soap before surgery.  CHG is an antiseptic cleaner which kills germs and bonds with the skin to continue killing germs even after washing.  Please DO NOT use if you have an allergy to CHG or antibacterial soaps.  If your skin becomes reddened/irritated stop using the CHG and inform your nurse when you arrive at Short Stay.  Do not shave (including legs and underarms) for at least 48 hours prior to the first CHG shower.  You may shave  your face.  Please follow these instructions carefully:   1.  Shower with CHG Soap the night before surgery and the morning of Surgery.  2.  If you choose to wash your hair, wash your hair first as usual with your normal shampoo.  3.  After you shampoo, rinse your hair and body thoroughly to remove the shampoo.  4.  Use CHG as you would any other liquid soap.  You can apply CHG directly to the skin and wash gently with scrungie or a clean washcloth.  5.  Apply the CHG Soap to your body ONLY FROM THE NECK DOWN.  Do not use on open wounds or open sores.  Avoid contact with your eyes, ears, mouth and genitals (private parts).  Wash genitals (private parts) with your normal soap.  6.  Wash thoroughly, paying special attention to the area where your surgery will be performed.  7.  Thoroughly rinse your body with warm water from the neck down.  8.  DO NOT shower/wash with your normal soap after using and rinsing off the CHG Soap.  9.  Pat yourself dry with a clean towel.            10.  Wear clean pajamas.            11.  Place clean sheets on your bed the night of your first shower and do not  sleep with pets.  Day of Surgery  Do not apply any lotions the morning of surgery.  Please wear clean clothes to the hospital/surgery center.

## 2017-06-18 ENCOUNTER — Encounter (HOSPITAL_COMMUNITY): Payer: Self-pay

## 2017-06-18 ENCOUNTER — Ambulatory Visit (HOSPITAL_COMMUNITY)
Admission: RE | Admit: 2017-06-18 | Discharge: 2017-06-18 | Disposition: A | Discharge status: Home / Self Care | Payer: Medicare HMO | Source: Ambulatory Visit | Attending: Thoracic Surgery (Cardiothoracic Vascular Surgery) | Admitting: Thoracic Surgery (Cardiothoracic Vascular Surgery)

## 2017-06-18 ENCOUNTER — Other Ambulatory Visit: Payer: Self-pay

## 2017-06-18 ENCOUNTER — Encounter (HOSPITAL_COMMUNITY)
Admission: RE | Admit: 2017-06-18 | Discharge: 2017-06-18 | Disposition: A | Discharge status: Home / Self Care | Payer: Medicare HMO | Source: Ambulatory Visit | Attending: Thoracic Surgery (Cardiothoracic Vascular Surgery) | Admitting: Thoracic Surgery (Cardiothoracic Vascular Surgery)

## 2017-06-18 DIAGNOSIS — Z01812 Encounter for preprocedural laboratory examination: Secondary | ICD-10-CM | POA: Diagnosis not present

## 2017-06-18 DIAGNOSIS — R911 Solitary pulmonary nodule: Secondary | ICD-10-CM | POA: Insufficient documentation

## 2017-06-18 DIAGNOSIS — Z01810 Encounter for preprocedural cardiovascular examination: Secondary | ICD-10-CM | POA: Insufficient documentation

## 2017-06-18 DIAGNOSIS — I10 Essential (primary) hypertension: Secondary | ICD-10-CM | POA: Insufficient documentation

## 2017-06-18 DIAGNOSIS — I447 Left bundle-branch block, unspecified: Secondary | ICD-10-CM | POA: Diagnosis not present

## 2017-06-18 DIAGNOSIS — J40 Bronchitis, not specified as acute or chronic: Secondary | ICD-10-CM | POA: Diagnosis not present

## 2017-06-18 DIAGNOSIS — Z01818 Encounter for other preprocedural examination: Secondary | ICD-10-CM | POA: Diagnosis not present

## 2017-06-18 LAB — CBC
HCT: 38.2 % (ref 36.0–46.0)
Hemoglobin: 12.3 g/dL (ref 12.0–15.0)
MCH: 28.7 pg (ref 26.0–34.0)
MCHC: 32.2 g/dL (ref 30.0–36.0)
MCV: 89 fL (ref 78.0–100.0)
PLATELETS: 152 10*3/uL (ref 150–400)
RBC: 4.29 MIL/uL (ref 3.87–5.11)
RDW: 14.1 % (ref 11.5–15.5)
WBC: 4.7 10*3/uL (ref 4.0–10.5)

## 2017-06-18 LAB — COMPREHENSIVE METABOLIC PANEL
ALT: 16 U/L (ref 14–54)
ANION GAP: 10 (ref 5–15)
AST: 23 U/L (ref 15–41)
Albumin: 3.9 g/dL (ref 3.5–5.0)
Alkaline Phosphatase: 72 U/L (ref 38–126)
BUN: 14 mg/dL (ref 6–20)
CHLORIDE: 103 mmol/L (ref 101–111)
CO2: 25 mmol/L (ref 22–32)
CREATININE: 0.63 mg/dL (ref 0.44–1.00)
Calcium: 9.3 mg/dL (ref 8.9–10.3)
Glucose, Bld: 98 mg/dL (ref 65–99)
POTASSIUM: 3.7 mmol/L (ref 3.5–5.1)
Sodium: 138 mmol/L (ref 135–145)
Total Bilirubin: 0.7 mg/dL (ref 0.3–1.2)
Total Protein: 7 g/dL (ref 6.5–8.1)

## 2017-06-18 LAB — APTT: aPTT: 30 seconds (ref 24–36)

## 2017-06-18 LAB — PROTIME-INR
INR: 0.99
PROTHROMBIN TIME: 13 s (ref 11.4–15.2)

## 2017-06-19 ENCOUNTER — Ambulatory Visit (HOSPITAL_COMMUNITY): Payer: Medicare HMO

## 2017-06-19 ENCOUNTER — Ambulatory Visit (HOSPITAL_COMMUNITY): Payer: Medicare HMO | Admitting: Certified Registered Nurse Anesthetist

## 2017-06-19 ENCOUNTER — Encounter (HOSPITAL_COMMUNITY): Payer: Self-pay | Admitting: Surgery

## 2017-06-19 ENCOUNTER — Encounter (HOSPITAL_COMMUNITY)
Admission: RE | Disposition: A | Discharge status: Home / Self Care | Payer: Self-pay | Source: Ambulatory Visit | Attending: Thoracic Surgery (Cardiothoracic Vascular Surgery)

## 2017-06-19 ENCOUNTER — Ambulatory Visit (HOSPITAL_COMMUNITY)
Admission: RE | Admit: 2017-06-19 | Discharge: 2017-06-19 | Disposition: A | Discharge status: Home / Self Care | Payer: Medicare HMO | Source: Ambulatory Visit | Attending: Thoracic Surgery (Cardiothoracic Vascular Surgery) | Admitting: Thoracic Surgery (Cardiothoracic Vascular Surgery)

## 2017-06-19 DIAGNOSIS — Z7982 Long term (current) use of aspirin: Secondary | ICD-10-CM | POA: Diagnosis not present

## 2017-06-19 DIAGNOSIS — R848 Other abnormal findings in specimens from respiratory organs and thorax: Secondary | ICD-10-CM | POA: Diagnosis not present

## 2017-06-19 DIAGNOSIS — Z8052 Family history of malignant neoplasm of bladder: Secondary | ICD-10-CM | POA: Insufficient documentation

## 2017-06-19 DIAGNOSIS — Z85820 Personal history of malignant melanoma of skin: Secondary | ICD-10-CM | POA: Diagnosis not present

## 2017-06-19 DIAGNOSIS — I69398 Other sequelae of cerebral infarction: Secondary | ICD-10-CM | POA: Insufficient documentation

## 2017-06-19 DIAGNOSIS — Z79899 Other long term (current) drug therapy: Secondary | ICD-10-CM | POA: Insufficient documentation

## 2017-06-19 DIAGNOSIS — E039 Hypothyroidism, unspecified: Secondary | ICD-10-CM | POA: Insufficient documentation

## 2017-06-19 DIAGNOSIS — C3490 Malignant neoplasm of unspecified part of unspecified bronchus or lung: Secondary | ICD-10-CM

## 2017-06-19 DIAGNOSIS — I447 Left bundle-branch block, unspecified: Secondary | ICD-10-CM | POA: Diagnosis not present

## 2017-06-19 DIAGNOSIS — Z803 Family history of malignant neoplasm of breast: Secondary | ICD-10-CM | POA: Diagnosis not present

## 2017-06-19 DIAGNOSIS — H5462 Unqualified visual loss, left eye, normal vision right eye: Secondary | ICD-10-CM | POA: Insufficient documentation

## 2017-06-19 DIAGNOSIS — E785 Hyperlipidemia, unspecified: Secondary | ICD-10-CM | POA: Insufficient documentation

## 2017-06-19 DIAGNOSIS — Z79890 Hormone replacement therapy (postmenopausal): Secondary | ICD-10-CM | POA: Diagnosis not present

## 2017-06-19 DIAGNOSIS — H5440 Blindness, one eye, unspecified eye: Secondary | ICD-10-CM | POA: Diagnosis not present

## 2017-06-19 DIAGNOSIS — Z8673 Personal history of transient ischemic attack (TIA), and cerebral infarction without residual deficits: Secondary | ICD-10-CM | POA: Diagnosis not present

## 2017-06-19 DIAGNOSIS — Z853 Personal history of malignant neoplasm of breast: Secondary | ICD-10-CM | POA: Insufficient documentation

## 2017-06-19 DIAGNOSIS — I700 Atherosclerosis of aorta: Secondary | ICD-10-CM | POA: Insufficient documentation

## 2017-06-19 DIAGNOSIS — Z85118 Personal history of other malignant neoplasm of bronchus and lung: Secondary | ICD-10-CM | POA: Insufficient documentation

## 2017-06-19 DIAGNOSIS — I10 Essential (primary) hypertension: Secondary | ICD-10-CM | POA: Diagnosis not present

## 2017-06-19 DIAGNOSIS — C3432 Malignant neoplasm of lower lobe, left bronchus or lung: Secondary | ICD-10-CM | POA: Insufficient documentation

## 2017-06-19 DIAGNOSIS — R911 Solitary pulmonary nodule: Secondary | ICD-10-CM | POA: Diagnosis not present

## 2017-06-19 DIAGNOSIS — R222 Localized swelling, mass and lump, trunk: Secondary | ICD-10-CM | POA: Diagnosis not present

## 2017-06-19 DIAGNOSIS — I2510 Atherosclerotic heart disease of native coronary artery without angina pectoris: Secondary | ICD-10-CM | POA: Diagnosis not present

## 2017-06-19 DIAGNOSIS — K219 Gastro-esophageal reflux disease without esophagitis: Secondary | ICD-10-CM | POA: Diagnosis not present

## 2017-06-19 DIAGNOSIS — Z8042 Family history of malignant neoplasm of prostate: Secondary | ICD-10-CM | POA: Diagnosis not present

## 2017-06-19 HISTORY — PX: VIDEO BRONCHOSCOPY WITH ENDOBRONCHIAL NAVIGATION: SHX6175

## 2017-06-19 SURGERY — VIDEO BRONCHOSCOPY WITH ENDOBRONCHIAL NAVIGATION
Anesthesia: General

## 2017-06-19 MED ORDER — FENTANYL CITRATE (PF) 250 MCG/5ML IJ SOLN
INTRAMUSCULAR | Status: DC | PRN
  Administered 2017-06-19 (×2): 75 ug via INTRAVENOUS

## 2017-06-19 MED ORDER — PROPOFOL 10 MG/ML IV BOLUS
INTRAVENOUS | Status: DC | PRN
  Administered 2017-06-19: 90 mg via INTRAVENOUS

## 2017-06-19 MED ORDER — ROCURONIUM BROMIDE 10 MG/ML (PF) SYRINGE
PREFILLED_SYRINGE | INTRAVENOUS | Status: AC
  Filled 2017-06-19: qty 5

## 2017-06-19 MED ORDER — LIDOCAINE 2% (20 MG/ML) 5 ML SYRINGE
INTRAMUSCULAR | Status: AC
  Filled 2017-06-19: qty 5

## 2017-06-19 MED ORDER — ONDANSETRON HCL 4 MG/2ML IJ SOLN
INTRAMUSCULAR | Status: DC | PRN
  Administered 2017-06-19: 4 mg via INTRAVENOUS

## 2017-06-19 MED ORDER — LIDOCAINE 2% (20 MG/ML) 5 ML SYRINGE
INTRAMUSCULAR | Status: DC | PRN
  Administered 2017-06-19: 100 mg via INTRAVENOUS

## 2017-06-19 MED ORDER — ONDANSETRON HCL 4 MG/2ML IJ SOLN
INTRAMUSCULAR | Status: AC
  Filled 2017-06-19: qty 2

## 2017-06-19 MED ORDER — SUGAMMADEX SODIUM 200 MG/2ML IV SOLN
INTRAVENOUS | Status: AC
  Filled 2017-06-19: qty 2

## 2017-06-19 MED ORDER — PROPOFOL 10 MG/ML IV BOLUS
INTRAVENOUS | Status: AC
  Filled 2017-06-19: qty 20

## 2017-06-19 MED ORDER — LACTATED RINGERS IV SOLN
INTRAVENOUS | Status: DC
  Administered 2017-06-19: 11:00:00 via INTRAVENOUS

## 2017-06-19 MED ORDER — ESMOLOL HCL 100 MG/10ML IV SOLN
INTRAVENOUS | Status: AC
  Filled 2017-06-19: qty 10

## 2017-06-19 MED ORDER — DEXAMETHASONE SODIUM PHOSPHATE 10 MG/ML IJ SOLN
INTRAMUSCULAR | Status: DC | PRN
  Administered 2017-06-19: 5 mg via INTRAVENOUS

## 2017-06-19 MED ORDER — PHENYLEPHRINE 40 MCG/ML (10ML) SYRINGE FOR IV PUSH (FOR BLOOD PRESSURE SUPPORT)
PREFILLED_SYRINGE | INTRAVENOUS | Status: DC | PRN
  Administered 2017-06-19: 80 ug via INTRAVENOUS

## 2017-06-19 MED ORDER — FENTANYL CITRATE (PF) 250 MCG/5ML IJ SOLN
INTRAMUSCULAR | Status: AC
  Filled 2017-06-19: qty 5

## 2017-06-19 MED ORDER — 0.9 % SODIUM CHLORIDE (POUR BTL) OPTIME
TOPICAL | Status: DC | PRN
  Administered 2017-06-19: 1000 mL

## 2017-06-19 MED ORDER — EPINEPHRINE PF 1 MG/ML IJ SOLN
INTRAMUSCULAR | Status: AC
  Filled 2017-06-19: qty 1

## 2017-06-19 MED ORDER — DEXAMETHASONE SODIUM PHOSPHATE 10 MG/ML IJ SOLN
INTRAMUSCULAR | Status: AC
  Filled 2017-06-19: qty 1

## 2017-06-19 MED ORDER — SUGAMMADEX SODIUM 200 MG/2ML IV SOLN
INTRAVENOUS | Status: DC | PRN
  Administered 2017-06-19: 150 mg via INTRAVENOUS

## 2017-06-19 MED ORDER — ROCURONIUM BROMIDE 10 MG/ML (PF) SYRINGE
PREFILLED_SYRINGE | INTRAVENOUS | Status: DC | PRN
  Administered 2017-06-19: 35 mg via INTRAVENOUS
  Administered 2017-06-19: 15 mg via INTRAVENOUS

## 2017-06-19 MED ORDER — KETOROLAC TROMETHAMINE 30 MG/ML IJ SOLN
INTRAMUSCULAR | Status: AC
  Filled 2017-06-19: qty 1

## 2017-06-19 SURGICAL SUPPLY — 39 items
ADAPTER BRONCH F/PENTAX (ADAPTER) ×3 IMPLANT
ADPR BSCP EDG PNTX (ADAPTER) ×2
BRUSH BIOPSY BRONCH 10 SDTNB (MISCELLANEOUS) IMPLANT
BRUSH SUPERTRAX BIOPSY (INSTRUMENTS) IMPLANT
BRUSH SUPERTRAX NDL-TIP CYTO (INSTRUMENTS) ×2 IMPLANT
CANISTER SUCT 3000ML PPV (MISCELLANEOUS) ×2 IMPLANT
CHANNEL WORK EXTEND EDGE 180 (KITS) IMPLANT
CHANNEL WORK EXTEND EDGE 45 (KITS) IMPLANT
CHANNEL WORK EXTEND EDGE 90 (KITS) IMPLANT
CONT SPEC 4OZ CLIKSEAL STRL BL (MISCELLANEOUS) ×5 IMPLANT
COVER BACK TABLE 60X90IN (DRAPES) ×2 IMPLANT
FILTER STRAW FLUID ASPIR (MISCELLANEOUS) IMPLANT
FORCEPS BIOP SUPERTRX PREMAR (INSTRUMENTS) ×1 IMPLANT
GAUZE SPONGE 4X4 12PLY STRL (GAUZE/BANDAGES/DRESSINGS) ×2 IMPLANT
GLOVE SURG SIGNA 7.5 PF LTX (GLOVE) ×2 IMPLANT
GOWN STRL REUS W/ TWL XL LVL3 (GOWN DISPOSABLE) ×1 IMPLANT
GOWN STRL REUS W/TWL XL LVL3 (GOWN DISPOSABLE) ×2
KIT CLEAN ENDO COMPLIANCE (KITS) ×2 IMPLANT
KIT ENDOBRONCHIAL EDGE FIRM (KITS) ×1 IMPLANT
KIT PROCEDURE EDGE 180 (KITS) IMPLANT
KIT PROCEDURE EDGE 45 (KITS) IMPLANT
KIT PROCEDURE EDGE 90 (KITS) IMPLANT
KIT ROOM TURNOVER OR (KITS) ×2 IMPLANT
MARKER SKIN DUAL TIP RULER LAB (MISCELLANEOUS) ×2 IMPLANT
NDL SUPERTRX PREMARK BIOPSY (NEEDLE) IMPLANT
NEEDLE SUPERTRX PREMARK BIOPSY (NEEDLE) ×4 IMPLANT
NS IRRIG 1000ML POUR BTL (IV SOLUTION) ×2 IMPLANT
OIL SILICONE PENTAX (PARTS (SERVICE/REPAIRS)) ×2 IMPLANT
PAD ARMBOARD 7.5X6 YLW CONV (MISCELLANEOUS) ×4 IMPLANT
PATCHES PATIENT (LABEL) ×6 IMPLANT
SYR 20CC LL (SYRINGE) ×2 IMPLANT
SYR 20ML ECCENTRIC (SYRINGE) ×2 IMPLANT
SYR 30ML LL (SYRINGE) ×2 IMPLANT
SYR 5ML LL (SYRINGE) ×2 IMPLANT
TOWEL GREEN STERILE (TOWEL DISPOSABLE) ×2 IMPLANT
TRAP SPECIMEN MUCOUS 40CC (MISCELLANEOUS) ×2 IMPLANT
TUBE CONNECTING 20X1/4 (TUBING) ×4 IMPLANT
UNDERPAD 30X30 (UNDERPADS AND DIAPERS) ×2 IMPLANT
WATER STERILE IRR 1000ML POUR (IV SOLUTION) ×2 IMPLANT

## 2017-06-19 NOTE — Anesthesia Postprocedure Evaluation (Signed)
Anesthesia Post Note  Patient: Melissa Snyder  Procedure(s) Performed: VIDEO BRONCHOSCOPY (N/A )     Patient location during evaluation: PACU Anesthesia Type: General Level of consciousness: awake and alert Pain management: pain level controlled Vital Signs Assessment: post-procedure vital signs reviewed and stable Respiratory status: spontaneous breathing, nonlabored ventilation, respiratory function stable and patient connected to nasal cannula oxygen Cardiovascular status: blood pressure returned to baseline and stable Postop Assessment: no apparent nausea or vomiting Anesthetic complications: no    Last Vitals:  Vitals:   06/19/17 0956 06/19/17 1335  BP: (!) 188/69 (!) 148/73  Pulse: 70 73  Resp: 18 18  Temp: (!) 36.3 C (!) 36.1 C  SpO2: 100% 93%    Last Pain:  Vitals:   06/19/17 1335  TempSrc:   PainSc: 0-No pain                 Barnet Glasgow

## 2017-06-19 NOTE — Transfer of Care (Signed)
Immediate Anesthesia Transfer of Care Note  Patient: Melissa Snyder  Procedure(s) Performed: VIDEO BRONCHOSCOPY (N/A )  Patient Location: PACU  Anesthesia Type:General  Level of Consciousness: awake, alert  and patient cooperative  Airway & Oxygen Therapy: Patient Spontanous Breathing  Post-op Assessment: Report given to RN and Post -op Vital signs reviewed and stable  Post vital signs: Reviewed and stable  Last Vitals:  Vitals:   06/19/17 0956 06/19/17 1335  BP: (!) 188/69   Pulse: 70   Resp: 18   Temp: (!) 36.3 C (!) (P) 36.1 C  SpO2: 100%     Last Pain:  Vitals:   06/19/17 0956  TempSrc: Oral      Patients Stated Pain Goal: 3 (36/12/24 4975)  Complications: No apparent anesthesia complications

## 2017-06-19 NOTE — Interval H&P Note (Signed)
History and Physical Interval Note:  06/19/2017 11:13 AM  Melissa Snyder  has presented today for surgery, with the diagnosis of HX OF LUNG CANCER LUL NODULE  The various methods of treatment have been discussed with the patient and family. After consideration of risks, benefits and other options for treatment, the patient has consented to  Procedure(s): [place] (N/A) as a surgical intervention .  The patient's history has been reviewed, patient examined, no change in status, stable for surgery.  I have reviewed the patient's chart and labs.  Questions were answered to the patient's satisfaction.     [name]

## 2017-06-19 NOTE — Anesthesia Preprocedure Evaluation (Addendum)
Anesthesia Evaluation  Patient identified by MRN, date of birth, ID band Patient awake    Reviewed: Allergy & Precautions, NPO status , Patient's Chart, lab work & pertinent test results  Airway Mallampati: II  TM Distance: >3 FB Neck ROM: Full    Dental no notable dental hx.    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, Residual Symptoms negative psych ROS   GI/Hepatic negative GI ROS, Neg liver ROS,   Endo/Other  negative endocrine ROSHypothyroidism   Renal/GU negative Renal ROS  negative genitourinary   Musculoskeletal negative musculoskeletal ROS (+)   Abdominal   Peds  Hematology negative hematology ROS (+)   Anesthesia Other Findings   Reproductive/Obstetrics                            Lab Results  Component Value Date   WBC 4.7 06/18/2017   HGB 12.3 06/18/2017   HCT 38.2 06/18/2017   MCV 89.0 06/18/2017   PLT 152 06/18/2017   Lab Results  Component Value Date   CREATININE 0.63 06/18/2017   BUN 14 06/18/2017   NA 138 06/18/2017   K 3.7 06/18/2017   CL 103 06/18/2017   CO2 25 06/18/2017    Anesthesia Physical Anesthesia Plan  ASA: III  Anesthesia Plan: General   Post-op Pain Management:    Induction: Intravenous  PONV Risk Score and Plan:   Airway Management Planned: Oral ETT  Additional Equipment:   Intra-op Plan:   Post-operative Plan: Extubation in OR  Informed Consent: I have reviewed the patients History and Physical, chart, labs and discussed the procedure including the risks, benefits and alternatives for the proposed anesthesia with the patient or authorized representative who has indicated his/her understanding and acceptance.   Dental advisory  given  Plan Discussed with: CRNA  Anesthesia Plan Comments:         Anesthesia Quick Evaluation

## 2017-06-19 NOTE — Brief Op Note (Signed)
06/19/2017  2:01 PM  PATIENT:  Melissa Snyder  84 y.o. female  PRE-OPERATIVE DIAGNOSIS:  HX OF LUNG CANCER LLL NODULE  POST-OPERATIVE DIAGNOSIS:  HX OF LUNG CANCER LLL NODULE- Malignant cells present  PROCEDURE:   NAVIGATIONAL BRONCHOSCOPY with NEEDLE ASPIRATIONS, BRUSHINGS and TRANSBRONCHIAL BIOPSIES  SURGEON:  Surgeon(s) and Role:    * [name], MD - Primary  PHYSICIAN ASSISTANT: none  ASSISTANTS: none   ANESTHESIA:   general  EBL:  10 mL   BLOOD ADMINISTERED:none  DRAINS: none   LOCAL MEDICATIONS USED:  NONE  SPECIMEN:  Source of Specimen:  LLL nodule  DISPOSITION OF SPECIMEN:  PATHOLOGY  COUNTS:  NO endoscopic  TOURNIQUET:  * No tourniquets in log *  DICTATION: .Other Dictation: Dictation Number -  PLAN OF CARE: Discharge to home after PACU  PATIENT DISPOSITION:  PACU - hemodynamically stable.   Delay start of Pharmacological VTE agent (>24hrs) due to surgical blood loss or risk of bleeding: not applicable

## 2017-06-19 NOTE — Op Note (Signed)
Melissa Snyder, Melissa Snyder                ACCOUNT NO.:  [id number]  MEDICAL RECORD NO.:  [id number]  LOCATION:  [facility]                         FACILITY:  [facility]  PHYSICIAN:  Revonda Standard. Roxan Hockey, M.D.DATE OF BIRTH:  01-Nov-1933  DATE OF PROCEDURE:  06/19/2017 DATE OF DISCHARGE:                              OPERATIVE REPORT   PREOPERATIVE DIAGNOSIS:  Left lower lobe nodule after previous superior segmentectomy.  POSTOPERATIVE DIAGNOSIS:  Left lower lobe nodule after previous superior segmentectomy, malignant cells present.  PROCEDURES:  Electromagnetic navigational bronchoscopy with needle aspirations, brushings, and transbronchial biopsies.  SURGEON:  Revonda Standard. Roxan Hockey, MD.  ASSISTANT:  None.  ANESTHESIA:  General.  FINDINGS:  Malignant cells seen on 2nd set of needle aspirations.  CLINICAL NOTE:  [name] is an 84 year old woman with a history of lung cancer.  She previously had a left lower lobe superior segmentectomy. On followup CT, she developed slowly increasing soft tissue along her staple line.  This now was felt to be large enough to consider attempted biopsy.  The indications, risks, benefits, and alternatives were discussed in detail with the patient.  She understood and accepted the risks and agreed to proceed.  OPERATIVE NOTE:  [name] was brought to the operating room on June 19, 2017.  The navigational planning was done prior to induction.  Two separate sites were selected for sampling on the lesion.  She had induction of general anesthesia.  After performing a time-out, flexible fiberoptic bronchoscopy was performed.  The stump of the left lower lobe superior segmental bronchus appeared well healed.  The remainder of the endobronchial tree was normal with no endobronchial lesions to the level of subsegmental bronchi.  The locatable guide for navigation was placed.  Registration was performed.  There was good correlation of the video and  virtual bronchoscopy.  The bronchoscope then was advanced to the takeoff of the left lower lobe bronchus and the appropriate subsegmental bronchus was cannulated with the locatable guide.  The locatable guide was within 5 mm of the center of the nodule as planned preoperatively.  Fluoroscopic navigation then was performed according to protocol, which moved the target slightly. After achieving good alignment with the target, needle aspirations, needle brushings and biopsies were performed.  All sampling was done under fluoroscopy.  Needle aspirations were performed first.  10-12 passes with the needle were performed.  Three aspirations were performed.  Next, 3 needle brushings were performed and then multiple biopsies were taken.  The needle aspirations and brushings were sent for immediate examination.  The remainder of the specimens were sent for permanent pathology.  The navigation then was repeated with the local navigation off and the sampling was repeated again with needle aspirations and needle brushings followed by biopsies.  Next, the catheter was repositioned to the more central part of the lesion as mapped. This was target 2. The sampling process was repeated.  The second set of needle aspirations showed malignant cells.  A bronchoalveolar lavage was performed.  Final inspection was made with the bronchoscope. There was no ongoing bleeding.  Bronchoscope was removed.  The patient was extubated in the operating room and taken to postanesthetic care unit in good  condition.     Revonda Standard Roxan Hockey, M.D.     SCH/MEDQ  D:  06/19/2017  T:  06/19/2017  Job:  [id number]

## 2017-06-19 NOTE — Anesthesia Procedure Notes (Signed)
Procedure Name: Intubation Date/Time: 06/19/2017 12:19 PM Performed by: Julieta Bellini, CRNA Pre-anesthesia Checklist: Patient identified, Emergency Drugs available, Suction available and Patient being monitored Patient Re-evaluated:Patient Re-evaluated prior to induction Oxygen Delivery Method: Circle system utilized Preoxygenation: Pre-oxygenation with 100% oxygen Induction Type: IV induction Ventilation: Mask ventilation without difficulty Laryngoscope Size: Mac and 3 Grade View: Grade I Tube type: Oral Tube size: 8.5 mm Number of attempts: 1 Airway Equipment and Method: Stylet Placement Confirmation: ETT inserted through vocal cords under direct vision,  breath sounds checked- equal and bilateral and positive ETCO2 Secured at: 22 cm Tube secured with: Tape Dental Injury: Teeth and Oropharynx as per pre-operative assessment

## 2017-06-19 NOTE — Discharge Instructions (Signed)
Do not drive or engage in heavy physical activity for 24 hours  You may resume normal activities tomorrow  You may use acetaminophen (tylenol) if needed for discomfort.  You may use an over the counter cough suppressant if needed  You may cough up small amounts of blood over the next few days.   Call [phone] if you develop chest pain, shortness of breath, fever > 101 or cough up more than 2 tablespoons of blood.  My office will contact you with follow up appointments

## 2017-06-20 ENCOUNTER — Telehealth: Payer: Self-pay | Admitting: *Deleted

## 2017-06-20 ENCOUNTER — Encounter (HOSPITAL_COMMUNITY): Payer: Self-pay | Admitting: Thoracic Surgery (Cardiothoracic Vascular Surgery)

## 2017-06-20 NOTE — Telephone Encounter (Signed)
Oncology Nurse Navigator Documentation  Oncology Nurse Navigator Flowsheets 06/20/2017  Navigator Location [facility]  Referral date to RadOnc/MedOnc 06/19/2017  Navigator Encounter Type Telephone/I received referral on Mr. Chamberlain. I called and spoke with patient. She is scheduled for [facility] on 06/27/17. She verbalized understanding of appt time and place.   Telephone Outgoing Call  Treatment Phase Pre-Tx/Tx Discussion  Barriers/Navigation Needs Coordination of Care  Interventions Coordination of Care  Coordination of Care Other  Acuity Level 2  Acuity Level 2 Assistance expediting appointments  Time Spent with Patient 15

## 2017-06-27 ENCOUNTER — Ambulatory Visit: Payer: Medicare HMO | Attending: Radiation Oncology | Admitting: Physical Therapy

## 2017-06-27 ENCOUNTER — Ambulatory Visit
Admission: RE | Admit: 2017-06-27 | Discharge: 2017-06-27 | Disposition: A | Discharge status: Home / Self Care | Payer: Medicare HMO | Source: Ambulatory Visit | Attending: Internal Medicine | Admitting: Internal Medicine

## 2017-06-27 ENCOUNTER — Encounter: Payer: Self-pay | Admitting: *Deleted

## 2017-06-27 ENCOUNTER — Other Ambulatory Visit: Payer: Self-pay

## 2017-06-27 VITALS — BP 157/56 | HR 72 | Temp 98.0°F | Resp 18 | Wt 135.7 lb

## 2017-06-27 DIAGNOSIS — Z510 Encounter for antineoplastic radiation therapy: Secondary | ICD-10-CM | POA: Insufficient documentation

## 2017-06-27 DIAGNOSIS — Z923 Personal history of irradiation: Secondary | ICD-10-CM | POA: Diagnosis not present

## 2017-06-27 DIAGNOSIS — R29898 Other symptoms and signs involving the musculoskeletal system: Secondary | ICD-10-CM | POA: Diagnosis not present

## 2017-06-27 DIAGNOSIS — E785 Hyperlipidemia, unspecified: Secondary | ICD-10-CM | POA: Insufficient documentation

## 2017-06-27 DIAGNOSIS — Z902 Acquired absence of lung [part of]: Secondary | ICD-10-CM | POA: Diagnosis not present

## 2017-06-27 DIAGNOSIS — Z853 Personal history of malignant neoplasm of breast: Secondary | ICD-10-CM | POA: Insufficient documentation

## 2017-06-27 DIAGNOSIS — C3432 Malignant neoplasm of lower lobe, left bronchus or lung: Secondary | ICD-10-CM | POA: Diagnosis not present

## 2017-06-27 DIAGNOSIS — R293 Abnormal posture: Secondary | ICD-10-CM | POA: Diagnosis not present

## 2017-06-27 DIAGNOSIS — Z85820 Personal history of malignant melanoma of skin: Secondary | ICD-10-CM | POA: Insufficient documentation

## 2017-06-27 DIAGNOSIS — R918 Other nonspecific abnormal finding of lung field: Secondary | ICD-10-CM | POA: Insufficient documentation

## 2017-06-27 DIAGNOSIS — Z8052 Family history of malignant neoplasm of bladder: Secondary | ICD-10-CM | POA: Insufficient documentation

## 2017-06-27 DIAGNOSIS — E039 Hypothyroidism, unspecified: Secondary | ICD-10-CM | POA: Insufficient documentation

## 2017-06-27 DIAGNOSIS — Z8673 Personal history of transient ischemic attack (TIA), and cerebral infarction without residual deficits: Secondary | ICD-10-CM | POA: Insufficient documentation

## 2017-06-27 DIAGNOSIS — Z803 Family history of malignant neoplasm of breast: Secondary | ICD-10-CM | POA: Insufficient documentation

## 2017-06-27 DIAGNOSIS — Z8042 Family history of malignant neoplasm of prostate: Secondary | ICD-10-CM | POA: Insufficient documentation

## 2017-06-27 DIAGNOSIS — I10 Essential (primary) hypertension: Secondary | ICD-10-CM | POA: Insufficient documentation

## 2017-06-27 DIAGNOSIS — D320 Benign neoplasm of cerebral meninges: Secondary | ICD-10-CM | POA: Insufficient documentation

## 2017-06-27 DIAGNOSIS — M1990 Unspecified osteoarthritis, unspecified site: Secondary | ICD-10-CM | POA: Insufficient documentation

## 2017-06-27 DIAGNOSIS — K219 Gastro-esophageal reflux disease without esophagitis: Secondary | ICD-10-CM | POA: Insufficient documentation

## 2017-06-27 NOTE — Progress Notes (Signed)
Oncology Nurse Navigator Documentation  Oncology Nurse Navigator Flowsheets 06/27/2017  Navigator Location [facility]  Navigator Encounter Type Clinic/MDC/I spoke with patient today at thoracic clinic. I gave and explained information on lung cancer, treatment, resources, and next steps.    Abnormal Finding Date 02/19/2017  Confirmed Diagnosis Date 06/19/2017  Multidisiplinary Clinic Date 06/27/2017  Patient Visit Type MedOnc  Treatment Phase Pre-Tx/Tx Discussion  Barriers/Navigation Needs Education  Education Newly Diagnosed Cancer Education;Other  Interventions Education  Education Method Verbal;Written  Acuity Level 2  Time Spent with Patient 30

## 2017-06-27 NOTE — Progress Notes (Addendum)
Radiation Oncology         ([phone]) [phone] ________________________________  Name: Melissa Snyder        MRN: [id number]  Date of Service: 06/27/2017 DOB: November 22, 1933  CW:[id number], Elta Guadeloupe, MD  Curt Bears, MD     REFERRING PHYSICIAN: Curt Bears, MD   DIAGNOSIS: The encounter diagnosis was Primary cancer of left lower lobe of lung ([place]).   HISTORY OF PRESENT ILLNESS: Melissa Snyder is a 84 y.o. female seen at the request of Dr. Roxan Hockey for a history of Stage IA, NSCLC, adenocarcinoma who underwent a left lower lobe superior segmentectomy in June 2016. She had negative margins and the tumor was 5 mm away from the suture line. She also had completed a course of radiotherapy following lumpectomy for Stage IA, pT1bN0, grade 1 ER/PR positive invasive ductal carcinoma of the right breast. She continues to follow with Dr. Lucia Gaskins but elected to forgo endocrine therapy. She has been followed since her surgery with Dr. Roxan Hockey and on recent imaging with CT of the chest in September, there was some increase in the density along her suture line in the left lower lobe measuring 17 x 9 mm and previously had been 14 x 7 mm. She underwent a PET scan on 03/04/18 revealing and SUV along a bandlike nodularity along the prior wedge resection. Scattered ground glass nodules were also noted. No metastatic disease was present. She had a repeat CT on 06/10/17 revealing the nodular area to be 18 x 11 mm. She comes today to discuss stereotactic body radiotherapy (SBRT) as the area is not amenable to surgical resection.    PREVIOUS RADIATION THERAPY:   09/16/2014 to 10/07/2014. The site and dose include the right breast at 42.72 Gy at 2.67 Gy per fraction x 21 fractions  PAST MEDICAL HISTORY:  Past Medical History:  Diagnosis Date  . Blind left eye 10/2013   cva  . Breast cancer ([place]) 05/2014   ER+/PR+/Her2-     LUNG CANCER  . Bundle branch block left   . Chest pain   . Family history of bladder cancer     . Family history of breast cancer   . Family history of prostate cancer   . GERD (gastroesophageal reflux disease)   . History of blood transfusion    age 9  . Hyperlipidemia   . Hypertension   . Hypothyroid   . Lung nodule   . Melanoma in situ of face ([place]) JUNE 2015   LEFT CHEEK  . Meningioma ([place])    brain lining  . Migraine    reports only having one  . Osteoarthritis   . Personal history of radiation therapy   . Radiation 09/16/14-10/07/14   Right  Breast  21 fractions  . Skin cancer   . Stroke [facility]) 2015   Blind in left eye as a result       PAST SURGICAL HISTORY: Past Surgical History:  Procedure Laterality Date  . ABDOMINAL HYSTERECTOMY  ~ 1997  . APPENDECTOMY     age 89  . BILATERAL SALPINGOOPHORECTOMY    . BLADDER REPAIR  2009   cystocele  . BREAST BIOPSY    . BREAST LUMPECTOMY Right   . BUNIONECTOMY     bilateral great toe  . CARDIAC CATHETERIZATION  2004  . CHOLECYSTECTOMY  ~ 1997  . COLONOSCOPY    . CRYO INTERCOSTAL NERVE BLOCK Left 11/25/2014   Procedure: CRYO INTERCOSTAL NERVE BLOCK;  Surgeon: [name], MD;  Location: [facility]  OR;  Service: Thoracic;  Laterality: Left;  . CT RADIATION THERAPY GUIDE     Gamma radiation -lt frontal-[facility]  . DILATION AND CURETTAGE OF UTERUS     X 2  . MELANOMA EXCISION Left 11/05/23, 11/30/13   CHEEK  . OTHER SURGICAL HISTORY     Gamma knife to Meningioma in brain lining  . SEGMENTECOMY Left 11/25/2014   Procedure: LEFT LOWER LOBE SEGMENTECTOMY;  Surgeon: [name], MD;  Location: [place];  Service: Thoracic;  Laterality: Left;  Marland Kitchen VIDEO ASSISTED THORACOSCOPY Left 11/25/2014   Procedure: VIDEO ASSISTED THORACOSCOPY;  Surgeon: [name], MD;  Location: [place];  Service: Thoracic;  Laterality: Left;  Marland Kitchen VIDEO BRONCHOSCOPY WITH ENDOBRONCHIAL NAVIGATION N/A 06/19/2017   Procedure: VIDEO BRONCHOSCOPY;  Surgeon: [name], MD;  Location: [place];  Service: Thoracic;  Laterality: N/A;  . VIDEO  BRONCHOSCOPY WITH ENDOBRONCHIAL ULTRASOUND N/A 11/25/2014   Procedure: VIDEO BRONCHOSCOPY WITH ENDOBRONCHIAL ULTRASOUND;  Surgeon: [name], MD;  Location: [place];  Service: Thoracic;  Laterality: N/A;     FAMILY HISTORY:  Family History  Problem Relation Age of Onset  . CAD Mother        CABG  . Hyperlipidemia Mother   . AAA (abdominal aortic aneurysm) Mother   . Renal Disease Father   . Kidney disease Father   . CAD Father   . Cancer Father        PROSTATE  . Diabetes Brother   . Hyperlipidemia Brother   . Breast cancer Daughter 16  . Breast cancer Sister 62  . Breast cancer Sister 10  . Cancer Brother        NOS  . Bladder Cancer Brother 69  . Lung cancer Cousin        3 paternal cousins with lung cancer  . Breast cancer Cousin        five paternal first cousins  . Cancer Cousin        4 paternal first cousins with Cancer NOS  . Cancer Cousin        1 paternal cousin with oral cancer (tongue)     SOCIAL HISTORY:  reports that  has never smoked. she has never used smokeless tobacco. She reports that she does not drink alcohol or use drugs. The patient is married and lives in [place].   ALLERGIES: Beef-derived products; Chocolate; Hydrocodone; Other; Tramadol; Ibuprofen; and Niacin and related   MEDICATIONS:  Current Outpatient Medications  Medication Sig Dispense Refill  . acetaminophen (TYLENOL) 500 MG tablet Take 500 mg by mouth every 8 (eight) hours as needed for mild pain or headache.    . alendronate (FOSAMAX) 70 MG tablet Take 70 mg by mouth once a week. Pt takes medication before breakfast with full glass of water. Pt sits upright for 30 minutes after taking medicine. Do not  lie down or recline for at least 30 minutes after taking medication ON SUNDAYS    . aspirin EC 81 MG tablet Take 81 mg by mouth daily.    Marland Kitchen atenolol (TENORMIN) 50 MG tablet Take 50 mg by mouth at bedtime.     Marland Kitchen b complex vitamins capsule Take 1 capsule by mouth daily.    .  calcium-vitamin D (OSCAL WITH D) 500-200 MG-UNIT per tablet Take 1 tablet by mouth at bedtime.     . Cholecalciferol (VITAMIN D) 2000 units tablet Take 2,000 Units by mouth daily.    . Digestive Enzymes (ENZYME DIGEST PO) Take  5 mLs by mouth 3 (three) times daily with meals.    . fish oil-omega-3 fatty acids 1000 MG capsule Take 1 g by mouth daily.     Marland Kitchen levothyroxine (SYNTHROID, LEVOTHROID) 112 MCG tablet Take 112 mcg by mouth daily before breakfast.     . lisinopril (PRINIVIL,ZESTRIL) 10 MG tablet Take 1 tablet (10 mg total) by mouth daily. (Patient taking differently: Take 10 mg by mouth 2 (two) times daily. ) 30 tablet 1  . Multiple Vitamin (MULTIVITAMIN) tablet Take 1 tablet by mouth every morning.     . Multiple Vitamins-Minerals (VISION FORMULA PO) Take 1 capsule by mouth daily.    . Polyvinyl Alcohol-Povidone (REFRESH OP) Apply 1 drop to eye daily as needed (DRY EYES).    . Triamcinolone Acetonide (NASACORT AQ NA) Place 1 spray into the nose at bedtime as needed (CONGESTION).    Marland Kitchen vitamin E (E-400) 400 UNIT capsule Take 400 Units by mouth every Monday, Wednesday, and Friday. Mon Wed Fri     No current facility-administered medications for this encounter.      REVIEW OF SYSTEMS: On review of systems, the patient reports that she is doing well overall. She reports soreness in the left chest since her surgery in 2016. She denies any chest pain, shortness of breath, cough, fevers, chills, night sweats, unintended weight changes. She denies any bowel or bladder disturbances, and denies abdominal pain, nausea or vomiting. She denies any new musculoskeletal or joint aches or pains. A complete review of systems is obtained and is otherwise negative.     PHYSICAL EXAM:  Wt Readings from Last 3 Encounters:  06/19/17 136 lb (61.7 kg)  06/18/17 136 lb 9.6 oz (62 kg)  06/11/17 136 lb 9.6 oz (62 kg)   Temp Readings from Last 3 Encounters:  06/19/17 97.8 F (36.6 C)  06/18/17 97.7 F (36.5 C)    11/23/16 97.6 F (36.4 C) (Oral)   BP Readings from Last 3 Encounters:  06/19/17 (!) 154/87  06/18/17 (!) 152/80  06/11/17 (!) 171/84   Pulse Readings from Last 3 Encounters:  06/19/17 70  06/18/17 78  06/11/17 67    In general this is a well appearing caucasian female in no acute distress. She is alert and oriented x4 and appropriate throughout the examination. HEENT reveals that the patient is normocephalic, atraumatic. EOMs are intact. PERRLA. Skin is intact without any evidence of gross lesions. Cardiovascular exam reveals a regular rate and rhythm, no clicks rubs or murmurs are auscultated. Chest is clear to auscultation bilaterally. Lymphatic assessment is performed and does not reveal any adenopathy in the cervical, supraclavicular, axillary, or inguinal chains. Abdomen has active bowel sounds in all quadrants and is intact. The abdomen is soft, non tender, non distended. Lower extremities are negative for pretibial pitting edema, deep calf tenderness, cyanosis or clubbing.   ECOG = 1  0 - Asymptomatic (Fully active, able to carry on all predisease activities without restriction)  1 - Symptomatic but completely ambulatory (Restricted in physically strenuous activity but ambulatory and able to carry out work of a light or sedentary nature. For example, light housework, office work)  2 - Symptomatic, <50% in bed during the day (Ambulatory and capable of all self care but unable to carry out any work activities. Up and about more than 50% of waking hours)  3 - Symptomatic, >50% in bed, but not bedbound (Capable of only limited self-care, confined to bed or chair 50% or more of waking hours)  4 - Bedbound (Completely disabled. Cannot carry on any self-care. Totally confined to bed or chair)  5 - Death   Eustace Pen MM, Creech RH, Tormey DC, et al. [phone]). "Toxicity and response criteria of the [facility]". Wanda Oncol. 5 (6): 649-55    LABORATORY DATA:   Lab Results  Component Value Date   WBC 4.7 06/18/2017   HGB 12.3 06/18/2017   HCT 38.2 06/18/2017   MCV 89.0 06/18/2017   PLT 152 06/18/2017   Lab Results  Component Value Date   NA 138 06/18/2017   K 3.7 06/18/2017   CL 103 06/18/2017   CO2 25 06/18/2017   Lab Results  Component Value Date   ALT 16 06/18/2017   AST 23 06/18/2017   ALKPHOS 72 06/18/2017   BILITOT 0.7 06/18/2017      RADIOGRAPHY: Dg Chest 2 View  Result Date: 06/18/2017 CLINICAL DATA:  Preoperative chest x-ray prior to video bronchoscopy. EXAM: CHEST  2 VIEW COMPARISON:  CT scan of the chest of June 10, 2017 and chest x-ray of December 14, 2014. FINDINGS: The lungs are mildly hyperinflated. The interstitial markings are coarse though stable. There is no alveolar infiltrate or pleural effusion. There is stable biapical pleural thickening. The heart and pulmonary vascularity are normal. The mediastinum is normal in width. There calcification in the wall of the thoracic aorta. There multilevel degenerative disc disease of the thoracic spine. IMPRESSION: Chronic bronchitic changes, stable. No pneumonia, CHF, nor other acute cardiopulmonary abnormality. Electronically Signed   By: David  Martinique M.D.   On: 06/18/2017 14:13   Ct Chest Wo Contrast  Result Date: 06/10/2017 CLINICAL DATA:  Status post left lower lobe superior segmentectomy 11/25/2014 for primary bronchogenic adenocarcinoma. Patient presents for follow-up of enlarging bandlike nodularity in the left lower lobe. EXAM: CT CHEST WITHOUT CONTRAST TECHNIQUE: Multidetector CT imaging of the chest was performed following the standard protocol without IV contrast. COMPARISON:  03/04/2017 PET-CT.  02/19/2017 chest CT. FINDINGS: Cardiovascular: Normal heart size. No significant pericardial fluid/thickening. Coronary atherosclerosis. Atherosclerotic nonaneurysmal thoracic aorta. Normal caliber pulmonary arteries. Mediastinum/Nodes: No discrete thyroid nodules. Unremarkable  esophagus. No pathologically enlarged axillary, mediastinal or gross hilar lymph nodes, noting limited sensitivity for the detection of hilar adenopathy on this noncontrast study. Lungs/Pleura: No pneumothorax. No pleural effusion. Status post superior segmentectomy in the left lower lobe. There is continued mild growth of the solid bandlike irregular nodularity in the anterior left lower lobe along the surgical sutures, which measures 1.8 x 1.1 cm on series 5/ image 64, mildly increased from 1.7 x 0.9 cm, and which measures 2.3 x 1.6 cm more centrally on series 5/ image 66, mildly increased from 2.0 x 1.2 cm. New mild patchy consolidation in the inferior right middle lobe superimposed on chronic patchy tree-in-bud opacities. New subsolid 5 mm right upper lobe pulmonary nodule (series 5/ image 39). Additional previously described subcentimeter scattered ground-glass pulmonary nodules in the bilateral upper lobes measuring up to 9 mm in the left upper lobe (series 5/image 30) are unchanged. Patchy radiation fibrosis in the anterior mid to upper right lung is stable. Upper abdomen: Unremarkable. Musculoskeletal: No aggressive appearing focal osseous lesions. Marked thoracic spondylosis. IMPRESSION: 1. Continued mild interval growth of irregular solid bandlike nodularity in the anterior left lower lobe along the segmentectomy suture line. Findings remain worrisome for slowly progressive local tumor recurrence. 2. New patchy consolidation superimposed on chronic tree-in-bud opacities in the inferior right middle lobe, nonspecific, more likely a mild  pneumonia. 3. New nonspecific subsolid 5 mm right upper lobe pulmonary nodule. Additional subcentimeter ground-glass bilateral upper lobe pulmonary nodules are stable. Attention recommended on follow-up chest CT in 3-6 months. 4. Coronary atherosclerosis. Aortic Atherosclerosis (ICD10-I70.0). Electronically Signed   By: Ilona Sorrel M.D.   On: 06/10/2017 18:11   Mm Diag  Breast Tomo Bilateral  Result Date: 06/17/2017 CLINICAL DATA:  84 year old female status post right lumpectomy in 2016. EXAM: 2D DIGITAL DIAGNOSTIC BILATERAL MAMMOGRAM WITH CAD AND ADJUNCT TOMO COMPARISON:  Previous exam(s). ACR Breast Density Category b: There are scattered areas of fibroglandular density. FINDINGS: Stable postoperative changes are demonstrated in the upper outer right breast posteriorly. No suspicious masses, calcifications or distortion is identified in either breast. Mammographic images were processed with CAD. IMPRESSION: 1. No mammographic evidence of malignancy in either breast. 2. Stable postoperative changes on the right. RECOMMENDATION: Diagnostic mammogram is suggested in 1 year. (Code:[id number]-01Y) I have discussed the findings and recommendations with the patient. Results were also provided in writing at the conclusion of the visit. If applicable, a reminder letter will be sent to the patient regarding the next appointment. BI-RADS CATEGORY  2: Benign. Electronically Signed   By: Kristopher Oppenheim M.D.   On: 06/17/2017 12:00   Dg C-arm Bronchoscopy  Result Date: 06/19/2017 C-ARM BRONCHOSCOPY: Fluoroscopy was utilized by the requesting physician.  No radiographic interpretation.       IMPRESSION/PLAN: 1. Recurrent Stage IA, pT1aN0 NSCLC, adenocarcinoma of the LLL. Dr. Lisbeth Renshaw discusses the pathology findings and reviews the nature of early stage lung cancer. The consensus from the thoracic conference included consideration of radiotherapy rather than additional resection. Dr. Lisbeth Renshaw reviews the rationale for treatment with SBRT style approach, though her recurrence is central and would benefit from more fractions. We discussed the risks, benefits, short, and long term effects of radiotherapy, and the patient is interested in proceeding. Dr. Lisbeth Renshaw discusses the delivery and logistics of radiotherapy and anticipates a course of 10 daily treatments to total 50 Gy. Our staff will contact  her to proceed with simulation and consent. 2. Stage IA, pT1bN0, grade 1 ER/PR positive invasive ductal carcinoma of the right breast. The patient will continue to follow up with Dr. Lucia Gaskins for continued evaluation. She is in agreement and her recent mammography this month appeared normal.   The above documentation reflects my direct findings during this shared patient visit. Please see the separate note by Dr. Lisbeth Renshaw on this date for the remainder of the patient's plan of care.    Carola Rhine, PAC

## 2017-06-27 NOTE — Therapy (Signed)
[place], [place], [address] Phone: [phone]   Fax:  [phone]  Physical Therapy Evaluation  Patient Details  Name: Melissa Snyder MRN: [id number] Date of Birth: January 22, 1934 Referring Provider: Dr. Kyung Rudd   Encounter Date: 06/27/2017  PT End of Session - 06/27/17 1528    Visit Number  1    Number of Visits  1    PT Start Time  1448    PT Stop Time  1509    PT Time Calculation (min)  21 min    Activity Tolerance  Patient tolerated treatment well    Behavior During Therapy  [facility] for tasks assessed/performed       Past Medical History:  Diagnosis Date  . Blind left eye 10/2013   cva  . Breast cancer ([place]) 05/2014   ER+/PR+/Her2-     LUNG CANCER  . Bundle branch block left   . Chest pain   . Family history of bladder cancer   . Family history of breast cancer   . Family history of prostate cancer   . GERD (gastroesophageal reflux disease)   . History of blood transfusion    age 30  . Hyperlipidemia   . Hypertension   . Hypothyroid   . Lung nodule   . Melanoma in situ of face ([place]) JUNE 2015   LEFT CHEEK  . Meningioma ([place])    brain lining  . Migraine    reports only having one  . Osteoarthritis   . Personal history of radiation therapy   . Radiation 09/16/14-10/07/14   Right  Breast  21 fractions  . Skin cancer   . Stroke [facility]) 2015   Blind in left eye as a result    Past Surgical History:  Procedure Laterality Date  . ABDOMINAL HYSTERECTOMY  ~ 1997  . APPENDECTOMY     age 33  . BILATERAL SALPINGOOPHORECTOMY    . BLADDER REPAIR  2009   cystocele  . BREAST BIOPSY    . BREAST LUMPECTOMY Right   . BUNIONECTOMY     bilateral great toe  . CARDIAC CATHETERIZATION  2004  . CHOLECYSTECTOMY  ~ 1997  . COLONOSCOPY    . CRYO INTERCOSTAL NERVE BLOCK Left 11/25/2014   Procedure: CRYO INTERCOSTAL NERVE BLOCK;  Surgeon: [name], MD;  Location: [place];  Service: Thoracic;   Laterality: Left;  . CT RADIATION THERAPY GUIDE     Gamma radiation -lt frontal-[facility]  . DILATION AND CURETTAGE OF UTERUS     X 2  . MELANOMA EXCISION Left 11/05/23, 11/30/13   CHEEK  . OTHER SURGICAL HISTORY     Gamma knife to Meningioma in brain lining  . SEGMENTECOMY Left 11/25/2014   Procedure: LEFT LOWER LOBE SEGMENTECTOMY;  Surgeon: [name], MD;  Location: [place];  Service: Thoracic;  Laterality: Left;  Marland Kitchen VIDEO ASSISTED THORACOSCOPY Left 11/25/2014   Procedure: VIDEO ASSISTED THORACOSCOPY;  Surgeon: [name], MD;  Location: [place];  Service: Thoracic;  Laterality: Left;  Marland Kitchen VIDEO BRONCHOSCOPY WITH ENDOBRONCHIAL NAVIGATION N/A 06/19/2017   Procedure: VIDEO BRONCHOSCOPY;  Surgeon: [name], MD;  Location: [place];  Service: Thoracic;  Laterality: N/A;  . VIDEO BRONCHOSCOPY WITH ENDOBRONCHIAL ULTRASOUND N/A 11/25/2014   Procedure: VIDEO BRONCHOSCOPY WITH ENDOBRONCHIAL ULTRASOUND;  Surgeon: [name], MD;  Location: [place];  Service: Thoracic;  Laterality: N/A;    There were no vitals filed for this visit.   Subjective  Assessment - 06/27/17 1511    Subjective  I still have soreness from lung surgery 2 and a half years ago; I would think that would be gone by now.    Patient is accompained by:  Family member    Pertinent History  Pt. had been followed due to h/o NSCLC left lower lobe, which was stage IA and treated with superior segmentectomy in June 2016.  Now with left lower lobe enlarging nodule on scar from previous surgery.  She expects to have SBRT treatments x 10. She also had breast cancer stage IA diagnosed 2015 with lumpectomy Feb. 2016, radiation, and anti-estrogen therapy. Melanoma of cheek? s/p excision.    Patient Stated Goals  get info from all lung clinic providers    Currently in Pain?  Yes    Pain Score  2     Pain Location  Flank    Pain Orientation  Left    Pain Descriptors / Indicators  Sore;Other (Comment) "aware that it's  there"    Pain Type  Surgical pain;Chronic pain    Pain Onset  More than a month ago    Pain Frequency  Constant    Aggravating Factors   touching it    Pain Relieving Factors  nothing         [facility] PT Assessment - 06/27/17 0001      Assessment   Medical Diagnosis  LLL adenocarcinoma recurrence    Referring Provider  Dr. Kyung Rudd    Onset Date/Surgical Date  02/19/17    Hand Dominance  Left    Prior Therapy  none      Precautions   Precautions  Other (comment)    Precaution Comments  cancer precautions      Restrictions   Weight Bearing Restrictions  No      Balance Screen   Has the patient fallen in the past 6 months  No    Has the patient had a decrease in activity level because of a fear of falling?   No    Is the patient reluctant to leave their home because of a fear of falling?   No      Home Film/video editor residence    Living Arrangements  Spouse/significant other    Type of [place]  One level      Prior Function   Level of [employer]  [facility] [employer] [facility] (though hasn't just very recently); group exercise lasts 45 minutes      Cognition   Overall Cognitive Status  Within Functional Limits for tasks assessed      Observation/Other Assessments   Observations  tall, slender woman accompanied by her husband      Coordination   Gross Motor Movements are Fluid and Coordinated  Yes      Functional Tests   Functional tests  Sit to Stand      Sit to Stand   Comments  7 times in 30 seconds she reports her knees were at their limit with this      Posture/Postural Control   Posture/Postural Control  Postural limitations    Postural Limitations  Forward head;Increased thoracic kyphosis      ROM / Strength   AROM / PROM / Strength  AROM      AROM   Overall AROM Comments  in standing, active trunk flexion: reaches  10 inches fingertips to floor; extension 60%  loss; sidebend 50% loss bilat. with LBP; rotation 10% loss bilat.      Ambulation/Gait   Ambulation/Gait  Yes    Ambulation/Gait Assistance  7: Independent      Balance   Balance Assessed  Yes      Dynamic Standing Balance   Dynamic Standing - Comments  reaches forward 9 inches in standing, a little below average for age             Objective measurements completed on examination: See above findings.              PT Education - 06/27/17 1527    Education provided  Yes    Education Details  staying active with [employer], CURE article on staying active, "Why exercise?" flyer, energy conservation, posture, breathing, PT info    Person(s) Educated  Patient;Spouse    Methods  Explanation;Handout    Comprehension  Verbalized understanding            Lung Clinic Goals - 06/27/17 1534      Patient will be able to verbalize understanding of the benefit of exercise to decrease fatigue.   Status  Achieved      Patient will be able to verbalize the importance of posture.   Status  Achieved      Patient will be able to demonstrate diaphragmatic breathing for improved lung function.   Status  Achieved      Patient will be able to verbalize understanding of the role of physical therapy to prevent functional decline and who to contact if physical therapy is needed.   Status  Achieved           Plan - 06/27/17 1528    Clinical Impression Statement  This is an 84 year-old woman with h/o lung segmentectomy for stage IA adenocarcinoma in June 2016, now with an enlarging nodule at scar on lung from that surgery. She also has a h/o breast cancer s/p lumpectomy in February 2016.  She expects to have SBRT for current episode.    History and Personal Factors relevant to plan of care:  prior h/o lung cancer and breast cancer; possibly also a melanoma that was excised    Clinical Presentation  Evolving    Clinical Presentation due to:  new cancer diagnosis and will  undergo treatment for that soon    Clinical Decision Making  Moderate    Rehab Potential  Good    PT Frequency  One time visit    PT Treatment/Interventions  Patient/family education    PT Next Visit Plan  No follow-up planned currently, but once patient has completed SBRT, she may benefit from therapy to try to reduce her left flank pain that is ongoing since surgery in June 2016 for lung segementectomy.    PT Home Exercise Plan  continue Silver Sneakers, breathing exericse    Consulted and Agree with Plan of Care  Patient       Patient will benefit from skilled therapeutic intervention in order to improve the following deficits and impairments:  Decreased range of motion, Pain, Decreased mobility, Postural dysfunction  Visit Diagnosis: Other symptoms and signs involving the musculoskeletal system - Plan: PT plan of care cert/re-cert  Abnormal posture - Plan: PT plan of care cert/re-cert     Problem List Patient Active Problem List   Diagnosis Date Noted  . Primary cancer of left lower lobe of lung ([place]) 11/25/2014  . Genetic  testing 07/01/2014  . Family history of breast cancer   . Family history of bladder cancer   . Family history of prostate cancer   . Breast cancer of upper-outer quadrant of right female breast ([place]) 05/04/2014  . Hypothyroid   . Hypertension   . Arthritis   . Hyperlipidemia 01/12/2014  . Melanoma in situ of face ([place]) 11/02/2013  . Central retinal artery occlusion of left eye 10/24/2013  . HTN (hypertension) 10/24/2013  . Hypothyroidism 10/24/2013  . GERD (gastroesophageal reflux disease) 10/24/2013    SALISBURY,DONNA 06/27/2017, 3:36 PM  [place], [place], [address] Phone: [phone]   Fax:  [phone]  Name: Melissa Snyder MRN: [id number] Date of Birth: May 15, 1934  Serafina Royals, PT 06/27/17 3:36 PM

## 2017-07-02 ENCOUNTER — Other Ambulatory Visit: Payer: Self-pay

## 2017-07-02 ENCOUNTER — Ambulatory Visit
Admission: RE | Admit: 2017-07-02 | Discharge: 2017-07-02 | Disposition: A | Discharge status: Home / Self Care | Payer: Medicare HMO | Source: Ambulatory Visit | Attending: Radiation Oncology | Admitting: Radiation Oncology

## 2017-07-02 DIAGNOSIS — E785 Hyperlipidemia, unspecified: Secondary | ICD-10-CM | POA: Diagnosis not present

## 2017-07-02 DIAGNOSIS — C3432 Malignant neoplasm of lower lobe, left bronchus or lung: Secondary | ICD-10-CM | POA: Diagnosis not present

## 2017-07-02 DIAGNOSIS — K219 Gastro-esophageal reflux disease without esophagitis: Secondary | ICD-10-CM | POA: Diagnosis not present

## 2017-07-02 DIAGNOSIS — Z8673 Personal history of transient ischemic attack (TIA), and cerebral infarction without residual deficits: Secondary | ICD-10-CM | POA: Diagnosis not present

## 2017-07-02 DIAGNOSIS — Z8052 Family history of malignant neoplasm of bladder: Secondary | ICD-10-CM | POA: Diagnosis not present

## 2017-07-02 DIAGNOSIS — Z510 Encounter for antineoplastic radiation therapy: Secondary | ICD-10-CM | POA: Diagnosis not present

## 2017-07-02 DIAGNOSIS — Z853 Personal history of malignant neoplasm of breast: Secondary | ICD-10-CM | POA: Diagnosis not present

## 2017-07-02 DIAGNOSIS — R918 Other nonspecific abnormal finding of lung field: Secondary | ICD-10-CM | POA: Diagnosis not present

## 2017-07-02 DIAGNOSIS — Z803 Family history of malignant neoplasm of breast: Secondary | ICD-10-CM | POA: Diagnosis not present

## 2017-07-02 DIAGNOSIS — I10 Essential (primary) hypertension: Secondary | ICD-10-CM | POA: Diagnosis not present

## 2017-07-02 DIAGNOSIS — D320 Benign neoplasm of cerebral meninges: Secondary | ICD-10-CM | POA: Diagnosis not present

## 2017-07-02 DIAGNOSIS — M1990 Unspecified osteoarthritis, unspecified site: Secondary | ICD-10-CM | POA: Diagnosis not present

## 2017-07-02 DIAGNOSIS — Z85820 Personal history of malignant melanoma of skin: Secondary | ICD-10-CM | POA: Diagnosis not present

## 2017-07-02 DIAGNOSIS — Z8042 Family history of malignant neoplasm of prostate: Secondary | ICD-10-CM | POA: Diagnosis not present

## 2017-07-02 DIAGNOSIS — Z923 Personal history of irradiation: Secondary | ICD-10-CM | POA: Diagnosis not present

## 2017-07-02 DIAGNOSIS — E039 Hypothyroidism, unspecified: Secondary | ICD-10-CM | POA: Diagnosis not present

## 2017-07-02 NOTE — Progress Notes (Signed)
Patient denies having a pacemaker. Patient denies taking methotrexate. Patient reports a hx of previous radiation to her right breast given by Dr. Pablo Ledger in 2016.

## 2017-07-03 ENCOUNTER — Encounter (HOSPITAL_COMMUNITY): Payer: Self-pay

## 2017-07-03 ENCOUNTER — Emergency Department (HOSPITAL_COMMUNITY): Payer: Medicare HMO

## 2017-07-03 ENCOUNTER — Inpatient Hospital Stay (HOSPITAL_COMMUNITY)
Admission: EM | Admit: 2017-07-03 | Discharge: 2017-07-06 | DRG: 470 | Disposition: A | Discharge status: Skilled Nursing Facility | Payer: Medicare HMO | Attending: Internal Medicine | Admitting: Internal Medicine

## 2017-07-03 ENCOUNTER — Other Ambulatory Visit: Payer: Self-pay

## 2017-07-03 DIAGNOSIS — H5462 Unqualified visual loss, left eye, normal vision right eye: Secondary | ICD-10-CM | POA: Diagnosis present

## 2017-07-03 DIAGNOSIS — S72009D Fracture of unspecified part of neck of unspecified femur, subsequent encounter for closed fracture with routine healing: Secondary | ICD-10-CM | POA: Diagnosis not present

## 2017-07-03 DIAGNOSIS — E785 Hyperlipidemia, unspecified: Secondary | ICD-10-CM | POA: Diagnosis not present

## 2017-07-03 DIAGNOSIS — T148XXA Other injury of unspecified body region, initial encounter: Secondary | ICD-10-CM | POA: Diagnosis not present

## 2017-07-03 DIAGNOSIS — Z90710 Acquired absence of both cervix and uterus: Secondary | ICD-10-CM

## 2017-07-03 DIAGNOSIS — W000XXA Fall on same level due to ice and snow, initial encounter: Secondary | ICD-10-CM | POA: Diagnosis not present

## 2017-07-03 DIAGNOSIS — D62 Acute posthemorrhagic anemia: Secondary | ICD-10-CM | POA: Diagnosis not present

## 2017-07-03 DIAGNOSIS — Z923 Personal history of irradiation: Secondary | ICD-10-CM

## 2017-07-03 DIAGNOSIS — E039 Hypothyroidism, unspecified: Secondary | ICD-10-CM | POA: Diagnosis not present

## 2017-07-03 DIAGNOSIS — I447 Left bundle-branch block, unspecified: Secondary | ICD-10-CM | POA: Diagnosis present

## 2017-07-03 DIAGNOSIS — M549 Dorsalgia, unspecified: Secondary | ICD-10-CM | POA: Diagnosis not present

## 2017-07-03 DIAGNOSIS — S72092A Other fracture of head and neck of left femur, initial encounter for closed fracture: Secondary | ICD-10-CM | POA: Diagnosis not present

## 2017-07-03 DIAGNOSIS — I69398 Other sequelae of cerebral infarction: Secondary | ICD-10-CM

## 2017-07-03 DIAGNOSIS — I10 Essential (primary) hypertension: Secondary | ICD-10-CM | POA: Diagnosis not present

## 2017-07-03 DIAGNOSIS — Z853 Personal history of malignant neoplasm of breast: Secondary | ICD-10-CM

## 2017-07-03 DIAGNOSIS — S72402D Unspecified fracture of lower end of left femur, subsequent encounter for closed fracture with routine healing: Secondary | ICD-10-CM | POA: Diagnosis not present

## 2017-07-03 DIAGNOSIS — S92153A Displaced avulsion fracture (chip fracture) of unspecified talus, initial encounter for closed fracture: Secondary | ICD-10-CM | POA: Diagnosis not present

## 2017-07-03 DIAGNOSIS — R252 Cramp and spasm: Secondary | ICD-10-CM | POA: Diagnosis not present

## 2017-07-03 DIAGNOSIS — M6281 Muscle weakness (generalized): Secondary | ICD-10-CM | POA: Diagnosis not present

## 2017-07-03 DIAGNOSIS — Z801 Family history of malignant neoplasm of trachea, bronchus and lung: Secondary | ICD-10-CM | POA: Diagnosis not present

## 2017-07-03 DIAGNOSIS — Z885 Allergy status to narcotic agent status: Secondary | ICD-10-CM

## 2017-07-03 DIAGNOSIS — C3432 Malignant neoplasm of lower lobe, left bronchus or lung: Secondary | ICD-10-CM | POA: Diagnosis not present

## 2017-07-03 DIAGNOSIS — Z96642 Presence of left artificial hip joint: Secondary | ICD-10-CM | POA: Diagnosis not present

## 2017-07-03 DIAGNOSIS — Z888 Allergy status to other drugs, medicaments and biological substances status: Secondary | ICD-10-CM | POA: Diagnosis not present

## 2017-07-03 DIAGNOSIS — D0220 Carcinoma in situ of unspecified bronchus and lung: Secondary | ICD-10-CM | POA: Diagnosis not present

## 2017-07-03 DIAGNOSIS — Z8781 Personal history of (healed) traumatic fracture: Secondary | ICD-10-CM

## 2017-07-03 DIAGNOSIS — Z8042 Family history of malignant neoplasm of prostate: Secondary | ICD-10-CM

## 2017-07-03 DIAGNOSIS — H3412 Central retinal artery occlusion, left eye: Secondary | ICD-10-CM | POA: Diagnosis not present

## 2017-07-03 DIAGNOSIS — Z471 Aftercare following joint replacement surgery: Secondary | ICD-10-CM | POA: Diagnosis not present

## 2017-07-03 DIAGNOSIS — K219 Gastro-esophageal reflux disease without esophagitis: Secondary | ICD-10-CM | POA: Diagnosis present

## 2017-07-03 DIAGNOSIS — G8911 Acute pain due to trauma: Secondary | ICD-10-CM | POA: Diagnosis not present

## 2017-07-03 DIAGNOSIS — Z7982 Long term (current) use of aspirin: Secondary | ICD-10-CM

## 2017-07-03 DIAGNOSIS — Z808 Family history of malignant neoplasm of other organs or systems: Secondary | ICD-10-CM

## 2017-07-03 DIAGNOSIS — Z902 Acquired absence of lung [part of]: Secondary | ICD-10-CM

## 2017-07-03 DIAGNOSIS — Z79899 Other long term (current) drug therapy: Secondary | ICD-10-CM | POA: Diagnosis not present

## 2017-07-03 DIAGNOSIS — Z8052 Family history of malignant neoplasm of bladder: Secondary | ICD-10-CM

## 2017-07-03 DIAGNOSIS — S72002A Fracture of unspecified part of neck of left femur, initial encounter for closed fracture: Secondary | ICD-10-CM | POA: Diagnosis not present

## 2017-07-03 DIAGNOSIS — M25559 Pain in unspecified hip: Secondary | ICD-10-CM | POA: Diagnosis not present

## 2017-07-03 DIAGNOSIS — Z8249 Family history of ischemic heart disease and other diseases of the circulatory system: Secondary | ICD-10-CM

## 2017-07-03 DIAGNOSIS — S32402A Unspecified fracture of left acetabulum, initial encounter for closed fracture: Secondary | ICD-10-CM

## 2017-07-03 DIAGNOSIS — S72002D Fracture of unspecified part of neck of left femur, subsequent encounter for closed fracture with routine healing: Secondary | ICD-10-CM | POA: Diagnosis not present

## 2017-07-03 DIAGNOSIS — Z85820 Personal history of malignant melanoma of skin: Secondary | ICD-10-CM | POA: Diagnosis not present

## 2017-07-03 DIAGNOSIS — Z90722 Acquired absence of ovaries, bilateral: Secondary | ICD-10-CM

## 2017-07-03 DIAGNOSIS — C50411 Malignant neoplasm of upper-outer quadrant of right female breast: Secondary | ICD-10-CM | POA: Diagnosis not present

## 2017-07-03 DIAGNOSIS — Z803 Family history of malignant neoplasm of breast: Secondary | ICD-10-CM

## 2017-07-03 DIAGNOSIS — Z91018 Allergy to other foods: Secondary | ICD-10-CM

## 2017-07-03 DIAGNOSIS — Z79890 Hormone replacement therapy (postmenopausal): Secondary | ICD-10-CM

## 2017-07-03 DIAGNOSIS — S299XXA Unspecified injury of thorax, initial encounter: Secondary | ICD-10-CM | POA: Diagnosis not present

## 2017-07-03 DIAGNOSIS — Y9289 Other specified places as the place of occurrence of the external cause: Secondary | ICD-10-CM

## 2017-07-03 DIAGNOSIS — S72009A Fracture of unspecified part of neck of unspecified femur, initial encounter for closed fracture: Secondary | ICD-10-CM

## 2017-07-03 HISTORY — DX: Malignant (primary) neoplasm, unspecified: C80.1

## 2017-07-03 LAB — CBC WITH DIFFERENTIAL/PLATELET
Basophils Absolute: 0 10*3/uL (ref 0.0–0.1)
Basophils Relative: 0 %
EOS ABS: 0.2 10*3/uL (ref 0.0–0.7)
Eosinophils Relative: 2 %
HEMATOCRIT: 36.6 % (ref 36.0–46.0)
HEMOGLOBIN: 12.3 g/dL (ref 12.0–15.0)
LYMPHS ABS: 0.7 10*3/uL (ref 0.7–4.0)
LYMPHS PCT: 9 %
MCH: 29.1 pg (ref 26.0–34.0)
MCHC: 33.6 g/dL (ref 30.0–36.0)
MCV: 86.7 fL (ref 78.0–100.0)
Monocytes Absolute: 0.3 10*3/uL (ref 0.1–1.0)
Monocytes Relative: 4 %
NEUTROS ABS: 6.8 10*3/uL (ref 1.7–7.7)
NEUTROS PCT: 85 %
Platelets: 157 10*3/uL (ref 150–400)
RBC: 4.22 MIL/uL (ref 3.87–5.11)
RDW: 13.8 % (ref 11.5–15.5)
WBC: 8 10*3/uL (ref 4.0–10.5)

## 2017-07-03 LAB — TYPE AND SCREEN
ABO/RH(D): B NEG
ANTIBODY SCREEN: NEGATIVE

## 2017-07-03 LAB — PROTIME-INR
INR: 0.96
Prothrombin Time: 12.7 s (ref 11.4–15.2)

## 2017-07-03 LAB — BASIC METABOLIC PANEL WITH GFR
Anion gap: 7 (ref 5–15)
BUN: 16 mg/dL (ref 6–20)
CO2: 29 mmol/L (ref 22–32)
Calcium: 9.3 mg/dL (ref 8.9–10.3)
Chloride: 104 mmol/L (ref 101–111)
Creatinine, Ser: 0.56 mg/dL (ref 0.44–1.00)
GFR calc Af Amer: >60 mL/min
GFR calc non Af Amer: >60 mL/min
Glucose, Bld: 95 mg/dL (ref 65–99)
Potassium: 3.8 mmol/L (ref 3.5–5.1)
Sodium: 140 mmol/L (ref 135–145)

## 2017-07-03 LAB — URINALYSIS, ROUTINE W REFLEX MICROSCOPIC
BILIRUBIN URINE: NEGATIVE
Glucose, UA: NEGATIVE mg/dL
HGB URINE DIPSTICK: NEGATIVE
KETONES UR: NEGATIVE mg/dL
Leukocytes, UA: NEGATIVE
Nitrite: NEGATIVE
Protein, ur: NEGATIVE mg/dL
Specific Gravity, Urine: 1.004 — ABNORMAL LOW (ref 1.005–1.030)
pH: 8 (ref 5.0–8.0)

## 2017-07-03 LAB — TSH: TSH: 2.051 u[IU]/mL (ref 0.350–4.500)

## 2017-07-03 LAB — MAGNESIUM: Magnesium: 1.9 mg/dL (ref 1.7–2.4)

## 2017-07-03 MED ORDER — CHLORHEXIDINE GLUCONATE 4 % EX LIQD: 60.0000 mL | Freq: Once | CUTANEOUS | Status: DC

## 2017-07-03 MED ORDER — SENNA 8.6 MG PO TABS
1.0000 | ORAL_TABLET | Freq: Two times a day (BID) | ORAL | Status: DC
  Administered 2017-07-03 – 2017-07-06 (×6): 8.6 mg via ORAL
  Filled 2017-07-03 (×6): qty 1

## 2017-07-03 MED ORDER — DOCUSATE SODIUM 100 MG PO CAPS
100.0000 mg | ORAL_CAPSULE | Freq: Two times a day (BID) | ORAL | Status: DC
  Administered 2017-07-03 – 2017-07-06 (×5): 100 mg via ORAL
  Filled 2017-07-03 (×5): qty 1

## 2017-07-03 MED ORDER — ONDANSETRON HCL 4 MG PO TABS: 4.0000 mg | ORAL_TABLET | Freq: Four times a day (QID) | ORAL | Status: DC | PRN

## 2017-07-03 MED ORDER — VITAMIN D 1000 UNITS PO TABS
2000.0000 [IU] | ORAL_TABLET | Freq: Every day | ORAL | Status: DC
  Administered 2017-07-04 – 2017-07-06 (×3): 2000 [IU] via ORAL
  Filled 2017-07-03 (×3): qty 2

## 2017-07-03 MED ORDER — ENOXAPARIN SODIUM 40 MG/0.4ML ~~LOC~~ SOLN: 40.0000 mg | SUBCUTANEOUS | Status: AC

## 2017-07-03 MED ORDER — ACETAMINOPHEN 650 MG RE SUPP: 650.0000 mg | Freq: Four times a day (QID) | RECTAL | Status: DC | PRN

## 2017-07-03 MED ORDER — ATENOLOL 50 MG PO TABS
50.0000 mg | ORAL_TABLET | Freq: Every day | ORAL | Status: DC
  Administered 2017-07-03 – 2017-07-05 (×3): 50 mg via ORAL
  Filled 2017-07-03 (×3): qty 1

## 2017-07-03 MED ORDER — POVIDONE-IODINE 10 % EX SWAB: 2.0000 "application " | Freq: Once | CUTANEOUS | Status: DC

## 2017-07-03 MED ORDER — ACETAMINOPHEN 325 MG PO TABS
650.0000 mg | ORAL_TABLET | Freq: Four times a day (QID) | ORAL | Status: DC | PRN
  Administered 2017-07-04: 650 mg via ORAL
  Filled 2017-07-03 (×2): qty 2

## 2017-07-03 MED ORDER — LISINOPRIL 10 MG PO TABS
10.0000 mg | ORAL_TABLET | Freq: Every day | ORAL | Status: DC
  Administered 2017-07-04 – 2017-07-06 (×3): 10 mg via ORAL
  Filled 2017-07-03 (×3): qty 1

## 2017-07-03 MED ORDER — SODIUM CHLORIDE 0.9 % IV SOLN
INTRAVENOUS | Status: DC
  Administered 2017-07-03: 21:00:00 via INTRAVENOUS

## 2017-07-03 MED ORDER — LEVOTHYROXINE SODIUM 112 MCG PO TABS
112.0000 ug | ORAL_TABLET | Freq: Every day | ORAL | Status: DC
  Administered 2017-07-04 – 2017-07-06 (×3): 112 ug via ORAL
  Filled 2017-07-03 (×3): qty 1

## 2017-07-03 MED ORDER — FENTANYL CITRATE (PF) 100 MCG/2ML IJ SOLN
25.0000 ug | INTRAMUSCULAR | Status: AC | PRN
  Administered 2017-07-03 (×3): 25 ug via INTRAVENOUS
  Filled 2017-07-03 (×3): qty 2

## 2017-07-03 MED ORDER — VITAMIN D 50 MCG (2000 UT) PO TABS: 2000.0000 [IU] | ORAL_TABLET | Freq: Every day | ORAL | Status: DC

## 2017-07-03 MED ORDER — HYDRALAZINE HCL 20 MG/ML IJ SOLN
5.0000 mg | Freq: Four times a day (QID) | INTRAMUSCULAR | Status: DC | PRN
  Administered 2017-07-05: 5 mg via INTRAVENOUS
  Filled 2017-07-03: qty 1

## 2017-07-03 MED ORDER — ONDANSETRON HCL 4 MG/2ML IJ SOLN
4.0000 mg | Freq: Four times a day (QID) | INTRAMUSCULAR | Status: DC | PRN
  Administered 2017-07-03: 4 mg via INTRAVENOUS
  Filled 2017-07-03: qty 2

## 2017-07-03 NOTE — H&P (View-Only) (Signed)
Reason for Consult:left hip fracture Referring Physician: Breuna Loveall is an 84 y.o. female.  HPI: 84 year old active [place] [facility] and [place]. She had immediate concern of left hip pain.  Denies other injury other than a scrape on her hand.  She was unable to ambulate and was taken to the [facility] long emergency department where she was found to have a left displaced femoral neck fracture.  She is currently being treated for recurrent lung cancer and was set up to start radiation soon.  She lives independently with her husband and previously was not using any assistance for ambulation.  At this point she complains of left hip pain, severe with movement, better with rest.  Past Medical History:  Diagnosis Date  . Blind left eye 10/2013   cva  . Breast cancer ([place]) 05/2014   ER+/PR+/Her2-     LUNG CANCER  . Bundle branch block left   . Chest pain   . Family history of bladder cancer   . Family history of breast cancer   . Family history of prostate cancer   . GERD (gastroesophageal reflux disease)   . History of blood transfusion    age 37  . Hyperlipidemia   . Hypertension   . Hypothyroid   . Lung nodule   . Melanoma in situ of face ([place]) JUNE 2015   LEFT CHEEK  . Meningioma ([place])    brain lining  . Migraine    reports only having one  . Osteoarthritis   . Personal history of radiation therapy   . Radiation 09/16/14-10/07/14   Right  Breast  21 fractions  . Skin cancer   . Stroke [facility]) 2015   Blind in left eye as a result    Past Surgical History:  Procedure Laterality Date  . ABDOMINAL HYSTERECTOMY  ~ 1997  . APPENDECTOMY     age 40  . BILATERAL SALPINGOOPHORECTOMY    . BLADDER REPAIR  2009   cystocele  . BREAST BIOPSY    . BREAST LUMPECTOMY Right   . BUNIONECTOMY     bilateral great toe  . CARDIAC CATHETERIZATION  2004  . CHOLECYSTECTOMY  ~ 1997  . COLONOSCOPY    . CRYO INTERCOSTAL NERVE BLOCK Left 11/25/2014   Procedure: CRYO  INTERCOSTAL NERVE BLOCK;  Surgeon: [name], MD;  Location: [place];  Service: Thoracic;  Laterality: Left;  . CT RADIATION THERAPY GUIDE     Gamma radiation -lt frontal-[facility]  . DILATION AND CURETTAGE OF UTERUS     X 2  . MELANOMA EXCISION Left 11/05/23, 11/30/13   CHEEK  . OTHER SURGICAL HISTORY     Gamma knife to Meningioma in brain lining  . SEGMENTECOMY Left 11/25/2014   Procedure: LEFT LOWER LOBE SEGMENTECTOMY;  Surgeon: [name], MD;  Location: [place];  Service: Thoracic;  Laterality: Left;  Marland Kitchen VIDEO ASSISTED THORACOSCOPY Left 11/25/2014   Procedure: VIDEO ASSISTED THORACOSCOPY;  Surgeon: [name], MD;  Location: [place];  Service: Thoracic;  Laterality: Left;  Marland Kitchen VIDEO BRONCHOSCOPY WITH ENDOBRONCHIAL NAVIGATION N/A 06/19/2017   Procedure: VIDEO BRONCHOSCOPY;  Surgeon: [name], MD;  Location: [place];  Service: Thoracic;  Laterality: N/A;  . VIDEO BRONCHOSCOPY WITH ENDOBRONCHIAL ULTRASOUND N/A 11/25/2014   Procedure: VIDEO BRONCHOSCOPY WITH ENDOBRONCHIAL ULTRASOUND;  Surgeon: [name], MD;  Location: [facility] OR;  Service: Thoracic;  Laterality: N/A;    Family History  Problem Relation Age  of Onset  . CAD Mother        CABG  . Hyperlipidemia Mother   . AAA (abdominal aortic aneurysm) Mother   . Renal Disease Father   . Kidney disease Father   . CAD Father   . Cancer Father        PROSTATE  . Diabetes Brother   . Hyperlipidemia Brother   . Breast cancer Daughter 31  . Breast cancer Sister 42  . Breast cancer Sister 59  . Cancer Brother        NOS  . Bladder Cancer Brother 70  . Lung cancer Cousin        3 paternal cousins with lung cancer  . Breast cancer Cousin        five paternal first cousins  . Cancer Cousin        4 paternal first cousins with Cancer NOS  . Cancer Cousin        1 paternal cousin with oral cancer (tongue)    Social History:  reports that  has never smoked. she has never used smokeless tobacco. She  reports that she does not drink alcohol or use drugs.  Allergies:  Allergies  Allergen Reactions  . Beef-Derived Products Other (See Comments)    ACID REFLUX  . Chocolate     Acid Reflux  . Other     FRIED FOODS  . Hydrocodone Other (See Comments)    INSOMNIA HYPERACTIVITY  . Ibuprofen Rash and Other (See Comments)    Causes mouth sores  . Niacin And Related Other (See Comments)    Flushing, burning, redness  . Tramadol Itching    Medications: I have reviewed the patient's current medications.  Results for orders placed or performed during the hospital encounter of 07/03/17 (from the past 48 hour(s))  CBC WITH DIFFERENTIAL     Status: None   Collection Time: 07/03/17 12:14 PM  Result Value Ref Range   WBC 8.0 4.0 - 10.5 K/uL   RBC 4.22 3.87 - 5.11 MIL/uL   Hemoglobin 12.3 12.0 - 15.0 g/dL   HCT 36.6 36.0 - 46.0 %   MCV 86.7 78.0 - 100.0 fL   MCH 29.1 26.0 - 34.0 pg   MCHC 33.6 30.0 - 36.0 g/dL   RDW 13.8 11.5 - 15.5 %   Platelets 157 150 - 400 K/uL   Neutrophils Relative % 85 %   Neutro Abs 6.8 1.7 - 7.7 K/uL   Lymphocytes Relative 9 %   Lymphs Abs 0.7 0.7 - 4.0 K/uL   Monocytes Relative 4 %   Monocytes Absolute 0.3 0.1 - 1.0 K/uL   Eosinophils Relative 2 %   Eosinophils Absolute 0.2 0.0 - 0.7 K/uL   Basophils Relative 0 %   Basophils Absolute 0.0 0.0 - 0.1 K/uL    Dg Chest 2 View  Result Date: 07/03/2017 CLINICAL DATA:  Fall today, LEFT hip pain. EXAM: CHEST  2 VIEW COMPARISON:  06/18/2017. FINDINGS: Partial exclusion of the RIGHT lung base. Unremarkable cardiomediastinal silhouette. No consolidation or edema. No effusion or pneumothorax. Skeletal osteopenia. IMPRESSION: No active cardiopulmonary disease.Stable chest. Electronically Signed   By: Staci Righter M.D.   On: 07/03/2017 10:45   Dg Hip Unilat With Pelvis 2-3 Views Left  Result Date: 07/03/2017 CLINICAL DATA:  84 year old female status post fall today with left hip pain. Breast and lung cancer. EXAM: DG  HIP (WITH OR WITHOUT PELVIS) 2-3V LEFT COMPARISON:  PET-CT 03/04/2017 FINDINGS: Left femoral neck fracture with  mild impaction and cephalad displacement. The left femoral head remains normally located. The intertrochanteric segment of the left femur appears intact. The pelvis appears intact. The proximal right femur appears grossly intact. Bone mineralization is within normal limits for age. Iliofemoral calcified atherosclerosis. Negative visible bowel gas pattern. IMPRESSION: Acute left femoral neck fracture with varus impaction. Electronically Signed   By: Genevie Ann M.D.   On: 07/03/2017 10:47    Review of Systems  All other systems reviewed and are negative.  Blood pressure (!) 173/73, pulse 74, temperature 97.8 F (36.6 C), temperature source Oral, resp. rate 20, SpO2 98 %. Physical Exam  Constitutional: She is oriented to person, place, and time. She appears well-developed and well-nourished.  HENT:  Head: Atraumatic.  Eyes: EOM are normal.  Cardiovascular: Intact distal pulses.  Respiratory: Effort normal.  Musculoskeletal:  LLE  Shortened and externally rotated. Pain with any motion of hip. No TTP about knee.  Distally NVI.  Neurological: She is alert and oriented to person, place, and time.  Skin: Skin is warm and dry.  Psychiatric: She has a normal mood and affect.    Assessment/Plan: Left displaced femoral neck fracture Plan left hip hemiarthroplasty The patient will be admitted to the hospitalist service and transferred to [facility] for [facility] tomorrow morning.  We discussed the procedure as well as expected postoperative course including risks benefits and alternatives.  She would like to proceed with surgery.  All questions were welcomed and answered.  She should be n.p.o. after midnight.  Isabella Stalling 07/03/2017, 12:52 PM

## 2017-07-03 NOTE — Consult Note (Signed)
Reason for Consult:left hip fracture Referring Physician: Kallista Pae is an 84 y.o. female.  HPI: 84 year old active [place] [facility] [place]. She had immediate concern of left hip pain.  Denies other injury other than a scrape on her hand.  She was unable to ambulate and was taken to the [facility] emergency department where she was found to have a left displaced femoral neck fracture.  She is currently being treated for recurrent lung cancer and was set up to start radiation soon.  She lives independently with her husband and previously was not using any assistance for ambulation.  At this point she complains of left hip pain, severe with movement, better with rest.  Past Medical History:  Diagnosis Date  . Blind left eye 10/2013   cva  . Breast cancer ([place]) 05/2014   ER+/PR+/Her2-     LUNG CANCER  . Bundle branch block left   . Chest pain   . Family history of bladder cancer   . Family history of breast cancer   . Family history of prostate cancer   . GERD (gastroesophageal reflux disease)   . History of blood transfusion    age 89  . Hyperlipidemia   . Hypertension   . Hypothyroid   . Lung nodule   . Melanoma in situ of face ([place]) JUNE 2015   LEFT CHEEK  . Meningioma ([place])    brain lining  . Migraine    reports only having one  . Osteoarthritis   . Personal history of radiation therapy   . Radiation 09/16/14-10/07/14   Right  Breast  21 fractions  . Skin cancer   . Stroke [facility]) 2015   Blind in left eye as a result    Past Surgical History:  Procedure Laterality Date  . ABDOMINAL HYSTERECTOMY  ~ 1997  . APPENDECTOMY     age 17  . BILATERAL SALPINGOOPHORECTOMY    . BLADDER REPAIR  2009   cystocele  . BREAST BIOPSY    . BREAST LUMPECTOMY Right   . BUNIONECTOMY     bilateral great toe  . CARDIAC CATHETERIZATION  2004  . CHOLECYSTECTOMY  ~ 1997  . COLONOSCOPY    . CRYO INTERCOSTAL NERVE BLOCK Left 11/25/2014   Procedure: CRYO  INTERCOSTAL NERVE BLOCK;  Surgeon: [name], MD;  Location: [facility];  Service: Thoracic;  Laterality: Left;  . CT RADIATION THERAPY GUIDE     Gamma radiation -lt frontal-[facility]  . DILATION AND CURETTAGE OF UTERUS     X 2  . MELANOMA EXCISION Left 11/05/23, 11/30/13   CHEEK  . OTHER SURGICAL HISTORY     Gamma knife to Meningioma in brain lining  . SEGMENTECOMY Left 11/25/2014   Procedure: LEFT LOWER LOBE SEGMENTECTOMY;  Surgeon: [name], MD;  Location: [facility];  Service: Thoracic;  Laterality: Left;  Marland Kitchen VIDEO ASSISTED THORACOSCOPY Left 11/25/2014   Procedure: VIDEO ASSISTED THORACOSCOPY;  Surgeon: [name], MD;  Location: [facility];  Service: Thoracic;  Laterality: Left;  Marland Kitchen VIDEO BRONCHOSCOPY WITH ENDOBRONCHIAL NAVIGATION N/A 06/19/2017   Procedure: VIDEO BRONCHOSCOPY;  Surgeon: [name], MD;  Location: [facility];  Service: Thoracic;  Laterality: N/A;  . VIDEO BRONCHOSCOPY WITH ENDOBRONCHIAL ULTRASOUND N/A 11/25/2014   Procedure: VIDEO BRONCHOSCOPY WITH ENDOBRONCHIAL ULTRASOUND;  Surgeon: [name], MD;  [facility];  Service: Thoracic;  Laterality: N/A;    Family History  Problem Relation Age  of Onset  . CAD Mother        CABG  . Hyperlipidemia Mother   . AAA (abdominal aortic aneurysm) Mother   . Renal Disease Father   . Kidney disease Father   . CAD Father   . Cancer Father        PROSTATE  . Diabetes Brother   . Hyperlipidemia Brother   . Breast cancer Daughter 51  . Breast cancer Sister 34  . Breast cancer Sister 54  . Cancer Brother        NOS  . Bladder Cancer Brother 22  . Lung cancer Cousin        3 paternal cousins with lung cancer  . Breast cancer Cousin        five paternal first cousins  . Cancer Cousin        4 paternal first cousins with Cancer NOS  . Cancer Cousin        1 paternal cousin with oral cancer (tongue)    Social History:  reports that  has never smoked. she has never used smokeless tobacco. She  reports that she does not drink alcohol or use drugs.  Allergies:  Allergies  Allergen Reactions  . Beef-Derived Products Other (See Comments)    ACID REFLUX  . Chocolate     Acid Reflux  . Other     FRIED FOODS  . Hydrocodone Other (See Comments)    INSOMNIA HYPERACTIVITY  . Ibuprofen Rash and Other (See Comments)    Causes mouth sores  . Niacin And Related Other (See Comments)    Flushing, burning, redness  . Tramadol Itching    Medications: I have reviewed the patient's current medications.  Results for orders placed or performed during the hospital encounter of 07/03/17 (from the past 48 hour(s))  CBC WITH DIFFERENTIAL     Status: None   Collection Time: 07/03/17 12:14 PM  Result Value Ref Range   WBC 8.0 4.0 - 10.5 K/uL   RBC 4.22 3.87 - 5.11 MIL/uL   Hemoglobin 12.3 12.0 - 15.0 g/dL   HCT 36.6 36.0 - 46.0 %   MCV 86.7 78.0 - 100.0 fL   MCH 29.1 26.0 - 34.0 pg   MCHC 33.6 30.0 - 36.0 g/dL   RDW 13.8 11.5 - 15.5 %   Platelets 157 150 - 400 K/uL   Neutrophils Relative % 85 %   Neutro Abs 6.8 1.7 - 7.7 K/uL   Lymphocytes Relative 9 %   Lymphs Abs 0.7 0.7 - 4.0 K/uL   Monocytes Relative 4 %   Monocytes Absolute 0.3 0.1 - 1.0 K/uL   Eosinophils Relative 2 %   Eosinophils Absolute 0.2 0.0 - 0.7 K/uL   Basophils Relative 0 %   Basophils Absolute 0.0 0.0 - 0.1 K/uL    Dg Chest 2 View  Result Date: 07/03/2017 CLINICAL DATA:  Fall today, LEFT hip pain. EXAM: CHEST  2 VIEW COMPARISON:  06/18/2017. FINDINGS: Partial exclusion of the RIGHT lung base. Unremarkable cardiomediastinal silhouette. No consolidation or edema. No effusion or pneumothorax. Skeletal osteopenia. IMPRESSION: No active cardiopulmonary disease.Stable chest. Electronically Signed   By: Staci Righter M.D.   On: 07/03/2017 10:45   Dg Hip Unilat With Pelvis 2-3 Views Left  Result Date: 07/03/2017 CLINICAL DATA:  84 year old female status post fall today with left hip pain. Breast and lung cancer. EXAM: DG  HIP (WITH OR WITHOUT PELVIS) 2-3V LEFT COMPARISON:  PET-CT 03/04/2017 FINDINGS: Left femoral neck fracture with  mild impaction and cephalad displacement. The left femoral head remains normally located. The intertrochanteric segment of the left femur appears intact. The pelvis appears intact. The proximal right femur appears grossly intact. Bone mineralization is within normal limits for age. Iliofemoral calcified atherosclerosis. Negative visible bowel gas pattern. IMPRESSION: Acute left femoral neck fracture with varus impaction. Electronically Signed   By: Genevie Ann M.D.   On: 07/03/2017 10:47    Review of Systems  All other systems reviewed and are negative.  Blood pressure (!) 173/73, pulse 74, temperature 97.8 F (36.6 C), temperature source Oral, resp. rate 20, SpO2 98 %. Physical Exam  Constitutional: She is oriented to person, place, and time. She appears well-developed and well-nourished.  HENT:  Head: Atraumatic.  Eyes: EOM are normal.  Cardiovascular: Intact distal pulses.  Respiratory: Effort normal.  Musculoskeletal:  LLE  Shortened and externally rotated. Pain with any motion of hip. No TTP about knee.  Distally NVI.  Neurological: She is alert and oriented to person, place, and time.  Skin: Skin is warm and dry.  Psychiatric: She has a normal mood and affect.    Assessment/Plan: Left displaced femoral neck fracture Plan left hip hemiarthroplasty The patient will be admitted to the hospitalist service and transferred to [facility] for [facility] tomorrow morning.  We discussed the procedure as well as expected postoperative course including risks benefits and alternatives.  She would like to proceed with surgery.  All questions were welcomed and answered.  She should be n.p.o. after midnight.  Isabella Stalling 07/03/2017, 12:52 PM

## 2017-07-03 NOTE — H&P (Addendum)
[facility] History and Physical  CANYON WILLOW ZOX:[id number] DOB: 1933-09-12 DOA: 07/03/2017  Referring physician:   PCP: [name], MD   Chief Complaint:  fall  HPI:   84 year old female with a history of recurrent adenocarcinoma status post segmentectomy of the left lower lobe in 2016 with recurrence, now pending radiation treatments, dyslipidemia, hypertension who presents to the ED today after a fall. Patient was leaving [employer] exercise class when she slipped on a patch of ice. She fell on her left side and sustained an abrasion on her left palm. She denied any palpitations, chest pain shortness of breath loss of consciousness. Patient is fairly active at baseline. She denies any hemoptysis. Recently diagnosed with recurrence of adenocarcinoma and was supposed to start radiation treatments on Tuesday next week by Dr. Lisbeth Renshaw. Patient has been evaluated by Dr. Tamera Punt in the ED was requesting transfer to [facility] for repair.     Review of Systems: negative for the following  Constitutional: Denies fever, chills, diaphoresis, appetite change and fatigue.  HEENT: Denies photophobia, eye pain, redness, hearing loss, ear pain, congestion, sore throat, rhinorrhea, sneezing, mouth sores, trouble swallowing, neck pain, neck stiffness and tinnitus.  Respiratory: Denies SOB, DOE, cough, chest tightness, and wheezing.  Cardiovascular: Denies chest pain, palpitations and leg swelling.  Gastrointestinal: Denies nausea, vomiting, abdominal pain, diarrhea, constipation, blood in stool and abdominal distention.  Genitourinary: Denies dysuria, urgency, frequency, hematuria, flank pain and difficulty urinating.  Musculoskeletal: positive for pain and joint swelling in the left hip region, arthralgias and gait problem.  Skin: Denies pallor, rash and wound.  Neurological: Denies dizziness, seizures, syncope, weakness, light-headedness, numbness and headaches.  Hematological: Denies adenopathy.  Easy bruising, personal or family bleeding history  Psychiatric/Behavioral: Denies suicidal ideation, mood changes, confusion, nervousness, sleep disturbance and agitation       Past Medical History:  Diagnosis Date  . Blind left eye 10/2013   cva  . Breast cancer ([place]) 05/2014   ER+/PR+/Her2-     LUNG CANCER  . Bundle branch block left   . Chest pain   . Family history of bladder cancer   . Family history of breast cancer   . Family history of prostate cancer   . GERD (gastroesophageal reflux disease)   . History of blood transfusion    age 25  . Hyperlipidemia   . Hypertension   . Hypothyroid   . Lung nodule   . Melanoma in situ of face ([place]) JUNE 2015   LEFT CHEEK  . 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 Migraine    reports only having one  . Osteoarthritis   . Personal history of radiation therapy   . Radiation 09/16/14-10/07/14   Right  Breast  21 fractions  . Skin cancer   . Stroke [facility]) 2015   Blind in left eye as a result     Past Surgical History:  Procedure Laterality Date  . ABDOMINAL HYSTERECTOMY  ~ 1997  . APPENDECTOMY     age 62  . BILATERAL SALPINGOOPHORECTOMY    . BLADDER REPAIR  2009   cystocele  . BREAST BIOPSY    . BREAST LUMPECTOMY Right   . BUNIONECTOMY     bilateral great toe  . CARDIAC CATHETERIZATION  2004  . CHOLECYSTECTOMY  ~ 1997  . COLONOSCOPY    . CRYO INTERCOSTAL NERVE BLOCK Left 11/25/2014   Procedure: CRYO INTERCOSTAL NERVE BLOCK;  Surgeon: [name], MD;  Location: [place];  Service:  Thoracic;  Laterality: Left;  . CT RADIATION THERAPY GUIDE     Gamma radiation -lt frontal-[facility]  . DILATION AND CURETTAGE OF UTERUS     X 2  . MELANOMA EXCISION Left 11/05/23, 11/30/13   CHEEK  . OTHER SURGICAL HISTORY     Gamma knife to Meningioma in brain lining  . SEGMENTECOMY Left 11/25/2014   Procedure: LEFT LOWER LOBE SEGMENTECTOMY;  Surgeon: [name], MD;  Location: [place];  Service: Thoracic;  Laterality: Left;  Marland Kitchen  VIDEO ASSISTED THORACOSCOPY Left 11/25/2014   Procedure: VIDEO ASSISTED THORACOSCOPY;  Surgeon: [name], MD;  Location: [place];  Service: Thoracic;  Laterality: Left;  Marland Kitchen VIDEO BRONCHOSCOPY WITH ENDOBRONCHIAL NAVIGATION N/A 06/19/2017   Procedure: VIDEO BRONCHOSCOPY;  Surgeon: [name], MD;  Location: [place];  Service: Thoracic;  Laterality: N/A;  . VIDEO BRONCHOSCOPY WITH ENDOBRONCHIAL ULTRASOUND N/A 11/25/2014   Procedure: VIDEO BRONCHOSCOPY WITH ENDOBRONCHIAL ULTRASOUND;  Surgeon: [name], MD;  Location: [place];  Service: Thoracic;  Laterality: N/A;      Social History:  reports that  has never smoked. she has never used smokeless tobacco. She reports that she does not drink alcohol or use drugs.    Allergies  Allergen Reactions  . Beef-Derived Products Other (See Comments)    ACID REFLUX  . Chocolate     Acid Reflux  . Other     FRIED FOODS  . Hydrocodone Other (See Comments)    INSOMNIA HYPERACTIVITY  . Ibuprofen Rash and Other (See Comments)    Causes mouth sores  . Niacin And Related Other (See Comments)    Flushing, burning, redness  . Tramadol Itching    Family History  Problem Relation Age of Onset  . CAD Mother        CABG  . Hyperlipidemia Mother   . AAA (abdominal aortic aneurysm) Mother   . Renal Disease Father   . Kidney disease Father   . CAD Father   . Cancer Father        PROSTATE  . Diabetes Brother   . Hyperlipidemia Brother   . Breast cancer Daughter 4  . Breast cancer Sister 55  . Breast cancer Sister 36  . Cancer Brother        NOS  . Bladder Cancer Brother 27  . Lung cancer Cousin        3 paternal cousins with lung cancer  . Breast cancer Cousin        five paternal first cousins  . Cancer Cousin        4 paternal first cousins with Cancer NOS  . Cancer Cousin        1 paternal cousin with oral cancer (tongue)         Prior to Admission medications   Medication Sig Start Date End Date Taking?  Authorizing Provider  alendronate (FOSAMAX) 70 MG tablet Take 70 mg by mouth once a week. Pt takes medication before breakfast with full glass of water. Pt sits upright for 30 minutes after taking medicine. Do not  lie down or recline for at least 30 minutes after taking medication ON SUNDAYS 10/04/16  Yes [provider]  aspirin EC 81 MG tablet Take 81 mg by mouth daily.   Yes [provider]  atenolol (TENORMIN) 50 MG tablet Take 50 mg by mouth at bedtime.    Yes [provider]  b complex vitamins capsule Take 1 capsule by mouth daily.  Yes [provider]  calcium-vitamin D (OSCAL WITH D) 500-200 MG-UNIT per tablet Take 1 tablet by mouth at bedtime.    Yes [provider]  Cholecalciferol (VITAMIN D) 2000 units tablet Take 2,000 Units by mouth daily.   Yes [provider]  Digestive Enzymes (ENZYME DIGEST PO) Take 5 mLs by mouth 3 (three) times daily with meals.   Yes [provider]  fish oil-omega-3 fatty acids 1000 MG capsule Take 1 g by mouth daily.    Yes [provider]  levothyroxine (SYNTHROID, LEVOTHROID) 112 MCG tablet Take 112 mcg by mouth daily before breakfast.    Yes [provider]  lisinopril (PRINIVIL,ZESTRIL) 10 MG tablet Take 1 tablet (10 mg total) by mouth daily. Patient taking differently: Take 10 mg by mouth 2 (two) times daily.  11/29/14  Yes Gold, Patrick Jupiter E, PA-C  Multiple Vitamin (MULTIVITAMIN) tablet Take 1 tablet by mouth every morning.    Yes [provider]  Multiple Vitamins-Minerals (VISION FORMULA PO) Take 1 capsule by mouth daily.   Yes [provider]  Polyvinyl Alcohol-Povidone (REFRESH OP) Apply 1 drop to eye daily as needed (DRY EYES).   Yes [provider]  Triamcinolone Acetonide (NASACORT AQ NA) Place 1 spray into the nose at bedtime as needed (CONGESTION).   Yes [provider]  vitamin E (E-400) 400 UNIT capsule Take 400 Units by mouth every  Monday, Wednesday, and Friday. Mon Wed Fri   Yes [provider]     Physical Exam: Vitals:   07/03/17 1010 07/03/17 1225  BP: (!) 185/84 (!) 173/73  Pulse: 68 74  Resp: 18 20  Temp: 97.8 F (36.6 C)   TempSrc: Oral   SpO2: 97% 98%        Vitals:   07/03/17 1010 07/03/17 1225  BP: (!) 185/84 (!) 173/73  Pulse: 68 74  Resp: 18 20  Temp: 97.8 F (36.6 C)   TempSrc: Oral   SpO2: 97% 98%   Constitutional: NAD, calm, comfortable Eyes: PERRL, lids and conjunctivae normal ENMT: Mucous membranes are moist. Posterior pharynx clear of any exudate or lesions.Normal dentition.  Neck: normal, supple, no masses, no thyromegaly Respiratory: clear to auscultation bilaterally, no wheezing, no crackles. Normal respiratory effort. No accessory muscle use.  Cardiovascular: Regular rate and rhythm, no murmurs / rubs / gallops. No extremity edema. 2+ pedal pulses. No carotid bruits.  Abdomen: no tenderness, no masses palpated. No hepatosplenomegaly. Bowel sounds positive.  Musculoskeletal: no clubbing / cyanosis. No joint deformity upper and lower extremities. Good ROM, no contractures. Normal muscle tone.  Skin: no rashes, lesions, ulcers. No induration Neurologic: CN 2-12 grossly intact. Sensation intact, DTR normal. Strength 5/5 in all 4.  Psychiatric: Normal judgment and insight. Alert and oriented x 3. Normal mood.     Labs on Admission: I have personally reviewed following labs and imaging studies  CBC: Recent Labs  Lab 07/03/17 1214  WBC 8.0  NEUTROABS 6.8  HGB 12.3  HCT 36.6  MCV 86.7  PLT 701    Basic Metabolic Panel: Recent Labs  Lab 07/03/17 1214  NA 140  K 3.8  CL 104  CO2 29  GLUCOSE 95  BUN 16  CREATININE 0.56  CALCIUM 9.3    GFR: Estimated Creatinine Clearance: 50.8 mL/min (by C-G formula based on SCr of 0.56 mg/dL).  Liver Function Tests: No results for input(s): AST, ALT, ALKPHOS, BILITOT, PROT, ALBUMIN in the last 168 hours. No results  for input(s): LIPASE,  AMYLASE in the last 168 hours. No results for input(s): AMMONIA in the last 168 hours.  Coagulation Profile: Recent Labs  Lab 07/03/17 1214  INR 0.96   No results for input(s): DDIMER in the last 72 hours.  Cardiac Enzymes: No results for input(s): CKTOTAL, CKMB, CKMBINDEX, TROPONINI in the last 168 hours.  BNP (last 3 results) No results for input(s): PROBNP in the last 8760 hours.  HbA1C: No results for input(s): HGBA1C in the last 72 hours. Lab Results  Component Value Date   HGBA1C 5.9 (H) 10/25/2013     CBG: No results for input(s): GLUCAP in the last 168 hours.  Lipid Profile: No results for input(s): CHOL, HDL, LDLCALC, TRIG, CHOLHDL, LDLDIRECT in the last 72 hours.  Thyroid Function Tests: No results for input(s): TSH, T4TOTAL, FREET4, T3FREE, THYROIDAB in the last 72 hours.  Anemia Panel: No results for input(s): VITAMINB12, FOLATE, FERRITIN, TIBC, IRON, RETICCTPCT in the last 72 hours.  Urine analysis:    Component Value Date/Time   COLORURINE YELLOW 11/25/2014 0641   APPEARANCEUR HAZY (A) 11/25/2014 0641   LABSPEC 1.010 12/19/2015 1830   PHURINE 5.5 12/19/2015 1830   GLUCOSEU NEGATIVE 12/19/2015 1830   HGBUR SMALL (A) 12/19/2015 1830   BILIRUBINUR NEGATIVE 12/19/2015 1830   KETONESUR NEGATIVE 12/19/2015 1830   PROTEINUR NEGATIVE 12/19/2015 1830   UROBILINOGEN 0.2 12/19/2015 1830   NITRITE NEGATIVE 12/19/2015 1830   LEUKOCYTESUR TRACE (A) 12/19/2015 1830    Sepsis Labs: '@LABRCNTIP' (procalcitonin:4,lacticidven:4) )No results found for this or any previous visit (from the past 240 hour(s)).       Radiological Exams on Admission: Dg Chest 2 View  Result Date: 07/03/2017 CLINICAL DATA:  Fall today, LEFT hip pain. EXAM: CHEST  2 VIEW COMPARISON:  06/18/2017. FINDINGS: Partial exclusion of the RIGHT lung base. Unremarkable cardiomediastinal silhouette. No consolidation or edema. No effusion or pneumothorax. Skeletal osteopenia.  IMPRESSION: No active cardiopulmonary disease.Stable chest. Electronically Signed   By: Staci Righter M.D.   On: 07/03/2017 10:45   Dg Hip Unilat With Pelvis 2-3 Views Left  Result Date: 07/03/2017 CLINICAL DATA:  84 year old female status post fall today with left hip pain. Breast and lung cancer. EXAM: DG HIP (WITH OR WITHOUT PELVIS) 2-3V LEFT COMPARISON:  PET-CT 03/04/2017 FINDINGS: Left femoral neck fracture with mild impaction and cephalad displacement. The left femoral head remains normally located. The intertrochanteric segment of the left femur appears intact. The pelvis appears intact. The proximal right femur appears grossly intact. Bone mineralization is within normal limits for age. Iliofemoral calcified atherosclerosis. Negative visible bowel gas pattern. IMPRESSION: Acute left femoral neck fracture with varus impaction. Electronically Signed   By: Genevie Ann M.D.   On: 07/03/2017 10:47   Dg Chest 2 View  Result Date: 07/03/2017 CLINICAL DATA:  Fall today, LEFT hip pain. EXAM: CHEST  2 VIEW COMPARISON:  06/18/2017. FINDINGS: Partial exclusion of the RIGHT lung base. Unremarkable cardiomediastinal silhouette. No consolidation or edema. No effusion or pneumothorax. Skeletal osteopenia. IMPRESSION: No active cardiopulmonary disease.Stable chest. Electronically Signed   By: Staci Righter M.D.   On: 07/03/2017 10:45   Dg Chest 2 View  Result Date: 06/18/2017 CLINICAL DATA:  Preoperative chest x-ray prior to video bronchoscopy. EXAM: CHEST  2 VIEW COMPARISON:  CT scan of the chest of June 10, 2017 and chest x-ray of December 14, 2014. FINDINGS: The lungs are mildly hyperinflated. The interstitial markings are coarse though stable. There is no alveolar infiltrate or pleural effusion. There is stable biapical  pleural thickening. The heart and pulmonary vascularity are normal. The mediastinum is normal in width. There calcification in the wall of the thoracic aorta. There multilevel degenerative disc  disease of the thoracic spine. IMPRESSION: Chronic bronchitic changes, stable. No pneumonia, CHF, nor other acute cardiopulmonary abnormality. Electronically Signed   By: David  Martinique M.D.   On: 06/18/2017 14:13   Ct Chest Wo Contrast  Result Date: 06/10/2017 CLINICAL DATA:  Status post left lower lobe superior segmentectomy 11/25/2014 for primary bronchogenic adenocarcinoma. Patient presents for follow-up of enlarging bandlike nodularity in the left lower lobe. EXAM: CT CHEST WITHOUT CONTRAST TECHNIQUE: Multidetector CT imaging of the chest was performed following the standard protocol without IV contrast. COMPARISON:  03/04/2017 PET-CT.  02/19/2017 chest CT. FINDINGS: Cardiovascular: Normal heart size. No significant pericardial fluid/thickening. Coronary atherosclerosis. Atherosclerotic nonaneurysmal thoracic aorta. Normal caliber pulmonary arteries. Mediastinum/Nodes: No discrete thyroid nodules. Unremarkable esophagus. No pathologically enlarged axillary, mediastinal or gross hilar lymph nodes, noting limited sensitivity for the detection of hilar adenopathy on this noncontrast study. Lungs/Pleura: No pneumothorax. No pleural effusion. Status post superior segmentectomy in the left lower lobe. There is continued mild growth of the solid bandlike irregular nodularity in the anterior left lower lobe along the surgical sutures, which measures 1.8 x 1.1 cm on series 5/ image 64, mildly increased from 1.7 x 0.9 cm, and which measures 2.3 x 1.6 cm more centrally on series 5/ image 66, mildly increased from 2.0 x 1.2 cm. New mild patchy consolidation in the inferior right middle lobe superimposed on chronic patchy tree-in-bud opacities. New subsolid 5 mm right upper lobe pulmonary nodule (series 5/ image 39). Additional previously described subcentimeter scattered ground-glass pulmonary nodules in the bilateral upper lobes measuring up to 9 mm in the left upper lobe (series 5/image 30) are unchanged. Patchy  radiation fibrosis in the anterior mid to upper right lung is stable. Upper abdomen: Unremarkable. Musculoskeletal: No aggressive appearing focal osseous lesions. Marked thoracic spondylosis. IMPRESSION: 1. Continued mild interval growth of irregular solid bandlike nodularity in the anterior left lower lobe along the segmentectomy suture line. Findings remain worrisome for slowly progressive local tumor recurrence. 2. New patchy consolidation superimposed on chronic tree-in-bud opacities in the inferior right middle lobe, nonspecific, more likely a mild pneumonia. 3. New nonspecific subsolid 5 mm right upper lobe pulmonary nodule. Additional subcentimeter ground-glass bilateral upper lobe pulmonary nodules are stable. Attention recommended on follow-up chest CT in 3-6 months. 4. Coronary atherosclerosis. Aortic Atherosclerosis (ICD10-I70.0). Electronically Signed   By: Ilona Sorrel M.D.   On: 06/10/2017 18:11   Mm Diag Breast Tomo Bilateral  Result Date: 06/17/2017 CLINICAL DATA:  84 year old female status post right lumpectomy in 2016. EXAM: 2D DIGITAL DIAGNOSTIC BILATERAL MAMMOGRAM WITH CAD AND ADJUNCT TOMO COMPARISON:  Previous exam(s). ACR Breast Density Category b: There are scattered areas of fibroglandular density. FINDINGS: Stable postoperative changes are demonstrated in the upper outer right breast posteriorly. No suspicious masses, calcifications or distortion is identified in either breast. Mammographic images were processed with CAD. IMPRESSION: 1. No mammographic evidence of malignancy in either breast. 2. Stable postoperative changes on the right. RECOMMENDATION: Diagnostic mammogram is suggested in 1 year. (Code:DM-B-01Y) I have discussed the findings and recommendations with the patient. Results were also provided in writing at the conclusion of the visit. If applicable, a reminder letter will be sent to the patient regarding the next appointment. BI-RADS CATEGORY  2: Benign. Electronically  Signed   By: Kristopher Oppenheim M.D.   On: 06/17/2017 12:00  Dg Hip Unilat With Pelvis 2-3 Views Left  Result Date: 07/03/2017 CLINICAL DATA:  84 year old female status post fall today with left hip pain. Breast and lung cancer. EXAM: DG HIP (WITH OR WITHOUT PELVIS) 2-3V LEFT COMPARISON:  PET-CT 03/04/2017 FINDINGS: Left femoral neck fracture with mild impaction and cephalad displacement. The left femoral head remains normally located. The intertrochanteric segment of the left femur appears intact. The pelvis appears intact. The proximal right femur appears grossly intact. Bone mineralization is within normal limits for age. Iliofemoral calcified atherosclerosis. Negative visible bowel gas pattern. IMPRESSION: Acute left femoral neck fracture with varus impaction. Electronically Signed   By: Genevie Ann M.D.   On: 07/03/2017 10:47   Dg C-arm Bronchoscopy  Result Date: 06/19/2017 C-ARM BRONCHOSCOPY: Fluoroscopy was utilized by the requesting physician.  No radiographic interpretation.      EKG: Independently reviewed.  Left bundle branch block   Assessment/Plan Principal Problem:   Closed fracture of acetabulum with dislocation of left hip (HCC) preoperative risk assessment includes baseline EKG that shows left bundle branch block which is old and was present on EKG in 2015 No history of atrial fibrillation, congestive heart failure, no cardiopulmonary symptoms Chest x-ray negative Deemed low risk from a cardiopulmonary standpoint and okay to proceed with surgery in the morning Will cover with Lovenox for DVT prophylaxis tonight and hold after midnight , defer DVT prophylax isis  to ortho after surgery   check vitamin D level       HTN (hypertension)- continue atenolol  And lisinopril    Hypothyroidism- continue syntrhoid , check TSH    GERD (gastroesophageal reflux disease)-avoid NSAIDs  Recurrent adenocarcinoma, seen by Dr. Kyung Rudd, plan was to start radiation treatments next week        DVT prophylaxis: lovenox tonight given history of underlying malignancy,will be held after midnight      Code Status Orders  (From admission, onward)        Start     Ordered   07/03/17 1341  Full code  Continuous     07/03/17 1344    Code Status History    Date Active Date Inactive Code Status Order ID Comments User Context   11/25/2014 18:31 11/29/2014 13:26 Full Code [id number]  Odis Luster Inpatient   10/24/2013 19:45 10/26/2013 17:35 Full Code [id number]  Erline Hau, MD Inpatient    Advance Directive Documentation     Most Recent Value  Type of Advance Directive  Healthcare Power of Attorney  Pre-existing out of facility DNR order (yellow form or pink MOST form)  No data  "MOST" Form in Place?  No data       consults called:  Family Communication: Admission, patients condition and plan of care including tests being ordered have been discussed with the patient  who indicates understanding and agree with the plan and Code Status  Admission status: inpatient ????????????????????????  Disposition plan: Further plan will depend as patient's clinical course evolves and further radiologic and laboratory data become available. Likely home when stable   At the time of admission, it appears that the appropriate admission status for this patient is INPATIENT .Thisis judged to be reasonable and necessary in order to provide the required intensity of service to ensure the patient's safetygiven thepresenting symptoms, physical exam findings, and initial radiographic and laboratory data in the context of their chronic comorbidities.   Reyne Dumas MD [facility] Pager [phone]  If 7PM-7AM, please contact night-coverage [email or web address] Password  [email or web address]  07/03/2017, 2:07 PM

## 2017-07-03 NOTE — ED Provider Notes (Signed)
[facility] DEPT Provider Note   CSN: [id number] Arrival date & time: 07/03/17  7948     History   Chief Complaint Chief Complaint  Patient presents with  . Hip Pain    HPI Melissa Snyder is a 84 y.o. female.  HPI All that occurred just prior to ED arrival. The patient was leaving the [facility] after an exercise class, when she slipped on ice. She fell onto her left side, landing onto the left thigh, and left wrist. Since that time she has had pain in the left hip, no ongoing pain in the left wrist. Pain is sore, worse with motion, but at rest the pain is minimal. No distal loss of sensation or strength. No head trauma, no loss of consciousness, no weakness in any extremity. Patient states that she has multiple medical issues, and is scheduled to begin chemotherapy for recurrent cancer this week.  Past Medical History:  Diagnosis Date  . Blind left eye 10/2013   cva  . Breast cancer ([place]) 05/2014   ER+/PR+/Her2-     LUNG CANCER  . Bundle branch block left   . Chest pain   . Family history of bladder cancer   . Family history of breast cancer   . Family history of prostate cancer   . GERD (gastroesophageal reflux disease)   . History of blood transfusion    age 63  . Hyperlipidemia   . Hypertension   . Hypothyroid   . Lung nodule   . Melanoma in situ of face ([place]) JUNE 2015   LEFT CHEEK  . Meningioma ([place])    brain lining  . Migraine    reports only having one  . Osteoarthritis   . Personal history of radiation therapy   . Radiation 09/16/14-10/07/14   Right  Breast  21 fractions  . Skin cancer   . Stroke [facility]) 2015   Blind in left eye as a result    Patient Active Problem List   Diagnosis Date Noted  . Closed fracture of acetabulum with dislocation of left hip ([place]) 07/03/2017  . S/p left hip fracture 07/03/2017  . Primary cancer of left lower lobe of lung ([place]) 11/25/2014  . Genetic testing 07/01/2014  . Family history of  breast cancer   . Family history of bladder cancer   . Family history of prostate cancer   . Breast cancer of upper-outer quadrant of right female breast ([place]) 05/04/2014  . Hypothyroid   . Hypertension   . Arthritis   . Hyperlipidemia 01/12/2014  . Melanoma in situ of face ([place]) 11/02/2013  . Central retinal artery occlusion of left eye 10/24/2013  . HTN (hypertension) 10/24/2013  . Hypothyroidism 10/24/2013  . GERD (gastroesophageal reflux disease) 10/24/2013    Past Surgical History:  Procedure Laterality Date  . ABDOMINAL HYSTERECTOMY  ~ 1997  . APPENDECTOMY     age 61  . BILATERAL SALPINGOOPHORECTOMY    . BLADDER REPAIR  2009   cystocele  . BREAST BIOPSY    . BREAST LUMPECTOMY Right   . BUNIONECTOMY     bilateral great toe  . CARDIAC CATHETERIZATION  2004  . CHOLECYSTECTOMY  ~ 1997  . COLONOSCOPY    . CRYO INTERCOSTAL NERVE BLOCK Left 11/25/2014   Procedure: CRYO INTERCOSTAL NERVE BLOCK;  Surgeon: [name], MD;  Location: [facility];  Service: Thoracic;  Laterality: Left;  . CT RADIATION THERAPY GUIDE     Gamma radiation -lt frontal-[facility]  .  DILATION AND CURETTAGE OF UTERUS     X 2  . MELANOMA EXCISION Left 11/05/23, 11/30/13   CHEEK  . OTHER SURGICAL HISTORY     Gamma knife to Meningioma in brain lining  . SEGMENTECOMY Left 11/25/2014   Procedure: LEFT LOWER LOBE SEGMENTECTOMY;  Surgeon: [name], MD;  Location: [facility];  Service: Thoracic;  Laterality: Left;  Marland Kitchen VIDEO ASSISTED THORACOSCOPY Left 11/25/2014   Procedure: VIDEO ASSISTED THORACOSCOPY;  Surgeon: [name], MD;  Location: [facility];  Service: Thoracic;  Laterality: Left;  Marland Kitchen VIDEO BRONCHOSCOPY WITH ENDOBRONCHIAL NAVIGATION N/A 06/19/2017   Procedure: VIDEO BRONCHOSCOPY;  Surgeon: [name], MD;  Location: [facility];  Service: Thoracic;  Laterality: N/A;  . VIDEO BRONCHOSCOPY WITH ENDOBRONCHIAL ULTRASOUND N/A 11/25/2014   Procedure: VIDEO BRONCHOSCOPY WITH ENDOBRONCHIAL  ULTRASOUND;  Surgeon: [name], MD;  Location: [place];  Service: Thoracic;  Laterality: N/A;    OB History    No data available       Home Medications    Prior to Admission medications   Medication Sig Start Date End Date Taking? Authorizing Provider  alendronate (FOSAMAX) 70 MG tablet Take 70 mg by mouth once a week. Pt takes medication before breakfast with full glass of water. Pt sits upright for 30 minutes after taking medicine. Do not  lie down or recline for at least 30 minutes after taking medication ON SUNDAYS 10/04/16  Yes [provider]  aspirin EC 81 MG tablet Take 81 mg by mouth daily.   Yes [provider]  atenolol (TENORMIN) 50 MG tablet Take 50 mg by mouth at bedtime.    Yes [provider]  b complex vitamins capsule Take 1 capsule by mouth daily.   Yes [provider]  calcium-vitamin D (OSCAL WITH D) 500-200 MG-UNIT per tablet Take 1 tablet by mouth at bedtime.    Yes [provider]  Cholecalciferol (VITAMIN D) 2000 units tablet Take 2,000 Units by mouth daily.   Yes [provider]  Digestive Enzymes (ENZYME DIGEST PO) Take 5 mLs by mouth 3 (three) times daily with meals.   Yes [provider]  fish oil-omega-3 fatty acids 1000 MG capsule Take 1 g by mouth daily.    Yes [provider]  levothyroxine (SYNTHROID, LEVOTHROID) 112 MCG tablet Take 112 mcg by mouth daily before breakfast.    Yes [provider]  lisinopril (PRINIVIL,ZESTRIL) 10 MG tablet Take 1 tablet (10 mg total) by mouth daily. Patient taking differently: Take 10 mg by mouth 2 (two) times daily.  11/29/14  Yes Gold, Patrick Jupiter E, PA-C  Multiple Vitamin (MULTIVITAMIN) tablet Take 1 tablet by mouth every morning.    Yes [provider]  Multiple Vitamins-Minerals (VISION FORMULA PO) Take 1 capsule by mouth daily.   Yes [provider]  Polyvinyl Alcohol-Povidone (REFRESH OP) Apply 1 drop to eye daily as  needed (DRY EYES).   Yes [provider]  Triamcinolone Acetonide (NASACORT AQ NA) Place 1 spray into the nose at bedtime as needed (CONGESTION).   Yes [provider]  vitamin E (E-400) 400 UNIT capsule Take 400 Units by mouth every Monday, Wednesday, and Friday. Mon Wed Fri   Yes [provider]    Family History Family History  Problem Relation Age of Onset  . CAD Mother        CABG  . Hyperlipidemia Mother   . AAA (abdominal aortic aneurysm) Mother   . Renal Disease  Father   . Kidney disease Father   . CAD Father   . Cancer Father        PROSTATE  . Diabetes Brother   . Hyperlipidemia Brother   . Breast cancer Daughter 76  . Breast cancer Sister 59  . Breast cancer Sister 24  . Cancer Brother        NOS  . Bladder Cancer Brother 26  . Lung cancer Cousin        3 paternal cousins with lung cancer  . Breast cancer Cousin        five paternal first cousins  . Cancer Cousin        4 paternal first cousins with Cancer NOS  . Cancer Cousin        1 paternal cousin with oral cancer (tongue)    Social History Social History   Tobacco Use  . Smoking status: Never Smoker  . Smokeless tobacco: Never Used  Substance Use Topics  . Alcohol use: No  . Drug use: No     Allergies   Beef-derived products; Other; Chocolate; Hydrocodone; Ibuprofen; Niacin and related; and Tramadol   Review of Systems Review of Systems  Constitutional:       Per HPI, otherwise negative  HENT:       Per HPI, otherwise negative  Respiratory:       Per HPI, otherwise negative  Cardiovascular:       Per HPI, otherwise negative  Gastrointestinal: Negative for vomiting.  Endocrine:       Negative aside from HPI  Genitourinary:       Neg aside from HPI   Musculoskeletal:       Per HPI, otherwise negative  Skin: Negative.   Allergic/Immunologic: Positive for immunocompromised state.  Neurological: Negative for syncope.     Physical Exam Updated Vital  Signs BP (!) 164/94 (BP Location: Left Arm)   Pulse 80   Temp 97.8 F (36.6 C) (Oral)   Resp 16   SpO2 97%   Physical Exam  Constitutional: She is oriented to person, place, and time. She appears well-developed and well-nourished. No distress.  HENT:  Head: Normocephalic and atraumatic.  Eyes: Conjunctivae and EOM are normal.  Cardiovascular: Normal rate, regular rhythm and intact distal pulses.  Pulmonary/Chest: Effort normal and breath sounds normal.  Abdominal: She exhibits no distension.  Musculoskeletal: She exhibits deformity.  Left leg notably shorter than the right, patient unwilling to flex the thigh secondary to pain. Ankle unremarkable, patient moves the ankle and toes without discomfort. Patient will not flex the knee secondary to pain in the hip, grossly unremarkable.  Neurological: She is alert and oriented to person, place, and time. No cranial nerve deficit.  Skin: Skin is warm and dry.     Psychiatric: She has a normal mood and affect.  Nursing note and vitals reviewed.    ED Treatments / Results  Labs (all labs ordered are listed, but only abnormal results are displayed) Labs Reviewed  BASIC METABOLIC PANEL  CBC WITH DIFFERENTIAL/PLATELET  PROTIME-INR  URINALYSIS, ROUTINE W REFLEX MICROSCOPIC  CBC  CREATININE, SERUM  MAGNESIUM  TSH  TYPE AND SCREEN    EKG  EKG Interpretation  Date/Time:  Wednesday July 03 2017 10:15:27 EST Ventricular Rate:  70 PR Interval:    QRS Duration: 157 QT Interval:  478 QTC Calculation: 516 R Axis:   59 Text Interpretation:  Sinus rhythm Left bundle branch block No significant change since last tracing Abnormal ekg  Confirmed by Carmin Muskrat [phone]) on 07/03/2017 10:27:45 AM       Radiology Dg Chest 2 View  Result Date: 07/03/2017 CLINICAL DATA:  Fall today, LEFT hip pain. EXAM: CHEST  2 VIEW COMPARISON:  06/18/2017. FINDINGS: Partial exclusion of the RIGHT lung base. Unremarkable cardiomediastinal  silhouette. No consolidation or edema. No effusion or pneumothorax. Skeletal osteopenia. IMPRESSION: No active cardiopulmonary disease.Stable chest. Electronically Signed   By: Staci Righter M.D.   On: 07/03/2017 10:45   Dg Hip Unilat With Pelvis 2-3 Views Left  Result Date: 07/03/2017 CLINICAL DATA:  84 year old female status post fall today with left hip pain. Breast and lung cancer. EXAM: DG HIP (WITH OR WITHOUT PELVIS) 2-3V LEFT COMPARISON:  PET-CT 03/04/2017 FINDINGS: Left femoral neck fracture with mild impaction and cephalad displacement. The left femoral head remains normally located. The intertrochanteric segment of the left femur appears intact. The pelvis appears intact. The proximal right femur appears grossly intact. Bone mineralization is within normal limits for age. Iliofemoral calcified atherosclerosis. Negative visible bowel gas pattern. IMPRESSION: Acute left femoral neck fracture with varus impaction. Electronically Signed   By: Genevie Ann M.D.   On: 07/03/2017 10:47    Procedures Procedures (including critical care time)  Medications Ordered in ED Medications  fentaNYL (SUBLIMAZE) injection 25 mcg (not administered)  enoxaparin (LOVENOX) injection 40 mg (not administered)  0.9 %  sodium chloride infusion (not administered)  acetaminophen (TYLENOL) tablet 650 mg (not administered)    Or  acetaminophen (TYLENOL) suppository 650 mg (not administered)  senna (SENOKOT) tablet 8.6 mg (not administered)  docusate sodium (COLACE) capsule 100 mg (not administered)  ondansetron (ZOFRAN) tablet 4 mg (not administered)    Or  ondansetron (ZOFRAN) injection 4 mg (not administered)  hydrALAZINE (APRESOLINE) injection 5 mg (not administered)     Initial Impression / Assessment and Plan / ED Course  I have reviewed the triage vital signs and the nursing notes.  Pertinent labs & imaging results that were available during my care of the patient were reviewed by me and considered in my  medical decision making (see chart for details).     Update: Patient in similar condition, minimal ongoing complaints.   Update:, Patient aware of all findings including evidence for left femoral neck fracture. Evaluation otherwise reassuring. I discussed patient's case with orthopedic surgeon on call, as well as with our internal medicine admitting team. With concern for new femoral neck fracture, patient will required admission at our affiliated facility for planned operative repair tomorrow. Evaluation here otherwise reassuring.  Final Clinical Impressions(s) / ED Diagnoses   Final diagnoses:  Closed fracture of left hip, initial encounter [facility])     Carmin Muskrat, MD 07/03/17 [phone]

## 2017-07-03 NOTE — ED Triage Notes (Signed)
She tripped whilst walking across a local [employer] parking lot "slipped on ice". She c/o left hip pain. She arrives in no distress.

## 2017-07-03 NOTE — ED Notes (Signed)
Patient has been placed on the purewick due to broken hip. Patient also given meal tray per surgeon. Patient notified staff that she can not eat meat or chocolate.

## 2017-07-03 NOTE — Progress Notes (Signed)
OT Cancellation Note  Patient Details Name: Melissa Snyder MRN: [id number] DOB: 08/15/1933   Cancelled Treatment:    Reason Eval/Treat Not Completed: Patient not medically ready. Noted plan is for pt to be transferred to [facility] for sx tomorrow. Please reorder after sx.  [name] 07/03/2017, 3:28 PM  Lesle Chris, OTR/L [phone] 07/03/2017

## 2017-07-04 ENCOUNTER — Inpatient Hospital Stay (HOSPITAL_COMMUNITY): Payer: Medicare HMO | Admitting: Anesthesiology

## 2017-07-04 ENCOUNTER — Inpatient Hospital Stay (HOSPITAL_COMMUNITY): Payer: Medicare HMO

## 2017-07-04 ENCOUNTER — Inpatient Hospital Stay (HOSPITAL_COMMUNITY): Admission: RE | Admit: 2017-07-04 | Payer: Medicare HMO | Source: Ambulatory Visit | Admitting: Orthopedic Surgery

## 2017-07-04 ENCOUNTER — Encounter (HOSPITAL_COMMUNITY)
Admission: EM | Disposition: A | Discharge status: Skilled Nursing Facility | Payer: Self-pay | Source: Home / Self Care | Attending: Internal Medicine

## 2017-07-04 DIAGNOSIS — S32402A Unspecified fracture of left acetabulum, initial encounter for closed fracture: Secondary | ICD-10-CM

## 2017-07-04 DIAGNOSIS — K219 Gastro-esophageal reflux disease without esophagitis: Secondary | ICD-10-CM

## 2017-07-04 DIAGNOSIS — E039 Hypothyroidism, unspecified: Secondary | ICD-10-CM

## 2017-07-04 HISTORY — PX: HIP ARTHROPLASTY: SHX981

## 2017-07-04 LAB — TYPE AND SCREEN
ABO/RH(D): B NEG
ANTIBODY SCREEN: NEGATIVE

## 2017-07-04 LAB — MRSA PCR SCREENING: MRSA by PCR: NEGATIVE

## 2017-07-04 SURGERY — HEMIARTHROPLASTY, HIP, DIRECT ANTERIOR APPROACH, FOR FRACTURE
Anesthesia: Monitor Anesthesia Care | Laterality: Left

## 2017-07-04 MED ORDER — DEXTROSE 5 % IV SOLN
INTRAVENOUS | Status: DC | PRN
  Administered 2017-07-04: 25 ug/min via INTRAVENOUS

## 2017-07-04 MED ORDER — CEFAZOLIN SODIUM-DEXTROSE 2-3 GM-%(50ML) IV SOLR
INTRAVENOUS | Status: DC | PRN
  Administered 2017-07-04: 2 g via INTRAVENOUS

## 2017-07-04 MED ORDER — HYDROCODONE-ACETAMINOPHEN 5-325 MG PO TABS
1.0000 | ORAL_TABLET | Freq: Four times a day (QID) | ORAL | Status: DC | PRN
  Administered 2017-07-04 – 2017-07-05 (×2): 2 via ORAL
  Administered 2017-07-05 – 2017-07-06 (×2): 1 via ORAL
  Filled 2017-07-04 (×2): qty 1
  Filled 2017-07-04 (×2): qty 2

## 2017-07-04 MED ORDER — SODIUM CHLORIDE 0.9 % IV SOLN
INTRAVENOUS | Status: DC
  Administered 2017-07-04: 17:00:00 via INTRAVENOUS

## 2017-07-04 MED ORDER — FENTANYL CITRATE (PF) 100 MCG/2ML IJ SOLN
25.0000 ug | INTRAMUSCULAR | Status: AC | PRN
  Administered 2017-07-04 (×2): 25 ug via INTRAVENOUS
  Filled 2017-07-04 (×2): qty 2

## 2017-07-04 MED ORDER — ACETAMINOPHEN 650 MG RE SUPP: 650.0000 mg | Freq: Four times a day (QID) | RECTAL | Status: DC | PRN

## 2017-07-04 MED ORDER — METOCLOPRAMIDE HCL 5 MG PO TABS: 5.0000 mg | ORAL_TABLET | Freq: Three times a day (TID) | ORAL | Status: DC | PRN

## 2017-07-04 MED ORDER — SODIUM CHLORIDE 0.9 % IR SOLN
Status: DC | PRN
  Administered 2017-07-04: 3000 mL

## 2017-07-04 MED ORDER — FENTANYL CITRATE (PF) 100 MCG/2ML IJ SOLN: 25.0000 ug | INTRAMUSCULAR | Status: DC | PRN

## 2017-07-04 MED ORDER — LACTATED RINGERS IV SOLN
INTRAVENOUS | Status: DC
  Administered 2017-07-04: 10:00:00 via INTRAVENOUS

## 2017-07-04 MED ORDER — PHENOL 1.4 % MT LIQD: 1.0000 | OROMUCOSAL | Status: DC | PRN

## 2017-07-04 MED ORDER — METOCLOPRAMIDE HCL 5 MG/ML IJ SOLN: 5.0000 mg | Freq: Three times a day (TID) | INTRAMUSCULAR | Status: DC | PRN

## 2017-07-04 MED ORDER — ONDANSETRON HCL 4 MG/2ML IJ SOLN: 4.0000 mg | Freq: Four times a day (QID) | INTRAMUSCULAR | Status: DC | PRN

## 2017-07-04 MED ORDER — MENTHOL 3 MG MT LOZG: 1.0000 | LOZENGE | OROMUCOSAL | Status: DC | PRN

## 2017-07-04 MED ORDER — 0.9 % SODIUM CHLORIDE (POUR BTL) OPTIME
TOPICAL | Status: DC | PRN
  Administered 2017-07-04: 1000 mL

## 2017-07-04 MED ORDER — SODIUM CHLORIDE 0.9 % IV SOLN
1000.0000 mg | INTRAVENOUS | Status: AC
  Administered 2017-07-04: 1000 mg via INTRAVENOUS
  Filled 2017-07-04: qty 1100

## 2017-07-04 MED ORDER — CEFAZOLIN SODIUM-DEXTROSE 2-4 GM/100ML-% IV SOLN
INTRAVENOUS | Status: AC
  Filled 2017-07-04: qty 100

## 2017-07-04 MED ORDER — PROPOFOL 500 MG/50ML IV EMUL
INTRAVENOUS | Status: DC | PRN
  Administered 2017-07-04: 50 ug/kg/min via INTRAVENOUS

## 2017-07-04 MED ORDER — ONDANSETRON HCL 4 MG PO TABS: 4.0000 mg | ORAL_TABLET | Freq: Four times a day (QID) | ORAL | Status: DC | PRN

## 2017-07-04 MED ORDER — CEFAZOLIN SODIUM-DEXTROSE 2-4 GM/100ML-% IV SOLN
2.0000 g | Freq: Four times a day (QID) | INTRAVENOUS | Status: AC
  Administered 2017-07-04 (×2): 2 g via INTRAVENOUS
  Filled 2017-07-04 (×2): qty 100

## 2017-07-04 MED ORDER — ACETAMINOPHEN 325 MG PO TABS
650.0000 mg | ORAL_TABLET | Freq: Four times a day (QID) | ORAL | Status: DC | PRN
  Administered 2017-07-04 – 2017-07-05 (×2): 650 mg via ORAL
  Filled 2017-07-04 (×2): qty 2

## 2017-07-04 MED ORDER — LACTATED RINGERS IV SOLN
INTRAVENOUS | Status: DC | PRN
  Administered 2017-07-04: 10:00:00 via INTRAVENOUS

## 2017-07-04 MED ORDER — MORPHINE SULFATE (PF) 2 MG/ML IV SOLN: 0.5000 mg | INTRAVENOUS | Status: DC | PRN

## 2017-07-04 MED ORDER — ONDANSETRON HCL 4 MG/2ML IJ SOLN
INTRAMUSCULAR | Status: DC | PRN
  Administered 2017-07-04: 4 mg via INTRAVENOUS

## 2017-07-04 MED ORDER — PROPOFOL 10 MG/ML IV BOLUS
INTRAVENOUS | Status: DC | PRN
  Administered 2017-07-04: 50 mg via INTRAVENOUS

## 2017-07-04 MED ORDER — DIPHENHYDRAMINE HCL 12.5 MG/5ML PO ELIX
12.5000 mg | ORAL_SOLUTION | Freq: Once | ORAL | Status: AC
  Administered 2017-07-04: 12.5 mg via ORAL
  Filled 2017-07-04: qty 10

## 2017-07-04 MED ORDER — PANTOPRAZOLE SODIUM 40 MG PO TBEC
40.0000 mg | DELAYED_RELEASE_TABLET | Freq: Every day | ORAL | Status: DC
  Administered 2017-07-04 – 2017-07-06 (×3): 40 mg via ORAL
  Filled 2017-07-04 (×3): qty 1

## 2017-07-04 MED ORDER — ASPIRIN EC 325 MG PO TBEC
325.0000 mg | DELAYED_RELEASE_TABLET | Freq: Two times a day (BID) | ORAL | Status: DC
  Administered 2017-07-04 – 2017-07-05 (×3): 325 mg via ORAL
  Filled 2017-07-04 (×3): qty 1

## 2017-07-04 SURGICAL SUPPLY — 54 items
BLADE CLIPPER SURG (BLADE) IMPLANT
BLADE SAW SGTL 73X25 THK (BLADE) ×2 IMPLANT
CAPT HIP HEMI 1 ×1 IMPLANT
CHLORAPREP W/TINT 26ML (MISCELLANEOUS) ×2 IMPLANT
CLOSURE STERI-STRIP 1/4X4 (GAUZE/BANDAGES/DRESSINGS) ×1 IMPLANT
COVER SURGICAL LIGHT HANDLE (MISCELLANEOUS) ×2 IMPLANT
DRAPE HALF SHEET 40X57 (DRAPES) ×2 IMPLANT
DRAPE IMP U-DRAPE 54X76 (DRAPES) ×2 IMPLANT
DRAPE INCISE IOBAN 66X45 STRL (DRAPES) IMPLANT
DRAPE ORTHO SPLIT 77X108 STRL (DRAPES) ×4
DRAPE SURG ORHT 6 SPLT 77X108 (DRAPES) ×2 IMPLANT
DRAPE U-SHAPE 47X51 STRL (DRAPES) ×2 IMPLANT
DRILL BIT 7/64X5 (BIT) ×2 IMPLANT
DRSG AQUACEL AG ADV 3.5X 6 (GAUZE/BANDAGES/DRESSINGS) ×1 IMPLANT
ELECT BLADE 6.5 EXT (BLADE) IMPLANT
ELECT REM PT RETURN 9FT ADLT (ELECTROSURGICAL) ×2
ELECTRODE REM PT RTRN 9FT ADLT (ELECTROSURGICAL) ×1 IMPLANT
EVACUATOR 1/8 PVC DRAIN (DRAIN) IMPLANT
GLOVE BIO SURGEON STRL SZ7 (GLOVE) ×2 IMPLANT
GLOVE BIO SURGEON STRL SZ7.5 (GLOVE) ×2 IMPLANT
GLOVE BIOGEL PI IND STRL 8 (GLOVE) ×1 IMPLANT
GLOVE BIOGEL PI INDICATOR 8 (GLOVE) ×1
GLOVE SS BIOGEL STRL SZ 8 (GLOVE) ×1 IMPLANT
GLOVE SUPERSENSE BIOGEL SZ 8 (GLOVE) ×1
GOWN STRL REUS W/ TWL LRG LVL3 (GOWN DISPOSABLE) ×3 IMPLANT
GOWN STRL REUS W/TWL LRG LVL3 (GOWN DISPOSABLE) ×6
HANDPIECE INTERPULSE COAX TIP (DISPOSABLE)
HOOD PEEL AWAY FACE SHEILD DIS (HOOD) ×4 IMPLANT
IMMOBILIZER KNEE 22 (SOFTGOODS) ×1 IMPLANT
IMMOBILIZER KNEE 22 UNIV (SOFTGOODS) IMPLANT
KIT BASIN OR (CUSTOM PROCEDURE TRAY) ×2 IMPLANT
KIT ROOM TURNOVER OR (KITS) ×2 IMPLANT
MANIFOLD NEPTUNE II (INSTRUMENTS) ×2 IMPLANT
NDL MAYO TROCAR (NEEDLE) ×1 IMPLANT
NEEDLE MAYO TROCAR (NEEDLE) ×2 IMPLANT
NS IRRIG 1000ML POUR BTL (IV SOLUTION) ×2 IMPLANT
PACK TOTAL JOINT (CUSTOM PROCEDURE TRAY) ×2 IMPLANT
PACK UNIVERSAL I (CUSTOM PROCEDURE TRAY) ×2 IMPLANT
PAD ARMBOARD 7.5X6 YLW CONV (MISCELLANEOUS) ×4 IMPLANT
PASSER SUT SWANSON 36MM LOOP (INSTRUMENTS) IMPLANT
PRESSURIZER FEMORAL UNIV (MISCELLANEOUS) IMPLANT
SET HNDPC FAN SPRY TIP SCT (DISPOSABLE) IMPLANT
STAPLER VISISTAT 35W (STAPLE) ×2 IMPLANT
SUT ETHIBOND NAB CT1 #1 30IN (SUTURE) ×8 IMPLANT
SUT VIC AB 0 CT1 27 (SUTURE) ×2
SUT VIC AB 0 CT1 27XBRD ANBCTR (SUTURE) ×1 IMPLANT
SUT VIC AB 1 CTB1 27 (SUTURE) ×4 IMPLANT
SUT VIC AB 2-0 CT1 27 (SUTURE) ×2
SUT VIC AB 2-0 CT1 TAPERPNT 27 (SUTURE) ×1 IMPLANT
TOWEL OR 17X24 6PK STRL BLUE (TOWEL DISPOSABLE) ×2 IMPLANT
TOWEL OR 17X26 10 PK STRL BLUE (TOWEL DISPOSABLE) ×2 IMPLANT
TOWER CARTRIDGE SMART MIX (DISPOSABLE) IMPLANT
TRAY FOLEY W/METER SILVER 16FR (SET/KITS/TRAYS/PACK) IMPLANT
WATER STERILE IRR 1000ML POUR (IV SOLUTION) ×8 IMPLANT

## 2017-07-04 NOTE — Op Note (Signed)
Procedure(s): ARTHROPLASTY BIPOLAR HIP (HEMIARTHROPLASTY) Procedure Note  Melissa Snyder female 84 y.o. 07/04/2017  Procedure(s) and Anesthesia Type:    * HEMIARTHROPLASTY  LEFT HIP - General  Surgeon(s) and Role:    Tania Ade, MD - Primary   Indications:  84 y.o. female s/p fall with left hip fracture. Indicated for surgery to promote early ambulation, pain control and prevent complications of bed rest.     Surgeon: Isabella Stalling   Assistants: Jeanmarie Hubert PA-C (Danielle was present and scrubbed throughout the procedure and was essential in positioning, retraction, exposure, and closure)  Anesthesia: Spinal anesthesia    Procedure Detail  ARTHROPLASTY HIP (HEMIARTHROPLASTY)  Findings: [place] basic press-fit stem size 4 with a size 50 head.  Excellent stability  Estimated Blood Loss:  200 mL         Drains: none  Blood Given: none          Specimens: none        Complications:  * No complications entered in OR log *         Disposition: PACU - hemodynamically stable.         Condition: stable    Procedure:  The patient was identified in the preoperative  holding area where I personally marked the operative site after  verifying site side and procedure with the patient. She was taken back  to the operating room where general anesthesia was induced without  Complication. The patient was placed in lateral decubitus position with the  left side up. The left lower extremity was then prepped and draped in standard sterile fashion. The patient did receive IV antibiotics prior to the  incision.   After the appropriate time-out, an approximately 12-cm  incision was made over the posterior third of the greater trochanter.  Dissection was carried down to the fascia which was split longitudinally  in line with the incision. The piriformis was tagged and the external  rotators were then taken down off the posterior greater trochanter in 1  sheath  with the posterior capsule. The fracture was exposed. Posterior  capsule and external rotators were split just below the piriformis down  to the level of the acetabulum. Great care was taken to protect the  sciatic nerve. The proximal femoral cut was then made using the cut  guide and the femoral head was then removed and sized and felt to be 50.  The proximal femur was then prepared by first using an intramedullary  canal finder, a lateralizer and then sequentially broaching from 1 to 4.   The size 50 head with +0 offset neck was then placed and a trial reduction was performed. It was felt  to be excellent in terms of the soft tissue tension. I was able to  bring up to 90 degrees of flexion and 70 degrees internal rotation  without any instability. Leg lengths were felt to be appropriate. The  hip was dislocated. The broach was then removed.  The size 4 stem was then press-fit into place in approximately 15-20  degrees anteversion. The size 50+0 offset head was then placed and impacted. It  was then reduced after ensuring that there was nothing in the  acetabulum. The hip reduced nicely and again it was taken through the  trial. I was able to flex to 90 degrees, internal rotation to 70  without any instability. Soft tissue tension was excellent and lengths  were felt to be equal. The joint was then  copiously irrigated with  normal saline with pulse lavage and then the external rotators were  repaired using Ethibond sutures through the greater trochanter and tied  over a bone bridge. The deep fascia was then closed using #1 Vicryl in  running fashion proximally and distally. The skin was then closed using  2-0 Vicryl in deep dermal layer and staples for skin closure. Sterile  dressings were then applied including Mepilex dressing. The patient was  then placed in an abduction pillow, rolled into supine position and  extubated. He was then transferred to the PACU in stable condition.     POSTOPERATIVE PLAN: The patient will be weightbearing as tolerated on the operative Extremity with posterior hip precautions and will have DVT prophylaxis of aspirin twice daily and SCDs.

## 2017-07-04 NOTE — Progress Notes (Signed)
PT Cancellation Note  Patient Details Name: Melissa Snyder MRN: [id number] DOB: 23-Oct-1933   Cancelled Treatment:     Attempted to visit patient at 0900 this AM, patient out of room to OR. Will re-attempt later today as time permits.   Reinaldo Berber, PT, DPT Acute Rehab Services Pager: [phone]     Reinaldo Berber 07/04/2017, 11:54 AM

## 2017-07-04 NOTE — Transfer of Care (Signed)
Immediate Anesthesia Transfer of Care Note  Patient: Melissa Snyder  Procedure(s) Performed: ARTHROPLASTY BIPOLAR HIP (HEMIARTHROPLASTY) (Left )  Patient Location: PACU  Anesthesia Type:Spinal  Level of Consciousness: awake, alert , oriented and patient cooperative  Airway & Oxygen Therapy: Patient Spontanous Breathing and Patient connected to face mask oxygen  Post-op Assessment: Report given to RN and Post -op Vital signs reviewed and stable  Post vital signs: Reviewed and stable  Last Vitals:  Vitals:   07/03/17 1938 07/04/17 0540  BP: (!) 154/72 (!) 137/56  Pulse: (!) 44 63  Resp: 18 16  Temp: 37.4 C 36.8 C  SpO2: 97%     Last Pain:  Vitals:   07/04/17 0540  TempSrc: Oral  PainSc:          Complications: No apparent anesthesia complications

## 2017-07-04 NOTE — Anesthesia Postprocedure Evaluation (Signed)
Anesthesia Post Note  Patient: Melissa Snyder  Procedure(s) Performed: ARTHROPLASTY BIPOLAR HIP (HEMIARTHROPLASTY) (Left )     Patient location during evaluation: PACU Anesthesia Type: MAC and Spinal Level of consciousness: awake and alert Pain management: pain level controlled Vital Signs Assessment: post-procedure vital signs reviewed and stable Respiratory status: spontaneous breathing and respiratory function stable Cardiovascular status: blood pressure returned to baseline and stable Postop Assessment: spinal receding Anesthetic complications: no    Last Vitals:  Vitals:   07/04/17 1306 07/04/17 1315  BP: (!) 166/60   Pulse: (!) 55 (!) 56  Resp: 20 16  Temp:  36.7 C  SpO2: 100% 100%    Last Pain:  Vitals:   07/04/17 0540  TempSrc: Oral  PainSc:                  Antonius Hartlage DANIEL

## 2017-07-04 NOTE — Anesthesia Procedure Notes (Signed)
Procedure Name: MAC Performed by: Valda Favia, CRNA Pre-anesthesia Checklist: Patient identified, Emergency Drugs available, Suction available and Patient being monitored Patient Re-evaluated:Patient Re-evaluated prior to induction Oxygen Delivery Method: Simple face mask Preoxygenation: Pre-oxygenation with 100% oxygen Placement Confirmation: positive ETCO2 Dental Injury: Teeth and Oropharynx as per pre-operative assessment

## 2017-07-04 NOTE — Anesthesia Preprocedure Evaluation (Addendum)
Anesthesia Evaluation  Patient identified by MRN, date of birth, ID band Patient awake    Reviewed: Allergy & Precautions, NPO status , Patient's Chart, lab work & pertinent test results  History of Anesthesia Complications Negative for: history of anesthetic complications  Airway Mallampati: II  TM Distance: >3 FB Neck ROM: Full    Dental no notable dental hx. (+) Dental Advisory Given   Pulmonary neg pulmonary ROS,    Pulmonary exam normal        Cardiovascular hypertension, Normal cardiovascular exam+ dysrhythmias   Study Conclusions Echo 2015 - Left ventricle: Poor image quality may be inferior hypokinesis. The cavity size was normal. Wall thickness was normal. Systolic function was normal. The estimated ejection fraction was in the range of 50% to 55%. - Aortic valve: There was mild regurgitation. - Mitral valve: There was mild regurgitation.   Neuro/Psych CVA negative psych ROS   GI/Hepatic Neg liver ROS, GERD  ,  Endo/Other  Hypothyroidism   Renal/GU negative Renal ROS     Musculoskeletal   Abdominal   Peds  Hematology   Anesthesia Other Findings   Reproductive/Obstetrics                           Anesthesia Physical Anesthesia Plan  ASA: III  Anesthesia Plan: MAC and Spinal   Post-op Pain Management:    Induction:   PONV Risk Score and Plan: 2  Airway Management Planned: Natural Airway and Simple Face Mask  Additional Equipment:   Intra-op Plan:   Post-operative Plan:   Informed Consent: I have reviewed the patients History and Physical, chart, labs and discussed the procedure including the risks, benefits and alternatives for the proposed anesthesia with the patient or authorized representative who has indicated his/her understanding and acceptance.   Dental advisory given  Plan Discussed with: CRNA and Anesthesiologist  Anesthesia Plan Comments:         Anesthesia Quick Evaluation

## 2017-07-04 NOTE — Interval H&P Note (Signed)
History and Physical Interval Note:  07/04/2017 9:42 AM  Melissa Snyder  has presented today for surgery, with the diagnosis of left hip fracture  The various methods of treatment have been discussed with the patient and family. After consideration of risks, benefits and other options for treatment, the patient has consented to  Procedure(s): ARTHROPLASTY BIPOLAR HIP (HEMIARTHROPLASTY) (Left) as a surgical intervention .  The patient's history has been reviewed, patient examined, no change in status, stable for surgery.  I have reviewed the patient's chart and labs.  Questions were answered to the patient's satisfaction.     Isabella Stalling

## 2017-07-04 NOTE — Progress Notes (Signed)
PROGRESS NOTE    Melissa Snyder  IRS:[id number] DOB: Jan 16, 1934 DOA: 07/03/2017 PCP: [name], MD    Brief Narrative: 84 year old female with a history of recurrent adenocarcinoma status post segmentectomy of the left lower lobe in 2016 with recurrence, now pending radiation treatments, dyslipidemia, hypertension who presents to the ED today after a fall. Patient was leaving [employer] exercise class when she slipped on a patch of ice. She fell on her left side and sustained an abrasion on her left palm. She denied any palpitations, chest pain shortness of breath loss of consciousness. Patient is fairly active at baseline. She denies any hemoptysis. Recently diagnosed with recurrence of adenocarcinoma and was supposed to start radiation treatments on Tuesday next week by Dr. Lisbeth Renshaw. Patient has been evaluated by Dr. Tamera Punt in the ED was requesting transfer to [facility] for repair.    Assessment & Plan:   Principal Problem:   Closed fracture of acetabulum with dislocation of left hip (HCC) Active Problems:   Central retinal artery occlusion of left eye   HTN (hypertension)   Hypothyroidism   GERD (gastroesophageal reflux disease)   Hypertension   S/p left hip fracture   Closed fracture of acetabulum with dislocation of left hip  Underwent left l hip hemiarthoplasty 1-31. DVT prophylaxis per ortho.  Bowel management; senokot.   HTN; continue with lisinopril/ atenolol.  PRM Hydralazine.   Hypothyroidism; continue with synthroid.   GERD; start PPI daily   Recurrent adenocarcinoma, seen by Dr. Kyung Rudd, plan was to start radiation treatments next week  Back pain; tylenol prn. If not improved could consider x ray.       DVT prophylaxis: aspirin per ortho  Code Status: full code.  Family Communication: care discussed with daughter  Disposition Plan: to be determine   Consultants:   Dr Tamera Punt.    Procedures:  Left hip hemiarthroplasty    Antimicrobials:    none   Subjective: Complaining of back pain since yesterday, she think is for been lying down in bed.  Report mild supra-pubic pain. Denies dysuria.   Objective: Vitals:   07/04/17 1300 07/04/17 1306 07/04/17 1315 07/04/17 1404  BP:  (!) 166/60  (!) 180/63  Pulse: (!) 54 (!) 55 (!) 56 74  Resp: 18 20 16    Temp:   98.1 F (36.7 C) (!) 97.5 F (36.4 C)  TempSrc:    Oral  SpO2: 100% 100% 100% 98%    Intake/Output Summary (Last 24 hours) at 07/04/2017 1454 Last data filed at 07/04/2017 1300 Gross per 24 hour  Intake 880 ml  Output 2500 ml  Net -1620 ml   There were no vitals filed for this visit.  Examination:  General exam: Appears calm and comfortable  Respiratory system: Clear to auscultation. Respiratory effort normal. Cardiovascular system: S1 & S2 heard, RRR. No JVD, murmurs, rubs, gallops or clicks. No pedal edema. Gastrointestinal system: Abdomen is nondistended, soft and nontender. No organomegaly or masses felt. Normal bowel sounds heard. Central nervous system: Alert and oriented. No focal neurological deficits. Extremities: left hip with clean dressing  Skin: No rashes, lesions or ulcers    Data Reviewed: I have personally reviewed following labs and imaging studies  CBC: Recent Labs  Lab 07/03/17 1214  WBC 8.0  NEUTROABS 6.8  HGB 12.3  HCT 36.6  MCV 86.7  PLT 009   Basic Metabolic Panel: Recent Labs  Lab 07/03/17 1214  NA 140  K 3.8  CL 104  CO2 29  GLUCOSE  95  BUN 16  CREATININE 0.56  CALCIUM 9.3  MG 1.9   GFR: Estimated Creatinine Clearance: 50.8 mL/min (by C-G formula based on SCr of 0.56 mg/dL). Liver Function Tests: No results for input(s): AST, ALT, ALKPHOS, BILITOT, PROT, ALBUMIN in the last 168 hours. No results for input(s): LIPASE, AMYLASE in the last 168 hours. No results for input(s): AMMONIA in the last 168 hours. Coagulation Profile: Recent Labs  Lab 07/03/17 1214  INR 0.96   Cardiac Enzymes: No results for  input(s): CKTOTAL, CKMB, CKMBINDEX, TROPONINI in the last 168 hours. BNP (last 3 results) No results for input(s): PROBNP in the last 8760 hours. HbA1C: No results for input(s): HGBA1C in the last 72 hours. CBG: No results for input(s): GLUCAP in the last 168 hours. Lipid Profile: No results for input(s): CHOL, HDL, LDLCALC, TRIG, CHOLHDL, LDLDIRECT in the last 72 hours. Thyroid Function Tests: Recent Labs    07/03/17 1343  TSH 2.051   Anemia Panel: No results for input(s): VITAMINB12, FOLATE, FERRITIN, TIBC, IRON, RETICCTPCT in the last 72 hours. Sepsis Labs: No results for input(s): PROCALCITON, LATICACIDVEN in the last 168 hours.  Recent Results (from the past 240 hour(s))  MRSA PCR Screening     Status: None   Collection Time: 07/03/17  8:16 PM  Result Value Ref Range Status   MRSA by PCR NEGATIVE NEGATIVE Final    Comment:        The GeneXpert MRSA Assay (FDA approved for NASAL specimens only), is one component of a comprehensive MRSA colonization surveillance program. It is not intended to diagnose MRSA infection nor to guide or monitor treatment for MRSA infections.          Radiology Studies: Dg Chest 2 View  Result Date: 07/03/2017 CLINICAL DATA:  Fall today, LEFT hip pain. EXAM: CHEST  2 VIEW COMPARISON:  06/18/2017. FINDINGS: Partial exclusion of the RIGHT lung base. Unremarkable cardiomediastinal silhouette. No consolidation or edema. No effusion or pneumothorax. Skeletal osteopenia. IMPRESSION: No active cardiopulmonary disease.Stable chest. Electronically Signed   By: Staci Righter M.D.   On: 07/03/2017 10:45   Pelvis Portable  Result Date: 07/04/2017 CLINICAL DATA:  The patient suffered a left femoral neck fracture in a fall 07/03/2016. Status post left hip replacement today. Initial encounter. EXAM: PORTABLE PELVIS 1-2 VIEWS COMPARISON:  Plain films left hip 07/03/2017. FINDINGS: New bipolar left hip hemiarthroplasty is in place. The device is located.  No fracture. Gas in the soft tissues and surgical staples noted. No new abnormality. IMPRESSION: Status post left hip replacement.  No acute finding. Electronically Signed   By: Inge Rise M.D.   On: 07/04/2017 12:38   Dg Hip Unilat With Pelvis 2-3 Views Left  Result Date: 07/03/2017 CLINICAL DATA:  84 year old female status post fall today with left hip pain. Breast and lung cancer. EXAM: DG HIP (WITH OR WITHOUT PELVIS) 2-3V LEFT COMPARISON:  PET-CT 03/04/2017 FINDINGS: Left femoral neck fracture with mild impaction and cephalad displacement. The left femoral head remains normally located. The intertrochanteric segment of the left femur appears intact. The pelvis appears intact. The proximal right femur appears grossly intact. Bone mineralization is within normal limits for age. Iliofemoral calcified atherosclerosis. Negative visible bowel gas pattern. IMPRESSION: Acute left femoral neck fracture with varus impaction. Electronically Signed   By: Genevie Ann M.D.   On: 07/03/2017 10:47        Scheduled Meds: . aspirin EC  325 mg Oral BID  . atenolol  50 mg Oral  QHS  . cholecalciferol  2,000 Units Oral Daily  . docusate sodium  100 mg Oral BID  . levothyroxine  112 mcg Oral QAC breakfast  . lisinopril  10 mg Oral Daily  . senna  1 tablet Oral BID   Continuous Infusions: . sodium chloride 50 mL/hr at 07/03/17 2058  . sodium chloride    . ceFAZolin    .  ceFAZolin (ANCEF) IV    . lactated ringers 10 mL/hr at 07/04/17 1004     LOS: 1 day    Time spent: 35 minutes.     Elmarie Shiley, MD [facility] Pager [phone]  If 7PM-7AM, please contact night-coverage [email or web address] Password [email or web address] 07/04/2017, 2:54 PM

## 2017-07-04 NOTE — Progress Notes (Signed)
Orthopedic Tech Progress Note Patient Details:  Melissa Snyder May 10, 1934 [id number] Patient exceeds age requirements for overhead frame. Patient ID: Melissa Snyder, female   DOB: [birth year], 84 y.o.   MRN: [id number]   Braulio Bosch 07/04/2017, 2:06 PM

## 2017-07-05 ENCOUNTER — Encounter (HOSPITAL_COMMUNITY): Payer: Self-pay | Admitting: Orthopedic Surgery

## 2017-07-05 LAB — BASIC METABOLIC PANEL
Anion gap: 7 (ref 5–15)
BUN: 7 mg/dL (ref 6–20)
CALCIUM: 8 mg/dL — AB (ref 8.9–10.3)
CHLORIDE: 107 mmol/L (ref 101–111)
CO2: 25 mmol/L (ref 22–32)
CREATININE: 0.62 mg/dL (ref 0.44–1.00)
GFR calc Af Amer: >60 mL/min (ref >60)
GFR calc non Af Amer: >60 mL/min (ref >60)
GLUCOSE: 110 mg/dL — AB (ref 65–99)
Potassium: 3.7 mmol/L (ref 3.5–5.1)
Sodium: 139 mmol/L (ref 135–145)

## 2017-07-05 LAB — CBC
HEMATOCRIT: 33.5 % — AB (ref 36.0–46.0)
HEMOGLOBIN: 10.9 g/dL — AB (ref 12.0–15.0)
MCH: 28.3 pg (ref 26.0–34.0)
MCHC: 32.5 g/dL (ref 30.0–36.0)
MCV: 87 fL (ref 78.0–100.0)
Platelets: 137 10*3/uL — ABNORMAL LOW (ref 150–400)
RBC: 3.85 MIL/uL — ABNORMAL LOW (ref 3.87–5.11)
RDW: 13.9 % (ref 11.5–15.5)
WBC: 6.2 10*3/uL (ref 4.0–10.5)

## 2017-07-05 LAB — VITAMIN D 25 HYDROXY (VIT D DEFICIENCY, FRACTURES): Vit D, 25-Hydroxy: 40 ng/mL (ref 30.0–100.0)

## 2017-07-05 MED ORDER — ENOXAPARIN SODIUM 40 MG/0.4ML ~~LOC~~ SOLN
40.0000 mg | SUBCUTANEOUS | Status: DC
  Administered 2017-07-05: 40 mg via SUBCUTANEOUS
  Filled 2017-07-05: qty 0.4

## 2017-07-05 MED ORDER — HYDROCODONE-ACETAMINOPHEN 5-325 MG PO TABS: 1.0000 | ORAL_TABLET | Freq: Four times a day (QID) | ORAL | 0 refills | Status: DC | PRN

## 2017-07-05 MED ORDER — ASPIRIN 325 MG PO TBEC: 325.0000 mg | DELAYED_RELEASE_TABLET | Freq: Two times a day (BID) | ORAL | 0 refills | Status: DC

## 2017-07-05 NOTE — Progress Notes (Signed)
   PATIENT ID: Melissa Snyder   1 Day Post-Op Procedure(s) (LRB): ARTHROPLASTY BIPOLAR HIP (HEMIARTHROPLASTY) (Left)  Subjective: Doing well today, minimal discomfort. Reports having spasming in the left thigh last night. No other complaints or concerns.   Objective:  Vitals:   07/04/17 2300 07/05/17 0608  BP: 133/60 (!) 161/80  Pulse: 64 70  Resp: 17   Temp: 98.8 F (37.1 C) 98.8 F (37.1 C)  SpO2: 96% 97%     L hip dressing c/d/i Wiggles toes, distally NVI  Labs:  Recent Labs    07/03/17 1214 07/05/17 0538  HGB 12.3 10.9*   Recent Labs    07/03/17 1214 07/05/17 0538  WBC 8.0 6.2  RBC 4.22 3.85*  HCT 36.6 33.5*  PLT 157 137*   Recent Labs    07/03/17 1214 07/05/17 0538  NA 140 139  K 3.8 3.7  CL 104 107  CO2 29 25  BUN 16 7  CREATININE 0.56 0.62  GLUCOSE 95 110*  CALCIUM 9.3 8.0*    Assessment and Plan: 1 day s/p L hip hemiarthroplasty for fracture Thigh spasm- apply heat if able, would like to hold off on muscle relaxants due to side effects and patient agrees ABLA- expected PO Up with PT today Minimize pain rx Medical mgmt per hospitalist and we appreciate their help WBAT   VTE proph: asa, scds

## 2017-07-05 NOTE — Plan of Care (Signed)
  Nutrition: Adequate nutrition will be maintained 07/05/2017 1115 - Progressing by Williams Che, RN   Elimination: Will not experience complications related to bowel motility 07/05/2017 1115 - Progressing by Williams Che, RN   Pain Managment: General experience of comfort will improve 07/05/2017 1115 - Progressing by Williams Che, RN   Safety: Ability to remain free from injury will improve 07/05/2017 1115 - Progressing by Williams Che, RN   Clinical Measurements: Postoperative complications will be avoided or minimized 07/05/2017 1115 - Progressing by Williams Che, RN

## 2017-07-05 NOTE — Progress Notes (Signed)
PROGRESS NOTE    LOVEAH LIKE  LGX:[id number] DOB: May 09, 1934 DOA: 07/03/2017 PCP: [name], MD    Brief Narrative: 85 year old female with a history of recurrent adenocarcinoma status post segmentectomy of the left lower lobe in 2016 with recurrence, now pending radiation treatments, dyslipidemia, hypertension who presents to the ED today after a fall. Patient was leaving [employer] exercise class when she slipped on a patch of ice. She fell on her left side and sustained an abrasion on her left palm. She denied any palpitations, chest pain shortness of breath loss of consciousness. Patient is fairly active at baseline. She denies any hemoptysis. Recently diagnosed with recurrence of adenocarcinoma and was supposed to start radiation treatments on Tuesday next week by Dr. Lisbeth Renshaw. Patient has been evaluated by Dr. Tamera Punt in the ED was requesting transfer to [facility] for repair.    Assessment & Plan:   Principal Problem:   Closed fracture of acetabulum with dislocation of left hip (HCC) Active Problems:   Central retinal artery occlusion of left eye   HTN (hypertension)   Hypothyroidism   GERD (gastroesophageal reflux disease)   Hypertension   S/p left hip fracture   Closed fracture of acetabulum with dislocation of left hip  Underwent left l hip hemiarthoplasty 1-31. DVT prophylaxis per ortho. Aspirin.  Bowel management; senokot.   Acute blood loss anemia;  Post op, expected.  Repeat hb in am.   HTN; continue with lisinopril/ atenolol.  PRM Hydralazine.   Hypothyroidism; continue with synthroid.   GERD; start PPI daily   Recurrent adenocarcinoma, seen by Dr. Kyung Rudd, plan was to start radiation treatments next week  Back pain; tylenol prn. Improved.       DVT prophylaxis: aspirin per ortho  Code Status: full code.  Family Communication: care discussed with daughter  Disposition Plan: to be determine   Consultants:   Dr Tamera Punt.    Procedures:  Left  hip hemiarthroplasty    Antimicrobials:   none   Subjective: She is feeling better regarding back pain and supra-pubic pain.  She report muscle cramps left leg   Objective: Vitals:   07/04/17 2024 07/04/17 2300 07/05/17 0608 07/05/17 1300  BP: (!) 175/74 133/60 (!) 161/80 (!) 154/74  Pulse: 82 64 70 74  Resp: 18 17  17   Temp: 98.6 F (37 C) 98.8 F (37.1 C) 98.8 F (37.1 C) 98.8 F (37.1 C)  TempSrc: Oral Oral Oral Oral  SpO2: 96% 96% 97% 98%    Intake/Output Summary (Last 24 hours) at 07/05/2017 1445 Last data filed at 07/05/2017 1300 Gross per 24 hour  Intake 720 ml  Output 3700 ml  Net -2980 ml   There were no vitals filed for this visit.  Examination:  General exam: NAD Respiratory system: CTA Cardiovascular system: S 1, S 2 RRR Gastrointestinal system: BS present, soft, nt Central nervous system: non focal.  Extremities: left hip with immobilizer.  Skin: No rashes, lesions or ulcers    Data Reviewed: I have personally reviewed following labs and imaging studies  CBC: Recent Labs  Lab 07/03/17 1214 07/05/17 0538  WBC 8.0 6.2  NEUTROABS 6.8  --   HGB 12.3 10.9*  HCT 36.6 33.5*  MCV 86.7 87.0  PLT 157 814*   Basic Metabolic Panel: Recent Labs  Lab 07/03/17 1214 07/05/17 0538  NA 140 139  K 3.8 3.7  CL 104 107  CO2 29 25  GLUCOSE 95 110*  BUN 16 7  CREATININE 0.56 0.62  CALCIUM 9.3 8.0*  MG 1.9  --    GFR: Estimated Creatinine Clearance: 50.8 mL/min (by C-G formula based on SCr of 0.62 mg/dL). Liver Function Tests: No results for input(s): AST, ALT, ALKPHOS, BILITOT, PROT, ALBUMIN in the last 168 hours. No results for input(s): LIPASE, AMYLASE in the last 168 hours. No results for input(s): AMMONIA in the last 168 hours. Coagulation Profile: Recent Labs  Lab 07/03/17 1214  INR 0.96   Cardiac Enzymes: No results for input(s): CKTOTAL, CKMB, CKMBINDEX, TROPONINI in the last 168 hours. BNP (last 3 results) No results for input(s):  PROBNP in the last 8760 hours. HbA1C: No results for input(s): HGBA1C in the last 72 hours. CBG: No results for input(s): GLUCAP in the last 168 hours. Lipid Profile: No results for input(s): CHOL, HDL, LDLCALC, TRIG, CHOLHDL, LDLDIRECT in the last 72 hours. Thyroid Function Tests: Recent Labs    07/03/17 1343  TSH 2.051   Anemia Panel: No results for input(s): VITAMINB12, FOLATE, FERRITIN, TIBC, IRON, RETICCTPCT in the last 72 hours. Sepsis Labs: No results for input(s): PROCALCITON, LATICACIDVEN in the last 168 hours.  Recent Results (from the past 240 hour(s))  MRSA PCR Screening     Status: None   Collection Time: 07/03/17  8:16 PM  Result Value Ref Range Status   MRSA by PCR NEGATIVE NEGATIVE Final    Comment:        The GeneXpert MRSA Assay (FDA approved for NASAL specimens only), is one component of a comprehensive MRSA colonization surveillance program. It is not intended to diagnose MRSA infection nor to guide or monitor treatment for MRSA infections.          Radiology Studies: Pelvis Portable  Result Date: 07/04/2017 CLINICAL DATA:  The patient suffered a left femoral neck fracture in a fall 07/03/2016. Status post left hip replacement today. Initial encounter. EXAM: PORTABLE PELVIS 1-2 VIEWS COMPARISON:  Plain films left hip 07/03/2017. FINDINGS: New bipolar left hip hemiarthroplasty is in place. The device is located. No fracture. Gas in the soft tissues and surgical staples noted. No new abnormality. IMPRESSION: Status post left hip replacement.  No acute finding. Electronically Signed   By: Inge Rise M.D.   On: 07/04/2017 12:38        Scheduled Meds: . aspirin EC  325 mg Oral BID  . atenolol  50 mg Oral QHS  . cholecalciferol  2,000 Units Oral Daily  . docusate sodium  100 mg Oral BID  . levothyroxine  112 mcg Oral QAC breakfast  . lisinopril  10 mg Oral Daily  . pantoprazole  40 mg Oral Daily  . senna  1 tablet Oral BID   Continuous  Infusions: . sodium chloride 50 mL/hr at 07/03/17 2058  . sodium chloride 100 mL/hr at 07/04/17 1713  . lactated ringers Stopped (07/04/17 1729)     LOS: 2 days    Time spent: 35 minutes.     Elmarie Shiley, MD [facility] Pager [phone]  If 7PM-7AM, please contact night-coverage [email or web address] Password [email or web address] 07/05/2017, 2:45 PM

## 2017-07-05 NOTE — Clinical Social Work Note (Signed)
Clinical Social Work Assessment  Patient Details  Name: Melissa Snyder MRN: [id number] Date of Birth: [birth year]  Date of referral:  07/05/17               Reason for consult:  Facility Placement                Permission sought to share information with:  Chartered certified accountant granted to share information::  Yes, Verbal Permission Granted  Name::     Marybeth Dandy  Agency::  SNF  Relationship::  spouse  Contact Information:     Housing/Transportation Living arrangements for the past 2 months:  [place] of Information:  Patient Patient Interpreter Needed:  None Criminal Activity/Legal Involvement Pertinent to Current Situation/Hospitalization:  No - Comment as needed Significant Relationships:  Spouse, Other Family Members, Friend Lives with:  Spouse Do you feel safe going back to the place where you live?  No Need for family participation in patient care:  No (Coment)  Care giving concerns:  Pt has new impairment and will need short term rehab at discharge. Pt resides with spouse and was ambulating independently, independent with ADL's.  Pt has lung cancer and reports needing treatment at [facility] starting next week. Patient unsure if she will be able to go given her new impairment.  CSW obtained permission to send to local SNF's. Pt has never received SNF care before. CSW explained SNF options/placement. Pt has [employer] and CSW explained the insuracne role/process. Pt agreed to same. CSW provided patient a SNF list and will f/u with offers once received.  Social Worker assessment / plan:  CSW will f/u for disposition.   Employment status:  Retired Nurse, adult PT Recommendations:  [place] / Referral to community resources:  [place]  Patient/Family's Response to care:  Patient appreciative of [place] meeting to discuss discharge plans. Pt agreeable with SNF at  discharge.  Patient/Family's Understanding of and Emotional Response to Diagnosis, Current Treatment, and Prognosis:  Patient has good understanding of diagnosis and impairment. Pt agreeable to SNF and is planning to have cancer treatment at [facility]. Pt unsure if that will be be delayed until she has completed short rehab.  CSW will assist with disposition.  Emotional Assessment Appearance:  Appears stated age Attitude/Demeanor/Rapport:  (Cooperative) Affect (typically observed):  Accepting, Appropriate Orientation:  Oriented to Situation, Oriented to  Time, Oriented to Place, Oriented to Self Alcohol / Substance use:  Not Applicable Psych involvement (Current and /or in the community):  No (Comment)  Discharge Needs  Concerns to be addressed:  Discharge Planning Concerns Readmission within the last 30 days:  No Current discharge risk:  Dependent with Mobility, Physical Impairment Barriers to Discharge:  No Barriers Identified   Normajean Baxter, LCSW 07/05/2017, 2:32 PM

## 2017-07-05 NOTE — Evaluation (Signed)
Physical Therapy Evaluation Patient Details Name: Melissa Snyder MRN: [id number] DOB: 08/20/1933 Today's Date: 07/05/2017   History of Present Illness  84 y.o. female s/p ARTHROPLASTY BIPOLAR HIP (HEMIARTHROPLASTY) (Left). PMH includes: Adenocarcinoma, breast CA, blind left eye, HTN, GERD, Meningioma, radiation therapy, stroke, HTN.     Clinical Impression  Patient is s/p above surgery resulting in functional limitations due to the deficits listed below (see PT Problem List). PTA, patient independent with all mobility, driving, going to [facility] for exercise classes. Pt lives with husband with stairs to enter home. Upon eval, patient presents with post op pain and weakness that limit her mobility. Currently Min A for all mobility. Recommending sub acute rehab before safe return home given patients limitations. Next PT visit wll progress ambulation and reinforce hip precautions.  Patient will benefit from skilled PT to increase their independence and safety with mobility to allow discharge to the venue listed below.       Follow Up Recommendations SNF;Supervision for mobility/OOB    Equipment Recommendations  Rolling walker with 5" wheels;3in1 (PT)    Recommendations for Other Services       Precautions / Restrictions Precautions Precautions: Posterior Hip;Fall Precaution Booklet Issued: Yes (comment) Precaution Comments: reveiwed 3/3 hip precautions Restrictions Weight Bearing Restrictions: Yes LLE Weight Bearing: Weight bearing as tolerated      Mobility  Bed Mobility Overal bed mobility: Needs Assistance Bed Mobility: Supine to Sit     Supine to sit: Min assist     General bed mobility comments: Min A to assist LLE out of bed.   Transfers Overall transfer level: Needs assistance Equipment used: Rolling walker (2 wheeled) Transfers: Sit to/from [facility] Sit to Stand: Min assist Stand pivot transfers: Min assist       General transfer comment: cues for  hand placement and hip precautions, min A for safety and power up into RW.   Ambulation/Gait                Stairs            Wheelchair Mobility    Modified Rankin (Stroke Patients Only)       Balance Overall balance assessment: Needs assistance Sitting-balance support: Feet unsupported;No upper extremity supported Sitting balance-Leahy Scale: Good     Standing balance support: During functional activity;Bilateral upper extremity supported Standing balance-Leahy Scale: Fair                               Pertinent Vitals/Pain Pain Assessment: 0-10 Pain Score: 5  Pain Location: L hip Pain Descriptors / Indicators: Discomfort;Grimacing;Operative site guarding Pain Intervention(s): Limited activity within patient's tolerance;Monitored during session;Premedicated before session;Repositioned    Home Living Family/patient expects to be discharged to:: Skilled nursing facility                 Additional Comments: lives with husband, stairs to come in. indpendent with ADLs and driving.     Prior Function Level of Independence: Independent               Hand Dominance        Extremity/Trunk Assessment   Upper Extremity Assessment Upper Extremity Assessment: Overall WFL for tasks assessed    Lower Extremity Assessment Lower Extremity Assessment: (LLE 3/5 post op pain, RLE 4/5 strength)    Cervical / Trunk Assessment Cervical / Trunk Assessment: Normal  Communication   Communication: No difficulties  Cognition Arousal/Alertness: Awake/alert Behavior  During Therapy: WFL for tasks assessed/performed Overall Cognitive Status: Within Functional Limits for tasks assessed                                        General Comments      Exercises Total Joint Exercises Ankle Circles/Pumps: 20 reps Heel Slides: 10 reps Hip ABduction/ADduction: 10 reps   Assessment/Plan    PT Assessment Patient needs continued PT  services  PT Problem List Decreased strength;Decreased range of motion;Decreased activity tolerance;Decreased balance;Decreased mobility;Pain       PT Treatment Interventions DME instruction;Gait training;Stair training;Functional mobility training;Therapeutic activities;Therapeutic exercise    PT Goals (Current goals can be found in the Care Plan section)  Acute Rehab PT Goals Patient Stated Goal: go to rehab then home PT Goal Formulation: With patient Time For Goal Achievement: 07/12/17 Potential to Achieve Goals: Good    Frequency Min 3X/week   Barriers to discharge        Co-evaluation               AM-PAC PT "6 Clicks" Daily Activity  Outcome Measure Difficulty turning over in bed (including adjusting bedclothes, sheets and blankets)?: Unable Difficulty moving from lying on back to sitting on the side of the bed? : Unable Difficulty sitting down on and standing up from a chair with arms (e.g., wheelchair, bedside commode, etc,.)?: Unable Help needed moving to and from a bed to chair (including a wheelchair)?: A Little Help needed walking in hospital room?: A Lot Help needed climbing 3-5 steps with a railing? : Total 6 Click Score: 9    End of Session Equipment Utilized During Treatment: Gait belt Activity Tolerance: Patient tolerated treatment well Patient left: in chair;with call bell/phone within reach Nurse Communication: Mobility status PT Visit Diagnosis: Unsteadiness on feet (R26.81);Other abnormalities of gait and mobility (R26.89);Muscle weakness (generalized) (M62.81);Pain;History of falling (Z91.81) Pain - Right/Left: Left Pain - part of body: Hip    Time: 1916-6060 PT Time Calculation (min) (ACUTE ONLY): 28 min   Charges:   PT Evaluation $PT Eval Low Complexity: 1 Low PT Treatments $Therapeutic Activity: 8-22 mins   PT G Codes:        Reinaldo Berber, PT, DPT Acute Rehab Services Pager: [phone]    Reinaldo Berber 07/05/2017, 2:04  PM

## 2017-07-05 NOTE — Care Management Important Message (Signed)
Important Message  Patient Details  Name: Melissa Snyder MRN: [id number] Date of Birth: 22-Feb-1934   Medicare Important Message Given:  Yes    Carles Collet, RN 07/05/2017, 11:43 AM

## 2017-07-05 NOTE — Discharge Instructions (Signed)
Discharge Instructions after Hip Hemiarthroplasty   You can bear weight and ambulate as tolerated on both legs Observe posterior hip precautions Use ice on the hip intermittently over the first 48 hours after surgery.  Pain medicine has been prescribed for you.  Use your medicine liberally over the first 48 hours, and then you can begin to taper your use. You may take Extra Strength Tylenol or Tylenol only in place of the pain pills. DO NOT take ANY nonsteroidal anti-inflammatory pain medications: Advil, Motrin, Ibuprofen, Aleve, Naproxen or Naprosyn.  Take aspirin or anticoagulant medication as prescribed for 2 weeks after surgery. Please notify if allergic or sensitivity to aspirin. The dressing is waterproof,okay to shower with it and leave it on until your first visit w Dr.Chandler     Please call [phone] during normal business hours or [phone] after hours for any problems. Including the following:  - excessive redness of the incisions - drainage for more than 4 days - fever of more than 101.5 F  *Please note that pain medications will not be refilled after hours or on weekends.

## 2017-07-05 NOTE — NC FL2 (Signed)
[place] MEDICAID FL2 LEVEL OF CARE SCREENING TOOL     IDENTIFICATION  Patient Name: Melissa Snyder Birthdate: Jan 25, 1934 Sex: female Admission Date (Current Location): 07/03/2017  [facility] [place] and [place] Number:  Herbalist and Address:  [facility] [place] [address] [place], [place] [address]      Provider Number: [id number]  Attending Physician Name and Address:  Elmarie Shiley, MD  Relative Name and Phone Number:  [name], spouse, [phone]    Current Level of Care: Hospital Recommended Level of Care: [place] Prior Approval Number:    Date Approved/Denied:   PASRR Number: [id number]  Discharge Plan: SNF    Current Diagnoses: Patient Active Problem List   Diagnosis Date Noted  . Closed fracture of acetabulum with dislocation of left hip ([place]) 07/03/2017  . S/p left hip fracture 07/03/2017  . Primary cancer of left lower lobe of lung ([place]) 11/25/2014  . Genetic testing 07/01/2014  . Family history of breast cancer   . Family history of bladder cancer   . Family history of prostate cancer   . Breast cancer of upper-outer quadrant of right female breast ([place]) 05/04/2014  . Hypothyroid   . Hypertension   . Arthritis   . Hyperlipidemia 01/12/2014  . Melanoma in situ of face ([place]) 11/02/2013  . Central retinal artery occlusion of left eye 10/24/2013  . HTN (hypertension) 10/24/2013  . Hypothyroidism 10/24/2013  . GERD (gastroesophageal reflux disease) 10/24/2013    Orientation RESPIRATION BLADDER Height & Weight     Self, Time, Place, Situation  Normal Continent Weight:   Height:     BEHAVIORAL SYMPTOMS/MOOD NEUROLOGICAL BOWEL NUTRITION STATUS      Continent Diet(See DC Summary)  AMBULATORY STATUS COMMUNICATION OF NEEDS Skin   Extensive Assist Verbally Surgical wounds                       Personal Care Assistance Level of Assistance  Bathing, Feeding, Dressing Bathing Assistance: Maximum  assistance Feeding assistance: Limited assistance Dressing Assistance: Maximum assistance     Functional Limitations Info  Sight, Hearing, Speech Sight Info: Impaired(left eye) Hearing Info: Impaired Speech Info: Adequate    SPECIAL CARE FACTORS FREQUENCY  PT (By licensed PT), OT (By licensed OT)     PT Frequency: 5x week OT Frequency: 5x week            Contractures      Additional Factors Info  Code Status, Allergies Code Status Info: Full Allergies Info: BEEF-DERIVED PRODUCTS, OTHER, CHOCOLATE, HYDROCODONE, IBUPROFEN, NIACIN AND RELATED, TRAMADOL            Current Medications (07/05/2017):  This is the current hospital active medication list Current Facility-Administered Medications  Medication Dose Route Frequency Provider Last Rate Last Dose  . 0.9 %  sodium chloride infusion   Intravenous Continuous Reyne Dumas, MD 50 mL/hr at 07/03/17 2058    . 0.9 %  sodium chloride infusion   Intravenous Continuous Grier Mitts, PA-C 100 mL/hr at 07/04/17 1713    . acetaminophen (TYLENOL) tablet 650 mg  650 mg Oral Q6H PRN Grier Mitts, PA-C   650 mg at 07/05/17 8502   Or  . acetaminophen (TYLENOL) suppository 650 mg  650 mg Rectal Q6H PRN Grier Mitts, PA-C      . aspirin EC tablet 325 mg  325 mg Oral BID Grier Mitts, PA-C   325 mg at 07/05/17 0909  . atenolol (TENORMIN) tablet  50 mg  50 mg Oral QHS Reyne Dumas, MD   50 mg at 07/04/17 2008  . cholecalciferol (VITAMIN D) tablet 2,000 Units  2,000 Units Oral Daily Reyne Dumas, MD   2,000 Units at 07/05/17 0910  . docusate sodium (COLACE) capsule 100 mg  100 mg Oral BID Reyne Dumas, MD   100 mg at 07/05/17 0909  . hydrALAZINE (APRESOLINE) injection 5 mg  5 mg Intravenous Q6H PRN Reyne Dumas, MD   5 mg at 07/05/17 0608  . HYDROcodone-acetaminophen (NORCO/VICODIN) 5-325 MG per tablet 1-2 tablet  1-2 tablet Oral Q6H PRN Grier Mitts, PA-C   2 tablet at 07/05/17 1057  . lactated ringers  infusion   Intravenous Continuous Duane Boston, MD   Stopped at 07/04/17 1729  . levothyroxine (SYNTHROID, LEVOTHROID) tablet 112 mcg  112 mcg Oral QAC breakfast Reyne Dumas, MD   112 mcg at 07/05/17 0749  . lisinopril (PRINIVIL,ZESTRIL) tablet 10 mg  10 mg Oral Daily Reyne Dumas, MD   10 mg at 07/05/17 0909  . menthol-cetylpyridinium (CEPACOL) lozenge 3 mg  1 lozenge Oral PRN Grier Mitts, PA-C       Or  . phenol (CHLORASEPTIC) mouth spray 1 spray  1 spray Mouth/Throat PRN Grier Mitts, PA-C      . metoCLOPramide (REGLAN) tablet 5-10 mg  5-10 mg Oral Q8H PRN Grier Mitts, PA-C       Or  . metoCLOPramide (REGLAN) injection 5-10 mg  5-10 mg Intravenous Q8H PRN Grier Mitts, PA-C      . morphine 2 MG/ML injection 0.5 mg  0.5 mg Intravenous Q2H PRN Laliberte, Danielle, PA-C      . ondansetron (ZOFRAN) tablet 4 mg  4 mg Oral Q6H PRN Grier Mitts, PA-C       Or  . ondansetron (ZOFRAN) injection 4 mg  4 mg Intravenous Q6H PRN Laliberte, Danielle, PA-C      . pantoprazole (PROTONIX) EC tablet 40 mg  40 mg Oral Daily Regalado, Belkys A, MD   40 mg at 07/05/17 0910  . senna (SENOKOT) tablet 8.6 mg  1 tablet Oral BID Reyne Dumas, MD   8.6 mg at 07/05/17 0909     Discharge Medications: Please see discharge summary for a list of discharge medications.  Relevant Imaging Results:  Relevant Lab Results:   Additional Information SS#: [id number],  possible treatment at [facility] cancer center in [name], LCSW

## 2017-07-06 DIAGNOSIS — Z510 Encounter for antineoplastic radiation therapy: Secondary | ICD-10-CM | POA: Diagnosis present

## 2017-07-06 DIAGNOSIS — M6281 Muscle weakness (generalized): Secondary | ICD-10-CM | POA: Diagnosis not present

## 2017-07-06 DIAGNOSIS — Z9889 Other specified postprocedural states: Secondary | ICD-10-CM | POA: Diagnosis not present

## 2017-07-06 DIAGNOSIS — Z471 Aftercare following joint replacement surgery: Secondary | ICD-10-CM | POA: Diagnosis not present

## 2017-07-06 DIAGNOSIS — M810 Age-related osteoporosis without current pathological fracture: Secondary | ICD-10-CM | POA: Diagnosis not present

## 2017-07-06 DIAGNOSIS — M25552 Pain in left hip: Secondary | ICD-10-CM | POA: Diagnosis not present

## 2017-07-06 DIAGNOSIS — D320 Benign neoplasm of cerebral meninges: Secondary | ICD-10-CM | POA: Diagnosis not present

## 2017-07-06 DIAGNOSIS — S72402D Unspecified fracture of lower end of left femur, subsequent encounter for closed fracture with routine healing: Secondary | ICD-10-CM | POA: Diagnosis not present

## 2017-07-06 DIAGNOSIS — C3432 Malignant neoplasm of lower lobe, left bronchus or lung: Secondary | ICD-10-CM | POA: Diagnosis not present

## 2017-07-06 DIAGNOSIS — R918 Other nonspecific abnormal finding of lung field: Secondary | ICD-10-CM | POA: Diagnosis not present

## 2017-07-06 DIAGNOSIS — T402X5A Adverse effect of other opioids, initial encounter: Secondary | ICD-10-CM | POA: Diagnosis not present

## 2017-07-06 DIAGNOSIS — K5903 Drug induced constipation: Secondary | ICD-10-CM | POA: Diagnosis not present

## 2017-07-06 DIAGNOSIS — E785 Hyperlipidemia, unspecified: Secondary | ICD-10-CM | POA: Diagnosis not present

## 2017-07-06 DIAGNOSIS — E039 Hypothyroidism, unspecified: Secondary | ICD-10-CM | POA: Diagnosis not present

## 2017-07-06 DIAGNOSIS — Z85820 Personal history of malignant melanoma of skin: Secondary | ICD-10-CM | POA: Diagnosis not present

## 2017-07-06 DIAGNOSIS — S72009D Fracture of unspecified part of neck of unspecified femur, subsequent encounter for closed fracture with routine healing: Secondary | ICD-10-CM | POA: Diagnosis not present

## 2017-07-06 DIAGNOSIS — R2689 Other abnormalities of gait and mobility: Secondary | ICD-10-CM | POA: Diagnosis not present

## 2017-07-06 DIAGNOSIS — Z8673 Personal history of transient ischemic attack (TIA), and cerebral infarction without residual deficits: Secondary | ICD-10-CM | POA: Diagnosis not present

## 2017-07-06 DIAGNOSIS — S92153A Displaced avulsion fracture (chip fracture) of unspecified talus, initial encounter for closed fracture: Secondary | ICD-10-CM | POA: Diagnosis not present

## 2017-07-06 DIAGNOSIS — Z853 Personal history of malignant neoplasm of breast: Secondary | ICD-10-CM | POA: Diagnosis not present

## 2017-07-06 DIAGNOSIS — G8911 Acute pain due to trauma: Secondary | ICD-10-CM | POA: Diagnosis not present

## 2017-07-06 DIAGNOSIS — E034 Atrophy of thyroid (acquired): Secondary | ICD-10-CM | POA: Diagnosis not present

## 2017-07-06 DIAGNOSIS — K219 Gastro-esophageal reflux disease without esophagitis: Secondary | ICD-10-CM | POA: Diagnosis not present

## 2017-07-06 DIAGNOSIS — M1990 Unspecified osteoarthritis, unspecified site: Secondary | ICD-10-CM | POA: Diagnosis not present

## 2017-07-06 DIAGNOSIS — D0220 Carcinoma in situ of unspecified bronchus and lung: Secondary | ICD-10-CM | POA: Diagnosis not present

## 2017-07-06 DIAGNOSIS — S72002D Fracture of unspecified part of neck of left femur, subsequent encounter for closed fracture with routine healing: Secondary | ICD-10-CM | POA: Diagnosis not present

## 2017-07-06 DIAGNOSIS — S32402A Unspecified fracture of left acetabulum, initial encounter for closed fracture: Secondary | ICD-10-CM | POA: Diagnosis not present

## 2017-07-06 DIAGNOSIS — Z923 Personal history of irradiation: Secondary | ICD-10-CM | POA: Diagnosis not present

## 2017-07-06 DIAGNOSIS — C3492 Malignant neoplasm of unspecified part of left bronchus or lung: Secondary | ICD-10-CM | POA: Diagnosis not present

## 2017-07-06 DIAGNOSIS — S32402D Unspecified fracture of left acetabulum, subsequent encounter for fracture with routine healing: Secondary | ICD-10-CM | POA: Diagnosis not present

## 2017-07-06 DIAGNOSIS — I10 Essential (primary) hypertension: Secondary | ICD-10-CM | POA: Diagnosis not present

## 2017-07-06 DIAGNOSIS — D62 Acute posthemorrhagic anemia: Secondary | ICD-10-CM | POA: Diagnosis not present

## 2017-07-06 DIAGNOSIS — Z8042 Family history of malignant neoplasm of prostate: Secondary | ICD-10-CM | POA: Diagnosis not present

## 2017-07-06 DIAGNOSIS — Z9181 History of falling: Secondary | ICD-10-CM | POA: Diagnosis not present

## 2017-07-06 DIAGNOSIS — Z8052 Family history of malignant neoplasm of bladder: Secondary | ICD-10-CM | POA: Diagnosis not present

## 2017-07-06 DIAGNOSIS — Z803 Family history of malignant neoplasm of breast: Secondary | ICD-10-CM | POA: Diagnosis not present

## 2017-07-06 LAB — CBC
HCT: 31.4 % — ABNORMAL LOW (ref 36.0–46.0)
HEMOGLOBIN: 10.4 g/dL — AB (ref 12.0–15.0)
MCH: 28.8 pg (ref 26.0–34.0)
MCHC: 33.1 g/dL (ref 30.0–36.0)
MCV: 87 fL (ref 78.0–100.0)
Platelets: 137 10*3/uL — ABNORMAL LOW (ref 150–400)
RBC: 3.61 MIL/uL — AB (ref 3.87–5.11)
RDW: 14.2 % (ref 11.5–15.5)
WBC: 7.3 10*3/uL (ref 4.0–10.5)

## 2017-07-06 LAB — BASIC METABOLIC PANEL
Anion gap: 10 (ref 5–15)
BUN: 8 mg/dL (ref 6–20)
CHLORIDE: 103 mmol/L (ref 101–111)
CO2: 23 mmol/L (ref 22–32)
Calcium: 7.9 mg/dL — ABNORMAL LOW (ref 8.9–10.3)
Creatinine, Ser: 0.55 mg/dL (ref 0.44–1.00)
GFR calc Af Amer: >60 mL/min (ref >60)
GFR calc non Af Amer: >60 mL/min (ref >60)
GLUCOSE: 99 mg/dL (ref 65–99)
POTASSIUM: 3.7 mmol/L (ref 3.5–5.1)
Sodium: 136 mmol/L (ref 135–145)

## 2017-07-06 MED ORDER — SENNA 8.6 MG PO TABS: 1.0000 | ORAL_TABLET | Freq: Two times a day (BID) | ORAL | 0 refills | Status: DC

## 2017-07-06 MED ORDER — ENOXAPARIN SODIUM 40 MG/0.4ML ~~LOC~~ SOLN: 40.0000 mg | SUBCUTANEOUS | 0 refills | Status: DC

## 2017-07-06 MED ORDER — PANTOPRAZOLE SODIUM 40 MG PO TBEC: 40.0000 mg | DELAYED_RELEASE_TABLET | Freq: Every day | ORAL | 0 refills | Status: DC

## 2017-07-06 MED ORDER — ASPIRIN EC 325 MG PO TBEC
325.0000 mg | DELAYED_RELEASE_TABLET | Freq: Two times a day (BID) | ORAL | Status: DC
  Administered 2017-07-06: 325 mg via ORAL
  Filled 2017-07-06: qty 1

## 2017-07-06 NOTE — Progress Notes (Signed)
Patient will DC [id number] Anticipated DC date: 07/06/17 Family notified: Family at bedside Transport by: Corey Harold   Per MD patient ready for DC to [place]. RN, patient, patient's family, and facility notified of DC. Discharge Summary sent to facility. RN given number for report [phone]). DC packet on chart. Ambulance transport requested for patient.   CSW signing off.  Cedric Fishman, [place] Social Worker [phone]

## 2017-07-06 NOTE — Evaluation (Signed)
Occupational Therapy Evaluation Patient Details Name: Melissa Snyder MRN: [id number] DOB: 08-May-1934 Today's Date: 07/06/2017    History of Present Illness 84 y.o. female s/p ARTHROPLASTY BIPOLAR HIP (HEMIARTHROPLASTY) (Left). PMH includes: Adenocarcinoma, breast CA, blind left eye, HTN, GERD, Meningioma, radiation therapy, stroke, HTN.    Clinical Impression   PTA Pt independent in ADL and mobility - does silver sneakers at [facility], drives etc. Pt was able to recall 2/3 posterior hip precautions at the beginning of session; handout and precautions reviewed in full. Pt able to perform bed mobility and recliner transfer at min A level with min vc for bending past 90 when bringing hips to EOB for transfer. Pt currently max A for LB ADL and eager to learn about AE for LB ADL (initiated education verbally this session). Pt will benefit from skilled OT in the acute setting and afterwards at the SNF level to maximize safety and independence in ADL and functional transfers. Next session, take AE kit and start practice with tools.    Follow Up Recommendations  SNF;Supervision/Assistance - 24 hour    Equipment Recommendations  Other (comment);3 in 1 bedside commode(AE kit for LB ADL)    Recommendations for Other Services       Precautions / Restrictions Precautions Precautions: Posterior Hip;Fall Precaution Booklet Issued: Yes (comment) Precaution Comments: Handout provided and reviewed in full Restrictions Weight Bearing Restrictions: Yes LLE Weight Bearing: Weight bearing as tolerated      Mobility Bed Mobility Overal bed mobility: Needs Assistance Bed Mobility: Supine to Sit     Supine to sit: Min assist     General bed mobility comments: Min A to assist LLE out of bed.   Transfers Overall transfer level: Needs assistance Equipment used: Rolling walker (2 wheeled) Transfers: Sit to/from [facility] Sit to Stand: Min assist Stand pivot transfers: Min assist        General transfer comment: cues for hand placement and hip precautions, min A for safety and power up into RW.     Balance Overall balance assessment: Needs assistance Sitting-balance support: Feet unsupported;No upper extremity supported Sitting balance-Leahy Scale: Good     Standing balance support: During functional activity;Bilateral upper extremity supported Standing balance-Leahy Scale: Fair Standing balance comment: reliant on RW for dynamic movement - able to static stand briefly without UE support                           ADL either performed or assessed with clinical judgement   ADL Overall ADL's : Needs assistance/impaired Eating/Feeding: Modified independent;Sitting Eating/Feeding Details (indicate cue type and reason): eating lunch at end of session Grooming: Set up;Sitting   Upper Body Bathing: Set up;Sitting   Lower Body Bathing: Maximal assistance   Upper Body Dressing : Set up   Lower Body Dressing: Maximal assistance;Sit to/from stand Lower Body Dressing Details (indicate cue type and reason): initiated verbally use of AE for LB ADL Toilet Transfer: Minimal assistance;Stand-pivot;BSC;RW Toilet Transfer Details (indicate cue type and reason): min A for boost up initially Toileting- Clothing Manipulation and Hygiene: Maximal assistance       Functional mobility during ADLs: Min guard;Minimal assistance;Rolling walker General ADL Comments: Educated on posterior hip precautions and impact on LB ADL - initiated AE education verbally - plan for demo next session     Vision Baseline Vision/History: Wears glasses Wears Glasses: At all times Patient Visual Report: No change from baseline       Perception  Praxis      Pertinent Vitals/Pain Pain Assessment: Faces Faces Pain Scale: Hurts little more Pain Location: L hip Pain Descriptors / Indicators: Discomfort;Grimacing;Operative site guarding Pain Intervention(s): Monitored during  session;Repositioned     Hand Dominance Left   Extremity/Trunk Assessment Upper Extremity Assessment Upper Extremity Assessment: Overall WFL for tasks assessed   Lower Extremity Assessment Lower Extremity Assessment: LLE deficits/detail LLE Deficits / Details: s/p sx LLE Coordination: decreased gross motor   Cervical / Trunk Assessment Cervical / Trunk Assessment: Normal   Communication Communication Communication: No difficulties   Cognition Arousal/Alertness: Awake/alert Behavior During Therapy: WFL for tasks assessed/performed Overall Cognitive Status: Within Functional Limits for tasks assessed                                     General Comments  Pt's family present during session for transfer    Exercises     Shoulder Instructions      Home Living Family/patient expects to be discharged to:: Skilled nursing facility Living Arrangements: Spouse/significant other                               Additional Comments: lives with husband, stairs to come in. indpendent with ADLs and driving.       Prior Functioning/Environment Level of Independence: Independent        Comments: Pt drives and works out with silver sneakers at [employer]        OT Problem List: Decreased strength;Decreased range of motion;Decreased activity tolerance;Impaired balance (sitting and/or standing);Decreased knowledge of use of DME or AE;Decreased knowledge of precautions;Pain      OT Treatment/Interventions: Self-care/ADL training;Energy conservation;DME and/or AE instruction;Manual therapy;Therapeutic activities;Patient/family education;Balance training    OT Goals(Current goals can be found in the care plan section) Acute Rehab OT Goals Patient Stated Goal: go to rehab then home OT Goal Formulation: With patient/family Time For Goal Achievement: 07/20/17 Potential to Achieve Goals: Good ADL Goals Pt Will Perform Grooming: with supervision;standing Pt Will  Perform Lower Body Bathing: with supervision;sit to/from stand;with caregiver independent in assisting;with adaptive equipment Pt Will Perform Lower Body Dressing: with supervision;with caregiver independent in assisting;with adaptive equipment;sit to/from stand Pt Will Transfer to Toilet: with supervision;ambulating Pt Will Perform Toileting - Clothing Manipulation and hygiene: with supervision;sit to/from stand Additional ADL Goal #1: Pt will recall 3/3 posterior hip precautions with no verbal cues and maintain throughout session  OT Frequency: Min 2X/week   Barriers to D/C:            Co-evaluation              AM-PAC PT "6 Clicks" Daily Activity     Outcome Measure Help from another person eating meals?: None Help from another person taking care of personal grooming?: A Little Help from another person toileting, which includes using toliet, bedpan, or urinal?: A Little Help from another person bathing (including washing, rinsing, drying)?: A Lot Help from another person to put on and taking off regular upper body clothing?: A Little Help from another person to put on and taking off regular lower body clothing?: A Lot 6 Click Score: 17   End of Session Equipment Utilized During Treatment: Gait belt;Rolling walker Nurse Communication: Mobility status(leave out purewick and use BSC/toilet)  Activity Tolerance: Patient tolerated treatment well Patient left: in chair;with call bell/phone within reach;with family/visitor present  OT Visit Diagnosis: Unsteadiness on feet (R26.81);Other abnormalities of gait and mobility (R26.89);History of falling (Z91.81);Pain Pain - Right/Left: Left Pain - part of body: Hip                Time: 1130-1200 OT Time Calculation (min): 30 min Charges:  OT General Charges $OT Visit: 1 Visit OT Evaluation $OT Eval Moderate Complexity: 1 Mod OT Treatments $Self Care/Home Management : 8-22 mins G-Codes:     Hulda Humphrey  OTR/L [place] 07/06/2017, 12:15 PM

## 2017-07-06 NOTE — Clinical Social Work Placement (Signed)
   CLINICAL SOCIAL WORK PLACEMENT  NOTE  Date:  07/06/2017  Patient Details  [name] MRN: [id number] Date of Birth: 05/26/1934  Clinical Social Work is seeking post-discharge placement for this patient at the [place] level of care (*CSW will initial, date and re-position this form in  chart as items are completed):  Yes   Patient/family provided with [place] Work Department's list of facilities offering this level of care within the geographic area requested by the patient (or if unable, by the patient's family).  Yes   Patient/family informed of their freedom to choose among providers that offer the needed level of care, that participate in Medicare, Medicaid or managed care program needed by the patient, have an available bed and are willing to accept the patient.  Yes   Patient/family informed of [place]'s ownership interest in [facility] and [facility], as well as of the fact that they are under no obligation to receive care at these facilities.  PASRR submitted to EDS on       PASRR number received on 07/05/17     Existing PASRR number confirmed on       FL2 transmitted to all facilities in geographic area requested by pt/family on 07/05/17     FL2 transmitted to all facilities within larger geographic area on       Patient informed that his/her managed care company has contracts with or will negotiate with certain facilities, including the following:        Yes   Patient/family informed of bed offers received.  Patient chooses bed at [facility]     Physician recommends and patient chooses bed at      Patient to be transferred to [facility] on 07/06/17.  Patient to be transferred to facility by [facility]AR     Patient family notified on 07/06/17 of transfer.  Snyder of family member notified:  Family at bedside     PHYSICIAN       Additional Comment:     _______________________________________________ Benard Halsted, [place] 07/06/2017, 1:30 PM

## 2017-07-06 NOTE — Progress Notes (Addendum)
Subjective: 2 Days Post-Op Procedure(s) (LRB): ARTHROPLASTY BIPOLAR HIP (HEMIARTHROPLASTY) (Left) Patient reports pain as mild.    Objective: Vital signs in last 24 hours: Temp:  [98.8 F (37.1 C)-99.7 F (37.6 C)] 99.6 F (37.6 C) (02/02 0531) Pulse Rate:  [73-78] 78 (02/02 0531) Resp:  [17-20] 18 (02/02 0531) BP: (147-154)/(56-74) 154/56 (02/02 0531) SpO2:  [98 %-99 %] 99 % (02/02 0531) Weight:  [61.2 kg (135 lb)] 61.2 kg (135 lb) (02/02 0030)  Intake/Output from previous day: 02/01 0701 - 02/02 0700 In: 1130 [P.O.:1130] Out: 1100 [Urine:1100] Intake/Output this shift: No intake/output data recorded.  Recent Labs    07/03/17 1214 07/05/17 0538 07/06/17 0418  HGB 12.3 10.9* 10.4*   Recent Labs    07/05/17 0538 07/06/17 0418  WBC 6.2 7.3  RBC 3.85* 3.61*  HCT 33.5* 31.4*  PLT 137* 137*   Recent Labs    07/05/17 0538 07/06/17 0418  NA 139 136  K 3.7 3.7  CL 107 103  CO2 25 23  BUN 7 8  CREATININE 0.62 0.55  GLUCOSE 110* 99  CALCIUM 8.0* 7.9*   Recent Labs    07/03/17 1214  INR 0.96  Left hip exam:  Neurovascular intact Sensation intact distally Intact pulses distally Dorsiflexion/Plantar flexion intact Incision: dressing C/D/I Compartment soft  Assessment/Plan: 2 Days Post-Op Procedure(s) (LRB): ARTHROPLASTY BIPOLAR HIP (HEMIARTHROPLASTY) (Left) Plan: Aspirin and SCDs for DVT prophylaxis.  Per Dr. Tamera Punt. Up with therapy Discharge to SNF when medically stable. Weight-bear as tolerated on left with posterior hip precautions.  [place] G 07/06/2017, 8:48 AM

## 2017-07-06 NOTE — Progress Notes (Signed)
Patient's family in room chose [facility]. Facility able to accept log. Placed in [place] AD's box.  Percell Locus Pelagia Iacobucci LCSW [phone]

## 2017-07-06 NOTE — Clinical Social Work Note (Signed)
CSW provided pt with bed offers that will take a LOG due to lack of insurance auth. Pt to call CSW will decision before 4:00.   [place], [place]

## 2017-07-06 NOTE — Progress Notes (Signed)
Physical Therapy Treatment Patient Details Name: Melissa Snyder MRN: [id number] DOB: Sep 20, 1933 Today's Date: 07/06/2017    History of Present Illness 84 y.o. female s/p ARTHROPLASTY BIPOLAR HIP (HEMIARTHROPLASTY) (Left). PMH includes: Adenocarcinoma, breast CA, blind left eye, HTN, GERD, Meningioma, radiation therapy, stroke, HTN.     PT Comments    Patient progressing well with therapy. Improved independence with transfers and able to ambulate short distances in hospital room today with min guard and RW. Patient d/c to SNF today, will do fantastic there. Reviewed hip precautions pt able to recall 3/3 and follow during dynamic mobility.     Follow Up Recommendations  SNF;Supervision for mobility/OOB     Equipment Recommendations  Rolling walker with 5" wheels;3in1 (PT)    Recommendations for Other Services       Precautions / Restrictions Precautions Precautions: Posterior Hip;Fall Precaution Booklet Issued: Yes (comment) Precaution Comments: Handout provided and reviewed in full Restrictions Weight Bearing Restrictions: Yes LLE Weight Bearing: Weight bearing as tolerated    Mobility  Bed Mobility Overal bed mobility: Needs Assistance Bed Mobility: Supine to Sit     Supine to sit: Min assist     General bed mobility comments: OOB at entry  Transfers Overall transfer level: Needs assistance Equipment used: Rolling walker (2 wheeled) Transfers: Sit to/from [facility] Sit to Stand: Min guard Stand pivot transfers: Min guard       General transfer comment: cues for hand placement and hip precautions, min A for safety and power up into RW.   Ambulation/Gait Ambulation/Gait assistance: Min guard Ambulation Distance (Feet): 25 Feet Assistive device: Rolling walker (2 wheeled) Gait Pattern/deviations: Step-to pattern;Antalgic     General Gait Details: cues for sequencing and step length. min guard for safety in RW   Stairs             Wheelchair Mobility    Modified Rankin (Stroke Patients Only)       Balance Overall balance assessment: Needs assistance Sitting-balance support: Feet unsupported;No upper extremity supported Sitting balance-Leahy Scale: Good     Standing balance support: During functional activity;Bilateral upper extremity supported Standing balance-Leahy Scale: Fair Standing balance comment: reliant on RW for dynamic movement - able to static stand briefly without UE support                            Cognition Arousal/Alertness: Awake/alert Behavior During Therapy: WFL for tasks assessed/performed Overall Cognitive Status: Within Functional Limits for tasks assessed                                        Exercises      General Comments General comments (skin integrity, edema, etc.): pts family present during session      Pertinent Vitals/Pain Pain Assessment: Faces Faces Pain Scale: Hurts little more Pain Location: L hip Pain Descriptors / Indicators: Discomfort;Grimacing;Operative site guarding Pain Intervention(s): Limited activity within patient's tolerance;Monitored during session;Premedicated before session    Home Living Family/patient expects to be discharged to:: Skilled nursing facility Living Arrangements: Spouse/significant other             Additional Comments: lives with husband, stairs to come in. indpendent with ADLs and driving.     Prior Function Level of Independence: Independent      Comments: Pt drives and works out with silver sneakers at [employer]  PT Goals (current goals can now be found in the care plan section) Acute Rehab PT Goals Patient Stated Goal: go to rehab then home PT Goal Formulation: With patient Time For Goal Achievement: 07/12/17 Potential to Achieve Goals: Good Progress towards PT goals: Progressing toward goals    Frequency    Min 3X/week      PT Plan Current plan remains appropriate     Co-evaluation              AM-PAC PT "6 Clicks" Daily Activity  Outcome Measure  Difficulty turning over in bed (including adjusting bedclothes, sheets and blankets)?: Unable Difficulty moving from lying on back to sitting on the side of the bed? : Unable Difficulty sitting down on and standing up from a chair with arms (e.g., wheelchair, bedside commode, etc,.)?: Unable Help needed moving to and from a bed to chair (including a wheelchair)?: A Little Help needed walking in hospital room?: A Little Help needed climbing 3-5 steps with a railing? : A Lot 6 Click Score: 11    End of Session Equipment Utilized During Treatment: Gait belt Activity Tolerance: Patient tolerated treatment well Patient left: in chair;with call bell/phone within reach Nurse Communication: Mobility status PT Visit Diagnosis: Unsteadiness on feet (R26.81);Other abnormalities of gait and mobility (R26.89);Muscle weakness (generalized) (M62.81);Pain;History of falling (Z91.81) Pain - Right/Left: Left Pain - part of body: Hip     Time: 4497-5300 PT Time Calculation (min) (ACUTE ONLY): 22 min  Charges:  $Gait Training: 8-22 mins                    G Codes:       Reinaldo Berber, PT, DPT Acute Rehab Services Pager: [phone]     Reinaldo Berber 07/06/2017, 1:50 PM

## 2017-07-06 NOTE — Discharge Summary (Addendum)
Physician Discharge Summary  Melissa Snyder DTO:[id number] DOB: 07-22-1933 DOA: 07/03/2017  PCP: [name], MD  Admit date: 07/03/2017 Discharge date: 07/06/2017  Admitted From: Home  Disposition:  SNF  Recommendations for Outpatient Follow-up:  1. Follow up with PCP in 1-2 weeks 2. Please obtain BMP/CBC in one week 3. Follow up with Dr Salli Real.   Home Health: yes   Discharge Condition: stable.  CODE STATUS: full code.  Diet recommendation: Heart Healthy   Brief/Interim Summary:  84 year old female with a history of recurrent adenocarcinoma status post segmentectomy of the left lower lobe in 2016 with recurrence, now pending radiation treatments, dyslipidemia, hypertension who presents to the ED today after a fall. Patient was leaving [employer] exercise class when she slipped on a patch of ice. She fell on her left side and sustained an abrasion on her left palm. She denied any palpitations, chest pain shortness of breath loss of consciousness. Patient is fairly active at baseline. She denies any hemoptysis. Recently diagnosed with recurrence of adenocarcinoma and was supposed to start radiation treatments on Tuesday next week by Dr. Lisbeth Renshaw. Patient has been evaluated by Dr. Tamera Punt in the ED was requesting transfer to [facility] for repair.   Assessment & Plan:   Principal Problem:   Closed fracture of acetabulum with dislocation of left hip (HCC) Active Problems:   Central retinal artery occlusion of left eye   HTN (hypertension)   Hypothyroidism   GERD (gastroesophageal reflux disease)   Hypertension   S/p left hip fracture   Closed fracture of acetabulum with dislocation of left hip  Underwent left l hip hemiarthoplasty 1-31. I discussed with Marliss Czar PA for Dr Tamera Punt regarding DVT prophylaxis. Will use Lovenox for DVT prophylaxis. She was going to place the order if Dr Tamera Punt agree. There was a pend order for Lovenox which I continue.  Bowel management;  senokot.  pain controlled.   Acute blood loss anemia;  Post op, expected.  Hb stable.   HTN; continue with lisinopril/ atenolol.  PRM Hydralazine.   Hypothyroidism; continue with synthroid.   GERD; start PPI daily   Recurrent adenocarcinoma, seen by Dr. Kyung Rudd, plan was to start radiation treatments next week  Back pain; tylenol prn. Improved.       Discharge Diagnoses:  Principal Problem:   Closed fracture of acetabulum with dislocation of left hip ([place]) Active Problems:   Central retinal artery occlusion of left eye   HTN (hypertension)   Hypothyroidism   GERD (gastroesophageal reflux disease)   Hypertension   S/p left hip fracture    Discharge Instructions  Discharge Instructions    Diet - low sodium heart healthy   Complete by:  As directed    Diet - low sodium heart healthy   Complete by:  As directed    Increase activity slowly   Complete by:  As directed    Weight bearing as tolerated   Complete by:  As directed      Allergies as of 07/06/2017      Reactions   Beef-derived Products Other (See Comments)   ACID REFLUX   Other    FRIED FOODS UNSPECIFIED REACTION    Chocolate Other (See Comments)   Acid Reflux   Hydrocodone Other (See Comments)   INSOMNIA HYPERACTIVITY   Ibuprofen Rash, Other (See Comments)   Causes mouth sores   Niacin And Related Other (See Comments)   Flushing, burning, redness > REACTIONS NOT ALLERGIC    Tramadol Itching  Medication List    STOP taking these medications   aspirin EC 81 MG tablet   VISION FORMULA PO     TAKE these medications   alendronate 70 MG tablet Commonly known as:  FOSAMAX Take 70 mg by mouth once a week. Pt takes medication before breakfast with full glass of water. Pt sits upright for 30 minutes after taking medicine. Do not  lie down or recline for at least 30 minutes after taking medication ON SUNDAYS   atenolol 50 MG tablet Commonly known as:  TENORMIN Take 50 mg by  mouth at bedtime.   b complex vitamins capsule Take 1 capsule by mouth daily.   calcium-vitamin D 500-200 MG-UNIT tablet Commonly known as:  OSCAL WITH D Take 1 tablet by mouth at bedtime.   E-400 400 UNIT capsule Generic drug:  vitamin E Take 400 Units by mouth every Monday, Wednesday, and Friday. Mon Wed Fri   enoxaparin 40 MG/0.4ML injection Commonly known as:  LOVENOX Inject 0.4 mLs (40 mg total) into the skin daily for 19 days. Start taking on:  07/07/2017   ENZYME DIGEST PO Take 5 mLs by mouth 3 (three) times daily with meals.   fish oil-omega-3 fatty acids 1000 MG capsule Take 1 g by mouth daily.   HYDROcodone-acetaminophen 5-325 MG tablet Commonly known as:  NORCO/VICODIN Take 1-2 tablets by mouth every 6 (six) hours as needed for moderate pain.   levothyroxine 112 MCG tablet Commonly known as:  SYNTHROID, LEVOTHROID Take 112 mcg by mouth daily before breakfast.   lisinopril 10 MG tablet Commonly known as:  PRINIVIL,ZESTRIL Take 1 tablet (10 mg total) by mouth daily. What changed:  when to take this   multivitamin tablet Take 1 tablet by mouth every morning.   NASACORT AQ NA Place 1 spray into the nose at bedtime as needed (CONGESTION).   pantoprazole 40 MG tablet Commonly known as:  PROTONIX Take 1 tablet (40 mg total) by mouth daily. Start taking on:  07/07/2017   REFRESH OP Apply 1 drop to eye daily as needed (DRY EYES).   senna 8.6 MG Tabs tablet Commonly known as:  SENOKOT Take 1 tablet (8.6 mg total) by mouth 2 (two) times daily.   Vitamin D 2000 units tablet Take 2,000 Units by mouth daily.            Discharge Care Instructions  (From admission, onward)        Start     Ordered   07/05/17 0000  Weight bearing as tolerated     02 /01/19 2355     Follow-up Information    Tania Ade, MD. Schedule an appointment as soon as possible for a visit in 2 week(s).   Specialty:  Orthopedic Surgery Contact information: [address] [place] [address] [phone]        Malena Catholic, MD Follow up.   Contact information: [address] [place] [address] [phone]          Allergies  Allergen Reactions  . Beef-Derived Products Other (See Comments)    ACID REFLUX  . Other     FRIED FOODS UNSPECIFIED REACTION   . Chocolate Other (See Comments)    Acid Reflux  . Hydrocodone Other (See Comments)    INSOMNIA HYPERACTIVITY  . Ibuprofen Rash and Other (See Comments)    Causes mouth sores  . Niacin And Related Other (See Comments)    Flushing, burning, redness > REACTIONS NOT ALLERGIC   . Tramadol  Itching    Consultations:  Dr Tamera Punt   Procedures/Studies: Dg Chest 2 View  Result Date: 07/03/2017 CLINICAL DATA:  Fall today, LEFT hip pain. EXAM: CHEST  2 VIEW COMPARISON:  06/18/2017. FINDINGS: Partial exclusion of the RIGHT lung base. Unremarkable cardiomediastinal silhouette. No consolidation or edema. No effusion or pneumothorax. Skeletal osteopenia. IMPRESSION: No active cardiopulmonary disease.Stable chest. Electronically Signed   By: Staci Righter M.D.   On: 07/03/2017 10:45   Dg Chest 2 View  Result Date: 06/18/2017 CLINICAL DATA:  Preoperative chest x-ray prior to video bronchoscopy. EXAM: CHEST  2 VIEW COMPARISON:  CT scan of the chest of June 10, 2017 and chest x-ray of December 14, 2014. FINDINGS: The lungs are mildly hyperinflated. The interstitial markings are coarse though stable. There is no alveolar infiltrate or pleural effusion. There is stable biapical pleural thickening. The heart and pulmonary vascularity are normal. The mediastinum is normal in width. There calcification in the wall of the thoracic aorta. There multilevel degenerative disc disease of the thoracic spine. IMPRESSION: Chronic bronchitic changes, stable. No pneumonia, CHF, nor other acute cardiopulmonary abnormality. Electronically Signed   By: David  Martinique M.D.   On: 06/18/2017 14:13   Ct Chest  Wo Contrast  Result Date: 06/10/2017 CLINICAL DATA:  Status post left lower lobe superior segmentectomy 11/25/2014 for primary bronchogenic adenocarcinoma. Patient presents for follow-up of enlarging bandlike nodularity in the left lower lobe. EXAM: CT CHEST WITHOUT CONTRAST TECHNIQUE: Multidetector CT imaging of the chest was performed following the standard protocol without IV contrast. COMPARISON:  03/04/2017 PET-CT.  02/19/2017 chest CT. FINDINGS: Cardiovascular: Normal heart size. No significant pericardial fluid/thickening. Coronary atherosclerosis. Atherosclerotic nonaneurysmal thoracic aorta. Normal caliber pulmonary arteries. Mediastinum/Nodes: No discrete thyroid nodules. Unremarkable esophagus. No pathologically enlarged axillary, mediastinal or gross hilar lymph nodes, noting limited sensitivity for the detection of hilar adenopathy on this noncontrast study. Lungs/Pleura: No pneumothorax. No pleural effusion. Status post superior segmentectomy in the left lower lobe. There is continued mild growth of the solid bandlike irregular nodularity in the anterior left lower lobe along the surgical sutures, which measures 1.8 x 1.1 cm on series 5/ image 64, mildly increased from 1.7 x 0.9 cm, and which measures 2.3 x 1.6 cm more centrally on series 5/ image 66, mildly increased from 2.0 x 1.2 cm. New mild patchy consolidation in the inferior right middle lobe superimposed on chronic patchy tree-in-bud opacities. New subsolid 5 mm right upper lobe pulmonary nodule (series 5/ image 39). Additional previously described subcentimeter scattered ground-glass pulmonary nodules in the bilateral upper lobes measuring up to 9 mm in the left upper lobe (series 5/image 30) are unchanged. Patchy radiation fibrosis in the anterior mid to upper right lung is stable. Upper abdomen: Unremarkable. Musculoskeletal: No aggressive appearing focal osseous lesions. Marked thoracic spondylosis. IMPRESSION: 1. Continued mild interval  growth of irregular solid bandlike nodularity in the anterior left lower lobe along the segmentectomy suture line. Findings remain worrisome for slowly progressive local tumor recurrence. 2. New patchy consolidation superimposed on chronic tree-in-bud opacities in the inferior right middle lobe, nonspecific, more likely a mild pneumonia. 3. New nonspecific subsolid 5 mm right upper lobe pulmonary nodule. Additional subcentimeter ground-glass bilateral upper lobe pulmonary nodules are stable. Attention recommended on follow-up chest CT in 3-6 months. 4. Coronary atherosclerosis. Aortic Atherosclerosis (ICD10-I70.0). Electronically Signed   By: Ilona Sorrel M.D.   On: 06/10/2017 18:11   Pelvis Portable  Result Date: 07/04/2017 CLINICAL DATA:  The patient suffered a left femoral neck  fracture in a fall 07/03/2016. Status post left hip replacement today. Initial encounter. EXAM: PORTABLE PELVIS 1-2 VIEWS COMPARISON:  Plain films left hip 07/03/2017. FINDINGS: New bipolar left hip hemiarthroplasty is in place. The device is located. No fracture. Gas in the soft tissues and surgical staples noted. No new abnormality. IMPRESSION: Status post left hip replacement.  No acute finding. Electronically Signed   By: Inge Rise M.D.   On: 07/04/2017 12:38   Mm Diag Breast Tomo Bilateral  Result Date: 06/17/2017 CLINICAL DATA:  84 year old female status post right lumpectomy in 2016. EXAM: 2D DIGITAL DIAGNOSTIC BILATERAL MAMMOGRAM WITH CAD AND ADJUNCT TOMO COMPARISON:  Previous exam(s). ACR Breast Density Category b: There are scattered areas of fibroglandular density. FINDINGS: Stable postoperative changes are demonstrated in the upper outer right breast posteriorly. No suspicious masses, calcifications or distortion is identified in either breast. Mammographic images were processed with CAD. IMPRESSION: 1. No mammographic evidence of malignancy in either breast. 2. Stable postoperative changes on the right.  RECOMMENDATION: Diagnostic mammogram is suggested in 1 year. (Code:[id number]-01Y) I have discussed the findings and recommendations with the patient. Results were also provided in writing at the conclusion of the visit. If applicable, a reminder letter will be sent to the patient regarding the next appointment. BI-RADS CATEGORY  2: Benign. Electronically Signed   By: Kristopher Oppenheim M.D.   On: 06/17/2017 12:00   Dg Hip Unilat With Pelvis 2-3 Views Left  Result Date: 07/03/2017 CLINICAL DATA:  84 year old female status post fall today with left hip pain. Breast and lung cancer. EXAM: DG HIP (WITH OR WITHOUT PELVIS) 2-3V LEFT COMPARISON:  PET-CT 03/04/2017 FINDINGS: Left femoral neck fracture with mild impaction and cephalad displacement. The left femoral head remains normally located. The intertrochanteric segment of the left femur appears intact. The pelvis appears intact. The proximal right femur appears grossly intact. Bone mineralization is within normal limits for age. Iliofemoral calcified atherosclerosis. Negative visible bowel gas pattern. IMPRESSION: Acute left femoral neck fracture with varus impaction. Electronically Signed   By: Genevie Ann M.D.   On: 07/03/2017 10:47   Dg C-arm Bronchoscopy  Result Date: 06/19/2017 C-ARM BRONCHOSCOPY: Fluoroscopy was utilized by the requesting physician.  No radiographic interpretation.     Subjective: Feeling better, pain is better   Discharge Exam: Vitals:   07/06/17 0100 07/06/17 0531  BP:  (!) 154/56  Pulse:  78  Resp: 20 18  Temp:  99.6 F (37.6 C)  SpO2:  99%   Vitals:   07/05/17 2300 07/06/17 0030 07/06/17 0100 07/06/17 0531  BP:    (!) 154/56  Pulse:    78  Resp: 20  20 18   Temp:    99.6 F (37.6 C)  TempSrc:    Oral  SpO2:    99%  Weight:  61.2 kg (135 lb)    Height:  5\' 6"  (1.676 m)      General: Pt is alert, awake, not in acute distress Cardiovascular: RRR, S1/S2 +, no rubs, no gallops Respiratory: CTA bilaterally, no wheezing, no  rhonchi Abdominal: Soft, NT, ND, bowel sounds + Extremities: no edema, no cyanosis    The results of significant diagnostics from this hospitalization (including imaging, microbiology, ancillary and laboratory) are listed below for reference.     Microbiology: Recent Results (from the past 240 hour(s))  MRSA PCR Screening     Status: None   Collection Time: 07/03/17  8:16 PM  Result Value Ref Range Status   MRSA by PCR  NEGATIVE NEGATIVE Final    Comment:        The GeneXpert MRSA Assay (FDA approved for NASAL specimens only), is one component of a comprehensive MRSA colonization surveillance program. It is not intended to diagnose MRSA infection nor to guide or monitor treatment for MRSA infections.      Labs: BNP (last 3 results) No results for input(s): BNP in the last 8760 hours. Basic Metabolic Panel: Recent Labs  Lab 07/03/17 1214 07/05/17 0538 07/06/17 0418  NA 140 139 136  K 3.8 3.7 3.7  CL 104 107 103  CO2 29 25 23   GLUCOSE 95 110* 99  BUN 16 7 8   CREATININE 0.56 0.62 0.55  CALCIUM 9.3 8.0* 7.9*  MG 1.9  --   --    Liver Function Tests: No results for input(s): AST, ALT, ALKPHOS, BILITOT, PROT, ALBUMIN in the last 168 hours. No results for input(s): LIPASE, AMYLASE in the last 168 hours. No results for input(s): AMMONIA in the last 168 hours. CBC: Recent Labs  Lab 07/03/17 1214 07/05/17 0538 07/06/17 0418  WBC 8.0 6.2 7.3  NEUTROABS 6.8  --   --   HGB 12.3 10.9* 10.4*  HCT 36.6 33.5* 31.4*  MCV 86.7 87.0 87.0  PLT 157 137* 137*   Cardiac Enzymes: No results for input(s): CKTOTAL, CKMB, CKMBINDEX, TROPONINI in the last 168 hours. BNP: Invalid input(s): POCBNP CBG: No results for input(s): GLUCAP in the last 168 hours. D-Dimer No results for input(s): DDIMER in the last 72 hours. Hgb A1c No results for input(s): HGBA1C in the last 72 hours. Lipid Profile No results for input(s): CHOL, HDL, LDLCALC, TRIG, CHOLHDL, LDLDIRECT in the last  72 hours. Thyroid function studies Recent Labs    07/03/17 1343  TSH 2.051   Anemia work up No results for input(s): VITAMINB12, FOLATE, FERRITIN, TIBC, IRON, RETICCTPCT in the last 72 hours. Urinalysis    Component Value Date/Time   COLORURINE STRAW (A) 07/03/2017 1525   APPEARANCEUR CLEAR 07/03/2017 1525   LABSPEC 1.004 (L) 07/03/2017 1525   PHURINE 8.0 07/03/2017 1525   GLUCOSEU NEGATIVE 07/03/2017 1525   HGBUR NEGATIVE 07/03/2017 1525   BILIRUBINUR NEGATIVE 07/03/2017 1525   KETONESUR NEGATIVE 07/03/2017 1525   PROTEINUR NEGATIVE 07/03/2017 1525   UROBILINOGEN 0.2 12/19/2015 1830   NITRITE NEGATIVE 07/03/2017 1525   LEUKOCYTESUR NEGATIVE 07/03/2017 1525   Sepsis Labs Invalid input(s): PROCALCITONIN,  WBC,  LACTICIDVEN Microbiology Recent Results (from the past 240 hour(s))  MRSA PCR Screening     Status: None   Collection Time: 07/03/17  8:16 PM  Result Value Ref Range Status   MRSA by PCR NEGATIVE NEGATIVE Final    Comment:        The GeneXpert MRSA Assay (FDA approved for NASAL specimens only), is one component of a comprehensive MRSA colonization surveillance program. It is not intended to diagnose MRSA infection nor to guide or monitor treatment for MRSA infections.      Time coordinating discharge: Over 30 minutes  SIGNED:   Elmarie Shiley, MD  [facility] 07/06/2017, 1:00 PM Pager [phone]  If 7PM-7AM, please contact night-coverage [email or web address] Password [email or web address]

## 2017-07-08 ENCOUNTER — Telehealth: Payer: Self-pay | Admitting: Radiation Oncology

## 2017-07-08 DIAGNOSIS — K219 Gastro-esophageal reflux disease without esophagitis: Secondary | ICD-10-CM | POA: Diagnosis not present

## 2017-07-08 DIAGNOSIS — D320 Benign neoplasm of cerebral meninges: Secondary | ICD-10-CM | POA: Diagnosis not present

## 2017-07-08 DIAGNOSIS — Z923 Personal history of irradiation: Secondary | ICD-10-CM | POA: Diagnosis not present

## 2017-07-08 DIAGNOSIS — R918 Other nonspecific abnormal finding of lung field: Secondary | ICD-10-CM | POA: Diagnosis not present

## 2017-07-08 DIAGNOSIS — M1990 Unspecified osteoarthritis, unspecified site: Secondary | ICD-10-CM | POA: Diagnosis not present

## 2017-07-08 DIAGNOSIS — Z510 Encounter for antineoplastic radiation therapy: Secondary | ICD-10-CM | POA: Diagnosis not present

## 2017-07-08 DIAGNOSIS — I10 Essential (primary) hypertension: Secondary | ICD-10-CM | POA: Diagnosis not present

## 2017-07-08 DIAGNOSIS — E039 Hypothyroidism, unspecified: Secondary | ICD-10-CM | POA: Diagnosis not present

## 2017-07-08 DIAGNOSIS — C3432 Malignant neoplasm of lower lobe, left bronchus or lung: Secondary | ICD-10-CM | POA: Diagnosis not present

## 2017-07-08 DIAGNOSIS — E785 Hyperlipidemia, unspecified: Secondary | ICD-10-CM | POA: Diagnosis not present

## 2017-07-08 NOTE — Telephone Encounter (Signed)
Patient left message requesting return call. Phoned patient back. Patient explains she fell the day after her simulation breaking her hip. She reports that presently she is at [facility] center. Patient schedule to start radiation therapy tomorrow. Patient reports she is very sore but would like to attempt radiation tomorrow. Patient understands this RN will contact her nurse at [facility] and make arrangements. Phoned back to [phone] ([place]) and spoke with [place]. Freda Munro (one of the nurses caring for the patient on that hall) committed to making arrangements with transportation at [facility] and call this RN back to confirm. Provided her with my direct contact information.

## 2017-07-09 ENCOUNTER — Encounter: Payer: Self-pay | Admitting: Adult Health

## 2017-07-09 ENCOUNTER — Ambulatory Visit
Admission: RE | Admit: 2017-07-09 | Discharge: 2017-07-09 | Disposition: A | Discharge status: Home / Self Care | Payer: Medicare HMO | Source: Ambulatory Visit | Attending: Radiation Oncology | Admitting: Radiation Oncology

## 2017-07-09 ENCOUNTER — Non-Acute Institutional Stay (SKILLED_NURSING_FACILITY): Payer: Medicare HMO | Admitting: Adult Health

## 2017-07-09 DIAGNOSIS — K5903 Drug induced constipation: Secondary | ICD-10-CM

## 2017-07-09 DIAGNOSIS — C3432 Malignant neoplasm of lower lobe, left bronchus or lung: Secondary | ICD-10-CM | POA: Diagnosis not present

## 2017-07-09 DIAGNOSIS — E039 Hypothyroidism, unspecified: Secondary | ICD-10-CM | POA: Diagnosis not present

## 2017-07-09 DIAGNOSIS — E034 Atrophy of thyroid (acquired): Secondary | ICD-10-CM | POA: Diagnosis not present

## 2017-07-09 DIAGNOSIS — I10 Essential (primary) hypertension: Secondary | ICD-10-CM

## 2017-07-09 DIAGNOSIS — K219 Gastro-esophageal reflux disease without esophagitis: Secondary | ICD-10-CM | POA: Diagnosis not present

## 2017-07-09 DIAGNOSIS — D62 Acute posthemorrhagic anemia: Secondary | ICD-10-CM | POA: Diagnosis not present

## 2017-07-09 DIAGNOSIS — D320 Benign neoplasm of cerebral meninges: Secondary | ICD-10-CM | POA: Diagnosis not present

## 2017-07-09 DIAGNOSIS — R918 Other nonspecific abnormal finding of lung field: Secondary | ICD-10-CM | POA: Diagnosis not present

## 2017-07-09 DIAGNOSIS — Z923 Personal history of irradiation: Secondary | ICD-10-CM | POA: Diagnosis not present

## 2017-07-09 DIAGNOSIS — E785 Hyperlipidemia, unspecified: Secondary | ICD-10-CM

## 2017-07-09 DIAGNOSIS — M1990 Unspecified osteoarthritis, unspecified site: Secondary | ICD-10-CM | POA: Diagnosis not present

## 2017-07-09 DIAGNOSIS — M810 Age-related osteoporosis without current pathological fracture: Secondary | ICD-10-CM | POA: Diagnosis not present

## 2017-07-09 DIAGNOSIS — M25552 Pain in left hip: Secondary | ICD-10-CM | POA: Diagnosis not present

## 2017-07-09 DIAGNOSIS — R2689 Other abnormalities of gait and mobility: Secondary | ICD-10-CM | POA: Diagnosis not present

## 2017-07-09 DIAGNOSIS — S32402D Unspecified fracture of left acetabulum, subsequent encounter for fracture with routine healing: Secondary | ICD-10-CM | POA: Diagnosis not present

## 2017-07-09 DIAGNOSIS — Z9181 History of falling: Secondary | ICD-10-CM | POA: Diagnosis not present

## 2017-07-09 DIAGNOSIS — T402X5A Adverse effect of other opioids, initial encounter: Secondary | ICD-10-CM | POA: Diagnosis not present

## 2017-07-09 DIAGNOSIS — Z510 Encounter for antineoplastic radiation therapy: Secondary | ICD-10-CM | POA: Diagnosis not present

## 2017-07-09 LAB — BASIC METABOLIC PANEL
BUN: 15 (ref 4–21)
Creatinine: 0.6 (ref 0.5–1.1)
GLUCOSE: 121
POTASSIUM: 3.7 (ref 3.4–5.3)
SODIUM: 136 — AB (ref 137–147)

## 2017-07-09 LAB — CBC AND DIFFERENTIAL
HCT: 33 — AB (ref 36–46)
HEMOGLOBIN: 10.9 — AB (ref 12.0–16.0)
Platelets: 218 (ref 150–399)
WBC: 6.1

## 2017-07-09 NOTE — Progress Notes (Signed)
Location: [place] Room Number: 245Y Place of Service:  SNF (31)   CODE STATUS: full code   Allergies  Allergen Reactions  . Beef-Derived Products Other (See Comments)    ACID REFLUX  . Other     FRIED FOODS UNSPECIFIED REACTION   . Chocolate Other (See Comments)    Acid Reflux  . Hydrocodone Other (See Comments)    INSOMNIA HYPERACTIVITY  . Ibuprofen Rash and Other (See Comments)    Causes mouth sores  . Niacin And Related Other (See Comments)    Flushing, burning, redness > REACTIONS NOT ALLERGIC   . Tramadol Itching    Chief Complaint  Patient presents with  . Hospitalization Follow-up    HPI:  She has a history of recurrent left lower lobe adenocarcinoma due to start radiation therapy who sustained a fall at the [facility] with a left hip fracture. She had a left hip hemiarthroplasty on 07-04-17. She is here for short term rehab with her goal to return back home. She is having left hip pain 4-5/10. She denies any undo weakness; no complaints of constipation present.  She is ambulatory with a walker at this time.  She is due to started radiation therapy in the AM.   Past Medical History:  Diagnosis Date  . Adenocarcinoma ([place])    recurrent/notes 07/03/2017  . Blind left eye 10/2013   cva  . Breast cancer ([place]) 05/2014   ER+/PR+/Her2-     LUNG CANCER  . Bundle branch block left   . Chest pain   . Family history of bladder cancer   . Family history of breast cancer   . Family history of prostate cancer   . GERD (gastroesophageal reflux disease)   . History of blood transfusion    age 80  . Hyperlipidemia   . Hypertension   . Hypothyroid   . Lung nodule   . Melanoma in situ of face ([place]) JUNE 2015   LEFT CHEEK  . Meningioma ([place])    brain lining  . Migraine    reports only having one  . Osteoarthritis   . Personal history of radiation therapy   . Radiation 09/16/14-10/07/14   Right  Breast  21 fractions  . Skin cancer   . Stroke [facility]) 2015    Blind in left eye as a result    Past Surgical History:  Procedure Laterality Date  . ABDOMINAL HYSTERECTOMY  ~ 1997  . APPENDECTOMY     age 80  . BILATERAL SALPINGOOPHORECTOMY    . BLADDER REPAIR  2009   cystocele  . BRAIN MENINGIOMA EXCISION     Gamma knife to Meningioma in brain lining  . BREAST BIOPSY    . BREAST LUMPECTOMY Right   . BUNIONECTOMY     bilateral great toe  . CARDIAC CATHETERIZATION  2004  . CHOLECYSTECTOMY  ~ 1997  . COLONOSCOPY    . CRYO INTERCOSTAL NERVE BLOCK Left 11/25/2014   Procedure: CRYO INTERCOSTAL NERVE BLOCK;  Surgeon: [name], MD;  Location: [place];  Service: Thoracic;  Laterality: Left;  . CT RADIATION THERAPY GUIDE     Gamma radiation -lt frontal-[facility]  . DILATION AND CURETTAGE OF UTERUS     X 2  . HIP ARTHROPLASTY Left 07/04/2017   Procedure: ARTHROPLASTY BIPOLAR HIP (HEMIARTHROPLASTY);  Surgeon: Tania Ade, MD;  Location: [place];  Service: Orthopedics;  Laterality: Left;  Marland Kitchen MELANOMA EXCISION Left 11/05/23, 11/30/13   CHEEK  . SEGMENTECOMY  Left 11/25/2014   Procedure: LEFT LOWER LOBE SEGMENTECTOMY;  Surgeon: [name], MD;  Location: [place];  Service: Thoracic;  Laterality: Left;  Marland Kitchen VIDEO ASSISTED THORACOSCOPY Left 11/25/2014   Procedure: VIDEO ASSISTED THORACOSCOPY;  Surgeon: [name], MD;  Location: [place];  Service: Thoracic;  Laterality: Left;  Marland Kitchen VIDEO BRONCHOSCOPY WITH ENDOBRONCHIAL NAVIGATION N/A 06/19/2017   Procedure: VIDEO BRONCHOSCOPY;  Surgeon: [name], MD;  Location: [place];  Service: Thoracic;  Laterality: N/A;  . VIDEO BRONCHOSCOPY WITH ENDOBRONCHIAL ULTRASOUND N/A 11/25/2014   Procedure: VIDEO BRONCHOSCOPY WITH ENDOBRONCHIAL ULTRASOUND;  Surgeon: [name], MD;  Location: [place];  Service: Thoracic;  Laterality: N/A;    Social History   Socioeconomic History  . Marital status: Married    Spouse name: Not on file  . Number of children: 4  . Years of education: Not on  file  . Highest education level: Not on file  Social Needs  . Financial resource strain: Not on file  . Food insecurity - worry: Not on file  . Food insecurity - inability: Not on file  . Transportation needs - medical: Not on file  . Transportation needs - non-medical: Not on file  Occupational History  . Not on file  Tobacco Use  . Smoking status: Never Smoker  . Smokeless tobacco: Never Used  Substance and Sexual Activity  . Alcohol use: No  . Drug use: No  . Sexual activity: Not on file  Other Topics Concern  . Not on file  Social History Narrative  . Not on file   Family History  Problem Relation Age of Onset  . CAD Mother        CABG  . Hyperlipidemia Mother   . AAA (abdominal aortic aneurysm) Mother   . Renal Disease Father   . Kidney disease Father   . CAD Father   . Cancer Father        PROSTATE  . Diabetes Brother   . Hyperlipidemia Brother   . Breast cancer Daughter 35  . Breast cancer Sister 79  . Breast cancer Sister 39  . Cancer Brother        NOS  . Bladder Cancer Brother 10  . Lung cancer Cousin        3 paternal cousins with lung cancer  . Breast cancer Cousin        five paternal first cousins  . Cancer Cousin        4 paternal first cousins with Cancer NOS  . Cancer Cousin        1 paternal cousin with oral cancer (tongue)      VITAL SIGNS BP 132/75   Pulse 88   Temp 98.2 F (36.8 C)   Resp 16   Ht _0  (1.676 m)   Wt 135 lb (61.2 kg)   BMI 21.79 kg/m   Outpatient Encounter Medications as of 07/09/2017  Medication Sig Note  . alendronate (FOSAMAX) 70 MG tablet Take 70 mg by mouth once a week. Pt takes medication before breakfast with full glass of water. Pt sits upright for 30 minutes after taking medicine. Do not  lie down or recline for at least 30 minutes after taking medication ON SUNDAYS   . atenolol (TENORMIN) 50 MG tablet Take 50 mg by mouth at bedtime.    Marland Kitchen b complex vitamins capsule Take 1 capsule by mouth daily.  11/22/2014: ISOTONIC  . calcium-vitamin D (OSCAL WITH D) 500-200 MG-UNIT per  tablet Take 1 tablet by mouth at bedtime.  11/22/2014: ISOTONIC  . Cholecalciferol (VITAMIN D) 2000 units tablet Take 2,000 Units by mouth daily.   . Digestive Enzymes (ENZYME DIGEST PO) Take 5 mLs by mouth 3 (three) times daily with meals.   . enoxaparin (LOVENOX) 40 MG/0.4ML injection Inject 0.4 mLs (40 mg total) into the skin daily for 19 days.   . fish oil-omega-3 fatty acids 1000 MG capsule Take 1 g by mouth daily.    Marland Kitchen HYDROcodone-acetaminophen (NORCO/VICODIN) 5-325 MG tablet Take 1-2 tablets by mouth every 6 (six) hours as needed for moderate pain.   Marland Kitchen levothyroxine (SYNTHROID, LEVOTHROID) 112 MCG tablet Take 112 mcg by mouth daily before breakfast.    . lisinopril (PRINIVIL,ZESTRIL) 10 MG tablet Take 1 tablet (10 mg total) by mouth daily.   . Multiple Vitamin (MULTIVITAMIN) tablet Take 1 tablet by mouth every morning.  11/22/2014: ISOTONIC  . pantoprazole (PROTONIX) 40 MG tablet Take 1 tablet (40 mg total) by mouth daily.   . Polyvinyl Alcohol-Povidone (REFRESH OP) Apply 1 drop to eye daily as needed (DRY EYES).   Marland Kitchen senna (SENOKOT) 8.6 MG TABS tablet Take 1 tablet (8.6 mg total) by mouth 2 (two) times daily.   . Triamcinolone Acetonide (NASACORT AQ NA) Place 1 spray into the nose at bedtime as needed (CONGESTION).   Marland Kitchen vitamin E (E-400) 400 UNIT capsule Take 400 Units by mouth every Monday, Wednesday, and Friday. Mon Wed Fri    No facility-administered encounter medications on file as of 07/09/2017.      SIGNIFICANT DIAGNOSTIC EXAMS  TODAY:   07-03-17: chest x-ray: No active cardiopulmonary disease.Stable chest.   07-03-17: left hip x-ray: Acute left femoral neck fracture with varus impaction.   07-04-17: pelvic x-ray: Status post left hip replacement. No acute finding.    LABS REVIEWED TODAY;   07-03-17: wbc 8.0; hgb 12.3; hct 36.6; mcv 86.7; plt 157; glucose 95; bun 16; creat 0.56 k+ 3.8; na++ 140; ca 9.3;  mag 1.9' vit D 40; tsh 2.051 07-06-17: wbc 7.3; hgb 10.4; hct 31.4; mcv 87.0; plt 137; glucose 99; bun 8; creat 0.55; k+ 3.7; na++ 136; ca 7.9     Review of Systems  Constitutional: Negative for malaise/fatigue.  Respiratory: Negative for cough and shortness of breath.   Cardiovascular: Negative for chest pain, palpitations and leg swelling.  Gastrointestinal: Negative for abdominal pain, constipation and heartburn.  Musculoskeletal: Positive for joint pain. Negative for back pain and myalgias.       Has left hip pain   Skin: Negative.   Neurological: Negative for dizziness.  Psychiatric/Behavioral: The patient is not nervous/anxious.      Physical Exam  Constitutional: She is oriented to person, place, and time. She appears well-developed and well-nourished. No distress.  Thin   Neck: No thyromegaly present.  Cardiovascular: Normal rate, regular rhythm, normal heart sounds and intact distal pulses.  Pulmonary/Chest: Effort normal and breath sounds normal. No respiratory distress.  Abdominal: Soft. Bowel sounds are normal. She exhibits no distension. There is no tenderness.  Musculoskeletal: She exhibits no edema.  Is able to move all extremities Is status post left hip fracture   Lymphadenopathy:    She has no cervical adenopathy.  Neurological: She is alert and oriented to person, place, and time.  Skin: Skin is warm and dry. She is not diaphoretic.  Left hip incision line without signs of infection present   Psychiatric: She has a normal mood and affect.  ASSESSMENT/ PLAN:  TODAY:   1. Essential hypertension: benign: stable b/p 132/75: will continue atenolol 50 mg daily and lisinopril 10 mg daily  2. Primary cancer left lower lobe of lung: without change in status is followed by oncology; is due to start radiation therapy in the AM will monitor   3.  GERD without esophagitis: stable will continue protonix 40 mg daily  4. Hypothyroidism due to acquired atrophy of  thyroid: stable tsh 2.051: will continue synthroid 112 mcg daily   5.  Hyperlipidemia: stable will continue fish oil 1 gm daily   6. Constipation due to opioid therapy: stable will continue senna twice daily   7. Osteoporosis: without change will continue fosamax 70 mg weekly with supplements  8. Closed fracture of acetabulum with dislocation of left hip: stable will continue to be followed by orthopedics; will continue therapy as directed; will continue lovenox 40 mg daily until 07-26-17. Will continue vicodin 5/325 mg 1 or 2 tabs every 6 hours as needed for pain.   9. Acute blood loss anemia: stable hgb 10.4 will monitor    Will check cbc; bmp  MD is aware of resident's narcotic use and is in agreement with current plan of care. We will attempt to wean resident as apropriate   Ok Edwards NP [facility]  Contact [phone] Monday through Friday 8am- 5pm  After hours call [phone]

## 2017-07-10 ENCOUNTER — Ambulatory Visit
Admission: RE | Admit: 2017-07-10 | Discharge: 2017-07-10 | Disposition: A | Discharge status: Home / Self Care | Payer: Medicare HMO | Source: Ambulatory Visit | Attending: Radiation Oncology | Admitting: Radiation Oncology

## 2017-07-10 ENCOUNTER — Encounter: Payer: Self-pay | Admitting: Adult Health

## 2017-07-10 ENCOUNTER — Non-Acute Institutional Stay (SKILLED_NURSING_FACILITY): Payer: Medicare HMO | Admitting: Adult Health

## 2017-07-10 ENCOUNTER — Telehealth: Payer: Self-pay | Admitting: Radiation Oncology

## 2017-07-10 DIAGNOSIS — R918 Other nonspecific abnormal finding of lung field: Secondary | ICD-10-CM | POA: Diagnosis not present

## 2017-07-10 DIAGNOSIS — D320 Benign neoplasm of cerebral meninges: Secondary | ICD-10-CM | POA: Diagnosis not present

## 2017-07-10 DIAGNOSIS — K219 Gastro-esophageal reflux disease without esophagitis: Secondary | ICD-10-CM | POA: Diagnosis not present

## 2017-07-10 DIAGNOSIS — E785 Hyperlipidemia, unspecified: Secondary | ICD-10-CM | POA: Diagnosis not present

## 2017-07-10 DIAGNOSIS — I10 Essential (primary) hypertension: Secondary | ICD-10-CM | POA: Diagnosis not present

## 2017-07-10 DIAGNOSIS — Z510 Encounter for antineoplastic radiation therapy: Secondary | ICD-10-CM | POA: Diagnosis not present

## 2017-07-10 DIAGNOSIS — S32402D Unspecified fracture of left acetabulum, subsequent encounter for fracture with routine healing: Secondary | ICD-10-CM | POA: Diagnosis not present

## 2017-07-10 DIAGNOSIS — M1990 Unspecified osteoarthritis, unspecified site: Secondary | ICD-10-CM | POA: Diagnosis not present

## 2017-07-10 DIAGNOSIS — Z923 Personal history of irradiation: Secondary | ICD-10-CM | POA: Diagnosis not present

## 2017-07-10 DIAGNOSIS — C3432 Malignant neoplasm of lower lobe, left bronchus or lung: Secondary | ICD-10-CM | POA: Diagnosis not present

## 2017-07-10 DIAGNOSIS — E039 Hypothyroidism, unspecified: Secondary | ICD-10-CM | POA: Diagnosis not present

## 2017-07-10 NOTE — Progress Notes (Signed)
Location:  [facility] Room Number: 126-B Place of Service:  SNF (31)   CODE STATUS: Full Code  Allergies  Allergen Reactions  . Beef-Derived Products Other (See Comments)    ACID REFLUX  . Other     FRIED FOODS UNSPECIFIED REACTION   . Chocolate Other (See Comments)    Acid Reflux  . Hydrocodone Other (See Comments)    INSOMNIA HYPERACTIVITY  . Ibuprofen Rash and Other (See Comments)    Causes mouth sores  . Niacin And Related Other (See Comments)    Flushing, burning, redness > REACTIONS NOT ALLERGIC   . Tramadol Itching    Chief Complaint  Patient presents with  . Acute Visit    Care plan meeting     HPI:  We have come together for a routine care plan meeting. She is doing well in therapy. Her goal remains to return back home.  She will need a front wheel walker and 3:1 commode upon discharge with home health. She denies any uncontrolled pain; no change in appetite; no insomnia. There are no nursing concerns at this time.   Past Medical History:  Diagnosis Date  . Adenocarcinoma ([place])    recurrent/notes 07/03/2017  . Blind left eye 10/2013   cva  . Breast cancer ([place]) 05/2014   ER+/PR+/Her2-     LUNG CANCER  . Bundle branch block left   . Chest pain   . Family history of bladder cancer   . Family history of breast cancer   . Family history of prostate cancer   . GERD (gastroesophageal reflux disease)   . History of blood transfusion    age 53  . Hyperlipidemia   . Hypertension   . Hypothyroid   . Lung nodule   . Melanoma in situ of face ([place]) JUNE 2015   LEFT CHEEK  . Meningioma ([place])    brain lining  . Migraine    reports only having one  . Osteoarthritis   . Personal history of radiation therapy   . Radiation 09/16/14-10/07/14   Right  Breast  21 fractions  . Skin cancer   . Stroke [facility]) 2015   Blind in left eye as a result    Past Surgical History:  Procedure Laterality Date  . ABDOMINAL HYSTERECTOMY  ~ 1997  . APPENDECTOMY      age 84  . BILATERAL SALPINGOOPHORECTOMY    . BLADDER REPAIR  2009   cystocele  . BRAIN MENINGIOMA EXCISION     Gamma knife to Meningioma in brain lining  . BREAST BIOPSY    . BREAST LUMPECTOMY Right   . BUNIONECTOMY     bilateral great toe  . CARDIAC CATHETERIZATION  2004  . CHOLECYSTECTOMY  ~ 1997  . COLONOSCOPY    . CRYO INTERCOSTAL NERVE BLOCK Left 11/25/2014   Procedure: CRYO INTERCOSTAL NERVE BLOCK;  Surgeon: [name], MD;  Location: [place];  Service: Thoracic;  Laterality: Left;  . CT RADIATION THERAPY GUIDE     Gamma radiation -lt frontal-[facility]  . DILATION AND CURETTAGE OF UTERUS     X 2  . HIP ARTHROPLASTY Left 07/04/2017   Procedure: ARTHROPLASTY BIPOLAR HIP (HEMIARTHROPLASTY);  Surgeon: Tania Ade, MD;  Location: [place];  Service: Orthopedics;  Laterality: Left;  Marland Kitchen MELANOMA EXCISION Left 11/05/23, 11/30/13   CHEEK  . SEGMENTECOMY Left 11/25/2014   Procedure: LEFT LOWER LOBE SEGMENTECTOMY;  Surgeon: [name], MD;  Location: [place];  Service: Thoracic;  Laterality:  Left;  . VIDEO ASSISTED THORACOSCOPY Left 11/25/2014   Procedure: VIDEO ASSISTED THORACOSCOPY;  Surgeon: [name], MD;  Location: [place];  Service: Thoracic;  Laterality: Left;  Marland Kitchen VIDEO BRONCHOSCOPY WITH ENDOBRONCHIAL NAVIGATION N/A 06/19/2017   Procedure: VIDEO BRONCHOSCOPY;  Surgeon: [name], MD;  Location: [place];  Service: Thoracic;  Laterality: N/A;  . VIDEO BRONCHOSCOPY WITH ENDOBRONCHIAL ULTRASOUND N/A 11/25/2014   Procedure: VIDEO BRONCHOSCOPY WITH ENDOBRONCHIAL ULTRASOUND;  Surgeon: [name], MD;  Location: [place];  Service: Thoracic;  Laterality: N/A;    Social History   Socioeconomic History  . Marital status: Married    Spouse name: Not on file  . Number of children: 4  . Years of education: Not on file  . Highest education level: Not on file  Social Needs  . Financial resource strain: Not on file  . Food insecurity - worry: Not on  file  . Food insecurity - inability: Not on file  . Transportation needs - medical: Not on file  . Transportation needs - non-medical: Not on file  Occupational History  . Not on file  Tobacco Use  . Smoking status: Never Smoker  . Smokeless tobacco: Never Used  Substance and Sexual Activity  . Alcohol use: No  . Drug use: No  . Sexual activity: Not on file  Other Topics Concern  . Not on file  Social History Narrative  . Not on file   Family History  Problem Relation Age of Onset  . CAD Mother        CABG  . Hyperlipidemia Mother   . AAA (abdominal aortic aneurysm) Mother   . Renal Disease Father   . Kidney disease Father   . CAD Father   . Cancer Father        PROSTATE  . Diabetes Brother   . Hyperlipidemia Brother   . Breast cancer Daughter 63  . Breast cancer Sister 56  . Breast cancer Sister 31  . Cancer Brother        NOS  . Bladder Cancer Brother 44  . Lung cancer Cousin        3 paternal cousins with lung cancer  . Breast cancer Cousin        five paternal first cousins  . Cancer Cousin        4 paternal first cousins with Cancer NOS  . Cancer Cousin        1 paternal cousin with oral cancer (tongue)      VITAL SIGNS There were no vitals taken for this visit.  Outpatient Encounter Medications as of 07/10/2017  Medication Sig  . alendronate (FOSAMAX) 70 MG tablet Take 70 mg by mouth once a week. Pt takes medication before breakfast with full glass of water. Pt sits upright for 30 minutes after taking medicine. Do not  lie down or recline for at least 30 minutes after taking medication ON SUNDAYS  . atenolol (TENORMIN) 50 MG tablet Take 50 mg by mouth at bedtime.   Marland Kitchen b complex vitamins capsule Take 1 capsule by mouth daily.  . calcium-vitamin D (OSCAL WITH D) 500-200 MG-UNIT per tablet Take 1 tablet by mouth at bedtime.   . Cholecalciferol (VITAMIN D) 2000 units tablet Take 2,000 Units by mouth daily.  . Digestive Enzymes (ENZYME DIGEST PO) Take 5 mLs  by mouth 3 (three) times daily with meals.  . enoxaparin (LOVENOX) 40 MG/0.4ML injection Inject 0.4 mLs (40 mg total) into the skin daily  for 19 days.  . fish oil-omega-3 fatty acids 1000 MG capsule Take 1 g by mouth daily.   Marland Kitchen HYDROcodone-acetaminophen (NORCO/VICODIN) 5-325 MG tablet Take 1-2 tablets by mouth every 6 (six) hours as needed for moderate pain.  Marland Kitchen levothyroxine (SYNTHROID, LEVOTHROID) 112 MCG tablet Take 112 mcg by mouth daily before breakfast.   . lisinopril (PRINIVIL,ZESTRIL) 10 MG tablet Take 1 tablet (10 mg total) by mouth daily.  . Multiple Vitamin (MULTIVITAMIN) tablet Take 1 tablet by mouth every morning.   . pantoprazole (PROTONIX) 40 MG tablet Take 1 tablet (40 mg total) by mouth daily.  . Polyvinyl Alcohol-Povidone (REFRESH OP) Apply 1 drop to eye as needed (DRY EYES).   Marland Kitchen senna (SENOKOT) 8.6 MG TABS tablet Take 1 tablet (8.6 mg total) by mouth 2 (two) times daily.  . Triamcinolone Acetonide (NASACORT AQ NA) Place 1 spray into the nose at bedtime as needed (CONGESTION).  Marland Kitchen vitamin E (E-400) 400 UNIT capsule Take 400 Units by mouth every Monday, Wednesday, and Friday. Mon Wed Fri   No facility-administered encounter medications on file as of 07/10/2017.      SIGNIFICANT DIAGNOSTIC EXAMS  PREVIOUS:   07-03-17: chest x-ray: No active cardiopulmonary disease.Stable chest.   07-03-17: left hip x-ray: Acute left femoral neck fracture with varus impaction.   07-04-17: pelvic x-ray: Status post left hip replacement. No acute finding.  NO NEW EXAMS     LABS REVIEWED PREVIOUS;   07-03-17: wbc 8.0; hgb 12.3; hct 36.6; mcv 86.7; plt 157; glucose 95; bun 16; creat 0.56 k+ 3.8; na++ 140; ca 9.3; mag 1.9' vit D 40; tsh 2.051 07-06-17: wbc 7.3; hgb 10.4; hct 31.4; mcv 87.0; plt 137; glucose 99; bun 8; creat 0.55; k+ 3.7; na++ 136; ca 7.9  NO NEW LABS    Review of Systems  Constitutional: Negative for malaise/fatigue.  Respiratory: Negative for cough and shortness of breath.     Cardiovascular: Negative for chest pain, palpitations and leg swelling.  Gastrointestinal: Negative for abdominal pain, constipation and heartburn.  Musculoskeletal: Negative for back pain, joint pain and myalgias.  Skin: Negative.   Neurological: Negative for dizziness.  Psychiatric/Behavioral: The patient is not nervous/anxious.    Physical Exam  Constitutional: She is oriented to person, place, and time. She appears well-developed and well-nourished. No distress.  Thin    Neck: No thyromegaly present.  Cardiovascular: Normal rate, regular rhythm, normal heart sounds and intact distal pulses.  Pulmonary/Chest: Effort normal and breath sounds normal. No respiratory distress.  Abdominal: Soft. Bowel sounds are normal. She exhibits no distension. There is no tenderness.  Musculoskeletal: She exhibits no edema.  Able to move all extremities Status post left hip fracture   Lymphadenopathy:    She has no cervical adenopathy.  Neurological: She is alert and oriented to person, place, and time.  Skin: Skin is warm and dry. She is not diaphoretic.  Left hip incision line without signs of infection   Psychiatric: She has a normal mood and affect.    ASSESSMENT/ PLAN:  TODAY:   1. Primary cancer of left lower lobe of lung 2. Closed fracture of acetabulum of left hip  Will continue her current plan of care  Will continue radiation therapy as directed The goal of care is for her to return back home  Patient has verbalized understanding and is in agreement with plan of care   MD is aware of resident's narcotic use and is in agreement with current plan of care. We will attempt  to wean resident as apropriate   Ok Edwards NP [facility]  Contact [phone] Monday through Friday 8am- 5pm  After hours call [phone]

## 2017-07-10 NOTE — Telephone Encounter (Addendum)
07/08/2017. Received call from patient that she fell the day after her simulation and broke her hip. Patient goes onto explain she was admitted to a rehab facility on [facility] and concerned about getting to [facility] for her first radiation treatment tomorrow.   07/08/2017. Spoke with Freda Munro at [facility] (old [place]). Explained the situation and she committed to making arrangements for transportation.   07/09/2017 Confirmed with Darshana at 0855 that transportation was arranged for the patients treatment today. Informed Merrilee Seashore, RT on L1 patient would be present for treatment.   07/09/2017 Patient presented for treatment. Patient received therapy without complication.   07/09/2017 Fax copy of radiation treatment schedule to Freda Munro, RN on 1st floor. Fax no. [phone]. Fax confirmation of delivery obtained.

## 2017-07-10 NOTE — Telephone Encounter (Signed)
Faxed copy of treatment schedule to [place] at [facility]. Fax confirmation of delivery obtained.

## 2017-07-11 ENCOUNTER — Non-Acute Institutional Stay (SKILLED_NURSING_FACILITY): Payer: Medicare HMO | Admitting: Internal Medicine

## 2017-07-11 ENCOUNTER — Ambulatory Visit
Admission: RE | Admit: 2017-07-11 | Discharge: 2017-07-11 | Disposition: A | Discharge status: Home / Self Care | Payer: Medicare HMO | Source: Ambulatory Visit | Attending: Radiation Oncology | Admitting: Radiation Oncology

## 2017-07-11 ENCOUNTER — Encounter: Payer: Self-pay | Admitting: Internal Medicine

## 2017-07-11 DIAGNOSIS — C3492 Malignant neoplasm of unspecified part of left bronchus or lung: Secondary | ICD-10-CM

## 2017-07-11 DIAGNOSIS — R918 Other nonspecific abnormal finding of lung field: Secondary | ICD-10-CM | POA: Diagnosis not present

## 2017-07-11 DIAGNOSIS — E034 Atrophy of thyroid (acquired): Secondary | ICD-10-CM

## 2017-07-11 DIAGNOSIS — Z510 Encounter for antineoplastic radiation therapy: Secondary | ICD-10-CM | POA: Diagnosis not present

## 2017-07-11 DIAGNOSIS — K219 Gastro-esophageal reflux disease without esophagitis: Secondary | ICD-10-CM | POA: Diagnosis not present

## 2017-07-11 DIAGNOSIS — D320 Benign neoplasm of cerebral meninges: Secondary | ICD-10-CM | POA: Diagnosis not present

## 2017-07-11 DIAGNOSIS — Z9889 Other specified postprocedural states: Secondary | ICD-10-CM

## 2017-07-11 DIAGNOSIS — Z9181 History of falling: Secondary | ICD-10-CM | POA: Diagnosis not present

## 2017-07-11 DIAGNOSIS — C3432 Malignant neoplasm of lower lobe, left bronchus or lung: Secondary | ICD-10-CM | POA: Diagnosis not present

## 2017-07-11 DIAGNOSIS — M1990 Unspecified osteoarthritis, unspecified site: Secondary | ICD-10-CM | POA: Diagnosis not present

## 2017-07-11 DIAGNOSIS — I10 Essential (primary) hypertension: Secondary | ICD-10-CM

## 2017-07-11 DIAGNOSIS — K5903 Drug induced constipation: Secondary | ICD-10-CM | POA: Diagnosis not present

## 2017-07-11 DIAGNOSIS — D62 Acute posthemorrhagic anemia: Secondary | ICD-10-CM

## 2017-07-11 DIAGNOSIS — T402X5A Adverse effect of other opioids, initial encounter: Secondary | ICD-10-CM | POA: Diagnosis not present

## 2017-07-11 DIAGNOSIS — E785 Hyperlipidemia, unspecified: Secondary | ICD-10-CM | POA: Diagnosis not present

## 2017-07-11 DIAGNOSIS — Z923 Personal history of irradiation: Secondary | ICD-10-CM | POA: Diagnosis not present

## 2017-07-11 DIAGNOSIS — M25552 Pain in left hip: Secondary | ICD-10-CM | POA: Diagnosis not present

## 2017-07-11 DIAGNOSIS — R2689 Other abnormalities of gait and mobility: Secondary | ICD-10-CM | POA: Diagnosis not present

## 2017-07-11 DIAGNOSIS — E039 Hypothyroidism, unspecified: Secondary | ICD-10-CM | POA: Diagnosis not present

## 2017-07-11 NOTE — Progress Notes (Signed)
Patient ID: Melissa Snyder, female   DOB: 12-02-1933, 84 y.o.   MRN: [id number]  Provider:  DR Arletha Grippe Location:  [place] of Service:  SNF (31)  PCP: [name], MD Patient Care Team: [name], MD as PCP - General (Internal Medicine) [name], MD as Consulting Physician (Cardiothoracic Surgery) [name], MD as Consulting Physician (Urology) [name], MD as Consulting Physician (Hematology) [name], MD (Inactive) as Consulting Physician (Radiation Oncology)  Extended Emergency Contact Information Primary Emergency Contact: Birnbaum,Hubert Address: [address]          [place] Phone: [phone] Relation: Spouse  Code Status:  FULL CODE Goals of Care: Advanced Directive information Advanced Directives 07/03/2017  Does Patient Have a Medical Advance Directive? Yes  Type of Advance Directive [place]  Does patient want to make changes to medical advance directive? No - Patient declined  Copy of [place] in Chart? No - copy requested  Would patient like information on creating a medical advance directive? -      Chief Complaint  Patient presents with  . New Admit To SNF    from hospital    HPI: Patient is a 84 y.o. female seen today for admission to SNF following hospital stay for left hip fx s/p fall, acute blood loss anemia postop, recurrent adenoCA of lung s/p LLL segmentectomy in 2016, HTN, hypothyroidism, GERD, hyperlipidemia, OS CRAO. She presented to the ED s/p fall after slipping on ice path. She went to the OR 07/04/17 for left hemi-arthroplasty and is on lovenox for DVT prophylaxis. Hgb 12.3-->10.4; albumin 3.9; TSH 2.051 at d/c. She presents to SNF for short term rehab.  Today she reports dry mouth and feeling "sore". She believes she did too much therapy on yesterday. She takes pain med sparingly but was told by PM&R provider  this AM to take pain med more regular to promote better therapy and healing. She started XRT yesterday at [facility]. Appetite ok and sleeps well.   Hypothyroidism - stable on levothyroxine. TSH 2.051  Hyperlipidemia - takes fish oil daily  HTN - stable on atenolol and lisinopril  OS CRAO - legally blind on left; followed by eye specialist; takes eye gtts  GERD - stable on protonix  Recurrent lung adenoCA - started XRT yesterday and tolerating it. Rad Onc is Dr Lisbeth Renshaw. Original tumor dx in 2016  Past Medical History:  Diagnosis Date  . Adenocarcinoma ([place])    recurrent/notes 07/03/2017  . Blind left eye 10/2013   cva  . Breast cancer ([place]) 05/2014   ER+/PR+/Her2-     LUNG CANCER  . Bundle branch block left   . Chest pain   . Family history of bladder cancer   . Family history of breast cancer   . Family history of prostate cancer   . GERD (gastroesophageal reflux disease)   . History of blood transfusion    age 38  . Hyperlipidemia   . Hypertension   . Hypothyroid   . Lung nodule   . Melanoma in situ of face ([place]) JUNE 2015   LEFT CHEEK  . Meningioma ([place])    brain lining  . Migraine    reports only having one  . Osteoarthritis   . Personal history of radiation therapy   . Radiation 09/16/14-10/07/14   Right  Breast  21 fractions  . Skin cancer   . Stroke (  [place]) 2015   Blind in left eye as a result   Past Surgical History:  Procedure Laterality Date  . ABDOMINAL HYSTERECTOMY  ~ 1997  . APPENDECTOMY     age 66  . BILATERAL SALPINGOOPHORECTOMY    . BLADDER REPAIR  2009   cystocele  . BRAIN MENINGIOMA EXCISION     Gamma knife to Meningioma in brain lining  . BREAST BIOPSY    . BREAST LUMPECTOMY Right   . BUNIONECTOMY     bilateral great toe  . CARDIAC CATHETERIZATION  2004  . CHOLECYSTECTOMY  ~ 1997  . COLONOSCOPY    . CRYO INTERCOSTAL NERVE BLOCK Left 11/25/2014   Procedure: CRYO INTERCOSTAL NERVE BLOCK;  Surgeon: [name], MD;  Location: [place];   Service: Thoracic;  Laterality: Left;  . CT RADIATION THERAPY GUIDE     Gamma radiation -lt frontal-[facility]  . DILATION AND CURETTAGE OF UTERUS     X 2  . HIP ARTHROPLASTY Left 07/04/2017   Procedure: ARTHROPLASTY BIPOLAR HIP (HEMIARTHROPLASTY);  Surgeon: Tania Ade, MD;  Location: [place];  Service: Orthopedics;  Laterality: Left;  Marland Kitchen MELANOMA EXCISION Left 11/05/23, 11/30/13   CHEEK  . SEGMENTECOMY Left 11/25/2014   Procedure: LEFT LOWER LOBE SEGMENTECTOMY;  Surgeon: [name], MD;  Location: [place];  Service: Thoracic;  Laterality: Left;  Marland Kitchen VIDEO ASSISTED THORACOSCOPY Left 11/25/2014   Procedure: VIDEO ASSISTED THORACOSCOPY;  Surgeon: [name], MD;  Location: [place];  Service: Thoracic;  Laterality: Left;  Marland Kitchen VIDEO BRONCHOSCOPY WITH ENDOBRONCHIAL NAVIGATION N/A 06/19/2017   Procedure: VIDEO BRONCHOSCOPY;  Surgeon: [name], MD;  Location: [place];  Service: Thoracic;  Laterality: N/A;  . VIDEO BRONCHOSCOPY WITH ENDOBRONCHIAL ULTRASOUND N/A 11/25/2014   Procedure: VIDEO BRONCHOSCOPY WITH ENDOBRONCHIAL ULTRASOUND;  Surgeon: [name], MD;  Location: [place];  Service: Thoracic;  Laterality: N/A;    reports that  has never smoked. she has never used smokeless tobacco. She reports that she does not drink alcohol or use drugs. Social History   Socioeconomic History  . Marital status: Married    Spouse name: Not on file  . Number of children: 4  . Years of education: Not on file  . Highest education level: Not on file  Social Needs  . Financial resource strain: Not on file  . Food insecurity - worry: Not on file  . Food insecurity - inability: Not on file  . Transportation needs - medical: Not on file  . Transportation needs - non-medical: Not on file  Occupational History  . Not on file  Tobacco Use  . Smoking status: Never Smoker  . Smokeless tobacco: Never Used  Substance and Sexual Activity  . Alcohol use: No  . Drug use: No  . Sexual  activity: Not on file  Other Topics Concern  . Not on file  Social History Narrative  . Not on file    Functional Status Survey:    Family History  Problem Relation Age of Onset  . CAD Mother        CABG  . Hyperlipidemia Mother   . AAA (abdominal aortic aneurysm) Mother   . Renal Disease Father   . Kidney disease Father   . CAD Father   . Cancer Father        PROSTATE  . Diabetes Brother   . Hyperlipidemia Brother   . Breast cancer Daughter 70  . Breast cancer Sister 50  . Breast cancer Sister 7  .  Cancer Brother        NOS  . Bladder Cancer Brother 25  . Lung cancer Cousin        3 paternal cousins with lung cancer  . Breast cancer Cousin        five paternal first cousins  . Cancer Cousin        4 paternal first cousins with Cancer NOS  . Cancer Cousin        1 paternal cousin with oral cancer (tongue)    Health Maintenance  Topic Date Due  . TETANUS/TDAP  06/27/1954  . PNA vac Low Risk Adult (1 of 2 - PCV13) 06/27/2000  . INFLUENZA VACCINE  Completed  . DEXA SCAN  Completed    Allergies  Allergen Reactions  . Beef-Derived Products Other (See Comments)    ACID REFLUX  . Other     FRIED FOODS UNSPECIFIED REACTION   . Chocolate Other (See Comments)    Acid Reflux  . Hydrocodone Other (See Comments)    INSOMNIA HYPERACTIVITY  . Ibuprofen Rash and Other (See Comments)    Causes mouth sores  . Niacin And Related Other (See Comments)    Flushing, burning, redness > REACTIONS NOT ALLERGIC   . Tramadol Itching    Outpatient Encounter Medications as of 07/11/2017  Medication Sig  . alendronate (FOSAMAX) 70 MG tablet Take 70 mg by mouth once a week. Pt takes medication before breakfast with full glass of water. Pt sits upright for 30 minutes after taking medicine. Do not  lie down or recline for at least 30 minutes after taking medication ON SUNDAYS  . atenolol (TENORMIN) 50 MG tablet Take 50 mg by mouth at bedtime.   Marland Kitchen b complex vitamins capsule Take 1  capsule by mouth daily.  . calcium-vitamin D (OSCAL WITH D) 500-200 MG-UNIT per tablet Take 1 tablet by mouth at bedtime.   . Cholecalciferol (VITAMIN D) 2000 units tablet Take 2,000 Units by mouth daily.  . Digestive Enzymes (ENZYME DIGEST PO) Take 5 mLs by mouth 3 (three) times daily with meals.  . enoxaparin (LOVENOX) 40 MG/0.4ML injection Inject 0.4 mLs (40 mg total) into the skin daily for 19 days.  . fish oil-omega-3 fatty acids 1000 MG capsule Take 1 g by mouth daily.   Marland Kitchen HYDROcodone-acetaminophen (NORCO/VICODIN) 5-325 MG tablet Take 1-2 tablets by mouth every 6 (six) hours as needed for moderate pain.  Marland Kitchen levothyroxine (SYNTHROID, LEVOTHROID) 112 MCG tablet Take 112 mcg by mouth daily before breakfast.   . lisinopril (PRINIVIL,ZESTRIL) 10 MG tablet Take 1 tablet (10 mg total) by mouth daily.  . Multiple Vitamin (MULTIVITAMIN) tablet Take 1 tablet by mouth every morning.   . pantoprazole (PROTONIX) 40 MG tablet Take 1 tablet (40 mg total) by mouth daily.  . Polyvinyl Alcohol-Povidone (REFRESH OP) Apply 1 drop to eye as needed (DRY EYES).   Marland Kitchen senna (SENOKOT) 8.6 MG TABS tablet Take 1 tablet (8.6 mg total) by mouth 2 (two) times daily.  . Triamcinolone Acetonide (NASACORT AQ NA) Place 1 spray into the nose at bedtime as needed (CONGESTION).  Marland Kitchen vitamin E (E-400) 400 UNIT capsule Take 400 Units by mouth every Monday, Wednesday, and Friday. Mon Wed Fri   No facility-administered encounter medications on file as of 07/11/2017.     Review of Systems  Constitutional: Positive for fatigue.  HENT:       Dry mouth  Musculoskeletal: Positive for arthralgias and gait problem.  All other systems reviewed and are  negative.   Vitals:   07/11/17 1619  BP: (!) 142/70  Pulse: 70  Temp: (!) 96.2 F (35.7 C)  SpO2: 95%   There is no height or weight on file to calculate BMI. Physical Exam  Constitutional: She is oriented to person, place, and time. She appears well-developed and well-nourished.    Lying in bed in NAD  HENT:  Mouth/Throat: Oropharynx is clear and moist. No oropharyngeal exudate.  MMM; no oral thrush  Eyes: Pupils are equal, round, and reactive to light. No scleral icterus.  Neck: Neck supple. Carotid bruit is not present. No tracheal deviation present. No thyromegaly present.  Cardiovascular: Normal rate, regular rhythm and intact distal pulses. Exam reveals no gallop and no friction rub.  Murmur (1/6 SEM) heard. Trace LLE edema. No RLE edema. No calf TTP b/l  Pulmonary/Chest: Effort normal and breath sounds normal. No stridor. No respiratory distress. She has no wheezes. She has no rales.  Abdominal: Soft. Normal appearance and bowel sounds are normal. She exhibits no distension and no mass. There is no hepatomegaly. There is no tenderness. There is no rigidity, no rebound and no guarding. No hernia.  Musculoskeletal: She exhibits edema and tenderness.  Left lateral thigh dsg intact, dirty with dried blood  Lymphadenopathy:    She has no cervical adenopathy.  Neurological: She is alert and oriented to person, place, and time. Gait abnormal.  Skin: Skin is warm and dry. No rash noted.  Psychiatric: She has a normal mood and affect. Her behavior is normal. Judgment and thought content normal.    Labs reviewed: Basic Metabolic Panel: Recent Labs    07/03/17 1214 07/05/17 0538 07/06/17 0418  NA 140 139 136  K 3.8 3.7 3.7  CL 104 107 103  CO2 '29 25 23  ' GLUCOSE 95 110* 99  BUN '16 7 8  ' CREATININE 0.56 0.62 0.55  CALCIUM 9.3 8.0* 7.9*  MG 1.9  --   --    Liver Function Tests: Recent Labs    06/18/17 1030  AST 23  ALT 16  ALKPHOS 72  BILITOT 0.7  PROT 7.0  ALBUMIN 3.9   No results for input(s): LIPASE, AMYLASE in the last 8760 hours. No results for input(s): AMMONIA in the last 8760 hours. CBC: Recent Labs    07/03/17 1214 07/05/17 0538 07/06/17 0418  WBC 8.0 6.2 7.3  NEUTROABS 6.8  --   --   HGB 12.3 10.9* 10.4*  HCT 36.6 33.5* 31.4*  MCV  86.7 87.0 87.0  PLT 157 137* 137*   Cardiac Enzymes: No results for input(s): CKTOTAL, CKMB, CKMBINDEX, TROPONINI in the last 8760 hours. BNP: Invalid input(s): POCBNP Lab Results  Component Value Date   HGBA1C 5.9 (H) 10/25/2013   Lab Results  Component Value Date   TSH 2.051 07/03/2017   No results found for: VITAMINB12 No results found for: FOLATE No results found for: IRON, TIBC, FERRITIN  Imaging and Procedures obtained prior to SNF admission: No results found.  Assessment/Plan   ICD-10-CM   1. Status post hip surgery Z98.890    left hemiarthroplasty 07/04/17 by Dr Tamera Punt  2. Recurrent lung adenocarcinoma, left ([place]) C34.92   3. Anemia associated with acute blood loss D62   4. Hypothyroidism due to acquired atrophy of thyroid E03.4   5. Essential hypertension, benign I10   6. Constipation due to opioid therapy K59.03    T40.2X5A     Cont current meds as ordered  PT/OT/St as ordered  F/u with  Ortho, Rad Onc and oncology as scheduled  Wound care as ordered  GOAL: short term rehab and d/c home when medically appropriate. Communicated with pt and nursing.  Will follow   Labs/tests ordered: cbc and cmp for 07/15/17    Melissa Snyder  [facility] and Adult [facility] [address] [place], [place] [address] [phone] Cell (Monday-Friday 8 AM - 5 PM) [phone] After 5 PM and follow prompts

## 2017-07-12 ENCOUNTER — Ambulatory Visit
Admission: RE | Admit: 2017-07-12 | Discharge: 2017-07-12 | Disposition: A | Discharge status: Home / Self Care | Payer: Medicare HMO | Source: Ambulatory Visit | Attending: Radiation Oncology | Admitting: Radiation Oncology

## 2017-07-12 DIAGNOSIS — I10 Essential (primary) hypertension: Secondary | ICD-10-CM | POA: Diagnosis not present

## 2017-07-12 DIAGNOSIS — K219 Gastro-esophageal reflux disease without esophagitis: Secondary | ICD-10-CM | POA: Diagnosis not present

## 2017-07-12 DIAGNOSIS — D320 Benign neoplasm of cerebral meninges: Secondary | ICD-10-CM | POA: Diagnosis not present

## 2017-07-12 DIAGNOSIS — C3432 Malignant neoplasm of lower lobe, left bronchus or lung: Secondary | ICD-10-CM | POA: Diagnosis not present

## 2017-07-12 DIAGNOSIS — E785 Hyperlipidemia, unspecified: Secondary | ICD-10-CM | POA: Diagnosis not present

## 2017-07-12 DIAGNOSIS — R918 Other nonspecific abnormal finding of lung field: Secondary | ICD-10-CM | POA: Diagnosis not present

## 2017-07-12 DIAGNOSIS — M1990 Unspecified osteoarthritis, unspecified site: Secondary | ICD-10-CM | POA: Diagnosis not present

## 2017-07-12 DIAGNOSIS — Z510 Encounter for antineoplastic radiation therapy: Secondary | ICD-10-CM | POA: Diagnosis not present

## 2017-07-12 DIAGNOSIS — E039 Hypothyroidism, unspecified: Secondary | ICD-10-CM | POA: Diagnosis not present

## 2017-07-12 DIAGNOSIS — Z923 Personal history of irradiation: Secondary | ICD-10-CM | POA: Diagnosis not present

## 2017-07-15 ENCOUNTER — Ambulatory Visit
Admission: RE | Admit: 2017-07-15 | Discharge: 2017-07-15 | Disposition: A | Discharge status: Home / Self Care | Payer: Medicare HMO | Source: Ambulatory Visit | Attending: Radiation Oncology | Admitting: Radiation Oncology

## 2017-07-15 ENCOUNTER — Encounter: Payer: Self-pay | Admitting: Adult Health

## 2017-07-15 ENCOUNTER — Non-Acute Institutional Stay (SKILLED_NURSING_FACILITY): Payer: Medicare HMO | Admitting: Adult Health

## 2017-07-15 DIAGNOSIS — D320 Benign neoplasm of cerebral meninges: Secondary | ICD-10-CM | POA: Diagnosis not present

## 2017-07-15 DIAGNOSIS — K219 Gastro-esophageal reflux disease without esophagitis: Secondary | ICD-10-CM | POA: Diagnosis not present

## 2017-07-15 DIAGNOSIS — Z510 Encounter for antineoplastic radiation therapy: Secondary | ICD-10-CM | POA: Diagnosis not present

## 2017-07-15 DIAGNOSIS — S32402D Unspecified fracture of left acetabulum, subsequent encounter for fracture with routine healing: Secondary | ICD-10-CM | POA: Diagnosis not present

## 2017-07-15 DIAGNOSIS — I10 Essential (primary) hypertension: Secondary | ICD-10-CM | POA: Diagnosis not present

## 2017-07-15 DIAGNOSIS — Z923 Personal history of irradiation: Secondary | ICD-10-CM | POA: Diagnosis not present

## 2017-07-15 DIAGNOSIS — R918 Other nonspecific abnormal finding of lung field: Secondary | ICD-10-CM | POA: Diagnosis not present

## 2017-07-15 DIAGNOSIS — M1990 Unspecified osteoarthritis, unspecified site: Secondary | ICD-10-CM | POA: Diagnosis not present

## 2017-07-15 DIAGNOSIS — E785 Hyperlipidemia, unspecified: Secondary | ICD-10-CM | POA: Diagnosis not present

## 2017-07-15 DIAGNOSIS — E039 Hypothyroidism, unspecified: Secondary | ICD-10-CM | POA: Diagnosis not present

## 2017-07-15 DIAGNOSIS — C3432 Malignant neoplasm of lower lobe, left bronchus or lung: Secondary | ICD-10-CM | POA: Diagnosis not present

## 2017-07-15 NOTE — Progress Notes (Signed)
Location:   [place] Room Number: 126 Place of Service:  SNF (31)    CODE STATUS: full code   Allergies  Allergen Reactions  . Beef-Derived Products Other (See Comments)    ACID REFLUX  . Other     FRIED FOODS UNSPECIFIED REACTION   . Chocolate Other (See Comments)    Acid Reflux  . Hydrocodone Other (See Comments)    INSOMNIA HYPERACTIVITY  . Ibuprofen Rash and Other (See Comments)    Causes mouth sores  . Niacin And Related Other (See Comments)    Flushing, burning, redness > REACTIONS NOT ALLERGIC   . Tramadol Itching    Chief Complaint  Patient presents with  . Discharge Note    HPI:  She is being discharged to home with home health for pt/rn. She will need a front wheel walker and 3:1 commode. She will need her prescriptions written and will need to follow up with her medical provider.  She had been hospitalized or a left hip fracture. She was admitted to this facility for short term rehab. She is now ready to return back home. She will be continuing radiation therapy for her lung cancer.    Past Medical History:  Diagnosis Date  . Adenocarcinoma ([place])    recurrent/notes 07/03/2017  . Blind left eye 10/2013   cva  . Breast cancer ([place]) 05/2014   ER+/PR+/Her2-     LUNG CANCER  . Bundle branch block left   . Chest pain   . Family history of bladder cancer   . Family history of breast cancer   . Family history of prostate cancer   . GERD (gastroesophageal reflux disease)   . History of blood transfusion    age 18  . Hyperlipidemia   . Hypertension   . Hypothyroid   . Lung nodule   . Melanoma in situ of face ([place]) JUNE 2015   LEFT CHEEK  . Meningioma ([place])    brain lining  . Migraine    reports only having one  . Osteoarthritis   . Personal history of radiation therapy   . Radiation 09/16/14-10/07/14   Right  Breast  21 fractions  . Skin cancer   . Stroke [facility]) 2015   Blind in left eye as a result    Past Surgical History:    Procedure Laterality Date  . ABDOMINAL HYSTERECTOMY  ~ 1997  . APPENDECTOMY     age 89  . BILATERAL SALPINGOOPHORECTOMY    . BLADDER REPAIR  2009   cystocele  . BRAIN MENINGIOMA EXCISION     Gamma knife to Meningioma in brain lining  . BREAST BIOPSY    . BREAST LUMPECTOMY Right   . BUNIONECTOMY     bilateral great toe  . CARDIAC CATHETERIZATION  2004  . CHOLECYSTECTOMY  ~ 1997  . COLONOSCOPY    . CRYO INTERCOSTAL NERVE BLOCK Left 11/25/2014   Procedure: CRYO INTERCOSTAL NERVE BLOCK;  Surgeon: [name], MD;  Location: [place];  Service: Thoracic;  Laterality: Left;  . CT RADIATION THERAPY GUIDE     Gamma radiation -lt frontal-[facility]  . DILATION AND CURETTAGE OF UTERUS     X 2  . HIP ARTHROPLASTY Left 07/04/2017   Procedure: ARTHROPLASTY BIPOLAR HIP (HEMIARTHROPLASTY);  Surgeon: Tania Ade, MD;  Location: [place];  Service: Orthopedics;  Laterality: Left;  Marland Kitchen MELANOMA EXCISION Left 11/05/23, 11/30/13   CHEEK  . SEGMENTECOMY Left 11/25/2014   Procedure: LEFT LOWER LOBE  SEGMENTECTOMY;  Surgeon: [name], MD;  Location: [place];  Service: Thoracic;  Laterality: Left;  Marland Kitchen VIDEO ASSISTED THORACOSCOPY Left 11/25/2014   Procedure: VIDEO ASSISTED THORACOSCOPY;  Surgeon: [name], MD;  Location: [place];  Service: Thoracic;  Laterality: Left;  Marland Kitchen VIDEO BRONCHOSCOPY WITH ENDOBRONCHIAL NAVIGATION N/A 06/19/2017   Procedure: VIDEO BRONCHOSCOPY;  Surgeon: [name], MD;  Location: [place];  Service: Thoracic;  Laterality: N/A;  . VIDEO BRONCHOSCOPY WITH ENDOBRONCHIAL ULTRASOUND N/A 11/25/2014   Procedure: VIDEO BRONCHOSCOPY WITH ENDOBRONCHIAL ULTRASOUND;  Surgeon: [name], MD;  Location: [place];  Service: Thoracic;  Laterality: N/A;    Social History   Socioeconomic History  . Marital status: Married    Spouse name: Not on file  . Number of children: 4  . Years of education: Not on file  . Highest education level: Not on file  Social Needs   . Financial resource strain: Not on file  . Food insecurity - worry: Not on file  . Food insecurity - inability: Not on file  . Transportation needs - medical: Not on file  . Transportation needs - non-medical: Not on file  Occupational History  . Not on file  Tobacco Use  . Smoking status: Never Smoker  . Smokeless tobacco: Never Used  Substance and Sexual Activity  . Alcohol use: No  . Drug use: No  . Sexual activity: Not on file  Other Topics Concern  . Not on file  Social History Narrative  . Not on file   Family History  Problem Relation Age of Onset  . CAD Mother        CABG  . Hyperlipidemia Mother   . AAA (abdominal aortic aneurysm) Mother   . Renal Disease Father   . Kidney disease Father   . CAD Father   . Cancer Father        PROSTATE  . Diabetes Brother   . Hyperlipidemia Brother   . Breast cancer Daughter 27  . Breast cancer Sister 60  . Breast cancer Sister 46  . Cancer Brother        NOS  . Bladder Cancer Brother 61  . Lung cancer Cousin        3 paternal cousins with lung cancer  . Breast cancer Cousin        five paternal first cousins  . Cancer Cousin        4 paternal first cousins with Cancer NOS  . Cancer Cousin        1 paternal cousin with oral cancer (tongue)    VITAL SIGNS BP 136/77   Pulse 63   Temp 98 F (36.7 C)   Resp 18   Ht _0  (1.676 m)   Wt 135 lb (61.2 kg)   SpO2 96%   BMI 21.79 kg/m   Patient's Medications  New Prescriptions   No medications on file  Previous Medications   ALENDRONATE (FOSAMAX) 70 MG TABLET    Take 70 mg by mouth once a week. Pt takes medication before breakfast with full glass of water. Pt sits upright for 30 minutes after taking medicine. Do not  lie down or recline for at least 30 minutes after taking medication ON SUNDAYS   ATENOLOL (TENORMIN) 50 MG TABLET    Take 50 mg by mouth at bedtime.    B COMPLEX VITAMINS CAPSULE    Take 1 capsule by mouth daily.   CALCIUM-VITAMIN D (OSCAL WITH D)  500-200 MG-UNIT PER TABLET    Take 1 tablet by mouth at bedtime.    CHOLECALCIFEROL (VITAMIN D) 2000 UNITS TABLET    Take 2,000 Units by mouth daily.   DIGESTIVE ENZYMES (ENZYME DIGEST PO)    Take 5 mLs by mouth 3 (three) times daily with meals.   ENOXAPARIN (LOVENOX) 40 MG/0.4ML INJECTION    Inject 0.4 mLs (40 mg total) into the skin daily for 19 days.   FISH OIL-OMEGA-3 FATTY ACIDS 1000 MG CAPSULE    Take 1 g by mouth daily.    HYDROCODONE-ACETAMINOPHEN (NORCO/VICODIN) 5-325 MG TABLET    Take 1-2 tablets by mouth every 6 (six) hours as needed for moderate pain.   LEVOTHYROXINE (SYNTHROID, LEVOTHROID) 112 MCG TABLET    Take 112 mcg by mouth daily before breakfast.    LISINOPRIL (PRINIVIL,ZESTRIL) 10 MG TABLET    Take 1 tablet (10 mg total) by mouth daily.   MULTIPLE VITAMIN (MULTIVITAMIN) TABLET    Take 1 tablet by mouth every morning.    PANTOPRAZOLE (PROTONIX) 40 MG TABLET    Take 1 tablet (40 mg total) by mouth daily.   POLYVINYL ALCOHOL-POVIDONE (REFRESH OP)    Apply 1 drop to eye as needed (DRY EYES).    SENNA (SENOKOT) 8.6 MG TABS TABLET    Take 1 tablet (8.6 mg total) by mouth 2 (two) times daily.   TRIAMCINOLONE ACETONIDE (NASACORT AQ NA)    Place 1 spray into the nose at bedtime as needed (CONGESTION).   VITAMIN E (E-400) 400 UNIT CAPSULE    Take 400 Units by mouth every Monday, Wednesday, and Friday. Mon Wed Fri  Modified Medications   No medications on file  Discontinued Medications   No medications on file     SIGNIFICANT DIAGNOSTIC EXAMS   PREVIOUS:   07-03-17: chest x-ray: No active cardiopulmonary disease.Stable chest.   07-03-17: left hip x-ray: Acute left femoral neck fracture with varus impaction.   07-04-17: pelvic x-ray: Status post left hip replacement. No acute finding.  NO NEW EXAMS     LABS REVIEWED PREVIOUS;   07-03-17: wbc 8.0; hgb 12.3; hct 36.6; mcv 86.7; plt 157; glucose 95; bun 16; creat 0.56 k+ 3.8; na++ 140; ca 9.3; mag 1.9' vit D 40; tsh  2.051 07-06-17: wbc 7.3; hgb 10.4; hct 31.4; mcv 87.0; plt 137; glucose 99; bun 8; creat 0.55; k+ 3.7; na++ 136; ca 7.9  NO NEW LABS    Review of Systems  Constitutional: Negative for malaise/fatigue.  Respiratory: Negative for cough and shortness of breath.   Cardiovascular: Negative for chest pain, palpitations and leg swelling.  Gastrointestinal: Negative for abdominal pain, constipation and heartburn.  Musculoskeletal: Negative for back pain, joint pain and myalgias.  Skin: Negative.   Neurological: Negative for dizziness.  Psychiatric/Behavioral: The patient is not nervous/anxious.       Physical Exam  Constitutional: She is oriented to person, place, and time. She appears well-developed and well-nourished. No distress.  Thin   Neck: No thyromegaly present.  Cardiovascular: Normal rate, regular rhythm, normal heart sounds and intact distal pulses.  Pulmonary/Chest: Effort normal and breath sounds normal. No respiratory distress.  Abdominal: Soft. Bowel sounds are normal. She exhibits no distension. There is no tenderness.  Musculoskeletal: She exhibits no edema.  Is able to move all extremities Is status post left hip fracture.   Lymphadenopathy:    She has no cervical adenopathy.  Neurological: She is alert and oriented to person, place, and time.  Skin: Skin  is warm and dry. She is not diaphoretic.  Left hip incision line without signs of infection  Stage II: right medial buttock: 1.0 x 0.5 cm Stage II left medial buttock: 2.0 x 0.6 cm   Psychiatric: She has a normal mood and affect.   ASSESSMENT/ PLAN:  Patient is being discharged with the following home health services:  Pt/rn: to evaluate and treat as indicated for gait and strength; medication management and wound management   Patient is being discharged with the following durable medical equipment:  Front wheel walker to allow her to maintain her current level of independence with her adls. 3:1 commode   Patient has  been advised to f/u with their PCP in 1-2 weeks to bring them up to date on their rehab stay.  Social services at facility was responsible for arranging this appointment.  Pt was provided with a 30 day supply of prescriptions for medications and refills must be obtained from their PCP.  For controlled substances, a more limited supply may be provided adequate until PCP appointment only.  30 day supply of her prescription medications per the list above #20 vicodin 5/325 mg tabs   Time spent with patient 40 minutes: discussed home health needs; expectations and goals. dme needed and medications; has verbalized understanding.    Ok Edwards NP [facility]  Contact [phone] Monday through Friday 8am- 5pm  After hours call [phone]

## 2017-07-16 ENCOUNTER — Ambulatory Visit
Admission: RE | Admit: 2017-07-16 | Discharge: 2017-07-16 | Disposition: A | Discharge status: Home / Self Care | Payer: Medicare HMO | Source: Ambulatory Visit | Attending: Radiation Oncology | Admitting: Radiation Oncology

## 2017-07-16 DIAGNOSIS — K219 Gastro-esophageal reflux disease without esophagitis: Secondary | ICD-10-CM | POA: Diagnosis not present

## 2017-07-16 DIAGNOSIS — D320 Benign neoplasm of cerebral meninges: Secondary | ICD-10-CM | POA: Diagnosis not present

## 2017-07-16 DIAGNOSIS — M1990 Unspecified osteoarthritis, unspecified site: Secondary | ICD-10-CM | POA: Diagnosis not present

## 2017-07-16 DIAGNOSIS — Z9181 History of falling: Secondary | ICD-10-CM | POA: Diagnosis not present

## 2017-07-16 DIAGNOSIS — Z923 Personal history of irradiation: Secondary | ICD-10-CM | POA: Diagnosis not present

## 2017-07-16 DIAGNOSIS — R2689 Other abnormalities of gait and mobility: Secondary | ICD-10-CM | POA: Diagnosis not present

## 2017-07-16 DIAGNOSIS — E785 Hyperlipidemia, unspecified: Secondary | ICD-10-CM | POA: Diagnosis not present

## 2017-07-16 DIAGNOSIS — I10 Essential (primary) hypertension: Secondary | ICD-10-CM | POA: Diagnosis not present

## 2017-07-16 DIAGNOSIS — Z510 Encounter for antineoplastic radiation therapy: Secondary | ICD-10-CM | POA: Diagnosis not present

## 2017-07-16 DIAGNOSIS — M25552 Pain in left hip: Secondary | ICD-10-CM | POA: Diagnosis not present

## 2017-07-16 DIAGNOSIS — R918 Other nonspecific abnormal finding of lung field: Secondary | ICD-10-CM | POA: Diagnosis not present

## 2017-07-16 DIAGNOSIS — C3432 Malignant neoplasm of lower lobe, left bronchus or lung: Secondary | ICD-10-CM | POA: Diagnosis not present

## 2017-07-16 DIAGNOSIS — E039 Hypothyroidism, unspecified: Secondary | ICD-10-CM | POA: Diagnosis not present

## 2017-07-17 ENCOUNTER — Ambulatory Visit
Admission: RE | Admit: 2017-07-17 | Discharge: 2017-07-17 | Disposition: A | Discharge status: Home / Self Care | Payer: Medicare HMO | Source: Ambulatory Visit | Attending: Radiation Oncology | Admitting: Radiation Oncology

## 2017-07-17 DIAGNOSIS — R918 Other nonspecific abnormal finding of lung field: Secondary | ICD-10-CM | POA: Diagnosis not present

## 2017-07-17 DIAGNOSIS — H3412 Central retinal artery occlusion, left eye: Secondary | ICD-10-CM | POA: Diagnosis not present

## 2017-07-17 DIAGNOSIS — D320 Benign neoplasm of cerebral meninges: Secondary | ICD-10-CM | POA: Diagnosis not present

## 2017-07-17 DIAGNOSIS — H5442A3 Blindness left eye category 3, normal vision right eye: Secondary | ICD-10-CM | POA: Diagnosis not present

## 2017-07-17 DIAGNOSIS — M1990 Unspecified osteoarthritis, unspecified site: Secondary | ICD-10-CM | POA: Diagnosis not present

## 2017-07-17 DIAGNOSIS — M150 Primary generalized (osteo)arthritis: Secondary | ICD-10-CM | POA: Diagnosis not present

## 2017-07-17 DIAGNOSIS — C3432 Malignant neoplasm of lower lobe, left bronchus or lung: Secondary | ICD-10-CM | POA: Diagnosis not present

## 2017-07-17 DIAGNOSIS — C50411 Malignant neoplasm of upper-outer quadrant of right female breast: Secondary | ICD-10-CM | POA: Diagnosis not present

## 2017-07-17 DIAGNOSIS — E039 Hypothyroidism, unspecified: Secondary | ICD-10-CM | POA: Diagnosis not present

## 2017-07-17 DIAGNOSIS — L89311 Pressure ulcer of right buttock, stage 1: Secondary | ICD-10-CM | POA: Diagnosis not present

## 2017-07-17 DIAGNOSIS — I10 Essential (primary) hypertension: Secondary | ICD-10-CM | POA: Diagnosis not present

## 2017-07-17 DIAGNOSIS — L89321 Pressure ulcer of left buttock, stage 1: Secondary | ICD-10-CM | POA: Diagnosis not present

## 2017-07-17 DIAGNOSIS — S72002D Fracture of unspecified part of neck of left femur, subsequent encounter for closed fracture with routine healing: Secondary | ICD-10-CM | POA: Diagnosis not present

## 2017-07-17 DIAGNOSIS — Z923 Personal history of irradiation: Secondary | ICD-10-CM | POA: Diagnosis not present

## 2017-07-17 DIAGNOSIS — Z510 Encounter for antineoplastic radiation therapy: Secondary | ICD-10-CM | POA: Diagnosis not present

## 2017-07-17 DIAGNOSIS — E785 Hyperlipidemia, unspecified: Secondary | ICD-10-CM | POA: Diagnosis not present

## 2017-07-17 DIAGNOSIS — K219 Gastro-esophageal reflux disease without esophagitis: Secondary | ICD-10-CM | POA: Diagnosis not present

## 2017-07-18 ENCOUNTER — Ambulatory Visit
Admission: RE | Admit: 2017-07-18 | Discharge: 2017-07-18 | Disposition: A | Discharge status: Home / Self Care | Payer: Medicare HMO | Source: Ambulatory Visit | Attending: Radiation Oncology | Admitting: Radiation Oncology

## 2017-07-18 DIAGNOSIS — K219 Gastro-esophageal reflux disease without esophagitis: Secondary | ICD-10-CM | POA: Diagnosis not present

## 2017-07-18 DIAGNOSIS — E785 Hyperlipidemia, unspecified: Secondary | ICD-10-CM | POA: Diagnosis not present

## 2017-07-18 DIAGNOSIS — C3432 Malignant neoplasm of lower lobe, left bronchus or lung: Secondary | ICD-10-CM | POA: Diagnosis not present

## 2017-07-18 DIAGNOSIS — R918 Other nonspecific abnormal finding of lung field: Secondary | ICD-10-CM | POA: Diagnosis not present

## 2017-07-18 DIAGNOSIS — M1990 Unspecified osteoarthritis, unspecified site: Secondary | ICD-10-CM | POA: Diagnosis not present

## 2017-07-18 DIAGNOSIS — D320 Benign neoplasm of cerebral meninges: Secondary | ICD-10-CM | POA: Diagnosis not present

## 2017-07-18 DIAGNOSIS — Z923 Personal history of irradiation: Secondary | ICD-10-CM | POA: Diagnosis not present

## 2017-07-18 DIAGNOSIS — I10 Essential (primary) hypertension: Secondary | ICD-10-CM | POA: Diagnosis not present

## 2017-07-18 DIAGNOSIS — E039 Hypothyroidism, unspecified: Secondary | ICD-10-CM | POA: Diagnosis not present

## 2017-07-18 DIAGNOSIS — Z510 Encounter for antineoplastic radiation therapy: Secondary | ICD-10-CM | POA: Diagnosis not present

## 2017-07-19 ENCOUNTER — Ambulatory Visit
Admission: RE | Admit: 2017-07-19 | Discharge: 2017-07-19 | Disposition: A | Discharge status: Home / Self Care | Payer: Medicare HMO | Source: Ambulatory Visit | Attending: Radiation Oncology | Admitting: Radiation Oncology

## 2017-07-19 DIAGNOSIS — E785 Hyperlipidemia, unspecified: Secondary | ICD-10-CM | POA: Diagnosis not present

## 2017-07-19 DIAGNOSIS — Z923 Personal history of irradiation: Secondary | ICD-10-CM | POA: Diagnosis not present

## 2017-07-19 DIAGNOSIS — C3432 Malignant neoplasm of lower lobe, left bronchus or lung: Secondary | ICD-10-CM | POA: Diagnosis not present

## 2017-07-19 DIAGNOSIS — K219 Gastro-esophageal reflux disease without esophagitis: Secondary | ICD-10-CM | POA: Diagnosis not present

## 2017-07-19 DIAGNOSIS — M1990 Unspecified osteoarthritis, unspecified site: Secondary | ICD-10-CM | POA: Diagnosis not present

## 2017-07-19 DIAGNOSIS — R918 Other nonspecific abnormal finding of lung field: Secondary | ICD-10-CM | POA: Diagnosis not present

## 2017-07-19 DIAGNOSIS — E039 Hypothyroidism, unspecified: Secondary | ICD-10-CM | POA: Diagnosis not present

## 2017-07-19 DIAGNOSIS — I10 Essential (primary) hypertension: Secondary | ICD-10-CM | POA: Diagnosis not present

## 2017-07-19 DIAGNOSIS — D320 Benign neoplasm of cerebral meninges: Secondary | ICD-10-CM | POA: Diagnosis not present

## 2017-07-19 DIAGNOSIS — Z510 Encounter for antineoplastic radiation therapy: Secondary | ICD-10-CM | POA: Diagnosis not present

## 2017-07-19 DIAGNOSIS — S72092D Other fracture of head and neck of left femur, subsequent encounter for closed fracture with routine healing: Secondary | ICD-10-CM | POA: Diagnosis not present

## 2017-07-22 ENCOUNTER — Ambulatory Visit
Admission: RE | Admit: 2017-07-22 | Discharge: 2017-07-22 | Disposition: A | Discharge status: Home / Self Care | Payer: Medicare HMO | Source: Ambulatory Visit | Attending: Radiation Oncology | Admitting: Radiation Oncology

## 2017-07-22 ENCOUNTER — Other Ambulatory Visit: Payer: Self-pay | Admitting: *Deleted

## 2017-07-22 DIAGNOSIS — I10 Essential (primary) hypertension: Secondary | ICD-10-CM | POA: Diagnosis not present

## 2017-07-22 DIAGNOSIS — D320 Benign neoplasm of cerebral meninges: Secondary | ICD-10-CM | POA: Diagnosis not present

## 2017-07-22 DIAGNOSIS — Z510 Encounter for antineoplastic radiation therapy: Secondary | ICD-10-CM | POA: Diagnosis not present

## 2017-07-22 DIAGNOSIS — E785 Hyperlipidemia, unspecified: Secondary | ICD-10-CM | POA: Diagnosis not present

## 2017-07-22 DIAGNOSIS — R918 Other nonspecific abnormal finding of lung field: Secondary | ICD-10-CM

## 2017-07-22 DIAGNOSIS — C3432 Malignant neoplasm of lower lobe, left bronchus or lung: Secondary | ICD-10-CM | POA: Diagnosis not present

## 2017-07-22 DIAGNOSIS — E039 Hypothyroidism, unspecified: Secondary | ICD-10-CM | POA: Diagnosis not present

## 2017-07-22 DIAGNOSIS — Z923 Personal history of irradiation: Secondary | ICD-10-CM | POA: Diagnosis not present

## 2017-07-22 DIAGNOSIS — M1990 Unspecified osteoarthritis, unspecified site: Secondary | ICD-10-CM | POA: Diagnosis not present

## 2017-07-22 DIAGNOSIS — K219 Gastro-esophageal reflux disease without esophagitis: Secondary | ICD-10-CM | POA: Diagnosis not present

## 2017-07-23 ENCOUNTER — Encounter: Payer: Self-pay | Admitting: Radiation Oncology

## 2017-07-23 DIAGNOSIS — S72002D Fracture of unspecified part of neck of left femur, subsequent encounter for closed fracture with routine healing: Secondary | ICD-10-CM | POA: Diagnosis not present

## 2017-07-23 DIAGNOSIS — L89321 Pressure ulcer of left buttock, stage 1: Secondary | ICD-10-CM | POA: Diagnosis not present

## 2017-07-23 DIAGNOSIS — L89311 Pressure ulcer of right buttock, stage 1: Secondary | ICD-10-CM | POA: Diagnosis not present

## 2017-07-23 DIAGNOSIS — H3412 Central retinal artery occlusion, left eye: Secondary | ICD-10-CM | POA: Diagnosis not present

## 2017-07-23 DIAGNOSIS — C3432 Malignant neoplasm of lower lobe, left bronchus or lung: Secondary | ICD-10-CM | POA: Diagnosis not present

## 2017-07-23 DIAGNOSIS — I10 Essential (primary) hypertension: Secondary | ICD-10-CM | POA: Diagnosis not present

## 2017-07-23 DIAGNOSIS — C50411 Malignant neoplasm of upper-outer quadrant of right female breast: Secondary | ICD-10-CM | POA: Diagnosis not present

## 2017-07-23 DIAGNOSIS — M150 Primary generalized (osteo)arthritis: Secondary | ICD-10-CM | POA: Diagnosis not present

## 2017-07-23 DIAGNOSIS — H5442A3 Blindness left eye category 3, normal vision right eye: Secondary | ICD-10-CM | POA: Diagnosis not present

## 2017-07-23 DIAGNOSIS — K219 Gastro-esophageal reflux disease without esophagitis: Secondary | ICD-10-CM | POA: Diagnosis not present

## 2017-07-23 NOTE — Progress Notes (Signed)
  Radiation Oncology         ([phone]) [phone] ________________________________  Name: Melissa Snyder MRN: [id number]  Date: 07/23/2017  DOB: [birth year]  End of Treatment Note  Diagnosis: Recurrent Stage IA, pT1aN0 NSCLC, adenocarcinoma of the LLL.    Indication for treatment: Curative       Radiation treatment dates: 07/09/17-07/22/17  Site/dose:  Left lung/ 50 Gy in 10 fractions  Beams/energy:  IMRT/ 6X  Narrative: The patient tolerated radiation treatment relatively well. She denied pain, fatigue, or difficulty swallowing. Skin to the treatment area appears warm, dry, and normal in color.  Plan: The patient has completed radiation treatment. The patient will return to radiation oncology clinic for routine followup in one month. I advised them to call or return sooner if they have any questions or concerns related to their recovery or treatment.  ------------------------------------------------  Jodelle Gross, MD, PhD  This document serves as a record of services personally performed by Kyung Rudd, MD. It was created on his behalf by Bethann Humble, a trained medical scribe. The creation of this record is based on the scribe's personal observations and the provider's statements to them. This document has been checked and approved by the attending provider.

## 2017-07-24 DIAGNOSIS — I10 Essential (primary) hypertension: Secondary | ICD-10-CM | POA: Diagnosis not present

## 2017-07-24 DIAGNOSIS — H3412 Central retinal artery occlusion, left eye: Secondary | ICD-10-CM | POA: Diagnosis not present

## 2017-07-24 DIAGNOSIS — K219 Gastro-esophageal reflux disease without esophagitis: Secondary | ICD-10-CM | POA: Diagnosis not present

## 2017-07-24 DIAGNOSIS — L89321 Pressure ulcer of left buttock, stage 1: Secondary | ICD-10-CM | POA: Diagnosis not present

## 2017-07-24 DIAGNOSIS — C3432 Malignant neoplasm of lower lobe, left bronchus or lung: Secondary | ICD-10-CM | POA: Diagnosis not present

## 2017-07-24 DIAGNOSIS — S72002D Fracture of unspecified part of neck of left femur, subsequent encounter for closed fracture with routine healing: Secondary | ICD-10-CM | POA: Diagnosis not present

## 2017-07-24 DIAGNOSIS — L89311 Pressure ulcer of right buttock, stage 1: Secondary | ICD-10-CM | POA: Diagnosis not present

## 2017-07-24 DIAGNOSIS — C50411 Malignant neoplasm of upper-outer quadrant of right female breast: Secondary | ICD-10-CM | POA: Diagnosis not present

## 2017-07-24 DIAGNOSIS — M150 Primary generalized (osteo)arthritis: Secondary | ICD-10-CM | POA: Diagnosis not present

## 2017-07-24 DIAGNOSIS — H5442A3 Blindness left eye category 3, normal vision right eye: Secondary | ICD-10-CM | POA: Diagnosis not present

## 2017-07-26 DIAGNOSIS — C3432 Malignant neoplasm of lower lobe, left bronchus or lung: Secondary | ICD-10-CM | POA: Diagnosis not present

## 2017-07-26 DIAGNOSIS — L89311 Pressure ulcer of right buttock, stage 1: Secondary | ICD-10-CM | POA: Diagnosis not present

## 2017-07-26 DIAGNOSIS — S72002D Fracture of unspecified part of neck of left femur, subsequent encounter for closed fracture with routine healing: Secondary | ICD-10-CM | POA: Diagnosis not present

## 2017-07-26 DIAGNOSIS — H3412 Central retinal artery occlusion, left eye: Secondary | ICD-10-CM | POA: Diagnosis not present

## 2017-07-26 DIAGNOSIS — I10 Essential (primary) hypertension: Secondary | ICD-10-CM | POA: Diagnosis not present

## 2017-07-26 DIAGNOSIS — C50411 Malignant neoplasm of upper-outer quadrant of right female breast: Secondary | ICD-10-CM | POA: Diagnosis not present

## 2017-07-26 DIAGNOSIS — L89321 Pressure ulcer of left buttock, stage 1: Secondary | ICD-10-CM | POA: Diagnosis not present

## 2017-07-26 DIAGNOSIS — K219 Gastro-esophageal reflux disease without esophagitis: Secondary | ICD-10-CM | POA: Diagnosis not present

## 2017-07-26 DIAGNOSIS — M150 Primary generalized (osteo)arthritis: Secondary | ICD-10-CM | POA: Diagnosis not present

## 2017-07-26 DIAGNOSIS — H5442A3 Blindness left eye category 3, normal vision right eye: Secondary | ICD-10-CM | POA: Diagnosis not present

## 2017-07-29 DIAGNOSIS — S72002S Fracture of unspecified part of neck of left femur, sequela: Secondary | ICD-10-CM | POA: Diagnosis not present

## 2017-07-29 DIAGNOSIS — I10 Essential (primary) hypertension: Secondary | ICD-10-CM | POA: Diagnosis not present

## 2017-07-29 DIAGNOSIS — M859 Disorder of bone density and structure, unspecified: Secondary | ICD-10-CM | POA: Diagnosis not present

## 2017-07-29 DIAGNOSIS — E7849 Other hyperlipidemia: Secondary | ICD-10-CM | POA: Diagnosis not present

## 2017-07-29 DIAGNOSIS — C3490 Malignant neoplasm of unspecified part of unspecified bronchus or lung: Secondary | ICD-10-CM | POA: Diagnosis not present

## 2017-07-29 DIAGNOSIS — E038 Other specified hypothyroidism: Secondary | ICD-10-CM | POA: Diagnosis not present

## 2017-07-29 DIAGNOSIS — Z6820 Body mass index (BMI) 20.0-20.9, adult: Secondary | ICD-10-CM | POA: Diagnosis not present

## 2017-07-30 DIAGNOSIS — K219 Gastro-esophageal reflux disease without esophagitis: Secondary | ICD-10-CM | POA: Diagnosis not present

## 2017-07-30 DIAGNOSIS — H3412 Central retinal artery occlusion, left eye: Secondary | ICD-10-CM | POA: Diagnosis not present

## 2017-07-30 DIAGNOSIS — M150 Primary generalized (osteo)arthritis: Secondary | ICD-10-CM | POA: Diagnosis not present

## 2017-07-30 DIAGNOSIS — L89311 Pressure ulcer of right buttock, stage 1: Secondary | ICD-10-CM | POA: Diagnosis not present

## 2017-07-30 DIAGNOSIS — S72002D Fracture of unspecified part of neck of left femur, subsequent encounter for closed fracture with routine healing: Secondary | ICD-10-CM | POA: Diagnosis not present

## 2017-07-30 DIAGNOSIS — I10 Essential (primary) hypertension: Secondary | ICD-10-CM | POA: Diagnosis not present

## 2017-07-30 DIAGNOSIS — L89321 Pressure ulcer of left buttock, stage 1: Secondary | ICD-10-CM | POA: Diagnosis not present

## 2017-07-30 DIAGNOSIS — C50411 Malignant neoplasm of upper-outer quadrant of right female breast: Secondary | ICD-10-CM | POA: Diagnosis not present

## 2017-07-30 DIAGNOSIS — C3432 Malignant neoplasm of lower lobe, left bronchus or lung: Secondary | ICD-10-CM | POA: Diagnosis not present

## 2017-07-30 DIAGNOSIS — H5442A3 Blindness left eye category 3, normal vision right eye: Secondary | ICD-10-CM | POA: Diagnosis not present

## 2017-07-31 DIAGNOSIS — I10 Essential (primary) hypertension: Secondary | ICD-10-CM | POA: Diagnosis not present

## 2017-07-31 DIAGNOSIS — H5442A3 Blindness left eye category 3, normal vision right eye: Secondary | ICD-10-CM | POA: Diagnosis not present

## 2017-07-31 DIAGNOSIS — M150 Primary generalized (osteo)arthritis: Secondary | ICD-10-CM | POA: Diagnosis not present

## 2017-07-31 DIAGNOSIS — L89311 Pressure ulcer of right buttock, stage 1: Secondary | ICD-10-CM | POA: Diagnosis not present

## 2017-07-31 DIAGNOSIS — H3412 Central retinal artery occlusion, left eye: Secondary | ICD-10-CM | POA: Diagnosis not present

## 2017-07-31 DIAGNOSIS — K219 Gastro-esophageal reflux disease without esophagitis: Secondary | ICD-10-CM | POA: Diagnosis not present

## 2017-07-31 DIAGNOSIS — S72002D Fracture of unspecified part of neck of left femur, subsequent encounter for closed fracture with routine healing: Secondary | ICD-10-CM | POA: Diagnosis not present

## 2017-07-31 DIAGNOSIS — L89321 Pressure ulcer of left buttock, stage 1: Secondary | ICD-10-CM | POA: Diagnosis not present

## 2017-07-31 DIAGNOSIS — C3432 Malignant neoplasm of lower lobe, left bronchus or lung: Secondary | ICD-10-CM | POA: Diagnosis not present

## 2017-07-31 DIAGNOSIS — C50411 Malignant neoplasm of upper-outer quadrant of right female breast: Secondary | ICD-10-CM | POA: Diagnosis not present

## 2017-08-01 NOTE — Progress Notes (Signed)
[facility] Radiation Oncology Simulation and Treatment Planning Note   Name:  Melissa Snyder MRN: [id number]   Date: 08/01/2017  DOB: Oct 18, 1933  Status:outpatient    DIAGNOSIS:    ICD-10-CM   1. Primary cancer of left lower lobe of lung (HCC) C34.32      CONSENT VERIFIED:yes   SET UP: Patient is setup supine   IMMOBILIZATION: The patient was immobilized using a Vac Loc bag/ blue bag and customized accuform device.   NARRATIVE:The patient was brought to the [facility].  Identity was confirmed.  All relevant records and images related to the planned course of therapy were reviewed.  Then, the patient was positioned in a stable reproducible clinical set-up for radiation therapy. Abdominal compression was applied by me.  4D CT images were obtained and reproducible breathing pattern was confirmed. Free breathing CT images were obtained.  Skin markings were placed.  The CT images were loaded into the planning software where the target and avoidance structures were contoured.  The radiation prescription was entered and confirmed.    TREATMENT PLANNING NOTE:  Treatment planning then occurred. I have requested : IMRT planning.  This is medically necessary due to the very close proximity to critical normal structures including the adjacent lung, major blood vessels including the aorta, and spinal cord.  Simulation is performed and dose volume histogram of the gross tumor volume, planning tumor volume and criticial normal structures including the spinal cord and lungs were analyzed and requested.  Special treatment procedure was performed due to high dose per fraction.  The patient will be monitored for increased risk of toxicity.  Daily imaging using cone beam CT will be used for target localization.  I anticipate that the patient will receive 50 Gy in 10 fractions to target volume. Further adjustments will be made based on the planning process is  necessary.  ------------------------------------------------  Jodelle Gross, MD, PhD

## 2017-08-02 DIAGNOSIS — I10 Essential (primary) hypertension: Secondary | ICD-10-CM | POA: Diagnosis not present

## 2017-08-02 DIAGNOSIS — L89321 Pressure ulcer of left buttock, stage 1: Secondary | ICD-10-CM | POA: Diagnosis not present

## 2017-08-02 DIAGNOSIS — M150 Primary generalized (osteo)arthritis: Secondary | ICD-10-CM | POA: Diagnosis not present

## 2017-08-02 DIAGNOSIS — H5442A3 Blindness left eye category 3, normal vision right eye: Secondary | ICD-10-CM | POA: Diagnosis not present

## 2017-08-02 DIAGNOSIS — K219 Gastro-esophageal reflux disease without esophagitis: Secondary | ICD-10-CM | POA: Diagnosis not present

## 2017-08-02 DIAGNOSIS — L89311 Pressure ulcer of right buttock, stage 1: Secondary | ICD-10-CM | POA: Diagnosis not present

## 2017-08-02 DIAGNOSIS — S72002D Fracture of unspecified part of neck of left femur, subsequent encounter for closed fracture with routine healing: Secondary | ICD-10-CM | POA: Diagnosis not present

## 2017-08-02 DIAGNOSIS — C50411 Malignant neoplasm of upper-outer quadrant of right female breast: Secondary | ICD-10-CM | POA: Diagnosis not present

## 2017-08-02 DIAGNOSIS — C3432 Malignant neoplasm of lower lobe, left bronchus or lung: Secondary | ICD-10-CM | POA: Diagnosis not present

## 2017-08-02 DIAGNOSIS — H3412 Central retinal artery occlusion, left eye: Secondary | ICD-10-CM | POA: Diagnosis not present

## 2017-08-05 DIAGNOSIS — C50411 Malignant neoplasm of upper-outer quadrant of right female breast: Secondary | ICD-10-CM | POA: Diagnosis not present

## 2017-08-05 DIAGNOSIS — I10 Essential (primary) hypertension: Secondary | ICD-10-CM | POA: Diagnosis not present

## 2017-08-05 DIAGNOSIS — L89321 Pressure ulcer of left buttock, stage 1: Secondary | ICD-10-CM | POA: Diagnosis not present

## 2017-08-05 DIAGNOSIS — C3432 Malignant neoplasm of lower lobe, left bronchus or lung: Secondary | ICD-10-CM | POA: Diagnosis not present

## 2017-08-05 DIAGNOSIS — K219 Gastro-esophageal reflux disease without esophagitis: Secondary | ICD-10-CM | POA: Diagnosis not present

## 2017-08-05 DIAGNOSIS — H3412 Central retinal artery occlusion, left eye: Secondary | ICD-10-CM | POA: Diagnosis not present

## 2017-08-05 DIAGNOSIS — M150 Primary generalized (osteo)arthritis: Secondary | ICD-10-CM | POA: Diagnosis not present

## 2017-08-05 DIAGNOSIS — H5442A3 Blindness left eye category 3, normal vision right eye: Secondary | ICD-10-CM | POA: Diagnosis not present

## 2017-08-05 DIAGNOSIS — S72002D Fracture of unspecified part of neck of left femur, subsequent encounter for closed fracture with routine healing: Secondary | ICD-10-CM | POA: Diagnosis not present

## 2017-08-05 DIAGNOSIS — L89311 Pressure ulcer of right buttock, stage 1: Secondary | ICD-10-CM | POA: Diagnosis not present

## 2017-08-06 DIAGNOSIS — M150 Primary generalized (osteo)arthritis: Secondary | ICD-10-CM | POA: Diagnosis not present

## 2017-08-06 DIAGNOSIS — H5442A3 Blindness left eye category 3, normal vision right eye: Secondary | ICD-10-CM | POA: Diagnosis not present

## 2017-08-06 DIAGNOSIS — C50411 Malignant neoplasm of upper-outer quadrant of right female breast: Secondary | ICD-10-CM | POA: Diagnosis not present

## 2017-08-06 DIAGNOSIS — C3432 Malignant neoplasm of lower lobe, left bronchus or lung: Secondary | ICD-10-CM | POA: Diagnosis not present

## 2017-08-06 DIAGNOSIS — I10 Essential (primary) hypertension: Secondary | ICD-10-CM | POA: Diagnosis not present

## 2017-08-06 DIAGNOSIS — H3412 Central retinal artery occlusion, left eye: Secondary | ICD-10-CM | POA: Diagnosis not present

## 2017-08-06 DIAGNOSIS — K219 Gastro-esophageal reflux disease without esophagitis: Secondary | ICD-10-CM | POA: Diagnosis not present

## 2017-08-06 DIAGNOSIS — L89321 Pressure ulcer of left buttock, stage 1: Secondary | ICD-10-CM | POA: Diagnosis not present

## 2017-08-06 DIAGNOSIS — S72002D Fracture of unspecified part of neck of left femur, subsequent encounter for closed fracture with routine healing: Secondary | ICD-10-CM | POA: Diagnosis not present

## 2017-08-06 DIAGNOSIS — L89311 Pressure ulcer of right buttock, stage 1: Secondary | ICD-10-CM | POA: Diagnosis not present

## 2017-08-07 DIAGNOSIS — L89321 Pressure ulcer of left buttock, stage 1: Secondary | ICD-10-CM | POA: Diagnosis not present

## 2017-08-07 DIAGNOSIS — C3432 Malignant neoplasm of lower lobe, left bronchus or lung: Secondary | ICD-10-CM | POA: Diagnosis not present

## 2017-08-07 DIAGNOSIS — M150 Primary generalized (osteo)arthritis: Secondary | ICD-10-CM | POA: Diagnosis not present

## 2017-08-07 DIAGNOSIS — I10 Essential (primary) hypertension: Secondary | ICD-10-CM | POA: Diagnosis not present

## 2017-08-07 DIAGNOSIS — H5442A3 Blindness left eye category 3, normal vision right eye: Secondary | ICD-10-CM | POA: Diagnosis not present

## 2017-08-07 DIAGNOSIS — S72002D Fracture of unspecified part of neck of left femur, subsequent encounter for closed fracture with routine healing: Secondary | ICD-10-CM | POA: Diagnosis not present

## 2017-08-07 DIAGNOSIS — C50411 Malignant neoplasm of upper-outer quadrant of right female breast: Secondary | ICD-10-CM | POA: Diagnosis not present

## 2017-08-07 DIAGNOSIS — L89311 Pressure ulcer of right buttock, stage 1: Secondary | ICD-10-CM | POA: Diagnosis not present

## 2017-08-07 DIAGNOSIS — H3412 Central retinal artery occlusion, left eye: Secondary | ICD-10-CM | POA: Diagnosis not present

## 2017-08-07 DIAGNOSIS — K219 Gastro-esophageal reflux disease without esophagitis: Secondary | ICD-10-CM | POA: Diagnosis not present

## 2017-08-08 DIAGNOSIS — H5442A3 Blindness left eye category 3, normal vision right eye: Secondary | ICD-10-CM | POA: Diagnosis not present

## 2017-08-08 DIAGNOSIS — K219 Gastro-esophageal reflux disease without esophagitis: Secondary | ICD-10-CM | POA: Diagnosis not present

## 2017-08-08 DIAGNOSIS — H3412 Central retinal artery occlusion, left eye: Secondary | ICD-10-CM | POA: Diagnosis not present

## 2017-08-08 DIAGNOSIS — S72002D Fracture of unspecified part of neck of left femur, subsequent encounter for closed fracture with routine healing: Secondary | ICD-10-CM | POA: Diagnosis not present

## 2017-08-08 DIAGNOSIS — M150 Primary generalized (osteo)arthritis: Secondary | ICD-10-CM | POA: Diagnosis not present

## 2017-08-08 DIAGNOSIS — C3432 Malignant neoplasm of lower lobe, left bronchus or lung: Secondary | ICD-10-CM | POA: Diagnosis not present

## 2017-08-08 DIAGNOSIS — I10 Essential (primary) hypertension: Secondary | ICD-10-CM | POA: Diagnosis not present

## 2017-08-08 DIAGNOSIS — L89311 Pressure ulcer of right buttock, stage 1: Secondary | ICD-10-CM | POA: Diagnosis not present

## 2017-08-08 DIAGNOSIS — C50411 Malignant neoplasm of upper-outer quadrant of right female breast: Secondary | ICD-10-CM | POA: Diagnosis not present

## 2017-08-08 DIAGNOSIS — L89321 Pressure ulcer of left buttock, stage 1: Secondary | ICD-10-CM | POA: Diagnosis not present

## 2017-08-13 ENCOUNTER — Telehealth: Payer: Self-pay | Admitting: *Deleted

## 2017-08-13 DIAGNOSIS — M150 Primary generalized (osteo)arthritis: Secondary | ICD-10-CM | POA: Diagnosis not present

## 2017-08-13 DIAGNOSIS — I10 Essential (primary) hypertension: Secondary | ICD-10-CM | POA: Diagnosis not present

## 2017-08-13 DIAGNOSIS — H3412 Central retinal artery occlusion, left eye: Secondary | ICD-10-CM | POA: Diagnosis not present

## 2017-08-13 DIAGNOSIS — K219 Gastro-esophageal reflux disease without esophagitis: Secondary | ICD-10-CM | POA: Diagnosis not present

## 2017-08-13 DIAGNOSIS — L89311 Pressure ulcer of right buttock, stage 1: Secondary | ICD-10-CM | POA: Diagnosis not present

## 2017-08-13 DIAGNOSIS — C50411 Malignant neoplasm of upper-outer quadrant of right female breast: Secondary | ICD-10-CM | POA: Diagnosis not present

## 2017-08-13 DIAGNOSIS — C3432 Malignant neoplasm of lower lobe, left bronchus or lung: Secondary | ICD-10-CM | POA: Diagnosis not present

## 2017-08-13 DIAGNOSIS — L89321 Pressure ulcer of left buttock, stage 1: Secondary | ICD-10-CM | POA: Diagnosis not present

## 2017-08-13 DIAGNOSIS — S72002D Fracture of unspecified part of neck of left femur, subsequent encounter for closed fracture with routine healing: Secondary | ICD-10-CM | POA: Diagnosis not present

## 2017-08-13 DIAGNOSIS — H5442A3 Blindness left eye category 3, normal vision right eye: Secondary | ICD-10-CM | POA: Diagnosis not present

## 2017-08-13 NOTE — Telephone Encounter (Signed)
Oncology Nurse Navigator Documentation  Oncology Nurse Navigator Flowsheets 08/13/2017  Navigator Location [facility]  Navigator Encounter Type Telephone/I called to follow up with Mr. Lukasik. I am checking to see if she would like to be set up with pulmonary rehab.  I was unable to reach her but did leave a vm message for her to call me with my name and phone number.   Telephone Outgoing Call  Abnormal Finding Date 06/10/2017  Confirmed Diagnosis Date 06/19/2017  Multidisiplinary Clinic Date 06/27/2017  Treatment Initiated Date 07/02/2017  Patient Visit Type Follow-up  Treatment Phase Follow-up  Barriers/Navigation Needs Education  Education Other  Interventions Education  Education Method Verbal  Acuity Level 2  Acuity Level 2 Educational needs  Time Spent with Patient 15

## 2017-08-14 ENCOUNTER — Telehealth: Payer: Self-pay | Admitting: *Deleted

## 2017-08-14 NOTE — Telephone Encounter (Signed)
Oncology Nurse Navigator Documentation  Oncology Nurse Navigator Flowsheets 08/14/2017  Navigator Location [facility]  Navigator Encounter Type Telephone/I received a vm message from Ms. Boies. I called her back. I was unable to reach her and left vm message will call back.   Telephone Outgoing Call  Treatment Phase Follow-up  Acuity Level 1  Time Spent with Patient 15

## 2017-08-15 ENCOUNTER — Telehealth: Payer: Self-pay | Admitting: *Deleted

## 2017-08-15 ENCOUNTER — Encounter: Payer: Self-pay | Admitting: *Deleted

## 2017-08-15 DIAGNOSIS — L89311 Pressure ulcer of right buttock, stage 1: Secondary | ICD-10-CM | POA: Diagnosis not present

## 2017-08-15 DIAGNOSIS — C3432 Malignant neoplasm of lower lobe, left bronchus or lung: Secondary | ICD-10-CM | POA: Diagnosis not present

## 2017-08-15 DIAGNOSIS — C50411 Malignant neoplasm of upper-outer quadrant of right female breast: Secondary | ICD-10-CM | POA: Diagnosis not present

## 2017-08-15 DIAGNOSIS — H5442A3 Blindness left eye category 3, normal vision right eye: Secondary | ICD-10-CM | POA: Diagnosis not present

## 2017-08-15 DIAGNOSIS — M150 Primary generalized (osteo)arthritis: Secondary | ICD-10-CM | POA: Diagnosis not present

## 2017-08-15 DIAGNOSIS — I10 Essential (primary) hypertension: Secondary | ICD-10-CM | POA: Diagnosis not present

## 2017-08-15 DIAGNOSIS — S72002D Fracture of unspecified part of neck of left femur, subsequent encounter for closed fracture with routine healing: Secondary | ICD-10-CM | POA: Diagnosis not present

## 2017-08-15 DIAGNOSIS — H3412 Central retinal artery occlusion, left eye: Secondary | ICD-10-CM | POA: Diagnosis not present

## 2017-08-15 DIAGNOSIS — K219 Gastro-esophageal reflux disease without esophagitis: Secondary | ICD-10-CM | POA: Diagnosis not present

## 2017-08-15 DIAGNOSIS — L89321 Pressure ulcer of left buttock, stage 1: Secondary | ICD-10-CM | POA: Diagnosis not present

## 2017-08-15 NOTE — Progress Notes (Signed)
Oncology Nurse Navigator Documentation  Oncology Nurse Navigator Flowsheets 08/15/2017  Navigator Location [facility]  Navigator Encounter Type Telephone/I called Ms. Paolillo today. I was able to speak with her about pulmonary rehab. She is interested in being seen by them. I completed referral and will update pulmonary rehab team.   Telephone Outgoing Call;Incoming Call  Treatment Phase Follow-up  Barriers/Navigation Needs Coordination of Care  Interventions Referrals  Referrals Other  Acuity Level 2  Time Spent with Patient 30

## 2017-08-16 ENCOUNTER — Other Ambulatory Visit (HOSPITAL_COMMUNITY): Payer: Self-pay

## 2017-08-16 DIAGNOSIS — C3490 Malignant neoplasm of unspecified part of unspecified bronchus or lung: Secondary | ICD-10-CM

## 2017-08-19 DIAGNOSIS — H5442A3 Blindness left eye category 3, normal vision right eye: Secondary | ICD-10-CM | POA: Diagnosis not present

## 2017-08-19 DIAGNOSIS — L89321 Pressure ulcer of left buttock, stage 1: Secondary | ICD-10-CM | POA: Diagnosis not present

## 2017-08-19 DIAGNOSIS — S72002D Fracture of unspecified part of neck of left femur, subsequent encounter for closed fracture with routine healing: Secondary | ICD-10-CM | POA: Diagnosis not present

## 2017-08-19 DIAGNOSIS — I10 Essential (primary) hypertension: Secondary | ICD-10-CM | POA: Diagnosis not present

## 2017-08-19 DIAGNOSIS — C50411 Malignant neoplasm of upper-outer quadrant of right female breast: Secondary | ICD-10-CM | POA: Diagnosis not present

## 2017-08-19 DIAGNOSIS — H3412 Central retinal artery occlusion, left eye: Secondary | ICD-10-CM | POA: Diagnosis not present

## 2017-08-19 DIAGNOSIS — C3432 Malignant neoplasm of lower lobe, left bronchus or lung: Secondary | ICD-10-CM | POA: Diagnosis not present

## 2017-08-19 DIAGNOSIS — K219 Gastro-esophageal reflux disease without esophagitis: Secondary | ICD-10-CM | POA: Diagnosis not present

## 2017-08-19 DIAGNOSIS — L89311 Pressure ulcer of right buttock, stage 1: Secondary | ICD-10-CM | POA: Diagnosis not present

## 2017-08-19 DIAGNOSIS — M150 Primary generalized (osteo)arthritis: Secondary | ICD-10-CM | POA: Diagnosis not present

## 2017-08-20 ENCOUNTER — Ambulatory Visit
Admission: RE | Admit: 2017-08-20 | Discharge: 2017-08-20 | Disposition: A | Discharge status: Home / Self Care | Payer: Medicare HMO | Source: Ambulatory Visit | Attending: Thoracic Surgery (Cardiothoracic Vascular Surgery) | Admitting: Thoracic Surgery (Cardiothoracic Vascular Surgery)

## 2017-08-20 ENCOUNTER — Ambulatory Visit: Payer: Medicare HMO | Admitting: Thoracic Surgery (Cardiothoracic Vascular Surgery)

## 2017-08-20 ENCOUNTER — Telehealth: Payer: Self-pay | Admitting: Radiation Oncology

## 2017-08-20 VITALS — BP 140/56 | HR 70 | Resp 20 | Ht 66.0 in | Wt 127.0 lb

## 2017-08-20 DIAGNOSIS — C3492 Malignant neoplasm of unspecified part of left bronchus or lung: Secondary | ICD-10-CM | POA: Diagnosis not present

## 2017-08-20 DIAGNOSIS — R918 Other nonspecific abnormal finding of lung field: Secondary | ICD-10-CM

## 2017-08-20 DIAGNOSIS — C3432 Malignant neoplasm of lower lobe, left bronchus or lung: Secondary | ICD-10-CM | POA: Diagnosis not present

## 2017-08-20 NOTE — Telephone Encounter (Signed)
LM for pt to discuss results of CT and future plans for follow up.

## 2017-08-20 NOTE — Progress Notes (Signed)
[address]       [place] [address]             [phone]    HPI: Melissa Snyder returns today following radiation.  Melissa Snyder is an 84 year old woman with a history of a stage Ia non-small cell lung cancer of the left lower lobe.  She had a left lower lobe superior segmentectomy in June 2016.  She did not require adjuvant therapy.  More recently she developed soft tissue thickening around her previous staple line.  That increased over time.  It was mildly hypermetabolic on PET/CT.  I did a bronchoscopy and biopsy and there was recurrent malignancy.  She was treated with 50 Gy of radiation in 10 fractions by Dr. Lisbeth Renshaw.  That finished on February 19.  She has not seen him back since completing treatment.  She had a CT done today. Past Medical History:  Diagnosis Date  . Adenocarcinoma ([facility])    recurrent/notes 07/03/2017  . Blind left eye 10/2013   cva  . Breast cancer ([facility]) 05/2014   ER+/PR+/Her2-     LUNG CANCER  . Bundle branch block left   . Chest pain   . Family history of bladder cancer   . Family history of breast cancer   . Family history of prostate cancer   . GERD (gastroesophageal reflux disease)   . History of blood transfusion    age 65  . Hyperlipidemia   . Hypertension   . Hypothyroid   . Lung nodule   . Melanoma in situ of face ([facility]) JUNE 2015   LEFT CHEEK  . Meningioma ([facility])    brain lining  . Migraine    reports only having one  . Osteoarthritis   . Personal history of radiation therapy   . Radiation 09/16/14-10/07/14   Right  Breast  21 fractions  . Skin cancer   . Stroke [facility]) 2015   Blind in left eye as a result     Current Outpatient Medications  Medication Sig Dispense Refill  . alendronate (FOSAMAX) 70 MG tablet Take 70 mg by mouth once a week. Pt takes medication before breakfast with full glass of water. Pt sits upright for 30 minutes after taking medicine. Do not  lie down or recline for at least 30 minutes after taking  medication ON SUNDAYS    . atenolol (TENORMIN) 50 MG tablet Take 50 mg by mouth at bedtime.     Marland Kitchen b complex vitamins capsule Take 1 capsule by mouth daily.    . calcium-vitamin D (OSCAL WITH D) 500-200 MG-UNIT per tablet Take 1 tablet by mouth at bedtime.     . Cholecalciferol (VITAMIN D3) 1000 units CAPS Take by mouth daily.    . Digestive Enzymes (ENZYME DIGEST PO) Take 5 mLs by mouth 3 (three) times daily with meals.    . fish oil-omega-3 fatty acids 1000 MG capsule Take 1 g by mouth daily.     Marland Kitchen HYDROcodone-acetaminophen (NORCO/VICODIN) 5-325 MG tablet Take 1-2 tablets by mouth every 6 (six) hours as needed for moderate pain. 30 tablet 0  . levothyroxine (SYNTHROID, LEVOTHROID) 112 MCG tablet Take 112 mcg by mouth daily before breakfast.     . lisinopril (PRINIVIL,ZESTRIL) 10 MG tablet Take 1 tablet (10 mg total) by mouth daily. 30 tablet 1  . Multiple Vitamin (MULTIVITAMIN) tablet Take 1 tablet by mouth every morning.     . Polyvinyl Alcohol-Povidone (REFRESH OP) Apply 1 drop  to eye as needed (DRY EYES).     Marland Kitchen senna (SENOKOT) 8.6 MG TABS tablet Take 1 tablet (8.6 mg total) by mouth 2 (two) times daily. 120 each 0  . Triamcinolone Acetonide (NASACORT AQ NA) Place 1 spray into the nose at bedtime as needed (CONGESTION).    Marland Kitchen vitamin E (E-400) 400 UNIT capsule Take 400 Units by mouth every Monday, Wednesday, and Friday. Mon Wed Fri     No current facility-administered medications for this visit.     Physical Exam BP (!) 140/56   Pulse 70   Resp 20   Ht '5\' 6"'  (1.676 m)   Wt 127 lb (57.6 kg)   SpO2 98% Comment: RA  BMI 20.62 kg/m  84 year old woman in no acute distress Lungs diminished but equal breath sounds bilaterally  Diagnostic Tests: CT CHEST WITHOUT CONTRAST  TECHNIQUE: Multidetector CT imaging of the chest was performed following the standard protocol without IV contrast.  COMPARISON:  06/10/2016  FINDINGS: Cardiovascular: The heart is normal in size. No  pericardial effusion.  No evidence of thoracic aortic aneurysm. Atherosclerotic calcifications of the aortic arch.  Coronary atherosclerosis of the LAD and left circumflex.  Mediastinum/Nodes: Small mediastinal lymph nodes which do not meet pathologic CT size criteria.  Visualized thyroid is unremarkable.  Lungs/Pleura: Status post wedge resection in the superior segment left lower lobe. Bandlike soft tissue thickening along the suture line, now measuring 1.2 x 2.2 cm (series 5/image 59), previously 1.6 x 2.3 cm. Surrounding ground-glass opacity/radiation changes.  Biapical pleural-parenchymal scarring, right greater than left. Additional subpleural reticulation in the anterior right upper and middle lobes, likely reflecting radiation changes.  Again noted are scattered faint ground-glass nodules (for example, series 5/image 31 in the left upper lobe), unchanged.  No focal consolidation.  Trace loculated pleural effusion in the posterior left hemithorax.  No pneumothorax.  Upper Abdomen: Visualized upper abdomen is grossly unremarkable.  Musculoskeletal: Degenerative changes of the visualized thoracolumbar spine.  IMPRESSION: Status post left lower lobe wedge resection. Bandlike soft tissue thickening along the suture line measuring 12 x 22 mm, mildly decreased. Surrounding radiation changes.  Additional radiation changes in the anterior right upper lobe.  Stable ground-glass nodularity in the lungs bilaterally.  Aortic Atherosclerosis (ICD10-I70.0).   Electronically Signed   By: Julian Hy M.D.   On: 08/20/2017 10:03 I personally reviewed the CT images.  Findings as noted above.  Impression: Melissa Snyder is an 84 year old woman with a history of a stage Ia non-small cell carcinoma treated with a left lower lobe superior segmentectomy.  She developed a recurrence.  That was treated with 50 Gy of radiation by Dr. Lisbeth Renshaw.  She now is about a  month out from treatment.  She tolerated the radiation well.  She had a CT today for follow-up after her radiation.  I suspect that she is supposed to have seen Dr. Lisbeth Renshaw.  There are any surgical issues at this time.  I will have my office contact Dr. Ida Rogue office to arrange follow-up.  She will be starting pulmonary rehab soon.  Plan: Follow-up with Dr. Lisbeth Renshaw.  I will be happy to see her back if I can be of any assistance with her care  [name], MD [facility] [phone]

## 2017-08-21 DIAGNOSIS — S72092D Other fracture of head and neck of left femur, subsequent encounter for closed fracture with routine healing: Secondary | ICD-10-CM | POA: Diagnosis not present

## 2017-08-22 ENCOUNTER — Telehealth: Payer: Self-pay | Admitting: Radiation Oncology

## 2017-08-22 DIAGNOSIS — H3412 Central retinal artery occlusion, left eye: Secondary | ICD-10-CM | POA: Diagnosis not present

## 2017-08-22 DIAGNOSIS — S72002D Fracture of unspecified part of neck of left femur, subsequent encounter for closed fracture with routine healing: Secondary | ICD-10-CM | POA: Diagnosis not present

## 2017-08-22 DIAGNOSIS — C3432 Malignant neoplasm of lower lobe, left bronchus or lung: Secondary | ICD-10-CM | POA: Diagnosis not present

## 2017-08-22 DIAGNOSIS — L89311 Pressure ulcer of right buttock, stage 1: Secondary | ICD-10-CM | POA: Diagnosis not present

## 2017-08-22 DIAGNOSIS — M150 Primary generalized (osteo)arthritis: Secondary | ICD-10-CM | POA: Diagnosis not present

## 2017-08-22 DIAGNOSIS — L89321 Pressure ulcer of left buttock, stage 1: Secondary | ICD-10-CM | POA: Diagnosis not present

## 2017-08-22 DIAGNOSIS — K219 Gastro-esophageal reflux disease without esophagitis: Secondary | ICD-10-CM | POA: Diagnosis not present

## 2017-08-22 DIAGNOSIS — I10 Essential (primary) hypertension: Secondary | ICD-10-CM | POA: Diagnosis not present

## 2017-08-22 DIAGNOSIS — H5442A3 Blindness left eye category 3, normal vision right eye: Secondary | ICD-10-CM | POA: Diagnosis not present

## 2017-08-22 DIAGNOSIS — C50411 Malignant neoplasm of upper-outer quadrant of right female breast: Secondary | ICD-10-CM | POA: Diagnosis not present

## 2017-08-22 NOTE — Telephone Encounter (Signed)
I called and spoke with the patient regarding her CT that was ordered early we will plan to repeat in 6 months and have her follow up to review at that time.

## 2017-08-23 ENCOUNTER — Telehealth (HOSPITAL_COMMUNITY): Payer: Self-pay

## 2017-08-23 NOTE — Telephone Encounter (Signed)
Patients insurance is active and benefits verified through [facility] - $30.00 co-pay, no deductible, out of pocket amount of $4,200/$2,548.38 has been met, no co-insurance, and no pre-authorization is required. Spoke with [employer] - Reference [phone]

## 2017-08-23 NOTE — Telephone Encounter (Signed)
Patient called to inform me that she will be done with HHPT next week. Scheduled orientation on 09/20/2017 at 9:30am. Patient will attend the 1:30pm exc class. Mailed packet.

## 2017-08-26 DIAGNOSIS — L89321 Pressure ulcer of left buttock, stage 1: Secondary | ICD-10-CM | POA: Diagnosis not present

## 2017-08-26 DIAGNOSIS — C3432 Malignant neoplasm of lower lobe, left bronchus or lung: Secondary | ICD-10-CM | POA: Diagnosis not present

## 2017-08-26 DIAGNOSIS — H5442A3 Blindness left eye category 3, normal vision right eye: Secondary | ICD-10-CM | POA: Diagnosis not present

## 2017-08-26 DIAGNOSIS — S72002D Fracture of unspecified part of neck of left femur, subsequent encounter for closed fracture with routine healing: Secondary | ICD-10-CM | POA: Diagnosis not present

## 2017-08-26 DIAGNOSIS — L89311 Pressure ulcer of right buttock, stage 1: Secondary | ICD-10-CM | POA: Diagnosis not present

## 2017-08-26 DIAGNOSIS — I10 Essential (primary) hypertension: Secondary | ICD-10-CM | POA: Diagnosis not present

## 2017-08-26 DIAGNOSIS — M150 Primary generalized (osteo)arthritis: Secondary | ICD-10-CM | POA: Diagnosis not present

## 2017-08-26 DIAGNOSIS — C50411 Malignant neoplasm of upper-outer quadrant of right female breast: Secondary | ICD-10-CM | POA: Diagnosis not present

## 2017-08-26 DIAGNOSIS — H3412 Central retinal artery occlusion, left eye: Secondary | ICD-10-CM | POA: Diagnosis not present

## 2017-08-26 DIAGNOSIS — K219 Gastro-esophageal reflux disease without esophagitis: Secondary | ICD-10-CM | POA: Diagnosis not present

## 2017-08-28 DIAGNOSIS — D229 Melanocytic nevi, unspecified: Secondary | ICD-10-CM | POA: Diagnosis not present

## 2017-08-28 DIAGNOSIS — D1801 Hemangioma of skin and subcutaneous tissue: Secondary | ICD-10-CM | POA: Diagnosis not present

## 2017-08-28 DIAGNOSIS — L814 Other melanin hyperpigmentation: Secondary | ICD-10-CM | POA: Diagnosis not present

## 2017-08-28 DIAGNOSIS — L821 Other seborrheic keratosis: Secondary | ICD-10-CM | POA: Diagnosis not present

## 2017-08-28 DIAGNOSIS — Z85820 Personal history of malignant melanoma of skin: Secondary | ICD-10-CM | POA: Diagnosis not present

## 2017-08-29 DIAGNOSIS — H5442A3 Blindness left eye category 3, normal vision right eye: Secondary | ICD-10-CM | POA: Diagnosis not present

## 2017-08-29 DIAGNOSIS — K219 Gastro-esophageal reflux disease without esophagitis: Secondary | ICD-10-CM | POA: Diagnosis not present

## 2017-08-29 DIAGNOSIS — L89321 Pressure ulcer of left buttock, stage 1: Secondary | ICD-10-CM | POA: Diagnosis not present

## 2017-08-29 DIAGNOSIS — I10 Essential (primary) hypertension: Secondary | ICD-10-CM | POA: Diagnosis not present

## 2017-08-29 DIAGNOSIS — H3412 Central retinal artery occlusion, left eye: Secondary | ICD-10-CM | POA: Diagnosis not present

## 2017-08-29 DIAGNOSIS — S72002D Fracture of unspecified part of neck of left femur, subsequent encounter for closed fracture with routine healing: Secondary | ICD-10-CM | POA: Diagnosis not present

## 2017-08-29 DIAGNOSIS — C3432 Malignant neoplasm of lower lobe, left bronchus or lung: Secondary | ICD-10-CM | POA: Diagnosis not present

## 2017-08-29 DIAGNOSIS — C50411 Malignant neoplasm of upper-outer quadrant of right female breast: Secondary | ICD-10-CM | POA: Diagnosis not present

## 2017-08-29 DIAGNOSIS — M150 Primary generalized (osteo)arthritis: Secondary | ICD-10-CM | POA: Diagnosis not present

## 2017-08-29 DIAGNOSIS — L89311 Pressure ulcer of right buttock, stage 1: Secondary | ICD-10-CM | POA: Diagnosis not present

## 2017-09-02 DIAGNOSIS — I10 Essential (primary) hypertension: Secondary | ICD-10-CM | POA: Diagnosis not present

## 2017-09-02 DIAGNOSIS — L89321 Pressure ulcer of left buttock, stage 1: Secondary | ICD-10-CM | POA: Diagnosis not present

## 2017-09-02 DIAGNOSIS — L89311 Pressure ulcer of right buttock, stage 1: Secondary | ICD-10-CM | POA: Diagnosis not present

## 2017-09-02 DIAGNOSIS — C50411 Malignant neoplasm of upper-outer quadrant of right female breast: Secondary | ICD-10-CM | POA: Diagnosis not present

## 2017-09-02 DIAGNOSIS — S72002D Fracture of unspecified part of neck of left femur, subsequent encounter for closed fracture with routine healing: Secondary | ICD-10-CM | POA: Diagnosis not present

## 2017-09-02 DIAGNOSIS — C3432 Malignant neoplasm of lower lobe, left bronchus or lung: Secondary | ICD-10-CM | POA: Diagnosis not present

## 2017-09-02 DIAGNOSIS — H3412 Central retinal artery occlusion, left eye: Secondary | ICD-10-CM | POA: Diagnosis not present

## 2017-09-02 DIAGNOSIS — M150 Primary generalized (osteo)arthritis: Secondary | ICD-10-CM | POA: Diagnosis not present

## 2017-09-02 DIAGNOSIS — K219 Gastro-esophageal reflux disease without esophagitis: Secondary | ICD-10-CM | POA: Diagnosis not present

## 2017-09-02 DIAGNOSIS — H5442A3 Blindness left eye category 3, normal vision right eye: Secondary | ICD-10-CM | POA: Diagnosis not present

## 2017-09-05 DIAGNOSIS — K219 Gastro-esophageal reflux disease without esophagitis: Secondary | ICD-10-CM | POA: Diagnosis not present

## 2017-09-05 DIAGNOSIS — L89321 Pressure ulcer of left buttock, stage 1: Secondary | ICD-10-CM | POA: Diagnosis not present

## 2017-09-05 DIAGNOSIS — M150 Primary generalized (osteo)arthritis: Secondary | ICD-10-CM | POA: Diagnosis not present

## 2017-09-05 DIAGNOSIS — L89311 Pressure ulcer of right buttock, stage 1: Secondary | ICD-10-CM | POA: Diagnosis not present

## 2017-09-05 DIAGNOSIS — S72002D Fracture of unspecified part of neck of left femur, subsequent encounter for closed fracture with routine healing: Secondary | ICD-10-CM | POA: Diagnosis not present

## 2017-09-05 DIAGNOSIS — C50411 Malignant neoplasm of upper-outer quadrant of right female breast: Secondary | ICD-10-CM | POA: Diagnosis not present

## 2017-09-05 DIAGNOSIS — I10 Essential (primary) hypertension: Secondary | ICD-10-CM | POA: Diagnosis not present

## 2017-09-05 DIAGNOSIS — C3432 Malignant neoplasm of lower lobe, left bronchus or lung: Secondary | ICD-10-CM | POA: Diagnosis not present

## 2017-09-05 DIAGNOSIS — H3412 Central retinal artery occlusion, left eye: Secondary | ICD-10-CM | POA: Diagnosis not present

## 2017-09-05 DIAGNOSIS — H5442A3 Blindness left eye category 3, normal vision right eye: Secondary | ICD-10-CM | POA: Diagnosis not present

## 2017-09-11 DIAGNOSIS — I10 Essential (primary) hypertension: Secondary | ICD-10-CM | POA: Diagnosis not present

## 2017-09-11 DIAGNOSIS — Z6820 Body mass index (BMI) 20.0-20.9, adult: Secondary | ICD-10-CM | POA: Diagnosis not present

## 2017-09-11 DIAGNOSIS — R42 Dizziness and giddiness: Secondary | ICD-10-CM | POA: Diagnosis not present

## 2017-09-14 ENCOUNTER — Encounter: Payer: Self-pay | Admitting: Internal Medicine

## 2017-09-16 ENCOUNTER — Telehealth (HOSPITAL_COMMUNITY): Payer: Self-pay

## 2017-09-16 DIAGNOSIS — Z803 Family history of malignant neoplasm of breast: Secondary | ICD-10-CM | POA: Diagnosis not present

## 2017-09-16 DIAGNOSIS — Z7982 Long term (current) use of aspirin: Secondary | ICD-10-CM | POA: Diagnosis not present

## 2017-09-16 DIAGNOSIS — I10 Essential (primary) hypertension: Secondary | ICD-10-CM | POA: Diagnosis not present

## 2017-09-16 DIAGNOSIS — E039 Hypothyroidism, unspecified: Secondary | ICD-10-CM | POA: Diagnosis not present

## 2017-09-16 DIAGNOSIS — G8929 Other chronic pain: Secondary | ICD-10-CM | POA: Diagnosis not present

## 2017-09-16 DIAGNOSIS — M1990 Unspecified osteoarthritis, unspecified site: Secondary | ICD-10-CM | POA: Diagnosis not present

## 2017-09-16 DIAGNOSIS — K219 Gastro-esophageal reflux disease without esophagitis: Secondary | ICD-10-CM | POA: Diagnosis not present

## 2017-09-16 DIAGNOSIS — Z809 Family history of malignant neoplasm, unspecified: Secondary | ICD-10-CM | POA: Diagnosis not present

## 2017-09-16 DIAGNOSIS — K5900 Constipation, unspecified: Secondary | ICD-10-CM | POA: Diagnosis not present

## 2017-09-16 DIAGNOSIS — M810 Age-related osteoporosis without current pathological fracture: Secondary | ICD-10-CM | POA: Diagnosis not present

## 2017-09-16 NOTE — Telephone Encounter (Signed)
Called to remind patient of Pulmonary Rehab Orientation on 09/20/17 at 9:30a. Patient confirmed appointment.

## 2017-09-19 DIAGNOSIS — Z0100 Encounter for examination of eyes and vision without abnormal findings: Secondary | ICD-10-CM | POA: Diagnosis not present

## 2017-09-19 DIAGNOSIS — Z961 Presence of intraocular lens: Secondary | ICD-10-CM | POA: Diagnosis not present

## 2017-09-20 ENCOUNTER — Encounter (HOSPITAL_COMMUNITY)
Admission: RE | Admit: 2017-09-20 | Discharge: 2017-09-20 | Disposition: A | Discharge status: Home / Self Care | Payer: Medicare HMO | Source: Ambulatory Visit | Attending: Thoracic Surgery (Cardiothoracic Vascular Surgery) | Admitting: Thoracic Surgery (Cardiothoracic Vascular Surgery)

## 2017-09-20 ENCOUNTER — Encounter (HOSPITAL_COMMUNITY): Payer: Self-pay

## 2017-09-20 VITALS — BP 150/65 | HR 73 | Resp 18 | Ht 65.25 in | Wt 125.0 lb

## 2017-09-20 DIAGNOSIS — C3490 Malignant neoplasm of unspecified part of unspecified bronchus or lung: Secondary | ICD-10-CM | POA: Diagnosis not present

## 2017-09-20 NOTE — Progress Notes (Signed)
Melissa Snyder 84 y.o. female Pulmonary Rehab Orientation Note Patient arrived today in Cardiac and Pulmonary Rehab for orientation to Pulmonary Rehab. She was transported from [employer] via wheel chair. She ambulates with a cane since her recent hip fracture with repair. She has not been prescribed oxygen for home or portable use. Color good, skin warm and dry. Patient is oriented to time and place. Patient's medical history, psychosocial health, and medications reviewed. Psychosocial assessment reveals pt lives with their spouse. Pt is currently retired. Pt hobbies include reading. She has lost complete sight in her left eye however is has not impaired her ability to read or her love of reading. Pt reports her stress level is low. Areas of stress/anxiety include Health.  Pt does not exhibit signs of depression. PHQ2/9 score 0/0. She has had recent weight loss over the past several years however she relates this to a lack of appitite from her lung cancer surgery and medications. She has regained 5 of the 30 lbs she has lost in 3 years. Pt shows good  coping skills with positive outlook. She is offered emotional support and reassurance. Will continue to monitor and evaluate progress toward psychosocial goal(s) of remaining positive about her ability to participate and make progress in the pulmonary rehab program. Physical assessment reveals heart rate is normal. Breath sounds clear to auscultation, no wheezes, rales, or rhonchi. Grip strength equal, strong. Distal pulses palpable. No peripheral edema. Patientdoes take medications as prescribed. Patient states she follows a Regular diet. The patient has been trying to gain weight by increasing her caloric intake and frequency of "meals". She is also consuming 1 ensure daily.. Patient's weight will be monitored closely. Demonstration and practice of PLB using pulse oximeter. Patient able to return demonstration satisfactorily. Safety and hand hygiene in the  exercise area reviewed with patient. Patient voices understanding of the information reviewed. Department expectations discussed with patient and achievable goals were set. The patient shows enthusiasm about attending the program and we look forward to working with this nice lady. The patient is scheduled for a 6 min walk test on 09/24/17 and to begin exercise on 10/01/17 at 1330.   45 minutes was spent on a variety of activities such as assessment of the patient, obtaining baseline data including height, weight, BMI, and grip strength, verifying medical history, allergies, and current medications, and teaching patient strategies for performing tasks with less respiratory effort with emphasis on pursed lip breathing.

## 2017-09-24 ENCOUNTER — Ambulatory Visit (HOSPITAL_COMMUNITY): Payer: Medicare HMO

## 2017-09-24 ENCOUNTER — Telehealth (HOSPITAL_COMMUNITY): Payer: Self-pay

## 2017-09-24 NOTE — Telephone Encounter (Signed)
Patient called and stated she will not be participating in the program as she is having knee problems. Patient stated she cannot exercise on machines after her hip surgery which is now bothering her knees. Cancelled appts and closed referral.

## 2017-09-26 ENCOUNTER — Encounter (HOSPITAL_COMMUNITY): Payer: Self-pay | Admitting: *Deleted

## 2017-10-01 ENCOUNTER — Ambulatory Visit (HOSPITAL_COMMUNITY): Payer: Medicare HMO

## 2017-10-02 DIAGNOSIS — Z9889 Other specified postprocedural states: Secondary | ICD-10-CM | POA: Diagnosis not present

## 2017-10-03 ENCOUNTER — Ambulatory Visit (HOSPITAL_COMMUNITY): Payer: Medicare HMO

## 2017-10-08 ENCOUNTER — Ambulatory Visit (HOSPITAL_COMMUNITY): Payer: Medicare HMO

## 2017-10-09 DIAGNOSIS — C3432 Malignant neoplasm of lower lobe, left bronchus or lung: Secondary | ICD-10-CM | POA: Diagnosis not present

## 2017-10-09 DIAGNOSIS — H5440 Blindness, one eye, unspecified eye: Secondary | ICD-10-CM | POA: Diagnosis not present

## 2017-10-09 DIAGNOSIS — C50911 Malignant neoplasm of unspecified site of right female breast: Secondary | ICD-10-CM | POA: Diagnosis not present

## 2017-10-09 NOTE — Addendum Note (Signed)
Encounter addended by: Jewel Baize, RD on: 10/09/2017 3:13 PM  Actions taken: Visit Navigator Flowsheet section accepted

## 2017-10-10 ENCOUNTER — Ambulatory Visit (HOSPITAL_COMMUNITY): Payer: Medicare HMO

## 2017-10-15 ENCOUNTER — Ambulatory Visit (HOSPITAL_COMMUNITY): Payer: Medicare HMO

## 2017-10-17 ENCOUNTER — Ambulatory Visit (HOSPITAL_COMMUNITY): Payer: Medicare HMO

## 2017-10-21 ENCOUNTER — Other Ambulatory Visit (HOSPITAL_COMMUNITY): Payer: Self-pay | Admitting: *Deleted

## 2017-10-22 ENCOUNTER — Ambulatory Visit (HOSPITAL_COMMUNITY): Payer: Medicare HMO

## 2017-10-22 ENCOUNTER — Ambulatory Visit (HOSPITAL_COMMUNITY)
Admission: RE | Admit: 2017-10-22 | Discharge: 2017-10-22 | Disposition: A | Discharge status: Home / Self Care | Payer: Medicare HMO | Source: Ambulatory Visit | Attending: Internal Medicine | Admitting: Internal Medicine

## 2017-10-22 DIAGNOSIS — M810 Age-related osteoporosis without current pathological fracture: Secondary | ICD-10-CM | POA: Diagnosis not present

## 2017-10-22 MED ORDER — DENOSUMAB 60 MG/ML ~~LOC~~ SOSY
60.0000 mg | PREFILLED_SYRINGE | Freq: Once | SUBCUTANEOUS | Status: AC
  Administered 2017-10-22: 60 mg via SUBCUTANEOUS
  Filled 2017-10-22: qty 1

## 2017-10-22 NOTE — Discharge Instructions (Signed)
Denosumab injection °What is this medicine? °DENOSUMAB (den oh sue mab) slows bone breakdown. Prolia is used to treat osteoporosis in women after menopause and in men. Xgeva is used to treat a high calcium level due to cancer and to prevent bone fractures and other bone problems caused by multiple myeloma or cancer bone metastases. Xgeva is also used to treat giant cell tumor of the bone. °This medicine may be used for other purposes; ask your health care provider or pharmacist if you have questions. °COMMON BRAND NAME(S): Prolia, XGEVA °What should I tell my health care provider before I take this medicine? °They need to know if you have any of these conditions: °-dental disease °-having surgery or tooth extraction °-infection °-kidney disease °-low levels of calcium or Vitamin D in the blood °-malnutrition °-on hemodialysis °-skin conditions or sensitivity °-thyroid or parathyroid disease °-an unusual reaction to denosumab, other medicines, foods, dyes, or preservatives °-pregnant or trying to get pregnant °-breast-feeding °How should I use this medicine? °This medicine is for injection under the skin. It is given by a health care professional in a hospital or clinic setting. °If you are getting Prolia, a special MedGuide will be given to you by the pharmacist with each prescription and refill. Be sure to read this information carefully each time. °For Prolia, talk to your pediatrician regarding the use of this medicine in children. Special care may be needed. For Xgeva, talk to your pediatrician regarding the use of this medicine in children. While this drug may be prescribed for children as young as 13 years for selected conditions, precautions do apply. °Overdosage: If you think you have taken too much of this medicine contact a poison control center or emergency room at once. °NOTE: This medicine is only for you. Do not share this medicine with others. °What if I miss a dose? °It is important not to miss your  dose. Call your doctor or health care professional if you are unable to keep an appointment. °What may interact with this medicine? °Do not take this medicine with any of the following medications: °-other medicines containing denosumab °This medicine may also interact with the following medications: °-medicines that lower your chance of fighting infection °-steroid medicines like prednisone or cortisone °This list may not describe all possible interactions. Give your health care provider a list of all the medicines, herbs, non-prescription drugs, or dietary supplements you use. Also tell them if you smoke, drink alcohol, or use illegal drugs. Some items may interact with your medicine. °What should I watch for while using this medicine? °Visit your doctor or health care professional for regular checks on your progress. Your doctor or health care professional may order blood tests and other tests to see how you are doing. °Call your doctor or health care professional for advice if you get a fever, chills or sore throat, or other symptoms of a cold or flu. Do not treat yourself. This drug may decrease your body's ability to fight infection. Try to avoid being around people who are sick. °You should make sure you get enough calcium and vitamin D while you are taking this medicine, unless your doctor tells you not to. Discuss the foods you eat and the vitamins you take with your health care professional. °See your dentist regularly. Brush and floss your teeth as directed. Before you have any dental work done, tell your dentist you are receiving this medicine. °Do not become pregnant while taking this medicine or for 5 months after stopping   it. Talk with your doctor or health care professional about your birth control options while taking this medicine. Women should inform their doctor if they wish to become pregnant or think they might be pregnant. There is a potential for serious side effects to an unborn child. Talk  to your health care professional or pharmacist for more information. What side effects may I notice from receiving this medicine? Side effects that you should report to your doctor or health care professional as soon as possible: -allergic reactions like skin rash, itching or hives, swelling of the face, lips, or tongue -bone pain -breathing problems -dizziness -jaw pain, especially after dental work -redness, blistering, peeling of the skin -signs and symptoms of infection like fever or chills; cough; sore throat; pain or trouble passing urine -signs of low calcium like fast heartbeat, muscle cramps or muscle pain; pain, tingling, numbness in the hands or feet; seizures -unusual bleeding or bruising -unusually weak or tired Side effects that usually do not require medical attention (report to your doctor or health care professional if they continue or are bothersome): -constipation -diarrhea -headache -joint pain -loss of appetite -muscle pain -runny nose -tiredness -upset stomach This list may not describe all possible side effects. Call your doctor for medical advice about side effects. You may report side effects to FDA at 1-800-FDA-1088. Where should I keep my medicine? This medicine is only given in a clinic, doctor's office, or other health care setting and will not be stored at home. NOTE: This sheet is a summary. It may not cover all possible information. If you have questions about this medicine, talk to your doctor, pharmacist, or health care provider.  2018 Elsevier/Gold Standard (2016-06-12 19:17:21)

## 2017-10-24 ENCOUNTER — Ambulatory Visit (HOSPITAL_COMMUNITY): Payer: Medicare HMO

## 2017-10-29 ENCOUNTER — Ambulatory Visit (HOSPITAL_COMMUNITY): Payer: Medicare HMO

## 2017-10-31 ENCOUNTER — Ambulatory Visit (HOSPITAL_COMMUNITY): Payer: Medicare HMO

## 2017-11-04 DIAGNOSIS — R69 Illness, unspecified: Secondary | ICD-10-CM | POA: Diagnosis not present

## 2017-11-05 ENCOUNTER — Ambulatory Visit (HOSPITAL_COMMUNITY): Payer: Medicare HMO

## 2017-11-07 ENCOUNTER — Ambulatory Visit (HOSPITAL_COMMUNITY): Payer: Medicare HMO

## 2017-11-11 NOTE — Addendum Note (Signed)
Encounter addended by: Gaspar Bidding on: 11/11/2017 4:28 PM  Actions taken: Episode resolved

## 2017-11-12 ENCOUNTER — Ambulatory Visit (HOSPITAL_COMMUNITY): Payer: Medicare HMO

## 2017-11-13 ENCOUNTER — Other Ambulatory Visit: Payer: Self-pay | Admitting: Adult Health

## 2017-11-14 ENCOUNTER — Ambulatory Visit (HOSPITAL_COMMUNITY): Payer: Medicare HMO

## 2017-11-15 NOTE — Addendum Note (Signed)
Encounter addended by: Ivonne Andrew, RD on: 11/15/2017 2:42 PM  Actions taken: Flowsheet data copied forward, Visit Navigator Flowsheet section accepted

## 2017-11-19 ENCOUNTER — Ambulatory Visit (HOSPITAL_COMMUNITY): Payer: Medicare HMO

## 2017-11-21 ENCOUNTER — Ambulatory Visit (HOSPITAL_COMMUNITY): Payer: Medicare HMO

## 2017-11-25 ENCOUNTER — Encounter: Payer: Medicare HMO | Admitting: Adult Health

## 2017-11-26 ENCOUNTER — Ambulatory Visit (HOSPITAL_COMMUNITY): Payer: Medicare HMO

## 2017-11-27 DIAGNOSIS — M25562 Pain in left knee: Secondary | ICD-10-CM | POA: Diagnosis not present

## 2017-11-28 ENCOUNTER — Inpatient Hospital Stay: Payer: Medicare HMO | Attending: Adult Health | Admitting: Adult Health

## 2017-11-28 ENCOUNTER — Telehealth: Payer: Self-pay | Admitting: Adult Health

## 2017-11-28 ENCOUNTER — Encounter: Payer: Self-pay | Admitting: Adult Health

## 2017-11-28 ENCOUNTER — Ambulatory Visit (HOSPITAL_COMMUNITY): Payer: Medicare HMO

## 2017-11-28 VITALS — BP 155/81 | HR 69 | Temp 97.7°F | Resp 17 | Ht 65.25 in | Wt 126.9 lb

## 2017-11-28 DIAGNOSIS — Z85820 Personal history of malignant melanoma of skin: Secondary | ICD-10-CM | POA: Diagnosis not present

## 2017-11-28 DIAGNOSIS — Z803 Family history of malignant neoplasm of breast: Secondary | ICD-10-CM

## 2017-11-28 DIAGNOSIS — Z8042 Family history of malignant neoplasm of prostate: Secondary | ICD-10-CM | POA: Insufficient documentation

## 2017-11-28 DIAGNOSIS — C3432 Malignant neoplasm of lower lobe, left bronchus or lung: Secondary | ICD-10-CM

## 2017-11-28 DIAGNOSIS — Z8052 Family history of malignant neoplasm of bladder: Secondary | ICD-10-CM | POA: Diagnosis not present

## 2017-11-28 DIAGNOSIS — C50411 Malignant neoplasm of upper-outer quadrant of right female breast: Secondary | ICD-10-CM

## 2017-11-28 DIAGNOSIS — Z170 Estrogen receptor positive status [ER+]: Secondary | ICD-10-CM

## 2017-11-28 DIAGNOSIS — Z923 Personal history of irradiation: Secondary | ICD-10-CM | POA: Diagnosis not present

## 2017-11-28 DIAGNOSIS — Z853 Personal history of malignant neoplasm of breast: Secondary | ICD-10-CM | POA: Insufficient documentation

## 2017-11-28 DIAGNOSIS — I10 Essential (primary) hypertension: Secondary | ICD-10-CM

## 2017-11-28 DIAGNOSIS — Z85118 Personal history of other malignant neoplasm of bronchus and lung: Secondary | ICD-10-CM | POA: Insufficient documentation

## 2017-11-28 NOTE — Patient Instructions (Signed)
Bone Health Bones protect organs, store calcium, and anchor muscles. Good health habits, such as eating nutritious foods and exercising regularly, are important for maintaining healthy bones. They can also help to prevent a condition that causes bones to lose density and become weak and brittle (osteoporosis). Why is bone mass important? Bone mass refers to the amount of bone tissue that you have. The higher your bone mass, the stronger your bones. An important step toward having healthy bones throughout life is to have strong and dense bones during childhood. A young adult who has a high bone mass is more likely to have a high bone mass later in life. Bone mass at its greatest it is called peak bone mass. A large decline in bone mass occurs in older adults. In women, it occurs about the time of menopause. During this time, it is important to practice good health habits, because if more bone is lost than what is replaced, the bones will become less healthy and more likely to break (fracture). If you find that you have a low bone mass, you may be able to prevent osteoporosis or further bone loss by changing your diet and lifestyle. How can I find out if my bone mass is low? Bone mass can be measured with an X-ray test that is called a bone mineral density (BMD) test. This test is recommended for all women who are age 65 or older. It may also be recommended for men who are age 70 or older, or for people who are more likely to develop osteoporosis due to:  Having bones that break easily.  Having a long-term disease that weakens bones, such as kidney disease or rheumatoid arthritis.  Having menopause earlier than normal.  Taking medicine that weakens bones, such as steroids, thyroid hormones, or hormone treatment for breast cancer or prostate cancer.  Smoking.  Drinking three or more alcoholic drinks each day.  What are the nutritional recommendations for healthy bones? To have healthy bones, you  need to get enough of the right minerals and vitamins. Most nutrition experts recommend getting these nutrients from the foods that you eat. Nutritional recommendations vary from person to person. Ask your health care provider what is healthy for you. Here are some general guidelines. Calcium Recommendations Calcium is the most important (essential) mineral for bone health. Most people can get enough calcium from their diet, but supplements may be recommended for people who are at risk for osteoporosis. Good sources of calcium include:  Dairy products, such as low-fat or nonfat milk, cheese, and yogurt.  Dark green leafy vegetables, such as bok choy and broccoli.  Calcium-fortified foods, such as orange juice, cereal, bread, soy beverages, and tofu products.  Nuts, such as almonds.  Follow these recommended amounts for daily calcium intake:  Children, age 1?3: 700 mg.  Children, age 4?8: 1,000 mg.  Children, age 9?13: 1,300 mg.  Teens, age 14?18: 1,300 mg.  Adults, age 19?50: 1,000 mg.  Adults, age 51?70: ? Men: 1,000 mg. ? Women: 1,200 mg.  Adults, age 71 or older: 1,200 mg.  Pregnant and breastfeeding females: ? Teens: 1,300 mg. ? Adults: 1,000 mg.  Vitamin D Recommendations Vitamin D is the most essential vitamin for bone health. It helps the body to absorb calcium. Sunlight stimulates the skin to make vitamin D, so be sure to get enough sunlight. If you live in a cold climate or you do not get outside often, your health care provider may recommend that you take vitamin   D supplements. Good sources of vitamin D in your diet include:  Egg yolks.  Saltwater fish.  Milk and cereal fortified with vitamin D.  Follow these recommended amounts for daily vitamin D intake:  Children and teens, age 1?18: 600 international units.  Adults, age 50 or younger: 400-800 international units.  Adults, age 51 or older: 800-1,000 international units.  Other Nutrients Other nutrients  for bone health include:  Phosphorus. This mineral is found in meat, poultry, dairy foods, nuts, and legumes. The recommended daily intake for adult men and adult women is 700 mg.  Magnesium. This mineral is found in seeds, nuts, dark green vegetables, and legumes. The recommended daily intake for adult men is 400?420 mg. For adult women, it is 310?320 mg.  Vitamin K. This vitamin is found in green leafy vegetables. The recommended daily intake is 120 mg for adult men and 90 mg for adult women.  What type of physical activity is best for building and maintaining healthy bones? Weight-bearing and strength-building activities are important for building and maintaining peak bone mass. Weight-bearing activities cause muscles and bones to work against gravity. Strength-building activities increases muscle strength that supports bones. Weight-bearing and muscle-building activities include:  Walking and hiking.  Jogging and running.  Dancing.  Gym exercises.  Lifting weights.  Tennis and racquetball.  Climbing stairs.  Aerobics.  Adults should get at least 30 minutes of moderate physical activity on most days. Children should get at least 60 minutes of moderate physical activity on most days. Ask your health care provide what type of exercise is best for you. Where can I find more information? For more information, check out the following websites:  National Osteoporosis Foundation: http://nof.org/learn/basics  National Institutes of Health: http://www.niams.nih.gov/Health_Info/Bone/Bone_Health/bone_health_for_life.asp  This information is not intended to replace advice given to you by your health care provider. Make sure you discuss any questions you have with your health care provider. Document Released: 08/11/2003 Document Revised: 12/09/2015 Document Reviewed: 05/26/2014 Elsevier Interactive Patient Education  2018 Elsevier Inc.  

## 2017-11-28 NOTE — Telephone Encounter (Signed)
No 6/27 los, orders or referrals.

## 2017-11-28 NOTE — Progress Notes (Signed)
CLINIC:  Survivorship   REASON FOR VISIT:  Routine follow-up for history of breast cancer.   BRIEF ONCOLOGIC HISTORY:  Oncology History   Breast cancer (Right breast)   Staging form: Breast, AJCC 7th Edition     Pathologic stage from 10/17/2014: Stage IA (T1b, N0, cM0) - Signed by Melissa Silversmith, MD on 10/17/2014 Lung cancer (LLL)   Staging form: Lung, AJCC 7th Edition     Clinical stage from 12/14/2014: Stage IA (T1a, N0, M0) - Unsigned       Breast cancer of upper-outer quadrant of right female breast (Melissa Snyder)   05/04/2014 Initial Diagnosis    Breast cancer      05/19/2014 Imaging    Diagnostic mammogram and ultrasound showed a 7 mm irregular suspicious mass located in the right breast at the 10:00 position, 7 cm from the nipple. Otherwise negative.      05/19/2014 Initial Biopsy    Right breast biopsy (UOQ): IDC, grade 2.       05/19/2014 Receptors her2    ER 100% positive, PR 35% positive, HER-2 negative, Ki67 8%      06/07/2014 Miscellaneous    Genetic testing normal.  APC, ATM, BARD1, BMPR1A, BRCA1, BRCA2, BRIP1, CDH1, CDK4, CDKN2A, CHEK2, EPCAM, GREM1, MLH1, MRE11A, MSH2, MSH6, MUTYH, NBN, NF1, PALB2, PMS2, POLD1, POLE, PTEN, RAD50, RAD51D, SMAD4, SMARCA4, STK11, and TP53 tested.       07/23/2014 Surgery    Right breast lumpectomy with SLNB Melissa Snyder) showed invasive ductal carcinoma & DCIS; negative surgical margins.      07/23/2014 Pathology Results    Invasive ductal carcinoma, tumor size 0.8 cm, grade 1, no lymphovascular invasion, margins were negative, 2 sentinel lymph nodes were negative. (+) DCIS.      07/23/2014 Pathologic Stage    pT1b, pN0: Stage IA       09/16/2014 - 10/07/2014 Radiation Therapy    XRT completed Melissa Snyder). Right breast/ 42.72 Gy at 2.67 Gy per fraction x 21 fractions.       01/2015 -  Anti-estrogen oral therapy    Aromasin prescribed and recommended.  Patient declines anti-estrogen therapy at this time d/t high co-pay of medication and  possible side effects of AI therapy.       05/02/2015 Mammogram    Diagnostic TOMO bilat mammogram: No evidence of malignancy in either breast. Lumpectomy changes in right breast. Diagnostic mammo suggested in 1 year.        Primary cancer of left lower lobe of lung (Melissa Snyder)   10/25/2013 Imaging    CXR done for stroke protocol. No acute chest findings. Chronic lung disease with biapical scarring. Developing nodule in (L) apex cannot be excluded based on exam, athough may be d/t overlap of osseous structures.       10/27/2013 Imaging    CXR: Nodular opacity in mid (L) apex and appears more prominent than prior study. Area that appeared masslike in LUL near apex on recent CXR not seen at this time.  Underlying emphysema present. No edema or consolidation      11/12/2013 Imaging    CT chest: At left lung apex, there is asymmetric pleural parenchymal scarring. No discrete lesion.  In superior segment of LLL, there are 1 dominant ground-glass opacity measuring 16 mm. 2 addt'l ground-glass opacities appreciated. F/U with CT in 3 months      02/11/2014 Imaging    CT chest: Several small bilat faint nodular densities, largest measures 1.3 cm over superior segment LLL. No new nodules, no  adenopathy. Cannot exclude metastatic disease given pt's h/o melanoma. Recommend PET-CT      02/23/2014 PET scan    Persistent semi-solid nodule in superior segment LLL with low metabolic activity. Moderately metabolic subcarinal & hilar LN are likely reactive.       05/25/2014 Imaging    CT chest: Persistent & similar size dominant LLL sub-solid nodule. Adenocarcinoma is considered. Biopsy or resection strongly considered. Additional scattered small ground-glass nodules stable.       06/02/2014 Imaging    MRI brain: Grossly stable appearance of left planum sphenoidale meningioma measuring 20 x 14 mm. Meningioma does not extend to left orbital apex & contacts left optic nerve. Stable right posterior clinoid  meningioma .       09/27/2014 Imaging    CT chest: Further enlargment of central solid component in sub-solid LLL pulm nodule. Findings remain concerning for adenoca. Additional scattered small bilat ground-glass nodules and biapical scarring stable. No adenopathy.        11/25/2014 Surgery    Video bronchoscopy with endobronchial ultrasound; Video assisted thoracoscopy; Thoracoscopic LLL superior segmentectomy; Mediastinal lymph node dissection; Cyro intercostal nerve block Melissa Snyder)      11/25/2014 Pathology Results    Superior segment lung resection (LLL). Adenocarcinoma, well-diff, spanning 1.5 cm. Surgical margins negative. 5 LNs negative.       11/25/2014 Pathologic Stage    pT1a, pN0: Stage IA       11/25/2014 Initial Diagnosis    Primary cancer of left lower lobe of lung ([place])      05/31/2015 Imaging    CT chest: No evidence of local recurrent at superior segmentectomy in LLL. New small nodular focus of consolidation surrounding thin parenchymal band. Additional sub-cm ground-glass and solid nodules favored to be benign. No thoracic adenopathy      08/11/2015 Imaging    CT chest: No evidence of residual or recurrent tumor in LLL. Stable small nodular focus in LLL with thin surrounding parenchymal band, favored to be benign.  Multiple small ground-glass nodules unchanged.         INTERVAL HISTORY:  Melissa Snyder presents to the Survivorship Clinic today for routine follow-up for her history of breast cancer.  Overall, she reports feeling quite well. Unfortunately, Melissa Snyder had a recurrence at her previous lung cancer site, and underwent radiation with Dr. Lisbeth Snyder in February of this year.  She tolerated the radiation well and is feeling well.  She is without chest pain, palptiations, weight loss, or any other issues.  She is confused about her follow up for her lung cancer.    Melissa Snyder denies any other issues.  Her breasts are without any nodules, masses, tenderness, nipple changes or  discharges.  She is exercising some.      REVIEW OF SYSTEMS:  Review of Systems  Constitutional: Negative for appetite change, chills, fatigue, fever and unexpected weight change.  HENT:   Negative for hearing loss, lump/mass and trouble swallowing.   Eyes: Negative for eye problems and icterus.  Respiratory: Negative for chest tightness, cough and shortness of breath.   Cardiovascular: Negative for chest pain, leg swelling and palpitations.  Gastrointestinal: Negative for abdominal distention, abdominal pain, constipation, diarrhea, nausea and vomiting.  Endocrine: Negative for hot flashes.  Skin: Negative for itching and rash.  Neurological: Negative for dizziness, extremity weakness and numbness.  Hematological: Negative for adenopathy. Does not bruise/bleed easily.  Psychiatric/Behavioral: Negative for depression. The patient is not nervous/anxious.    Breast: Denies any new nodularity, masses, tenderness, nipple  changes, or nipple discharge.       PAST MEDICAL/SURGICAL HISTORY:  Past Medical History:  Diagnosis Date  . Adenocarcinoma ([place])    recurrent/notes 07/03/2017  . Blind left eye 10/2013   cva  . Breast cancer ([place]) 05/2014   ER+/PR+/Her2-     LUNG CANCER  . Bundle branch block left   . Chest pain   . Family history of bladder cancer   . Family history of breast cancer   . Family history of prostate cancer   . GERD (gastroesophageal reflux disease)   . History of blood transfusion    age 86  . Hyperlipidemia   . Hypertension   . Hypothyroid   . Lung nodule   . Melanoma in situ of face ([place]) JUNE 2015   LEFT CHEEK  . Meningioma ([place])    brain lining  . Migraine    reports only having one  . Osteoarthritis   . Personal history of radiation therapy   . Radiation 09/16/14-10/07/14   Right  Breast  21 fractions  . Skin cancer   . Stroke [facility] 2015   Blind in left eye as a result   Past Surgical History:  Procedure Laterality Date  . ABDOMINAL  HYSTERECTOMY  ~ 1997  . APPENDECTOMY     age 30  . BILATERAL SALPINGOOPHORECTOMY    . BLADDER REPAIR  2009   cystocele  . BRAIN MENINGIOMA EXCISION     Gamma knife to Meningioma in brain lining  . BREAST BIOPSY    . BREAST LUMPECTOMY Right   . BUNIONECTOMY     bilateral great toe  . CARDIAC CATHETERIZATION  2004  . CHOLECYSTECTOMY  ~ 1997  . COLONOSCOPY    . CRYO INTERCOSTAL NERVE BLOCK Left 11/25/2014   Procedure: CRYO INTERCOSTAL NERVE BLOCK;  Surgeon: [name], MD;  Location: [facility];  Service: Thoracic;  Laterality: Left;  . CT RADIATION THERAPY GUIDE     Gamma radiation -lt frontal-[facility]  . DILATION AND CURETTAGE OF UTERUS     X 2  . HIP ARTHROPLASTY Left 07/04/2017   Procedure: ARTHROPLASTY BIPOLAR HIP (HEMIARTHROPLASTY);  Surgeon: Tania Ade, MD;  Location: [facility];  Service: Orthopedics;  Laterality: Left;  Marland Kitchen MELANOMA EXCISION Left 11/05/23, 11/30/13   CHEEK  . SEGMENTECOMY Left 11/25/2014   Procedure: LEFT LOWER LOBE SEGMENTECTOMY;  Surgeon: [name], MD;  Location: [facility];  Service: Thoracic;  Laterality: Left;  Marland Kitchen VIDEO ASSISTED THORACOSCOPY Left 11/25/2014   Procedure: VIDEO ASSISTED THORACOSCOPY;  Surgeon: [name], MD;  Location: [facility];  Service: Thoracic;  Laterality: Left;  Marland Kitchen VIDEO BRONCHOSCOPY WITH ENDOBRONCHIAL NAVIGATION N/A 06/19/2017   Procedure: VIDEO BRONCHOSCOPY;  Surgeon: [name], MD;  Location: [facility];  Service: Thoracic;  Laterality: N/A;  . VIDEO BRONCHOSCOPY WITH ENDOBRONCHIAL ULTRASOUND N/A 11/25/2014   Procedure: VIDEO BRONCHOSCOPY WITH ENDOBRONCHIAL ULTRASOUND;  Surgeon: [name], MD;  Location: [facility];  Service: Thoracic;  Laterality: N/A;     ALLERGIES:  Allergies  Allergen Reactions  . Beef-Derived Products Other (See Comments)    ACID REFLUX  . Other     FRIED FOODS UNSPECIFIED REACTION   . Chocolate Other (See Comments)    Acid Reflux  . Ibuprofen Rash and Other (See Comments)     Causes mouth sores  . Niacin And Related Other (See Comments)    Flushing, burning, redness > REACTIONS NOT ALLERGIC   . Tramadol Itching     CURRENT  MEDICATIONS:  Outpatient Encounter Medications as of 11/28/2017  Medication Sig  . atenolol (TENORMIN) 50 MG tablet Take 50 mg by mouth at bedtime.   Marland Kitchen b complex vitamins capsule Take 1 capsule by mouth daily.  . calcium-vitamin D (OSCAL WITH D) 500-200 MG-UNIT per tablet Take 1 tablet by mouth at bedtime.   . Cholecalciferol (VITAMIN D3) 1000 units CAPS Take by mouth daily.  . Digestive Enzymes (ENZYME DIGEST PO) Take 5 mLs by mouth 3 (three) times daily with meals.  . fish oil-omega-3 fatty acids 1000 MG capsule Take 1 g by mouth daily.   Marland Kitchen levothyroxine (SYNTHROID, LEVOTHROID) 112 MCG tablet Take 112 mcg by mouth daily before breakfast.   . lisinopril (PRINIVIL,ZESTRIL) 10 MG tablet Take 1 tablet (10 mg total) by mouth daily. (Patient taking differently: Take 10 mg by mouth 2 (two) times daily. )  . meclizine (ANTIVERT) 12.5 MG tablet Take 12.5 mg by mouth 3 (three) times daily as needed for dizziness.  . Multiple Vitamin (MULTIVITAMIN) tablet Take 1 tablet by mouth every morning.   . Polyvinyl Alcohol-Povidone (REFRESH OP) Apply 1 drop to eye as needed (DRY EYES).   . Triamcinolone Acetonide (NASACORT AQ NA) Place 1 spray into the nose at bedtime as needed (CONGESTION).  Marland Kitchen vitamin E (E-400) 400 UNIT capsule Take 400 Units by mouth every Monday, Wednesday, and Friday. Mon Wed Fri  . alendronate (FOSAMAX) 70 MG tablet Take 70 mg by mouth once a week. Pt takes medication before breakfast with full glass of water. Pt sits upright for 30 minutes after taking medicine. Do not  lie down or recline for at least 30 minutes after taking medication ON SUNDAYS  . HYDROcodone-acetaminophen (NORCO/VICODIN) 5-325 MG tablet Take 1-2 tablets by mouth every 6 (six) hours as needed for moderate pain. (Patient not taking: Reported on 11/28/2017)  . senna  (SENOKOT) 8.6 MG TABS tablet Take 1 tablet (8.6 mg total) by mouth 2 (two) times daily. (Patient not taking: Reported on 11/28/2017)   No facility-administered encounter medications on file as of 11/28/2017.      ONCOLOGIC FAMILY HISTORY:  Family History  Problem Relation Age of Onset  . CAD Mother        CABG  . Hyperlipidemia Mother   . AAA (abdominal aortic aneurysm) Mother   . Renal Disease Father   . Kidney disease Father   . CAD Father   . Cancer Father        PROSTATE  . Diabetes Brother   . Hyperlipidemia Brother   . Breast cancer Daughter 29  . Breast cancer Sister 69  . Breast cancer Sister 104  . Cancer Brother        NOS  . Bladder Cancer Brother 76  . Lung cancer Cousin        3 paternal cousins with lung cancer  . Breast cancer Cousin        five paternal first cousins  . Cancer Cousin        4 paternal first cousins with Cancer NOS  . Cancer Cousin        1 paternal cousin with oral cancer (tongue)     SOCIAL HISTORY:  Social History   Socioeconomic History  . Marital status: Married    Spouse name: Not on file  . Number of children: 4  . Years of education: Not on file  . Highest education level: Not on file  Occupational History  . Not on file  Social Needs  .  Financial resource strain: Not on file  . Food insecurity:    Worry: Not on file    Inability: Not on file  . Transportation needs:    Medical: Not on file    Non-medical: Not on file  Tobacco Use  . Smoking status: Never Smoker  . Smokeless tobacco: Never Used  Substance and Sexual Activity  . Alcohol use: No  . Drug use: No  . Sexual activity: Not on file  Lifestyle  . Physical activity:    Days per week: Not on file    Minutes per session: Not on file  . Stress: Not on file  Relationships  . Social connections:    Talks on phone: Not on file    Gets together: Not on file    Attends religious service: Not on file    Active member of club or organization: Not on file     Attends meetings of clubs or organizations: Not on file    Relationship status: Not on file  . Intimate partner violence:    Fear of current or ex partner: Not on file    Emotionally abused: Not on file    Physically abused: Not on file    Forced sexual activity: Not on file  Other Topics Concern  . Not on file  Social History Narrative  . Not on file      PHYSICAL EXAMINATION:  Vital Signs: Vitals:   11/28/17 1107  BP: (!) 155/81  Pulse: 69  Resp: 17  Temp: 97.7 F (36.5 C)  SpO2: 100%   Filed Weights   11/28/17 1107  Weight: 126 lb 14.4 oz (57.6 kg)   General: Well-nourished, well-appearing female in no acute distress.  Unaccompanied today.   HEENT: Head is normocephalic.  Pupils equal and reactive to light. Conjunctivae clear without exudate.  Sclerae anicteric. Oral mucosa is pink, moist.  Oropharynx is pink without lesions or erythema.  Lymph: No cervical, supraclavicular, or infraclavicular lymphadenopathy noted on palpation.  Cardiovascular: Regular rate and rhythm.Marland Kitchen Respiratory: Clear to auscultation bilaterally. Chest expansion symmetric; breathing non-labored.  Breast Exam:  -Left breast: No appreciable masses on palpation. No skin redness, thickening, or peau d'orange appearance; no nipple retraction or nipple discharge;  -Right breast: No appreciable masses on palpation. No skin redness, thickening, or peau d'orange appearance; no nipple retraction or nipple discharge; mild distortion in symmetry at previous lumpectomy site well healed scar without erythema or nodularity. -Axilla: No axillary adenopathy bilaterally.  GI: Abdomen soft and round; non-tender, non-distended. Bowel sounds normoactive. No hepatosplenomegaly.   GU: Deferred.  Neuro: No focal deficits. Steady gait.  Psych: Mood and affect normal and appropriate for situation.  MSK: No focal spinal tenderness to palpation, full range of motion in bilateral upper extremities Extremities: No edema. Skin:  Warm and dry.  LABORATORY DATA:  None for this visit   DIAGNOSTIC IMAGING:  Most recent mammogram:     Most recent Chest CT:      ASSESSMENT AND PLAN:  Ms.. Snyder is a pleasant 84 y.o. female with history of Stage IA right breast invasive ductal carcinoma, ER+/PR+/HER2-, diagnosed in 05/2014, treated with lumpectomy, adjuvant radiation therapy, and declined Aromasin.  She also has h/o stage IA left lower lobe adenocarcinoma s/p lung resection in 2016, with recent recurrence and has received radiation and radiation.  She presents to the Survivorship Clinic for surveillance and routine follow-up.   1. History of breast cancer:  Ms. Dufault is currently clinically and radiographically without evidence  of disease or recurrence of breast cancer. She will be due for mammogram in 06/2018.  She tells me that she sees Dr. Lucia Snyder regularly for her breast cancer and would like to f/u with him for every 6 month surveillance.  This is reasonable.  She is not taking any anti estrogens.    2. History of lung cancer: She recently had a recurrence.  She follows closely with Dr. Roxan Snyder.  Last CT chest appears improved from prior.  Based on Dr. Leonarda Salon last note, she may have missed f/u with Dr. Lisbeth Snyder.  I have sent a clarifying message to his PA Shona Simpson.  LTS follow up can certainly be re-instituted once she is further out from her recurrence.   3. Bone health:  Given Ms. Stitzer's age, history of breast cancer, she is at risk for bone demineralization.  I will defer to her PCP regarding bone density testing and management.  She was given education on specific food and activities to promote bone health.  4. Cancer screening:  Due to Ms. Mackintosh's history and her age, she should receive screening for skin cancers. She was encouraged to follow-up with her PCP for appropriate cancer screenings.   5. Health maintenance and wellness promotion: Ms. Oyer was encouraged to consume 5-7 servings of fruits  and vegetables per day. She was also encouraged to engage in moderate to vigorous exercise for 30 minutes per day most days of the week. She was instructed to limit her alcohol consumption and continue to abstain from tobacco use.     Dispo:  -Return to cancer center PRN for LTS follow up -F/u with Radiation oncology to be determined -Mammogram in 06/2018   A total of (30) minutes of face-to-face time was spent with this patient with greater than 50% of that time in counseling and care-coordination.   Gardenia Phlegm, NP Survivorship Program [facility] [phone]   Note: PRIMARY CARE PROVIDER [name], [place] [phone]

## 2017-12-03 ENCOUNTER — Ambulatory Visit (HOSPITAL_COMMUNITY): Payer: Medicare HMO

## 2017-12-10 ENCOUNTER — Ambulatory Visit (HOSPITAL_COMMUNITY): Payer: Medicare HMO

## 2017-12-12 ENCOUNTER — Ambulatory Visit (HOSPITAL_COMMUNITY): Payer: Medicare HMO

## 2017-12-17 ENCOUNTER — Ambulatory Visit (HOSPITAL_COMMUNITY): Payer: Medicare HMO

## 2017-12-19 ENCOUNTER — Ambulatory Visit (HOSPITAL_COMMUNITY): Payer: Medicare HMO

## 2017-12-24 ENCOUNTER — Ambulatory Visit (HOSPITAL_COMMUNITY): Payer: Medicare HMO

## 2017-12-26 ENCOUNTER — Ambulatory Visit (HOSPITAL_COMMUNITY): Payer: Medicare HMO

## 2017-12-31 ENCOUNTER — Ambulatory Visit (HOSPITAL_COMMUNITY): Payer: Medicare HMO

## 2018-01-02 ENCOUNTER — Ambulatory Visit (HOSPITAL_COMMUNITY): Payer: Medicare HMO

## 2018-01-07 ENCOUNTER — Ambulatory Visit (HOSPITAL_COMMUNITY): Payer: Medicare HMO

## 2018-01-09 ENCOUNTER — Ambulatory Visit (HOSPITAL_COMMUNITY): Payer: Medicare HMO

## 2018-01-14 ENCOUNTER — Ambulatory Visit (HOSPITAL_COMMUNITY): Payer: Medicare HMO

## 2018-01-14 DIAGNOSIS — I10 Essential (primary) hypertension: Secondary | ICD-10-CM | POA: Diagnosis not present

## 2018-01-14 DIAGNOSIS — E7849 Other hyperlipidemia: Secondary | ICD-10-CM | POA: Diagnosis not present

## 2018-01-14 DIAGNOSIS — M859 Disorder of bone density and structure, unspecified: Secondary | ICD-10-CM | POA: Diagnosis not present

## 2018-01-14 DIAGNOSIS — E038 Other specified hypothyroidism: Secondary | ICD-10-CM | POA: Diagnosis not present

## 2018-01-14 DIAGNOSIS — R82998 Other abnormal findings in urine: Secondary | ICD-10-CM | POA: Diagnosis not present

## 2018-01-16 ENCOUNTER — Ambulatory Visit (HOSPITAL_COMMUNITY): Payer: Medicare HMO

## 2018-01-21 DIAGNOSIS — Z0000 Encounter for general adult medical examination without abnormal findings: Secondary | ICD-10-CM | POA: Diagnosis not present

## 2018-01-21 DIAGNOSIS — H53139 Sudden visual loss, unspecified eye: Secondary | ICD-10-CM | POA: Diagnosis not present

## 2018-01-21 DIAGNOSIS — C439 Malignant melanoma of skin, unspecified: Secondary | ICD-10-CM | POA: Diagnosis not present

## 2018-01-21 DIAGNOSIS — C50919 Malignant neoplasm of unspecified site of unspecified female breast: Secondary | ICD-10-CM | POA: Diagnosis not present

## 2018-01-21 DIAGNOSIS — C3490 Malignant neoplasm of unspecified part of unspecified bronchus or lung: Secondary | ICD-10-CM | POA: Diagnosis not present

## 2018-01-21 DIAGNOSIS — M810 Age-related osteoporosis without current pathological fracture: Secondary | ICD-10-CM | POA: Diagnosis not present

## 2018-01-21 DIAGNOSIS — R350 Frequency of micturition: Secondary | ICD-10-CM | POA: Diagnosis not present

## 2018-01-21 DIAGNOSIS — N8110 Cystocele, unspecified: Secondary | ICD-10-CM | POA: Diagnosis not present

## 2018-01-21 DIAGNOSIS — S72002S Fracture of unspecified part of neck of left femur, sequela: Secondary | ICD-10-CM | POA: Diagnosis not present

## 2018-01-21 DIAGNOSIS — R102 Pelvic and perineal pain: Secondary | ICD-10-CM | POA: Diagnosis not present

## 2018-02-18 ENCOUNTER — Encounter: Payer: Self-pay | Admitting: Radiation Oncology

## 2018-03-03 DIAGNOSIS — R05 Cough: Secondary | ICD-10-CM | POA: Diagnosis not present

## 2018-03-03 DIAGNOSIS — J329 Chronic sinusitis, unspecified: Secondary | ICD-10-CM | POA: Diagnosis not present

## 2018-03-03 DIAGNOSIS — R51 Headache: Secondary | ICD-10-CM | POA: Diagnosis not present

## 2018-03-05 ENCOUNTER — Telehealth: Payer: Self-pay

## 2018-03-05 NOTE — Telephone Encounter (Signed)
Melissa Snyder contacted the office to see if she needed a return appointment with Dr. Roxan Hockey and to thank him for help with appealing a CT scan that was performed.  I returned her call and left a voicemail message stated that she did not need a return appointment unless she had any questions or concerns or any problems arose.  I also advised her via VM to contact her Oncologist, Dr. Lisbeth Renshaw as she needed to see him back for a follow-up appointment.  Will await a return call from her.

## 2018-03-10 ENCOUNTER — Telehealth: Payer: Self-pay | Admitting: *Deleted

## 2018-03-10 NOTE — Telephone Encounter (Signed)
CALLED PATIENT TO INFORM OF STAT LABS ON 03-14-18 @ 12:45 PM AND HER CT ON 03-14-18 - ARRIVAL TIME - 1:45 PM @ [facility], PT. TO HAVE WATER ONLY - 4 HRS. PRIOR TO TEST, SPOKE WITH PATIENT AND SHE IS AWARE OF THESE APPTS.

## 2018-03-14 ENCOUNTER — Ambulatory Visit (HOSPITAL_COMMUNITY)
Admission: RE | Admit: 2018-03-14 | Discharge: 2018-03-14 | Disposition: A | Discharge status: Home / Self Care | Payer: Medicare HMO | Source: Ambulatory Visit | Attending: Radiation Oncology | Admitting: Radiation Oncology

## 2018-03-14 ENCOUNTER — Encounter (HOSPITAL_COMMUNITY): Payer: Self-pay

## 2018-03-14 ENCOUNTER — Ambulatory Visit
Admission: RE | Admit: 2018-03-14 | Discharge: 2018-03-14 | Disposition: A | Discharge status: Home / Self Care | Payer: Medicare HMO | Source: Ambulatory Visit | Attending: Radiation Oncology | Admitting: Radiation Oncology

## 2018-03-14 DIAGNOSIS — C3432 Malignant neoplasm of lower lobe, left bronchus or lung: Secondary | ICD-10-CM

## 2018-03-14 DIAGNOSIS — R918 Other nonspecific abnormal finding of lung field: Secondary | ICD-10-CM | POA: Diagnosis not present

## 2018-03-14 DIAGNOSIS — I700 Atherosclerosis of aorta: Secondary | ICD-10-CM | POA: Insufficient documentation

## 2018-03-14 DIAGNOSIS — Z923 Personal history of irradiation: Secondary | ICD-10-CM | POA: Insufficient documentation

## 2018-03-14 DIAGNOSIS — C50911 Malignant neoplasm of unspecified site of right female breast: Secondary | ICD-10-CM | POA: Diagnosis not present

## 2018-03-14 LAB — BUN & CREATININE (CHCC)
BUN: 14 mg/dL (ref 8–23)
CREATININE: 0.69 mg/dL (ref 0.44–1.00)
GFR, Est AFR Am: >60 mL/min (ref >60)
GFR, Estimated: >60 mL/min (ref >60)

## 2018-03-14 MED ORDER — SODIUM CHLORIDE 0.9 % IJ SOLN
INTRAMUSCULAR | Status: AC
  Filled 2018-03-14: qty 50

## 2018-03-14 MED ORDER — IOHEXOL 300 MG/ML  SOLN
75.0000 mL | Freq: Once | INTRAMUSCULAR | Status: AC | PRN
  Administered 2018-03-14: 75 mL via INTRAVENOUS

## 2018-03-18 ENCOUNTER — Telehealth: Payer: Self-pay | Admitting: Radiation Oncology

## 2018-03-18 DIAGNOSIS — C3432 Malignant neoplasm of lower lobe, left bronchus or lung: Secondary | ICD-10-CM

## 2018-03-19 ENCOUNTER — Telehealth: Payer: Self-pay | Admitting: *Deleted

## 2018-03-19 NOTE — Telephone Encounter (Signed)
LM for the patient regarding results from recent CT and plan to repeat in 6 months and that I'd like to see her.

## 2018-03-19 NOTE — Telephone Encounter (Signed)
RETURNED PATIENT'S PHONE CALL, SPOKE WITH PATIENT. ?

## 2018-03-22 DIAGNOSIS — R69 Illness, unspecified: Secondary | ICD-10-CM | POA: Diagnosis not present

## 2018-04-10 ENCOUNTER — Other Ambulatory Visit (HOSPITAL_COMMUNITY): Payer: Self-pay | Admitting: *Deleted

## 2018-04-10 ENCOUNTER — Other Ambulatory Visit (HOSPITAL_COMMUNITY): Payer: Self-pay

## 2018-04-11 ENCOUNTER — Ambulatory Visit (HOSPITAL_COMMUNITY)
Admission: RE | Admit: 2018-04-11 | Discharge: 2018-04-11 | Disposition: A | Discharge status: Home / Self Care | Payer: Medicare HMO | Source: Ambulatory Visit | Attending: Internal Medicine | Admitting: Internal Medicine

## 2018-04-11 DIAGNOSIS — M810 Age-related osteoporosis without current pathological fracture: Secondary | ICD-10-CM | POA: Insufficient documentation

## 2018-04-11 MED ORDER — DENOSUMAB 60 MG/ML ~~LOC~~ SOSY
PREFILLED_SYRINGE | SUBCUTANEOUS | Status: AC
  Filled 2018-04-11: qty 1

## 2018-04-11 MED ORDER — DENOSUMAB 60 MG/ML ~~LOC~~ SOSY
60.0000 mg | PREFILLED_SYRINGE | Freq: Once | SUBCUTANEOUS | Status: AC
  Administered 2018-04-11: 60 mg via SUBCUTANEOUS

## 2018-04-17 DIAGNOSIS — H5440 Blindness, one eye, unspecified eye: Secondary | ICD-10-CM | POA: Diagnosis not present

## 2018-04-17 DIAGNOSIS — C3432 Malignant neoplasm of lower lobe, left bronchus or lung: Secondary | ICD-10-CM | POA: Diagnosis not present

## 2018-04-17 DIAGNOSIS — C50911 Malignant neoplasm of unspecified site of right female breast: Secondary | ICD-10-CM | POA: Diagnosis not present

## 2018-04-28 ENCOUNTER — Ambulatory Visit (HOSPITAL_COMMUNITY)
Admission: EM | Admit: 2018-04-28 | Discharge: 2018-04-28 | Disposition: A | Discharge status: Home / Self Care | Payer: Medicare HMO | Attending: Family Medicine | Admitting: Family Medicine

## 2018-04-28 ENCOUNTER — Encounter (HOSPITAL_COMMUNITY): Payer: Self-pay | Admitting: Emergency Medicine

## 2018-04-28 DIAGNOSIS — R21 Rash and other nonspecific skin eruption: Secondary | ICD-10-CM | POA: Diagnosis not present

## 2018-04-28 NOTE — ED Triage Notes (Signed)
Pt states for the last six weeks shes been bitten while asleep in bed, pt has tried to do bed bug treatment, pt states shes been bitten on her bottom, elbow, lower abdomen. Pt states she has not seen anything, has checked multiple times. Pt states she was bitten last night on her back.

## 2018-04-29 DIAGNOSIS — I10 Essential (primary) hypertension: Secondary | ICD-10-CM | POA: Diagnosis not present

## 2018-04-29 DIAGNOSIS — Z6821 Body mass index (BMI) 21.0-21.9, adult: Secondary | ICD-10-CM | POA: Diagnosis not present

## 2018-04-29 DIAGNOSIS — R238 Other skin changes: Secondary | ICD-10-CM | POA: Diagnosis not present

## 2018-05-21 NOTE — ED Provider Notes (Signed)
[facility]   [id number] 04/28/18 Arrival Time: 1109  ASSESSMENT & PLAN:  1. Rash and nonspecific skin eruption    Very low suspicion that these represent bed bug bites. Reassured. Declines trial of steroid cream. Prefers to monitor. Difficult to tell exact etiology. Discussed scabies.  Reviewed expectations re: course of current medical issues. Questions answered. Outlined signs and symptoms indicating need for more acute intervention. Patient verbalized understanding. After Visit Summary given.   SUBJECTIVE:  Melissa Snyder is a 84 y.o. female who presents with a skin complaint. Has noticed "small red bumps" on her lower abdomen; at least 6 weeks since first noticing; "they come and go"; occasional sensation of "things crawling on my skin"; no new medications. Associated pruritis? mild Associated pain? none Progression: fluctuating a lot  Drainage? No  Known trigger? none known; but she is very worried that she could have bed bugs at home. No bumps on her extremities. Has not seen any blood on her sheets. Has self-treated her mattress and bedroom for bed bugs. New soaps/lotions/topicals/detergents/environmental exposures? No Contacts with similar? No Recent travel? No  Other associated symptoms: none Therapies tried thus far: none Arthralgia or myalgia? none Recent illness? none Fever? none No specific aggravating or alleviating factors reported.  ROS: As per HPI.  OBJECTIVE: Vitals:   04/28/18 1235  BP: (!) 163/75  Pulse: 70  Resp: 16  Temp: 98 F (36.7 C)  SpO2: 100%    General appearance: alert; no distress Lungs: clear to auscultation bilaterally Heart: regular rate and rhythm Extremities: no edema Skin: warm and dry; signs of infection: no; just a couple of 2-3 mm round to oval bumps on her lower R abdomen; no specific pattern; no surrounding erythema; cool to touch; no pain with palpation; no rashes/lesions on extremities Psychological: alert  and cooperative; normal mood and affect  Allergies  Allergen Reactions  . Beef-Derived Products Other (See Comments)    ACID REFLUX  . Other     FRIED FOODS UNSPECIFIED REACTION   . Chocolate Other (See Comments)    Acid Reflux  . Ibuprofen Rash and Other (See Comments)    Causes mouth sores  . Niacin And Related Other (See Comments)    Flushing, burning, redness > REACTIONS NOT ALLERGIC   . Tramadol Itching    Past Medical History:  Diagnosis Date  . Adenocarcinoma ([place])    recurrent/notes 07/03/2017  . Blind left eye 10/2013   cva  . Breast cancer ([place]) 05/2014   ER+/PR+/Her2-     LUNG CANCER  . Bundle branch block left   . Chest pain   . Family history of bladder cancer   . Family history of breast cancer   . Family history of prostate cancer   . GERD (gastroesophageal reflux disease)   . History of blood transfusion    age 57  . Hyperlipidemia   . Hypertension   . Hypothyroid   . Lung nodule   . Melanoma in situ of face ([place]) JUNE 2015   LEFT CHEEK  . Meningioma ([place])    brain lining  . Migraine    reports only having one  . Osteoarthritis   . Personal history of radiation therapy   . Radiation 09/16/14-10/07/14   Right  Breast  21 fractions  . Skin cancer   . Stroke [facility]) 2015   Blind in left eye as a result   Social History   Socioeconomic History  . Marital status: Married    Spouse  name: Not on file  . Number of children: 4  . Years of education: Not on file  . Highest education level: Not on file  Occupational History  . Not on file  Social Needs  . Financial resource strain: Not on file  . Food insecurity:    Worry: Not on file    Inability: Not on file  . Transportation needs:    Medical: Not on file    Non-medical: Not on file  Tobacco Use  . Smoking status: Never Smoker  . Smokeless tobacco: Never Used  Substance and Sexual Activity  . Alcohol use: No  . Drug use: No  . Sexual activity: Not on file  Lifestyle  . Physical activity:      Days per week: Not on file    Minutes per session: Not on file  . Stress: Not on file  Relationships  . Social connections:    Talks on phone: Not on file    Gets together: Not on file    Attends religious service: Not on file    Active member of club or organization: Not on file    Attends meetings of clubs or organizations: Not on file    Relationship status: Not on file  . Intimate partner violence:    Fear of current or ex partner: Not on file    Emotionally abused: Not on file    Physically abused: Not on file    Forced sexual activity: Not on file  Other Topics Concern  . Not on file  Social History Narrative  . Not on file   Family History  Problem Relation Age of Onset  . CAD Mother        CABG  . Hyperlipidemia Mother   . AAA (abdominal aortic aneurysm) Mother   . Renal Disease Father   . Kidney disease Father   . CAD Father   . Cancer Father        PROSTATE  . Diabetes Brother   . Hyperlipidemia Brother   . Breast cancer Daughter 56  . Breast cancer Sister 82  . Breast cancer Sister 33  . Cancer Brother        NOS  . Bladder Cancer Brother 40  . Lung cancer Cousin        3 paternal cousins with lung cancer  . Breast cancer Cousin        five paternal first cousins  . Cancer Cousin        4 paternal first cousins with Cancer NOS  . Cancer Cousin        1 paternal cousin with oral cancer (tongue)   Past Surgical History:  Procedure Laterality Date  . ABDOMINAL HYSTERECTOMY  ~ 1997  . APPENDECTOMY     age 61  . BILATERAL SALPINGOOPHORECTOMY    . BLADDER REPAIR  2009   cystocele  . BRAIN MENINGIOMA EXCISION     Gamma knife to Meningioma in brain lining  . BREAST BIOPSY    . BREAST LUMPECTOMY Right   . BUNIONECTOMY     bilateral great toe  . CARDIAC CATHETERIZATION  2004  . CHOLECYSTECTOMY  ~ 1997  . COLONOSCOPY    . CRYO INTERCOSTAL NERVE BLOCK Left 11/25/2014   Procedure: CRYO INTERCOSTAL NERVE BLOCK;  Surgeon: [name], MD;   Location: [place];  Service: Thoracic;  Laterality: Left;  . CT RADIATION THERAPY GUIDE     Gamma radiation -lt frontal-[facility]  . DILATION AND CURETTAGE  OF UTERUS     X 2  . HIP ARTHROPLASTY Left 07/04/2017   Procedure: ARTHROPLASTY BIPOLAR HIP (HEMIARTHROPLASTY);  Surgeon: Tania Ade, MD;  Location: [place];  Service: Orthopedics;  Laterality: Left;  Marland Kitchen MELANOMA EXCISION Left 11/05/23, 11/30/13   CHEEK  . SEGMENTECOMY Left 11/25/2014   Procedure: LEFT LOWER LOBE SEGMENTECTOMY;  Surgeon: [name], MD;  Location: [place];  Service: Thoracic;  Laterality: Left;  Marland Kitchen VIDEO ASSISTED THORACOSCOPY Left 11/25/2014   Procedure: VIDEO ASSISTED THORACOSCOPY;  Surgeon: [name], MD;  Location: [place];  Service: Thoracic;  Laterality: Left;  Marland Kitchen VIDEO BRONCHOSCOPY WITH ENDOBRONCHIAL NAVIGATION N/A 06/19/2017   Procedure: VIDEO BRONCHOSCOPY;  Surgeon: [name], MD;  Location: [place];  Service: Thoracic;  Laterality: N/A;  . VIDEO BRONCHOSCOPY WITH ENDOBRONCHIAL ULTRASOUND N/A 11/25/2014   Procedure: VIDEO BRONCHOSCOPY WITH ENDOBRONCHIAL ULTRASOUND;  Surgeon: [name], MD;  Location: [place];  Service: Thoracic;  Laterality: Ginette Pitman, MD 05/21/18 1448

## 2018-05-23 ENCOUNTER — Other Ambulatory Visit: Payer: Self-pay | Admitting: Internal Medicine

## 2018-05-23 DIAGNOSIS — Z9889 Other specified postprocedural states: Secondary | ICD-10-CM

## 2018-06-19 ENCOUNTER — Ambulatory Visit
Admission: RE | Admit: 2018-06-19 | Discharge: 2018-06-19 | Disposition: A | Discharge status: Home / Self Care | Payer: Medicare HMO | Source: Ambulatory Visit | Attending: Internal Medicine | Admitting: Internal Medicine

## 2018-06-19 DIAGNOSIS — R928 Other abnormal and inconclusive findings on diagnostic imaging of breast: Secondary | ICD-10-CM | POA: Diagnosis not present

## 2018-06-19 DIAGNOSIS — Z9889 Other specified postprocedural states: Secondary | ICD-10-CM

## 2018-06-21 DIAGNOSIS — I10 Essential (primary) hypertension: Secondary | ICD-10-CM | POA: Diagnosis not present

## 2018-06-21 DIAGNOSIS — J302 Other seasonal allergic rhinitis: Secondary | ICD-10-CM | POA: Diagnosis not present

## 2018-06-21 DIAGNOSIS — Z7982 Long term (current) use of aspirin: Secondary | ICD-10-CM | POA: Diagnosis not present

## 2018-06-21 DIAGNOSIS — M810 Age-related osteoporosis without current pathological fracture: Secondary | ICD-10-CM | POA: Diagnosis not present

## 2018-06-21 DIAGNOSIS — E039 Hypothyroidism, unspecified: Secondary | ICD-10-CM | POA: Diagnosis not present

## 2018-06-21 DIAGNOSIS — H5462 Unqualified visual loss, left eye, normal vision right eye: Secondary | ICD-10-CM | POA: Diagnosis not present

## 2018-06-21 DIAGNOSIS — M1990 Unspecified osteoarthritis, unspecified site: Secondary | ICD-10-CM | POA: Diagnosis not present

## 2018-06-21 DIAGNOSIS — Z803 Family history of malignant neoplasm of breast: Secondary | ICD-10-CM | POA: Diagnosis not present

## 2018-06-21 DIAGNOSIS — K219 Gastro-esophageal reflux disease without esophagitis: Secondary | ICD-10-CM | POA: Diagnosis not present

## 2018-06-21 DIAGNOSIS — G8929 Other chronic pain: Secondary | ICD-10-CM | POA: Diagnosis not present

## 2018-09-03 ENCOUNTER — Telehealth: Payer: Self-pay

## 2018-09-03 NOTE — Telephone Encounter (Signed)
Spoke with pt and advised that follow-up appointment will be conducted over the phone with ADara Lords PA on 4/16 at scheduled appointment time. Pt verbalized understanding.

## 2018-09-12 ENCOUNTER — Telehealth: Payer: Self-pay | Admitting: *Deleted

## 2018-09-12 NOTE — Telephone Encounter (Signed)
CALLED PATIENT TO INFORM OF STAT LABS - FOR 10-03-18 - ARRIVAL TIME - 11:45 @ [place] AND HER CT - ARRIVAL TIME - 12:45 AM  @ [facility] , PT. TO HAVE WATER ONLY - 4 HRS. PRIOR TO TEST, AND ALISON PERKINS TO CALL PATIENT WITH THE RESULTS, SPOKE WITH PATIENT AND SHE IS AWARE OF THESE APPTS.

## 2018-09-12 NOTE — Telephone Encounter (Signed)
Called patient to ask [facility], lvm for a return call

## 2018-09-18 ENCOUNTER — Ambulatory Visit: Payer: Self-pay | Admitting: Radiation Oncology

## 2018-10-03 ENCOUNTER — Ambulatory Visit
Admission: RE | Admit: 2018-10-03 | Discharge: 2018-10-03 | Disposition: A | Discharge status: Home / Self Care | Payer: Medicare HMO | Source: Ambulatory Visit | Attending: Radiation Oncology | Admitting: Radiation Oncology

## 2018-10-03 ENCOUNTER — Ambulatory Visit (HOSPITAL_COMMUNITY)
Admission: RE | Admit: 2018-10-03 | Discharge: 2018-10-03 | Disposition: A | Discharge status: Home / Self Care | Payer: Medicare HMO | Source: Ambulatory Visit | Attending: Radiation Oncology | Admitting: Radiation Oncology

## 2018-10-03 ENCOUNTER — Other Ambulatory Visit: Payer: Self-pay

## 2018-10-03 ENCOUNTER — Ambulatory Visit (HOSPITAL_COMMUNITY): Payer: Medicare HMO

## 2018-10-03 ENCOUNTER — Encounter (HOSPITAL_COMMUNITY): Payer: Self-pay

## 2018-10-03 DIAGNOSIS — R918 Other nonspecific abnormal finding of lung field: Secondary | ICD-10-CM | POA: Diagnosis not present

## 2018-10-03 DIAGNOSIS — C3432 Malignant neoplasm of lower lobe, left bronchus or lung: Secondary | ICD-10-CM | POA: Diagnosis not present

## 2018-10-03 LAB — BUN & CREATININE (CHCC)
BUN: 18 mg/dL (ref 8–23)
Creatinine: 0.74 mg/dL (ref 0.44–1.00)
GFR, Est AFR Am: >60 mL/min (ref >60)
GFR, Estimated: >60 mL/min (ref >60)

## 2018-10-03 MED ORDER — IOHEXOL 300 MG/ML  SOLN
75.0000 mL | Freq: Once | INTRAMUSCULAR | Status: AC | PRN
  Administered 2018-10-03: 14:00:00 75 mL via INTRAVENOUS

## 2018-10-03 MED ORDER — SODIUM CHLORIDE (PF) 0.9 % IJ SOLN
INTRAMUSCULAR | Status: AC
  Filled 2018-10-03: qty 50

## 2018-10-06 ENCOUNTER — Telehealth: Payer: Self-pay | Admitting: Radiation Oncology

## 2018-10-06 NOTE — Telephone Encounter (Signed)
I called the patient to review her CT results and our plans to repeat her next scan in 6 months.

## 2018-10-20 DIAGNOSIS — H349 Unspecified retinal vascular occlusion: Secondary | ICD-10-CM | POA: Diagnosis not present

## 2018-10-20 DIAGNOSIS — Z961 Presence of intraocular lens: Secondary | ICD-10-CM | POA: Diagnosis not present

## 2018-10-20 DIAGNOSIS — H5213 Myopia, bilateral: Secondary | ICD-10-CM | POA: Diagnosis not present

## 2018-10-30 DIAGNOSIS — Z20818 Contact with and (suspected) exposure to other bacterial communicable diseases: Secondary | ICD-10-CM | POA: Diagnosis not present

## 2018-10-30 DIAGNOSIS — J309 Allergic rhinitis, unspecified: Secondary | ICD-10-CM | POA: Diagnosis not present

## 2018-11-05 DIAGNOSIS — R69 Illness, unspecified: Secondary | ICD-10-CM | POA: Diagnosis not present

## 2018-11-17 ENCOUNTER — Other Ambulatory Visit (HOSPITAL_COMMUNITY): Payer: Self-pay | Admitting: *Deleted

## 2018-11-18 ENCOUNTER — Other Ambulatory Visit: Payer: Self-pay

## 2018-11-18 ENCOUNTER — Ambulatory Visit (HOSPITAL_COMMUNITY)
Admission: RE | Admit: 2018-11-18 | Discharge: 2018-11-18 | Disposition: A | Discharge status: Home / Self Care | Payer: Medicare HMO | Source: Ambulatory Visit | Attending: Internal Medicine | Admitting: Internal Medicine

## 2018-11-18 DIAGNOSIS — M810 Age-related osteoporosis without current pathological fracture: Secondary | ICD-10-CM | POA: Diagnosis not present

## 2018-11-18 MED ORDER — DENOSUMAB 60 MG/ML ~~LOC~~ SOSY
PREFILLED_SYRINGE | SUBCUTANEOUS | Status: AC
  Administered 2018-11-18: 60 mg via SUBCUTANEOUS
  Filled 2018-11-18: qty 1

## 2018-11-18 MED ORDER — DENOSUMAB 60 MG/ML ~~LOC~~ SOSY
60.0000 mg | PREFILLED_SYRINGE | Freq: Once | SUBCUTANEOUS | Status: AC
  Administered 2018-11-18: 60 mg via SUBCUTANEOUS

## 2018-11-19 DIAGNOSIS — Z85820 Personal history of malignant melanoma of skin: Secondary | ICD-10-CM | POA: Diagnosis not present

## 2018-11-19 DIAGNOSIS — L989 Disorder of the skin and subcutaneous tissue, unspecified: Secondary | ICD-10-CM | POA: Diagnosis not present

## 2018-11-19 DIAGNOSIS — D485 Neoplasm of uncertain behavior of skin: Secondary | ICD-10-CM | POA: Diagnosis not present

## 2018-11-20 DIAGNOSIS — C3432 Malignant neoplasm of lower lobe, left bronchus or lung: Secondary | ICD-10-CM | POA: Diagnosis not present

## 2018-11-20 DIAGNOSIS — C50911 Malignant neoplasm of unspecified site of right female breast: Secondary | ICD-10-CM | POA: Diagnosis not present

## 2018-11-20 DIAGNOSIS — H5442A5 Blindness left eye category 5, normal vision right eye: Secondary | ICD-10-CM | POA: Diagnosis not present

## 2019-01-06 DIAGNOSIS — L821 Other seborrheic keratosis: Secondary | ICD-10-CM | POA: Diagnosis not present

## 2019-01-06 DIAGNOSIS — D229 Melanocytic nevi, unspecified: Secondary | ICD-10-CM | POA: Diagnosis not present

## 2019-01-06 DIAGNOSIS — Z85820 Personal history of malignant melanoma of skin: Secondary | ICD-10-CM | POA: Diagnosis not present

## 2019-01-06 DIAGNOSIS — L218 Other seborrheic dermatitis: Secondary | ICD-10-CM | POA: Diagnosis not present

## 2019-01-06 DIAGNOSIS — L814 Other melanin hyperpigmentation: Secondary | ICD-10-CM | POA: Diagnosis not present

## 2019-02-19 DIAGNOSIS — E038 Other specified hypothyroidism: Secondary | ICD-10-CM | POA: Diagnosis not present

## 2019-02-19 DIAGNOSIS — E7849 Other hyperlipidemia: Secondary | ICD-10-CM | POA: Diagnosis not present

## 2019-02-19 DIAGNOSIS — M810 Age-related osteoporosis without current pathological fracture: Secondary | ICD-10-CM | POA: Diagnosis not present

## 2019-02-26 DIAGNOSIS — C3490 Malignant neoplasm of unspecified part of unspecified bronchus or lung: Secondary | ICD-10-CM | POA: Diagnosis not present

## 2019-02-26 DIAGNOSIS — R82998 Other abnormal findings in urine: Secondary | ICD-10-CM | POA: Diagnosis not present

## 2019-02-26 DIAGNOSIS — N8110 Cystocele, unspecified: Secondary | ICD-10-CM | POA: Diagnosis not present

## 2019-02-26 DIAGNOSIS — I10 Essential (primary) hypertension: Secondary | ICD-10-CM | POA: Diagnosis not present

## 2019-02-26 DIAGNOSIS — Z0000 Encounter for general adult medical examination without abnormal findings: Secondary | ICD-10-CM | POA: Diagnosis not present

## 2019-02-26 DIAGNOSIS — D329 Benign neoplasm of meninges, unspecified: Secondary | ICD-10-CM | POA: Diagnosis not present

## 2019-02-26 DIAGNOSIS — C439 Malignant melanoma of skin, unspecified: Secondary | ICD-10-CM | POA: Diagnosis not present

## 2019-02-26 DIAGNOSIS — Z1331 Encounter for screening for depression: Secondary | ICD-10-CM | POA: Diagnosis not present

## 2019-02-26 DIAGNOSIS — R350 Frequency of micturition: Secondary | ICD-10-CM | POA: Diagnosis not present

## 2019-02-26 DIAGNOSIS — M810 Age-related osteoporosis without current pathological fracture: Secondary | ICD-10-CM | POA: Diagnosis not present

## 2019-02-26 DIAGNOSIS — S72002S Fracture of unspecified part of neck of left femur, sequela: Secondary | ICD-10-CM | POA: Diagnosis not present

## 2019-02-26 DIAGNOSIS — R42 Dizziness and giddiness: Secondary | ICD-10-CM | POA: Diagnosis not present

## 2019-02-26 DIAGNOSIS — C50919 Malignant neoplasm of unspecified site of unspecified female breast: Secondary | ICD-10-CM | POA: Diagnosis not present

## 2019-03-04 DIAGNOSIS — Z1212 Encounter for screening for malignant neoplasm of rectum: Secondary | ICD-10-CM | POA: Diagnosis not present

## 2019-03-06 DIAGNOSIS — Z23 Encounter for immunization: Secondary | ICD-10-CM | POA: Diagnosis not present

## 2019-05-05 DIAGNOSIS — E039 Hypothyroidism, unspecified: Secondary | ICD-10-CM | POA: Diagnosis not present

## 2019-05-05 DIAGNOSIS — C50919 Malignant neoplasm of unspecified site of unspecified female breast: Secondary | ICD-10-CM | POA: Diagnosis not present

## 2019-05-05 DIAGNOSIS — R630 Anorexia: Secondary | ICD-10-CM | POA: Diagnosis not present

## 2019-05-05 DIAGNOSIS — C439 Malignant melanoma of skin, unspecified: Secondary | ICD-10-CM | POA: Diagnosis not present

## 2019-05-05 DIAGNOSIS — C3490 Malignant neoplasm of unspecified part of unspecified bronchus or lung: Secondary | ICD-10-CM | POA: Diagnosis not present

## 2019-05-05 DIAGNOSIS — R42 Dizziness and giddiness: Secondary | ICD-10-CM | POA: Diagnosis not present

## 2019-05-05 DIAGNOSIS — D329 Benign neoplasm of meninges, unspecified: Secondary | ICD-10-CM | POA: Diagnosis not present

## 2019-05-08 ENCOUNTER — Telehealth: Payer: Self-pay | Admitting: Hematology

## 2019-05-08 NOTE — Telephone Encounter (Signed)
Left message re December appointment. Schedule mailed.

## 2019-05-11 ENCOUNTER — Other Ambulatory Visit: Payer: Self-pay | Admitting: Internal Medicine

## 2019-05-11 DIAGNOSIS — Z9889 Other specified postprocedural states: Secondary | ICD-10-CM

## 2019-05-12 ENCOUNTER — Telehealth: Payer: Self-pay | Admitting: *Deleted

## 2019-05-12 NOTE — Telephone Encounter (Signed)
Returned patient's phone call, lvm for a return call 

## 2019-05-14 DIAGNOSIS — E039 Hypothyroidism, unspecified: Secondary | ICD-10-CM | POA: Diagnosis not present

## 2019-05-14 DIAGNOSIS — R630 Anorexia: Secondary | ICD-10-CM | POA: Diagnosis not present

## 2019-05-14 DIAGNOSIS — D329 Benign neoplasm of meninges, unspecified: Secondary | ICD-10-CM | POA: Diagnosis not present

## 2019-05-14 DIAGNOSIS — R42 Dizziness and giddiness: Secondary | ICD-10-CM | POA: Diagnosis not present

## 2019-05-14 DIAGNOSIS — C3490 Malignant neoplasm of unspecified part of unspecified bronchus or lung: Secondary | ICD-10-CM | POA: Diagnosis not present

## 2019-05-14 DIAGNOSIS — C50919 Malignant neoplasm of unspecified site of unspecified female breast: Secondary | ICD-10-CM | POA: Diagnosis not present

## 2019-05-14 DIAGNOSIS — C439 Malignant melanoma of skin, unspecified: Secondary | ICD-10-CM | POA: Diagnosis not present

## 2019-06-01 NOTE — Progress Notes (Signed)
[name]   [phone]) [phone] [phone]   Clinic Follow up Note   Patient Care Team: [name], MD as PCP - General (Internal Medicine) [name], MD as Consulting Physician (Cardiothoracic Surgery) [name], MD as Consulting Physician (Urology) [name], MD as Consulting Physician (Hematology) [name], MD as Consulting Physician (Radiation Oncology)  Date of Service:  06/02/2019  CHIEF COMPLAINT: Follow up right breast caner   SUMMARY OF ONCOLOGIC HISTORY: Oncology History Overview Note  Breast cancer (Right breast)   Staging form: Breast, AJCC 7th Edition     Pathologic stage from 10/17/2014: Stage IA (T1b, N0, cM0) - Signed by [name], MD on 10/17/2014 Lung cancer (LLL)   Staging form: Lung, AJCC 7th Edition     Clinical stage from 12/14/2014: Stage IA (T1a, N0, M0) - Unsigned     Breast cancer of upper-outer quadrant of right female breast ([place])  05/04/2014 Initial Diagnosis   Breast cancer   05/19/2014 Imaging   Diagnostic mammogram and ultrasound showed a 7 mm irregular suspicious mass located in the right breast at the 10:00 position, 7 cm from the nipple. Otherwise negative.   05/19/2014 Initial Biopsy   Right breast biopsy (UOQ): IDC, grade 2.    05/19/2014 Receptors her2   ER 100% positive, PR 35% positive, HER-2 negative, Ki67 8%   06/07/2014 Miscellaneous   Genetic testing normal.  APC, ATM, BARD1, BMPR1A, BRCA1, BRCA2, BRIP1, CDH1, CDK4, CDKN2A, CHEK2, EPCAM, GREM1, MLH1, MRE11A, MSH2, MSH6, MUTYH, NBN, NF1, PALB2, PMS2, POLD1, POLE, PTEN, RAD50, RAD51D, SMAD4, SMARCA4, STK11, and TP53 tested.    07/23/2014 Surgery   Right breast lumpectomy with SLNB Lucia Gaskins) showed invasive ductal carcinoma & DCIS; negative surgical margins.   07/23/2014 Pathology Results   Invasive ductal carcinoma, tumor size 0.8 cm, grade 1, no lymphovascular invasion, margins were negative, 2 sentinel lymph nodes were  negative. (+) DCIS.   07/23/2014 Pathologic Stage   pT1b, pN0: Stage IA    09/16/2014 - 10/07/2014 Radiation Therapy   XRT completed Pablo Ledger). Right breast/ 42.72 Gy at 2.67 Gy per fraction x 21 fractions.     Anti-estrogen oral therapy   Aromasin prescribed in 01/2015 but Patient declines anti-estrogen therapy at this time d/t high co-pay of medication and possible side effects of AI therapy.    05/02/2015 Mammogram   Diagnostic TOMO bilat mammogram: No evidence of malignancy in either breast. Lumpectomy changes in right breast. Diagnostic mammo suggested in 1 year.    Primary cancer of left lower lobe of lung ([place])  10/25/2013 Imaging   CXR done for stroke protocol. No acute chest findings. Chronic lung disease with biapical scarring. Developing nodule in (L) apex cannot be excluded based on exam, athough may be d/t overlap of osseous structures.    10/27/2013 Imaging   CXR: Nodular opacity in mid (L) apex and appears more prominent than prior study. Area that appeared masslike in LUL near apex on recent CXR not seen at this time.  Underlying emphysema present. No edema or consolidation   11/12/2013 Imaging   CT chest: At left lung apex, there is asymmetric pleural parenchymal scarring. No discrete lesion.  In superior segment of LLL, there are 1 dominant ground-glass opacity measuring 16 mm. 2 addt'l ground-glass opacities appreciated. F/U with CT in 3 months   02/11/2014 Imaging   CT chest: Several small bilat faint nodular densities, largest measures 1.3 cm over superior segment LLL. No new nodules, no adenopathy. Cannot exclude metastatic disease  given pt's h/o melanoma. Recommend PET-CT   02/23/2014 PET scan   Persistent semi-solid nodule in superior segment LLL with low metabolic activity. Moderately metabolic subcarinal & hilar LN are likely reactive.    05/25/2014 Imaging   CT chest: Persistent & similar size dominant LLL sub-solid nodule. Adenocarcinoma is considered. Biopsy or  resection strongly considered. Additional scattered small ground-glass nodules stable.    06/02/2014 Imaging   MRI brain: Grossly stable appearance of left planum sphenoidale meningioma measuring 20 x 14 mm. Meningioma does not extend to left orbital apex & contacts left optic nerve. Stable right posterior clinoid meningioma .    09/27/2014 Imaging   CT chest: Further enlargment of central solid component in sub-solid LLL pulm nodule. Findings remain concerning for adenoca. Additional scattered small bilat ground-glass nodules and biapical scarring stable. No adenopathy.     11/25/2014 Surgery   Video bronchoscopy with endobronchial ultrasound; Video assisted thoracoscopy; Thoracoscopic LLL superior segmentectomy; Mediastinal lymph node dissection; Cyro intercostal nerve block Roxan Hockey)   11/25/2014 Pathology Results   Superior segment lung resection (LLL). Adenocarcinoma, well-diff, spanning 1.5 cm. Surgical margins negative. 5 LNs negative.    11/25/2014 Pathologic Stage   pT1a, pN0: Stage IA    11/25/2014 Initial Diagnosis   Primary cancer of left lower lobe of lung ([place])   05/31/2015 Imaging   CT chest: No evidence of local recurrent at superior segmentectomy in LLL. New small nodular focus of consolidation surrounding thin parenchymal band. Additional sub-cm ground-glass and solid nodules favored to be benign. No thoracic adenopathy   08/11/2015 Imaging   CT chest: No evidence of residual or recurrent tumor in LLL. Stable small nodular focus in LLL with thin surrounding parenchymal band, favored to be benign.  Multiple small ground-glass nodules unchanged.    06/19/2017 Pathology Results   Diagnosis 1. Lung, biopsy, Left lower lobe - ADENOCARCINOMA. - SEE COMMENT. 2. Lung, biopsy, Left lower lobe - BENIGN LUNG PARENCHYMA. - THERE IS NO EVIDENCE OF MALIGNANCY.   07/09/2017 - 07/22/2017 Radiation Therapy   Radiation treatment dates: 07/09/17-07/22/17 by Dr. Lisbeth Renshaw    Site/dose:  Left  lung/ 50 Gy in 10 fractions   Beams/energy:  IMRT/ 6X   Narrative: The patient tolerated radiation treatment relatively well. She denied pain, fatigue, or difficulty swallowing. Skin to the treatment area appears warm, dry, and normal in color.      CURRENT THERAPY:  Breast cancer surveillance  INTERVAL HISTORY:  Melissa Snyder is here for a follow up of right breast cancer. She was last seen by me in 2016 and loss follow up afterward. She was referred back by her PCP. She saw NP Wilber Bihari in 2019. She notes she is doing well overall. She notes she drover herself here as her husband is running errands. She notes she never took exemestane due to cost and side effects which could effect her eyes.  She notes she has been having a dull HA and balance issues for months and will see Dr Salomon Fick about this on 1/4. She plans to have brain MRI next week. She notes 3-4 years ago she had severe HA and was found to her a brain tumor and was following Dr. Salomon Fick since.  She notes in 2018 she broke her hip from iced over ground. She required partial hip replacement. She is able to ambulate independently. She notes her weight is currently stable since prior cancer treatments. She denies GI issues.  I reviewed her med list with her, she is no  longer taking Fosamax or Senokot.     REVIEW OF SYSTEMS:   Constitutional: Denies fevers, chills or abnormal weight loss (+) dull diffuse HA, balance issues  Eyes: Denies blurriness of vision Ears, nose, mouth, throat, and face: Denies mucositis or sore throat Respiratory: Denies cough, dyspnea or wheezes Cardiovascular: Denies palpitation, chest discomfort or lower extremity swelling Gastrointestinal:  Denies nausea, heartburn or change in bowel habits Skin: Denies abnormal skin rashes Lymphatics: Denies new lymphadenopathy or easy bruising Neurological:Denies numbness, tingling or new weaknesses Behavioral/Psych: Mood is stable, no new changes  All other  systems were reviewed with the patient and are negative.  MEDICAL HISTORY:  Past Medical History:  Diagnosis Date  . Adenocarcinoma ([place])    recurrent/notes 07/03/2017  . Blind left eye 10/2013   cva  . Breast cancer ([place]) 05/2014   ER+/PR+/Her2-     LUNG CANCER  . Bundle branch block left   . Chest pain   . Family history of bladder cancer   . Family history of breast cancer   . Family history of prostate cancer   . GERD (gastroesophageal reflux disease)   . History of blood transfusion    age 35  . Hyperlipidemia   . Hypertension   . Hypothyroid   . Lung nodule   . Melanoma in situ of face ([place]) JUNE 2015   LEFT CHEEK  . Meningioma ([place])    brain lining  . Migraine    reports only having one  . Osteoarthritis   . Personal history of radiation therapy   . Radiation 09/16/14-10/07/14   Right  Breast  21 fractions  . Skin cancer   . Stroke [facility]) 2015   Blind in left eye as a result    SURGICAL HISTORY: Past Surgical History:  Procedure Laterality Date  . ABDOMINAL HYSTERECTOMY  ~ 1997  . APPENDECTOMY     age 11  . BILATERAL SALPINGOOPHORECTOMY    . BLADDER REPAIR  2009   cystocele  . BRAIN MENINGIOMA EXCISION     Gamma knife to Meningioma in brain lining  . BREAST BIOPSY    . BREAST LUMPECTOMY Right   . BUNIONECTOMY     bilateral great toe  . CARDIAC CATHETERIZATION  2004  . CHOLECYSTECTOMY  ~ 1997  . COLONOSCOPY    . CRYO INTERCOSTAL NERVE BLOCK Left 11/25/2014   Procedure: CRYO INTERCOSTAL NERVE BLOCK;  Surgeon: [name], MD;  Location: [facility];  Service: Thoracic;  Laterality: Left;  . CT RADIATION THERAPY GUIDE     Gamma radiation -lt frontal-[facility]  . DILATION AND CURETTAGE OF UTERUS     X 2  . HIP ARTHROPLASTY Left 07/04/2017   Procedure: ARTHROPLASTY BIPOLAR HIP (HEMIARTHROPLASTY);  Surgeon: Tania Ade, MD;  Location: [facility];  Service: Orthopedics;  Laterality: Left;  Marland Kitchen MELANOMA EXCISION Left 11/05/23, 11/30/13   CHEEK  . SEGMENTECOMY Left  11/25/2014   Procedure: LEFT LOWER LOBE SEGMENTECTOMY;  Surgeon: [name], MD;  Location: [facility];  Service: Thoracic;  Laterality: Left;  Marland Kitchen VIDEO ASSISTED THORACOSCOPY Left 11/25/2014   Procedure: VIDEO ASSISTED THORACOSCOPY;  Surgeon: [name], MD;  Location: [facility];  Service: Thoracic;  Laterality: Left;  Marland Kitchen VIDEO BRONCHOSCOPY WITH ENDOBRONCHIAL NAVIGATION N/A 06/19/2017   Procedure: VIDEO BRONCHOSCOPY;  Surgeon: [name], MD;  Location: [facility];  Service: Thoracic;  Laterality: N/A;  . VIDEO BRONCHOSCOPY WITH ENDOBRONCHIAL ULTRASOUND N/A 11/25/2014   Procedure: VIDEO BRONCHOSCOPY WITH ENDOBRONCHIAL ULTRASOUND;  Surgeon: Remo Lipps  Chaya Jan, MD;  Location: [facility];  Service: Thoracic;  Laterality: N/A;    I have reviewed the social history and family history with the patient and they are unchanged from previous note.  ALLERGIES:  is allergic to beef-derived products; other; chocolate; ibuprofen; niacin and related; and tramadol.  MEDICATIONS:  Current Outpatient Medications  Medication Sig Dispense Refill  . atenolol (TENORMIN) 50 MG tablet Take 50 mg by mouth at bedtime.     Marland Kitchen b complex vitamins capsule Take 1 capsule by mouth daily.    . calcium-vitamin D (OSCAL WITH D) 500-200 MG-UNIT per tablet Take 1 tablet by mouth at bedtime.     . Cholecalciferol (VITAMIN D3) 1000 units CAPS Take by mouth daily.    . Digestive Enzymes (ENZYME DIGEST PO) Take 5 mLs by mouth 3 (three) times daily with meals.    . fish oil-omega-3 fatty acids 1000 MG capsule Take 1 g by mouth daily.     Marland Kitchen levothyroxine (SYNTHROID, LEVOTHROID) 112 MCG tablet Take 112 mcg by mouth daily before breakfast.     . lisinopril (PRINIVIL,ZESTRIL) 10 MG tablet Take 1 tablet (10 mg total) by mouth daily. (Patient taking differently: Take 10 mg by mouth 2 (two) times daily. ) 30 tablet 1  . meclizine (ANTIVERT) 12.5 MG tablet Take 12.5 mg by mouth 3 (three) times daily as needed for dizziness.    .  Multiple Vitamin (MULTIVITAMIN) tablet Take 1 tablet by mouth every morning.     . Polyvinyl Alcohol-Povidone (REFRESH OP) Apply 1 drop to eye as needed (DRY EYES).     . Triamcinolone Acetonide (NASACORT AQ NA) Place 1 spray into the nose at bedtime as needed (CONGESTION).    Marland Kitchen vitamin E (E-400) 400 UNIT capsule Take 400 Units by mouth every Monday, Wednesday, and Friday. Mon Wed Fri     No current facility-administered medications for this visit.    PHYSICAL EXAMINATION: ECOG PERFORMANCE STATUS: 1 - Symptomatic but completely ambulatory  Vitals:   06/02/19 1301  BP: (!) 155/70  Pulse: 74  Resp: 17  Temp: 98.7 F (37.1 C)  SpO2: 99%   Filed Weights   06/02/19 1301  Weight: 125 lb 6.4 oz (56.9 kg)    GENERAL:alert, no distress and comfortable SKIN: skin color, texture, turgor are normal, no rashes or significant lesions EYES: normal, Conjunctiva are pink and non-injected, sclera clear  NECK: supple, thyroid normal size, non-tender, without nodularity LYMPH:  no palpable lymphadenopathy in the cervical, axillary  LUNGS: clear to auscultation and percussion with normal breathing effort HEART: regular rate & rhythm and no murmurs and no lower extremity edema ABDOMEN:abdomen soft, non-tender and normal bowel sounds Musculoskeletal:no cyanosis of digits and no clubbing  NEURO: alert & oriented x 3 with fluent speech, no focal motor/sensory deficits BREAST: S/p right lumpectomy: Surgical incision healed well with mild scar tissue. No palpable mass, nodules or adenopathy bilaterally. Breast exam benign.   LABORATORY DATA:  I have reviewed the data as listed CBC Latest Ref Rng & Units 07/09/2017 07/06/2017 07/05/2017  WBC - 6.1 7.3 6.2  Hemoglobin 12.0 - 16.0 10.9(A) 10.4(L) 10.9(L)  Hematocrit 36 - 46 33(A) 31.4(L) 33.5(L)  Platelets 150 - 399 218 137(L) 137(L)     CMP Latest Ref Rng & Units 10/03/2018 03/14/2018 07/09/2017  Glucose 65 - 99 mg/dL - - -  BUN 8 - 23 mg/dL '18 14 15    ' Creatinine 0.44 - 1.00 mg/dL 0.74 0.69 0.6  Sodium 137 - 147 - -  136(A)  Potassium 3.4 - 5.3 - - 3.7  Chloride 101 - 111 mmol/L - - -  CO2 22 - 32 mmol/L - - -  Calcium 8.9 - 10.3 mg/dL - - -  Total Protein 6.5 - 8.1 g/dL - - -  Total Bilirubin 0.3 - 1.2 mg/dL - - -  Alkaline Phos 38 - 126 U/L - - -  AST 15 - 41 U/L - - -  ALT 14 - 54 U/L - - -      RADIOGRAPHIC STUDIES: I have personally reviewed the radiological images as listed and agreed with the findings in the report. No results found.   ASSESSMENT & PLAN:  CERYS WINGET is a 85 y.o. female with    1. Right breast invasive ductal carcinoma, pT1b N0 M0 stage IA, ER positive PR positive and HER-2 negative. -She was diagnosed in 05/2014. She is s/p right lumpectomy and adjuvant radiation.  -I recommended her antiestrogen with Exemestane in 2016 and she declined at that time due to cost and concern with side effects. Will continue with breast cancer surveillance.  -She is clinically doing well. Lab reviewed from this month with PCP. Her physical exam and her 06/2018 mammogram were unremarkable. There is no clinical concern for recurrence. -Continue Surveillance. Next mammogram set for 06/24/19 -F/u with me in 11-12 months. Continue to f/u with Dr. Lucia Gaskins in 10/2019   2. Stage I left lung adenocarcinoma, pT1aN0M0, recurrent LLL lung adenocarcinoma in 06/2017  -initially diagnosed in 11/2014. Her left lower lobe segmentectomy showed stage I adenocarcinoma -Given the early stage of lung cancer, she does not need adjuvant chemotherapy. -She had cancer recurrence in LLL in 06/2017. She was treated with radiation in 07/2017 with Dr. Lisbeth Renshaw.  -She will follow-up with Dr. Roxan Hockey and Dr. Lisbeth Renshaw for surveillance. -she is due for surveillance CT scan, I will schedule CT Chest scan in 2-3 weeks. I will call her with results.    3. Dull HA and unstable balance  -This has been going on for 1 month. She plans to have Brain MRI and see Dr.  Salomon Fick next week  -She notes she had HA's before and was found to have benign brain tumor. She has f/u with Dr Salomon Fick since.    4. She'll continue follow-up with her primary care physician and other specialists for her other medical issues. -She is s/p partial hip replacement due to fall on ice in 2018. Ambulates adequately.    Plan: -CT Chest in 2-3 weeks for lung cancer surveillance, I will call her with results  -Mammogram on 06/24/19, scheduled  -Lab and F/u in 11-12 month    No problem-specific Assessment & Plan notes found for this encounter.   Orders Placed This Encounter  Procedures  . CT Chest Wo Contrast    Standing Status:   Future    Standing Expiration Date:   06/01/2020    Order Specific Question:   Preferred imaging location?    Answer:   [id number]    Order Specific Question:   Radiology Contrast Protocol - do NOT remove file path    Answer:   [email or web address]   All questions were answered. The patient knows to call the clinic with any problems, questions or concerns. No barriers to learning was detected. I spent 20 minutes counseling the patient face to face. The total time spent in the appointment was 25 minutes and more than 50% was on counseling and review of test results  [name], MD 06/02/2019   I, Joslyn Devon, am acting as scribe for [name], MD.   I have reviewed the above documentation for accuracy and completeness, and I agree with the above.

## 2019-06-02 ENCOUNTER — Encounter: Payer: Self-pay | Admitting: Hematology

## 2019-06-02 ENCOUNTER — Other Ambulatory Visit: Payer: Self-pay

## 2019-06-02 ENCOUNTER — Inpatient Hospital Stay: Payer: Medicare HMO | Attending: Hematology | Admitting: Hematology

## 2019-06-02 VITALS — BP 155/70 | HR 74 | Temp 98.7°F | Resp 17 | Ht 65.25 in | Wt 125.4 lb

## 2019-06-02 DIAGNOSIS — Z79899 Other long term (current) drug therapy: Secondary | ICD-10-CM | POA: Diagnosis not present

## 2019-06-02 DIAGNOSIS — R519 Headache, unspecified: Secondary | ICD-10-CM | POA: Diagnosis not present

## 2019-06-02 DIAGNOSIS — Z853 Personal history of malignant neoplasm of breast: Secondary | ICD-10-CM | POA: Diagnosis not present

## 2019-06-02 DIAGNOSIS — Z170 Estrogen receptor positive status [ER+]: Secondary | ICD-10-CM

## 2019-06-02 DIAGNOSIS — C50411 Malignant neoplasm of upper-outer quadrant of right female breast: Secondary | ICD-10-CM

## 2019-06-02 DIAGNOSIS — C3432 Malignant neoplasm of lower lobe, left bronchus or lung: Secondary | ICD-10-CM | POA: Insufficient documentation

## 2019-06-02 DIAGNOSIS — Z923 Personal history of irradiation: Secondary | ICD-10-CM | POA: Diagnosis not present

## 2019-06-03 ENCOUNTER — Telehealth: Payer: Self-pay | Admitting: Hematology

## 2019-06-03 NOTE — Telephone Encounter (Signed)
Scheduled appt per 12/29 los.  Sent a message to HIM pool to get a calendar mailed out.

## 2019-06-04 DIAGNOSIS — D320 Benign neoplasm of cerebral meninges: Secondary | ICD-10-CM | POA: Diagnosis not present

## 2019-06-08 DIAGNOSIS — Z9889 Other specified postprocedural states: Secondary | ICD-10-CM | POA: Diagnosis not present

## 2019-06-08 DIAGNOSIS — D329 Benign neoplasm of meninges, unspecified: Secondary | ICD-10-CM | POA: Diagnosis not present

## 2019-06-18 ENCOUNTER — Ambulatory Visit
Admission: RE | Admit: 2019-06-18 | Discharge: 2019-06-18 | Disposition: A | Discharge status: Home / Self Care | Payer: Medicare HMO | Source: Ambulatory Visit | Attending: Hematology | Admitting: Hematology

## 2019-06-18 ENCOUNTER — Other Ambulatory Visit: Payer: Self-pay

## 2019-06-18 ENCOUNTER — Telehealth: Payer: Self-pay | Admitting: Radiation Oncology

## 2019-06-18 DIAGNOSIS — C50411 Malignant neoplasm of upper-outer quadrant of right female breast: Secondary | ICD-10-CM

## 2019-06-18 DIAGNOSIS — C3432 Malignant neoplasm of lower lobe, left bronchus or lung: Secondary | ICD-10-CM | POA: Diagnosis not present

## 2019-06-18 DIAGNOSIS — Z170 Estrogen receptor positive status [ER+]: Secondary | ICD-10-CM

## 2019-06-18 NOTE — Telephone Encounter (Signed)
error 

## 2019-06-24 ENCOUNTER — Other Ambulatory Visit: Payer: Self-pay

## 2019-06-24 ENCOUNTER — Ambulatory Visit
Admission: RE | Admit: 2019-06-24 | Discharge: 2019-06-24 | Disposition: A | Discharge status: Home / Self Care | Payer: Medicare HMO | Source: Ambulatory Visit | Attending: Internal Medicine | Admitting: Internal Medicine

## 2019-06-24 ENCOUNTER — Telehealth: Payer: Self-pay

## 2019-06-24 DIAGNOSIS — R928 Other abnormal and inconclusive findings on diagnostic imaging of breast: Secondary | ICD-10-CM | POA: Diagnosis not present

## 2019-06-24 DIAGNOSIS — Z9889 Other specified postprocedural states: Secondary | ICD-10-CM

## 2019-06-24 NOTE — Telephone Encounter (Signed)
Ms Bors left vm requesting results of her 06/18/2019 CT scan.

## 2019-06-25 ENCOUNTER — Other Ambulatory Visit: Payer: Self-pay | Admitting: Hematology

## 2019-06-25 ENCOUNTER — Other Ambulatory Visit: Payer: Self-pay | Admitting: *Deleted

## 2019-06-25 DIAGNOSIS — Z170 Estrogen receptor positive status [ER+]: Secondary | ICD-10-CM

## 2019-06-25 DIAGNOSIS — C50411 Malignant neoplasm of upper-outer quadrant of right female breast: Secondary | ICD-10-CM

## 2019-06-25 NOTE — Progress Notes (Signed)
Awaiting call back.

## 2019-06-25 NOTE — Progress Notes (Signed)
The proposed treatment discussed in cancer conference 1/21/21is for discussion purpose only and is not a binding recommendation.  The patient was not physically examined nor present for their treatment options.  Therefore, final treatment plans cannot be decided.

## 2019-06-25 NOTE — Telephone Encounter (Signed)
Please see my result note from this morning and call her back, thanks   [name] MD

## 2019-06-26 ENCOUNTER — Telehealth: Payer: Self-pay

## 2019-06-26 NOTE — Telephone Encounter (Signed)
Per Dr. Burr Medico informed patient of CT results, explained that the recommendation is to have a PET scan, it is scheduled for 2/2 to arrive at 9:30 am at [facility], NPO 6 hours prior.  She verbalized an understanding. She was given an appointment for a phone visit on 2/4 with Dr. Burr Medico. She also requested a copy of CT report be mailed to her home address today and I have done this.

## 2019-07-03 NOTE — Progress Notes (Signed)
[name]   [phone] [phone]   Clinic Follow up Note   Patient Care Team: [name], MD as PCP - General (Internal Medicine) [name], MD as Consulting Physician (Cardiothoracic Surgery) [name], MD as Consulting Physician (Urology) [name], MD as Consulting Physician (Hematology) [name], MD as Consulting Physician (Radiation Oncology) Hayden Pedro, PA-C as Physician Assistant (Radiation Oncology)  Date of Service:  07/09/2019   CHIEF COMPLAINT: Follow up right breast cancer, review PET scan   SUMMARY OF ONCOLOGIC HISTORY: Oncology History Overview Note  Breast cancer (Right breast)   Staging form: Breast, AJCC 7th Edition     Pathologic stage from 10/17/2014: Stage IA (T1b, N0, cM0) - Signed by [name], MD on 10/17/2014 Lung cancer (LLL)   Staging form: Lung, AJCC 7th Edition     Clinical stage from 12/14/2014: Stage IA (T1a, N0, M0) - Unsigned     Breast cancer of upper-outer quadrant of right female breast (Melissa Snyder)  05/04/2014 Initial Diagnosis   Breast cancer   05/19/2014 Imaging   Diagnostic mammogram and ultrasound showed a 7 mm irregular suspicious mass located in the right breast at the 10:00 position, 7 cm from the nipple. Otherwise negative.   05/19/2014 Initial Biopsy   Right breast biopsy (UOQ): IDC, grade 2.    05/19/2014 Receptors her2   ER 100% positive, PR 35% positive, HER-2 negative, Ki67 8%   06/07/2014 Miscellaneous   Genetic testing normal.  APC, ATM, BARD1, BMPR1A, BRCA1, BRCA2, BRIP1, CDH1, CDK4, CDKN2A, CHEK2, EPCAM, GREM1, MLH1, MRE11A, MSH2, MSH6, MUTYH, NBN, NF1, PALB2, PMS2, POLD1, POLE, PTEN, RAD50, RAD51D, SMAD4, SMARCA4, STK11, and TP53 tested.    07/23/2014 Surgery   Right breast lumpectomy with SLNB Melissa Snyder) showed invasive ductal carcinoma & DCIS; negative surgical margins.   07/23/2014 Pathology Results   Invasive ductal carcinoma, tumor size 0.8  cm, grade 1, no lymphovascular invasion, margins were negative, 2 sentinel lymph nodes were negative. (+) DCIS.   07/23/2014 Pathologic Stage   pT1b, pN0: Stage IA    09/16/2014 - 10/07/2014 Radiation Therapy   XRT completed Pablo Ledger). Right breast/ 42.72 Gy at 2.67 Gy per fraction x 21 fractions.     Anti-estrogen oral therapy   Aromasin prescribed in 01/2015 but Patient declines anti-estrogen therapy at this time d/t high co-pay of medication and possible side effects of AI therapy.    05/02/2015 Mammogram   Diagnostic TOMO bilat mammogram: No evidence of malignancy in either breast. Lumpectomy changes in right breast. Diagnostic mammo suggested in 1 year.    Primary cancer of left lower lobe of lung (Melissa Snyder)  10/25/2013 Imaging   CXR done for stroke protocol. No acute chest findings. Chronic lung disease with biapical scarring. Developing nodule in (L) apex cannot be excluded based on exam, athough may be d/t overlap of osseous structures.    10/27/2013 Imaging   CXR: Nodular opacity in mid (L) apex and appears more prominent than prior study. Area that appeared masslike in LUL near apex on recent CXR not seen at this time.  Underlying emphysema present. No edema or consolidation   11/12/2013 Imaging   CT chest: At left lung apex, there is asymmetric pleural parenchymal scarring. No discrete lesion.  In superior segment of LLL, there are 1 dominant ground-glass opacity measuring 16 mm. 2 addt'l ground-glass opacities appreciated. F/U with CT in 3 months   02/11/2014 Imaging   CT chest: Several small bilat faint nodular densities, largest measures 1.3 cm  over superior segment LLL. No new nodules, no adenopathy. Cannot exclude metastatic disease given pt's h/o melanoma. Recommend PET-CT   02/23/2014 PET scan   Persistent semi-solid nodule in superior segment LLL with low metabolic activity. Moderately metabolic subcarinal & hilar LN are likely reactive.    05/25/2014 Imaging   CT chest:  Persistent & similar size dominant LLL sub-solid nodule. Adenocarcinoma is considered. Biopsy or resection strongly considered. Additional scattered small ground-glass nodules stable.    06/02/2014 Imaging   MRI brain: Grossly stable appearance of left planum sphenoidale meningioma measuring 20 x 14 mm. Meningioma does not extend to left orbital apex & contacts left optic nerve. Stable right posterior clinoid meningioma .    09/27/2014 Imaging   CT chest: Further enlargment of central solid component in sub-solid LLL pulm nodule. Findings remain concerning for adenoca. Additional scattered small bilat ground-glass nodules and biapical scarring stable. No adenopathy.     11/25/2014 Surgery   Video bronchoscopy with endobronchial ultrasound; Video assisted thoracoscopy; Thoracoscopic LLL superior segmentectomy; Mediastinal lymph node dissection; Cyro intercostal nerve block Roxan Hockey)   11/25/2014 Pathology Results   Superior segment lung resection (LLL). Adenocarcinoma, well-diff, spanning 1.5 cm. Surgical margins negative. 5 LNs negative.    11/25/2014 Pathologic Stage   pT1a, pN0: Stage IA    11/25/2014 Initial Diagnosis   Primary cancer of left lower lobe of lung (Melissa Snyder)   05/31/2015 Imaging   CT chest: No evidence of local recurrent at superior segmentectomy in LLL. New small nodular focus of consolidation surrounding thin parenchymal band. Additional sub-cm ground-glass and solid nodules favored to be benign. No thoracic adenopathy   08/11/2015 Imaging   CT chest: No evidence of residual or recurrent tumor in LLL. Stable small nodular focus in LLL with thin surrounding parenchymal band, favored to be benign.  Multiple small ground-glass nodules unchanged.    06/19/2017 Pathology Results   Diagnosis 1. Lung, biopsy, Left lower lobe - ADENOCARCINOMA. - SEE COMMENT. 2. Lung, biopsy, Left lower lobe - BENIGN LUNG PARENCHYMA. - THERE IS NO EVIDENCE OF MALIGNANCY.   07/09/2017 - 07/22/2017  Radiation Therapy   Radiation treatment dates: 07/09/17-07/22/17 by Dr. Lisbeth Renshaw    Site/dose:  Left lung/ 50 Gy in 10 fractions   Beams/energy:  IMRT/ 6X   Narrative: The patient tolerated radiation treatment relatively well. She denied pain, fatigue, or difficulty swallowing. Skin to the treatment area appears warm, dry, and normal in color.   06/18/2019 Imaging   CT Chest  IMPRESSION: 1. There are 2 round solid nodules within the left lower lobe which have increased in size when compared with the previous exam, suspicious. Additionally, along the area of postsurgical change around the oblique fissure of the left lung there is progressive thickening with persistent loculated fluid. Cannot rule out local tumor recurrence along the suture line. 2. No significant change in the appearance of bilateral upper lobe sub solid lung nodules. 3. Aortic Atherosclerosis (ICD10-I70.0). Coronary artery calcifications.     07/07/2019 PET scan   IMPRESSION: 1. No evidence of hypermetabolic local tumor recurrence in the left lung. Low level nonfocal uptake along the segmentectomy site in the left lower lobe favors post treatment change. Continued chest CT surveillance warranted. 2. Two small solid left lower lobe pulmonary nodules, largest 0.7 cm, below PET resolution. Given the growth of these nodules since 10/03/2018 chest CT, metastatic disease remains on the differential. Close chest CT surveillance recommended. 3. Nonspecific hypermetabolism within nonenlarged subcarinal and bilateral hilar lymph nodes, not substantially changed  since 03/04/2017 PET-CT study, favoring reactive uptake. 4. No hypermetabolic distant metastatic disease. 5. Chronic findings include: Aortic Atherosclerosis (ICD10-I70.0). Subcentimeter ground-glass pulmonary nodules in the upper lobes are unchanged and are below PET resolution. Moderate sigmoid diverticulosis. Chronic right maxillary sinusitis.      CURRENT  THERAPY:  Surveillance   INTERVAL HISTORY:  JUNICE FEI is here for a follow up. She presents to the clinic alone. She notes she is doing well.  She notes soreness of lateral left ribcage. She notes this is around her incision. She notes this has been there since surgery.  She notes she has low appetite but has been able to maintain her weight. She has been taking protein drink.    REVIEW OF SYSTEMS:   Constitutional: Denies fevers, chills (+) Low appetite, stable weight  Eyes: Denies blurriness of vision Ears, nose, mouth, throat, and face: Denies mucositis or sore throat Respiratory: Denies cough, dyspnea or wheezes (+) Left lateral ribcage soreness s/p surgery  Cardiovascular: Denies palpitation, chest discomfort or lower extremity swelling Gastrointestinal:  Denies nausea, heartburn or change in bowel habits Skin: Denies abnormal skin rashes Lymphatics: Denies new lymphadenopathy or easy bruising Neurological:Denies numbness, tingling or new weaknesses Behavioral/Psych: Mood is stable, no new changes  All other systems were reviewed with the patient and are negative.  MEDICAL HISTORY:  Past Medical History:  Diagnosis Date  . Adenocarcinoma ([place])    recurrent/notes 07/03/2017  . Blind left eye 10/2013   cva  . Breast cancer ([place]) 05/2014   ER+/PR+/Her2-     LUNG CANCER  . Bundle branch block left   . Chest pain   . Family history of bladder cancer   . Family history of breast cancer   . Family history of prostate cancer   . GERD (gastroesophageal reflux disease)   . History of blood transfusion    age 86  . Hyperlipidemia   . Hypertension   . Hypothyroid   . Lung nodule   . Melanoma in situ of face ([place]) JUNE 2015   LEFT CHEEK  . Meningioma ([place])    brain lining  . Migraine    reports only having one  . Osteoarthritis   . Personal history of radiation therapy   . Radiation 09/16/14-10/07/14   Right  Breast  21 fractions  . Skin cancer   . Stroke [facility]) 2015    Blind in left eye as a result    SURGICAL HISTORY: Past Surgical History:  Procedure Laterality Date  . ABDOMINAL HYSTERECTOMY  ~ 1997  . APPENDECTOMY     age 56  . BILATERAL SALPINGOOPHORECTOMY    . BLADDER REPAIR  2009   cystocele  . BRAIN MENINGIOMA EXCISION     Gamma knife to Meningioma in brain lining  . BREAST BIOPSY    . BREAST LUMPECTOMY Right 07/23/2014   invasive ductal  . BUNIONECTOMY     bilateral great toe  . CARDIAC CATHETERIZATION  2004  . CHOLECYSTECTOMY  ~ 1997  . COLONOSCOPY    . CRYO INTERCOSTAL NERVE BLOCK Left 11/25/2014   Procedure: CRYO INTERCOSTAL NERVE BLOCK;  Surgeon: [name], MD;  Location: [place];  Service: Thoracic;  Laterality: Left;  . CT RADIATION THERAPY GUIDE     Gamma radiation -lt frontal-[facility]  . DILATION AND CURETTAGE OF UTERUS     X 2  . HIP ARTHROPLASTY Left 07/04/2017   Procedure: ARTHROPLASTY BIPOLAR HIP (HEMIARTHROPLASTY);  Surgeon: Tania Ade, MD;  Location: [place] [place];  Service: Orthopedics;  Laterality: Left;  Marland Kitchen MELANOMA EXCISION Left 11/05/23, 11/30/13   CHEEK  . SEGMENTECOMY Left 11/25/2014   Procedure: LEFT LOWER LOBE SEGMENTECTOMY;  Surgeon: [name], MD;  Location: [place];  Service: Thoracic;  Laterality: Left;  Marland Kitchen VIDEO ASSISTED THORACOSCOPY Left 11/25/2014   Procedure: VIDEO ASSISTED THORACOSCOPY;  Surgeon: [name], MD;  Location: [place];  Service: Thoracic;  Laterality: Left;  Marland Kitchen VIDEO BRONCHOSCOPY WITH ENDOBRONCHIAL NAVIGATION N/A 06/19/2017   Procedure: VIDEO BRONCHOSCOPY;  Surgeon: [name], MD;  Location: [facility];  Service: Thoracic;  Laterality: N/A;  . VIDEO BRONCHOSCOPY WITH ENDOBRONCHIAL ULTRASOUND N/A 11/25/2014   Procedure: VIDEO BRONCHOSCOPY WITH ENDOBRONCHIAL ULTRASOUND;  Surgeon: [name], MD;  Location: [place];  Service: Thoracic;  Laterality: N/A;    I have reviewed the social history and family history with the patient and they are unchanged from previous  note.  ALLERGIES:  is allergic to beef-derived products; other; chocolate; ibuprofen; niacin and related; and tramadol.  MEDICATIONS:  Current Outpatient Medications  Medication Sig Dispense Refill  . atenolol (TENORMIN) 50 MG tablet Take 50 mg by mouth at bedtime.     Marland Kitchen b complex vitamins capsule Take 1 capsule by mouth daily.    . calcium-vitamin D (OSCAL WITH D) 500-200 MG-UNIT per tablet Take 1 tablet by mouth at bedtime.     . Cholecalciferol (VITAMIN D3) 1000 units CAPS Take by mouth daily.    . Digestive Enzymes (ENZYME DIGEST PO) Take 5 mLs by mouth 3 (three) times daily with meals.    . fish oil-omega-3 fatty acids 1000 MG capsule Take 1 g by mouth daily.     Marland Kitchen levothyroxine (SYNTHROID, LEVOTHROID) 112 MCG tablet Take 112 mcg by mouth daily before breakfast.     . lisinopril (PRINIVIL,ZESTRIL) 10 MG tablet Take 1 tablet (10 mg total) by mouth daily. (Patient taking differently: Take 10 mg by mouth 2 (two) times daily. ) 30 tablet 1  . meclizine (ANTIVERT) 12.5 MG tablet Take 12.5 mg by mouth 3 (three) times daily as needed for dizziness.    . Multiple Vitamin (MULTIVITAMIN) tablet Take 1 tablet by mouth every morning.     . Polyvinyl Alcohol-Povidone (REFRESH OP) Apply 1 drop to eye as needed (DRY EYES).     . Triamcinolone Acetonide (NASACORT AQ NA) Place 1 spray into the nose at bedtime as needed (CONGESTION).    Marland Kitchen vitamin E (E-400) 400 UNIT capsule Take 400 Units by mouth every Monday, Wednesday, and Friday. Mon Wed Fri    . Omega-3 1000 MG CAPS Take 1 g by mouth daily.     No current facility-administered medications for this visit.    PHYSICAL EXAMINATION: ECOG PERFORMANCE STATUS: 1 - Symptomatic but completely ambulatory  Vitals:   07/09/19 1201  BP: (!) 167/80  Pulse: 70  Resp: 17  Temp: 98 F (36.7 C)  SpO2: 100%   Filed Weights   07/09/19 1201  Weight: 124 lb 11.2 oz (56.6 kg)    Due to COVID19 we will limit examination to appearance. Patient had no  complaints.  GENERAL:alert, no distress and comfortable SKIN: skin color normal, no rashes or significant lesions EYES: normal, Conjunctiva are pink and non-injected, sclera clear  NEURO: alert & oriented x 3 with fluent speech   LABORATORY DATA:  I have reviewed the data as listed CBC Latest Ref Rng & Units 07/09/2017 07/06/2017 07/05/2017  WBC - 6.1 7.3 6.2  Hemoglobin 12.0 - 16.0  10.9(A) 10.4(L) 10.9(L)  Hematocrit 36 - 46 33(A) 31.4(L) 33.5(L)  Platelets 150 - 399 218 137(L) 137(L)     CMP Latest Ref Rng & Units 10/03/2018 03/14/2018 07/09/2017  Glucose 65 - 99 mg/dL - - -  BUN 8 - 23 mg/dL '18 14 15  ' Creatinine 0.44 - 1.00 mg/dL 0.74 0.69 0.6  Sodium 137 - 147 - - 136(A)  Potassium 3.4 - 5.3 - - 3.7  Chloride 101 - 111 mmol/L - - -  CO2 22 - 32 mmol/L - - -  Calcium 8.9 - 10.3 mg/dL - - -  Total Protein 6.5 - 8.1 g/dL - - -  Total Bilirubin 0.3 - 1.2 mg/dL - - -  Alkaline Phos 38 - 126 U/L - - -  AST 15 - 41 U/L - - -  ALT 14 - 54 U/L - - -      RADIOGRAPHIC STUDIES: I have personally reviewed the radiological images as listed and agreed with the findings in the report. No results found.   ASSESSMENT & PLAN:  Melissa Snyder is a 86 y.o. female with    1. Stage I left lung adenocarcinoma, pT1aN0M0, recurrent LLL lung adenocarcinoma in 06/2017, new lung nodules  -initially diagnosed in 11/2014. Her left lower lobe segmentectomy showed stage I adenocarcinoma -Given the early stage of lung cancer, she does not need adjuvant chemotherapy. -She had cancer recurrence in LLL in 06/2017. She was treated with radiation in 07/2017 with Dr. Lisbeth Renshaw.  -She does still have residual left lateral ribcage soreness, likely nerve damage from initial surgery.  -She will follow-up with Dr. Roxan Hockey for surveillance.  -I ordered a surveillance CT chest 06/18/19 which showed 2 left lung nodules have increased in size and mild fluid in left lung. We reviewed in thoracic tumor board, and recommend a  PET scan  -I personally reviewed and discussed her PET from 07/07/19 which shows no evidence of hypermetabolic local tumor recurrence in left lung and no hypermetabolic distant metastasis. Her left lung nodules are too small and probably below PET scan sensitivity, overall no high suspicion for recurrence.  -Will repeat CT chest scan in 3 months and f/u with Dr. Roxan Hockey.   2. Right breast invasive ductal carcinoma, pT1b N0 M0 stage IA, ER positive PR positive and HER-2 negative. -She was diagnosed in 05/2014. She is s/p right lumpectomy and adjuvant radiation.  -I recommended her antiestrogen with Exemestane in 2016 and she declined at that time due to cost and concern with side effects. Will continue with breast cancer surveillance.  -She is clinically stable from a breast standpoint. Her 06/2019 Mammogram was normal. There is no clinical concern for recurrence. -Continue Surveillance. Next mammogram set for 06/2020 -F/u with NP Lacie in 04/2020 months. Continue to f/u with Dr. Lucia Snyder in 10/2019   3. Dull Headache and unstable balance  -This has been going on for 1 month. She plans to have Brain MRI and see Dr. Salomon Fick next week  -She notes she had HA's before and was found to have benign brain tumor. She has f/u with Dr Salomon Fick since.   4. Low appetite  -She has lowered appetite but has been able to maintain weight so far.  -She has been taking protein supplements, I recommend Ensure or boost which has more calories.   5. She'll continue follow-up with her primary care physician and other specialists for her other medical issues. -She is s/p partial hip replacement due to fall on ice in 2018.  Ambulates adequately.    Plan: -PET scan reviewed, no high suspicion for lung cancer recurrence.  -CT Chest in 3 months and she will f/u with Dr. Roxan Hockey after scan, I will send a message  -Lab and F/u with NP Lacie in 04/2020 as previously scheduled    No problem-specific Assessment & Plan  notes found for this encounter.   Orders Placed This Encounter  Procedures  . CT Chest Wo Contrast    Standing Status:   Future    Standing Expiration Date:   07/08/2020    Order Specific Question:   Preferred imaging location?    Answer:   [facility]    Order Specific Question:   Radiology Contrast Protocol - do NOT remove file path    Answer:   [email or web address]   All questions were answered. The patient knows to call the clinic with any problems, questions or concerns. No barriers to learning was detected. The total time spent in the appointment was 25 minutes.    [name], MD 07/09/2019   I, Joslyn Devon, am acting as scribe for [name], MD.   I have reviewed the above documentation for accuracy and completeness, and I agree with the above.

## 2019-07-07 ENCOUNTER — Other Ambulatory Visit: Payer: Self-pay

## 2019-07-07 ENCOUNTER — Ambulatory Visit (HOSPITAL_COMMUNITY)
Admission: RE | Admit: 2019-07-07 | Discharge: 2019-07-07 | Disposition: A | Discharge status: Home / Self Care | Payer: Medicare HMO | Source: Ambulatory Visit | Attending: Hematology | Admitting: Hematology

## 2019-07-07 DIAGNOSIS — Z170 Estrogen receptor positive status [ER+]: Secondary | ICD-10-CM | POA: Insufficient documentation

## 2019-07-07 DIAGNOSIS — C3432 Malignant neoplasm of lower lobe, left bronchus or lung: Secondary | ICD-10-CM | POA: Diagnosis not present

## 2019-07-07 DIAGNOSIS — J320 Chronic maxillary sinusitis: Secondary | ICD-10-CM | POA: Diagnosis not present

## 2019-07-07 DIAGNOSIS — K5730 Diverticulosis of large intestine without perforation or abscess without bleeding: Secondary | ICD-10-CM | POA: Diagnosis not present

## 2019-07-07 DIAGNOSIS — R918 Other nonspecific abnormal finding of lung field: Secondary | ICD-10-CM | POA: Diagnosis not present

## 2019-07-07 DIAGNOSIS — I700 Atherosclerosis of aorta: Secondary | ICD-10-CM | POA: Insufficient documentation

## 2019-07-07 DIAGNOSIS — C50411 Malignant neoplasm of upper-outer quadrant of right female breast: Secondary | ICD-10-CM | POA: Diagnosis present

## 2019-07-07 LAB — GLUCOSE, CAPILLARY: Glucose-Capillary: 88 mg/dL (ref 70–99)

## 2019-07-07 MED ORDER — FLUDEOXYGLUCOSE F - 18 (FDG) INJECTION
6.2600 | Freq: Once | INTRAVENOUS | Status: AC | PRN
  Administered 2019-07-07: 6.26 via INTRAVENOUS

## 2019-07-09 ENCOUNTER — Other Ambulatory Visit: Payer: Self-pay

## 2019-07-09 ENCOUNTER — Encounter: Payer: Self-pay | Admitting: Hematology

## 2019-07-09 ENCOUNTER — Inpatient Hospital Stay: Payer: Medicare HMO | Attending: Hematology | Admitting: Hematology

## 2019-07-09 VITALS — BP 167/80 | HR 70 | Temp 98.0°F | Resp 17 | Ht 65.25 in | Wt 124.7 lb

## 2019-07-09 DIAGNOSIS — C3432 Malignant neoplasm of lower lobe, left bronchus or lung: Secondary | ICD-10-CM | POA: Diagnosis not present

## 2019-07-09 DIAGNOSIS — C50411 Malignant neoplasm of upper-outer quadrant of right female breast: Secondary | ICD-10-CM | POA: Insufficient documentation

## 2019-07-09 DIAGNOSIS — Z923 Personal history of irradiation: Secondary | ICD-10-CM | POA: Diagnosis not present

## 2019-07-09 DIAGNOSIS — Z170 Estrogen receptor positive status [ER+]: Secondary | ICD-10-CM | POA: Diagnosis not present

## 2019-07-09 DIAGNOSIS — Z79899 Other long term (current) drug therapy: Secondary | ICD-10-CM | POA: Insufficient documentation

## 2019-07-09 DIAGNOSIS — Z8673 Personal history of transient ischemic attack (TIA), and cerebral infarction without residual deficits: Secondary | ICD-10-CM | POA: Diagnosis not present

## 2019-07-10 ENCOUNTER — Telehealth: Payer: Self-pay

## 2019-07-10 ENCOUNTER — Telehealth: Payer: Self-pay | Admitting: Hematology

## 2019-07-10 NOTE — Telephone Encounter (Signed)
-----   Message from Jesse Fall, RN sent at 07/10/2019  9:52 AM EST ----- I called & left message for pt to call back & speak with Dr Ernestina Penna RN. ----- Message ----- From: [name], MD Sent: 07/09/2019   3:36 PM EST To: Jesse Fall, RN  [place],   Please let pt know Dr. Roxan Hockey wants her to see me after her next CT scan in 3 months. Please send a schedule message if she agrees.  Thanks  Krista Blue  ----- Message ----- From: [name], MD Sent: 07/09/2019   3:10 PM EST To: [name], MD  I think it is fine for her to just follow up with you as you will be seeing her anyway and if there is anything I can do just give me a call  Richardson Landry ----- Message ----- From: [name], MD Sent: 07/09/2019  12:34 PM EST To: [name], MD, Royston Bake, RN  Dr. Roxan Hockey,  Her PET was negative, so I ordered a CT chest to be done in 3 months. Do you want to see her back after her CT in 3 months? Since you have been monitoring her lung cancer. If not, let me know and I will see her back.   Thanks  Krista Blue

## 2019-07-10 NOTE — Telephone Encounter (Signed)
No los per 2/4. 

## 2019-07-10 NOTE — Telephone Encounter (Signed)
Melissa Snyder called. I let her know that Dr. Burr Medico order a CT scan to be done in 3 months.  Dr. Roxan Hockey wants Dr. Burr Medico to see patient after her CT scan.  She verbalized understanding.  Scheduling message sent.

## 2019-07-13 ENCOUNTER — Telehealth: Payer: Self-pay | Admitting: Hematology

## 2019-07-13 NOTE — Telephone Encounter (Signed)
I could not reach patient regarding schedule will mail

## 2019-08-06 ENCOUNTER — Ambulatory Visit: Payer: Medicare HMO | Attending: Internal Medicine

## 2019-08-06 DIAGNOSIS — Z23 Encounter for immunization: Secondary | ICD-10-CM | POA: Insufficient documentation

## 2019-08-06 NOTE — Progress Notes (Signed)
.     Covid-19 Vaccination Clinic  Name:  Melissa Snyder    MRN: [id number] DOB: [birth year]  08/06/2019  [name] was observed post Covid-19 immunization for 15 minutes without incident. She was provided with Vaccine Information Sheet and instruction to access the V-Safe system.   [name] was instructed to call 911 with any severe reactions post vaccine: Marland Kitchen Difficulty breathing  . Swelling of face and throat  . A fast heartbeat  . A bad rash all over body  . Dizziness and weakness   Immunizations Administered    Name Date Dose VIS Date Route   Pfizer COVID-19 Vaccine 08/06/2019  8:39 AM 0.3 mL 05/15/2019 Intramuscular   Manufacturer: [place]   Lot: [id number]   [place]: [address]-5

## 2019-08-26 DIAGNOSIS — E039 Hypothyroidism, unspecified: Secondary | ICD-10-CM | POA: Diagnosis not present

## 2019-08-26 DIAGNOSIS — R42 Dizziness and giddiness: Secondary | ICD-10-CM | POA: Diagnosis not present

## 2019-08-26 DIAGNOSIS — K219 Gastro-esophageal reflux disease without esophagitis: Secondary | ICD-10-CM | POA: Diagnosis not present

## 2019-08-26 DIAGNOSIS — E785 Hyperlipidemia, unspecified: Secondary | ICD-10-CM | POA: Diagnosis not present

## 2019-08-26 DIAGNOSIS — C3490 Malignant neoplasm of unspecified part of unspecified bronchus or lung: Secondary | ICD-10-CM | POA: Diagnosis not present

## 2019-08-26 DIAGNOSIS — M810 Age-related osteoporosis without current pathological fracture: Secondary | ICD-10-CM | POA: Diagnosis not present

## 2019-08-26 DIAGNOSIS — I10 Essential (primary) hypertension: Secondary | ICD-10-CM | POA: Diagnosis not present

## 2019-08-26 DIAGNOSIS — Z853 Personal history of malignant neoplasm of breast: Secondary | ICD-10-CM | POA: Diagnosis not present

## 2019-08-26 DIAGNOSIS — D329 Benign neoplasm of meninges, unspecified: Secondary | ICD-10-CM | POA: Diagnosis not present

## 2019-09-01 ENCOUNTER — Ambulatory Visit: Payer: Medicare HMO | Attending: Internal Medicine

## 2019-09-01 DIAGNOSIS — Z23 Encounter for immunization: Secondary | ICD-10-CM

## 2019-09-01 NOTE — Progress Notes (Signed)
   Covid-19 Vaccination Clinic  Name:  Melissa Snyder    MRN: [id number] DOB: 1934/02/13  09/01/2019  [name] was observed post Covid-19 immunization for 15 minutes without incident. She was provided with Vaccine Information Sheet and instruction to access the V-Safe system.   [name] was instructed to call 911 with any severe reactions post vaccine: Marland Kitchen Difficulty breathing  . Swelling of face and throat  . A fast heartbeat  . A bad rash all over body  . Dizziness and weakness   Immunizations Administered    Name Date Dose VIS Date Route   Pfizer COVID-19 Vaccine 09/01/2019  1:33 PM 0.3 mL 05/15/2019 Intramuscular   Manufacturer: [place]   Lot: [id number]   [place]: [id number]

## 2019-09-17 ENCOUNTER — Other Ambulatory Visit (HOSPITAL_COMMUNITY): Payer: Self-pay | Admitting: *Deleted

## 2019-09-18 ENCOUNTER — Ambulatory Visit (HOSPITAL_COMMUNITY)
Admission: RE | Admit: 2019-09-18 | Discharge: 2019-09-18 | Disposition: A | Discharge status: Home / Self Care | Payer: Medicare HMO | Source: Ambulatory Visit | Attending: Internal Medicine | Admitting: Internal Medicine

## 2019-09-18 ENCOUNTER — Other Ambulatory Visit: Payer: Self-pay

## 2019-09-18 DIAGNOSIS — M810 Age-related osteoporosis without current pathological fracture: Secondary | ICD-10-CM | POA: Insufficient documentation

## 2019-09-18 MED ORDER — DENOSUMAB 60 MG/ML ~~LOC~~ SOSY
60.0000 mg | PREFILLED_SYRINGE | Freq: Once | SUBCUTANEOUS | Status: AC
  Administered 2019-09-18: 60 mg via SUBCUTANEOUS

## 2019-09-18 MED ORDER — DENOSUMAB 60 MG/ML ~~LOC~~ SOSY
PREFILLED_SYRINGE | SUBCUTANEOUS | Status: AC
  Filled 2019-09-18: qty 1

## 2019-10-02 ENCOUNTER — Telehealth: Payer: Self-pay | Admitting: *Deleted

## 2019-10-02 NOTE — Telephone Encounter (Signed)
LM with note below. Appts cancelled

## 2019-10-02 NOTE — Telephone Encounter (Signed)
Yes, OK to cancel her appointment. Please let her know that I am appealing her CT denial with her insurance. Thanks   [name] MD

## 2019-10-02 NOTE — Telephone Encounter (Signed)
Melissa Snyder states her CT was cancelled because insurance denied it. Has follow up with Dr Burr Medico on 10/12/19. Wants to know if she should keep that appt.

## 2019-10-05 ENCOUNTER — Ambulatory Visit (HOSPITAL_COMMUNITY): Admission: RE | Admit: 2019-10-05 | Payer: Medicare HMO | Source: Ambulatory Visit

## 2019-10-06 ENCOUNTER — Telehealth: Payer: Self-pay

## 2019-10-06 NOTE — Telephone Encounter (Signed)
Melissa Snyder returned my call.  I reviewed her upcoming appts.  She opted for a phone visit on 5/13.

## 2019-10-12 ENCOUNTER — Other Ambulatory Visit: Payer: Medicare HMO

## 2019-10-12 ENCOUNTER — Ambulatory Visit: Payer: Medicare HMO | Admitting: Hematology

## 2019-10-13 ENCOUNTER — Other Ambulatory Visit: Payer: Self-pay | Admitting: *Deleted

## 2019-10-13 DIAGNOSIS — D62 Acute posthemorrhagic anemia: Secondary | ICD-10-CM

## 2019-10-14 ENCOUNTER — Inpatient Hospital Stay: Payer: Medicare HMO | Attending: Hematology

## 2019-10-14 ENCOUNTER — Encounter (HOSPITAL_COMMUNITY): Payer: Self-pay

## 2019-10-14 ENCOUNTER — Other Ambulatory Visit: Payer: Self-pay

## 2019-10-14 ENCOUNTER — Telehealth: Payer: Self-pay | Admitting: Hematology

## 2019-10-14 ENCOUNTER — Ambulatory Visit (HOSPITAL_COMMUNITY)
Admission: RE | Admit: 2019-10-14 | Discharge: 2019-10-14 | Disposition: A | Discharge status: Home / Self Care | Payer: Medicare HMO | Source: Ambulatory Visit | Attending: Hematology | Admitting: Hematology

## 2019-10-14 DIAGNOSIS — C3490 Malignant neoplasm of unspecified part of unspecified bronchus or lung: Secondary | ICD-10-CM | POA: Diagnosis not present

## 2019-10-14 DIAGNOSIS — Z79899 Other long term (current) drug therapy: Secondary | ICD-10-CM | POA: Diagnosis not present

## 2019-10-14 DIAGNOSIS — C3432 Malignant neoplasm of lower lobe, left bronchus or lung: Secondary | ICD-10-CM | POA: Insufficient documentation

## 2019-10-14 DIAGNOSIS — Z923 Personal history of irradiation: Secondary | ICD-10-CM | POA: Insufficient documentation

## 2019-10-14 DIAGNOSIS — Z853 Personal history of malignant neoplasm of breast: Secondary | ICD-10-CM | POA: Diagnosis not present

## 2019-10-14 DIAGNOSIS — M8580 Other specified disorders of bone density and structure, unspecified site: Secondary | ICD-10-CM | POA: Insufficient documentation

## 2019-10-14 DIAGNOSIS — D62 Acute posthemorrhagic anemia: Secondary | ICD-10-CM

## 2019-10-14 DIAGNOSIS — I2510 Atherosclerotic heart disease of native coronary artery without angina pectoris: Secondary | ICD-10-CM | POA: Diagnosis not present

## 2019-10-14 LAB — CMP (CANCER CENTER ONLY)
ALT: 21 U/L (ref 0–44)
AST: 24 U/L (ref 15–41)
Albumin: 3.9 g/dL (ref 3.5–5.0)
Alkaline Phosphatase: 99 U/L (ref 38–126)
Anion gap: 10 (ref 5–15)
BUN: 17 mg/dL (ref 8–23)
CO2: 29 mmol/L (ref 22–32)
Calcium: 9.2 mg/dL (ref 8.9–10.3)
Chloride: 104 mmol/L (ref 98–111)
Creatinine: 0.87 mg/dL (ref 0.44–1.00)
GFR, Est AFR Am: >60 mL/min (ref >60)
GFR, Estimated: >60 mL/min (ref >60)
Glucose, Bld: 104 mg/dL — ABNORMAL HIGH (ref 70–99)
Potassium: 4.7 mmol/L (ref 3.5–5.1)
Sodium: 143 mmol/L (ref 135–145)
Total Bilirubin: 0.5 mg/dL (ref 0.3–1.2)
Total Protein: 7.2 g/dL (ref 6.5–8.1)

## 2019-10-14 LAB — CBC WITH DIFFERENTIAL (CANCER CENTER ONLY)
Abs Immature Granulocytes: 0 10*3/uL (ref 0.00–0.07)
Basophils Absolute: 0 10*3/uL (ref 0.0–0.1)
Basophils Relative: 1 %
Eosinophils Absolute: 0.2 10*3/uL (ref 0.0–0.5)
Eosinophils Relative: 3 %
HCT: 39 % (ref 36.0–46.0)
Hemoglobin: 12.8 g/dL (ref 12.0–15.0)
Immature Granulocytes: 0 %
Lymphocytes Relative: 28 %
Lymphs Abs: 1.2 10*3/uL (ref 0.7–4.0)
MCH: 29.6 pg (ref 26.0–34.0)
MCHC: 32.8 g/dL (ref 30.0–36.0)
MCV: 90.1 fL (ref 80.0–100.0)
Monocytes Absolute: 0.4 10*3/uL (ref 0.1–1.0)
Monocytes Relative: 8 %
Neutro Abs: 2.6 10*3/uL (ref 1.7–7.7)
Neutrophils Relative %: 60 %
Platelet Count: 163 10*3/uL (ref 150–400)
RBC: 4.33 MIL/uL (ref 3.87–5.11)
RDW: 13.6 % (ref 11.5–15.5)
WBC Count: 4.4 10*3/uL (ref 4.0–10.5)
nRBC: 0 % (ref 0.0–0.2)

## 2019-10-14 NOTE — Progress Notes (Signed)
[place]   [phone] [phone]   Clinic Follow up Note   Patient Care Team: [name], MD as PCP - General (Internal Medicine) [name], MD as Consulting Physician (Cardiothoracic Surgery) [name], MD as Consulting Physician (Urology) [name], MD as Consulting Physician (Hematology) [name], MD as Consulting Physician (Radiation Oncology) Hayden Pedro, PA-C as Physician Assistant (Radiation Oncology)   I connected with [name] on 10/15/2019 at  1:20 PM EDT by telephone visit and verified that I am speaking with the correct person using two identifiers.  I discussed the limitations, risks, security and privacy concerns of performing an evaluation and management service by telephone and the availability of in person appointments. I also discussed with the patient that there may be a patient responsible charge related to this service. The patient expressed understanding and agreed to proceed.   Other persons participating in the visit and their role in the encounter:  None  Patient's location:  Her Home  Provider's location:  My Office   CHIEF COMPLAINT: Follow up right breast cancer  SUMMARY OF ONCOLOGIC HISTORY: Oncology History Overview Note  Breast cancer (Right breast)   Staging form: Breast, AJCC 7th Edition     Pathologic stage from 10/17/2014: Stage IA (T1b, N0, cM0) - Signed by [name], MD on 10/17/2014 Lung cancer (LLL)   Staging form: Lung, AJCC 7th Edition     Clinical stage from 12/14/2014: Stage IA (T1a, N0, M0) - Unsigned     Breast cancer of upper-outer quadrant of right female breast ([place])  05/04/2014 Initial Diagnosis   Breast cancer   05/19/2014 Imaging   Diagnostic mammogram and ultrasound showed a 7 mm irregular suspicious mass located in the right breast at the 10:00 position, 7 cm from the nipple. Otherwise negative.   05/19/2014 Initial Biopsy   Right  breast biopsy (UOQ): IDC, grade 2.    05/19/2014 Receptors her2   ER 100% positive, PR 35% positive, HER-2 negative, Ki67 8%   06/07/2014 Miscellaneous   Genetic testing normal.  APC, ATM, BARD1, BMPR1A, BRCA1, BRCA2, BRIP1, CDH1, CDK4, CDKN2A, CHEK2, EPCAM, GREM1, MLH1, MRE11A, MSH2, MSH6, MUTYH, NBN, NF1, PALB2, PMS2, POLD1, POLE, PTEN, RAD50, RAD51D, SMAD4, SMARCA4, STK11, and TP53 tested.    07/23/2014 Surgery   Right breast lumpectomy with SLNB Lucia Gaskins) showed invasive ductal carcinoma & DCIS; negative surgical margins.   07/23/2014 Pathology Results   Invasive ductal carcinoma, tumor size 0.8 cm, grade 1, no lymphovascular invasion, margins were negative, 2 sentinel lymph nodes were negative. (+) DCIS.   07/23/2014 Pathologic Stage   pT1b, pN0: Stage IA    09/16/2014 - 10/07/2014 Radiation Therapy   XRT completed Pablo Ledger). Right breast/ 42.72 Gy at 2.67 Gy per fraction x 21 fractions.     Anti-estrogen oral therapy   Aromasin prescribed in 01/2015 but Patient declines anti-estrogen therapy at this time d/t high co-pay of medication and possible side effects of AI therapy.    05/02/2015 Mammogram   Diagnostic TOMO bilat mammogram: No evidence of malignancy in either breast. Lumpectomy changes in right breast. Diagnostic mammo suggested in 1 year.    Primary cancer of left lower lobe of lung ([place])  10/25/2013 Imaging   CXR done for stroke protocol. No acute chest findings. Chronic lung disease with biapical scarring. Developing nodule in (L) apex cannot be excluded based on exam, athough may be d/t overlap of osseous structures.    10/27/2013 Imaging   CXR: Nodular  opacity in mid (L) apex and appears more prominent than prior study. Area that appeared masslike in LUL near apex on recent CXR not seen at this time.  Underlying emphysema present. No edema or consolidation   11/12/2013 Imaging   CT chest: At left lung apex, there is asymmetric pleural parenchymal scarring. No discrete  lesion.  In superior segment of LLL, there are 1 dominant ground-glass opacity measuring 16 mm. 2 addt'l ground-glass opacities appreciated. F/U with CT in 3 months   02/11/2014 Imaging   CT chest: Several small bilat faint nodular densities, largest measures 1.3 cm over superior segment LLL. No new nodules, no adenopathy. Cannot exclude metastatic disease given pt's h/o melanoma. Recommend PET-CT   02/23/2014 PET scan   Persistent semi-solid nodule in superior segment LLL with low metabolic activity. Moderately metabolic subcarinal & hilar LN are likely reactive.    05/25/2014 Imaging   CT chest: Persistent & similar size dominant LLL sub-solid nodule. Adenocarcinoma is considered. Biopsy or resection strongly considered. Additional scattered small ground-glass nodules stable.    06/02/2014 Imaging   MRI brain: Grossly stable appearance of left planum sphenoidale meningioma measuring 20 x 14 mm. Meningioma does not extend to left orbital apex & contacts left optic nerve. Stable right posterior clinoid meningioma .    09/27/2014 Imaging   CT chest: Further enlargment of central solid component in sub-solid LLL pulm nodule. Findings remain concerning for adenoca. Additional scattered small bilat ground-glass nodules and biapical scarring stable. No adenopathy.     11/25/2014 Surgery   Video bronchoscopy with endobronchial ultrasound; Video assisted thoracoscopy; Thoracoscopic LLL superior segmentectomy; Mediastinal lymph node dissection; Cyro intercostal nerve block Roxan Hockey)   11/25/2014 Pathology Results   Superior segment lung resection (LLL). Adenocarcinoma, well-diff, spanning 1.5 cm. Surgical margins negative. 5 LNs negative.    11/25/2014 Pathologic Stage   pT1a, pN0: Stage IA    11/25/2014 Initial Diagnosis   Primary cancer of left lower lobe of lung ([place])   05/31/2015 Imaging   CT chest: No evidence of local recurrent at superior segmentectomy in LLL. New small nodular focus of  consolidation surrounding thin parenchymal band. Additional sub-cm ground-glass and solid nodules favored to be benign. No thoracic adenopathy   08/11/2015 Imaging   CT chest: No evidence of residual or recurrent tumor in LLL. Stable small nodular focus in LLL with thin surrounding parenchymal band, favored to be benign.  Multiple small ground-glass nodules unchanged.    06/19/2017 Pathology Results   Diagnosis 1. Lung, biopsy, Left lower lobe - ADENOCARCINOMA. - SEE COMMENT. 2. Lung, biopsy, Left lower lobe - BENIGN LUNG PARENCHYMA. - THERE IS NO EVIDENCE OF MALIGNANCY.   07/09/2017 - 07/22/2017 Radiation Therapy   Radiation treatment dates: 07/09/17-07/22/17 by Dr. Lisbeth Renshaw    Site/dose:  Left lung/ 50 Gy in 10 fractions   Beams/energy:  IMRT/ 6X   Narrative: The patient tolerated radiation treatment relatively well. She denied pain, fatigue, or difficulty swallowing. Skin to the treatment area appears warm, dry, and normal in color.   06/18/2019 Imaging   CT Chest  IMPRESSION: 1. There are 2 round solid nodules within the left lower lobe which have increased in size when compared with the previous exam, suspicious. Additionally, along the area of postsurgical change around the oblique fissure of the left lung there is progressive thickening with persistent loculated fluid. Cannot rule out local tumor recurrence along the suture line. 2. No significant change in the appearance of bilateral upper lobe sub solid lung nodules.  3. Aortic Atherosclerosis (ICD10-I70.0). Coronary artery calcifications.     07/07/2019 PET scan   IMPRESSION: 1. No evidence of hypermetabolic local tumor recurrence in the left lung. Low level nonfocal uptake along the segmentectomy site in the left lower lobe favors post treatment change. Continued chest CT surveillance warranted. 2. Two small solid left lower lobe pulmonary nodules, largest 0.7 cm, below PET resolution. Given the growth of these nodules  since 10/03/2018 chest CT, metastatic disease remains on the differential. Close chest CT surveillance recommended. 3. Nonspecific hypermetabolism within nonenlarged subcarinal and bilateral hilar lymph nodes, not substantially changed since 03/04/2017 PET-CT study, favoring reactive uptake. 4. No hypermetabolic distant metastatic disease. 5. Chronic findings include: Aortic Atherosclerosis (ICD10-I70.0). Subcentimeter ground-glass pulmonary nodules in the upper lobes are unchanged and are below PET resolution. Moderate sigmoid diverticulosis. Chronic right maxillary sinusitis.      CURRENT THERAPY:  Surveillance  INTERVAL HISTORY:  HARVEST DEIST is here for a follow up of right breast cancer. She notes she is doing fairly well. She notes coughing spell when she first lays down then resolves. This is not new to her. She does not have long term follow up with her prior physicians.    REVIEW OF SYSTEMS:   Constitutional: Denies fevers, chills or abnormal weight loss Eyes: Denies blurriness of vision Ears, nose, mouth, throat, and face: Denies mucositis or sore throat Respiratory: Denies dyspnea or wheezes (+) Cough when laying down.  Cardiovascular: Denies palpitation, chest discomfort or lower extremity swelling Gastrointestinal:  Denies nausea, heartburn or change in bowel habits Skin: Denies abnormal skin rashes Lymphatics: Denies new lymphadenopathy or easy bruising Neurological:Denies numbness, tingling or new weaknesses Behavioral/Psych: Mood is stable, no new changes  All other systems were reviewed with the patient and are negative.  MEDICAL HISTORY:  Past Medical History:  Diagnosis Date  . Adenocarcinoma ([place])    recurrent/notes 07/03/2017  . Blind left eye 10/2013   cva  . Breast cancer ([place]) 05/2014   ER+/PR+/Her2-     LUNG CANCER  . Bundle branch block left   . Chest pain   . Family history of bladder cancer   . Family history of breast cancer   . Family  history of prostate cancer   . GERD (gastroesophageal reflux disease)   . History of blood transfusion    age 89  . Hyperlipidemia   . Hypertension   . Hypothyroid   . Lung nodule   . Melanoma in situ of face ([place]) JUNE 2015   LEFT CHEEK  . Meningioma ([place])    brain lining  . Migraine    reports only having one  . Osteoarthritis   . Personal history of radiation therapy   . Radiation 09/16/14-10/07/14   Right  Breast  21 fractions  . Skin cancer   . Stroke [facility]) 2015   Blind in left eye as a result    SURGICAL HISTORY: Past Surgical History:  Procedure Laterality Date  . ABDOMINAL HYSTERECTOMY  ~ 1997  . APPENDECTOMY     age 23  . BILATERAL SALPINGOOPHORECTOMY    . BLADDER REPAIR  2009   cystocele  . BRAIN MENINGIOMA EXCISION     Gamma knife to Meningioma in brain lining  . BREAST BIOPSY    . BREAST LUMPECTOMY Right 07/23/2014   invasive ductal  . BUNIONECTOMY     bilateral great toe  . CARDIAC CATHETERIZATION  2004  . CHOLECYSTECTOMY  ~ 1997  . COLONOSCOPY    .  CRYO INTERCOSTAL NERVE BLOCK Left 11/25/2014   Procedure: CRYO INTERCOSTAL NERVE BLOCK;  Surgeon: [name], MD;  Location: [place];  Service: Thoracic;  Laterality: Left;  . CT RADIATION THERAPY GUIDE     Gamma radiation -lt frontal-[facility]  . DILATION AND CURETTAGE OF UTERUS     X 2  . HIP ARTHROPLASTY Left 07/04/2017   Procedure: ARTHROPLASTY BIPOLAR HIP (HEMIARTHROPLASTY);  Surgeon: Tania Ade, MD;  Location: [place];  Service: Orthopedics;  Laterality: Left;  Marland Kitchen MELANOMA EXCISION Left 11/05/23, 11/30/13   CHEEK  . SEGMENTECOMY Left 11/25/2014   Procedure: LEFT LOWER LOBE SEGMENTECTOMY;  Surgeon: [name], MD;  Location: [place];  Service: Thoracic;  Laterality: Left;  Marland Kitchen VIDEO ASSISTED THORACOSCOPY Left 11/25/2014   Procedure: VIDEO ASSISTED THORACOSCOPY;  Surgeon: [name], MD;  Location: [place];  Service: Thoracic;  Laterality: Left;  Marland Kitchen VIDEO BRONCHOSCOPY WITH ENDOBRONCHIAL  NAVIGATION N/A 06/19/2017   Procedure: VIDEO BRONCHOSCOPY;  Surgeon: [name], MD;  Location: [place];  Service: Thoracic;  Laterality: N/A;  . VIDEO BRONCHOSCOPY WITH ENDOBRONCHIAL ULTRASOUND N/A 11/25/2014   Procedure: VIDEO BRONCHOSCOPY WITH ENDOBRONCHIAL ULTRASOUND;  Surgeon: [name], MD;  Location: [place];  Service: Thoracic;  Laterality: N/A;    I have reviewed the social history and family history with the patient and they are unchanged from previous note.  ALLERGIES:  is allergic to beef-derived products; other; chocolate; ibuprofen; niacin and related; and tramadol.  MEDICATIONS:  Current Outpatient Medications  Medication Sig Dispense Refill  . atenolol (TENORMIN) 50 MG tablet Take 50 mg by mouth at bedtime.     Marland Kitchen b complex vitamins capsule Take 1 capsule by mouth daily.    . calcium-vitamin D (OSCAL WITH D) 500-200 MG-UNIT per tablet Take 1 tablet by mouth at bedtime.     . Cholecalciferol (VITAMIN D3) 1000 units CAPS Take by mouth daily.    . Digestive Enzymes (ENZYME DIGEST PO) Take 5 mLs by mouth 3 (three) times daily with meals.    . fish oil-omega-3 fatty acids 1000 MG capsule Take 1 g by mouth daily.     Marland Kitchen levothyroxine (SYNTHROID, LEVOTHROID) 112 MCG tablet Take 112 mcg by mouth daily before breakfast.     . lisinopril (PRINIVIL,ZESTRIL) 10 MG tablet Take 1 tablet (10 mg total) by mouth daily. (Patient taking differently: Take 10 mg by mouth 2 (two) times daily. ) 30 tablet 1  . meclizine (ANTIVERT) 12.5 MG tablet Take 12.5 mg by mouth 3 (three) times daily as needed for dizziness.    . Multiple Vitamin (MULTIVITAMIN) tablet Take 1 tablet by mouth every morning.     . Omega-3 1000 MG CAPS Take 1 g by mouth daily.    . Polyvinyl Alcohol-Povidone (REFRESH OP) Apply 1 drop to eye as needed (DRY EYES).     . Triamcinolone Acetonide (NASACORT AQ NA) Place 1 spray into the nose at bedtime as needed (CONGESTION).    Marland Kitchen vitamin E (E-400) 400 UNIT capsule Take  400 Units by mouth every Monday, Wednesday, and Friday. Mon Wed Fri     No current facility-administered medications for this visit.    PHYSICAL EXAMINATION: ECOG PERFORMANCE STATUS: 1 - Symptomatic but completely ambulatory  No vitals taken today, Exam not performed today   LABORATORY DATA:  I have reviewed the data as listed CBC Latest Ref Rng & Units 10/14/2019 07/09/2017 07/06/2017  WBC 4.0 - 10.5 K/uL 4.4 6.1 7.3  Hemoglobin 12.0 - 15.0  g/dL 12.8 10.9(A) 10.4(L)  Hematocrit 36.0 - 46.0 % 39.0 33(A) 31.4(L)  Platelets 150 - 400 K/uL 163 218 137(L)     CMP Latest Ref Rng & Units 10/14/2019 10/03/2018 03/14/2018  Glucose 70 - 99 mg/dL 104(H) - -  BUN 8 - 23 mg/dL '17 18 14  ' Creatinine 0.44 - 1.00 mg/dL 0.87 0.74 0.69  Sodium 135 - 145 mmol/L 143 - -  Potassium 3.5 - 5.1 mmol/L 4.7 - -  [place] 98 - 111 mmol/L 104 - -  CO2 22 - 32 mmol/L 29 - -  Calcium 8.9 - 10.3 mg/dL 9.2 - -  Total Protein 6.5 - 8.1 g/dL 7.2 - -  Total Bilirubin 0.3 - 1.2 mg/dL 0.5 - -  Alkaline Phos 38 - 126 U/L 99 - -  AST 15 - 41 U/L 24 - -  ALT 0 - 44 U/L 21 - -      RADIOGRAPHIC STUDIES: I have personally reviewed the radiological images as listed and agreed with the findings in the report. CT Chest Wo Contrast  Result Date: 10/15/2019 CLINICAL DATA:  Restaging non-small cell lung cancer. 10/14/2019 EXAM: CT CHEST WITHOUT CONTRAST TECHNIQUE: Multidetector CT imaging of the chest was performed following the standard protocol without IV contrast. COMPARISON:  PET-CT 07/07/2019 and CT chest 06/18/2019. FINDINGS: Cardiovascular: The heart size is within normal limits. No pericardial effusion identified. Aortic atherosclerosis. Lad, left circumflex and RCA coronary artery calcifications. Mediastinum/Nodes: No enlarged mediastinal or axillary lymph nodes. Thyroid gland, trachea, and esophagus demonstrate no significant findings. Lungs/Pleura: No pleural effusion identified. No acute airspace consolidation,  atelectasis, or pneumothorax. Postoperative changes from left lower lobe segmentectomy noted. Unchanged appearance of soft tissue thickening along the chain of sutures within the left midlung, image 60/7. Scattered solid and sub solid lung nodules are again identified. Index nodule in the left lower lobe measures 7 mm, image 79/7. Stable. Index nodule within the posteromedial left lower lobe (adjacent to linear scar) measures 6 mm, image 102/7. Stable Left upper lobe ground-glass nodule measures 6 mm, image 41/7. Previously this was measured at 7 mm. Right upper lobe part solid nodule measures 7 mm with a 3 mm solid component, image 44/7. Also unchanged Posterior right upper lobe ground-glass attenuating nodule measures 5 mm, image 46/7. Unchanged. Anterior right apex solid nodule measures 4 mm and is unchanged, image 24/7. Chronic area of masslike architectural distortion within the right middle lobe is again noted and is likely postinflammatory/infectious, image 114/7. Similar appearance of subpleural reticulation and pleuroparenchymal scarring within the anterior right upper lobe which is favored to be sequelae of external beam radiation. Upper Abdomen: No acute abnormality. Musculoskeletal: There is diffuse osteopenia. Multi level degenerative disc disease identified throughout the thoracic and visualized lumbar spine. No acute or suspicious osseous findings. IMPRESSION: 1. Stable appearance of the chest status post left lower lobe segmentectomy. Stable appearance of soft tissue thickening along the chain of sutures within the left midlung. 2. Stable appearance of bilateral solid and sub-solid/part solid lung nodules. Persistent sub solid and part solid nodules may reflect multifocal adenocarcinoma. 3. Aortic atherosclerosis, in addition to 3 vessel coronary artery disease. Please note that although the presence of coronary artery calcium documents the presence of coronary artery disease, the severity of this  disease and any potential stenosis cannot be assessed on this non-gated CT examination. Assessment for potential risk factor modification, dietary therapy or pharmacologic therapy may be warranted, if clinically indicated. Aortic Atherosclerosis (ICD10-I70.0). Electronically Signed   By:  Kerby Moors M.D.   On: 10/15/2019 11:17     ASSESSMENT & PLAN:  Melissa Snyder is a 86 y.o. female with    1. Stage I left lung adenocarcinoma, pT1aN0M0, recurrent LLL lung adenocarcinoma in 06/2017, new lung nodules  -initially diagnosed in 11/2014. Herleft lower lobe segmentectomy showed stage I adenocarcinoma -Given the early stage of lung cancer, she does not need adjuvant chemotherapy. -She had cancer recurrence inLLL in1/2019.She was treated with radiation in 07/2017 with Dr. Lisbeth Renshaw. -She does still have residual left lateral ribcage soreness, likely nerve damage from initial surgery.  -She was followed by Dr. Roxan Hockey for surveillance, but no more future appointment. -I ordered a surveillance CT chest 06/18/19 which showed 2 left lung nodules have increased in size and mild fluid in left lung. We reviewed in thoracic tumor board, and recommend a PET scan  -Her PET from 07/07/19 showed no evidence of hypermetabolic local tumor recurrence in left lung and no hypermetabolic distant metastasis. Her left lung nodules are too small and probably below PET scan sensitivity, overall no high suspicion for recurrence.  -I personally read and reviewed her CT chest from 10/14/19 which indicates stable/slihgtly improved lung nodules. There is no new nodules. Will continue monitoring  -She notes she is currently doing well. She notes only having a cough when she first lays down. Labs reviewed form yesterday, CBC and CMP WNL.  -F/u in 6 months with NP Lacie with a repeated CT chest scan before visit.    2. Right breast invasive ductal carcinoma, pT1b N0 M0 stage IA, ER positive PR positive and HER-2 negative. -She  was diagnosed in 05/2014. She is s/p right lumpectomy and adjuvant radiation. -I recommended her antiestrogen with Exemestane in 2016 and she declined at that time due to cost and concern with side effects. Will continue with breast cancer surveillance.  -She is clinically stable from a breast standpoint. Her 06/2019 Mammogram was normal. There is no clinical concern for recurrence. -Continue Surveillance. Next mammogram set for 06/2020   3. Dull Headache and unstable balance  -This has been going on for 1 month. She plans to have Brain MRI and see Dr. Salomon Fick next week  -She notes she had HA's before and was found to have benign brain tumor. She has f/u with Dr Salomon Fick since.  4. Low appetite  -She has lowered appetite but has been able to maintain weight so far.  -She has been taking protein supplements, I recommend Ensure or boost which has more calories.   5. She'll continue follow-up with her primary care physician and other specialists for her other medical issues. -She is s/p partial hip replacement due to fall on ice in 2018. Ambulates adequately.   Plan: -CT Chest reviewed, Stable lung nodules, will continue monitoring.  - f/u with NP Lacie in 6 months with lab and CT Chest wo contrast a few days before -I will mail her the CT Chest report and date of future appointments.    No problem-specific Assessment & Plan notes found for this encounter.   Orders Placed This Encounter  Procedures  . CT Chest Wo Contrast    Standing Status:   Future    Standing Expiration Date:   10/14/2020    Order Specific Question:   Preferred imaging location?    Answer:   [facility]    Order Specific Question:   Radiology Contrast Protocol - do NOT remove file path    Answer:   [email or web address]  I discussed the assessment and treatment plan with the patient. The patient was provided an opportunity to ask questions and all were answered. The patient  agreed with the plan and demonstrated an understanding of the instructions.  The patient was advised to call back or seek an in-person evaluation if the symptoms worsen or if the condition fails to improve as anticipated.  The total time spent in the appointment was 25 minutes.    [name], MD 10/15/2019   I, Joslyn Devon, am acting as scribe for [name], MD.   I have reviewed the above documentation for accuracy and completeness, and I agree with the above.

## 2019-10-15 ENCOUNTER — Other Ambulatory Visit: Payer: Self-pay

## 2019-10-15 ENCOUNTER — Encounter: Payer: Self-pay | Admitting: Hematology

## 2019-10-15 ENCOUNTER — Inpatient Hospital Stay (HOSPITAL_BASED_OUTPATIENT_CLINIC_OR_DEPARTMENT_OTHER): Payer: Medicare HMO | Admitting: Hematology

## 2019-10-15 DIAGNOSIS — C3432 Malignant neoplasm of lower lobe, left bronchus or lung: Secondary | ICD-10-CM

## 2019-10-22 DIAGNOSIS — H524 Presbyopia: Secondary | ICD-10-CM | POA: Diagnosis not present

## 2019-10-22 DIAGNOSIS — H349 Unspecified retinal vascular occlusion: Secondary | ICD-10-CM | POA: Diagnosis not present

## 2019-10-22 DIAGNOSIS — D3131 Benign neoplasm of right choroid: Secondary | ICD-10-CM | POA: Diagnosis not present

## 2019-11-05 DIAGNOSIS — R69 Illness, unspecified: Secondary | ICD-10-CM | POA: Diagnosis not present

## 2019-11-10 DIAGNOSIS — C3432 Malignant neoplasm of lower lobe, left bronchus or lung: Secondary | ICD-10-CM | POA: Diagnosis not present

## 2019-11-10 DIAGNOSIS — H5440 Blindness, one eye, unspecified eye: Secondary | ICD-10-CM | POA: Diagnosis not present

## 2019-11-10 DIAGNOSIS — C50911 Malignant neoplasm of unspecified site of right female breast: Secondary | ICD-10-CM | POA: Diagnosis not present

## 2019-12-22 DIAGNOSIS — C3490 Malignant neoplasm of unspecified part of unspecified bronchus or lung: Secondary | ICD-10-CM | POA: Diagnosis not present

## 2019-12-22 DIAGNOSIS — Z853 Personal history of malignant neoplasm of breast: Secondary | ICD-10-CM | POA: Diagnosis not present

## 2019-12-22 DIAGNOSIS — M810 Age-related osteoporosis without current pathological fracture: Secondary | ICD-10-CM | POA: Diagnosis not present

## 2019-12-22 DIAGNOSIS — I10 Essential (primary) hypertension: Secondary | ICD-10-CM | POA: Diagnosis not present

## 2019-12-22 DIAGNOSIS — K219 Gastro-esophageal reflux disease without esophagitis: Secondary | ICD-10-CM | POA: Diagnosis not present

## 2019-12-22 DIAGNOSIS — E039 Hypothyroidism, unspecified: Secondary | ICD-10-CM | POA: Diagnosis not present

## 2019-12-22 DIAGNOSIS — D329 Benign neoplasm of meninges, unspecified: Secondary | ICD-10-CM | POA: Diagnosis not present

## 2019-12-22 DIAGNOSIS — M1990 Unspecified osteoarthritis, unspecified site: Secondary | ICD-10-CM | POA: Diagnosis not present

## 2020-01-04 DIAGNOSIS — I69398 Other sequelae of cerebral infarction: Secondary | ICD-10-CM | POA: Diagnosis not present

## 2020-01-04 DIAGNOSIS — K219 Gastro-esophageal reflux disease without esophagitis: Secondary | ICD-10-CM | POA: Diagnosis not present

## 2020-01-04 DIAGNOSIS — H5462 Unqualified visual loss, left eye, normal vision right eye: Secondary | ICD-10-CM | POA: Diagnosis not present

## 2020-01-04 DIAGNOSIS — E039 Hypothyroidism, unspecified: Secondary | ICD-10-CM | POA: Diagnosis not present

## 2020-01-04 DIAGNOSIS — G8929 Other chronic pain: Secondary | ICD-10-CM | POA: Diagnosis not present

## 2020-01-04 DIAGNOSIS — I10 Essential (primary) hypertension: Secondary | ICD-10-CM | POA: Diagnosis not present

## 2020-01-04 DIAGNOSIS — I2510 Atherosclerotic heart disease of native coronary artery without angina pectoris: Secondary | ICD-10-CM | POA: Diagnosis not present

## 2020-01-04 DIAGNOSIS — E785 Hyperlipidemia, unspecified: Secondary | ICD-10-CM | POA: Diagnosis not present

## 2020-01-04 DIAGNOSIS — I700 Atherosclerosis of aorta: Secondary | ICD-10-CM | POA: Diagnosis not present

## 2020-01-04 DIAGNOSIS — J302 Other seasonal allergic rhinitis: Secondary | ICD-10-CM | POA: Diagnosis not present

## 2020-02-02 DIAGNOSIS — I8393 Asymptomatic varicose veins of bilateral lower extremities: Secondary | ICD-10-CM | POA: Diagnosis not present

## 2020-02-02 DIAGNOSIS — L708 Other acne: Secondary | ICD-10-CM | POA: Diagnosis not present

## 2020-02-02 DIAGNOSIS — D229 Melanocytic nevi, unspecified: Secondary | ICD-10-CM | POA: Diagnosis not present

## 2020-02-02 DIAGNOSIS — L821 Other seborrheic keratosis: Secondary | ICD-10-CM | POA: Diagnosis not present

## 2020-02-02 DIAGNOSIS — L814 Other melanin hyperpigmentation: Secondary | ICD-10-CM | POA: Diagnosis not present

## 2020-02-02 DIAGNOSIS — L905 Scar conditions and fibrosis of skin: Secondary | ICD-10-CM | POA: Diagnosis not present

## 2020-02-02 DIAGNOSIS — Z85828 Personal history of other malignant neoplasm of skin: Secondary | ICD-10-CM | POA: Diagnosis not present

## 2020-02-02 DIAGNOSIS — D1801 Hemangioma of skin and subcutaneous tissue: Secondary | ICD-10-CM | POA: Diagnosis not present

## 2020-02-11 ENCOUNTER — Other Ambulatory Visit: Payer: Self-pay

## 2020-02-11 ENCOUNTER — Ambulatory Visit (INDEPENDENT_AMBULATORY_CARE_PROVIDER_SITE_OTHER): Payer: Medicare HMO

## 2020-02-11 ENCOUNTER — Encounter: Payer: Self-pay | Admitting: Podiatry

## 2020-02-11 ENCOUNTER — Ambulatory Visit: Payer: Medicare HMO | Admitting: Podiatry

## 2020-02-11 DIAGNOSIS — M2041 Other hammer toe(s) (acquired), right foot: Secondary | ICD-10-CM | POA: Diagnosis not present

## 2020-02-11 DIAGNOSIS — M778 Other enthesopathies, not elsewhere classified: Secondary | ICD-10-CM | POA: Diagnosis not present

## 2020-02-11 DIAGNOSIS — Q828 Other specified congenital malformations of skin: Secondary | ICD-10-CM | POA: Diagnosis not present

## 2020-02-11 MED ORDER — KETOCONAZOLE 2 % EX CREA: 1.0000 "application " | TOPICAL_CREAM | Freq: Two times a day (BID) | CUTANEOUS | 2 refills | Status: DC

## 2020-02-11 NOTE — Progress Notes (Signed)
Subjective:  Patient ID: Melissa Snyder, female    DOB: 20-Jun-1933,  MRN: [id number] HPI Chief Complaint  Patient presents with  . Toe Pain    2nd toe right - swollen and tender x 6 months, callused area rubbing the big toe, feels the toe rubbing shoes at times too  . Skin Problem    Dorsally between 3rd/4th toes left - rash, was itchy and red, skin feels rough, using vaseline  . New Patient (Initial Visit)    86 y.o. female presents with the above complaint.   ROS: Denies fever chills nausea vomiting muscle aches pains calf pain back pain chest pain shortness of breath.  Past Medical History:  Diagnosis Date  . Adenocarcinoma ([place])    recurrent/notes 07/03/2017  . Blind left eye 10/2013   cva  . Breast cancer ([place]) 05/2014   ER+/PR+/Her2-     LUNG CANCER  . Bundle branch block left   . Chest pain   . Family history of bladder cancer   . Family history of breast cancer   . Family history of prostate cancer   . GERD (gastroesophageal reflux disease)   . History of blood transfusion    age 39  . Hyperlipidemia   . Hypertension   . Hypothyroid   . Lung nodule   . Melanoma in situ of face ([facility]) JUNE 2015   LEFT CHEEK  . Meningioma ([place])    brain lining  . Migraine    reports only having one  . Osteoarthritis   . Personal history of radiation therapy   . Radiation 09/16/14-10/07/14   Right  Breast  21 fractions  . Skin cancer   . Stroke [facility]) 2015   Blind in left eye as a result   Past Surgical History:  Procedure Laterality Date  . ABDOMINAL HYSTERECTOMY  ~ 1997  . APPENDECTOMY     age 89  . BILATERAL SALPINGOOPHORECTOMY    . BLADDER REPAIR  2009   cystocele  . BRAIN MENINGIOMA EXCISION     Gamma knife to Meningioma in brain lining  . BREAST BIOPSY    . BREAST LUMPECTOMY Right 07/23/2014   invasive ductal  . BUNIONECTOMY     bilateral great toe  . CARDIAC CATHETERIZATION  2004  . CHOLECYSTECTOMY  ~ 1997  . COLONOSCOPY    . CRYO INTERCOSTAL NERVE BLOCK Left  11/25/2014   Procedure: CRYO INTERCOSTAL NERVE BLOCK;  Surgeon: [name], MD;  Location: [facility];  Service: Thoracic;  Laterality: Left;  . CT RADIATION THERAPY GUIDE     Gamma radiation -lt frontal-[facility]  . DILATION AND CURETTAGE OF UTERUS     X 2  . HIP ARTHROPLASTY Left 07/04/2017   Procedure: ARTHROPLASTY BIPOLAR HIP (HEMIARTHROPLASTY);  Surgeon: Tania Ade, MD;  Location: [facility];  Service: Orthopedics;  Laterality: Left;  Marland Kitchen MELANOMA EXCISION Left 11/05/23, 11/30/13   CHEEK  . SEGMENTECOMY Left 11/25/2014   Procedure: LEFT LOWER LOBE SEGMENTECTOMY;  Surgeon: [name], MD;  Location: [facility];  Service: Thoracic;  Laterality: Left;  Marland Kitchen VIDEO ASSISTED THORACOSCOPY Left 11/25/2014   Procedure: VIDEO ASSISTED THORACOSCOPY;  Surgeon: [name], MD;  Location: [facility];  Service: Thoracic;  Laterality: Left;  Marland Kitchen VIDEO BRONCHOSCOPY WITH ENDOBRONCHIAL NAVIGATION N/A 06/19/2017   Procedure: VIDEO BRONCHOSCOPY;  Surgeon: [name], MD;  Location: [facility];  Service: Thoracic;  Laterality: N/A;  . VIDEO BRONCHOSCOPY WITH ENDOBRONCHIAL ULTRASOUND N/A 11/25/2014   Procedure: VIDEO BRONCHOSCOPY WITH ENDOBRONCHIAL  ULTRASOUND;  Surgeon: [name], MD;  Location: [facility] OR;  Service: Thoracic;  Laterality: N/A;    Current Outpatient Medications:  .  atenolol (TENORMIN) 50 MG tablet, Take 50 mg by mouth at bedtime. , Disp: , Rfl:  .  b complex vitamins capsule, Take 1 capsule by mouth daily., Disp: , Rfl:  .  calcium-vitamin D (OSCAL WITH D) 500-200 MG-UNIT per tablet, Take 1 tablet by mouth at bedtime. , Disp: , Rfl:  .  Cholecalciferol (VITAMIN D3) 1000 units CAPS, Take by mouth daily., Disp: , Rfl:  .  Digestive Enzymes (ENZYME DIGEST PO), Take 5 mLs by mouth 3 (three) times daily with meals., Disp: , Rfl:  .  fish oil-omega-3 fatty acids 1000 MG capsule, Take 1 g by mouth daily. , Disp: , Rfl:  .  ketoconazole (NIZORAL) 2 % cream, Apply 1 application topically  2 (two) times daily., Disp: 15 g, Rfl: 2 .  levothyroxine (SYNTHROID, LEVOTHROID) 112 MCG tablet, Take 112 mcg by mouth daily before breakfast. , Disp: , Rfl:  .  lisinopril (PRINIVIL,ZESTRIL) 10 MG tablet, Take 1 tablet (10 mg total) by mouth daily. (Patient taking differently: Take 10 mg by mouth 2 (two) times daily. ), Disp: 30 tablet, Rfl: 1 .  meclizine (ANTIVERT) 12.5 MG tablet, Take 12.5 mg by mouth 3 (three) times daily as needed for dizziness., Disp: , Rfl:  .  Multiple Vitamin (MULTIVITAMIN) tablet, Take 1 tablet by mouth every morning. , Disp: , Rfl:  .  Omega-3 1000 MG CAPS, Take 1 g by mouth daily., Disp: , Rfl:  .  Polyvinyl Alcohol-Povidone (REFRESH OP), Apply 1 drop to eye as needed (DRY EYES). , Disp: , Rfl:  .  Triamcinolone Acetonide (NASACORT AQ NA), Place 1 spray into the nose at bedtime as needed (CONGESTION)., Disp: , Rfl:  .  vitamin E (E-400) 400 UNIT capsule, Take 400 Units by mouth every Monday, Wednesday, and Friday. Mon Wed Fri, Disp: , Rfl:   Allergies  Allergen Reactions  . Beef-Derived Products Other (See Comments)    ACID REFLUX  . Other     FRIED FOODS UNSPECIFIED REACTION   . Chocolate Other (See Comments)    Acid Reflux  . Ibuprofen Rash and Other (See Comments)    Causes mouth sores  . Niacin And Related Other (See Comments)    Flushing, burning, redness > REACTIONS NOT ALLERGIC   . Tramadol Itching   Review of Systems Objective:  There were no vitals filed for this visit.  General: Well developed, nourished, in no acute distress, alert and oriented x3   Dermatological: Skin is warm, dry and supple bilateral. Nails x 10 are well maintained; remaining integument appears unremarkable at this time. There are no open sores, no preulcerative lesions, no rash or signs of infection present.  She has a rash between the third and fourth toes dorsally it is small and does appear to be either yeast or fungus.  She also has porokeratosis distal second toe  right.  Vascular: Dorsalis Pedis artery and Posterior Tibial artery pedal pulses are 2/4 bilateral with immedate capillary fill time. Pedal hair growth present. No varicosities and no lower extremity edema present bilateral.   Neruologic: Grossly intact via light touch bilateral. Vibratory intact via tuning fork bilateral. Protective threshold with Semmes Wienstein monofilament intact to all pedal sites bilateral. Patellar and Achilles deep tendon reflexes 2+ bilateral. No Babinski or clonus noted bilateral.   Musculoskeletal: No gross boney pedal deformities bilateral.  No pain, crepitus, or limitation noted with foot and ankle range of motion bilateral. Muscular strength 5/5 in all groups tested bilateral.  She has pain on range of motion of the second metatarsophalangeal joint of the right foot.  Gait: Unassisted, Nonantalgic.    Radiographs:  None taken  Assessment & Plan:   Assessment: Tinea pedis.  Capsulitis.  Porokeratosis.  Plan: Debrided reactive hyperkeratotic tissue wrote a prescription for ketoconazole cream to be applied twice daily.  And injected 2 mg of dexamethasone local anesthetic at the PIPJ second digit right foot.     Eddis Pingleton T. [place], [place]

## 2020-02-29 DIAGNOSIS — Z1211 Encounter for screening for malignant neoplasm of colon: Secondary | ICD-10-CM | POA: Diagnosis not present

## 2020-02-29 DIAGNOSIS — Z1212 Encounter for screening for malignant neoplasm of rectum: Secondary | ICD-10-CM | POA: Diagnosis not present

## 2020-03-01 DIAGNOSIS — E785 Hyperlipidemia, unspecified: Secondary | ICD-10-CM | POA: Diagnosis not present

## 2020-03-01 DIAGNOSIS — E039 Hypothyroidism, unspecified: Secondary | ICD-10-CM | POA: Diagnosis not present

## 2020-03-01 DIAGNOSIS — M810 Age-related osteoporosis without current pathological fracture: Secondary | ICD-10-CM | POA: Diagnosis not present

## 2020-03-01 DIAGNOSIS — I10 Essential (primary) hypertension: Secondary | ICD-10-CM | POA: Diagnosis not present

## 2020-03-08 DIAGNOSIS — M1990 Unspecified osteoarthritis, unspecified site: Secondary | ICD-10-CM | POA: Diagnosis not present

## 2020-03-08 DIAGNOSIS — Z0000 Encounter for general adult medical examination without abnormal findings: Secondary | ICD-10-CM | POA: Diagnosis not present

## 2020-03-08 DIAGNOSIS — C3490 Malignant neoplasm of unspecified part of unspecified bronchus or lung: Secondary | ICD-10-CM | POA: Diagnosis not present

## 2020-03-08 DIAGNOSIS — D329 Benign neoplasm of meninges, unspecified: Secondary | ICD-10-CM | POA: Diagnosis not present

## 2020-03-08 DIAGNOSIS — J309 Allergic rhinitis, unspecified: Secondary | ICD-10-CM | POA: Diagnosis not present

## 2020-03-08 DIAGNOSIS — H9190 Unspecified hearing loss, unspecified ear: Secondary | ICD-10-CM | POA: Diagnosis not present

## 2020-03-08 DIAGNOSIS — K219 Gastro-esophageal reflux disease without esophagitis: Secondary | ICD-10-CM | POA: Diagnosis not present

## 2020-03-08 DIAGNOSIS — I10 Essential (primary) hypertension: Secondary | ICD-10-CM | POA: Diagnosis not present

## 2020-03-08 DIAGNOSIS — Z853 Personal history of malignant neoplasm of breast: Secondary | ICD-10-CM | POA: Diagnosis not present

## 2020-03-08 DIAGNOSIS — E039 Hypothyroidism, unspecified: Secondary | ICD-10-CM | POA: Diagnosis not present

## 2020-03-08 DIAGNOSIS — R82998 Other abnormal findings in urine: Secondary | ICD-10-CM | POA: Diagnosis not present

## 2020-03-08 DIAGNOSIS — M810 Age-related osteoporosis without current pathological fracture: Secondary | ICD-10-CM | POA: Diagnosis not present

## 2020-03-31 ENCOUNTER — Ambulatory Visit: Payer: Medicare HMO | Admitting: Podiatry

## 2020-04-13 ENCOUNTER — Other Ambulatory Visit: Payer: Self-pay

## 2020-04-13 ENCOUNTER — Ambulatory Visit (HOSPITAL_COMMUNITY)
Admission: RE | Admit: 2020-04-13 | Discharge: 2020-04-13 | Disposition: A | Discharge status: Home / Self Care | Payer: Medicare HMO | Source: Ambulatory Visit | Attending: Hematology | Admitting: Hematology

## 2020-04-13 DIAGNOSIS — J439 Emphysema, unspecified: Secondary | ICD-10-CM | POA: Diagnosis not present

## 2020-04-13 DIAGNOSIS — I2510 Atherosclerotic heart disease of native coronary artery without angina pectoris: Secondary | ICD-10-CM | POA: Diagnosis not present

## 2020-04-13 DIAGNOSIS — C3432 Malignant neoplasm of lower lobe, left bronchus or lung: Secondary | ICD-10-CM | POA: Diagnosis not present

## 2020-04-13 DIAGNOSIS — Z85118 Personal history of other malignant neoplasm of bronchus and lung: Secondary | ICD-10-CM | POA: Diagnosis not present

## 2020-04-13 DIAGNOSIS — Z853 Personal history of malignant neoplasm of breast: Secondary | ICD-10-CM | POA: Diagnosis not present

## 2020-04-14 ENCOUNTER — Other Ambulatory Visit (HOSPITAL_COMMUNITY): Payer: Self-pay | Admitting: *Deleted

## 2020-04-15 ENCOUNTER — Ambulatory Visit (HOSPITAL_COMMUNITY)
Admission: RE | Admit: 2020-04-15 | Discharge: 2020-04-15 | Disposition: A | Discharge status: Home / Self Care | Payer: Medicare HMO | Source: Ambulatory Visit | Attending: Internal Medicine | Admitting: Internal Medicine

## 2020-04-15 ENCOUNTER — Other Ambulatory Visit: Payer: Self-pay

## 2020-04-15 DIAGNOSIS — C50411 Malignant neoplasm of upper-outer quadrant of right female breast: Secondary | ICD-10-CM

## 2020-04-15 DIAGNOSIS — M810 Age-related osteoporosis without current pathological fracture: Secondary | ICD-10-CM | POA: Insufficient documentation

## 2020-04-15 DIAGNOSIS — C3432 Malignant neoplasm of lower lobe, left bronchus or lung: Secondary | ICD-10-CM

## 2020-04-15 DIAGNOSIS — Z170 Estrogen receptor positive status [ER+]: Secondary | ICD-10-CM

## 2020-04-15 MED ORDER — DENOSUMAB 60 MG/ML ~~LOC~~ SOSY
PREFILLED_SYRINGE | SUBCUTANEOUS | Status: AC
  Filled 2020-04-15: qty 1

## 2020-04-15 MED ORDER — DENOSUMAB 60 MG/ML ~~LOC~~ SOSY
60.0000 mg | PREFILLED_SYRINGE | Freq: Once | SUBCUTANEOUS | Status: AC
  Administered 2020-04-15: 60 mg via SUBCUTANEOUS

## 2020-04-17 NOTE — Progress Notes (Signed)
Melissa Snyder   [phone] [phone]   Clinic Follow up Note   Patient Care Team: [name], MD as PCP - General (Internal Medicine) [name], MD as Consulting Physician (Cardiothoracic Surgery) [name], MD as Consulting Physician (Urology) [name], MD as Consulting Physician (Hematology) [name], MD as Consulting Physician (Radiation Oncology) Hayden Pedro, PA-C as Physician Assistant (Radiation Oncology) 04/18/2020  CHIEF COMPLAINT: F/u right breast cancer   SUMMARY OF ONCOLOGIC HISTORY: Oncology History Overview Note  Breast cancer (Right breast)   Staging form: Breast, AJCC 7th Edition     Pathologic stage from 10/17/2014: Stage IA (T1b, N0, cM0) - Signed by [name], MD on 10/17/2014 Lung cancer (LLL)   Staging form: Lung, AJCC 7th Edition     Clinical stage from 12/14/2014: Stage IA (T1a, N0, M0) - Unsigned     Breast cancer of upper-outer quadrant of right female breast ([place])  05/04/2014 Initial Diagnosis   Breast cancer   05/19/2014 Imaging   Diagnostic mammogram and ultrasound showed a 7 mm irregular suspicious mass located in the right breast at the 10:00 position, 7 cm from the nipple. Otherwise negative.   05/19/2014 Initial Biopsy   Right breast biopsy (UOQ): IDC, grade 2.    05/19/2014 Receptors her2   ER 100% positive, PR 35% positive, HER-2 negative, Ki67 8%   06/07/2014 Miscellaneous   Genetic testing normal.  APC, ATM, BARD1, BMPR1A, BRCA1, BRCA2, BRIP1, CDH1, CDK4, CDKN2A, CHEK2, EPCAM, GREM1, MLH1, MRE11A, MSH2, MSH6, MUTYH, NBN, NF1, PALB2, PMS2, POLD1, POLE, PTEN, RAD50, RAD51D, SMAD4, SMARCA4, STK11, and TP53 tested.    07/23/2014 Surgery   Right breast lumpectomy with SLNB Lucia Gaskins) showed invasive ductal carcinoma & DCIS; negative surgical margins.   07/23/2014 Pathology Results   Invasive ductal carcinoma, tumor size 0.8 cm, grade 1, no lymphovascular invasion,  margins were negative, 2 sentinel lymph nodes were negative. (+) DCIS.   07/23/2014 Pathologic Stage   pT1b, pN0: Stage IA    09/16/2014 - 10/07/2014 Radiation Therapy   XRT completed Pablo Ledger). Right breast/ 42.72 Gy at 2.67 Gy per fraction x 21 fractions.     Anti-estrogen oral therapy   Aromasin prescribed in 01/2015 but Patient declines anti-estrogen therapy at this time d/t high co-pay of medication and possible side effects of AI therapy.    05/02/2015 Mammogram   Diagnostic TOMO bilat mammogram: No evidence of malignancy in either breast. Lumpectomy changes in right breast. Diagnostic mammo suggested in 1 year.    Primary cancer of left lower lobe of lung ([place])  10/25/2013 Imaging   CXR done for stroke protocol. No acute chest findings. Chronic lung disease with biapical scarring. Developing nodule in (L) apex cannot be excluded based on exam, athough may be d/t overlap of osseous structures.    10/27/2013 Imaging   CXR: Nodular opacity in mid (L) apex and appears more prominent than prior study. Area that appeared masslike in LUL near apex on recent CXR not seen at this time.  Underlying emphysema present. No edema or consolidation   11/12/2013 Imaging   CT chest: At left lung apex, there is asymmetric pleural parenchymal scarring. No discrete lesion.  In superior segment of LLL, there are 1 dominant ground-glass opacity measuring 16 mm. 2 addt'l ground-glass opacities appreciated. F/U with CT in 3 months   02/11/2014 Imaging   CT chest: Several small bilat faint nodular densities, largest measures 1.3 cm over superior segment LLL. No new nodules, no adenopathy. Cannot  exclude metastatic disease given pt's h/o melanoma. Recommend PET-CT   02/23/2014 PET scan   Persistent semi-solid nodule in superior segment LLL with low metabolic activity. Moderately metabolic subcarinal & hilar LN are likely reactive.    05/25/2014 Imaging   CT chest: Persistent & similar size dominant LLL sub-solid  nodule. Adenocarcinoma is considered. Biopsy or resection strongly considered. Additional scattered small ground-glass nodules stable.    06/02/2014 Imaging   MRI brain: Grossly stable appearance of left planum sphenoidale meningioma measuring 20 x 14 mm. Meningioma does not extend to left orbital apex & contacts left optic nerve. Stable right posterior clinoid meningioma .    09/27/2014 Imaging   CT chest: Further enlargment of central solid component in sub-solid LLL pulm nodule. Findings remain concerning for adenoca. Additional scattered small bilat ground-glass nodules and biapical scarring stable. No adenopathy.     11/25/2014 Surgery   Video bronchoscopy with endobronchial ultrasound; Video assisted thoracoscopy; Thoracoscopic LLL superior segmentectomy; Mediastinal lymph node dissection; Cyro intercostal nerve block Roxan Hockey)   11/25/2014 Pathology Results   Superior segment lung resection (LLL). Adenocarcinoma, well-diff, spanning 1.5 cm. Surgical margins negative. 5 LNs negative.    11/25/2014 Pathologic Stage   pT1a, pN0: Stage IA    11/25/2014 Initial Diagnosis   Primary cancer of left lower lobe of lung ([place])   05/31/2015 Imaging   CT chest: No evidence of local recurrent at superior segmentectomy in LLL. New small nodular focus of consolidation surrounding thin parenchymal band. Additional sub-cm ground-glass and solid nodules favored to be benign. No thoracic adenopathy   08/11/2015 Imaging   CT chest: No evidence of residual or recurrent tumor in LLL. Stable small nodular focus in LLL with thin surrounding parenchymal band, favored to be benign.  Multiple small ground-glass nodules unchanged.    06/19/2017 Pathology Results   Diagnosis 1. Lung, biopsy, Left lower lobe - ADENOCARCINOMA. - SEE COMMENT. 2. Lung, biopsy, Left lower lobe - BENIGN LUNG PARENCHYMA. - THERE IS NO EVIDENCE OF MALIGNANCY.   07/09/2017 - 07/22/2017 Radiation Therapy   Radiation treatment dates:  07/09/17-07/22/17 by Dr. Lisbeth Renshaw    Site/dose:  Left lung/ 50 Gy in 10 fractions   Beams/energy:  IMRT/ 6X   Narrative: The patient tolerated radiation treatment relatively well. She denied pain, fatigue, or difficulty swallowing. Skin to the treatment area appears warm, dry, and normal in color.   06/18/2019 Imaging   CT Chest  IMPRESSION: 1. There are 2 round solid nodules within the left lower lobe which have increased in size when compared with the previous exam, suspicious. Additionally, along the area of postsurgical change around the oblique fissure of the left lung there is progressive thickening with persistent loculated fluid. Cannot rule out local tumor recurrence along the suture line. 2. No significant change in the appearance of bilateral upper lobe sub solid lung nodules. 3. Aortic Atherosclerosis (ICD10-I70.0). Coronary artery calcifications.     07/07/2019 PET scan   IMPRESSION: 1. No evidence of hypermetabolic local tumor recurrence in the left lung. Low level nonfocal uptake along the segmentectomy site in the left lower lobe favors post treatment change. Continued chest CT surveillance warranted. 2. Two small solid left lower lobe pulmonary nodules, largest 0.7 cm, below PET resolution. Given the growth of these nodules since 10/03/2018 chest CT, metastatic disease remains on the differential. Close chest CT surveillance recommended. 3. Nonspecific hypermetabolism within nonenlarged subcarinal and bilateral hilar lymph nodes, not substantially changed since 03/04/2017 PET-CT study, favoring reactive uptake. 4. No hypermetabolic  distant metastatic disease. 5. Chronic findings include: Aortic Atherosclerosis (ICD10-I70.0). Subcentimeter ground-glass pulmonary nodules in the upper lobes are unchanged and are below PET resolution. Moderate sigmoid diverticulosis. Chronic right maxillary sinusitis.     CURRENT THERAPY: Surveillance  INTERVAL HISTORY: Ms. Kisling  returns for f/u as scheduled. She was last seen 10/15/19 virtually. She had repeat CT chest 11/10.  She is doing well, no changes in her health since last visit.  Appetite and energy are normal for her and for her age.  Denies unintentional weight loss.  She has stable arthritic pain, no new bone or joint pain.  Denies new concerns in her breast such as mass/lump, nipple discharge or inversion, or skin change.  She has occasional dry cough, nothing persistent, no chest pain or dyspnea.  Denies changes in her bowel habits, bleeding, or other new concerns.  She plans to get her Covid booster in the next 2 weeks.   MEDICAL HISTORY:  Past Medical History:  Diagnosis Date  . Adenocarcinoma ([place])    recurrent/notes 07/03/2017  . Blind left eye 10/2013   cva  . Breast cancer ([place]) 05/2014   ER+/PR+/Her2-     LUNG CANCER  . Bundle branch block left   . Chest pain   . Family history of bladder cancer   . Family history of breast cancer   . Family history of prostate cancer   . GERD (gastroesophageal reflux disease)   . History of blood transfusion    age 82  . Hyperlipidemia   . Hypertension   . Hypothyroid   . Lung nodule   . Melanoma in situ of face ([place]) JUNE 2015   LEFT CHEEK  . Meningioma ([place])    brain lining  . Migraine    reports only having one  . Osteoarthritis   . Personal history of radiation therapy   . Radiation 09/16/14-10/07/14   Right  Breast  21 fractions  . Skin cancer   . Stroke [facility]) 2015   Blind in left eye as a result    SURGICAL HISTORY: Past Surgical History:  Procedure Laterality Date  . ABDOMINAL HYSTERECTOMY  ~ 1997  . APPENDECTOMY     age 82  . BILATERAL SALPINGOOPHORECTOMY    . BLADDER REPAIR  2009   cystocele  . BRAIN MENINGIOMA EXCISION     Gamma knife to Meningioma in brain lining  . BREAST BIOPSY    . BREAST LUMPECTOMY Right 07/23/2014   invasive ductal  . BUNIONECTOMY     bilateral great toe  . CARDIAC CATHETERIZATION  2004  .  CHOLECYSTECTOMY  ~ 1997  . COLONOSCOPY    . CRYO INTERCOSTAL NERVE BLOCK Left 11/25/2014   Procedure: CRYO INTERCOSTAL NERVE BLOCK;  Surgeon: [name], MD;  Location: [place];  Service: Thoracic;  Laterality: Left;  . CT RADIATION THERAPY GUIDE     Gamma radiation -lt frontal-[facility]  . DILATION AND CURETTAGE OF UTERUS     X 2  . HIP ARTHROPLASTY Left 07/04/2017   Procedure: ARTHROPLASTY BIPOLAR HIP (HEMIARTHROPLASTY);  Surgeon: Tania Ade, MD;  Location: [place];  Service: Orthopedics;  Laterality: Left;  Marland Kitchen MELANOMA EXCISION Left 11/05/23, 11/30/13   CHEEK  . SEGMENTECOMY Left 11/25/2014   Procedure: LEFT LOWER LOBE SEGMENTECTOMY;  Surgeon: [name], MD;  Location: [place];  Service: Thoracic;  Laterality: Left;  Marland Kitchen VIDEO ASSISTED THORACOSCOPY Left 11/25/2014   Procedure: VIDEO ASSISTED THORACOSCOPY;  Surgeon: [name], MD;  Location: [place];  Service: Thoracic;  Laterality: Left;  Marland Kitchen VIDEO BRONCHOSCOPY WITH ENDOBRONCHIAL NAVIGATION N/A 06/19/2017   Procedure: VIDEO BRONCHOSCOPY;  Surgeon: [name], MD;  Location: [place];  Service: Thoracic;  Laterality: N/A;  . VIDEO BRONCHOSCOPY WITH ENDOBRONCHIAL ULTRASOUND N/A 11/25/2014   Procedure: VIDEO BRONCHOSCOPY WITH ENDOBRONCHIAL ULTRASOUND;  Surgeon: [name], MD;  Location: [place];  Service: Thoracic;  Laterality: N/A;    I have reviewed the social history and family history with the patient and they are unchanged from previous note.  ALLERGIES:  is allergic to beef-derived products, other, chocolate, ibuprofen, niacin and related, and tramadol.  MEDICATIONS:  Current Outpatient Medications  Medication Sig Dispense Refill  . atenolol (TENORMIN) 50 MG tablet Take 50 mg by mouth at bedtime.     Marland Kitchen b complex vitamins capsule Take 1 capsule by mouth daily.    . calcium-vitamin D (OSCAL WITH D) 500-200 MG-UNIT per tablet Take 1 tablet by mouth at bedtime.     . Cholecalciferol (VITAMIN D3) 1000  units CAPS Take by mouth daily.    . Digestive Enzymes (ENZYME DIGEST PO) Take 5 mLs by mouth 3 (three) times daily with meals.    . fish oil-omega-3 fatty acids 1000 MG capsule Take 1 g by mouth daily.     Marland Kitchen ketoconazole (NIZORAL) 2 % cream Apply 1 application topically 2 (two) times daily. 15 g 2  . levothyroxine (SYNTHROID, LEVOTHROID) 112 MCG tablet Take 112 mcg by mouth daily before breakfast.     . lisinopril (PRINIVIL,ZESTRIL) 10 MG tablet Take 1 tablet (10 mg total) by mouth daily. (Patient taking differently: Take 10 mg by mouth 2 (two) times daily. ) 30 tablet 1  . meclizine (ANTIVERT) 12.5 MG tablet Take 12.5 mg by mouth 3 (three) times daily as needed for dizziness.    . Multiple Vitamin (MULTIVITAMIN) tablet Take 1 tablet by mouth every morning.     . Omega-3 1000 MG CAPS Take 1 g by mouth daily.    . Polyvinyl Alcohol-Povidone (REFRESH OP) Apply 1 drop to eye as needed (DRY EYES).     . Triamcinolone Acetonide (NASACORT AQ NA) Place 1 spray into the nose at bedtime as needed (CONGESTION).    Marland Kitchen vitamin E (E-400) 400 UNIT capsule Take 400 Units by mouth every Monday, Wednesday, and Friday. Mon Wed Fri     No current facility-administered medications for this visit.    PHYSICAL EXAMINATION: ECOG PERFORMANCE STATUS: 0 - Asymptomatic  Vitals:   04/18/20 1325  BP: (!) 168/73  Pulse: 70  Resp: 18  Temp: (!) 97.5 F (36.4 C)  SpO2: 99%   Filed Weights   04/18/20 1325  Weight: 125 lb 11.2 oz (57 kg)    GENERAL:alert, no distress and comfortable SKIN: No rash EYES:  sclera clear NECK: Without mass LYMPH:  no palpable cervical or supraclavicular lymphadenopathy  LUNGS: clear with normal breathing effort HEART: regular rate & rhythm, no lower extremity edema NEURO: alert & oriented x 3 with fluent speech Breast exam: Breasts are symmetric without nipple discharge or inversion.  S/p right lumpectomy, incisions completely healed.  No palpable mass in either breast or axilla  that I could appreciate  LABORATORY DATA:  I have reviewed the data as listed CBC Latest Ref Rng & Units 04/18/2020 10/14/2019 07/09/2017  WBC 4.0 - 10.5 K/uL 4.9 4.4 6.1  Hemoglobin 12.0 - 15.0 g/dL 11.7(L) 12.8 10.9(A)  Hematocrit 36 - 46 % 36.3 39.0 33(A)  Platelets 150 - 400  K/uL 147(L) 163 218     CMP Latest Ref Rng & Units 04/18/2020 10/14/2019 10/03/2018  Glucose 70 - 99 mg/dL 110(H) 104(H) -  BUN 8 - 23 mg/dL _0 Creatinine 0.44 - 1.00 mg/dL 0.73 0.87 0.74  Sodium 135 - 145 mmol/L 142 143 -  Potassium 3.5 - 5.1 mmol/L 4.4 4.7 -  Chloride 98 - 111 mmol/L 106 104 -  CO2 22 - 32 mmol/L 31 29 -  Calcium 8.9 - 10.3 mg/dL 8.9 9.2 -  Total Protein 6.5 - 8.1 g/dL 7.0 7.2 -  Total Bilirubin 0.3 - 1.2 mg/dL 0.6 0.5 -  Alkaline Phos 38 - 126 U/L 84 99 -  AST 15 - 41 U/L 19 24 -  ALT 0 - 44 U/L 15 21 -      RADIOGRAPHIC STUDIES: I have personally reviewed the radiological images as listed and agreed with the findings in the report. No results found.   ASSESSMENT & PLAN: Melissa Snyder is a 86 y.o. female with    1. Stage I left lung adenocarcinoma, pT1aN0M0, recurrent LLL lung adenocarcinoma in 06/2017, new lung nodules -Diagnosed in 11/2014. Herleft lower lobe segmentectomy showed stage I adenocarcinoma -Given the early stage of lung cancer, adjuvant chemotherapy was not indicated. -She had cancer recurrence inLLL in1/2019.S/P radiation in 07/2017 with Dr. Lisbeth Renshaw. -She was followed by Dr. Roxan Hockey for surveillance briefly  -SurveillanceCT chest 06/18/19 showed 2 left lung nodules have increased in sizeand mild fluid in left lung.Thoracic tumor board recommendation for PET scan -Her PET from2/2/21 showed no evidence of hypermetabolic local tumor recurrence in left lung and no hypermetabolic distant metastasis.Her left lungnodules are too smalland probably below PET scan sensitivity, overall no high suspicion for recurrence.  -Surveillance CT chest 10/14/19  indicates stable/slightly improved lung nodules, no new nodules. Will continue monitoring   2. Right breast invasive ductal carcinoma, pT1b N0 M0 stage IA, ER positive PR positive and HER-2 negative. -Diagnosed in 05/2014. S/p right lumpectomy and adjuvant radiation. -She declined Dr. Ernestina Penna recommendation for antiestrogen in 2016 due to cost and concern with side effects.  -06/2019 mammogram normal, next due in 06/2020 -Continue breast cancer surveillance   Disposition: Ms. Martell is clinically doing well.  We reviewed her surveillance CT chest which shows stable bilateral lung nodules, new 5 mm pleural-based nodule in the right lung, overall not highly concerning for malignancy.  We will continue monitoring, repeat CT chest in 6 months.  I will CC my note to Dr. Roxan Hockey.  Breast exam is benign, CBC and CMP are normal except Hgb 11.7, PLT 147.  CBC normal in 02/2020 at PCP's office. She takes a multivitamin with iron.  There is no clinical concern for breast cancer recurrence.  Continue surveillance, next annual mammogram in 06/2020 which I recommend to be done at least 6-8 weeks after COVID-19 booster.  She will return for routine surveillance visit in 6 months, few days after CT chest.   Orders Placed This Encounter  Procedures  . MM DIAG BREAST TOMO BILATERAL    verify if patient received covid19 booster in 04/2020 (she was planning to when i saw her on 04/18/20)    Standing Status:   Future    Standing Expiration Date:   04/18/2021    Order Specific Question:   Reason for Exam (SYMPTOM  OR DIAGNOSIS REQUIRED)    Answer:   h/o right breast cancer 2016    Order Specific Question:   Preferred imaging location?  Answer:   [facility]  . CT Chest Wo Contrast    Standing Status:   Future    Standing Expiration Date:   04/18/2021    Order Specific Question:   Preferred imaging location?    Answer:   [facility]   All questions were answered. The patient knows to call  the clinic with any problems, questions or concerns. No barriers to learning were detected.     Alla Feeling, NP 04/18/20

## 2020-04-18 ENCOUNTER — Other Ambulatory Visit: Payer: Self-pay

## 2020-04-18 ENCOUNTER — Encounter: Payer: Self-pay | Admitting: Nurse Practitioner

## 2020-04-18 ENCOUNTER — Inpatient Hospital Stay: Payer: Medicare HMO

## 2020-04-18 ENCOUNTER — Inpatient Hospital Stay: Payer: Medicare HMO | Attending: Nurse Practitioner | Admitting: Nurse Practitioner

## 2020-04-18 VITALS — BP 168/73 | HR 70 | Temp 97.5°F | Resp 18 | Ht 65.25 in | Wt 125.7 lb

## 2020-04-18 DIAGNOSIS — C50411 Malignant neoplasm of upper-outer quadrant of right female breast: Secondary | ICD-10-CM

## 2020-04-18 DIAGNOSIS — Z79899 Other long term (current) drug therapy: Secondary | ICD-10-CM | POA: Diagnosis not present

## 2020-04-18 DIAGNOSIS — C3490 Malignant neoplasm of unspecified part of unspecified bronchus or lung: Secondary | ICD-10-CM | POA: Diagnosis not present

## 2020-04-18 DIAGNOSIS — Z170 Estrogen receptor positive status [ER+]: Secondary | ICD-10-CM

## 2020-04-18 DIAGNOSIS — C3432 Malignant neoplasm of lower lobe, left bronchus or lung: Secondary | ICD-10-CM

## 2020-04-18 LAB — CBC WITH DIFFERENTIAL (CANCER CENTER ONLY)
Abs Immature Granulocytes: 0 10*3/uL (ref 0.00–0.07)
Basophils Absolute: 0 10*3/uL (ref 0.0–0.1)
Basophils Relative: 1 %
Eosinophils Absolute: 0.3 10*3/uL (ref 0.0–0.5)
Eosinophils Relative: 6 %
HCT: 36.3 % (ref 36.0–46.0)
Hemoglobin: 11.7 g/dL — ABNORMAL LOW (ref 12.0–15.0)
Immature Granulocytes: 0 %
Lymphocytes Relative: 23 %
Lymphs Abs: 1.1 10*3/uL (ref 0.7–4.0)
MCH: 29.3 pg (ref 26.0–34.0)
MCHC: 32.2 g/dL (ref 30.0–36.0)
MCV: 91 fL (ref 80.0–100.0)
Monocytes Absolute: 0.4 10*3/uL (ref 0.1–1.0)
Monocytes Relative: 9 %
Neutro Abs: 3 10*3/uL (ref 1.7–7.7)
Neutrophils Relative %: 61 %
Platelet Count: 147 10*3/uL — ABNORMAL LOW (ref 150–400)
RBC: 3.99 MIL/uL (ref 3.87–5.11)
RDW: 13.3 % (ref 11.5–15.5)
WBC Count: 4.9 10*3/uL (ref 4.0–10.5)
nRBC: 0 % (ref 0.0–0.2)

## 2020-04-18 LAB — CMP (CANCER CENTER ONLY)
ALT: 15 U/L (ref 0–44)
AST: 19 U/L (ref 15–41)
Albumin: 3.8 g/dL (ref 3.5–5.0)
Alkaline Phosphatase: 84 U/L (ref 38–126)
Anion gap: 5 (ref 5–15)
BUN: 18 mg/dL (ref 8–23)
CO2: 31 mmol/L (ref 22–32)
Calcium: 8.9 mg/dL (ref 8.9–10.3)
Chloride: 106 mmol/L (ref 98–111)
Creatinine: 0.73 mg/dL (ref 0.44–1.00)
GFR, Estimated: >60 mL/min (ref >60)
Glucose, Bld: 110 mg/dL — ABNORMAL HIGH (ref 70–99)
Potassium: 4.4 mmol/L (ref 3.5–5.1)
Sodium: 142 mmol/L (ref 135–145)
Total Bilirubin: 0.6 mg/dL (ref 0.3–1.2)
Total Protein: 7 g/dL (ref 6.5–8.1)

## 2020-04-19 ENCOUNTER — Telehealth: Payer: Self-pay | Admitting: Nurse Practitioner

## 2020-04-19 ENCOUNTER — Telehealth: Payer: Self-pay | Admitting: *Deleted

## 2020-04-19 NOTE — Telephone Encounter (Signed)
Scheduled per 11/15 los. Unable to reach pt. Left voicemail with appt time and date.

## 2020-04-19 NOTE — Telephone Encounter (Signed)
Pt called stating that she received a call from [place] but didn't understand everything about an appt.  Returned call & gave next appt info & she also wants a copy of CT mailed to her b/c she left her copy in the BR at our office.  Informed that we would mail copy.

## 2020-06-10 ENCOUNTER — Encounter (HOSPITAL_COMMUNITY): Payer: Self-pay

## 2020-06-10 ENCOUNTER — Other Ambulatory Visit: Payer: Self-pay

## 2020-06-10 ENCOUNTER — Ambulatory Visit (HOSPITAL_COMMUNITY)
Admission: EM | Admit: 2020-06-10 | Discharge: 2020-06-10 | Disposition: A | Discharge status: Home / Self Care | Payer: Medicare HMO | Attending: Urgent Care | Admitting: Urgent Care

## 2020-06-10 DIAGNOSIS — S61411A Laceration without foreign body of right hand, initial encounter: Secondary | ICD-10-CM

## 2020-06-10 DIAGNOSIS — M79641 Pain in right hand: Secondary | ICD-10-CM

## 2020-06-10 DIAGNOSIS — Z23 Encounter for immunization: Secondary | ICD-10-CM

## 2020-06-10 MED ORDER — LIDOCAINE HCL (PF) 2 % IJ SOLN
INTRAMUSCULAR | Status: AC
  Filled 2020-06-10: qty 5

## 2020-06-10 MED ORDER — TETANUS-DIPHTH-ACELL PERTUSSIS 5-2.5-18.5 LF-MCG/0.5 IM SUSY
0.5000 mL | PREFILLED_SYRINGE | Freq: Once | INTRAMUSCULAR | Status: AC
  Administered 2020-06-10: 0.5 mL via INTRAMUSCULAR

## 2020-06-10 MED ORDER — TETANUS-DIPHTH-ACELL PERTUSSIS 5-2.5-18.5 LF-MCG/0.5 IM SUSY
PREFILLED_SYRINGE | INTRAMUSCULAR | Status: AC
  Filled 2020-06-10: qty 0.5

## 2020-06-10 NOTE — ED Triage Notes (Signed)
Pressure dsy applied to lac on RT hand at base of Rt thumb. Bleeding controlled . Pt reports she will need a Tdap .

## 2020-06-10 NOTE — ED Provider Notes (Signed)
[place]   MRN: [id number] DOB: Dec 08, 1933  Subjective:   Melissa Snyder is a 87 y.o. female presenting for suffering a right hand laceration this morning while prepping chicken.  Patient cannot recall her last Tdap.  She cleaned her wound and came straight to our clinic.  Denies loss range of motion for her right thumb or loss of sensation.  No current facility-administered medications for this encounter.  Current Outpatient Medications:  .  atenolol (TENORMIN) 50 MG tablet, Take 50 mg by mouth at bedtime. , Disp: , Rfl:  .  b complex vitamins capsule, Take 1 capsule by mouth daily., Disp: , Rfl:  .  calcium-vitamin D (OSCAL WITH D) 500-200 MG-UNIT per tablet, Take 1 tablet by mouth at bedtime. , Disp: , Rfl:  .  Cholecalciferol (VITAMIN D3) 1000 units CAPS, Take by mouth daily., Disp: , Rfl:  .  Digestive Enzymes (ENZYME DIGEST PO), Take 5 mLs by mouth 3 (three) times daily with meals., Disp: , Rfl:  .  fish oil-omega-3 fatty acids 1000 MG capsule, Take 1 g by mouth daily. , Disp: , Rfl:  .  ketoconazole (NIZORAL) 2 % cream, Apply 1 application topically 2 (two) times daily., Disp: 15 g, Rfl: 2 .  levothyroxine (SYNTHROID, LEVOTHROID) 112 MCG tablet, Take 112 mcg by mouth daily before breakfast. , Disp: , Rfl:  .  lisinopril (PRINIVIL,ZESTRIL) 10 MG tablet, Take 1 tablet (10 mg total) by mouth daily. (Patient taking differently: Take 10 mg by mouth 2 (two) times daily. ), Disp: 30 tablet, Rfl: 1 .  meclizine (ANTIVERT) 12.5 MG tablet, Take 12.5 mg by mouth 3 (three) times daily as needed for dizziness., Disp: , Rfl:  .  Multiple Vitamin (MULTIVITAMIN) tablet, Take 1 tablet by mouth every morning. , Disp: , Rfl:  .  Omega-3 1000 MG CAPS, Take 1 g by mouth daily., Disp: , Rfl:  .  Polyvinyl Alcohol-Povidone (REFRESH OP), Apply 1 drop to eye as needed (DRY EYES). , Disp: , Rfl:  .  Triamcinolone Acetonide (NASACORT AQ NA), Place 1 spray into the nose at bedtime as  needed (CONGESTION)., Disp: , Rfl:  .  vitamin E (E-400) 400 UNIT capsule, Take 400 Units by mouth every Monday, Wednesday, and Friday. Mon Wed Fri, Disp: , Rfl:    Allergies  Allergen Reactions  . Beef-Derived Products Other (See Comments)    ACID REFLUX  . Other     FRIED FOODS UNSPECIFIED REACTION   . Chocolate Other (See Comments)    Acid Reflux  . Ibuprofen Rash and Other (See Comments)    Causes mouth sores  . Niacin And Related Other (See Comments)    Flushing, burning, redness > REACTIONS NOT ALLERGIC   . Tramadol Itching    Past Medical History:  Diagnosis Date  . Adenocarcinoma ([place])    recurrent/notes 07/03/2017  . Blind left eye 10/2013   cva  . Breast cancer ([place]) 05/2014   ER+/PR+/Her2-     LUNG CANCER  . Bundle branch block left   . Chest pain   . Family history of bladder cancer   . Family history of breast cancer   . Family history of prostate cancer   . GERD (gastroesophageal reflux disease)   . History of blood transfusion    age 7  . Hyperlipidemia   . Hypertension   . Hypothyroid   . Lung nodule   . Melanoma in situ of face [facility]) JUNE 2015  LEFT CHEEK  . Meningioma ([place])    brain lining  . Migraine    reports only having one  . Osteoarthritis   . Personal history of radiation therapy   . Radiation 09/16/14-10/07/14   Right  Breast  21 fractions  . Skin cancer   . Stroke [facility]) 2015   Blind in left eye as a result     Past Surgical History:  Procedure Laterality Date  . ABDOMINAL HYSTERECTOMY  ~ 1997  . APPENDECTOMY     age 65  . BILATERAL SALPINGOOPHORECTOMY    . BLADDER REPAIR  2009   cystocele  . BRAIN MENINGIOMA EXCISION     Gamma knife to Meningioma in brain lining  . BREAST BIOPSY    . BREAST LUMPECTOMY Right 07/23/2014   invasive ductal  . BUNIONECTOMY     bilateral great toe  . CARDIAC CATHETERIZATION  2004  . CHOLECYSTECTOMY  ~ 1997  . COLONOSCOPY    . CRYO INTERCOSTAL NERVE BLOCK Left 11/25/2014   Procedure: CRYO  INTERCOSTAL NERVE BLOCK;  Surgeon: [name], MD;  Location: [place];  Service: Thoracic;  Laterality: Left;  . CT RADIATION THERAPY GUIDE     Gamma radiation -lt frontal-[facility]  . DILATION AND CURETTAGE OF UTERUS     X 2  . HIP ARTHROPLASTY Left 07/04/2017   Procedure: ARTHROPLASTY BIPOLAR HIP (HEMIARTHROPLASTY);  Surgeon: Tania Ade, MD;  Location: [place];  Service: Orthopedics;  Laterality: Left;  Marland Kitchen MELANOMA EXCISION Left 11/05/23, 11/30/13   CHEEK  . SEGMENTECOMY Left 11/25/2014   Procedure: LEFT LOWER LOBE SEGMENTECTOMY;  Surgeon: [name], MD;  Location: [place];  Service: Thoracic;  Laterality: Left;  Marland Kitchen VIDEO ASSISTED THORACOSCOPY Left 11/25/2014   Procedure: VIDEO ASSISTED THORACOSCOPY;  Surgeon: [name], MD;  Location: [place];  Service: Thoracic;  Laterality: Left;  Marland Kitchen VIDEO BRONCHOSCOPY WITH ENDOBRONCHIAL NAVIGATION N/A 06/19/2017   Procedure: VIDEO BRONCHOSCOPY;  Surgeon: [name], MD;  Location: [place];  Service: Thoracic;  Laterality: N/A;  . VIDEO BRONCHOSCOPY WITH ENDOBRONCHIAL ULTRASOUND N/A 11/25/2014   Procedure: VIDEO BRONCHOSCOPY WITH ENDOBRONCHIAL ULTRASOUND;  Surgeon: [name], MD;  Location: [place];  Service: Thoracic;  Laterality: N/A;    Family History  Problem Relation Age of Onset  . CAD Mother        CABG  . Hyperlipidemia Mother   . AAA (abdominal aortic aneurysm) Mother   . Renal Disease Father   . Kidney disease Father   . CAD Father   . Cancer Father        PROSTATE  . Diabetes Brother   . Hyperlipidemia Brother   . Breast cancer Daughter 100  . Breast cancer Sister 40  . Breast cancer Sister 82  . Cancer Brother        NOS  . Bladder Cancer Brother 84  . Lung cancer Cousin        3 paternal cousins with lung cancer  . Breast cancer Cousin        five paternal first cousins  . Cancer Cousin        4 paternal first cousins with Cancer NOS  . Cancer Cousin        1 paternal cousin with oral  cancer (tongue)    Social History   Tobacco Use  . Smoking status: Never Smoker  . Smokeless tobacco: Never Used  Vaping Use  . Vaping Use: Never used  Substance Use Topics  .  Alcohol use: No  . Drug use: No    ROS   Objective:   Vitals: BP (!) 171/67 (BP Location: Right Arm)   Pulse (!) 2   Resp 17   SpO2 99%   Physical Exam Constitutional:      General: She is not in acute distress.    Appearance: Normal appearance. She is well-developed. She is not ill-appearing, toxic-appearing or diaphoretic.  HENT:     Head: Normocephalic and atraumatic.     Nose: Nose normal.     Mouth/Throat:     Mouth: Mucous membranes are moist.     Pharynx: Oropharynx is clear.  Eyes:     General: No scleral icterus.       Right eye: No discharge.        Left eye: No discharge.     Extraocular Movements: Extraocular movements intact.     Conjunctiva/sclera: Conjunctivae normal.     Pupils: Pupils are equal, round, and reactive to light.  Cardiovascular:     Rate and Rhythm: Normal rate.  Pulmonary:     Effort: Pulmonary effort is normal.  Musculoskeletal:       Hands:  Skin:    General: Skin is warm and dry.  Neurological:     General: No focal deficit present.     Mental Status: She is alert and oriented to person, place, and time.  Psychiatric:        Mood and Affect: Mood normal.        Behavior: Behavior normal.        Thought Content: Thought content normal.        Judgment: Judgment normal.     PROCEDURE NOTE: laceration repair Verbal consent obtained from patient.  Local anesthesia with 3cc Lidocaine 2% without epinephrine.  Wound explored for tendon, ligament damage. Wound scrubbed with soap and water and rinsed. Wound closed with #3 5-0 Ethilon (2 simple interrupted, 1 horizontal mattress) sutures.  Wound cleansed and dressed.  Tdap updated.  Assessment and Plan :   PDMP not reviewed this encounter.  1. Right hand pain   2. Laceration of right hand without  foreign body, initial encounter     Laceration repaired successfully. Wound care reviewed.  Use Tylenol for pain control.  Return-to-clinic precautions discussed, patient verbalized understanding. Otherwise, follow up in 10 days for suture removal.  Counseled patient on potential for adverse effects with medications prescribed/recommended today, ER and return-to-clinic precautions discussed, patient verbalized understanding.     Jaynee Eagles, PA-C 06/10/20 [id number]

## 2020-06-10 NOTE — Discharge Instructions (Signed)

## 2020-06-10 NOTE — ED Triage Notes (Signed)
Pt in with c.o right thumb laceration that occurred around 10 am when she was cutting chicken  Hand was bandaged upon arrival to [facility]  Denies numbness to finger

## 2020-08-03 ENCOUNTER — Other Ambulatory Visit: Payer: Self-pay

## 2020-08-03 ENCOUNTER — Other Ambulatory Visit: Payer: Self-pay | Admitting: Nurse Practitioner

## 2020-08-03 ENCOUNTER — Ambulatory Visit
Admission: RE | Admit: 2020-08-03 | Discharge: 2020-08-03 | Disposition: A | Discharge status: Home / Self Care | Payer: Medicare HMO | Source: Ambulatory Visit | Attending: Nurse Practitioner | Admitting: Nurse Practitioner

## 2020-08-03 DIAGNOSIS — Z1231 Encounter for screening mammogram for malignant neoplasm of breast: Secondary | ICD-10-CM | POA: Diagnosis not present

## 2020-08-03 DIAGNOSIS — Z170 Estrogen receptor positive status [ER+]: Secondary | ICD-10-CM

## 2020-08-03 DIAGNOSIS — C50411 Malignant neoplasm of upper-outer quadrant of right female breast: Secondary | ICD-10-CM

## 2020-08-08 DIAGNOSIS — C50919 Malignant neoplasm of unspecified site of unspecified female breast: Secondary | ICD-10-CM | POA: Diagnosis not present

## 2020-08-08 DIAGNOSIS — Q766 Other congenital malformations of ribs: Secondary | ICD-10-CM | POA: Diagnosis not present

## 2020-08-08 DIAGNOSIS — C439 Malignant melanoma of skin, unspecified: Secondary | ICD-10-CM | POA: Diagnosis not present

## 2020-08-08 DIAGNOSIS — C3490 Malignant neoplasm of unspecified part of unspecified bronchus or lung: Secondary | ICD-10-CM | POA: Diagnosis not present

## 2020-08-08 DIAGNOSIS — I10 Essential (primary) hypertension: Secondary | ICD-10-CM | POA: Diagnosis not present

## 2020-08-09 ENCOUNTER — Other Ambulatory Visit: Payer: Self-pay | Admitting: Nurse Practitioner

## 2020-08-09 DIAGNOSIS — R928 Other abnormal and inconclusive findings on diagnostic imaging of breast: Secondary | ICD-10-CM

## 2020-08-23 ENCOUNTER — Ambulatory Visit
Admission: RE | Admit: 2020-08-23 | Discharge: 2020-08-23 | Disposition: A | Discharge status: Home / Self Care | Payer: Medicare HMO | Source: Ambulatory Visit | Attending: Nurse Practitioner | Admitting: Nurse Practitioner

## 2020-08-23 ENCOUNTER — Other Ambulatory Visit: Payer: Self-pay

## 2020-08-23 ENCOUNTER — Ambulatory Visit: Payer: Medicare HMO

## 2020-08-23 DIAGNOSIS — R922 Inconclusive mammogram: Secondary | ICD-10-CM | POA: Diagnosis not present

## 2020-08-23 DIAGNOSIS — R928 Other abnormal and inconclusive findings on diagnostic imaging of breast: Secondary | ICD-10-CM

## 2020-08-23 DIAGNOSIS — R921 Mammographic calcification found on diagnostic imaging of breast: Secondary | ICD-10-CM | POA: Diagnosis not present

## 2020-08-30 DIAGNOSIS — R42 Dizziness and giddiness: Secondary | ICD-10-CM | POA: Diagnosis not present

## 2020-08-30 DIAGNOSIS — Z0130 Encounter for examination of blood pressure without abnormal findings: Secondary | ICD-10-CM | POA: Diagnosis not present

## 2020-08-30 DIAGNOSIS — I10 Essential (primary) hypertension: Secondary | ICD-10-CM | POA: Diagnosis not present

## 2020-08-31 DIAGNOSIS — J302 Other seasonal allergic rhinitis: Secondary | ICD-10-CM | POA: Diagnosis not present

## 2020-08-31 DIAGNOSIS — M1990 Unspecified osteoarthritis, unspecified site: Secondary | ICD-10-CM | POA: Diagnosis not present

## 2020-08-31 DIAGNOSIS — N3941 Urge incontinence: Secondary | ICD-10-CM | POA: Diagnosis not present

## 2020-08-31 DIAGNOSIS — H548 Legal blindness, as defined in USA: Secondary | ICD-10-CM | POA: Diagnosis not present

## 2020-08-31 DIAGNOSIS — I10 Essential (primary) hypertension: Secondary | ICD-10-CM | POA: Diagnosis not present

## 2020-08-31 DIAGNOSIS — Z79899 Other long term (current) drug therapy: Secondary | ICD-10-CM | POA: Diagnosis not present

## 2020-08-31 DIAGNOSIS — M810 Age-related osteoporosis without current pathological fracture: Secondary | ICD-10-CM | POA: Diagnosis not present

## 2020-08-31 DIAGNOSIS — K219 Gastro-esophageal reflux disease without esophagitis: Secondary | ICD-10-CM | POA: Diagnosis not present

## 2020-08-31 DIAGNOSIS — Z008 Encounter for other general examination: Secondary | ICD-10-CM | POA: Diagnosis not present

## 2020-08-31 DIAGNOSIS — E039 Hypothyroidism, unspecified: Secondary | ICD-10-CM | POA: Diagnosis not present

## 2020-08-31 DIAGNOSIS — I2510 Atherosclerotic heart disease of native coronary artery without angina pectoris: Secondary | ICD-10-CM | POA: Diagnosis not present

## 2020-09-20 ENCOUNTER — Telehealth: Payer: Self-pay

## 2020-09-20 NOTE — Telephone Encounter (Signed)
I tried calling the patient to notify her that we scheduled her CT scan but got her VM. Per ROI I left a detailed message on her answering machine stating that her CT scan had been scheduled for 10/10/20 at 11:30 am but to arrive to [facility] by 11:15 am. I advised the patient to contact us with any questions or concerns.

## 2020-09-20 NOTE — Telephone Encounter (Signed)
-----   Message from Royston Bake, RN sent at 09/20/2020 10:47 AM EDT -----  ----- Message ----- From: Alla Feeling, NP Sent: 09/18/2020  10:12 PM EDT To: Royston Bake, RN, Aundria Rud, #  Thanks.  My nurse, please schedule CT chest before f/up 5/16.   Thanks, Regan Rakers  ----- Message ----- From: Aundria Rud Sent: 09/06/2020   4:34 PM EDT To: Alla Feeling, NP, Royston Bake, RN  Referral authorized ----- Message ----- From: Alla Feeling, NP Sent: 07/11/2020   2:45 PM EDT To: Royston Bake, RN, [place],  Please keep her CT chest on your radar to get approved and scheduled before f/up with Dr. Burr Medico on 10/17/20.   Thanks, Regan Rakers

## 2020-10-10 ENCOUNTER — Other Ambulatory Visit: Payer: Self-pay

## 2020-10-10 ENCOUNTER — Ambulatory Visit (HOSPITAL_COMMUNITY)
Admission: RE | Admit: 2020-10-10 | Discharge: 2020-10-10 | Disposition: A | Discharge status: Home / Self Care | Payer: Medicare HMO | Source: Ambulatory Visit | Attending: Nurse Practitioner | Admitting: Nurse Practitioner

## 2020-10-10 ENCOUNTER — Inpatient Hospital Stay: Payer: Medicare HMO | Attending: Hematology

## 2020-10-10 DIAGNOSIS — C50411 Malignant neoplasm of upper-outer quadrant of right female breast: Secondary | ICD-10-CM

## 2020-10-10 DIAGNOSIS — Z85118 Personal history of other malignant neoplasm of bronchus and lung: Secondary | ICD-10-CM | POA: Diagnosis not present

## 2020-10-10 DIAGNOSIS — C3432 Malignant neoplasm of lower lobe, left bronchus or lung: Secondary | ICD-10-CM

## 2020-10-10 DIAGNOSIS — I2510 Atherosclerotic heart disease of native coronary artery without angina pectoris: Secondary | ICD-10-CM | POA: Diagnosis not present

## 2020-10-10 DIAGNOSIS — Z170 Estrogen receptor positive status [ER+]: Secondary | ICD-10-CM

## 2020-10-10 DIAGNOSIS — C50912 Malignant neoplasm of unspecified site of left female breast: Secondary | ICD-10-CM | POA: Diagnosis not present

## 2020-10-10 DIAGNOSIS — C3490 Malignant neoplasm of unspecified part of unspecified bronchus or lung: Secondary | ICD-10-CM | POA: Insufficient documentation

## 2020-10-10 DIAGNOSIS — Z853 Personal history of malignant neoplasm of breast: Secondary | ICD-10-CM | POA: Insufficient documentation

## 2020-10-10 DIAGNOSIS — J439 Emphysema, unspecified: Secondary | ICD-10-CM | POA: Diagnosis not present

## 2020-10-10 DIAGNOSIS — Z923 Personal history of irradiation: Secondary | ICD-10-CM | POA: Diagnosis not present

## 2020-10-10 DIAGNOSIS — C3492 Malignant neoplasm of unspecified part of left bronchus or lung: Secondary | ICD-10-CM | POA: Diagnosis not present

## 2020-10-10 LAB — CBC WITH DIFFERENTIAL (CANCER CENTER ONLY)
Abs Immature Granulocytes: 0.01 10*3/uL (ref 0.00–0.07)
Basophils Absolute: 0 10*3/uL (ref 0.0–0.1)
Basophils Relative: 1 %
Eosinophils Absolute: 0.2 10*3/uL (ref 0.0–0.5)
Eosinophils Relative: 4 %
HCT: 38.1 % (ref 36.0–46.0)
Hemoglobin: 12.4 g/dL (ref 12.0–15.0)
Immature Granulocytes: 0 %
Lymphocytes Relative: 20 %
Lymphs Abs: 0.9 10*3/uL (ref 0.7–4.0)
MCH: 29.4 pg (ref 26.0–34.0)
MCHC: 32.5 g/dL (ref 30.0–36.0)
MCV: 90.3 fL (ref 80.0–100.0)
Monocytes Absolute: 0.4 10*3/uL (ref 0.1–1.0)
Monocytes Relative: 9 %
Neutro Abs: 3 10*3/uL (ref 1.7–7.7)
Neutrophils Relative %: 66 %
Platelet Count: 163 10*3/uL (ref 150–400)
RBC: 4.22 MIL/uL (ref 3.87–5.11)
RDW: 13.6 % (ref 11.5–15.5)
WBC Count: 4.5 10*3/uL (ref 4.0–10.5)
nRBC: 0 % (ref 0.0–0.2)

## 2020-10-10 LAB — CMP (CANCER CENTER ONLY)
ALT: 17 U/L (ref 0–44)
AST: 23 U/L (ref 15–41)
Albumin: 4 g/dL (ref 3.5–5.0)
Alkaline Phosphatase: 91 U/L (ref 38–126)
Anion gap: 10 (ref 5–15)
BUN: 16 mg/dL (ref 8–23)
CO2: 29 mmol/L (ref 22–32)
Calcium: 9.4 mg/dL (ref 8.9–10.3)
Chloride: 102 mmol/L (ref 98–111)
Creatinine: 0.77 mg/dL (ref 0.44–1.00)
GFR, Estimated: >60 mL/min (ref >60)
Glucose, Bld: 86 mg/dL (ref 70–99)
Potassium: 4.2 mmol/L (ref 3.5–5.1)
Sodium: 141 mmol/L (ref 135–145)
Total Bilirubin: 0.6 mg/dL (ref 0.3–1.2)
Total Protein: 7.2 g/dL (ref 6.5–8.1)

## 2020-10-12 NOTE — Progress Notes (Incomplete)
[place]   Telephone:([phone] Fax:(336) [phone]   Clinic Follow up Note   Patient Care Team: [name], MD as PCP - General (Internal Medicine) [name], MD as Consulting Physician (Cardiothoracic Surgery) [name], MD as Consulting Physician (Urology) [name], MD as Consulting Physician (Hematology) [name], MD as Consulting Physician (Radiation Oncology) Hayden Pedro, PA-C as Physician Assistant (Radiation Oncology)  Date of Service:  10/12/2020  CHIEF COMPLAINT: Follow up right breast cancer  SUMMARY OF ONCOLOGIC HISTORY: Oncology History Overview Note  Breast cancer (Right breast)   Staging form: Breast, AJCC 7th Edition     Pathologic stage from 10/17/2014: Stage IA (T1b, N0, cM0) - Signed by [name], MD on 10/17/2014 Lung cancer (LLL)   Staging form: Lung, AJCC 7th Edition     Clinical stage from 12/14/2014: Stage IA (T1a, N0, M0) - Unsigned     Breast cancer of upper-outer quadrant of right female breast ([place])  05/04/2014 Initial Diagnosis   Breast cancer   05/19/2014 Imaging   Diagnostic mammogram and ultrasound showed a 7 mm irregular suspicious mass located in the right breast at the 10:00 position, 7 cm from the nipple. Otherwise negative.   05/19/2014 Initial Biopsy   Right breast biopsy ([facility]): IDC, grade 2.    05/19/2014 Receptors her2   ER 100% positive, PR 35% positive, HER-2 negative, Ki67 8%   06/07/2014 Miscellaneous   Genetic testing normal.  APC, ATM, BARD1, BMPR1A, BRCA1, BRCA2, BRIP1, CDH1, CDK4, CDKN2A, CHEK2, EPCAM, GREM1, MLH1, MRE11A, MSH2, MSH6, MUTYH, NBN, NF1, PALB2, PMS2, POLD1, POLE, PTEN, RAD50, RAD51D, SMAD4, SMARCA4, STK11, and TP53 tested.    07/23/2014 Surgery   Right breast lumpectomy with SLNB Lucia Gaskins) showed invasive ductal carcinoma & DCIS; negative surgical margins.   07/23/2014 Pathology Results   Invasive ductal carcinoma, tumor size 0.8 cm, grade 1, no  lymphovascular invasion, margins were negative, 2 sentinel lymph nodes were negative. (+) DCIS.   07/23/2014 Pathologic Stage   pT1b, pN0: Stage IA    09/16/2014 - 10/07/2014 Radiation Therapy   XRT completed Pablo Ledger). Right breast/ 42.72 Gy at 2.67 Gy per fraction x 21 fractions.     Anti-estrogen oral therapy   Aromasin prescribed in 01/2015 but Patient declines anti-estrogen therapy at this time d/t high co-pay of medication and possible side effects of AI therapy.    05/02/2015 Mammogram   Diagnostic TOMO bilat mammogram: No evidence of malignancy in either breast. Lumpectomy changes in right breast. Diagnostic mammo suggested in 1 year.    Primary cancer of left lower lobe of lung ([place])  10/25/2013 Imaging   CXR done for stroke protocol. No acute chest findings. Chronic lung disease with biapical scarring. Developing nodule in (L) apex cannot be excluded based on exam, athough may be d/t overlap of osseous structures.    10/27/2013 Imaging   CXR: Nodular opacity in mid (L) apex and appears more prominent than prior study. Area that appeared masslike in LUL near apex on recent CXR not seen at this time.  Underlying emphysema present. No edema or consolidation   11/12/2013 Imaging   CT chest: At left lung apex, there is asymmetric pleural parenchymal scarring. No discrete lesion.  In superior segment of LLL, there are 1 dominant ground-glass opacity measuring 16 mm. 2 addt'l ground-glass opacities appreciated. F/U with CT in 3 months   02/11/2014 Imaging   CT chest: Several small bilat faint nodular densities, largest measures 1.3 cm over superior segment LLL. No  new nodules, no adenopathy. Cannot exclude metastatic disease given pt's h/o melanoma. Recommend PET-CT   02/23/2014 PET scan   Persistent semi-solid nodule in superior segment LLL with low metabolic activity. Moderately metabolic subcarinal & hilar LN are likely reactive.    05/25/2014 Imaging   CT chest: Persistent & similar  size dominant LLL sub-solid nodule. Adenocarcinoma is considered. Biopsy or resection strongly considered. Additional scattered small ground-glass nodules stable.    06/02/2014 Imaging   MRI brain: Grossly stable appearance of left planum sphenoidale meningioma measuring 20 x 14 mm. Meningioma does not extend to left orbital apex & contacts left optic nerve. Stable right posterior clinoid meningioma .    09/27/2014 Imaging   CT chest: Further enlargment of central solid component in sub-solid LLL pulm nodule. Findings remain concerning for adenoca. Additional scattered small bilat ground-glass nodules and biapical scarring stable. No adenopathy.     11/25/2014 Surgery   Video bronchoscopy with endobronchial ultrasound; Video assisted thoracoscopy; Thoracoscopic LLL superior segmentectomy; Mediastinal lymph node dissection; Cyro intercostal nerve block Roxan Hockey)   11/25/2014 Pathology Results   Superior segment lung resection (LLL). Adenocarcinoma, well-diff, spanning 1.5 cm. Surgical margins negative. 5 LNs negative.    11/25/2014 Pathologic Stage   pT1a, pN0: Stage IA    11/25/2014 Initial Diagnosis   Primary cancer of left lower lobe of lung ([place])   05/31/2015 Imaging   CT chest: No evidence of local recurrent at superior segmentectomy in LLL. New small nodular focus of consolidation surrounding thin parenchymal band. Additional sub-cm ground-glass and solid nodules favored to be benign. No thoracic adenopathy   08/11/2015 Imaging   CT chest: No evidence of residual or recurrent tumor in LLL. Stable small nodular focus in LLL with thin surrounding parenchymal band, favored to be benign.  Multiple small ground-glass nodules unchanged.    06/19/2017 Pathology Results   Diagnosis 1. Lung, biopsy, Left lower lobe - ADENOCARCINOMA. - SEE COMMENT. 2. Lung, biopsy, Left lower lobe - BENIGN LUNG PARENCHYMA. - THERE IS NO EVIDENCE OF MALIGNANCY.   07/09/2017 - 07/22/2017 Radiation Therapy    Radiation treatment dates: 07/09/17-07/22/17 by Dr. Lisbeth Renshaw    Site/dose:  Left lung/ 50 Gy in 10 fractions   Beams/energy:  IMRT/ 6X   Narrative: The patient tolerated radiation treatment relatively well. She denied pain, fatigue, or difficulty swallowing. Skin to the treatment area appears warm, dry, and normal in color.   06/18/2019 Imaging   CT Chest  IMPRESSION: 1. There are 2 round solid nodules within the left lower lobe which have increased in size when compared with the previous exam, suspicious. Additionally, along the area of postsurgical change around the oblique fissure of the left lung there is progressive thickening with persistent loculated fluid. Cannot rule out local tumor recurrence along the suture line. 2. No significant change in the appearance of bilateral upper lobe sub solid lung nodules. 3. Aortic Atherosclerosis (ICD10-I70.0). Coronary artery calcifications.     07/07/2019 PET scan   IMPRESSION: 1. No evidence of hypermetabolic local tumor recurrence in the left lung. Low level nonfocal uptake along the segmentectomy site in the left lower lobe favors post treatment change. Continued chest CT surveillance warranted. 2. Two small solid left lower lobe pulmonary nodules, largest 0.7 cm, below PET resolution. Given the growth of these nodules since 10/03/2018 chest CT, metastatic disease remains on the differential. Close chest CT surveillance recommended. 3. Nonspecific hypermetabolism within nonenlarged subcarinal and bilateral hilar lymph nodes, not substantially changed since 03/04/2017 PET-CT study, favoring  reactive uptake. 4. No hypermetabolic distant metastatic disease. 5. Chronic findings include: Aortic Atherosclerosis (ICD10-I70.0). Subcentimeter ground-glass pulmonary nodules in the upper lobes are unchanged and are below PET resolution. Moderate sigmoid diverticulosis. Chronic right maxillary sinusitis.      CURRENT THERAPY:   Surveillance  INTERVAL HISTORY: *** Melissa Snyder is here for a follow up of right breast cancer. She was last seen by me 1 year ago and seen by NP Lacie 6 months ago in interim. She presents to the clinic alone.   REVIEW OF SYSTEMS:  *** Constitutional: Denies fevers, chills or abnormal weight loss Eyes: Denies blurriness of vision Ears, nose, mouth, throat, and face: Denies mucositis or sore throat Respiratory: Denies cough, dyspnea or wheezes Cardiovascular: Denies palpitation, chest discomfort or lower extremity swelling Gastrointestinal:  Denies nausea, heartburn or change in bowel habits Skin: Denies abnormal skin rashes Lymphatics: Denies new lymphadenopathy or easy bruising Neurological:Denies numbness, tingling or new weaknesses Behavioral/Psych: Mood is stable, no new changes  All other systems were reviewed with the patient and are negative.  MEDICAL HISTORY:  Past Medical History:  Diagnosis Date  . Adenocarcinoma ([place])    recurrent/notes 07/03/2017  . Blind left eye 10/2013   cva  . Breast cancer ([place]) 05/2014   ER+/PR+/Her2-     LUNG CANCER  . Bundle branch block left   . Chest pain   . Family history of bladder cancer   . Family history of breast cancer   . Family history of prostate cancer   . GERD (gastroesophageal reflux disease)   . History of blood transfusion    age 52  . Hyperlipidemia   . Hypertension   . Hypothyroid   . Lung nodule   . Melanoma in situ of face ([place]) JUNE 2015   LEFT CHEEK  . Meningioma ([place])    brain lining  . Migraine    reports only having one  . Osteoarthritis   . Personal history of radiation therapy   . Radiation 09/16/14-10/07/14   Right  Breast  21 fractions  . Skin cancer   . Stroke [facility]) 2015   Blind in left eye as a result    SURGICAL HISTORY: Past Surgical History:  Procedure Laterality Date  . ABDOMINAL HYSTERECTOMY  ~ 1997  . APPENDECTOMY     age 89  . BILATERAL SALPINGOOPHORECTOMY    . BLADDER REPAIR   2009   cystocele  . BRAIN MENINGIOMA EXCISION     Gamma knife to Meningioma in brain lining  . BREAST BIOPSY    . BREAST LUMPECTOMY Right 07/23/2014   invasive ductal  . BUNIONECTOMY     bilateral great toe  . CARDIAC CATHETERIZATION  2004  . CHOLECYSTECTOMY  ~ 1997  . COLONOSCOPY    . CRYO INTERCOSTAL NERVE BLOCK Left 11/25/2014   Procedure: CRYO INTERCOSTAL NERVE BLOCK;  Surgeon: [name], MD;  Location: [facility];  Service: Thoracic;  Laterality: Left;  . CT RADIATION THERAPY GUIDE     Gamma radiation -lt frontal-[facility]  . DILATION AND CURETTAGE OF UTERUS     X 2  . HIP ARTHROPLASTY Left 07/04/2017   Procedure: ARTHROPLASTY BIPOLAR HIP (HEMIARTHROPLASTY);  Surgeon: Tania Ade, MD;  Location: [facility];  Service: Orthopedics;  Laterality: Left;  Marland Kitchen MELANOMA EXCISION Left 11/05/23, 11/30/13   CHEEK  . SEGMENTECOMY Left 11/25/2014   Procedure: LEFT LOWER LOBE SEGMENTECTOMY;  Surgeon: [name], MD;  Location: [facility];  Service: Thoracic;  Laterality: Left;  .  VIDEO ASSISTED THORACOSCOPY Left 11/25/2014   Procedure: VIDEO ASSISTED THORACOSCOPY;  Surgeon: [name], MD;  Location: [facility];  Service: Thoracic;  Laterality: Left;  Marland Kitchen VIDEO BRONCHOSCOPY WITH ENDOBRONCHIAL NAVIGATION N/A 06/19/2017   Procedure: VIDEO BRONCHOSCOPY;  Surgeon: [name], MD;  Location: [facility];  Service: Thoracic;  Laterality: N/A;  . VIDEO BRONCHOSCOPY WITH ENDOBRONCHIAL ULTRASOUND N/A 11/25/2014   Procedure: VIDEO BRONCHOSCOPY WITH ENDOBRONCHIAL ULTRASOUND;  Surgeon: [name], MD;  Location: [facility];  Service: Thoracic;  Laterality: N/A;    I have reviewed the social history and family history with the patient and they are unchanged from previous note.  ALLERGIES:  is allergic to beef-derived products, other, chocolate, ibuprofen, niacin and related, and tramadol.  MEDICATIONS:  Current Outpatient Medications  Medication Sig Dispense Refill  . atenolol (TENORMIN)  50 MG tablet Take 50 mg by mouth at bedtime.     Marland Kitchen b complex vitamins capsule Take 1 capsule by mouth daily.    . calcium-vitamin D (OSCAL WITH D) 500-200 MG-UNIT per tablet Take 1 tablet by mouth at bedtime.     . Cholecalciferol (VITAMIN D3) 1000 units CAPS Take by mouth daily.    . Digestive Enzymes (ENZYME DIGEST PO) Take 5 mLs by mouth 3 (three) times daily with meals.    . fish oil-omega-3 fatty acids 1000 MG capsule Take 1 g by mouth daily.     Marland Kitchen ketoconazole (NIZORAL) 2 % cream Apply 1 application topically 2 (two) times daily. 15 g 2  . levothyroxine (SYNTHROID, LEVOTHROID) 112 MCG tablet Take 112 mcg by mouth daily before breakfast.     . lisinopril (PRINIVIL,ZESTRIL) 10 MG tablet Take 1 tablet (10 mg total) by mouth daily. (Patient taking differently: Take 10 mg by mouth 2 (two) times daily. ) 30 tablet 1  . meclizine (ANTIVERT) 12.5 MG tablet Take 12.5 mg by mouth 3 (three) times daily as needed for dizziness.    . Multiple Vitamin (MULTIVITAMIN) tablet Take 1 tablet by mouth every morning.     . Omega-3 1000 MG CAPS Take 1 g by mouth daily.    . Polyvinyl Alcohol-Povidone (REFRESH OP) Apply 1 drop to eye as needed (DRY EYES).     . Triamcinolone Acetonide (NASACORT AQ NA) Place 1 spray into the nose at bedtime as needed (CONGESTION).    Marland Kitchen vitamin E (E-400) 400 UNIT capsule Take 400 Units by mouth every Monday, Wednesday, and Friday. Mon Wed Fri     No current facility-administered medications for this visit.    PHYSICAL EXAMINATION: ECOG PERFORMANCE STATUS: {CHL ONC ECOG PS:[id number]}  There were no vitals filed for this visit. There were no vitals filed for this visit. *** GENERAL:alert, no distress and comfortable SKIN: skin color, texture, turgor are normal, no rashes or significant lesions EYES: normal, Conjunctiva are pink and non-injected, sclera clear {OROPHARYNX:no exudate, no erythema and lips, buccal mucosa, and tongue normal}  NECK: supple, thyroid normal size,  non-tender, without nodularity LYMPH:  no palpable lymphadenopathy in the cervical, axillary {or inguinal} LUNGS: clear to auscultation and percussion with normal breathing effort HEART: regular rate & rhythm and no murmurs and no lower extremity edema ABDOMEN:abdomen soft, non-tender and normal bowel sounds Musculoskeletal:no cyanosis of digits and no clubbing  NEURO: alert & oriented x 3 with fluent speech, no focal motor/sensory deficits  LABORATORY DATA:  I have reviewed the data as listed CBC Latest Ref Rng & Units 10/10/2020 04/18/2020 10/14/2019  WBC 4.0 - 10.5 K/uL  4.5 4.9 4.4  Hemoglobin 12.0 - 15.0 g/dL 12.4 11.7(L) 12.8  Hematocrit 36.0 - 46.0 % 38.1 36.3 39.0  Platelets 150 - 400 K/uL 163 147(L) 163     CMP Latest Ref Rng & Units 10/10/2020 04/18/2020 10/14/2019  Glucose 70 - 99 mg/dL 86 110(H) 104(H)  BUN 8 - 23 mg/dL '16 18 17  ' Creatinine 0.44 - 1.00 mg/dL 0.77 0.73 0.87  Sodium 135 - 145 mmol/L 141 142 143  Potassium 3.5 - 5.1 mmol/L 4.2 4.4 4.7  Chloride 98 - 111 mmol/L 102 106 104  CO2 22 - 32 mmol/L '29 31 29  ' Calcium 8.9 - 10.3 mg/dL 9.4 8.9 9.2  Total Protein 6.5 - 8.1 g/dL 7.2 7.0 7.2  Total Bilirubin 0.3 - 1.2 mg/dL 0.6 0.6 0.5  Alkaline Phos 38 - 126 U/L 91 84 99  AST 15 - 41 U/L '23 19 24  ' ALT 0 - 44 U/L '17 15 21      ' RADIOGRAPHIC STUDIES: I have personally reviewed the radiological images as listed and agreed with the findings in the report. No results found.   ASSESSMENT & PLAN:  Melissa Snyder is a 87 y.o. female with    1. Stage I left lung adenocarcinoma, pT1aN0M0, recurrent LLL lung adenocarcinoma in 06/2017, new lung nodules -initially diagnosed in 11/2014. Herleft lower lobe segmentectomy showed stage I adenocarcinoma -Given the early stage of lung cancer, she does not need adjuvant chemotherapy. -She had cancer recurrence inLLL in1/2019.She was treated with radiation in 07/2017 with Dr. Lisbeth Renshaw. -She does stillhave residual left lateral  ribcage soreness, likely nerve damage from initial surgery. -She was followed by Dr. Roxan Hockey for surveillance, but no more future appointment. -Surveillance CT chest 5/12/21indicates stable/slightly improved lung nodules, nonew nodules. Will continue monitoring***.  -Her CT chest from 10/10/20 showed ***    -I ordered a surveillanceCT chest 06/18/19 which showed 2 left lung nodules have increased in sizeand mild fluid in left lung.We reviewed in thoracic tumor board, and recommend a PET scan -Her PET from2/2/21 showed no evidence of hypermetabolic local tumor recurrence in left lung and no hypermetabolic distant metastasis.Her left lungnodules are too smalland probably below PET scan sensitivity, overall no high suspicion for recurrence.  -I personally read and reviewed her CT chest from 10/14/19 which indicates stable/slihgtly improved lung nodules. There is no new nodules. Will continue monitoring  -She notes she is currently doing well. She notes only having a cough when she first lays down. Labs reviewed form yesterday, CBC and CMP WNL.  -F/u in 6 months with NP Lacie with a repeated CT chest scan before visit.    2. Right breast invasive ductal carcinoma, pT1b N0 M0 stage IA, ER positive PR positive and HER-2 negative. -She was diagnosed in 05/2014. She is s/p right lumpectomy and adjuvant radiation. -I recommended her antiestrogen with Exemestane in 2016 and she declined at that time due to cost and concern with side effects. Will continue with breast cancer surveillance. -She is clinically stable from a breast standpoint. Her 06/2019 Mammogram was normal.There is no clinical concern for recurrence. -Continue Surveillance. Next mammogram set for 06/2020   3. Dull Headacheand unstable balance  -This has been going on for 1 month. She plans to have Brain MRI and see Dr. Salomon Fick next week  -She notes she had HA's before and was found to have benign brain tumor. She has f/u  with Dr Salomon Fick since.  4. Low appetite  -She has lowered appetite but has  been able to maintain weight so far.  -She has been taking protein supplements, I recommend Ensure or boost which has more calories.  5. She'll continue follow-up with her primary care physician and other specialists for her other medical issues. -She is s/p partial hip replacement due to fall on ice in 2018. Ambulates adequately.   Plan: -CT Chest reviewed, Stable lung nodules, will continue monitoring.  - f/u with NP Lacie in 6 months with lab and CT Chest wo contrast a few days before -I will mail her the CT Chest report and date of future appointments.     No problem-specific Assessment & Plan notes found for this encounter.   No orders of the defined types were placed in this encounter.  All questions were answered. The patient knows to call the clinic with any problems, questions or concerns. No barriers to learning was detected. The total time spent in the appointment was {CHL ONC TIME VISIT - SVXBL:[id number]}.     Joslyn Devon 10/12/2020   Oneal Deputy, am acting as scribe for [name], MD.   {Add scribe attestation statement}

## 2020-10-13 ENCOUNTER — Telehealth: Payer: Self-pay | Admitting: Nurse Practitioner

## 2020-10-13 DIAGNOSIS — Z170 Estrogen receptor positive status [ER+]: Secondary | ICD-10-CM

## 2020-10-13 DIAGNOSIS — C50411 Malignant neoplasm of upper-outer quadrant of right female breast: Secondary | ICD-10-CM

## 2020-10-13 DIAGNOSIS — C3432 Malignant neoplasm of lower lobe, left bronchus or lung: Secondary | ICD-10-CM

## 2020-10-13 NOTE — Telephone Encounter (Signed)
I have made several attempts to reach patient on home and mobile #'s to review CT scan. Based on findings, the recommendation is to proceed with PET scan and move f/up after that. Since she has not answered, will leave 5/16 appointment as is. MD aware.   Cira Rue, NP

## 2020-10-14 ENCOUNTER — Telehealth: Payer: Self-pay

## 2020-10-14 DIAGNOSIS — K219 Gastro-esophageal reflux disease without esophagitis: Secondary | ICD-10-CM | POA: Diagnosis not present

## 2020-10-14 DIAGNOSIS — E039 Hypothyroidism, unspecified: Secondary | ICD-10-CM | POA: Diagnosis not present

## 2020-10-14 DIAGNOSIS — C3490 Malignant neoplasm of unspecified part of unspecified bronchus or lung: Secondary | ICD-10-CM | POA: Diagnosis not present

## 2020-10-14 DIAGNOSIS — M1990 Unspecified osteoarthritis, unspecified site: Secondary | ICD-10-CM | POA: Diagnosis not present

## 2020-10-14 DIAGNOSIS — M810 Age-related osteoporosis without current pathological fracture: Secondary | ICD-10-CM | POA: Diagnosis not present

## 2020-10-14 DIAGNOSIS — E785 Hyperlipidemia, unspecified: Secondary | ICD-10-CM | POA: Diagnosis not present

## 2020-10-14 DIAGNOSIS — I10 Essential (primary) hypertension: Secondary | ICD-10-CM | POA: Diagnosis not present

## 2020-10-14 NOTE — Telephone Encounter (Signed)
This nurse attempted to call this patient concerning review of CT results and PET recommendation.  Attempts made times 2 to home phone and message left.  Attempt also made to mobile times one with no answer.

## 2020-10-17 ENCOUNTER — Ambulatory Visit: Payer: Medicare HMO | Admitting: Hematology

## 2020-10-17 ENCOUNTER — Other Ambulatory Visit: Payer: Medicare HMO

## 2020-10-18 ENCOUNTER — Telehealth: Payer: Self-pay | Admitting: Hematology

## 2020-10-18 NOTE — Telephone Encounter (Signed)
Scheduled appts per 5/17 sch msg. Pt aware.

## 2020-10-25 ENCOUNTER — Ambulatory Visit (HOSPITAL_COMMUNITY)
Admission: RE | Admit: 2020-10-25 | Discharge: 2020-10-25 | Disposition: A | Discharge status: Home / Self Care | Payer: Medicare HMO | Source: Ambulatory Visit | Attending: Nurse Practitioner | Admitting: Nurse Practitioner

## 2020-10-25 ENCOUNTER — Other Ambulatory Visit (HOSPITAL_COMMUNITY): Payer: Self-pay | Admitting: *Deleted

## 2020-10-25 ENCOUNTER — Other Ambulatory Visit: Payer: Self-pay

## 2020-10-25 DIAGNOSIS — C3490 Malignant neoplasm of unspecified part of unspecified bronchus or lung: Secondary | ICD-10-CM | POA: Diagnosis not present

## 2020-10-25 DIAGNOSIS — C50411 Malignant neoplasm of upper-outer quadrant of right female breast: Secondary | ICD-10-CM

## 2020-10-25 DIAGNOSIS — Z170 Estrogen receptor positive status [ER+]: Secondary | ICD-10-CM | POA: Insufficient documentation

## 2020-10-25 DIAGNOSIS — H3411 Central retinal artery occlusion, right eye: Secondary | ICD-10-CM | POA: Diagnosis not present

## 2020-10-25 DIAGNOSIS — C3432 Malignant neoplasm of lower lobe, left bronchus or lung: Secondary | ICD-10-CM | POA: Diagnosis not present

## 2020-10-25 DIAGNOSIS — H5201 Hypermetropia, right eye: Secondary | ICD-10-CM | POA: Diagnosis not present

## 2020-10-25 LAB — GLUCOSE, CAPILLARY: Glucose-Capillary: 93 mg/dL (ref 70–99)

## 2020-10-25 MED ORDER — FLUDEOXYGLUCOSE F - 18 (FDG) INJECTION
6.5000 | Freq: Once | INTRAVENOUS | Status: AC | PRN
  Administered 2020-10-25: 6.15 via INTRAVENOUS

## 2020-10-26 ENCOUNTER — Ambulatory Visit (HOSPITAL_COMMUNITY)
Admission: RE | Admit: 2020-10-26 | Discharge: 2020-10-26 | Disposition: A | Discharge status: Home / Self Care | Payer: Medicare HMO | Source: Ambulatory Visit | Attending: Internal Medicine | Admitting: Internal Medicine

## 2020-10-26 DIAGNOSIS — Z170 Estrogen receptor positive status [ER+]: Secondary | ICD-10-CM | POA: Diagnosis not present

## 2020-10-26 DIAGNOSIS — C50411 Malignant neoplasm of upper-outer quadrant of right female breast: Secondary | ICD-10-CM | POA: Diagnosis not present

## 2020-10-26 DIAGNOSIS — C3432 Malignant neoplasm of lower lobe, left bronchus or lung: Secondary | ICD-10-CM | POA: Diagnosis not present

## 2020-10-26 MED ORDER — DENOSUMAB 60 MG/ML ~~LOC~~ SOSY
60.0000 mg | PREFILLED_SYRINGE | Freq: Once | SUBCUTANEOUS | Status: AC
  Administered 2020-10-26: 60 mg via SUBCUTANEOUS

## 2020-10-26 MED ORDER — DENOSUMAB 60 MG/ML ~~LOC~~ SOSY
PREFILLED_SYRINGE | SUBCUTANEOUS | Status: AC
  Filled 2020-10-26: qty 1

## 2020-10-26 NOTE — Progress Notes (Signed)
Melissa Snyder   Telephone:([phone] Fax:([phone]) [phone]   Clinic Follow up Note   Patient Care Team: [name], MD as PCP - General (Internal Medicine) [name], MD as Consulting Physician (Cardiothoracic Surgery) [name], MD as Consulting Physician (Urology) [name], MD as Consulting Physician (Hematology) [name], MD as Consulting Physician (Radiation Oncology) Hayden Pedro, PA-C as Physician Assistant (Radiation Oncology)  Date of Service:  10/27/2020  CHIEF COMPLAINT: Follow up lung nodules, discuss PET scan findings   SUMMARY OF ONCOLOGIC HISTORY: Oncology History Overview Note  Breast cancer (Right breast)   Staging form: Breast, AJCC 7th Edition     Pathologic stage from 10/17/2014: Stage IA (T1b, N0, cM0) - Signed by [name], MD on 10/17/2014 Lung cancer (LLL)   Staging form: Lung, AJCC 7th Edition     Clinical stage from 12/14/2014: Stage IA (T1a, N0, M0) - Unsigned     Breast cancer of upper-outer quadrant of right female breast ([place])  05/04/2014 Initial Diagnosis   Breast cancer   05/19/2014 Imaging   Diagnostic mammogram and ultrasound showed a 7 mm irregular suspicious mass located in the right breast at the 10:00 position, 7 cm from the nipple. Otherwise negative.   05/19/2014 Initial Biopsy   Right breast biopsy (UOQ): IDC, grade 2.    05/19/2014 Receptors her2   ER 100% positive, PR 35% positive, HER-2 negative, Ki67 8%   06/07/2014 Miscellaneous   Genetic testing normal.  APC, ATM, BARD1, BMPR1A, BRCA1, BRCA2, BRIP1, CDH1, CDK4, CDKN2A, CHEK2, EPCAM, GREM1, MLH1, MRE11A, MSH2, MSH6, MUTYH, NBN, NF1, PALB2, PMS2, POLD1, POLE, PTEN, RAD50, RAD51D, SMAD4, SMARCA4, STK11, and TP53 tested.    07/23/2014 Surgery   Right breast lumpectomy with SLNB Lucia Gaskins) showed invasive ductal carcinoma & DCIS; negative surgical margins.   07/23/2014 Pathology Results   Invasive ductal carcinoma, tumor size 0.8  cm, grade 1, no lymphovascular invasion, margins were negative, 2 sentinel lymph nodes were negative. (+) DCIS.   07/23/2014 Pathologic Stage   pT1b, pN0: Stage IA    09/16/2014 - 10/07/2014 Radiation Therapy   XRT completed Pablo Ledger). Right breast/ 42.72 Gy at 2.67 Gy per fraction x 21 fractions.     Anti-estrogen oral therapy   Aromasin prescribed in 01/2015 but Patient declines anti-estrogen therapy at this time d/t high co-pay of medication and possible side effects of AI therapy.    05/02/2015 Mammogram   Diagnostic TOMO bilat mammogram: No evidence of malignancy in either breast. Lumpectomy changes in right breast. Diagnostic mammo suggested in 1 year.    Primary cancer of left lower lobe of lung ([place])  10/25/2013 Imaging   CXR done for stroke protocol. No acute chest findings. Chronic lung disease with biapical scarring. Developing nodule in (L) apex cannot be excluded based on exam, athough may be d/t overlap of osseous structures.    10/27/2013 Imaging   CXR: Nodular opacity in mid (L) apex and appears more prominent than prior study. Area that appeared masslike in LUL near apex on recent CXR not seen at this time.  Underlying emphysema present. No edema or consolidation   11/12/2013 Imaging   CT chest: At left lung apex, there is asymmetric pleural parenchymal scarring. No discrete lesion.  In superior segment of LLL, there are 1 dominant ground-glass opacity measuring 16 mm. 2 addt'l ground-glass opacities appreciated. F/U with CT in 3 months   02/11/2014 Imaging   CT chest: Several small bilat faint nodular densities, largest measures 1.3 cm over  superior segment LLL. No new nodules, no adenopathy. Cannot exclude metastatic disease given pt's h/o melanoma. Recommend PET-CT   02/23/2014 PET scan   Persistent semi-solid nodule in superior segment LLL with low metabolic activity. Moderately metabolic subcarinal & hilar LN are likely reactive.    05/25/2014 Imaging   CT chest:  Persistent & similar size dominant LLL sub-solid nodule. Adenocarcinoma is considered. Biopsy or resection strongly considered. Additional scattered small ground-glass nodules stable.    06/02/2014 Imaging   MRI brain: Grossly stable appearance of left planum sphenoidale meningioma measuring 20 x 14 mm. Meningioma does not extend to left orbital apex & contacts left optic nerve. Stable right posterior clinoid meningioma .    09/27/2014 Imaging   CT chest: Further enlargment of central solid component in sub-solid LLL pulm nodule. Findings remain concerning for adenoca. Additional scattered small bilat ground-glass nodules and biapical scarring stable. No adenopathy.     11/25/2014 Surgery   Video bronchoscopy with endobronchial ultrasound; Video assisted thoracoscopy; Thoracoscopic LLL superior segmentectomy; Mediastinal lymph node dissection; Cyro intercostal nerve block Roxan Hockey)   11/25/2014 Pathology Results   Superior segment lung resection (LLL). Adenocarcinoma, well-diff, spanning 1.5 cm. Surgical margins negative. 5 LNs negative.    11/25/2014 Pathologic Stage   pT1a, pN0: Stage IA    11/25/2014 Initial Diagnosis   Primary cancer of left lower lobe of lung ([place])   05/31/2015 Imaging   CT chest: No evidence of local recurrent at superior segmentectomy in LLL. New small nodular focus of consolidation surrounding thin parenchymal band. Additional sub-cm ground-glass and solid nodules favored to be benign. No thoracic adenopathy   08/11/2015 Imaging   CT chest: No evidence of residual or recurrent tumor in LLL. Stable small nodular focus in LLL with thin surrounding parenchymal band, favored to be benign.  Multiple small ground-glass nodules unchanged.    06/19/2017 Pathology Results   Diagnosis 1. Lung, biopsy, Left lower lobe - ADENOCARCINOMA. - SEE COMMENT. 2. Lung, biopsy, Left lower lobe - BENIGN LUNG PARENCHYMA. - THERE IS NO EVIDENCE OF MALIGNANCY.   07/09/2017 - 07/22/2017  Radiation Therapy   Radiation treatment dates: 07/09/17-07/22/17 by Dr. Lisbeth Renshaw    Site/dose:  Left lung/ 50 Gy in 10 fractions   Beams/energy:  IMRT/ 6X   Narrative: The patient tolerated radiation treatment relatively well. She denied pain, fatigue, or difficulty swallowing. Skin to the treatment area appears warm, dry, and normal in color.   06/18/2019 Imaging   CT Chest  IMPRESSION: 1. There are 2 round solid nodules within the left lower lobe which have increased in size when compared with the previous exam, suspicious. Additionally, along the area of postsurgical change around the oblique fissure of the left lung there is progressive thickening with persistent loculated fluid. Cannot rule out local tumor recurrence along the suture line. 2. No significant change in the appearance of bilateral upper lobe sub solid lung nodules. 3. Aortic Atherosclerosis (ICD10-I70.0). Coronary artery calcifications.     07/07/2019 PET scan   IMPRESSION: 1. No evidence of hypermetabolic local tumor recurrence in the left lung. Low level nonfocal uptake along the segmentectomy site in the left lower lobe favors post treatment change. Continued chest CT surveillance warranted. 2. Two small solid left lower lobe pulmonary nodules, largest 0.7 cm, below PET resolution. Given the growth of these nodules since 10/03/2018 chest CT, metastatic disease remains on the differential. Close chest CT surveillance recommended. 3. Nonspecific hypermetabolism within nonenlarged subcarinal and bilateral hilar lymph nodes, not substantially changed since  03/04/2017 PET-CT study, favoring reactive uptake. 4. No hypermetabolic distant metastatic disease. 5. Chronic findings include: Aortic Atherosclerosis (ICD10-I70.0). Subcentimeter ground-glass pulmonary nodules in the upper lobes are unchanged and are below PET resolution. Moderate sigmoid diverticulosis. Chronic right maxillary sinusitis.   04/13/2020  Imaging   CT Chest  IMPRESSION: 1. Primarily similar appearance of the lungs compared to 10/14/2019. Left sided surgical sutures with similar solid and subsolid pulmonary nodules as detailed above. A right middle lobe pleural based 5 mm nodule is new since the prior exam, warranting follow-up attention. 2. No thoracic adenopathy. 3. Aortic atherosclerosis (ICD10-I70.0), coronary artery atherosclerosis and emphysema (ICD10-J43.9).   10/10/2020 Imaging   CT chest  IMPRESSION: Postsurgical and post radiation changes in the lungs bilaterally, as above.   12 x 10 mm left lower lobe nodule, mildly progressive. Additional bilobed nodule in the posterior left lower lobe, also favored to be progressive. PET-CT is suggested for further evaluation.   Aortic Atherosclerosis (ICD10-I70.0) and Emphysema (ICD10-J43.9).      CURRENT THERAPY:  Surveillance  INTERVAL HISTORY:  Melissa Snyder is here for a follow up of her recent scan fidnings. She was last seen by me 1 year ago and seen by NP Lacie 6 months ago in interim. She presents to the clinic alone.  She is clinically doing well, has occasional dry cough, no chest pain or dyspnea.  No other new painful complaints.  No recent weight loss.  All other systems were reviewed with the patient and are negative.  MEDICAL HISTORY:  Past Medical History:  Diagnosis Date  . Adenocarcinoma ([place])    recurrent/notes 07/03/2017  . Blind left eye 10/2013   cva  . Breast cancer ([place]) 05/2014   ER+/PR+/Her2-     LUNG CANCER  . Bundle branch block left   . Chest pain   . Family history of bladder cancer   . Family history of breast cancer   . Family history of prostate cancer   . GERD (gastroesophageal reflux disease)   . History of blood transfusion    age 21  . Hyperlipidemia   . Hypertension   . Hypothyroid   . Lung nodule   . Melanoma in situ of face ([place]) JUNE 2015   LEFT CHEEK  . Meningioma ([place])    brain lining  . Migraine     reports only having one  . Osteoarthritis   . Personal history of radiation therapy   . Radiation 09/16/14-10/07/14   Right  Breast  21 fractions  . Skin cancer   . Stroke [facility]) 2015   Blind in left eye as a result    SURGICAL HISTORY: Past Surgical History:  Procedure Laterality Date  . ABDOMINAL HYSTERECTOMY  ~ 1997  . APPENDECTOMY     age 38  . BILATERAL SALPINGOOPHORECTOMY    . BLADDER REPAIR  2009   cystocele  . BRAIN MENINGIOMA EXCISION     Gamma knife to Meningioma in brain lining  . BREAST BIOPSY    . BREAST LUMPECTOMY Right 07/23/2014   invasive ductal  . BUNIONECTOMY     bilateral great toe  . CARDIAC CATHETERIZATION  2004  . CHOLECYSTECTOMY  ~ 1997  . COLONOSCOPY    . CRYO INTERCOSTAL NERVE BLOCK Left 11/25/2014   Procedure: CRYO INTERCOSTAL NERVE BLOCK;  Surgeon: [name], MD;  Location: [place];  Service: Thoracic;  Laterality: Left;  . CT RADIATION THERAPY GUIDE     Gamma radiation -lt frontal-[facility]  . DILATION  AND CURETTAGE OF UTERUS     X 2  . HIP ARTHROPLASTY Left 07/04/2017   Procedure: ARTHROPLASTY BIPOLAR HIP (HEMIARTHROPLASTY);  Surgeon: Tania Ade, MD;  Location: [place];  Service: Orthopedics;  Laterality: Left;  Marland Kitchen MELANOMA EXCISION Left 11/05/23, 11/30/13   CHEEK  . SEGMENTECOMY Left 11/25/2014   Procedure: LEFT LOWER LOBE SEGMENTECTOMY;  Surgeon: [name], MD;  Location: [place];  Service: Thoracic;  Laterality: Left;  Marland Kitchen VIDEO ASSISTED THORACOSCOPY Left 11/25/2014   Procedure: VIDEO ASSISTED THORACOSCOPY;  Surgeon: [name], MD;  Location: [place] [facility];  Service: Thoracic;  Laterality: Left;  Marland Kitchen VIDEO BRONCHOSCOPY WITH ENDOBRONCHIAL NAVIGATION N/A 06/19/2017   Procedure: VIDEO BRONCHOSCOPY;  Surgeon: [name], MD;  Location: [place];  Service: Thoracic;  Laterality: N/A;  . VIDEO BRONCHOSCOPY WITH ENDOBRONCHIAL ULTRASOUND N/A 11/25/2014   Procedure: VIDEO BRONCHOSCOPY WITH ENDOBRONCHIAL ULTRASOUND;  Surgeon: [name], MD;  Location: [place];  Service: Thoracic;  Laterality: N/A;    I have reviewed the social history and family history with the patient and they are unchanged from previous note.  ALLERGIES:  is allergic to beef-derived products, other, chocolate, ibuprofen, niacin and related, and tramadol.  MEDICATIONS:  Current Outpatient Medications  Medication Sig Dispense Refill  . atenolol (TENORMIN) 50 MG tablet Take 50 mg by mouth at bedtime.     Marland Kitchen b complex vitamins capsule Take 1 capsule by mouth daily.    . calcium-vitamin D (OSCAL WITH D) 500-200 MG-UNIT per tablet Take 1 tablet by mouth at bedtime.     . Cholecalciferol (VITAMIN D3) 1000 units CAPS Take by mouth daily.    . Digestive Enzymes (ENZYME DIGEST PO) Take 5 mLs by mouth 3 (three) times daily with meals.    . fish oil-omega-3 fatty acids 1000 MG capsule Take 1 g by mouth daily.     Marland Kitchen ketoconazole (NIZORAL) 2 % cream Apply 1 application topically 2 (two) times daily. 15 g 2  . levothyroxine (SYNTHROID, LEVOTHROID) 112 MCG tablet Take 112 mcg by mouth daily before breakfast.     . lisinopril (PRINIVIL,ZESTRIL) 10 MG tablet Take 1 tablet (10 mg total) by mouth daily. (Patient taking differently: Take 10 mg by mouth 2 (two) times daily. ) 30 tablet 1  . meclizine (ANTIVERT) 12.5 MG tablet Take 12.5 mg by mouth 3 (three) times daily as needed for dizziness.    . Multiple Vitamin (MULTIVITAMIN) tablet Take 1 tablet by mouth every morning.     . Omega-3 1000 MG CAPS Take 1 g by mouth daily.    . Polyvinyl Alcohol-Povidone (REFRESH OP) Apply 1 drop to eye as needed (DRY EYES).     . Triamcinolone Acetonide (NASACORT AQ NA) Place 1 spray into the nose at bedtime as needed (CONGESTION).    Marland Kitchen vitamin E (E-400) 400 UNIT capsule Take 400 Units by mouth every Monday, Wednesday, and Friday. Mon Wed Fri     No current facility-administered medications for this visit.    PHYSICAL EXAMINATION: ECOG PERFORMANCE STATUS: 0 -  Asymptomatic  Vitals:   10/27/20 0945  BP: (!) 144/71  Pulse: 70  Resp: 15  Temp: 98 F (36.7 C)  SpO2: 100%   Filed Weights   10/27/20 0945  Weight: 123 lb 3.2 oz (55.9 kg)    GENERAL:alert, no distress and comfortable SKIN: skin color, texture, turgor are normal, no rashes or significant lesions EYES: normal, Conjunctiva are pink and non-injected, sclera clear NECK: supple, thyroid normal size,  non-tender, without nodularity LYMPH:  no palpable lymphadenopathy in the cervical, axillary  LUNGS: clear to auscultation and percussion with normal breathing effort HEART: regular rate & rhythm and no murmurs and no lower extremity edema ABDOMEN:abdomen soft, non-tender and normal bowel sounds Musculoskeletal:no cyanosis of digits and no clubbing  NEURO: alert & oriented x 3 with fluent speech, no focal motor/sensory deficits  LABORATORY DATA:  I have reviewed the data as listed CBC Latest Ref Rng & Units 10/27/2020 10/10/2020 04/18/2020  WBC 4.0 - 10.5 K/uL 4.2 4.5 4.9  Hemoglobin 12.0 - 15.0 g/dL 12.0 12.4 11.7(L)  Hematocrit 36.0 - 46.0 % 36.7 38.1 36.3  Platelets 150 - 400 K/uL 148(L) 163 147(L)     CMP Latest Ref Rng & Units 10/27/2020 10/10/2020 04/18/2020  Glucose 70 - 99 mg/dL 101(H) 86 110(H)  BUN 8 - 23 mg/dL '18 16 18  ' Creatinine 0.44 - 1.00 mg/dL 0.76 0.77 0.73  Sodium 135 - 145 mmol/L 143 141 142  Potassium 3.5 - 5.1 mmol/L 3.8 4.2 4.4  Chloride 98 - 111 mmol/L 104 102 106  CO2 22 - 32 mmol/L '29 29 31  ' Calcium 8.9 - 10.3 mg/dL 9.3 9.4 8.9  Total Protein 6.5 - 8.1 g/dL 6.9 7.2 7.0  Total Bilirubin 0.3 - 1.2 mg/dL 0.7 0.6 0.6  Alkaline Phos 38 - 126 U/L 89 91 84  AST 15 - 41 U/L '22 23 19  ' ALT 0 - 44 U/L '18 17 15      ' RADIOGRAPHIC STUDIES: I have personally reviewed the radiological images as listed and agreed with the findings in the report. No results found.   ASSESSMENT & PLAN:  STELLAR GENSEL is a 87 y.o. female with   1. Stage I left lung  adenocarcinoma, pT1aN0M0 in 2016, recurrent LLL lung adenocarcinoma in 06/2017, new lung nodules -initially diagnosed in 11/2014. Herleft lower lobe segmentectomy showed stage I adenocarcinoma -Given the early stage of lung cancer, she does not need adjuvant chemotherapy. -She had cancer recurrence inLLL in1/2019.She was treated with radiation in 07/2017 with Dr. Lisbeth Renshaw. -She does stillhave residual left lateral ribcage soreness, likely nerve damage from initial surgery. -She was followed by Dr. Roxan Hockey for surveillance, but no more future appointment. -She was seen to have 2 left lung nodules on 06/18/19 CT Chest  -I personally reviewed her recent CT chest from 10/10/20 and PET from 10/27/20 images with patient and discussed the findings.  There are 2 enlarging nodules in the left upper lobe, measuring 1.2 and 1.3 cm, with SUV 1.3-2.45, slightly increased from PET scan in February of 2021.  These are concerning for malignancy.  Nodules in the right lung and lymph nodes in mediastinum and right hilar are stable. -I will refer her back to Dr. Roxan Hockey, and discuss her case in our thoracic tumor board next week, to see if we can biopsy.  -I discussed the option of surgical resection (lobectomy) vs SBRT if they are confirmed malignancy  -if she does go EBUS biopsy, I would recommend biopsy of her mediastinal or right hilar lymph nodes if feasible, for staging and treatment planning. These two nodes are hypermetabolic on PET scan, although they have not changed since February 2021.   2. Right breast invasive ductal carcinoma, pT1b N0 M0 stage IA, ER positive PR positive and HER-2 negative. -She was diagnosed in 05/2014. She is s/p right lumpectomy and adjuvant radiation. -I recommended her antiestrogen with Exemestane in 2016 and she declined at that time due to cost  and concern with side effects. Will continue with breast cancer surveillance. -She is clinically stable from a breast  standpoint. Her 06/2019 Mammogram was normal.There is no clinical concern for recurrence. -Continue Surveillance. Her 08/2020 Mammogram showed benign left breast calcifications.   3. Dull Headacheand unstable balance  -This has been going on for 1 month. She plans to have Brain MRI and see Dr. Salomon Fick next week  -She notes she had HA's before and was found to have benign brain tumor. She has f/u with Dr Salomon Fick since.  4. Low appetite  -She has lowered appetite but has been able to maintain weight so far.  -She has been taking protein supplements, I recommend Ensure or boost which has more calories.  5. She'll continue follow-up with her primary care physician and other specialists for her other medical issues. -She is s/p partial hip replacement due to fall on ice in 2018. Ambulates adequately.   Plan: -CT and PET scan reviewed in person, and I have given her a copy of the reports -Thoracic conference discussion next week -We will refer her back to Dr. Roxan Hockey -Lab and follow-up in 6 months, sooner if needed.   No problem-specific Assessment & Plan notes found for this encounter.   No orders of the defined types were placed in this encounter.  All questions were answered. The patient knows to call the clinic with any problems, questions or concerns. No barriers to learning was detected. The total time spent in the appointment was 30 minutes.     [name], MD 10/27/2020   I, Melissa Snyder, am acting as scribe for [name], MD.   I have reviewed the above documentation for accuracy and completeness, and I agree with the above.

## 2020-10-27 ENCOUNTER — Encounter: Payer: Self-pay | Admitting: Hematology

## 2020-10-27 ENCOUNTER — Inpatient Hospital Stay: Payer: Medicare HMO | Admitting: Hematology

## 2020-10-27 ENCOUNTER — Other Ambulatory Visit: Payer: Self-pay

## 2020-10-27 ENCOUNTER — Inpatient Hospital Stay: Payer: Medicare HMO

## 2020-10-27 VITALS — BP 144/71 | HR 70 | Temp 98.0°F | Resp 15 | Ht 65.25 in | Wt 123.2 lb

## 2020-10-27 DIAGNOSIS — C50411 Malignant neoplasm of upper-outer quadrant of right female breast: Secondary | ICD-10-CM

## 2020-10-27 DIAGNOSIS — Z85118 Personal history of other malignant neoplasm of bronchus and lung: Secondary | ICD-10-CM | POA: Diagnosis not present

## 2020-10-27 DIAGNOSIS — C3432 Malignant neoplasm of lower lobe, left bronchus or lung: Secondary | ICD-10-CM

## 2020-10-27 DIAGNOSIS — Z170 Estrogen receptor positive status [ER+]: Secondary | ICD-10-CM | POA: Diagnosis not present

## 2020-10-27 DIAGNOSIS — Z853 Personal history of malignant neoplasm of breast: Secondary | ICD-10-CM | POA: Diagnosis not present

## 2020-10-27 DIAGNOSIS — Z923 Personal history of irradiation: Secondary | ICD-10-CM | POA: Diagnosis not present

## 2020-10-27 LAB — CBC WITH DIFFERENTIAL (CANCER CENTER ONLY)
Abs Immature Granulocytes: 0.01 10*3/uL (ref 0.00–0.07)
Basophils Absolute: 0 10*3/uL (ref 0.0–0.1)
Basophils Relative: 1 %
Eosinophils Absolute: 0.2 10*3/uL (ref 0.0–0.5)
Eosinophils Relative: 4 %
HCT: 36.7 % (ref 36.0–46.0)
Hemoglobin: 12 g/dL (ref 12.0–15.0)
Immature Granulocytes: 0 %
Lymphocytes Relative: 15 %
Lymphs Abs: 0.6 10*3/uL — ABNORMAL LOW (ref 0.7–4.0)
MCH: 29.6 pg (ref 26.0–34.0)
MCHC: 32.7 g/dL (ref 30.0–36.0)
MCV: 90.4 fL (ref 80.0–100.0)
Monocytes Absolute: 0.4 10*3/uL (ref 0.1–1.0)
Monocytes Relative: 9 %
Neutro Abs: 3 10*3/uL (ref 1.7–7.7)
Neutrophils Relative %: 71 %
Platelet Count: 148 10*3/uL — ABNORMAL LOW (ref 150–400)
RBC: 4.06 MIL/uL (ref 3.87–5.11)
RDW: 13.5 % (ref 11.5–15.5)
WBC Count: 4.2 10*3/uL (ref 4.0–10.5)
nRBC: 0 % (ref 0.0–0.2)

## 2020-10-27 LAB — CMP (CANCER CENTER ONLY)
ALT: 18 U/L (ref 0–44)
AST: 22 U/L (ref 15–41)
Albumin: 3.9 g/dL (ref 3.5–5.0)
Alkaline Phosphatase: 89 U/L (ref 38–126)
Anion gap: 10 (ref 5–15)
BUN: 18 mg/dL (ref 8–23)
CO2: 29 mmol/L (ref 22–32)
Calcium: 9.3 mg/dL (ref 8.9–10.3)
Chloride: 104 mmol/L (ref 98–111)
Creatinine: 0.76 mg/dL (ref 0.44–1.00)
GFR, Estimated: >60 mL/min (ref >60)
Glucose, Bld: 101 mg/dL — ABNORMAL HIGH (ref 70–99)
Potassium: 3.8 mmol/L (ref 3.5–5.1)
Sodium: 143 mmol/L (ref 135–145)
Total Bilirubin: 0.7 mg/dL (ref 0.3–1.2)
Total Protein: 6.9 g/dL (ref 6.5–8.1)

## 2020-11-01 ENCOUNTER — Telehealth: Payer: Self-pay | Admitting: Hematology

## 2020-11-01 DIAGNOSIS — M1990 Unspecified osteoarthritis, unspecified site: Secondary | ICD-10-CM | POA: Diagnosis not present

## 2020-11-01 DIAGNOSIS — I10 Essential (primary) hypertension: Secondary | ICD-10-CM | POA: Diagnosis not present

## 2020-11-01 DIAGNOSIS — M810 Age-related osteoporosis without current pathological fracture: Secondary | ICD-10-CM | POA: Diagnosis not present

## 2020-11-01 DIAGNOSIS — E785 Hyperlipidemia, unspecified: Secondary | ICD-10-CM | POA: Diagnosis not present

## 2020-11-01 NOTE — Telephone Encounter (Signed)
Scheduled per los. Called and left msg. Mailed printout  °

## 2020-11-03 ENCOUNTER — Encounter: Payer: Self-pay | Admitting: *Deleted

## 2020-11-03 ENCOUNTER — Other Ambulatory Visit: Payer: Self-pay | Admitting: *Deleted

## 2020-11-03 ENCOUNTER — Telehealth: Payer: Self-pay | Admitting: Hematology

## 2020-11-03 NOTE — Progress Notes (Signed)
I followed up with Dr. Burr Medico regarding cancer conference discussion. She requested Ms. Bowermaster's tissue from 2019 be sent for molecular testing and PDL 1 to [facility]. I updated pathology dept of request.

## 2020-11-03 NOTE — Telephone Encounter (Signed)
I presented her case in thoracic tumor conference this morning, both left lower lobe nodules are concerning for malignancy, as a new primary or recurrence from previous lung cancer.  Dr. Roxan Hockey does not think she is a good candidate for surgery. Tumor board recommended SBRT treatment, and we do not feel strongly that we need biopsy these nodules given the scan finding, and her advanced age.  I called patient after tumor board, she is agreeable with the plan.  I will refer her back to Dr. Lisbeth Renshaw.  I will also request [facility] on her previous lung cancer  biopsy from 2019.  [name]  11/03/2020

## 2020-11-03 NOTE — Progress Notes (Signed)
The proposed treatment discussed in cancer conference is for discussion purpose only and is not a binding recommendation. The patient was not physically examined nor present for their treatment options. Therefore, final treatment plans cannot be decided.  ?

## 2020-11-09 ENCOUNTER — Ambulatory Visit
Admission: RE | Admit: 2020-11-09 | Discharge: 2020-11-09 | Disposition: A | Discharge status: Home / Self Care | Payer: Medicare HMO | Source: Ambulatory Visit | Attending: Radiation Oncology | Admitting: Radiation Oncology

## 2020-11-09 ENCOUNTER — Encounter: Payer: Self-pay | Admitting: *Deleted

## 2020-11-09 ENCOUNTER — Other Ambulatory Visit: Payer: Self-pay

## 2020-11-09 VITALS — Ht 65.25 in | Wt 124.0 lb

## 2020-11-09 DIAGNOSIS — C3432 Malignant neoplasm of lower lobe, left bronchus or lung: Secondary | ICD-10-CM

## 2020-11-09 DIAGNOSIS — Z923 Personal history of irradiation: Secondary | ICD-10-CM | POA: Diagnosis not present

## 2020-11-09 NOTE — Progress Notes (Signed)
New Cancer Diagnosis: Bilateral Left Lower Lobe Nodules  Dr. Roxan Hockey does not thin she is a surgical candidate.  Tumor board recommended SBRT treatment, and we do not feel strongly that we need biopsy these nodules given the scan finding, and her advanced age.   CT Chest 10/10/2020: Postsurgical and post radiation changes in the lungs bilaterally, as above.  12 x 10 mm left lower lobe nodule, mildly progressive. Additional bilobed nodule in the posterior left lower lobe, also favored to be progressive. PET-CT is suggested for further evaluation.   SAFETY ISSUES: Prior radiation? Right Breast 42.72 Gy at 2.67 Gy per fraction x21 fractions, Left Lung 50 Gy in 10 fractions 2/5-2/18/2019 Pacemaker/ICD? No Possible current pregnancy? Hysterectomy Is the patient on methotrexate? No  Current Complaints / other details:

## 2020-11-09 NOTE — Progress Notes (Signed)
Radiation Oncology         [phone] ________________________________  Outpatient ReConsultation - Conducted via telephone due to current COVID-19 concerns for limiting patient exposure  I spoke with the patient to conduct this consult visit via telephone to spare the patient unnecessary potential exposure in the healthcare setting during the current COVID-19 pandemic. The patient was notified in advance and was offered a [place] meeting to allow for face to face communication but unfortunately reported that they did not have the appropriate resources/technology to support such a visit and instead preferred to proceed with a telephone consult.     Name: Melissa Snyder        MRN: [id number]  Date of Service: 11/09/2020 DOB: January 12, 1934  PT:[id number], Elta Guadeloupe, MD  [name], MD     REFERRING PHYSICIAN: [name], MD   DIAGNOSIS: The encounter diagnosis was Primary cancer of left lower lobe of lung ([place]).   HISTORY OF PRESENT ILLNESS: Melissa Snyder is a 87 y.o. female with a history of multifocal stage IA, NSCLC, adenocarcinoma of the lung.  She underwent a left lower lobe superior segmentectomy in June 2016. She had negative margins and the tumor was 5 mm away from the suture line. She also had completed a course of radiotherapy following lumpectomy for Stage IA, pT1bN0, grade 1 ER/PR positive invasive ductal carcinoma of the right breast.  She was treated for putative stage I disease in a different section of the left lower lobe in 2019 after a 1.8 cm nodule had been identified and showed hypermetabolism on PET scan.  She has been followed since in surveillance, given the changes in who she was being seen by in general surgery for her breast cancer, she has established all of her oncologic care with Dr. Burr Medico. Recent CT scan of the chest on 10/10/2020 showed mild progression in the left lower lobe nodule measuring up to 12 mm and an additional bilobed nodule in the posterior left lower lobe also favored  to be progressive.  PET imaging on 10/25/2020 showed mild FDG uptake in the enlarging nodules in the posterior lateral left lower lobe with an SUV of 1.3, this activity in size has increased from her prior scan, there was also a nodule in the right upper lobe that was below PET affinity. She met with Dr. Roxan Hockey and it is recommended that she consider radiotherapy rather than surgical resection given her age and comorbidities.  Dr. Roxan Hockey feels that the volume of lung that would need to be removed would be great and change her quality of life in addition to the risk of still having disease left behind.  She is contacted today by telephone to discuss options of stereotactic body radiotherapy (SBRT).    PREVIOUS RADIATION THERAPY:   07/09/17-07/22/17 SBRT  The LLL target was treated to50 Gy in 10 fractions   09/16/2014 to 10/07/2014: The site and dose include the right breast at 42.72 Gy at 2.67 Gy per fraction x 21 fractions  PAST MEDICAL HISTORY:  Past Medical History:  Diagnosis Date  . Adenocarcinoma ([place])    recurrent/notes 07/03/2017  . Blind left eye 10/2013   cva  . Breast cancer ([place]) 05/2014   ER+/PR+/Her2-     LUNG CANCER  . Bundle branch block left   . Chest pain   . Family history of bladder cancer   . Family history of breast cancer   . Family history of prostate cancer   . GERD (gastroesophageal reflux disease)   .  History of blood transfusion    age 16  . Hyperlipidemia   . Hypertension   . Hypothyroid   . Lung nodule   . Melanoma in situ of face ([place]) JUNE 2015   LEFT CHEEK  . Meningioma ([place])    brain lining  . Migraine    reports only having one  . Osteoarthritis   . Personal history of radiation therapy   . Radiation 09/16/14-10/07/14   Right  Breast  21 fractions  . Skin cancer   . Stroke [facility]) 2015   Blind in left eye as a result       PAST SURGICAL HISTORY: Past Surgical History:  Procedure Laterality Date  . ABDOMINAL HYSTERECTOMY  ~ 1997   . APPENDECTOMY     age 38  . BILATERAL SALPINGOOPHORECTOMY    . BLADDER REPAIR  2009   cystocele  . BRAIN MENINGIOMA EXCISION     Gamma knife to Meningioma in brain lining  . BREAST BIOPSY    . BREAST LUMPECTOMY Right 07/23/2014   invasive ductal  . BUNIONECTOMY     bilateral great toe  . CARDIAC CATHETERIZATION  2004  . CHOLECYSTECTOMY  ~ 1997  . COLONOSCOPY    . CRYO INTERCOSTAL NERVE BLOCK Left 11/25/2014   Procedure: CRYO INTERCOSTAL NERVE BLOCK;  Surgeon: [name], MD;  Location: [facility];  Service: Thoracic;  Laterality: Left;  . CT RADIATION THERAPY GUIDE     Gamma radiation -lt frontal-[facility]  . DILATION AND CURETTAGE OF UTERUS     X 2  . HIP ARTHROPLASTY Left 07/04/2017   Procedure: ARTHROPLASTY BIPOLAR HIP (HEMIARTHROPLASTY);  Surgeon: Tania Ade, MD;  Location: [facility];  Service: Orthopedics;  Laterality: Left;  Marland Kitchen MELANOMA EXCISION Left 11/05/23, 11/30/13   CHEEK  . SEGMENTECOMY Left 11/25/2014   Procedure: LEFT LOWER LOBE SEGMENTECTOMY;  Surgeon: [name], MD;  Location: [facility];  Service: Thoracic;  Laterality: Left;  Marland Kitchen VIDEO ASSISTED THORACOSCOPY Left 11/25/2014   Procedure: VIDEO ASSISTED THORACOSCOPY;  Surgeon: [name], MD;  Location: [facility];  Service: Thoracic;  Laterality: Left;  Marland Kitchen VIDEO BRONCHOSCOPY WITH ENDOBRONCHIAL NAVIGATION N/A 06/19/2017   Procedure: VIDEO BRONCHOSCOPY;  Surgeon: [name], MD;  Location: [place];  Service: Thoracic;  Laterality: N/A;  . VIDEO BRONCHOSCOPY WITH ENDOBRONCHIAL ULTRASOUND N/A 11/25/2014   Procedure: VIDEO BRONCHOSCOPY WITH ENDOBRONCHIAL ULTRASOUND;  Surgeon: [name], MD;  Location: [facility];  Service: Thoracic;  Laterality: N/A;     FAMILY HISTORY:  Family History  Problem Relation Age of Onset  . CAD Mother        CABG  . Hyperlipidemia Mother   . AAA (abdominal aortic aneurysm) Mother   . Renal Disease Father   . Kidney disease Father   . CAD Father   . Cancer  Father        PROSTATE  . Diabetes Brother   . Hyperlipidemia Brother   . Breast cancer Daughter 91  . Breast cancer Sister 37  . Breast cancer Sister 73  . Cancer Brother        NOS  . Bladder Cancer Brother 6  . Lung cancer Cousin        3 paternal cousins with lung cancer  . Breast cancer Cousin        five paternal first cousins  . Cancer Cousin        4 paternal first cousins with Cancer NOS  . Cancer Cousin  1 paternal cousin with oral cancer (tongue)     SOCIAL HISTORY:  reports that she has never smoked. She has never used smokeless tobacco. She reports that she does not drink alcohol and does not use drugs. The patient is married and lives in [place].   ALLERGIES: Beef-derived products, Other, Chocolate, Ibuprofen, Niacin and related, and Tramadol   MEDICATIONS:  Current Outpatient Medications  Medication Sig Dispense Refill  . aspirin 81 MG EC tablet     . atenolol (TENORMIN) 50 MG tablet Take 50 mg by mouth at bedtime.     Marland Kitchen b complex vitamins capsule Take 1 capsule by mouth daily.    . calcium carbonate (OSCAL) 1500 (600 Ca) MG TABS tablet     . calcium-vitamin D (OSCAL WITH D) 500-200 MG-UNIT per tablet Take 1 tablet by mouth at bedtime.     . chlorthalidone (HYGROTON) 25 MG tablet Take 25 mg by mouth 3 (three) times a week.    . Cholecalciferol (VITAMIN D3) 1000 units CAPS Take by mouth daily.    Marland Kitchen denosumab (PROLIA) 60 MG/ML SOSY injection     . Digestive Enzymes (ENZYME DIGEST PO) Take 5 mLs by mouth 3 (three) times daily with meals.    . fish oil-omega-3 fatty acids 1000 MG capsule Take 1 g by mouth daily.     Marland Kitchen ketoconazole (NIZORAL) 2 % cream Apply 1 application topically 2 (two) times daily. 15 g 2  . levothyroxine (SYNTHROID, LEVOTHROID) 112 MCG tablet Take 112 mcg by mouth daily before breakfast.     . lisinopril (PRINIVIL,ZESTRIL) 10 MG tablet Take 1 tablet (10 mg total) by mouth daily. (Patient taking differently: Take 10 mg by mouth 2 (two)  times daily.) 30 tablet 1  . meclizine (ANTIVERT) 12.5 MG tablet Take 12.5 mg by mouth 3 (three) times daily as needed for dizziness.    . Multiple Vitamin (MULTIVITAMIN) tablet Take 1 tablet by mouth every morning.     . Omega-3 1000 MG CAPS Take 1 g by mouth daily.    . Polyvinyl Alcohol-Povidone (REFRESH OP) Apply 1 drop to eye as needed (DRY EYES).     . Triamcinolone Acetonide (NASACORT AQ NA) Place 1 spray into the nose at bedtime as needed (CONGESTION).    Marland Kitchen vitamin E 180 MG (400 UNITS) capsule Take 400 Units by mouth every Monday, Wednesday, and Friday. Mon Wed Fri     No current facility-administered medications for this encounter.     REVIEW OF SYSTEMS: On review of systems, the patient reports she is doing very well.  She has had chronic soreness in her left chest since her surgery, otherwise she has not had any progressive difficulty with breathing, cough or hemoptysis.  She feels that her oral intake is stable and weight is as well.  No other complaints are verbalized.  PHYSICAL EXAM:  Unable to assess given encounter type.  ECOG = 0  0 - Asymptomatic (Fully active, able to carry on all predisease activities without restriction)  1 - Symptomatic but completely ambulatory (Restricted in physically strenuous activity but ambulatory and able to carry out work of a light or sedentary nature. For example, light housework, office work)  2 - Symptomatic, <50% in bed during the day (Ambulatory and capable of all self care but unable to carry out any work activities. Up and about more than 50% of waking hours)  3 - Symptomatic, >50% in bed, but not bedbound (Capable of only limited self-care, confined to bed  or chair 50% or more of waking hours)  4 - Bedbound (Completely disabled. Cannot carry on any self-care. Totally confined to bed or chair)  5 - Death   Eustace Pen MM, Creech RH, Tormey DC, et al. [phone]). "Toxicity and response criteria of the [facility]". [place] Oncol. 5 (6): 649-55    LABORATORY DATA:  Lab Results  Component Value Date   WBC 4.2 10/27/2020   HGB 12.0 10/27/2020   HCT 36.7 10/27/2020   MCV 90.4 10/27/2020   PLT 148 (L) 10/27/2020   Lab Results  Component Value Date   NA 143 10/27/2020   K 3.8 10/27/2020   CL 104 10/27/2020   CO2 29 10/27/2020   Lab Results  Component Value Date   ALT 18 10/27/2020   AST 22 10/27/2020   ALKPHOS 89 10/27/2020   BILITOT 0.7 10/27/2020      RADIOGRAPHY: NM PET Image Restag (PS) Skull Base To Thigh  Result Date: 10/26/2020 CLINICAL DATA:  Restaging treatment strategy for non-small cell lung cancer. EXAM: NUCLEAR MEDICINE PET SKULL BASE TO THIGH TECHNIQUE: 6.15 mCi F-18 FDG was injected intravenously. Full-ring PET imaging was performed from the skull base to thigh after the radiotracer. CT data was obtained and used for attenuation correction and anatomic localization. Fasting blood glucose: 93 mg/dl COMPARISON:  CT chest 10/10/2020. PET-CT 07/07/2019 FINDINGS: Mediastinal blood pool activity: SUV max 2.69 Liver activity: SUV max NA NECK: No hypermetabolic lymph nodes in the neck. Incidental CT findings: There is asymmetric mucoperiosteal thickening and opacification of the right maxillary sinus compatible with chronic sinus inflammation. CHEST: Enlarging nodule within the posterolateral left lower lobe measures 1.2 cm with SUV max of 1.3, image 42/8. On the previous PET scan this measured 0.7 cm within SUV max of 0.9. Nodule within the posteromedial left lower lobe measures 1.3 cm and has an SUV max of 2.45, image 50/8. On the previous PET scan this measured 0.6 cm within SUV max of 1.1. Within the inferior right middle lobe there is a focal area of nodular architectural distortion measuring 2.3 cm with SUV max of 2.16, image 55/8. Stable from previous PET-CT. Sub solid nodule within the periphery of the right upper lobe measures 5 mm and appears unchanged from previous exam, image 21/8.  This is technically too small to reliably characterize. Varying degrees of mild to moderate FDG uptake is associated with morphologically benign appearing mediastinal and hilar lymph nodes. -index subcarinal lymph node has an SUV max of 4.33, image 72/4. Previously 4.4 -right hilar lymph node has an SUV max of 4.28. This is compared with 5.06 previously -left hilar lymph node has an SUV max of 4.88.  Previously 4.13. Incidental CT findings: Post radiation change identified within the subpleural aspect of the anterior right upper lobe. Bandlike area of architectural distortion encases a suture chain within the left mid lung along the oblique fissure. This appears unchanged from previous exam with mild, nonspecific uptake, SUV max equal to 2.97. Previously 2.04. ABDOMEN/PELVIS: No abnormal hypermetabolic activity within the liver, pancreas, adrenal glands, or spleen. No hypermetabolic lymph nodes in the abdomen or pelvis. Incidental CT findings: Status post cholecystectomy. Aortic atherosclerosis. No aneurysm. Distal colonic diverticulosis without signs of acute inflammation. SKELETON: No focal hypermetabolic activity to suggest skeletal metastasis. Incidental CT findings: none IMPRESSION: 1. There is mild FDG uptake associated with the enlarging nodule within the posterolateral left lower lobe with SUV max of 1.3. When compared with the PET-CT from 07/26/2019  this nodule has increased in size and degree of FDG uptake. This degree of uptake is nonspecific but does not exclude underlying malignancy given the interval growth and increased tracer uptake compared with previous PET-CT. 2. Compared with the previous PET-CT this nodule has increased in size and degree of FDG uptake. Cannot exclude slow growing indolent neoplasm. 3. No significant change in size or degree of FDG uptake associated with the subpleural nodular architectural distortion within the inferior right middle lobe. 4. Stable appearance of mildly  hypermetabolic, morphologically benign appearing, mediastinal and hilar lymph nodes. Given the relative stability of these lymph nodes and degree of FDG uptake this is favored to represent benign metabolic activity. 5. Aortic Atherosclerosis (ICD10-I70.0) and Emphysema (ICD10-J43.9). Electronically Signed   By: Kerby Moors M.D.   On: 10/26/2020 12:47       IMPRESSION/PLAN: 1. Multifocal Stage IA, pT1aN0 NSCLC, adenocarcinoma of the LLL versus progressive disease in the LLL. Dr. Lisbeth Renshaw discusses the imaging findings and her course since her last visit.  Agrees with the findings of the 2 nodules of the left lower lobe appear concerning, but given her history would favor treatment with SBRT and discusses that while tissue confirmation would not be ideal, the risks for this particular patient outweigh the benefits of requiring biopsy prior to proceeding.  The patient is also in agreement and understands the limitations of proceeding without tissue confirmation.  She is not a surgical candidate after speaking with Dr Roxan Hockey again. We discussed the risks, benefits, short, and long term effects of radiotherapy, as well as the curative intent, and the patient is interested in proceeding. Dr. Lisbeth Renshaw discusses the delivery and logistics of radiotherapy and anticipates a course of 5 fractions of SBRT radiotherapy.  She will come in for simulation on Friday at which time she will sign written consent to proceed. 2. Stage IA, NSCLC, adenocarcinoma who underwent a left lower lobe superior segmentectomy in June 2016. She remains NED from prior surgery. This will be followed along with #1 in surveillance. 3. Stage IA, pT1bN0, grade 1 ER/PR positive invasive ductal carcinoma of the right breast. The patient will continue to follow up with Burr Medico in surveillance. 4. RUL Nodule.  This was felt to measure 5 mm and below PET resolution and stable over prior imaging.  We will continue to follow this with subsequent imaging  and surveillance.   Given current concerns for patient exposure during the COVID-19 pandemic, this encounter was conducted via telephone.  The patient has provided two factor identification and has given verbal consent for this type of encounter and has been advised to only accept a meeting of this type in a secure network environment. The time spent during this encounter was 45 minutes including preparation, discussion, and coordination of the patient's care. The attendants for this meeting include  Dr. Lisbeth Renshaw, Hayden Pedro  and [name].  During the encounter,   Dr. Lisbeth Renshaw, and Hayden Pedro were located at [facility] Radiation Oncology Department.  Melissa Snyder was located at home.   The above documentation reflects my direct findings during this shared patient visit. Please see the separate note by Dr. Lisbeth Renshaw on this date for the remainder of the patient's plan of care.    Carola Rhine, PAC

## 2020-11-09 NOTE — Progress Notes (Signed)
I followed up on Melissa Snyder's molecular testing.  I check the [facility] portal and did not see it was resulted or pending. I reached out to pathology dept to have them check on the status.  Wait for response.

## 2020-11-11 ENCOUNTER — Ambulatory Visit
Admission: RE | Admit: 2020-11-11 | Discharge: 2020-11-11 | Disposition: A | Discharge status: Home / Self Care | Payer: Medicare HMO | Source: Ambulatory Visit | Attending: Radiation Oncology | Admitting: Radiation Oncology

## 2020-11-11 ENCOUNTER — Other Ambulatory Visit: Payer: Self-pay

## 2020-11-11 ENCOUNTER — Encounter (HOSPITAL_COMMUNITY): Payer: Self-pay | Admitting: Emergency Medicine

## 2020-11-11 ENCOUNTER — Ambulatory Visit (HOSPITAL_COMMUNITY)
Admission: EM | Admit: 2020-11-11 | Discharge: 2020-11-11 | Disposition: A | Discharge status: Home / Self Care | Payer: Medicare HMO | Attending: Medical Oncology | Admitting: Medical Oncology

## 2020-11-11 DIAGNOSIS — S20469A Insect bite (nonvenomous) of unspecified back wall of thorax, initial encounter: Secondary | ICD-10-CM | POA: Diagnosis not present

## 2020-11-11 DIAGNOSIS — W57XXXA Bitten or stung by nonvenomous insect and other nonvenomous arthropods, initial encounter: Secondary | ICD-10-CM

## 2020-11-11 DIAGNOSIS — Z510 Encounter for antineoplastic radiation therapy: Secondary | ICD-10-CM | POA: Insufficient documentation

## 2020-11-11 DIAGNOSIS — Z923 Personal history of irradiation: Secondary | ICD-10-CM | POA: Diagnosis not present

## 2020-11-11 DIAGNOSIS — C3432 Malignant neoplasm of lower lobe, left bronchus or lung: Secondary | ICD-10-CM | POA: Diagnosis not present

## 2020-11-11 MED ORDER — DOXYCYCLINE HYCLATE 100 MG PO CAPS: 100.0000 mg | ORAL_CAPSULE | Freq: Two times a day (BID) | ORAL | 0 refills | Status: DC

## 2020-11-11 NOTE — ED Triage Notes (Signed)
Pt presents for follow-up with tick bite xs 10 days ago. States was given cream by PCP and used it for seven days with improvement. States today "has not felt good" and site of bite itches.

## 2020-11-11 NOTE — ED Provider Notes (Signed)
[name]    CSN: [id number] Arrival date & time: 11/11/20  1921      History   Chief Complaint Chief Complaint  Patient presents with   Insect Bite    HPI Melissa Snyder is a 87 y.o. female.   HPI  Tick Bite: Patient removed a tick from her back with assistance of her husband and tweezers almost 14 days ago.  She reports that the tick had likely been on her for at least 2 days.  The tick was attached.  She states that over the past day she has noticed fatigue, mild headache.  She states that she now has a rash around the site of the tick bite that looks like a bull's-eye rash.  She denies any known fevers, neurological changes, vomiting.  Of note patient states that she has a history of whitecoat syndrome.  She states that her blood pressure in clinic's is typically elevated and at home is around 1 66-0 40 systolic.  Past Medical History:  Diagnosis Date   Adenocarcinoma ([place])    recurrent/notes 07/03/2017   Blind left eye 10/2013   cva   Breast cancer ([place]) 05/2014   ER+/PR+/Her2-     LUNG CANCER   Bundle branch block left    Chest pain    Family history of bladder cancer    Family history of breast cancer    Family history of prostate cancer    GERD (gastroesophageal reflux disease)    History of blood transfusion    age 45   Hyperlipidemia    Hypertension    Hypothyroid    Lung nodule    Melanoma in situ of face ([place]) JUNE 2015   LEFT CHEEK   Meningioma ([place])    brain lining   Migraine    reports only having one   Osteoarthritis    Personal history of radiation therapy    Radiation 09/16/14-10/07/14   Right  Breast  21 fractions   Skin cancer    Stroke ([place]) 2015   Blind in left eye as a result    Patient Active Problem List   Diagnosis Date Noted   Essential hypertension, benign 07/09/2017   Hypothyroidism due to acquired atrophy of thyroid 07/09/2017   Constipation due to opioid therapy 07/09/2017   Osteoporosis 07/09/2017   Anemia  associated with acute blood loss 07/09/2017   Closed fracture of acetabulum with dislocation of left hip ([place]) 07/03/2017   S/p left hip fracture 07/03/2017   Primary cancer of left lower lobe of lung ([place]) 11/25/2014   Genetic testing 07/01/2014   Family history of breast cancer    Family history of bladder cancer    Family history of prostate cancer    Breast cancer of upper-outer quadrant of right female breast ([place]) 05/04/2014   Arthritis    Hyperlipidemia 01/12/2014   Meningioma ([place]) 01/04/2014   Melanoma in situ of face ([place]) 11/02/2013   Central retinal artery occlusion of left eye 10/24/2013   Hypothyroidism 10/24/2013   GERD (gastroesophageal reflux disease) 10/24/2013    Past Surgical History:  Procedure Laterality Date   ABDOMINAL HYSTERECTOMY  ~ 1997   APPENDECTOMY     age 84   BILATERAL SALPINGOOPHORECTOMY     BLADDER REPAIR  2009   cystocele   BRAIN MENINGIOMA EXCISION     Gamma knife to Meningioma in brain lining   BREAST BIOPSY     BREAST LUMPECTOMY Right 07/23/2014   invasive ductal  BUNIONECTOMY     bilateral great toe   CARDIAC CATHETERIZATION  2004   CHOLECYSTECTOMY  ~ 1997   COLONOSCOPY     CRYO INTERCOSTAL NERVE BLOCK Left 11/25/2014   Procedure: CRYO INTERCOSTAL NERVE BLOCK;  Surgeon: [name], MD;  Location: [facility];  Service: Thoracic;  Laterality: Left;   CT RADIATION THERAPY GUIDE     Gamma radiation -lt frontal-[facility]   DILATION AND CURETTAGE OF UTERUS     X 2   HIP ARTHROPLASTY Left 07/04/2017   Procedure: ARTHROPLASTY BIPOLAR HIP (HEMIARTHROPLASTY);  Surgeon: Tania Ade, MD;  Location: [facility];  Service: Orthopedics;  Laterality: Left;   MELANOMA EXCISION Left 11/05/23, 11/30/13   CHEEK   SEGMENTECOMY Left 11/25/2014   Procedure: LEFT LOWER LOBE SEGMENTECTOMY;  Surgeon: [name], MD;  Location: [facility];  Service: Thoracic;  Laterality: Left;   VIDEO ASSISTED THORACOSCOPY Left 11/25/2014   Procedure: VIDEO ASSISTED  THORACOSCOPY;  Surgeon: [name], MD;  Location: [facility];  Service: Thoracic;  Laterality: Left;   VIDEO BRONCHOSCOPY WITH ENDOBRONCHIAL NAVIGATION N/A 06/19/2017   Procedure: VIDEO BRONCHOSCOPY;  Surgeon: [name], MD;  Location: [facility];  Service: Thoracic;  Laterality: N/A;   VIDEO BRONCHOSCOPY WITH ENDOBRONCHIAL ULTRASOUND N/A 11/25/2014   Procedure: VIDEO BRONCHOSCOPY WITH ENDOBRONCHIAL ULTRASOUND;  Surgeon: [name], MD;  Location: [facility];  Service: Thoracic;  Laterality: N/A;    OB History   No obstetric history on file.      Home Medications    Prior to Admission medications   Medication Sig Start Date End Date Taking? Authorizing Provider  aspirin 81 MG EC tablet  05/09/09   [provider]  atenolol (TENORMIN) 50 MG tablet Take 50 mg by mouth at bedtime.     [provider]  b complex vitamins capsule Take 1 capsule by mouth daily.    [provider]  calcium carbonate (OSCAL) 1500 (600 Ca) MG TABS tablet  04/25/11   [provider]  calcium-vitamin D (OSCAL WITH D) 500-200 MG-UNIT per tablet Take 1 tablet by mouth at bedtime.     [provider]  chlorthalidone (HYGROTON) 25 MG tablet Take 25 mg by mouth 3 (three) times a week. 08/11/20   [provider]  Cholecalciferol (VITAMIN D3) 1000 units CAPS Take by mouth daily.    [provider]  denosumab (PROLIA) 60 MG/ML SOSY injection  11/03/15   [provider]  Digestive Enzymes (ENZYME DIGEST PO) Take 5 mLs by mouth 3 (three) times daily with meals.    [provider]  fish oil-omega-3 fatty acids 1000 MG capsule Take 1 g by mouth daily.     [provider]  ketoconazole (NIZORAL) 2 % cream Apply 1 application topically 2 (two) times daily. 02/11/20   Hyatt, Max T, DPM  levothyroxine (SYNTHROID, LEVOTHROID) 112 MCG tablet Take 112 mcg by mouth daily before breakfast.     [provider]  lisinopril  (PRINIVIL,ZESTRIL) 10 MG tablet Take 1 tablet (10 mg total) by mouth daily. Patient taking differently: Take 10 mg by mouth 2 (two) times daily. 11/29/14   Gold, Wilder Glade, PA-C  meclizine (ANTIVERT) 12.5 MG tablet Take 12.5 mg by mouth 3 (three) times daily as needed for dizziness.    [provider]  Multiple Vitamin (MULTIVITAMIN) tablet Take 1 tablet by mouth every morning.     [provider]  Omega-3 1000 MG CAPS Take 1 g by mouth daily.  [provider]  Polyvinyl Alcohol-Povidone (REFRESH OP) Apply 1 drop to eye as needed (DRY EYES).     [provider]  Triamcinolone Acetonide (NASACORT AQ NA) Place 1 spray into the nose at bedtime as needed (CONGESTION).    [provider]  vitamin E 180 MG (400 UNITS) capsule Take 400 Units by mouth every Monday, Wednesday, and Friday. Mon Wed Fri    [provider]    Family History Family History  Problem Relation Age of Onset   CAD Mother        CABG   Hyperlipidemia Mother    AAA (abdominal aortic aneurysm) Mother    Renal Disease Father    Kidney disease Father    CAD Father    Cancer Father        PROSTATE   Diabetes Brother    Hyperlipidemia Brother    Breast cancer Daughter 60   Breast cancer Sister 44   Breast cancer Sister 26   Cancer Brother        NOS   Bladder Cancer Brother 83   Lung cancer Cousin        3 paternal cousins with lung cancer   Breast cancer Cousin        five paternal first cousins   Cancer Cousin        4 paternal first cousins with Cancer NOS   Cancer Cousin        1 paternal cousin with oral cancer (tongue)    Social History Social History   Tobacco Use   Smoking status: Never   Smokeless tobacco: Never  Vaping Use   Vaping Use: Never used  Substance Use Topics   Alcohol use: No   Drug use: No     Allergies   Beef-derived products, Other, Chocolate, Ibuprofen, Niacin and related, and Tramadol   Review of Systems Review of  Systems  As stated above in HPI Physical Exam Triage Vital Signs ED Triage Vitals  Enc Vitals Group     BP 11/11/20 1951 (!) 186/72     Pulse Rate 11/11/20 1951 70     Resp 11/11/20 1951 16     Temp 11/11/20 1951 98 F (36.7 C)     Temp Source 11/11/20 1951 Oral     SpO2 11/11/20 1951 100 %     Weight --      Height --      Head Circumference --      Peak Flow --      Pain Score 11/11/20 1949 0     Pain Loc --      Pain Edu? --      Excl. in [place]? --    No data found.  Updated Vital Signs BP (!) 186/72 (BP Location: Left Arm)   Pulse 70   Temp 98 F (36.7 C) (Oral)   Resp 16   SpO2 100%   Physical Exam Vitals and nursing note reviewed.  Constitutional:      General: She is not in acute distress.    Appearance: Normal appearance. She is not ill-appearing, toxic-appearing or diaphoretic.  HENT:     Head: Normocephalic and atraumatic.  Eyes:     Extraocular Movements: Extraocular movements intact.     Pupils: Pupils are equal, round, and reactive to light.  Cardiovascular:     Rate and Rhythm: Normal rate and regular rhythm.     Heart sounds: Normal heart sounds.  Pulmonary:     Effort:  Pulmonary effort is normal.     Breath sounds: Normal breath sounds.  Musculoskeletal:     Cervical back: Normal range of motion and neck supple.  Skin:    General: Skin is warm.     Comments: There is a 2 inch bullseye rash of the back in the area of the healing tick bite. No foreign bodies remaining. NO discharge or erythema.   Neurological:     Mental Status: She is alert and oriented to person, place, and time.     Cranial Nerves: No cranial nerve deficit.     Sensory: No sensory deficit.     Motor: No weakness.     Coordination: Coordination normal.     Gait: Gait normal.     Deep Tendon Reflexes: Reflexes normal.  Psychiatric:        Mood and Affect: Mood normal.        Behavior: Behavior normal.     UC Treatments / Results  Labs (all labs ordered are listed, but  only abnormal results are displayed) Labs Reviewed - No data to display  EKG   Radiology No results found.  Procedures Procedures (including critical care time)  Medications Ordered in UC Medications - No data to display  Initial Impression / Assessment and Plan / UC Course  I have reviewed the triage vital signs and the nursing notes.  Pertinent labs & imaging results that were available during my care of the patient were reviewed by me and considered in my medical decision making (see chart for details).     New.  Discussed with patient.  I would like to proceed forward with treating her for potential Lyme disease with doxycycline to prevent further systemic illness.  We discussed how to use this medication along with common potential side effects and precautions.  She should begin to feel better over the next few days.  If not she should have follow-up. Final Clinical Impressions(s) / UC Diagnoses   Final diagnoses:  None   Discharge Instructions   None    ED Prescriptions   None    PDMP not reviewed this encounter.   Hughie Closs, Hershal Coria 11/11/20 2002

## 2020-11-15 DIAGNOSIS — S72002S Fracture of unspecified part of neck of left femur, sequela: Secondary | ICD-10-CM | POA: Diagnosis not present

## 2020-11-15 DIAGNOSIS — Z780 Asymptomatic menopausal state: Secondary | ICD-10-CM | POA: Diagnosis not present

## 2020-11-15 DIAGNOSIS — C50919 Malignant neoplasm of unspecified site of unspecified female breast: Secondary | ICD-10-CM | POA: Diagnosis not present

## 2020-11-15 DIAGNOSIS — M810 Age-related osteoporosis without current pathological fracture: Secondary | ICD-10-CM | POA: Diagnosis not present

## 2020-11-21 DIAGNOSIS — C3432 Malignant neoplasm of lower lobe, left bronchus or lung: Secondary | ICD-10-CM | POA: Diagnosis not present

## 2020-11-21 DIAGNOSIS — Z923 Personal history of irradiation: Secondary | ICD-10-CM | POA: Diagnosis not present

## 2020-11-21 DIAGNOSIS — Z510 Encounter for antineoplastic radiation therapy: Secondary | ICD-10-CM | POA: Diagnosis not present

## 2020-11-22 ENCOUNTER — Other Ambulatory Visit: Payer: Self-pay

## 2020-11-22 ENCOUNTER — Ambulatory Visit
Admission: RE | Admit: 2020-11-22 | Discharge: 2020-11-22 | Disposition: A | Discharge status: Home / Self Care | Payer: Medicare HMO | Source: Ambulatory Visit | Attending: Radiation Oncology | Admitting: Radiation Oncology

## 2020-11-22 DIAGNOSIS — Z510 Encounter for antineoplastic radiation therapy: Secondary | ICD-10-CM | POA: Diagnosis not present

## 2020-11-22 DIAGNOSIS — C3432 Malignant neoplasm of lower lobe, left bronchus or lung: Secondary | ICD-10-CM | POA: Diagnosis not present

## 2020-11-23 ENCOUNTER — Ambulatory Visit: Payer: Medicare HMO | Admitting: Radiation Oncology

## 2020-11-24 ENCOUNTER — Ambulatory Visit
Admission: RE | Admit: 2020-11-24 | Discharge: 2020-11-24 | Disposition: A | Discharge status: Home / Self Care | Payer: Medicare HMO | Source: Ambulatory Visit | Attending: Radiation Oncology | Admitting: Radiation Oncology

## 2020-11-24 ENCOUNTER — Other Ambulatory Visit: Payer: Self-pay

## 2020-11-24 DIAGNOSIS — C3432 Malignant neoplasm of lower lobe, left bronchus or lung: Secondary | ICD-10-CM | POA: Diagnosis not present

## 2020-11-24 DIAGNOSIS — Z510 Encounter for antineoplastic radiation therapy: Secondary | ICD-10-CM | POA: Diagnosis not present

## 2020-11-25 ENCOUNTER — Telehealth: Payer: Self-pay

## 2020-11-25 ENCOUNTER — Ambulatory Visit: Payer: Medicare HMO | Admitting: Radiation Oncology

## 2020-11-25 NOTE — Telephone Encounter (Signed)
Miss Melissa Snyder called stating she is having back LL back spasms when she is standing for a > 20 minutes.  She state it is less when she is laying down.  She wanted to know if this was related to her radiation therapy.  I am not sure of the radiation field and was unable to comment.  I recommended she try theraworx.  She states she did and It did not help.  I explained that Dr Burr Medico and Cira Rue are not in the office at this time and I recommend she call her PCP office.  She verbalized understanding.

## 2020-11-28 ENCOUNTER — Ambulatory Visit
Admission: RE | Admit: 2020-11-28 | Discharge: 2020-11-28 | Disposition: A | Discharge status: Home / Self Care | Payer: Medicare HMO | Source: Ambulatory Visit | Attending: Radiation Oncology | Admitting: Radiation Oncology

## 2020-11-28 ENCOUNTER — Other Ambulatory Visit: Payer: Self-pay

## 2020-11-28 DIAGNOSIS — C3432 Malignant neoplasm of lower lobe, left bronchus or lung: Secondary | ICD-10-CM | POA: Diagnosis not present

## 2020-11-28 DIAGNOSIS — Z510 Encounter for antineoplastic radiation therapy: Secondary | ICD-10-CM | POA: Diagnosis not present

## 2020-11-29 ENCOUNTER — Telehealth: Payer: Self-pay | Admitting: *Deleted

## 2020-11-29 NOTE — Telephone Encounter (Signed)
Spoke with the patient to let her know that muscle spasms she is having in her lower back would not be caused by the radiation she is receiving to her lung.  She states she is eating and drinking well.  She states she has not had any more spasms since Sunday.  She was advised to contact her PCP if they should return.  She verbalized understanding.  Gloriajean Dell. Leonie Green, BSN

## 2020-11-30 ENCOUNTER — Other Ambulatory Visit: Payer: Self-pay

## 2020-11-30 ENCOUNTER — Ambulatory Visit
Admission: RE | Admit: 2020-11-30 | Discharge: 2020-11-30 | Disposition: A | Discharge status: Home / Self Care | Payer: Medicare HMO | Source: Ambulatory Visit | Attending: Radiation Oncology | Admitting: Radiation Oncology

## 2020-11-30 DIAGNOSIS — C3432 Malignant neoplasm of lower lobe, left bronchus or lung: Secondary | ICD-10-CM | POA: Diagnosis not present

## 2020-11-30 DIAGNOSIS — Z510 Encounter for antineoplastic radiation therapy: Secondary | ICD-10-CM | POA: Diagnosis not present

## 2020-11-30 NOTE — Progress Notes (Signed)
  Radiation Oncology         [phone] ________________________________  Name: Melissa Snyder MRN: [id number]  Date: 11/30/2020  DOB: [birth year]  End of Treatment Note  Diagnosis:   Multifocal Stage IA, pT1aN0 NSCLC, adenocarcinoma of the LLL versus progressive disease in the LLL.  Indication for treatment:  Curative       Radiation treatment dates:   11/22/20-12/02/20  Site/dose:   The two nodules  in the LLL was treated with a course of stereotactic body radiation treatment.  The patient will complete her course of 60 Gy in 5 fractions at 12 Gy per fraction this Friday. She's had 4/5 planned treatments thus far.  Narrative: The patient tolerated radiation treatment relatively well.   The patient did not have any signs of acute toxicity during treatment. She did have some muscle spasms in her back during radiation on the left side 2 days after her first treatment. It was not clear that these were related to radiation. She states these resolved after a few days despite heat, ice, and positional changes and OTC analgesics. She did not have these symptoms after her treatment earlier this week.  Plan: The patient has completed radiation treatment. The patient will receive a call from the radiation oncology clinic for routine followup in one month. She will follow up with Dr. Burr Medico in November 2022 at which time she will likely have had another CT of the chest.  I advised the patient to call or return sooner if she has any questions or concerns related to her recovery or treatment.      Carola Rhine, PAC

## 2020-12-01 DIAGNOSIS — I10 Essential (primary) hypertension: Secondary | ICD-10-CM | POA: Diagnosis not present

## 2020-12-01 DIAGNOSIS — E785 Hyperlipidemia, unspecified: Secondary | ICD-10-CM | POA: Diagnosis not present

## 2020-12-01 DIAGNOSIS — M1990 Unspecified osteoarthritis, unspecified site: Secondary | ICD-10-CM | POA: Diagnosis not present

## 2020-12-02 ENCOUNTER — Encounter: Payer: Self-pay | Admitting: Radiation Oncology

## 2020-12-02 ENCOUNTER — Ambulatory Visit
Admission: RE | Admit: 2020-12-02 | Discharge: 2020-12-02 | Disposition: A | Discharge status: Home / Self Care | Payer: Medicare HMO | Source: Ambulatory Visit | Attending: Radiation Oncology | Admitting: Radiation Oncology

## 2020-12-02 DIAGNOSIS — C3432 Malignant neoplasm of lower lobe, left bronchus or lung: Secondary | ICD-10-CM | POA: Insufficient documentation

## 2020-12-02 DIAGNOSIS — Z510 Encounter for antineoplastic radiation therapy: Secondary | ICD-10-CM | POA: Insufficient documentation

## 2020-12-02 DIAGNOSIS — Z923 Personal history of irradiation: Secondary | ICD-10-CM | POA: Diagnosis not present

## 2020-12-03 NOTE — Progress Notes (Signed)
                                                                                                                                                             Patient Name: Melissa Snyder MRN: [id number] DOB: 16-Nov-1933 Referring Physician: [name] (Profile Not Attached) Date of Service: 12/02/2020 [facility], [place]                                                        End Of Treatment Note  Diagnoses: C34.32-Malignant neoplasm of lower lobe, left bronchus or lung C50.412-Malignant neoplasm of upper-outer quadrant of left female breast C50.419-Malig neoplasm of upper-outer quadrant of unsp female breast C50.911-Malignant neoplasm of unspecified site of right female breast  Cancer Staging: Multifocal Stage IA, pT1aN0 NSCLC, adenocarcinoma of the LLL versus progressive disease in the LLL.  Intent: Curative  Radiation Treatment Dates: 11/22/2020 through 12/02/2020 Site Technique Total Dose (Gy) Dose per Fx (Gy) Completed Fx Beam Energies  Lung, Left: Lung_Lt_LLL IMRT 60/60 12 5/5 6XFFF   Narrative: The patient tolerated radiation therapy relatively well. She did complain of some back spasms which resolved a few days after one of her treatments.  Plan: The patient will receive a call from the radiation oncology clinic for routine followup in one month. She will follow up with Dr. Burr Medico in November 2022.  ________________________________________________    Carola Rhine, [facility]

## 2020-12-29 NOTE — Progress Notes (Signed)
  Radiation Oncology         [phone] ________________________________  Name: Melissa Snyder MRN: [id number]  Date of Service: 01/09/2021  DOB: Jul 18, 1933  Post Treatment Telephone Note  Diagnosis:   Multifocal Stage IA, pT1aN0 NSCLC, adenocarcinoma of the LLL versus progressive disease in the LLL.  Interval Since Last Radiation:  6 weeks   11/22/2020 through 12/02/2020 Site Technique Total Dose (Gy) Dose per Fx (Gy) Completed Fx Beam Energies  Lung, Left: Lung_Lt_LLL IMRT 60/60 12 5/5 6XFFF    Narrative:  The patient was contacted today for routine follow-up. During treatment she did very well with radiotherapy and did not have significant desquamation. She reports she was having trouble with muscle spasms on the first treatment that lingered for many hours and fatigue that were significant to her. She feels like her spasms did not come on again, and her fatigue did linger for about 2-3 weeks after treatment but she feels much more like her usual self.  Impression/Plan: 1. Multifocal Stage IA, pT1aN0 NSCLC, adenocarcinoma of the LLL versus progressive disease in the LLL. The patient has been doing well since completion of radiotherapy. We discussed that we would be happy to continue to follow her as needed, but she will also continue to follow up with Dr. Burr Medico in medical oncology.         Carola Rhine, PAC

## 2021-01-09 ENCOUNTER — Other Ambulatory Visit: Payer: Self-pay

## 2021-01-09 ENCOUNTER — Ambulatory Visit
Admission: RE | Admit: 2021-01-09 | Discharge: 2021-01-09 | Disposition: A | Discharge status: Home / Self Care | Payer: Medicare HMO | Source: Ambulatory Visit | Attending: Radiation Oncology | Admitting: Radiation Oncology

## 2021-01-09 DIAGNOSIS — C3432 Malignant neoplasm of lower lobe, left bronchus or lung: Secondary | ICD-10-CM | POA: Insufficient documentation

## 2021-01-09 DIAGNOSIS — C50411 Malignant neoplasm of upper-outer quadrant of right female breast: Secondary | ICD-10-CM

## 2021-01-09 DIAGNOSIS — Z170 Estrogen receptor positive status [ER+]: Secondary | ICD-10-CM | POA: Insufficient documentation

## 2021-02-01 DIAGNOSIS — E785 Hyperlipidemia, unspecified: Secondary | ICD-10-CM | POA: Diagnosis not present

## 2021-02-01 DIAGNOSIS — I10 Essential (primary) hypertension: Secondary | ICD-10-CM | POA: Diagnosis not present

## 2021-02-01 DIAGNOSIS — M810 Age-related osteoporosis without current pathological fracture: Secondary | ICD-10-CM | POA: Diagnosis not present

## 2021-02-01 DIAGNOSIS — D229 Melanocytic nevi, unspecified: Secondary | ICD-10-CM | POA: Diagnosis not present

## 2021-02-01 DIAGNOSIS — L814 Other melanin hyperpigmentation: Secondary | ICD-10-CM | POA: Diagnosis not present

## 2021-02-01 DIAGNOSIS — L905 Scar conditions and fibrosis of skin: Secondary | ICD-10-CM | POA: Diagnosis not present

## 2021-02-01 DIAGNOSIS — M7981 Nontraumatic hematoma of soft tissue: Secondary | ICD-10-CM | POA: Diagnosis not present

## 2021-02-01 DIAGNOSIS — L821 Other seborrheic keratosis: Secondary | ICD-10-CM | POA: Diagnosis not present

## 2021-02-01 DIAGNOSIS — Z85820 Personal history of malignant melanoma of skin: Secondary | ICD-10-CM | POA: Diagnosis not present

## 2021-02-01 DIAGNOSIS — I8393 Asymptomatic varicose veins of bilateral lower extremities: Secondary | ICD-10-CM | POA: Diagnosis not present

## 2021-02-01 DIAGNOSIS — D1801 Hemangioma of skin and subcutaneous tissue: Secondary | ICD-10-CM | POA: Diagnosis not present

## 2021-02-01 DIAGNOSIS — M1990 Unspecified osteoarthritis, unspecified site: Secondary | ICD-10-CM | POA: Diagnosis not present

## 2021-03-27 DIAGNOSIS — I10 Essential (primary) hypertension: Secondary | ICD-10-CM | POA: Diagnosis not present

## 2021-03-27 DIAGNOSIS — M810 Age-related osteoporosis without current pathological fracture: Secondary | ICD-10-CM | POA: Diagnosis not present

## 2021-03-27 DIAGNOSIS — E039 Hypothyroidism, unspecified: Secondary | ICD-10-CM | POA: Diagnosis not present

## 2021-03-27 DIAGNOSIS — E785 Hyperlipidemia, unspecified: Secondary | ICD-10-CM | POA: Diagnosis not present

## 2021-04-03 DIAGNOSIS — Z23 Encounter for immunization: Secondary | ICD-10-CM | POA: Diagnosis not present

## 2021-04-03 DIAGNOSIS — I10 Essential (primary) hypertension: Secondary | ICD-10-CM | POA: Diagnosis not present

## 2021-04-03 DIAGNOSIS — R918 Other nonspecific abnormal finding of lung field: Secondary | ICD-10-CM | POA: Diagnosis not present

## 2021-04-03 DIAGNOSIS — Z1331 Encounter for screening for depression: Secondary | ICD-10-CM | POA: Diagnosis not present

## 2021-04-03 DIAGNOSIS — M810 Age-related osteoporosis without current pathological fracture: Secondary | ICD-10-CM | POA: Diagnosis not present

## 2021-04-03 DIAGNOSIS — M1990 Unspecified osteoarthritis, unspecified site: Secondary | ICD-10-CM | POA: Diagnosis not present

## 2021-04-03 DIAGNOSIS — E039 Hypothyroidism, unspecified: Secondary | ICD-10-CM | POA: Diagnosis not present

## 2021-04-03 DIAGNOSIS — Z0000 Encounter for general adult medical examination without abnormal findings: Secondary | ICD-10-CM | POA: Diagnosis not present

## 2021-04-03 DIAGNOSIS — M25552 Pain in left hip: Secondary | ICD-10-CM | POA: Diagnosis not present

## 2021-04-03 DIAGNOSIS — Z1339 Encounter for screening examination for other mental health and behavioral disorders: Secondary | ICD-10-CM | POA: Diagnosis not present

## 2021-04-03 DIAGNOSIS — E785 Hyperlipidemia, unspecified: Secondary | ICD-10-CM | POA: Diagnosis not present

## 2021-04-03 DIAGNOSIS — N8110 Cystocele, unspecified: Secondary | ICD-10-CM | POA: Diagnosis not present

## 2021-04-03 DIAGNOSIS — R82998 Other abnormal findings in urine: Secondary | ICD-10-CM | POA: Diagnosis not present

## 2021-04-03 DIAGNOSIS — D329 Benign neoplasm of meninges, unspecified: Secondary | ICD-10-CM | POA: Diagnosis not present

## 2021-04-28 ENCOUNTER — Other Ambulatory Visit: Payer: Self-pay

## 2021-04-28 DIAGNOSIS — Z170 Estrogen receptor positive status [ER+]: Secondary | ICD-10-CM

## 2021-04-28 DIAGNOSIS — C50411 Malignant neoplasm of upper-outer quadrant of right female breast: Secondary | ICD-10-CM

## 2021-04-28 NOTE — Progress Notes (Signed)
Orders for CBC w/Diff & CMP ordered.

## 2021-05-01 ENCOUNTER — Inpatient Hospital Stay: Payer: Medicare HMO | Attending: Hematology | Admitting: Hematology

## 2021-05-01 ENCOUNTER — Inpatient Hospital Stay: Payer: Medicare HMO

## 2021-05-01 ENCOUNTER — Other Ambulatory Visit: Payer: Self-pay

## 2021-05-01 ENCOUNTER — Encounter: Payer: Self-pay | Admitting: Hematology

## 2021-05-01 VITALS — BP 157/59 | HR 72 | Temp 97.9°F | Resp 18 | Ht 65.25 in | Wt 118.9 lb

## 2021-05-01 DIAGNOSIS — Z923 Personal history of irradiation: Secondary | ICD-10-CM | POA: Diagnosis not present

## 2021-05-01 DIAGNOSIS — C50411 Malignant neoplasm of upper-outer quadrant of right female breast: Secondary | ICD-10-CM

## 2021-05-01 DIAGNOSIS — Z170 Estrogen receptor positive status [ER+]: Secondary | ICD-10-CM

## 2021-05-01 DIAGNOSIS — C3432 Malignant neoplasm of lower lobe, left bronchus or lung: Secondary | ICD-10-CM | POA: Diagnosis not present

## 2021-05-01 DIAGNOSIS — Z853 Personal history of malignant neoplasm of breast: Secondary | ICD-10-CM | POA: Diagnosis not present

## 2021-05-01 LAB — CBC WITH DIFFERENTIAL (CANCER CENTER ONLY)
Abs Immature Granulocytes: 0.01 10*3/uL (ref 0.00–0.07)
Basophils Absolute: 0 10*3/uL (ref 0.0–0.1)
Basophils Relative: 1 %
Eosinophils Absolute: 0.2 10*3/uL (ref 0.0–0.5)
Eosinophils Relative: 4 %
HCT: 37.5 % (ref 36.0–46.0)
Hemoglobin: 12.3 g/dL (ref 12.0–15.0)
Immature Granulocytes: 0 %
Lymphocytes Relative: 14 %
Lymphs Abs: 0.7 10*3/uL (ref 0.7–4.0)
MCH: 29.2 pg (ref 26.0–34.0)
MCHC: 32.8 g/dL (ref 30.0–36.0)
MCV: 89.1 fL (ref 80.0–100.0)
Monocytes Absolute: 0.4 10*3/uL (ref 0.1–1.0)
Monocytes Relative: 9 %
Neutro Abs: 3.5 10*3/uL (ref 1.7–7.7)
Neutrophils Relative %: 72 %
Platelet Count: 156 10*3/uL (ref 150–400)
RBC: 4.21 MIL/uL (ref 3.87–5.11)
RDW: 13.8 % (ref 11.5–15.5)
WBC Count: 4.8 10*3/uL (ref 4.0–10.5)
nRBC: 0 % (ref 0.0–0.2)

## 2021-05-01 LAB — CMP (CANCER CENTER ONLY)
ALT: 17 U/L (ref 0–44)
AST: 22 U/L (ref 15–41)
Albumin: 3.9 g/dL (ref 3.5–5.0)
Alkaline Phosphatase: 116 U/L (ref 38–126)
Anion gap: 8 (ref 5–15)
BUN: 17 mg/dL (ref 8–23)
CO2: 28 mmol/L (ref 22–32)
Calcium: 9.1 mg/dL (ref 8.9–10.3)
Chloride: 103 mmol/L (ref 98–111)
Creatinine: 0.74 mg/dL (ref 0.44–1.00)
GFR, Estimated: >60 mL/min (ref >60)
Glucose, Bld: 115 mg/dL — ABNORMAL HIGH (ref 70–99)
Potassium: 4 mmol/L (ref 3.5–5.1)
Sodium: 139 mmol/L (ref 135–145)
Total Bilirubin: 0.6 mg/dL (ref 0.3–1.2)
Total Protein: 7 g/dL (ref 6.5–8.1)

## 2021-05-01 NOTE — Progress Notes (Signed)
[facility]   [phone] [phone]   Clinic Follow up Note   Patient Care Team: [name], MD as PCP - General (Internal Medicine) [name], MD as Consulting Physician (Cardiothoracic Surgery) [name], MD as Consulting Physician (Urology) [name], MD as Consulting Physician (Hematology) [name], MD as Consulting Physician (Radiation Oncology) Hayden Pedro, PA-C as Physician Assistant (Radiation Oncology)  Date of Service:  05/01/2021  CHIEF COMPLAINT: f/u of lung and breast cancer  CURRENT THERAPY:  Surveillance  ASSESSMENT & PLAN:  Melissa Snyder is a 87 y.o. female with   1. Stage I left lung adenocarcinoma, pT1aN0M0 in 2016, recurrent LLL lung adenocarcinoma in 06/2017, new lung nodules  -initially diagnosed in 11/2014. Treated with left lower lobe segmentectomy by Dr. Roxan Hockey, pathology showing stage I adenocarcinoma -She had cancer recurrence in LLL in 06/2017. She was treated with radiation in 07/2017 with Dr. Lisbeth Renshaw.  -She was seen to have 2 left lung nodules on 06/18/19 CT Chest  -CT chest from 10/10/20 and PET from 10/27/20 showed 2 enlarging nodules in the left upper lobe, measuring 1.2 and 1.3 cm, with SUV 1.3-2.45, slightly increased from PET scan in 07/2019.  These are concerning for malignancy.  -she received 5 fractions SBRT under Dr. Lisbeth Renshaw 11/2020. She tolerated with moderate to severe fatigue and chest wall soreness. She has recovered well  -I have ordered repeat chest CT to be done in next few weeks.   2. Right breast invasive ductal carcinoma, pT1b N0 M0 stage IA, ER positive PR positive and HER-2 negative. -She was diagnosed in 05/2014. She is s/p right lumpectomy and adjuvant radiation.  -I recommended her antiestrogen with Exemestane in 2016 and she declined at that time due to cost and concern with side effects. Will continue with breast cancer surveillance.  -She is clinically  stable from a breast standpoint. There is no clinical concern for recurrence. -Continue Surveillance. Her 08/2020 Mammogram showed benign left breast calcifications. I ordered repeat today.   3. Benign meningiomas -s/p gamma knife treatment in 02/2014 -followed by Dr. Salomon Fick -last brain MRI 06/04/19 was stable.     Plan: -posttreatment chest CT to be done in next few weeks  -will schedule virtual f/u to discuss results after scan -mammogram 08/2021   No problem-specific Assessment & Plan notes found for this encounter.   SUMMARY OF ONCOLOGIC HISTORY: Oncology History Overview Note   Cancer Staging  Breast cancer of upper-outer quadrant of right female breast [facility]) Staging form: Breast, AJCC 7th Edition - Clinical stage from 05/19/2014: Stage IA (T1b, N0, M0) - Signed by Holley Bouche, NP on 11/17/2015 - Pathologic stage from 10/17/2014: Stage IA (T1b, N0, cM0) - Signed by [name], MD on 10/17/2014  Primary cancer of left lower lobe of lung [facility]) Staging form: Lung, AJCC 7th Edition - Clinical stage from 12/14/2014: Stage IA (T1a, N0, M0) - Unsigned    Breast cancer of upper-outer quadrant of right female breast ([place])  05/04/2014 Initial Diagnosis   Breast cancer   05/19/2014 Imaging   Diagnostic mammogram and ultrasound showed a 7 mm irregular suspicious mass located in the right breast at the 10:00 position, 7 cm from the nipple. Otherwise negative.   05/19/2014 Initial Biopsy   Right breast biopsy (UOQ): IDC, grade 2.    05/19/2014 Receptors her2   ER 100% positive, PR 35% positive, HER-2 negative, Ki67 8%   06/07/2014 Miscellaneous   Genetic testing normal.  APC, ATM,  BARD1, BMPR1A, BRCA1, BRCA2, BRIP1, CDH1, CDK4, CDKN2A, CHEK2, EPCAM, GREM1, MLH1, MRE11A, MSH2, MSH6, MUTYH, NBN, NF1, PALB2, PMS2, POLD1, POLE, PTEN, RAD50, RAD51D, SMAD4, SMARCA4, STK11, and TP53 tested.    07/23/2014 Surgery   Right breast lumpectomy with SLNB Lucia Gaskins) showed invasive ductal  carcinoma & DCIS; negative surgical margins.   07/23/2014 Pathology Results   Invasive ductal carcinoma, tumor size 0.8 cm, grade 1, no lymphovascular invasion, margins were negative, 2 sentinel lymph nodes were negative. (+) DCIS.   07/23/2014 Pathologic Stage   pT1b, pN0: Stage IA    09/16/2014 - 10/07/2014 Radiation Therapy   XRT completed Pablo Ledger). Right breast/ 42.72 Gy at 2.67 Gy per fraction x 21 fractions.     Anti-estrogen oral therapy   Aromasin prescribed in 01/2015 but Patient declines anti-estrogen therapy at this time d/t high co-pay of medication and possible side effects of AI therapy.    05/02/2015 Mammogram   Diagnostic TOMO bilat mammogram: No evidence of malignancy in either breast. Lumpectomy changes in right breast. Diagnostic mammo suggested in 1 year.    Primary cancer of left lower lobe of lung ([place])  10/25/2013 Imaging   CXR done for stroke protocol. No acute chest findings. Chronic lung disease with biapical scarring. Developing nodule in (L) apex cannot be excluded based on exam, athough may be d/t overlap of osseous structures.    10/27/2013 Imaging   CXR: Nodular opacity in mid (L) apex and appears more prominent than prior study. Area that appeared masslike in LUL near apex on recent CXR not seen at this time.  Underlying emphysema present. No edema or consolidation   11/12/2013 Imaging   CT chest: At left lung apex, there is asymmetric pleural parenchymal scarring. No discrete lesion.  In superior segment of LLL, there are 1 dominant ground-glass opacity measuring 16 mm. 2 addt'l ground-glass opacities appreciated. F/U with CT in 3 months   02/11/2014 Imaging   CT chest: Several small bilat faint nodular densities, largest measures 1.3 cm over superior segment LLL. No new nodules, no adenopathy. Cannot exclude metastatic disease given pt's h/o melanoma. Recommend PET-CT   02/23/2014 PET scan   Persistent semi-solid nodule in superior segment LLL with low  metabolic activity. Moderately metabolic subcarinal & hilar LN are likely reactive.    05/25/2014 Imaging   CT chest: Persistent & similar size dominant LLL sub-solid nodule. Adenocarcinoma is considered. Biopsy or resection strongly considered. Additional scattered small ground-glass nodules stable.    06/02/2014 Imaging   MRI brain: Grossly stable appearance of left planum sphenoidale meningioma measuring 20 x 14 mm. Meningioma does not extend to left orbital apex & contacts left optic nerve. Stable right posterior clinoid meningioma .    09/27/2014 Imaging   CT chest: Further enlargment of central solid component in sub-solid LLL pulm nodule. Findings remain concerning for adenoca. Additional scattered small bilat ground-glass nodules and biapical scarring stable. No adenopathy.     11/25/2014 Surgery   Video bronchoscopy with endobronchial ultrasound; Video assisted thoracoscopy; Thoracoscopic LLL superior segmentectomy; Mediastinal lymph node dissection; Cyro intercostal nerve block Roxan Hockey)   11/25/2014 Pathology Results   Superior segment lung resection (LLL). Adenocarcinoma, well-diff, spanning 1.5 cm. Surgical margins negative. 5 LNs negative.    11/25/2014 Pathologic Stage   pT1a, pN0: Stage IA    11/25/2014 Initial Diagnosis   Primary cancer of left lower lobe of lung ([place])   05/31/2015 Imaging   CT chest: No evidence of local recurrent at superior segmentectomy in LLL. New small  nodular focus of consolidation surrounding thin parenchymal band. Additional sub-cm ground-glass and solid nodules favored to be benign. No thoracic adenopathy   08/11/2015 Imaging   CT chest: No evidence of residual or recurrent tumor in LLL. Stable small nodular focus in LLL with thin surrounding parenchymal band, favored to be benign.  Multiple small ground-glass nodules unchanged.    06/19/2017 Pathology Results   Diagnosis 1. Lung, biopsy, Left lower lobe - ADENOCARCINOMA. - SEE COMMENT. 2.  Lung, biopsy, Left lower lobe - BENIGN LUNG PARENCHYMA. - THERE IS NO EVIDENCE OF MALIGNANCY.   07/09/2017 - 07/22/2017 Radiation Therapy   Radiation treatment dates: 07/09/17-07/22/17 by Dr. Lisbeth Renshaw    Site/dose:  Left lung/ 50 Gy in 10 fractions   Beams/energy:  IMRT/ 6X   Narrative: The patient tolerated radiation treatment relatively well. She denied pain, fatigue, or difficulty swallowing. Skin to the treatment area appears warm, dry, and normal in color.   06/18/2019 Imaging   CT Chest  IMPRESSION: 1. There are 2 round solid nodules within the left lower lobe which have increased in size when compared with the previous exam, suspicious. Additionally, along the area of postsurgical change around the oblique fissure of the left lung there is progressive thickening with persistent loculated fluid. Cannot rule out local tumor recurrence along the suture line. 2. No significant change in the appearance of bilateral upper lobe sub solid lung nodules. 3. Aortic Atherosclerosis (ICD10-I70.0). Coronary artery calcifications.     07/07/2019 PET scan   IMPRESSION: 1. No evidence of hypermetabolic local tumor recurrence in the left lung. Low level nonfocal uptake along the segmentectomy site in the left lower lobe favors post treatment change. Continued chest CT surveillance warranted. 2. Two small solid left lower lobe pulmonary nodules, largest 0.7 cm, below PET resolution. Given the growth of these nodules since 10/03/2018 chest CT, metastatic disease remains on the differential. Close chest CT surveillance recommended. 3. Nonspecific hypermetabolism within nonenlarged subcarinal and bilateral hilar lymph nodes, not substantially changed since 03/04/2017 PET-CT study, favoring reactive uptake. 4. No hypermetabolic distant metastatic disease. 5. Chronic findings include: Aortic Atherosclerosis (ICD10-I70.0). Subcentimeter ground-glass pulmonary nodules in the upper lobes are unchanged  and are below PET resolution. Moderate sigmoid diverticulosis. Chronic right maxillary sinusitis.   04/13/2020 Imaging   CT Chest  IMPRESSION: 1. Primarily similar appearance of the lungs compared to 10/14/2019. Left sided surgical sutures with similar solid and subsolid pulmonary nodules as detailed above. A right middle lobe pleural based 5 mm nodule is new since the prior exam, warranting follow-up attention. 2. No thoracic adenopathy. 3. Aortic atherosclerosis (ICD10-I70.0), coronary artery atherosclerosis and emphysema (ICD10-J43.9).   10/10/2020 Imaging   CT chest  IMPRESSION: Postsurgical and post radiation changes in the lungs bilaterally, as above.   12 x 10 mm left lower lobe nodule, mildly progressive. Additional bilobed nodule in the posterior left lower lobe, also favored to be progressive. PET-CT is suggested for further evaluation.   Aortic Atherosclerosis (ICD10-I70.0) and Emphysema (ICD10-J43.9).   11/22/2020 - 12/02/2020 Radiation Therapy   Site Technique Total Dose (Gy) Dose per Fx (Gy) Completed Fx Beam Energies  Lung, Left: Lung_Lt_LLL IMRT 60/60 12 5/5 6XFFF        INTERVAL HISTORY:  Melissa Snyder is here for a follow up of lung and breast cancers. She was last seen by me on 10/27/20. She presents to the clinic alone. She reports she is recovering from radiation therapy. She reports she experienced a lot of fatigue that is  slowly improving and soreness to the radiation site. She notes she is back to normal now.   All other systems were reviewed with the patient and are negative.  MEDICAL HISTORY:  Past Medical History:  Diagnosis Date   Adenocarcinoma ([place])    recurrent/notes 07/03/2017   Blind left eye 10/2013   cva   Breast cancer ([place]) 05/2014   ER+/PR+/Her2-     LUNG CANCER   Bundle branch block left    Chest pain    Family history of bladder cancer    Family history of breast cancer    Family history of prostate cancer    GERD  (gastroesophageal reflux disease)    History of blood transfusion    age 9   Hyperlipidemia    Hypertension    Hypothyroid    Lung nodule    Melanoma in situ of face ([place]) JUNE 2015   LEFT CHEEK   Meningioma ([place])    brain lining   Migraine    reports only having one   Osteoarthritis    Personal history of radiation therapy    Radiation 09/16/14-10/07/14   Right  Breast  21 fractions   Skin cancer    Stroke ([place]) 2015   Blind in left eye as a result    SURGICAL HISTORY: Past Surgical History:  Procedure Laterality Date   ABDOMINAL HYSTERECTOMY  ~ 1997   APPENDECTOMY     age 77   BILATERAL SALPINGOOPHORECTOMY     BLADDER REPAIR  2009   cystocele   BRAIN MENINGIOMA EXCISION     Gamma knife to Meningioma in brain lining   BREAST BIOPSY     BREAST LUMPECTOMY Right 07/23/2014   invasive ductal   BUNIONECTOMY     bilateral great toe   CARDIAC CATHETERIZATION  2004   CHOLECYSTECTOMY  ~ [place] BLOCK Left 11/25/2014   Procedure: CRYO INTERCOSTAL NERVE BLOCK;  Surgeon: [name], MD;  Location: [name];  Service: Thoracic;  Laterality: Left;   CT RADIATION THERAPY GUIDE     Gamma radiation -lt frontal-[facility]   DILATION AND CURETTAGE OF UTERUS     X 2   HIP ARTHROPLASTY Left 07/04/2017   Procedure: ARTHROPLASTY BIPOLAR HIP (HEMIARTHROPLASTY);  Surgeon: Tania Ade, MD;  Location: [facility];  Service: Orthopedics;  Laterality: Left;   MELANOMA EXCISION Left 11/05/23, 11/30/13   CHEEK   SEGMENTECOMY Left 11/25/2014   Procedure: LEFT LOWER LOBE SEGMENTECTOMY;  Surgeon: [name], MD;  Location: [facility];  Service: Thoracic;  Laterality: Left;   VIDEO ASSISTED THORACOSCOPY Left 11/25/2014   Procedure: VIDEO ASSISTED THORACOSCOPY;  Surgeon: [name], MD;  Location: [facility];  Service: Thoracic;  Laterality: Left;   VIDEO BRONCHOSCOPY WITH ENDOBRONCHIAL NAVIGATION N/A 06/19/2017   Procedure: VIDEO BRONCHOSCOPY;  Surgeon:  [name], MD;  Location: [facility];  Service: Thoracic;  Laterality: N/A;   VIDEO BRONCHOSCOPY WITH ENDOBRONCHIAL ULTRASOUND N/A 11/25/2014   Procedure: VIDEO BRONCHOSCOPY WITH ENDOBRONCHIAL ULTRASOUND;  Surgeon: [name], MD;  Location: [facility];  Service: Thoracic;  Laterality: N/A;    I have reviewed the social history and family history with the patient and they are unchanged from previous note.  ALLERGIES:  is allergic to beef-derived products, other, chocolate, ibuprofen, niacin and related, and tramadol.  MEDICATIONS:  Current Outpatient Medications  Medication Sig Dispense Refill   aspirin 81 MG EC tablet  atenolol (TENORMIN) 50 MG tablet Take 50 mg by mouth at bedtime.      b complex vitamins capsule Take 1 capsule by mouth daily.     BINAXNOW COVID-19 AG HOME TEST KIT See admin instructions.     calcium carbonate (OSCAL) 1500 (600 Ca) MG TABS tablet      calcium-vitamin D (OSCAL WITH D) 500-200 MG-UNIT per tablet Take 1 tablet by mouth at bedtime.      Cholecalciferol (VITAMIN D3) 1000 units CAPS Take by mouth daily.     denosumab (PROLIA) 60 MG/ML SOSY injection      Digestive Enzymes (ENZYME DIGEST PO) Take 5 mLs by mouth 3 (three) times daily with meals.     fish oil-omega-3 fatty acids 1000 MG capsule Take 1 g by mouth daily.      FLUZONE HIGH-DOSE QUADRIVALENT 0.7 ML SUSY      levothyroxine (SYNTHROID, LEVOTHROID) 112 MCG tablet Take 112 mcg by mouth daily before breakfast.      lisinopril (PRINIVIL,ZESTRIL) 10 MG tablet Take 1 tablet (10 mg total) by mouth daily. (Patient taking differently: Take 10 mg by mouth 2 (two) times daily.) 30 tablet 1   meclizine (ANTIVERT) 12.5 MG tablet Take 12.5 mg by mouth 3 (three) times daily as needed for dizziness.     Multiple Vitamin (MULTIVITAMIN) tablet Take 1 tablet by mouth every morning.      Omega-3 1000 MG CAPS Take 1 g by mouth daily.     Polyvinyl Alcohol-Povidone (REFRESH OP) Apply 1 drop to eye as needed  (DRY EYES).      Triamcinolone Acetonide (NASACORT AQ NA) Place 1 spray into the nose at bedtime as needed (CONGESTION).     vitamin E 180 MG (400 UNITS) capsule Take 400 Units by mouth every Monday, Wednesday, and Friday. Mon Wed Fri     No current facility-administered medications for this visit.    PHYSICAL EXAMINATION: ECOG PERFORMANCE STATUS: 1 - Symptomatic but completely ambulatory  Vitals:   05/01/21 1321  BP: (!) 157/59  Pulse: 72  Resp: 18  Temp: 97.9 F (36.6 C)  SpO2: 98%   Wt Readings from Last 3 Encounters:  05/01/21 118 lb 14.4 oz (53.9 kg)  11/09/20 124 lb (56.2 kg)  10/27/20 123 lb 3.2 oz (55.9 kg)     GENERAL:alert, no distress and comfortable SKIN: skin color, texture, turgor are normal, no rashes or significant lesions EYES: normal, Conjunctiva are pink and non-injected, sclera clear  NECK: supple, thyroid normal size, non-tender, without nodularity LYMPH:  no palpable lymphadenopathy in the cervical, axillary  LUNGS: clear to auscultation and percussion with normal breathing effort HEART: regular rate & rhythm and no murmurs and no lower extremity edema ABDOMEN:abdomen soft, non-tender and normal bowel sounds Musculoskeletal:no cyanosis of digits and no clubbing  NEURO: alert & oriented x 3 with fluent speech, no focal motor/sensory deficits BREAST: No palpable mass, nodules or adenopathy bilaterally. Breast exam benign.   LABORATORY DATA:  I have reviewed the data as listed CBC Latest Ref Rng & Units 05/01/2021 10/27/2020 10/10/2020  WBC 4.0 - 10.5 K/uL 4.8 4.2 4.5  Hemoglobin 12.0 - 15.0 g/dL 12.3 12.0 12.4  Hematocrit 36.0 - 46.0 % 37.5 36.7 38.1  Platelets 150 - 400 K/uL 156 148(L) 163     CMP Latest Ref Rng & Units 05/01/2021 10/27/2020 10/10/2020  Glucose 70 - 99 mg/dL 115(H) 101(H) 86  BUN 8 - 23 mg/dL '17 18 16  ' Creatinine 0.44 - 1.00 mg/dL 0.74  0.76 0.77  Sodium 135 - 145 mmol/L 139 143 141  Potassium 3.5 - 5.1 mmol/L 4.0 3.8 4.2  Chloride  98 - 111 mmol/L 103 104 102  CO2 22 - 32 mmol/L '28 29 29  ' Calcium 8.9 - 10.3 mg/dL 9.1 9.3 9.4  Total Protein 6.5 - 8.1 g/dL 7.0 6.9 7.2  Total Bilirubin 0.3 - 1.2 mg/dL 0.6 0.7 0.6  Alkaline Phos 38 - 126 U/L 116 89 91  AST 15 - 41 U/L '22 22 23  ' ALT 0 - 44 U/L '17 18 17      ' RADIOGRAPHIC STUDIES: I have personally reviewed the radiological images as listed and agreed with the findings in the report. No results found.    Orders Placed This Encounter  Procedures   CT CHEST WO CONTRAST    Standing Status:   Future    Standing Expiration Date:   05/01/2022    Order Specific Question:   Preferred imaging location?    Answer:   [facility]    Order Specific Question:   Release to patient    Answer:   Immediate   MM Digital Screening    Standing Status:   Future    Standing Expiration Date:   05/01/2022    Order Specific Question:   Reason for Exam (SYMPTOM  OR DIAGNOSIS REQUIRED)    Answer:   screening    Order Specific Question:   Preferred imaging location?    Answer:   [facility]   All questions were answered. The patient knows to call the clinic with any problems, questions or concerns. No barriers to learning was detected. The total time spent in the appointment was 30 minutes.     [name], MD 05/01/2021   I, Wilburn Mylar, am acting as scribe for [name], MD.   I have reviewed the above documentation for accuracy and completeness, and I agree with the above.

## 2021-05-03 DIAGNOSIS — M810 Age-related osteoporosis without current pathological fracture: Secondary | ICD-10-CM | POA: Diagnosis not present

## 2021-05-03 DIAGNOSIS — E785 Hyperlipidemia, unspecified: Secondary | ICD-10-CM | POA: Diagnosis not present

## 2021-05-03 DIAGNOSIS — M1990 Unspecified osteoarthritis, unspecified site: Secondary | ICD-10-CM | POA: Diagnosis not present

## 2021-05-03 DIAGNOSIS — I10 Essential (primary) hypertension: Secondary | ICD-10-CM | POA: Diagnosis not present

## 2021-05-12 ENCOUNTER — Other Ambulatory Visit (HOSPITAL_COMMUNITY): Payer: Self-pay | Admitting: *Deleted

## 2021-05-15 ENCOUNTER — Other Ambulatory Visit: Payer: Self-pay

## 2021-05-15 ENCOUNTER — Ambulatory Visit (HOSPITAL_COMMUNITY)
Admission: RE | Admit: 2021-05-15 | Discharge: 2021-05-15 | Disposition: A | Discharge status: Home / Self Care | Payer: Medicare HMO | Source: Ambulatory Visit | Attending: Internal Medicine | Admitting: Internal Medicine

## 2021-05-15 DIAGNOSIS — M810 Age-related osteoporosis without current pathological fracture: Secondary | ICD-10-CM | POA: Insufficient documentation

## 2021-05-15 MED ORDER — DENOSUMAB 60 MG/ML ~~LOC~~ SOSY: 60.0000 mg | PREFILLED_SYRINGE | Freq: Once | SUBCUTANEOUS | Status: AC

## 2021-05-15 MED ORDER — DENOSUMAB 60 MG/ML ~~LOC~~ SOSY
PREFILLED_SYRINGE | SUBCUTANEOUS | Status: AC
  Administered 2021-05-15: 60 mg via SUBCUTANEOUS
  Filled 2021-05-15: qty 1

## 2021-05-22 ENCOUNTER — Encounter (HOSPITAL_COMMUNITY): Payer: Self-pay

## 2021-05-22 ENCOUNTER — Ambulatory Visit (HOSPITAL_COMMUNITY)
Admission: RE | Admit: 2021-05-22 | Discharge: 2021-05-22 | Disposition: A | Discharge status: Home / Self Care | Payer: Medicare HMO | Source: Ambulatory Visit | Attending: Hematology | Admitting: Hematology

## 2021-05-22 ENCOUNTER — Other Ambulatory Visit: Payer: Self-pay

## 2021-05-22 DIAGNOSIS — J90 Pleural effusion, not elsewhere classified: Secondary | ICD-10-CM | POA: Diagnosis not present

## 2021-05-22 DIAGNOSIS — C50411 Malignant neoplasm of upper-outer quadrant of right female breast: Secondary | ICD-10-CM | POA: Diagnosis not present

## 2021-05-22 DIAGNOSIS — Z170 Estrogen receptor positive status [ER+]: Secondary | ICD-10-CM | POA: Diagnosis not present

## 2021-05-22 DIAGNOSIS — C3490 Malignant neoplasm of unspecified part of unspecified bronchus or lung: Secondary | ICD-10-CM | POA: Diagnosis not present

## 2021-05-22 DIAGNOSIS — R911 Solitary pulmonary nodule: Secondary | ICD-10-CM | POA: Diagnosis not present

## 2021-05-22 DIAGNOSIS — I700 Atherosclerosis of aorta: Secondary | ICD-10-CM | POA: Diagnosis not present

## 2021-05-22 DIAGNOSIS — J479 Bronchiectasis, uncomplicated: Secondary | ICD-10-CM | POA: Diagnosis not present

## 2021-05-23 ENCOUNTER — Telehealth: Payer: Self-pay

## 2021-05-23 ENCOUNTER — Inpatient Hospital Stay: Payer: Medicare HMO | Attending: Hematology | Admitting: Hematology

## 2021-05-23 DIAGNOSIS — Z170 Estrogen receptor positive status [ER+]: Secondary | ICD-10-CM

## 2021-05-23 DIAGNOSIS — C3432 Malignant neoplasm of lower lobe, left bronchus or lung: Secondary | ICD-10-CM | POA: Diagnosis not present

## 2021-05-23 DIAGNOSIS — D329 Benign neoplasm of meninges, unspecified: Secondary | ICD-10-CM

## 2021-05-23 DIAGNOSIS — C50411 Malignant neoplasm of upper-outer quadrant of right female breast: Secondary | ICD-10-CM

## 2021-05-23 NOTE — Progress Notes (Signed)
[facility]   [phone] Fax:(336) [phone]   Clinic Follow up Note   Patient Care Team: [name], MD as PCP - General (Internal Medicine) [name], MD as Consulting Physician (Cardiothoracic Surgery) [name], MD as Consulting Physician (Urology) Melissa Merle, MD as Consulting Physician (Hematology) [name], MD as Consulting Physician (Radiation Oncology) Hayden Pedro, PA-C as Physician Assistant (Radiation Oncology)  Date of Service:  05/23/2021  I connected with Melissa Snyder on 05/23/2021 at  3:50 PM EST by telephone visit and verified that I am speaking with the correct person using two identifiers.  I discussed the limitations, risks, security and privacy concerns of performing an evaluation and management service by telephone and the availability of in person appointments. I also discussed with the patient that there may be a patient responsible charge related to this service. The patient expressed understanding and agreed to proceed.   Other persons participating in the visit and their role in the encounter:  none  Patient's location:  home Provider's location:  my office  CHIEF COMPLAINT: f/u of lung and breast cancer  CURRENT THERAPY:  Surveillance  ASSESSMENT & PLAN:  Melissa Snyder is a 87 y.o. female with   1. Stage I left lung adenocarcinoma, pT1aN0M0 in 2016, recurrent LLL lung adenocarcinoma in 06/2017, new lung nodules  -initially diagnosed in 11/2014. Treated with left lower lobe segmentectomy by Dr. Roxan Hockey, pathology showing stage I adenocarcinoma -She had cancer recurrence in LLL in 06/2017. She was treated with radiation in 07/2017 with Dr. Lisbeth Renshaw.  -She was seen to have 2 left lung nodules on 06/18/19 CT Chest  -CT chest from 10/10/20 and PET from 10/27/20 showed 2 enlarging nodules in the left upper lobe, measuring 1.2 and 1.3 cm, with SUV 1.3-2.45, slightly increased from PET scan in 07/2019.   These are concerning for malignancy.  -she received 5 fractions SBRT under Dr. Lisbeth Renshaw 11/2020.  -post-treatment chest CT on 05/22/21 showed radiation therapy changes in left lung and resolution of RML nodularity.No new lesions or evidence of cancer progression. I reviewed the results with her today.   2. Right breast invasive ductal carcinoma, pT1b N0 M0 stage IA, ER positive PR positive and HER-2 negative. -She was diagnosed in 05/2014. She is s/p right lumpectomy and adjuvant radiation.  -I recommended antiestrogens in 2016 and she declined at that time due to cost and concern with side effects. Will continue with breast cancer surveillance.  -She is clinically stable from a breast standpoint. There is no clinical concern for recurrence. -Continue Surveillance. Her 08/2020 Mammogram showed benign left breast calcifications.    3. Benign meningiomas -s/p gamma knife treatment in 02/2014 -followed by Dr. Salomon Fick -last brain MRI 06/04/19 was stable.     Plan: -mammogram 08/2021 -f/u in 4 months with lab and CT chest a few days before   No problem-specific Assessment & Plan notes found for this encounter.    SUMMARY OF ONCOLOGIC HISTORY: Oncology History Overview Note   Cancer Staging  Breast cancer of upper-outer quadrant of right female breast [facility]) Staging form: Breast, AJCC 7th Edition - Clinical stage from 05/19/2014: Stage IA (T1b, N0, M0) - Signed by Holley Bouche, NP on 11/17/2015 - Pathologic stage from 10/17/2014: Stage IA (T1b, N0, cM0) - Signed by [name], MD on 10/17/2014  Primary cancer of left lower lobe of lung [facility]) Staging form: Lung, AJCC 7th Edition - Clinical stage from 12/14/2014: Stage IA (T1a, N0, M0) -  Unsigned    Breast cancer of upper-outer quadrant of right female breast ([place])  05/04/2014 Initial Diagnosis   Breast cancer   05/19/2014 Imaging   Diagnostic mammogram and ultrasound showed a 7 mm irregular suspicious mass located in the right  breast at the 10:00 position, 7 cm from the nipple. Otherwise negative.   05/19/2014 Initial Biopsy   Right breast biopsy (UOQ): IDC, grade 2.    05/19/2014 Receptors her2   ER 100% positive, PR 35% positive, HER-2 negative, Ki67 8%   06/07/2014 Miscellaneous   Genetic testing normal.  APC, ATM, BARD1, BMPR1A, BRCA1, BRCA2, BRIP1, CDH1, CDK4, CDKN2A, CHEK2, EPCAM, GREM1, MLH1, MRE11A, MSH2, MSH6, MUTYH, NBN, NF1, PALB2, PMS2, POLD1, POLE, PTEN, RAD50, RAD51D, SMAD4, SMARCA4, STK11, and TP53 tested.    07/23/2014 Surgery   Right breast lumpectomy with SLNB Lucia Gaskins) showed invasive ductal carcinoma & DCIS; negative surgical margins.   07/23/2014 Pathology Results   Invasive ductal carcinoma, tumor size 0.8 cm, grade 1, no lymphovascular invasion, margins were negative, 2 sentinel lymph nodes were negative. (+) DCIS.   07/23/2014 Pathologic Stage   pT1b, pN0: Stage IA    09/16/2014 - 10/07/2014 Radiation Therapy   XRT completed Pablo Ledger). Right breast/ 42.72 Gy at 2.67 Gy per fraction x 21 fractions.     Anti-estrogen oral therapy   Aromasin prescribed in 01/2015 but Patient declines anti-estrogen therapy at this time d/t high co-pay of medication and possible side effects of AI therapy.    05/02/2015 Mammogram   Diagnostic TOMO bilat mammogram: No evidence of malignancy in either breast. Lumpectomy changes in right breast. Diagnostic mammo suggested in 1 year.    Primary cancer of left lower lobe of lung ([place])  10/25/2013 Imaging   CXR done for stroke protocol. No acute chest findings. Chronic lung disease with biapical scarring. Developing nodule in (L) apex cannot be excluded based on exam, athough may be d/t overlap of osseous structures.    10/27/2013 Imaging   CXR: Nodular opacity in mid (L) apex and appears more prominent than prior study. Area that appeared masslike in LUL near apex on recent CXR not seen at this time.  Underlying emphysema present. No edema or consolidation    11/12/2013 Imaging   CT chest: At left lung apex, there is asymmetric pleural parenchymal scarring. No discrete lesion.  In superior segment of LLL, there are 1 dominant ground-glass opacity measuring 16 mm. 2 addt'l ground-glass opacities appreciated. F/U with CT in 3 months   02/11/2014 Imaging   CT chest: Several small bilat faint nodular densities, largest measures 1.3 cm over superior segment LLL. No new nodules, no adenopathy. Cannot exclude metastatic disease given pt's h/o melanoma. Recommend PET-CT   02/23/2014 PET scan   Persistent semi-solid nodule in superior segment LLL with low metabolic activity. Moderately metabolic subcarinal & hilar LN are likely reactive.    05/25/2014 Imaging   CT chest: Persistent & similar size dominant LLL sub-solid nodule. Adenocarcinoma is considered. Biopsy or resection strongly considered. Additional scattered small ground-glass nodules stable.    06/02/2014 Imaging   MRI brain: Grossly stable appearance of left planum sphenoidale meningioma measuring 20 x 14 mm. Meningioma does not extend to left orbital apex & contacts left optic nerve. Stable right posterior clinoid meningioma .    09/27/2014 Imaging   CT chest: Further enlargment of central solid component in sub-solid LLL pulm nodule. Findings remain concerning for adenoca. Additional scattered small bilat ground-glass nodules and biapical scarring stable. No adenopathy.  11/25/2014 Surgery   Video bronchoscopy with endobronchial ultrasound; Video assisted thoracoscopy; Thoracoscopic LLL superior segmentectomy; Mediastinal lymph node dissection; Cyro intercostal nerve block Roxan Hockey)   11/25/2014 Pathology Results   Superior segment lung resection (LLL). Adenocarcinoma, well-diff, spanning 1.5 cm. Surgical margins negative. 5 LNs negative.    11/25/2014 Pathologic Stage   pT1a, pN0: Stage IA    11/25/2014 Initial Diagnosis   Primary cancer of left lower lobe of lung ([place])   05/31/2015  Imaging   CT chest: No evidence of local recurrent at superior segmentectomy in LLL. New small nodular focus of consolidation surrounding thin parenchymal band. Additional sub-cm ground-glass and solid nodules favored to be benign. No thoracic adenopathy   08/11/2015 Imaging   CT chest: No evidence of residual or recurrent tumor in LLL. Stable small nodular focus in LLL with thin surrounding parenchymal band, favored to be benign.  Multiple small ground-glass nodules unchanged.    06/19/2017 Pathology Results   Diagnosis 1. Lung, biopsy, Left lower lobe - ADENOCARCINOMA. - SEE COMMENT. 2. Lung, biopsy, Left lower lobe - BENIGN LUNG PARENCHYMA. - THERE IS NO EVIDENCE OF MALIGNANCY.   07/09/2017 - 07/22/2017 Radiation Therapy   Radiation treatment dates: 07/09/17-07/22/17 by Dr. Lisbeth Renshaw    Site/dose:  Left lung/ 50 Gy in 10 fractions   Beams/energy:  IMRT/ 6X   Narrative: The patient tolerated radiation treatment relatively well. She denied pain, fatigue, or difficulty swallowing. Skin to the treatment area appears warm, dry, and normal in color.   06/18/2019 Imaging   CT Chest  IMPRESSION: 1. There are 2 round solid nodules within the left lower lobe which have increased in size when compared with the previous exam, suspicious. Additionally, along the area of postsurgical change around the oblique fissure of the left lung there is progressive thickening with persistent loculated fluid. Cannot rule out local tumor recurrence along the suture line. 2. No significant change in the appearance of bilateral upper lobe sub solid lung nodules. 3. Aortic Atherosclerosis (ICD10-I70.0). Coronary artery calcifications.     07/07/2019 PET scan   IMPRESSION: 1. No evidence of hypermetabolic local tumor recurrence in the left lung. Low level nonfocal uptake along the segmentectomy site in the left lower lobe favors post treatment change. Continued chest CT surveillance warranted. 2. Two small solid  left lower lobe pulmonary nodules, largest 0.7 cm, below PET resolution. Given the growth of these nodules since 10/03/2018 chest CT, metastatic disease remains on the differential. Close chest CT surveillance recommended. 3. Nonspecific hypermetabolism within nonenlarged subcarinal and bilateral hilar lymph nodes, not substantially changed since 03/04/2017 PET-CT study, favoring reactive uptake. 4. No hypermetabolic distant metastatic disease. 5. Chronic findings include: Aortic Atherosclerosis (ICD10-I70.0). Subcentimeter ground-glass pulmonary nodules in the upper lobes are unchanged and are below PET resolution. Moderate sigmoid diverticulosis. Chronic right maxillary sinusitis.   04/13/2020 Imaging   CT Chest  IMPRESSION: 1. Primarily similar appearance of the lungs compared to 10/14/2019. Left sided surgical sutures with similar solid and subsolid pulmonary nodules as detailed above. A right middle lobe pleural based 5 mm nodule is new since the prior exam, warranting follow-up attention. 2. No thoracic adenopathy. 3. Aortic atherosclerosis (ICD10-I70.0), coronary artery atherosclerosis and emphysema (ICD10-J43.9).   10/10/2020 Imaging   CT chest  IMPRESSION: Postsurgical and post radiation changes in the lungs bilaterally, as above.   12 x 10 mm left lower lobe nodule, mildly progressive. Additional bilobed nodule in the posterior left lower lobe, also favored to be progressive. PET-CT is suggested for  further evaluation.   Aortic Atherosclerosis (ICD10-I70.0) and Emphysema (ICD10-J43.9).   11/22/2020 - 12/02/2020 Radiation Therapy   Site Technique Total Dose (Gy) Dose per Fx (Gy) Completed Fx Beam Energies  Lung, Left: Lung_Lt_LLL IMRT 60/60 12 5/5 6XFFF     05/22/2021 Imaging   EXAM: CT CHEST WITHOUT CONTRAST  IMPRESSION: 1. Interval development of a new area of volume loss, ground-glass opacity, and bronchiectasis in the posterior left lower lobe. Imaging  features are compatible with radiation therapy. 2. The posterior left lung nodules, including index 12 x 10 mm nodule described previously have become incorporated into the post treatment change on the current study. 3. Interval resolution of the posterior right middle lobe nodularity seen previously. 4. Other scattered areas of consolidative opacity or similar. 5. Trace left pleural effusion. 6. Aortic Atherosclerosis (ICD10-I70.0).      INTERVAL HISTORY:  Melissa Snyder was contacted for a follow up of lung and breast cancer. She was last seen by me on 05/01/21. She reports she hasn't fully recovered from SBRT yet.    All other systems were reviewed with the patient and are negative.  MEDICAL HISTORY:  Past Medical History:  Diagnosis Date   Adenocarcinoma ([place])    recurrent/notes 07/03/2017   Blind left eye 10/2013   cva   Breast cancer ([place]) 05/2014   ER+/PR+/Her2-     LUNG CANCER   Bundle branch block left    Chest pain    Family history of bladder cancer    Family history of breast cancer    Family history of prostate cancer    GERD (gastroesophageal reflux disease)    History of blood transfusion    age 60   Hyperlipidemia    Hypertension    Hypothyroid    Lung nodule    Melanoma in situ of face ([place]) JUNE 2015   LEFT CHEEK   Meningioma ([place])    brain lining   Migraine    reports only having one   Osteoarthritis    Personal history of radiation therapy    Radiation 09/16/14-10/07/14   Right  Breast  21 fractions   Skin cancer    Stroke ([place]) 2015   Blind in left eye as a result    SURGICAL HISTORY: Past Surgical History:  Procedure Laterality Date   ABDOMINAL HYSTERECTOMY  ~ 1997   APPENDECTOMY     age 40   BILATERAL SALPINGOOPHORECTOMY     BLADDER REPAIR  2009   cystocele   BRAIN MENINGIOMA EXCISION     Gamma knife to Meningioma in brain lining   BREAST BIOPSY     BREAST LUMPECTOMY Right 07/23/2014   invasive ductal   BUNIONECTOMY     bilateral  great toe   CARDIAC CATHETERIZATION  2004   CHOLECYSTECTOMY  ~ [place] BLOCK Left 11/25/2014   Procedure: CRYO INTERCOSTAL NERVE BLOCK;  Surgeon: [name], MD;  Location: [place];  Service: Thoracic;  Laterality: Left;   CT RADIATION THERAPY GUIDE     Gamma radiation -lt frontal-[facility]   DILATION AND CURETTAGE OF UTERUS     X 2   HIP ARTHROPLASTY Left 07/04/2017   Procedure: ARTHROPLASTY BIPOLAR HIP (HEMIARTHROPLASTY);  Surgeon: Tania Ade, MD;  Location: [place];  Service: Orthopedics;  Laterality: Left;   MELANOMA EXCISION Left 11/05/23, 11/30/13   CHEEK   SEGMENTECOMY Left 11/25/2014   Procedure: LEFT LOWER LOBE SEGMENTECTOMY;  Surgeon: [name],  MD;  Location: [place];  Service: Thoracic;  Laterality: Left;   VIDEO ASSISTED THORACOSCOPY Left 11/25/2014   Procedure: VIDEO ASSISTED THORACOSCOPY;  Surgeon: [name], MD;  Location: [place];  Service: Thoracic;  Laterality: Left;   VIDEO BRONCHOSCOPY WITH ENDOBRONCHIAL NAVIGATION N/A 06/19/2017   Procedure: VIDEO BRONCHOSCOPY;  Surgeon: [name], MD;  Location: [place];  Service: Thoracic;  Laterality: N/A;   VIDEO BRONCHOSCOPY WITH ENDOBRONCHIAL ULTRASOUND N/A 11/25/2014   Procedure: VIDEO BRONCHOSCOPY WITH ENDOBRONCHIAL ULTRASOUND;  Surgeon: [name], MD;  Location: [place];  Service: Thoracic;  Laterality: N/A;    I have reviewed the social history and family history with the patient and they are unchanged from previous note.  ALLERGIES:  is allergic to beef-derived products, other, chocolate, ibuprofen, niacin and related, and tramadol.  MEDICATIONS:  Current Outpatient Medications  Medication Sig Dispense Refill   aspirin 81 MG EC tablet      atenolol (TENORMIN) 50 MG tablet Take 50 mg by mouth at bedtime.      b complex vitamins capsule Take 1 capsule by mouth daily.     BINAXNOW COVID-19 AG HOME TEST KIT See admin instructions.     calcium  carbonate (OSCAL) 1500 (600 Ca) MG TABS tablet      calcium-vitamin D (OSCAL WITH D) 500-200 MG-UNIT per tablet Take 1 tablet by mouth at bedtime.      Cholecalciferol (VITAMIN D3) 1000 units CAPS Take by mouth daily.     denosumab (PROLIA) 60 MG/ML SOSY injection      Digestive Enzymes (ENZYME DIGEST PO) Take 5 mLs by mouth 3 (three) times daily with meals.     fish oil-omega-3 fatty acids 1000 MG capsule Take 1 g by mouth daily.      FLUZONE HIGH-DOSE QUADRIVALENT 0.7 ML SUSY      levothyroxine (SYNTHROID, LEVOTHROID) 112 MCG tablet Take 112 mcg by mouth daily before breakfast.      lisinopril (PRINIVIL,ZESTRIL) 10 MG tablet Take 1 tablet (10 mg total) by mouth daily. (Patient taking differently: Take 10 mg by mouth 2 (two) times daily.) 30 tablet 1   meclizine (ANTIVERT) 12.5 MG tablet Take 12.5 mg by mouth 3 (three) times daily as needed for dizziness.     Multiple Vitamin (MULTIVITAMIN) tablet Take 1 tablet by mouth every morning.      Omega-3 1000 MG CAPS Take 1 g by mouth daily.     Polyvinyl Alcohol-Povidone (REFRESH OP) Apply 1 drop to eye as needed (DRY EYES).      Triamcinolone Acetonide (NASACORT AQ NA) Place 1 spray into the nose at bedtime as needed (CONGESTION).     vitamin E 180 MG (400 UNITS) capsule Take 400 Units by mouth every Monday, Wednesday, and Friday. Mon Wed Fri     No current facility-administered medications for this visit.    PHYSICAL EXAMINATION: ECOG PERFORMANCE STATUS: 0 - Asymptomatic  There were no vitals filed for this visit. Wt Readings from Last 3 Encounters:  05/01/21 118 lb 14.4 oz (53.9 kg)  11/09/20 124 lb (56.2 kg)  10/27/20 123 lb 3.2 oz (55.9 kg)     No vitals taken today, Exam not performed today  LABORATORY DATA:  I have reviewed the data as listed CBC Latest Ref Rng & Units 05/01/2021 10/27/2020 10/10/2020  WBC 4.0 - 10.5 K/uL 4.8 4.2 4.5  Hemoglobin 12.0 - 15.0 g/dL 12.3 12.0 12.4  Hematocrit 36.0 - 46.0 % 37.5 36.7 38.1  Platelets  150 -  400 K/uL 156 148(L) 163     CMP Latest Ref Rng & Units 05/01/2021 10/27/2020 10/10/2020  Glucose 70 - 99 mg/dL 115(H) 101(H) 86  BUN 8 - 23 mg/dL '17 18 16  ' Creatinine 0.44 - 1.00 mg/dL 0.74 0.76 0.77  Sodium 135 - 145 mmol/L 139 143 141  Potassium 3.5 - 5.1 mmol/L 4.0 3.8 4.2  Chloride 98 - 111 mmol/L 103 104 102  CO2 22 - 32 mmol/L '28 29 29  ' Calcium 8.9 - 10.3 mg/dL 9.1 9.3 9.4  Total Protein 6.5 - 8.1 g/dL 7.0 6.9 7.2  Total Bilirubin 0.3 - 1.2 mg/dL 0.6 0.7 0.6  Alkaline Phos 38 - 126 U/L 116 89 91  AST 15 - 41 U/L '22 22 23  ' ALT 0 - 44 U/L '17 18 17      ' RADIOGRAPHIC STUDIES: I have personally reviewed the radiological images as listed and agreed with the findings in the report. CT CHEST WO CONTRAST  Result Date: 05/23/2021 CLINICAL DATA:  Small-cell lung cancer.  Restaging. EXAM: CT CHEST WITHOUT CONTRAST TECHNIQUE: Multidetector CT imaging of the chest was performed following the standard protocol without IV contrast. COMPARISON:  PET-CT 10/25/2020.  Chest CT 10/10/2020 FINDINGS: Cardiovascular: The heart size is normal. No substantial pericardial effusion. Coronary artery calcification is evident. Mitral annular calcification evident. Moderate atherosclerotic calcification is noted in the wall of the thoracic aorta. Mediastinum/Nodes: No mediastinal lymphadenopathy. No evidence for gross hilar lymphadenopathy although assessment is limited by the lack of intravenous contrast on the current study. The esophagus has normal imaging features. There is no axillary lymphadenopathy. Lungs/Pleura: Stable pleuroparenchymal scarring in the right lung apex. Subpleural reticulation anterior right lung is stable. Bronchiectasis and scarring in the right middle lobe is unchanged in the interval. Nodularity described in the posterior right middle lobe previously has resolved in the interval. 1.4 x 1.0 cm posterior left lower lobe nodule described previously has become incorporated into a new area  of volume loss, ground-glass opacity common interstitial coarsening with of all vein bronchiectasis, features suggesting radiation therapy. The 1.2 x 1.0 cm posterior left lung nodule is no longer discretely evident, having been incorporated into the area of treatment change. Staple line with associated soft tissue density in the left lung is similar to prior. Trace left pleural effusion evident. Upper Abdomen: Unremarkable. Musculoskeletal: Heterogeneous mineralization of bony anatomy noted diffusely, stable. No worrisome lytic or sclerotic osseous abnormality. IMPRESSION: 1. Interval development of a new area of volume loss, ground-glass opacity, and bronchiectasis in the posterior left lower lobe. Imaging features are compatible with radiation therapy. 2. The posterior left lung nodules, including index 12 x 10 mm nodule described previously have become incorporated into the post treatment change on the current study. 3. Interval resolution of the posterior right middle lobe nodularity seen previously. 4. Other scattered areas of consolidative opacity or similar. 5. Trace left pleural effusion. 6. Aortic Atherosclerosis (ICD10-I70.0). Electronically Signed   By: Misty Stanley M.D.   On: 05/23/2021 06:01      Orders Placed This Encounter  Procedures   CT Chest Wo Contrast    Standing Status:   Future    Standing Expiration Date:   05/23/2022    Order Specific Question:   Preferred imaging location?    Answer:   [facility]   All questions were answered. The patient knows to call the clinic with any problems, questions or concerns. No barriers to learning was detected. The total time spent in the appointment  was 22 minutes.     Aurea Graff 05/23/2021   I, Wilburn Mylar, am acting as scribe for Melissa Merle, MD.   I have reviewed the above documentation for accuracy and completeness, and I agree with the above.

## 2021-05-23 NOTE — Telephone Encounter (Signed)
LVM stating Dr. Burr Medico would like to have a telephone or virtual visit with the pt today or tomorrow 05/24/2021.  LVM on both the pt's home and cell phone numbers.  Asked pt to return telephone call stating which day and time will work best for her.  Awaiting pt's return call.

## 2021-05-24 ENCOUNTER — Telehealth: Payer: Self-pay | Admitting: Hematology

## 2021-05-24 NOTE — Telephone Encounter (Signed)
Scheduled follow-up appointments per 12/20 los. Patient is aware.

## 2021-06-09 DIAGNOSIS — M62830 Muscle spasm of back: Secondary | ICD-10-CM | POA: Diagnosis not present

## 2021-06-09 DIAGNOSIS — M545 Low back pain, unspecified: Secondary | ICD-10-CM | POA: Diagnosis not present

## 2021-06-09 DIAGNOSIS — S39012A Strain of muscle, fascia and tendon of lower back, initial encounter: Secondary | ICD-10-CM | POA: Diagnosis not present

## 2021-06-22 ENCOUNTER — Other Ambulatory Visit: Payer: Self-pay

## 2021-06-22 ENCOUNTER — Ambulatory Visit (HOSPITAL_COMMUNITY)
Admission: EM | Admit: 2021-06-22 | Discharge: 2021-06-22 | Disposition: A | Discharge status: Home / Self Care | Payer: Medicare HMO | Attending: Family Medicine | Admitting: Family Medicine

## 2021-06-22 ENCOUNTER — Encounter (HOSPITAL_COMMUNITY): Payer: Self-pay

## 2021-06-22 DIAGNOSIS — I10 Essential (primary) hypertension: Secondary | ICD-10-CM | POA: Diagnosis not present

## 2021-06-22 DIAGNOSIS — N3090 Cystitis, unspecified without hematuria: Secondary | ICD-10-CM | POA: Insufficient documentation

## 2021-06-22 LAB — POCT URINALYSIS DIPSTICK, ED / UC
Bilirubin Urine: NEGATIVE
Glucose, UA: NEGATIVE mg/dL
Ketones, ur: NEGATIVE mg/dL
Nitrite: NEGATIVE
Protein, ur: NEGATIVE mg/dL
Specific Gravity, Urine: 1.01 (ref 1.005–1.030)
Urobilinogen, UA: 0.2 mg/dL (ref 0.0–1.0)
pH: 6.5 (ref 5.0–8.0)

## 2021-06-22 MED ORDER — CEPHALEXIN 500 MG PO CAPS: 500.0000 mg | ORAL_CAPSULE | Freq: Two times a day (BID) | ORAL | 0 refills | Status: DC

## 2021-06-22 NOTE — ED Triage Notes (Signed)
Pt presents with c/o possible UTI.   States she has lower abdomen pain.

## 2021-06-22 NOTE — Discharge Instructions (Signed)
You have had labs (urine culture) sent today. We will call you with any significant abnormalities or if there is need to begin or change treatment or pursue further follow up.  You may also review your test results online through [facility]. If you do not have a MyChart account, instructions to sign up should be on your discharge paperwork.  Your blood pressure was noted to be elevated during your visit today. If you are currently taking medication for high blood pressure, please ensure you are taking this as directed. If you do not have a history of high blood pressure and your blood pressure remains persistently elevated, you may need to begin taking a medication at some point. You may return here within the next few days to recheck if unable to see your primary care provider or if you do not have a one.  BP (!) 171/70 (BP Location: Left Arm)    Pulse 69    Temp 98.3 F (36.8 C) (Oral)    Resp 15    SpO2 95%   BP Readings from Last 3 Encounters:  06/22/21 (!) 171/70  05/15/21 (!) 179/79  05/01/21 (!) 157/59

## 2021-06-22 NOTE — ED Provider Notes (Signed)
[place]    ASSESSMENT & PLAN:  1. Cystitis   2. Elevated blood pressure reading in office with diagnosis of hypertension      Discharge Instructions      You have had labs (urine culture) sent today. We will call you with any significant abnormalities or if there is need to begin or change treatment or pursue further follow up.  You may also review your test results online through [facility]. If you do not have a MyChart account, instructions to sign up should be on your discharge paperwork.  Your blood pressure was noted to be elevated during your visit today. If you are currently taking medication for high blood pressure, please ensure you are taking this as directed. If you do not have a history of high blood pressure and your blood pressure remains persistently elevated, you may need to begin taking a medication at some point. You may return here within the next few days to recheck if unable to see your primary care provider or if you do not have a one.  BP (!) 171/70 (BP Location: Left Arm)    Pulse 69    Temp 98.3 F (36.8 C) (Oral)    Resp 15    SpO2 95%   BP Readings from Last 3 Encounters:  06/22/21 (!) 171/70  05/15/21 (!) 179/79  05/01/21 (!) 157/59       Labs Reviewed  POCT URINALYSIS DIPSTICK, ED / UC - Abnormal; Notable for the following components:      Result Value   Hgb urine dipstick SMALL (*)    Leukocytes,Ua TRACE (*)    All other components within normal limits  URINE CULTURE   Begin: Meds ordered this encounter  Medications   cephALEXin (KEFLEX) 500 MG capsule    Sig: Take 1 capsule (500 mg total) by mouth 2 (two) times daily.    Dispense:  10 capsule    Refill:  0   No signs of pyelonephritis. Urine culture sent. Will follow up with her PCP or here if not showing improvement over the next 48 hours, sooner if needed.  Outlined signs and symptoms indicating need for more acute intervention. Patient verbalized understanding. After  Visit Summary given.  SUBJECTIVE:  Melissa Snyder is a 87 y.o. female who complains of mild lower midline abd discomfort  for the past 24 hours; questions UTI. Without associated flank pain, fever, chills, vaginal discharge or bleeding. Gross hematuria: not present. No specific aggravating or alleviating factors reported. No LE edema. Normal PO intake without n/v/d. Without specific abdominal pain. Ambulatory without difficulty. No tx PTA.[email or web address]  LMP: No LMP recorded. Patient has had a hysterectomy.  Increased blood pressure noted today. Reports that she is treated for HTN. She reports taking medications as instructed, no chest pain on exertion, no dyspnea on exertion, no orthostatic dizziness or lightheadedness, no orthopnea or paroxysmal nocturnal dyspnea, and no palpitations.   OBJECTIVE:  Vitals:   06/22/21 1250  BP: (!) 171/70  Pulse: 69  Resp: 15  Temp: 98.3 F (36.8 C)  TempSrc: Oral  SpO2: 95%   General appearance: alert; no distress Lungs: unlabored respirations Abdomen: soft, non-tender Back: no CVA tenderness Extremities: no edema; symmetrical with no gross deformities Skin: warm and dry Neurologic: normal gait Psychological: alert and cooperative; normal mood and affect  Labs Reviewed  POCT URINALYSIS DIPSTICK, ED / UC - Abnormal; Notable for the following components:      Result Value   Hgb urine  dipstick SMALL (*)    Leukocytes,Ua TRACE (*)    All other components within normal limits  URINE CULTURE    Allergies  Allergen Reactions   Beef-Derived Products Other (See Comments)    ACID REFLUX   Other     FRIED FOODS UNSPECIFIED REACTION    Chocolate Other (See Comments)    Acid Reflux   Ibuprofen Rash and Other (See Comments)    Causes mouth sores   Niacin And Related Other (See Comments)    Flushing, burning, redness > REACTIONS NOT ALLERGIC    Tramadol Itching    Past Medical History:  Diagnosis Date   Adenocarcinoma ([place])    recurrent/notes  07/03/2017   Blind left eye 10/2013   cva   Breast cancer ([place]) 05/2014   ER+/PR+/Her2-     LUNG CANCER   Bundle branch block left    Chest pain    Family history of bladder cancer    Family history of breast cancer    Family history of prostate cancer    GERD (gastroesophageal reflux disease)    History of blood transfusion    age 85   Hyperlipidemia    Hypertension    Hypothyroid    Lung nodule    Melanoma in situ of face ([place]) JUNE 2015   LEFT CHEEK   Meningioma ([place])    brain lining   Migraine    reports only having one   Osteoarthritis    Personal history of radiation therapy    Radiation 09/16/14-10/07/14   Right  Breast  21 fractions   Skin cancer    Stroke ([place]) 2015   Blind in left eye as a result   Social History   Socioeconomic History   Marital status: Married    Spouse name: Not on file   Number of children: 4   Years of education: Not on file   Highest education level: Not on file  Occupational History   Not on file  Tobacco Use   Smoking status: Never   Smokeless tobacco: Never  Vaping Use   Vaping Use: Never used  Substance and Sexual Activity   Alcohol use: No   Drug use: No   Sexual activity: Not on file  Other Topics Concern   Not on file  Social History Narrative   Not on file   Social Determinants of Health   Financial Resource Strain: Not on file  Food Insecurity: Not on file  Transportation Needs: Not on file  Physical Activity: Not on file  Stress: Not on file  Social Connections: Not on file  Intimate Partner Violence: Not on file   Family History  Problem Relation Age of Onset   CAD Mother        CABG   Hyperlipidemia Mother    AAA (abdominal aortic aneurysm) Mother    Renal Disease Father    Kidney disease Father    CAD Father    Cancer Father        PROSTATE   Diabetes Brother    Hyperlipidemia Brother    Breast cancer Daughter 44   Breast cancer Sister 5   Breast cancer Sister 78   Cancer Brother        NOS    Bladder Cancer Brother 15   Lung cancer Cousin        3 paternal cousins with lung cancer   Breast cancer Cousin        five paternal first cousins  Cancer Cousin        4 paternal first cousins with Cancer NOS   Cancer Cousin        1 paternal cousin with oral cancer (tongue)        Vanessa Kick, MD 06/22/21 [phone]

## 2021-06-23 ENCOUNTER — Emergency Department (HOSPITAL_BASED_OUTPATIENT_CLINIC_OR_DEPARTMENT_OTHER)
Admission: EM | Admit: 2021-06-23 | Discharge: 2021-06-24 | Disposition: A | Discharge status: Home / Self Care | Payer: Medicare HMO | Attending: Emergency Medicine | Admitting: Emergency Medicine

## 2021-06-23 ENCOUNTER — Emergency Department (HOSPITAL_BASED_OUTPATIENT_CLINIC_OR_DEPARTMENT_OTHER): Payer: Medicare HMO

## 2021-06-23 ENCOUNTER — Encounter (HOSPITAL_BASED_OUTPATIENT_CLINIC_OR_DEPARTMENT_OTHER): Payer: Self-pay

## 2021-06-23 ENCOUNTER — Other Ambulatory Visit: Payer: Self-pay

## 2021-06-23 DIAGNOSIS — I10 Essential (primary) hypertension: Secondary | ICD-10-CM | POA: Diagnosis not present

## 2021-06-23 DIAGNOSIS — I700 Atherosclerosis of aorta: Secondary | ICD-10-CM | POA: Diagnosis not present

## 2021-06-23 DIAGNOSIS — Z7982 Long term (current) use of aspirin: Secondary | ICD-10-CM | POA: Insufficient documentation

## 2021-06-23 DIAGNOSIS — K5792 Diverticulitis of intestine, part unspecified, without perforation or abscess without bleeding: Secondary | ICD-10-CM | POA: Diagnosis not present

## 2021-06-23 DIAGNOSIS — R109 Unspecified abdominal pain: Secondary | ICD-10-CM | POA: Diagnosis present

## 2021-06-23 DIAGNOSIS — Z79899 Other long term (current) drug therapy: Secondary | ICD-10-CM | POA: Insufficient documentation

## 2021-06-23 LAB — COMPREHENSIVE METABOLIC PANEL
ALT: 14 U/L (ref 0–44)
AST: 19 U/L (ref 15–41)
Albumin: 4.3 g/dL (ref 3.5–5.0)
Alkaline Phosphatase: 111 U/L (ref 38–126)
Anion gap: 8 (ref 5–15)
BUN: 22 mg/dL (ref 8–23)
CO2: 27 mmol/L (ref 22–32)
Calcium: 9.2 mg/dL (ref 8.9–10.3)
Chloride: 103 mmol/L (ref 98–111)
Creatinine, Ser: 0.72 mg/dL (ref 0.44–1.00)
GFR, Estimated: >60 mL/min (ref >60)
Glucose, Bld: 114 mg/dL — ABNORMAL HIGH (ref 70–99)
Potassium: 4.2 mmol/L (ref 3.5–5.1)
Sodium: 138 mmol/L (ref 135–145)
Total Bilirubin: 0.5 mg/dL (ref 0.3–1.2)
Total Protein: 7.3 g/dL (ref 6.5–8.1)

## 2021-06-23 LAB — CBC
HCT: 38.7 % (ref 36.0–46.0)
Hemoglobin: 12.6 g/dL (ref 12.0–15.0)
MCH: 28.9 pg (ref 26.0–34.0)
MCHC: 32.6 g/dL (ref 30.0–36.0)
MCV: 88.8 fL (ref 80.0–100.0)
Platelets: 153 10*3/uL (ref 150–400)
RBC: 4.36 MIL/uL (ref 3.87–5.11)
RDW: 14.3 % (ref 11.5–15.5)
WBC: 8.4 10*3/uL (ref 4.0–10.5)
nRBC: 0 % (ref 0.0–0.2)

## 2021-06-23 LAB — LIPASE, BLOOD: Lipase: 39 U/L (ref 11–51)

## 2021-06-23 LAB — URINE CULTURE: Culture: <10000 — AB

## 2021-06-23 MED ORDER — IOHEXOL 300 MG/ML  SOLN
100.0000 mL | Freq: Once | INTRAMUSCULAR | Status: AC | PRN
  Administered 2021-06-23: 80 mL via INTRAVENOUS

## 2021-06-23 NOTE — ED Triage Notes (Addendum)
Pt c/o lower abd pain x 3 days. Pt sent from UC and had a negative UA. Denies N/V/D. Last BM was today and reported as soft.

## 2021-06-24 LAB — URINALYSIS, ROUTINE W REFLEX MICROSCOPIC
Bilirubin Urine: NEGATIVE
Glucose, UA: NEGATIVE mg/dL
Ketones, ur: NEGATIVE mg/dL
Leukocytes,Ua: NEGATIVE
Nitrite: NEGATIVE
Protein, ur: NEGATIVE mg/dL
Specific Gravity, Urine: 1.04 — ABNORMAL HIGH (ref 1.005–1.030)
pH: 5.5 (ref 5.0–8.0)

## 2021-06-24 MED ORDER — METRONIDAZOLE 500 MG PO TABS: 500.0000 mg | ORAL_TABLET | Freq: Two times a day (BID) | ORAL | 0 refills | Status: DC

## 2021-06-24 MED ORDER — ACETAMINOPHEN 500 MG PO TABS
1000.0000 mg | ORAL_TABLET | Freq: Once | ORAL | Status: AC
  Administered 2021-06-24: 1000 mg via ORAL
  Filled 2021-06-24: qty 2

## 2021-06-24 MED ORDER — METRONIDAZOLE 500 MG PO TABS
500.0000 mg | ORAL_TABLET | Freq: Once | ORAL | Status: AC
Start: 2021-06-24 — End: 2021-06-24
  Administered 2021-06-24: 500 mg via ORAL
  Filled 2021-06-24: qty 1

## 2021-06-24 NOTE — ED Provider Notes (Signed)
[facility] EMERGENCY DEPT Provider Note   CSN: [id number] Arrival date & time: 06/23/21  1814     History  Chief Complaint  Patient presents with   Abdominal Pain    Melissa Snyder is a 88 y.o. female.  88 year old female with history of urinary tract infections, diverticulitis in the past and hypertension who presents emerged from today secondary to suprapubic pain.  Patient states that she has had this for a few days.  She states that most recently she was seen in urgent care and diagnosed with cystitis and started antibiotics but then called back and said that her culture was negative so she is unsure on what is going on.  She has an appoint with her doctor on Monday.  She presents here for further evaluation.  No fevers, nausea, vomiting or other associated symptoms. No trauma or rash.    Abdominal Pain     Home Medications Prior to Admission medications   Medication Sig Start Date End Date Taking? Authorizing Provider  metroNIDAZOLE (FLAGYL) 500 MG tablet Take 1 tablet (500 mg total) by mouth 2 (two) times daily. One po bid x 7 days 06/24/21  Yes Shamel Galyean, Corene Cornea, MD  aspirin 81 MG EC tablet  05/09/09   [provider]  atenolol (TENORMIN) 50 MG tablet Take 50 mg by mouth at bedtime.     [provider]  b complex vitamins capsule Take 1 capsule by mouth daily.    [provider]  Bartolo Darter COVID-19 AG HOME TEST KIT See admin instructions. 02/13/21   [provider]  calcium carbonate (OSCAL) 1500 (600 Ca) MG TABS tablet  04/25/11   [provider]  calcium-vitamin D (OSCAL WITH D) 500-200 MG-UNIT per tablet Take 1 tablet by mouth at bedtime.     [provider]  cephALEXin (KEFLEX) 500 MG capsule Take 1 capsule (500 mg total) by mouth 2 (two) times daily. 06/22/21   Vanessa Kick, MD  Cholecalciferol (VITAMIN D3) 1000 units CAPS Take by mouth daily.    [provider]  denosumab (PROLIA) 60 MG/ML SOSY  injection  11/03/15   [provider]  Digestive Enzymes (ENZYME DIGEST PO) Take 5 mLs by mouth 3 (three) times daily with meals.    [provider]  fish oil-omega-3 fatty acids 1000 MG capsule Take 1 g by mouth daily.     [provider]  FLUZONE HIGH-DOSE QUADRIVALENT 0.7 ML SUSY  03/02/21   [provider]  levothyroxine (SYNTHROID, LEVOTHROID) 112 MCG tablet Take 112 mcg by mouth daily before breakfast.     [provider]  lisinopril (PRINIVIL,ZESTRIL) 10 MG tablet Take 1 tablet (10 mg total) by mouth daily. Patient taking differently: Take 10 mg by mouth 2 (two) times daily. 11/29/14   Gold, Wilder Glade, PA-C  meclizine (ANTIVERT) 12.5 MG tablet Take 12.5 mg by mouth 3 (three) times daily as needed for dizziness.    [provider]  Multiple Vitamin (MULTIVITAMIN) tablet Take 1 tablet by mouth every morning.     [provider]  Omega-3 1000 MG CAPS Take 1 g by mouth daily.    [provider]  Polyvinyl Alcohol-Povidone (REFRESH OP) Apply 1 drop to eye as needed (DRY EYES).     [provider]  Triamcinolone Acetonide (NASACORT AQ NA) Place 1 spray into the nose at bedtime as needed (CONGESTION).    [provider]  vitamin E 180 MG (400 UNITS) capsule Take 400 Units by mouth  every Monday, Wednesday, and Friday. Mon Wed Fri    [provider]      Allergies    Beef-derived products, Other, Chocolate, Ibuprofen, Niacin and related, and Tramadol    Review of Systems   Review of Systems  Gastrointestinal:  Positive for abdominal pain.   Physical Exam Updated Vital Signs BP (!) 179/83    Pulse 72    Temp 97.8 F (36.6 C)    Resp 16    Ht $R'5\' 5"'st$  (1.651 m)    Wt 53.5 kg    SpO2 100%    BMI 19.64 kg/m  Physical Exam Vitals and nursing note reviewed.  Constitutional:      Appearance: She is well-developed.  HENT:     Head: Normocephalic and atraumatic.  Cardiovascular:     Rate and Rhythm:  Normal rate and regular rhythm.  Pulmonary:     Effort: No respiratory distress.     Breath sounds: No stridor.  Abdominal:     General: There is no distension.     Tenderness: There is abdominal tenderness in the suprapubic area.  Musculoskeletal:     Cervical back: Normal range of motion.  Skin:    General: Skin is warm and dry.  Neurological:     General: No focal deficit present.     Mental Status: She is alert.    ED Results / Procedures / Treatments   Labs (all labs ordered are listed, but only abnormal results are displayed) Labs Reviewed  COMPREHENSIVE METABOLIC PANEL - Abnormal; Notable for the following components:      Result Value   Glucose, Bld 114 (*)    All other components within normal limits  URINALYSIS, ROUTINE W REFLEX MICROSCOPIC - Abnormal; Notable for the following components:   Specific Gravity, Urine 1.040 (*)    Hgb urine dipstick SMALL (*)    All other components within normal limits  LIPASE, BLOOD  CBC    EKG None  Radiology CT ABDOMEN PELVIS W CONTRAST  Result Date: 06/23/2021 CLINICAL DATA:  Lower abdominal pain EXAM: CT ABDOMEN AND PELVIS WITH CONTRAST TECHNIQUE: Multidetector CT imaging of the abdomen and pelvis was performed using the standard protocol following bolus administration of intravenous contrast. RADIATION DOSE REDUCTION: This exam was performed according to the departmental dose-optimization program which includes automated exposure control, adjustment of the mA and/or kV according to patient size and/or use of iterative reconstruction technique. CONTRAST:  12mL OMNIPAQUE IOHEXOL 300 MG/ML  SOLN COMPARISON:  03/18/2008 CT, PET CT 10/25/2020, chest CT 06/01/2021 FINDINGS: Lower chest: Incompletely visualized linear and ground-glass density in the left lung base. No pleural effusion. Hepatobiliary: Status post cholecystectomy. Mild intra hepatic biliary dilatation. Common bile duct is nondilated. Pancreas: Unremarkable. No pancreatic  ductal dilatation or surrounding inflammatory changes. Spleen: Normal in size without focal abnormality. Adrenals/Urinary Tract: Adrenal glands are normal. Kidneys show no hydronephrosis. Small parapelvic cysts on the left. The bladder is normal. Stomach/Bowel: Stomach is nonenlarged. No dilated small bowel. Moderate stool in the colon. Diverticular disease of the sigmoid colon. Acute wall thickening with inflammation. No perforation or abscess. Vascular/Lymphatic: Moderate aortic atherosclerosis. No aneurysm. No suspicious lymph nodes Reproductive: Status post hysterectomy. No adnexal masses. Other: Negative for pelvic effusion or free air. Musculoskeletal: Left hip replacement with artifact. Hemangioma in T12 vertebral body. No acute osseous abnormality IMPRESSION: 1. Findings consistent with acute sigmoid colon diverticulitis without evidence for perforation or abscess. 2. Status post cholecystectomy. Mild intra hepatic biliary dilatation probably due to  surgical change though suggest correlation with LFT 3. Incompletely visualized linear and ground-glass density at the left lung base, see chest CT from December Electronically Signed   By: Donavan Foil M.D.   On: 06/23/2021 23:56    Procedures Procedures    Medications Ordered in ED Medications  iohexol (OMNIPAQUE) 300 MG/ML solution 100 mL (80 mLs Intravenous Contrast Given 06/23/21 2331)  metroNIDAZOLE (FLAGYL) tablet 500 mg (500 mg Oral Given 06/24/21 0104)  acetaminophen (TYLENOL) tablet 1,000 mg (1,000 mg Oral Given 06/24/21 0104)    ED Course/ Medical Decision Making/ A&P                           Medical Decision Making Amount and/or Complexity of Data Reviewed Labs: ordered.  Risk OTC drugs. Prescription drug management.   Found to have diverticulitis which is uncomplicated on CT scan.  She is already on Keflex we will go and add on Flagyl.  Follow-up with PCP as scheduled.    Final Clinical Impression(s) / ED Diagnoses Final  diagnoses:  Diverticulitis    Rx / DC Orders ED Discharge Orders          Ordered    metroNIDAZOLE (FLAGYL) 500 MG tablet  2 times daily        06/24/21 0100              Hasina Kreager, Corene Cornea, MD 06/24/21 2224

## 2021-08-01 DIAGNOSIS — M810 Age-related osteoporosis without current pathological fracture: Secondary | ICD-10-CM | POA: Diagnosis not present

## 2021-08-01 DIAGNOSIS — I10 Essential (primary) hypertension: Secondary | ICD-10-CM | POA: Diagnosis not present

## 2021-08-01 DIAGNOSIS — E785 Hyperlipidemia, unspecified: Secondary | ICD-10-CM | POA: Diagnosis not present

## 2021-08-01 DIAGNOSIS — M1990 Unspecified osteoarthritis, unspecified site: Secondary | ICD-10-CM | POA: Diagnosis not present

## 2021-08-04 ENCOUNTER — Ambulatory Visit: Payer: Medicare HMO

## 2021-08-08 ENCOUNTER — Ambulatory Visit: Payer: Medicare HMO

## 2021-08-08 DIAGNOSIS — M25552 Pain in left hip: Secondary | ICD-10-CM | POA: Diagnosis not present

## 2021-08-22 DIAGNOSIS — M25552 Pain in left hip: Secondary | ICD-10-CM | POA: Diagnosis not present

## 2021-08-28 DIAGNOSIS — E785 Hyperlipidemia, unspecified: Secondary | ICD-10-CM | POA: Diagnosis not present

## 2021-08-28 DIAGNOSIS — K219 Gastro-esophageal reflux disease without esophagitis: Secondary | ICD-10-CM | POA: Diagnosis not present

## 2021-08-28 DIAGNOSIS — M1990 Unspecified osteoarthritis, unspecified site: Secondary | ICD-10-CM | POA: Diagnosis not present

## 2021-08-28 DIAGNOSIS — M810 Age-related osteoporosis without current pathological fracture: Secondary | ICD-10-CM | POA: Diagnosis not present

## 2021-08-28 DIAGNOSIS — C3490 Malignant neoplasm of unspecified part of unspecified bronchus or lung: Secondary | ICD-10-CM | POA: Diagnosis not present

## 2021-08-28 DIAGNOSIS — I2510 Atherosclerotic heart disease of native coronary artery without angina pectoris: Secondary | ICD-10-CM | POA: Diagnosis not present

## 2021-08-28 DIAGNOSIS — E034 Atrophy of thyroid (acquired): Secondary | ICD-10-CM | POA: Diagnosis not present

## 2021-08-28 DIAGNOSIS — I700 Atherosclerosis of aorta: Secondary | ICD-10-CM | POA: Diagnosis not present

## 2021-08-28 DIAGNOSIS — G8929 Other chronic pain: Secondary | ICD-10-CM | POA: Diagnosis not present

## 2021-08-28 DIAGNOSIS — I10 Essential (primary) hypertension: Secondary | ICD-10-CM | POA: Diagnosis not present

## 2021-08-28 DIAGNOSIS — Z008 Encounter for other general examination: Secondary | ICD-10-CM | POA: Diagnosis not present

## 2021-08-28 DIAGNOSIS — M8580 Other specified disorders of bone density and structure, unspecified site: Secondary | ICD-10-CM | POA: Diagnosis not present

## 2021-08-28 DIAGNOSIS — J302 Other seasonal allergic rhinitis: Secondary | ICD-10-CM | POA: Diagnosis not present

## 2021-08-30 DIAGNOSIS — M7062 Trochanteric bursitis, left hip: Secondary | ICD-10-CM | POA: Diagnosis not present

## 2021-08-30 DIAGNOSIS — S76912D Strain of unspecified muscles, fascia and tendons at thigh level, left thigh, subsequent encounter: Secondary | ICD-10-CM | POA: Diagnosis not present

## 2021-09-04 ENCOUNTER — Telehealth: Payer: Self-pay | Admitting: Hematology

## 2021-09-04 DIAGNOSIS — S76912D Strain of unspecified muscles, fascia and tendons at thigh level, left thigh, subsequent encounter: Secondary | ICD-10-CM | POA: Diagnosis not present

## 2021-09-04 DIAGNOSIS — M7062 Trochanteric bursitis, left hip: Secondary | ICD-10-CM | POA: Diagnosis not present

## 2021-09-04 NOTE — Telephone Encounter (Signed)
Left pt a message about reschedule. Left call back number if changes are needed. ?

## 2021-09-11 DIAGNOSIS — M7062 Trochanteric bursitis, left hip: Secondary | ICD-10-CM | POA: Diagnosis not present

## 2021-09-11 DIAGNOSIS — S76912D Strain of unspecified muscles, fascia and tendons at thigh level, left thigh, subsequent encounter: Secondary | ICD-10-CM | POA: Diagnosis not present

## 2021-09-12 ENCOUNTER — Ambulatory Visit
Admission: RE | Admit: 2021-09-12 | Discharge: 2021-09-12 | Disposition: A | Discharge status: Home / Self Care | Payer: Medicare HMO | Source: Ambulatory Visit | Attending: Hematology | Admitting: Hematology

## 2021-09-12 DIAGNOSIS — C50411 Malignant neoplasm of upper-outer quadrant of right female breast: Secondary | ICD-10-CM

## 2021-09-12 DIAGNOSIS — Z1231 Encounter for screening mammogram for malignant neoplasm of breast: Secondary | ICD-10-CM | POA: Diagnosis not present

## 2021-09-13 DIAGNOSIS — S76912D Strain of unspecified muscles, fascia and tendons at thigh level, left thigh, subsequent encounter: Secondary | ICD-10-CM | POA: Diagnosis not present

## 2021-09-13 DIAGNOSIS — M7062 Trochanteric bursitis, left hip: Secondary | ICD-10-CM | POA: Diagnosis not present

## 2021-09-15 ENCOUNTER — Telehealth: Payer: Self-pay

## 2021-09-15 NOTE — Telephone Encounter (Signed)
Opened in error

## 2021-09-18 ENCOUNTER — Other Ambulatory Visit: Payer: Self-pay

## 2021-09-18 ENCOUNTER — Inpatient Hospital Stay: Payer: Medicare HMO | Attending: Hematology

## 2021-09-18 ENCOUNTER — Ambulatory Visit (HOSPITAL_COMMUNITY)
Admission: RE | Admit: 2021-09-18 | Discharge: 2021-09-18 | Disposition: A | Discharge status: Home / Self Care | Payer: Medicare HMO | Source: Ambulatory Visit | Attending: Hematology | Admitting: Hematology

## 2021-09-18 DIAGNOSIS — C3490 Malignant neoplasm of unspecified part of unspecified bronchus or lung: Secondary | ICD-10-CM | POA: Diagnosis not present

## 2021-09-18 DIAGNOSIS — R918 Other nonspecific abnormal finding of lung field: Secondary | ICD-10-CM | POA: Diagnosis not present

## 2021-09-18 DIAGNOSIS — J90 Pleural effusion, not elsewhere classified: Secondary | ICD-10-CM | POA: Diagnosis not present

## 2021-09-18 DIAGNOSIS — C3432 Malignant neoplasm of lower lobe, left bronchus or lung: Secondary | ICD-10-CM | POA: Diagnosis not present

## 2021-09-18 DIAGNOSIS — Z170 Estrogen receptor positive status [ER+]: Secondary | ICD-10-CM

## 2021-09-18 DIAGNOSIS — J479 Bronchiectasis, uncomplicated: Secondary | ICD-10-CM | POA: Diagnosis not present

## 2021-09-18 LAB — CBC WITH DIFFERENTIAL (CANCER CENTER ONLY)
Abs Immature Granulocytes: 0.01 10*3/uL (ref 0.00–0.07)
Basophils Absolute: 0 10*3/uL (ref 0.0–0.1)
Basophils Relative: 1 %
Eosinophils Absolute: 0.1 10*3/uL (ref 0.0–0.5)
Eosinophils Relative: 2 %
HCT: 36.3 % (ref 36.0–46.0)
Hemoglobin: 11.9 g/dL — ABNORMAL LOW (ref 12.0–15.0)
Immature Granulocytes: 0 %
Lymphocytes Relative: 15 %
Lymphs Abs: 0.7 10*3/uL (ref 0.7–4.0)
MCH: 29.5 pg (ref 26.0–34.0)
MCHC: 32.8 g/dL (ref 30.0–36.0)
MCV: 90.1 fL (ref 80.0–100.0)
Monocytes Absolute: 0.4 10*3/uL (ref 0.1–1.0)
Monocytes Relative: 8 %
Neutro Abs: 3.4 10*3/uL (ref 1.7–7.7)
Neutrophils Relative %: 74 %
Platelet Count: 155 10*3/uL (ref 150–400)
RBC: 4.03 MIL/uL (ref 3.87–5.11)
RDW: 14.2 % (ref 11.5–15.5)
WBC Count: 4.6 10*3/uL (ref 4.0–10.5)
nRBC: 0 % (ref 0.0–0.2)

## 2021-09-18 LAB — CMP (CANCER CENTER ONLY)
ALT: 14 U/L (ref 0–44)
AST: 18 U/L (ref 15–41)
Albumin: 4 g/dL (ref 3.5–5.0)
Alkaline Phosphatase: 112 U/L (ref 38–126)
Anion gap: 5 (ref 5–15)
BUN: 19 mg/dL (ref 8–23)
CO2: 32 mmol/L (ref 22–32)
Calcium: 9.1 mg/dL (ref 8.9–10.3)
Chloride: 105 mmol/L (ref 98–111)
Creatinine: 0.72 mg/dL (ref 0.44–1.00)
GFR, Estimated: >60 mL/min (ref >60)
Glucose, Bld: 109 mg/dL — ABNORMAL HIGH (ref 70–99)
Potassium: 3.9 mmol/L (ref 3.5–5.1)
Sodium: 142 mmol/L (ref 135–145)
Total Bilirubin: 0.5 mg/dL (ref 0.3–1.2)
Total Protein: 6.9 g/dL (ref 6.5–8.1)

## 2021-09-20 DIAGNOSIS — S76912D Strain of unspecified muscles, fascia and tendons at thigh level, left thigh, subsequent encounter: Secondary | ICD-10-CM | POA: Diagnosis not present

## 2021-09-20 DIAGNOSIS — M7062 Trochanteric bursitis, left hip: Secondary | ICD-10-CM | POA: Diagnosis not present

## 2021-09-21 ENCOUNTER — Ambulatory Visit: Payer: Medicare HMO | Admitting: Hematology

## 2021-09-25 DIAGNOSIS — S76912D Strain of unspecified muscles, fascia and tendons at thigh level, left thigh, subsequent encounter: Secondary | ICD-10-CM | POA: Diagnosis not present

## 2021-09-25 DIAGNOSIS — M7062 Trochanteric bursitis, left hip: Secondary | ICD-10-CM | POA: Diagnosis not present

## 2021-10-09 ENCOUNTER — Other Ambulatory Visit: Payer: Self-pay

## 2021-10-09 ENCOUNTER — Inpatient Hospital Stay: Payer: Medicare HMO | Attending: Hematology | Admitting: Hematology

## 2021-10-09 VITALS — BP 184/73 | HR 67 | Temp 98.3°F | Resp 18 | Ht 65.0 in | Wt 116.2 lb

## 2021-10-09 DIAGNOSIS — Z853 Personal history of malignant neoplasm of breast: Secondary | ICD-10-CM | POA: Diagnosis not present

## 2021-10-09 DIAGNOSIS — C50411 Malignant neoplasm of upper-outer quadrant of right female breast: Secondary | ICD-10-CM | POA: Diagnosis not present

## 2021-10-09 DIAGNOSIS — C3432 Malignant neoplasm of lower lobe, left bronchus or lung: Secondary | ICD-10-CM

## 2021-10-09 DIAGNOSIS — Z79899 Other long term (current) drug therapy: Secondary | ICD-10-CM | POA: Diagnosis not present

## 2021-10-09 DIAGNOSIS — Z170 Estrogen receptor positive status [ER+]: Secondary | ICD-10-CM | POA: Diagnosis not present

## 2021-10-09 NOTE — Progress Notes (Signed)
?Melissa Snyder   ?[phone] [phone]   ?Clinic Follow up Note  ? ?Patient Care Team: ?Melissa Infante, MD as PCP - General (Internal Medicine) ?Melissa Nakayama, MD as Consulting Physician (Cardiothoracic Surgery) ?Melissa Loser, MD as Consulting Physician (Urology) ?Melissa Merle, MD as Consulting Physician (Hematology) ?Melissa Silversmith, MD as Consulting Physician (Radiation Oncology) ?Melissa Pedro, PA-C as Physician Assistant (Radiation Oncology) ? ?Date of Service:  10/09/2021 ? ?CHIEF COMPLAINT: f/u of lung and breast cancer ? ?CURRENT THERAPY:  ?Surveillance ? ?ASSESSMENT & PLAN:  ?Melissa Snyder is a 88 y.o. female with  ? ?1. Stage I left lung adenocarcinoma, pT1aN0M0 in 2016, LLL recurrence in 06/2017, LUL recurrence in 10/2020 ?-initially diagnosed in 11/2014, s/p LLL segmentectomy by Dr. Roxan Hockey, pathology showing stage I adenocarcinoma ?-She had cancer recurrence in LLL in 06/2017. She was treated with radiation in 07/2017 with Dr. Lisbeth Renshaw.  ?-CT chest from 10/10/20 and PET from 10/27/20 showed 2 enlarging nodules in the left upper lobe, measuring 1.2 and 1.3 cm, slightly increased from PET scan in 07/2019.  ?-she received 5 fractions SBRT under Dr. Lisbeth Renshaw 11/2020. She had a lot more fatigue with this treatment. ?-surveillance chest CT on 09/18/21 showed: improving radiation fibrosis; NED. I reviewed the results and images with her today. ?-she is clinically stable. Labs from 09/18/21 reviewed, overall WNL. ?-Due to her multiple recurrence, will continue CT scan every 6 months for a few more years  ?  ?2. Right breast invasive ductal carcinoma, pT1b N0 M0 stage IA, ER positive PR positive and HER-2 negative. ?-diagnosed in 05/2014, s/p right lumpectomy and adjuvant radiation. She declined antiestrogens due to cost and concern with side effects.  ?-most recent mammogram 09/12/21 was negative. ?-She is clinically stable from a breast standpoint. There is no clinical  concern for recurrence. ?-Continue Surveillance.  ?  ?3. Benign meningiomas ?-s/p gamma knife treatment in 02/2014 ?-followed by Dr. Salomon Fick ?-last brain MRI 06/04/19 was stable. ?  ?  ?Plan: ?-f/u in 6 months with lab and CT chest a week before ? ? ?No problem-specific Assessment & Plan notes found for this encounter. ? ? ?SUMMARY OF ONCOLOGIC HISTORY: ?Oncology History Overview Note  ? Cancer Staging  ?Breast cancer of upper-outer quadrant of right female breast ([place]) ?Staging form: Breast, AJCC 7th Edition ?- Clinical stage from 05/19/2014: Stage IA (T1b, N0, M0) - Signed by Holley Bouche, NP on 11/17/2015 ?- Pathologic stage from 10/17/2014: Stage IA (T1b, N0, cM0) - Signed by Melissa Silversmith, MD on 10/17/2014 ? ?Primary cancer of left lower lobe of lung ([place]) ?Staging form: Lung, AJCC 7th Edition ?- Clinical stage from 12/14/2014: Stage IA (T1a, N0, M0) - Unsigned ? ?  ?Breast cancer of upper-outer quadrant of right female breast ([place])  ?05/04/2014 Initial Diagnosis  ? Breast cancer ? ?  ?05/19/2014 Imaging  ? Diagnostic mammogram and ultrasound showed a 7 mm irregular suspicious mass located in the right breast at the 10:00 position, 7 cm from the nipple. Otherwise negative. ? ?  ?05/19/2014 Initial Biopsy  ? Right breast biopsy (UOQ): IDC, grade 2.  ? ?  ?05/19/2014 Receptors her2  ? ER 100% positive, PR 35% positive, HER-2 negative, Ki67 8% ? ?  ?06/07/2014 Miscellaneous  ? Genetic testing normal.  APC, ATM, BARD1, BMPR1A, BRCA1, BRCA2, BRIP1, CDH1, CDK4, CDKN2A, CHEK2, EPCAM, GREM1, MLH1, MRE11A, MSH2, MSH6, MUTYH, NBN, NF1, PALB2, PMS2, POLD1, POLE, PTEN, RAD50, RAD51D, SMAD4, SMARCA4, STK11, and TP53 tested.  ? ?  ?  07/23/2014 Surgery  ? Right breast lumpectomy with SLNB Lucia Gaskins) showed invasive ductal carcinoma & DCIS; negative surgical margins. ? ?  ?07/23/2014 Pathology Results  ? Invasive ductal carcinoma, tumor size 0.8 cm, grade 1, no lymphovascular invasion, margins were negative, 2 sentinel lymph nodes  were negative. (+) DCIS. ? ?  ?07/23/2014 Pathologic Stage  ? pT1b, pN0: Stage IA  ? ?  ?09/16/2014 - 10/07/2014 Radiation Therapy  ? XRT completed Pablo Ledger). Right breast/ 42.72 Gy at 2.67 Gy per fraction x 21 fractions.  ? ?  ? Anti-estrogen oral therapy  ? Aromasin prescribed in 01/2015 but Patient declines anti-estrogen therapy at this time d/t high co-pay of medication and possible side effects of AI therapy.  ?  ?05/02/2015 Mammogram  ? Diagnostic TOMO bilat mammogram: No evidence of malignancy in either breast. Lumpectomy changes in right breast. Diagnostic mammo suggested in 1 year.  ? ?  ?Primary cancer of left lower lobe of lung ([place])  ?10/25/2013 Imaging  ? CXR done for stroke protocol. No acute chest findings. Chronic lung disease with biapical scarring. Developing nodule in (L) apex cannot be excluded based on exam, athough may be d/t overlap of osseous structures.  ? ?  ?10/27/2013 Imaging  ? CXR: Nodular opacity in mid (L) apex and appears more prominent than prior study. Area that appeared masslike in LUL near apex on recent CXR not seen at this time.  Underlying emphysema present. No edema or consolidation ? ?  ?11/12/2013 Imaging  ? CT chest: At left lung apex, there is asymmetric pleural parenchymal scarring. No discrete lesion.  In superior segment of LLL, there are 1 dominant ground-glass opacity measuring 16 mm. 2 addt'l ground-glass opacities appreciated. F/U with CT in 3 months ? ?  ?02/11/2014 Imaging  ? CT chest: Several small bilat faint nodular densities, largest measures 1.3 cm over superior segment LLL. No new nodules, no adenopathy. Cannot exclude metastatic disease given pt's h/o melanoma. Recommend PET-CT ? ?  ?02/23/2014 PET scan  ? Persistent semi-solid nodule in superior segment LLL with low metabolic activity. Moderately metabolic subcarinal & hilar LN are likely reactive.  ? ?  ?05/25/2014 Imaging  ? CT chest: Persistent & similar size dominant LLL sub-solid nodule. Adenocarcinoma is  considered. Biopsy or resection strongly considered. Additional scattered small ground-glass nodules stable.  ? ?  ?06/02/2014 Imaging  ? MRI brain: Grossly stable appearance of left planum sphenoidale meningioma measuring 20 x 14 mm. Meningioma does not extend to left orbital apex & contacts left optic nerve. Stable right posterior clinoid meningioma .  ? ?  ?09/27/2014 Imaging  ? CT chest: Further enlargment of central solid component in sub-solid LLL pulm nodule. Findings remain concerning for adenoca. Additional scattered small bilat ground-glass nodules and biapical scarring stable. No adenopathy.   ? ?  ?11/25/2014 Surgery  ? Video bronchoscopy with endobronchial ultrasound; Video assisted thoracoscopy; Thoracoscopic LLL superior segmentectomy; Mediastinal lymph node dissection; Cyro intercostal nerve block Roxan Hockey) ? ?  ?11/25/2014 Pathology Results  ? Superior segment lung resection (LLL). Adenocarcinoma, well-diff, spanning 1.5 cm. Surgical margins negative. 5 LNs negative.  ? ?  ?11/25/2014 Pathologic Stage  ? pT1a, pN0: Stage IA  ? ?  ?11/25/2014 Initial Diagnosis  ? Primary cancer of left lower lobe of lung ([place]) ? ?  ?05/31/2015 Imaging  ? CT chest: No evidence of local recurrent at superior segmentectomy in LLL. New small nodular focus of consolidation surrounding thin parenchymal band. Additional sub-cm ground-glass and solid nodules favored to be  benign. No thoracic adenopathy ? ?  ?08/11/2015 Imaging  ? CT chest: No evidence of residual or recurrent tumor in LLL. Stable small nodular focus in LLL with thin surrounding parenchymal band, favored to be benign.  Multiple small ground-glass nodules unchanged.  ? ?  ?06/19/2017 Pathology Results  ? Diagnosis ?1. Lung, biopsy, Left lower lobe ?- ADENOCARCINOMA. ?- SEE COMMENT. ?2. Lung, biopsy, Left lower lobe ?- BENIGN LUNG PARENCHYMA. ?- THERE IS NO EVIDENCE OF MALIGNANCY. ?  ?07/09/2017 - 07/22/2017 Radiation Therapy  ? Radiation treatment dates:  07/09/17-07/22/17 by Dr. Lisbeth Renshaw  ?  ?Site/dose:  Left lung/ 50 Gy in 10 fractions ?  ?Beams/energy:  IMRT/ 6X ?  ?Narrative: The patient tolerated radiation treatment relatively well. She denied pain, fatigue, or diffic

## 2021-10-11 DIAGNOSIS — Z7689 Persons encountering health services in other specified circumstances: Secondary | ICD-10-CM | POA: Diagnosis not present

## 2021-10-11 DIAGNOSIS — I10 Essential (primary) hypertension: Secondary | ICD-10-CM | POA: Diagnosis not present

## 2021-10-11 DIAGNOSIS — R634 Abnormal weight loss: Secondary | ICD-10-CM | POA: Diagnosis not present

## 2021-10-11 DIAGNOSIS — E785 Hyperlipidemia, unspecified: Secondary | ICD-10-CM | POA: Diagnosis not present

## 2021-10-11 DIAGNOSIS — E039 Hypothyroidism, unspecified: Secondary | ICD-10-CM | POA: Diagnosis not present

## 2021-10-11 DIAGNOSIS — M1990 Unspecified osteoarthritis, unspecified site: Secondary | ICD-10-CM | POA: Diagnosis not present

## 2021-10-11 DIAGNOSIS — M810 Age-related osteoporosis without current pathological fracture: Secondary | ICD-10-CM | POA: Diagnosis not present

## 2021-10-26 DIAGNOSIS — H5201 Hypermetropia, right eye: Secondary | ICD-10-CM | POA: Diagnosis not present

## 2021-10-26 DIAGNOSIS — H3411 Central retinal artery occlusion, right eye: Secondary | ICD-10-CM | POA: Diagnosis not present

## 2021-10-26 DIAGNOSIS — D3131 Benign neoplasm of right choroid: Secondary | ICD-10-CM | POA: Diagnosis not present

## 2021-11-01 DIAGNOSIS — M1990 Unspecified osteoarthritis, unspecified site: Secondary | ICD-10-CM | POA: Diagnosis not present

## 2021-11-01 DIAGNOSIS — I10 Essential (primary) hypertension: Secondary | ICD-10-CM | POA: Diagnosis not present

## 2021-11-01 DIAGNOSIS — M810 Age-related osteoporosis without current pathological fracture: Secondary | ICD-10-CM | POA: Diagnosis not present

## 2021-11-01 DIAGNOSIS — E785 Hyperlipidemia, unspecified: Secondary | ICD-10-CM | POA: Diagnosis not present

## 2021-11-14 DIAGNOSIS — R278 Other lack of coordination: Secondary | ICD-10-CM | POA: Diagnosis not present

## 2021-11-14 DIAGNOSIS — E7800 Pure hypercholesterolemia, unspecified: Secondary | ICD-10-CM | POA: Diagnosis not present

## 2021-11-14 DIAGNOSIS — I10 Essential (primary) hypertension: Secondary | ICD-10-CM | POA: Diagnosis not present

## 2021-11-14 DIAGNOSIS — M79605 Pain in left leg: Secondary | ICD-10-CM | POA: Diagnosis not present

## 2021-11-14 DIAGNOSIS — G609 Hereditary and idiopathic neuropathy, unspecified: Secondary | ICD-10-CM | POA: Diagnosis not present

## 2021-11-14 DIAGNOSIS — G2581 Restless legs syndrome: Secondary | ICD-10-CM | POA: Diagnosis not present

## 2021-11-14 DIAGNOSIS — R202 Paresthesia of skin: Secondary | ICD-10-CM | POA: Diagnosis not present

## 2021-11-14 DIAGNOSIS — M79604 Pain in right leg: Secondary | ICD-10-CM | POA: Diagnosis not present

## 2021-11-15 ENCOUNTER — Other Ambulatory Visit (HOSPITAL_COMMUNITY): Payer: Self-pay | Admitting: *Deleted

## 2021-11-16 ENCOUNTER — Ambulatory Visit (HOSPITAL_COMMUNITY)
Admission: RE | Admit: 2021-11-16 | Discharge: 2021-11-16 | Disposition: A | Discharge status: Home / Self Care | Payer: Medicare HMO | Source: Ambulatory Visit | Attending: Internal Medicine | Admitting: Internal Medicine

## 2021-11-16 DIAGNOSIS — E7800 Pure hypercholesterolemia, unspecified: Secondary | ICD-10-CM | POA: Diagnosis not present

## 2021-11-16 DIAGNOSIS — R278 Other lack of coordination: Secondary | ICD-10-CM | POA: Diagnosis not present

## 2021-11-16 DIAGNOSIS — M810 Age-related osteoporosis without current pathological fracture: Secondary | ICD-10-CM | POA: Insufficient documentation

## 2021-11-16 DIAGNOSIS — M79605 Pain in left leg: Secondary | ICD-10-CM | POA: Diagnosis not present

## 2021-11-16 DIAGNOSIS — G609 Hereditary and idiopathic neuropathy, unspecified: Secondary | ICD-10-CM | POA: Diagnosis not present

## 2021-11-16 DIAGNOSIS — G2581 Restless legs syndrome: Secondary | ICD-10-CM | POA: Diagnosis not present

## 2021-11-16 DIAGNOSIS — M79604 Pain in right leg: Secondary | ICD-10-CM | POA: Diagnosis not present

## 2021-11-16 DIAGNOSIS — R202 Paresthesia of skin: Secondary | ICD-10-CM | POA: Diagnosis not present

## 2021-11-16 DIAGNOSIS — I10 Essential (primary) hypertension: Secondary | ICD-10-CM | POA: Diagnosis not present

## 2021-11-16 MED ORDER — DENOSUMAB 60 MG/ML ~~LOC~~ SOSY
60.0000 mg | PREFILLED_SYRINGE | Freq: Once | SUBCUTANEOUS | Status: AC
  Administered 2021-11-16: 60 mg via SUBCUTANEOUS

## 2021-11-16 MED ORDER — DENOSUMAB 60 MG/ML ~~LOC~~ SOSY
PREFILLED_SYRINGE | SUBCUTANEOUS | Status: AC
  Filled 2021-11-16: qty 1

## 2021-11-17 DIAGNOSIS — R202 Paresthesia of skin: Secondary | ICD-10-CM | POA: Diagnosis not present

## 2021-11-17 DIAGNOSIS — G2581 Restless legs syndrome: Secondary | ICD-10-CM | POA: Diagnosis not present

## 2021-11-17 DIAGNOSIS — M79605 Pain in left leg: Secondary | ICD-10-CM | POA: Diagnosis not present

## 2021-11-17 DIAGNOSIS — M79604 Pain in right leg: Secondary | ICD-10-CM | POA: Diagnosis not present

## 2021-11-17 DIAGNOSIS — I10 Essential (primary) hypertension: Secondary | ICD-10-CM | POA: Diagnosis not present

## 2021-11-17 DIAGNOSIS — G609 Hereditary and idiopathic neuropathy, unspecified: Secondary | ICD-10-CM | POA: Diagnosis not present

## 2021-11-17 DIAGNOSIS — E7800 Pure hypercholesterolemia, unspecified: Secondary | ICD-10-CM | POA: Diagnosis not present

## 2021-11-17 DIAGNOSIS — R278 Other lack of coordination: Secondary | ICD-10-CM | POA: Diagnosis not present

## 2021-11-20 DIAGNOSIS — I10 Essential (primary) hypertension: Secondary | ICD-10-CM | POA: Diagnosis not present

## 2021-11-20 DIAGNOSIS — R202 Paresthesia of skin: Secondary | ICD-10-CM | POA: Diagnosis not present

## 2021-11-20 DIAGNOSIS — G2581 Restless legs syndrome: Secondary | ICD-10-CM | POA: Diagnosis not present

## 2021-11-20 DIAGNOSIS — M79605 Pain in left leg: Secondary | ICD-10-CM | POA: Diagnosis not present

## 2021-11-20 DIAGNOSIS — G609 Hereditary and idiopathic neuropathy, unspecified: Secondary | ICD-10-CM | POA: Diagnosis not present

## 2021-11-20 DIAGNOSIS — M79604 Pain in right leg: Secondary | ICD-10-CM | POA: Diagnosis not present

## 2021-11-20 DIAGNOSIS — E7800 Pure hypercholesterolemia, unspecified: Secondary | ICD-10-CM | POA: Diagnosis not present

## 2021-11-20 DIAGNOSIS — R278 Other lack of coordination: Secondary | ICD-10-CM | POA: Diagnosis not present

## 2021-11-22 DIAGNOSIS — G2581 Restless legs syndrome: Secondary | ICD-10-CM | POA: Diagnosis not present

## 2021-11-22 DIAGNOSIS — M79605 Pain in left leg: Secondary | ICD-10-CM | POA: Diagnosis not present

## 2021-11-22 DIAGNOSIS — E7800 Pure hypercholesterolemia, unspecified: Secondary | ICD-10-CM | POA: Diagnosis not present

## 2021-11-22 DIAGNOSIS — G609 Hereditary and idiopathic neuropathy, unspecified: Secondary | ICD-10-CM | POA: Diagnosis not present

## 2021-11-22 DIAGNOSIS — R278 Other lack of coordination: Secondary | ICD-10-CM | POA: Diagnosis not present

## 2021-11-22 DIAGNOSIS — R202 Paresthesia of skin: Secondary | ICD-10-CM | POA: Diagnosis not present

## 2021-11-22 DIAGNOSIS — I10 Essential (primary) hypertension: Secondary | ICD-10-CM | POA: Diagnosis not present

## 2021-11-22 DIAGNOSIS — M79604 Pain in right leg: Secondary | ICD-10-CM | POA: Diagnosis not present

## 2021-11-24 DIAGNOSIS — R202 Paresthesia of skin: Secondary | ICD-10-CM | POA: Diagnosis not present

## 2021-11-24 DIAGNOSIS — G2581 Restless legs syndrome: Secondary | ICD-10-CM | POA: Diagnosis not present

## 2021-11-24 DIAGNOSIS — E7800 Pure hypercholesterolemia, unspecified: Secondary | ICD-10-CM | POA: Diagnosis not present

## 2021-11-24 DIAGNOSIS — I10 Essential (primary) hypertension: Secondary | ICD-10-CM | POA: Diagnosis not present

## 2021-11-24 DIAGNOSIS — R278 Other lack of coordination: Secondary | ICD-10-CM | POA: Diagnosis not present

## 2021-11-24 DIAGNOSIS — G609 Hereditary and idiopathic neuropathy, unspecified: Secondary | ICD-10-CM | POA: Diagnosis not present

## 2021-11-24 DIAGNOSIS — M79605 Pain in left leg: Secondary | ICD-10-CM | POA: Diagnosis not present

## 2021-11-24 DIAGNOSIS — M79604 Pain in right leg: Secondary | ICD-10-CM | POA: Diagnosis not present

## 2021-11-27 DIAGNOSIS — R202 Paresthesia of skin: Secondary | ICD-10-CM | POA: Diagnosis not present

## 2021-11-27 DIAGNOSIS — M79604 Pain in right leg: Secondary | ICD-10-CM | POA: Diagnosis not present

## 2021-11-27 DIAGNOSIS — R278 Other lack of coordination: Secondary | ICD-10-CM | POA: Diagnosis not present

## 2021-11-27 DIAGNOSIS — E7800 Pure hypercholesterolemia, unspecified: Secondary | ICD-10-CM | POA: Diagnosis not present

## 2021-11-27 DIAGNOSIS — M79605 Pain in left leg: Secondary | ICD-10-CM | POA: Diagnosis not present

## 2021-11-27 DIAGNOSIS — G2581 Restless legs syndrome: Secondary | ICD-10-CM | POA: Diagnosis not present

## 2021-11-27 DIAGNOSIS — I10 Essential (primary) hypertension: Secondary | ICD-10-CM | POA: Diagnosis not present

## 2021-11-27 DIAGNOSIS — G609 Hereditary and idiopathic neuropathy, unspecified: Secondary | ICD-10-CM | POA: Diagnosis not present

## 2021-11-29 DIAGNOSIS — I10 Essential (primary) hypertension: Secondary | ICD-10-CM | POA: Diagnosis not present

## 2021-11-29 DIAGNOSIS — G2581 Restless legs syndrome: Secondary | ICD-10-CM | POA: Diagnosis not present

## 2021-11-29 DIAGNOSIS — R202 Paresthesia of skin: Secondary | ICD-10-CM | POA: Diagnosis not present

## 2021-11-29 DIAGNOSIS — R278 Other lack of coordination: Secondary | ICD-10-CM | POA: Diagnosis not present

## 2021-11-29 DIAGNOSIS — E7800 Pure hypercholesterolemia, unspecified: Secondary | ICD-10-CM | POA: Diagnosis not present

## 2021-11-29 DIAGNOSIS — G609 Hereditary and idiopathic neuropathy, unspecified: Secondary | ICD-10-CM | POA: Diagnosis not present

## 2021-11-29 DIAGNOSIS — M79605 Pain in left leg: Secondary | ICD-10-CM | POA: Diagnosis not present

## 2021-11-29 DIAGNOSIS — M79604 Pain in right leg: Secondary | ICD-10-CM | POA: Diagnosis not present

## 2021-12-01 DIAGNOSIS — R278 Other lack of coordination: Secondary | ICD-10-CM | POA: Diagnosis not present

## 2021-12-01 DIAGNOSIS — M810 Age-related osteoporosis without current pathological fracture: Secondary | ICD-10-CM | POA: Diagnosis not present

## 2021-12-01 DIAGNOSIS — E785 Hyperlipidemia, unspecified: Secondary | ICD-10-CM | POA: Diagnosis not present

## 2021-12-01 DIAGNOSIS — M79605 Pain in left leg: Secondary | ICD-10-CM | POA: Diagnosis not present

## 2021-12-01 DIAGNOSIS — I10 Essential (primary) hypertension: Secondary | ICD-10-CM | POA: Diagnosis not present

## 2021-12-01 DIAGNOSIS — G609 Hereditary and idiopathic neuropathy, unspecified: Secondary | ICD-10-CM | POA: Diagnosis not present

## 2021-12-01 DIAGNOSIS — G2581 Restless legs syndrome: Secondary | ICD-10-CM | POA: Diagnosis not present

## 2021-12-01 DIAGNOSIS — E7800 Pure hypercholesterolemia, unspecified: Secondary | ICD-10-CM | POA: Diagnosis not present

## 2021-12-01 DIAGNOSIS — M79604 Pain in right leg: Secondary | ICD-10-CM | POA: Diagnosis not present

## 2021-12-01 DIAGNOSIS — M1990 Unspecified osteoarthritis, unspecified site: Secondary | ICD-10-CM | POA: Diagnosis not present

## 2021-12-01 DIAGNOSIS — R202 Paresthesia of skin: Secondary | ICD-10-CM | POA: Diagnosis not present

## 2022-01-08 DIAGNOSIS — Z0100 Encounter for examination of eyes and vision without abnormal findings: Secondary | ICD-10-CM | POA: Diagnosis not present

## 2022-04-04 ENCOUNTER — Inpatient Hospital Stay: Payer: Medicare HMO | Attending: Hematology

## 2022-04-04 ENCOUNTER — Other Ambulatory Visit: Payer: Self-pay

## 2022-04-04 DIAGNOSIS — M62838 Other muscle spasm: Secondary | ICD-10-CM | POA: Diagnosis not present

## 2022-04-04 DIAGNOSIS — Z85118 Personal history of other malignant neoplasm of bronchus and lung: Secondary | ICD-10-CM | POA: Diagnosis not present

## 2022-04-04 DIAGNOSIS — Z170 Estrogen receptor positive status [ER+]: Secondary | ICD-10-CM

## 2022-04-04 DIAGNOSIS — I10 Essential (primary) hypertension: Secondary | ICD-10-CM | POA: Diagnosis not present

## 2022-04-04 DIAGNOSIS — Z853 Personal history of malignant neoplasm of breast: Secondary | ICD-10-CM | POA: Insufficient documentation

## 2022-04-04 DIAGNOSIS — M1990 Unspecified osteoarthritis, unspecified site: Secondary | ICD-10-CM | POA: Diagnosis not present

## 2022-04-04 DIAGNOSIS — Z923 Personal history of irradiation: Secondary | ICD-10-CM | POA: Insufficient documentation

## 2022-04-04 DIAGNOSIS — M79602 Pain in left arm: Secondary | ICD-10-CM | POA: Diagnosis not present

## 2022-04-04 LAB — CMP (CANCER CENTER ONLY)
ALT: 20 U/L (ref 0–44)
AST: 23 U/L (ref 15–41)
Albumin: 4.1 g/dL (ref 3.5–5.0)
Alkaline Phosphatase: 96 U/L (ref 38–126)
Anion gap: 5 (ref 5–15)
BUN: 18 mg/dL (ref 8–23)
CO2: 32 mmol/L (ref 22–32)
Calcium: 9.3 mg/dL (ref 8.9–10.3)
Chloride: 103 mmol/L (ref 98–111)
Creatinine: 0.74 mg/dL (ref 0.44–1.00)
GFR, Estimated: >60 mL/min (ref >60)
Glucose, Bld: 137 mg/dL — ABNORMAL HIGH (ref 70–99)
Potassium: 4.2 mmol/L (ref 3.5–5.1)
Sodium: 140 mmol/L (ref 135–145)
Total Bilirubin: 0.6 mg/dL (ref 0.3–1.2)
Total Protein: 7.3 g/dL (ref 6.5–8.1)

## 2022-04-04 LAB — CBC WITH DIFFERENTIAL (CANCER CENTER ONLY)
Abs Immature Granulocytes: 0.01 10*3/uL (ref 0.00–0.07)
Basophils Absolute: 0 10*3/uL (ref 0.0–0.1)
Basophils Relative: 1 %
Eosinophils Absolute: 0.1 10*3/uL (ref 0.0–0.5)
Eosinophils Relative: 3 %
HCT: 39.4 % (ref 36.0–46.0)
Hemoglobin: 13.2 g/dL (ref 12.0–15.0)
Immature Granulocytes: 0 %
Lymphocytes Relative: 18 %
Lymphs Abs: 0.7 10*3/uL (ref 0.7–4.0)
MCH: 29.6 pg (ref 26.0–34.0)
MCHC: 33.5 g/dL (ref 30.0–36.0)
MCV: 88.3 fL (ref 80.0–100.0)
Monocytes Absolute: 0.3 10*3/uL (ref 0.1–1.0)
Monocytes Relative: 8 %
Neutro Abs: 2.7 10*3/uL (ref 1.7–7.7)
Neutrophils Relative %: 70 %
Platelet Count: 151 10*3/uL (ref 150–400)
RBC: 4.46 MIL/uL (ref 3.87–5.11)
RDW: 13.9 % (ref 11.5–15.5)
WBC Count: 3.9 10*3/uL — ABNORMAL LOW (ref 4.0–10.5)
nRBC: 0 % (ref 0.0–0.2)

## 2022-04-06 ENCOUNTER — Ambulatory Visit (HOSPITAL_COMMUNITY)
Admission: RE | Admit: 2022-04-06 | Discharge: 2022-04-06 | Disposition: A | Discharge status: Home / Self Care | Payer: Medicare HMO | Source: Ambulatory Visit | Attending: Hematology | Admitting: Hematology

## 2022-04-06 DIAGNOSIS — J90 Pleural effusion, not elsewhere classified: Secondary | ICD-10-CM | POA: Diagnosis not present

## 2022-04-06 DIAGNOSIS — J984 Other disorders of lung: Secondary | ICD-10-CM | POA: Diagnosis not present

## 2022-04-06 DIAGNOSIS — C3490 Malignant neoplasm of unspecified part of unspecified bronchus or lung: Secondary | ICD-10-CM | POA: Diagnosis not present

## 2022-04-06 DIAGNOSIS — C3432 Malignant neoplasm of lower lobe, left bronchus or lung: Secondary | ICD-10-CM | POA: Insufficient documentation

## 2022-04-11 ENCOUNTER — Other Ambulatory Visit: Payer: Self-pay

## 2022-04-11 ENCOUNTER — Encounter: Payer: Self-pay | Admitting: Hematology

## 2022-04-11 ENCOUNTER — Inpatient Hospital Stay: Payer: Medicare HMO | Admitting: Hematology

## 2022-04-11 ENCOUNTER — Telehealth: Payer: Self-pay

## 2022-04-11 VITALS — BP 174/75 | HR 73 | Temp 98.0°F | Resp 18 | Ht 65.0 in | Wt 111.8 lb

## 2022-04-11 DIAGNOSIS — Z85118 Personal history of other malignant neoplasm of bronchus and lung: Secondary | ICD-10-CM | POA: Diagnosis not present

## 2022-04-11 DIAGNOSIS — Z853 Personal history of malignant neoplasm of breast: Secondary | ICD-10-CM | POA: Diagnosis not present

## 2022-04-11 DIAGNOSIS — C3432 Malignant neoplasm of lower lobe, left bronchus or lung: Secondary | ICD-10-CM

## 2022-04-11 DIAGNOSIS — Z170 Estrogen receptor positive status [ER+]: Secondary | ICD-10-CM | POA: Diagnosis not present

## 2022-04-11 DIAGNOSIS — C50411 Malignant neoplasm of upper-outer quadrant of right female breast: Secondary | ICD-10-CM

## 2022-04-11 DIAGNOSIS — Z923 Personal history of irradiation: Secondary | ICD-10-CM | POA: Diagnosis not present

## 2022-04-11 NOTE — Progress Notes (Signed)
Epic faxed pt's Mammogram orders to [facility].  Informed them mammogram is not due until 09/22/2022.  Epic fax confirmation received.

## 2022-04-11 NOTE — Telephone Encounter (Signed)
Thoracentesis scheduled November 29 @ 11:30 at [facility] need to arrive at [place] patient and left message requesting a call back to review date and time.

## 2022-04-11 NOTE — Progress Notes (Signed)
[place]   [phone] [phone]   Clinic Follow up Note   Patient Care Team: [name], MD as PCP - General (Internal Medicine) [name], MD as Consulting Physician (Cardiothoracic Surgery) [name], MD as Consulting Physician (Urology) [name], MD as Consulting Physician (Hematology) [name], MD as Consulting Physician (Radiation Oncology) Hayden Pedro, PA-C as Physician Assistant (Radiation Oncology)  Date of Service:  04/11/2022  CHIEF COMPLAINT: f/u of recurrent lung cancer, h/o breast cancer  CURRENT THERAPY:  Surveillance  ASSESSMENT & PLAN:  Melissa Snyder is a 88 y.o. post-hysterectomy female with   1. Stage I left lung adenocarcinoma, pT1aN0M0 in 2016, LLL recurrence in 06/2017, LUL recurrence in 10/2020 -initially diagnosed in 11/2014, s/p LLL segmentectomy by Dr. Roxan Hockey, pathology showing stage I adenocarcinoma -She had cancer recurrence in LLL in 06/2017 and in LUL in 10/2020. She was treated with radiation in 07/2017 and in 11/2020 with Dr. Lisbeth Renshaw.  -surveillance chest CT on 04/06/22 showed NED. Also noted was a small left pleural effusion, increased compared to prior.  -I reviewed the results and images with her today. I recommend thoracentesis; I ordered today. I explained the procedure and discussed the possibility that it may not be performed if there are not enough fluids. -she is clinically stable. Labs from 11/1 reviewed, overall WNL. Physical exam showed audible fluids in the left lung, otherwise unremarkable. -Due to her multiple recurrence, will continue CT scan every 6 months for a few more years    2. Right breast invasive ductal carcinoma, pT1b N0 M0 stage IA, ER positive PR positive and HER-2 negative. -diagnosed in 05/2014, s/p right lumpectomy and adjuvant radiation. She declined antiestrogens due to cost and concern with side effects.  -most recent mammogram 09/12/21 was  negative. -She is clinically stable from a breast standpoint. Physical exam was unremarkable. There is no clinical concern for recurrence. -Continue Surveillance.    3. Benign meningiomas -s/p gamma knife treatment in 02/2014 -followed by Dr. Salomon Fick -last brain MRI 06/04/19 was stable.     Plan: -thoracentesis to be done in 3 weeks to rule out malignant effusion  -f/u in 6 months with lab and CT chest a week before   No problem-specific Assessment & Plan notes found for this encounter.   SUMMARY OF ONCOLOGIC HISTORY: Oncology History Overview Note   Cancer Staging  Breast cancer of upper-outer quadrant of right female breast [facility]) Staging form: Breast, AJCC 7th Edition - Clinical stage from 05/19/2014: Stage IA (T1b, N0, M0) - Signed by Holley Bouche, NP on 11/17/2015 - Pathologic stage from 10/17/2014: Stage IA (T1b, N0, cM0) - Signed by [name], MD on 10/17/2014  Primary cancer of left lower lobe of lung [facility]) Staging form: Lung, AJCC 7th Edition - Clinical stage from 12/14/2014: Stage IA (T1a, N0, M0) - Unsigned    Breast cancer of upper-outer quadrant of right female breast ([place])  05/04/2014 Initial Diagnosis   Breast cancer   05/19/2014 Imaging   Diagnostic mammogram and ultrasound showed a 7 mm irregular suspicious mass located in the right breast at the 10:00 position, 7 cm from the nipple. Otherwise negative.   05/19/2014 Initial Biopsy   Right breast biopsy (UOQ): IDC, grade 2.    05/19/2014 Receptors her2   ER 100% positive, PR 35% positive, HER-2 negative, Ki67 8%   06/07/2014 Miscellaneous   Genetic testing normal.  APC, ATM, BARD1, BMPR1A, BRCA1, BRCA2, BRIP1, CDH1, CDK4, CDKN2A, CHEK2, EPCAM, GREM1,  MLH1, MRE11A, MSH2, MSH6, MUTYH, NBN, NF1, PALB2, PMS2, POLD1, POLE, PTEN, RAD50, RAD51D, SMAD4, SMARCA4, STK11, and TP53 tested.    07/23/2014 Surgery   Right breast lumpectomy with SLNB Lucia Gaskins) showed invasive ductal carcinoma & DCIS; negative surgical  margins.   07/23/2014 Pathology Results   Invasive ductal carcinoma, tumor size 0.8 cm, grade 1, no lymphovascular invasion, margins were negative, 2 sentinel lymph nodes were negative. (+) DCIS.   07/23/2014 Pathologic Stage   pT1b, pN0: Stage IA    09/16/2014 - 10/07/2014 Radiation Therapy   XRT completed Pablo Ledger). Right breast/ 42.72 Gy at 2.67 Gy per fraction x 21 fractions.     Anti-estrogen oral therapy   Aromasin prescribed in 01/2015 but Patient declines anti-estrogen therapy at this time d/t high co-pay of medication and possible side effects of AI therapy.    05/02/2015 Mammogram   Diagnostic TOMO bilat mammogram: No evidence of malignancy in either breast. Lumpectomy changes in right breast. Diagnostic mammo suggested in 1 year.    Primary cancer of left lower lobe of lung ([place])  10/25/2013 Imaging   CXR done for stroke protocol. No acute chest findings. Chronic lung disease with biapical scarring. Developing nodule in (L) apex cannot be excluded based on exam, athough may be d/t overlap of osseous structures.    10/27/2013 Imaging   CXR: Nodular opacity in mid (L) apex and appears more prominent than prior study. Area that appeared masslike in LUL near apex on recent CXR not seen at this time.  Underlying emphysema present. No edema or consolidation   11/12/2013 Imaging   CT chest: At left lung apex, there is asymmetric pleural parenchymal scarring. No discrete lesion.  In superior segment of LLL, there are 1 dominant ground-glass opacity measuring 16 mm. 2 addt'l ground-glass opacities appreciated. F/U with CT in 3 months   02/11/2014 Imaging   CT chest: Several small bilat faint nodular densities, largest measures 1.3 cm over superior segment LLL. No new nodules, no adenopathy. Cannot exclude metastatic disease given pt's h/o melanoma. Recommend PET-CT   02/23/2014 PET scan   Persistent semi-solid nodule in superior segment LLL with low metabolic activity. Moderately metabolic  subcarinal & hilar LN are likely reactive.    05/25/2014 Imaging   CT chest: Persistent & similar size dominant LLL sub-solid nodule. Adenocarcinoma is considered. Biopsy or resection strongly considered. Additional scattered small ground-glass nodules stable.    06/02/2014 Imaging   MRI brain: Grossly stable appearance of left planum sphenoidale meningioma measuring 20 x 14 mm. Meningioma does not extend to left orbital apex & contacts left optic nerve. Stable right posterior clinoid meningioma .    09/27/2014 Imaging   CT chest: Further enlargment of central solid component in sub-solid LLL pulm nodule. Findings remain concerning for adenoca. Additional scattered small bilat ground-glass nodules and biapical scarring stable. No adenopathy.     11/25/2014 Surgery   Video bronchoscopy with endobronchial ultrasound; Video assisted thoracoscopy; Thoracoscopic LLL superior segmentectomy; Mediastinal lymph node dissection; Cyro intercostal nerve block Roxan Hockey)   11/25/2014 Pathology Results   Superior segment lung resection (LLL). Adenocarcinoma, well-diff, spanning 1.5 cm. Surgical margins negative. 5 LNs negative.    11/25/2014 Pathologic Stage   pT1a, pN0: Stage IA    11/25/2014 Initial Diagnosis   Primary cancer of left lower lobe of lung ([place])   05/31/2015 Imaging   CT chest: No evidence of local recurrent at superior segmentectomy in LLL. New small nodular focus of consolidation surrounding thin parenchymal band. Additional sub-cm ground-glass  and solid nodules favored to be benign. No thoracic adenopathy   08/11/2015 Imaging   CT chest: No evidence of residual or recurrent tumor in LLL. Stable small nodular focus in LLL with thin surrounding parenchymal band, favored to be benign.  Multiple small ground-glass nodules unchanged.    06/19/2017 Pathology Results   Diagnosis 1. Lung, biopsy, Left lower lobe - ADENOCARCINOMA. - SEE COMMENT. 2. Lung, biopsy, Left lower lobe - BENIGN LUNG  PARENCHYMA. - THERE IS NO EVIDENCE OF MALIGNANCY.   07/09/2017 - 07/22/2017 Radiation Therapy   Radiation treatment dates: 07/09/17-07/22/17 by Dr. Lisbeth Renshaw    Site/dose:  Left lung/ 50 Gy in 10 fractions   Beams/energy:  IMRT/ 6X   Narrative: The patient tolerated radiation treatment relatively well. She denied pain, fatigue, or difficulty swallowing. Skin to the treatment area appears warm, dry, and normal in color.   06/18/2019 Imaging   CT Chest  IMPRESSION: 1. There are 2 round solid nodules within the left lower lobe which have increased in size when compared with the previous exam, suspicious. Additionally, along the area of postsurgical change around the oblique fissure of the left lung there is progressive thickening with persistent loculated fluid. Cannot rule out local tumor recurrence along the suture line. 2. No significant change in the appearance of bilateral upper lobe sub solid lung nodules. 3. Aortic Atherosclerosis (ICD10-I70.0). Coronary artery calcifications.     07/07/2019 PET scan   IMPRESSION: 1. No evidence of hypermetabolic local tumor recurrence in the left lung. Low level nonfocal uptake along the segmentectomy site in the left lower lobe favors post treatment change. Continued chest CT surveillance warranted. 2. Two small solid left lower lobe pulmonary nodules, largest 0.7 cm, below PET resolution. Given the growth of these nodules since 10/03/2018 chest CT, metastatic disease remains on the differential. Close chest CT surveillance recommended. 3. Nonspecific hypermetabolism within nonenlarged subcarinal and bilateral hilar lymph nodes, not substantially changed since 03/04/2017 PET-CT study, favoring reactive uptake. 4. No hypermetabolic distant metastatic disease. 5. Chronic findings include: Aortic Atherosclerosis (ICD10-I70.0). Subcentimeter ground-glass pulmonary nodules in the upper lobes are unchanged and are below PET resolution. Moderate  sigmoid diverticulosis. Chronic right maxillary sinusitis.   04/13/2020 Imaging   CT Chest  IMPRESSION: 1. Primarily similar appearance of the lungs compared to 10/14/2019. Left sided surgical sutures with similar solid and subsolid pulmonary nodules as detailed above. A right middle lobe pleural based 5 mm nodule is new since the prior exam, warranting follow-up attention. 2. No thoracic adenopathy. 3. Aortic atherosclerosis (ICD10-I70.0), coronary artery atherosclerosis and emphysema (ICD10-J43.9).   10/10/2020 Imaging   CT chest  IMPRESSION: Postsurgical and post radiation changes in the lungs bilaterally, as above.   12 x 10 mm left lower lobe nodule, mildly progressive. Additional bilobed nodule in the posterior left lower lobe, also favored to be progressive. PET-CT is suggested for further evaluation.   Aortic Atherosclerosis (ICD10-I70.0) and Emphysema (ICD10-J43.9).   11/22/2020 - 12/02/2020 Radiation Therapy   Site Technique Total Dose (Gy) Dose per Fx (Gy) Completed Fx Beam Energies  Lung, Left: Lung_Lt_LLL IMRT 60/60 12 5/5 6XFFF     05/22/2021 Imaging   EXAM: CT CHEST WITHOUT CONTRAST  IMPRESSION: 1. Interval development of a new area of volume loss, ground-glass opacity, and bronchiectasis in the posterior left lower lobe. Imaging features are compatible with radiation therapy. 2. The posterior left lung nodules, including index 12 x 10 mm nodule described previously have become incorporated into the post treatment change on the  current study. 3. Interval resolution of the posterior right middle lobe nodularity seen previously. 4. Other scattered areas of consolidative opacity or similar. 5. Trace left pleural effusion. 6. Aortic Atherosclerosis (ICD10-I70.0).      INTERVAL HISTORY:  Melissa Snyder is here for a follow up of lung and breast cancers. She was last seen by me on 10/09/21. She presents to the clinic alone. She reports she is doing well overall.  She tells me she recently strained her arm and saw her PCP.   All other systems were reviewed with the patient and are negative.  MEDICAL HISTORY:  Past Medical History:  Diagnosis Date   Adenocarcinoma ([place])    recurrent/notes 07/03/2017   Blind left eye 10/2013   cva   Breast cancer ([place]) 05/2014   ER+/PR+/Her2-     LUNG CANCER   Bundle branch block left    Chest pain    Family history of bladder cancer    Family history of breast cancer    Family history of prostate cancer    GERD (gastroesophageal reflux disease)    History of blood transfusion    age 32   Hyperlipidemia    Hypertension    Hypothyroid    Lung nodule    Melanoma in situ of face ([place]) JUNE 2015   LEFT CHEEK   Meningioma ([place])    brain lining   Migraine    reports only having one   Osteoarthritis    Personal history of radiation therapy    Radiation 09/16/14-10/07/14   Right  Breast  21 fractions   Skin cancer    Stroke ([place]) 2015   Blind in left eye as a result    SURGICAL HISTORY: Past Surgical History:  Procedure Laterality Date   ABDOMINAL HYSTERECTOMY  ~ 1997   APPENDECTOMY     age 79   BILATERAL SALPINGOOPHORECTOMY     BLADDER REPAIR  2009   cystocele   BRAIN MENINGIOMA EXCISION     Gamma knife to Meningioma in brain lining   BREAST BIOPSY     BREAST LUMPECTOMY Right 07/23/2014   invasive ductal   BUNIONECTOMY     bilateral great toe   CARDIAC CATHETERIZATION  2004   CHOLECYSTECTOMY  ~ [place] BLOCK Left 11/25/2014   Procedure: CRYO INTERCOSTAL NERVE BLOCK;  Surgeon: [name], MD;  Location: [place];  Service: Thoracic;  Laterality: Left;   CT RADIATION THERAPY GUIDE     Gamma radiation -lt frontal-[facility]   DILATION AND CURETTAGE OF UTERUS     X 2   HIP ARTHROPLASTY Left 07/04/2017   Procedure: ARTHROPLASTY BIPOLAR HIP (HEMIARTHROPLASTY);  Surgeon: Tania Ade, MD;  Location: [place];  Service: Orthopedics;  Laterality: Left;    MELANOMA EXCISION Left 11/05/23, 11/30/13   CHEEK   SEGMENTECOMY Left 11/25/2014   Procedure: LEFT LOWER LOBE SEGMENTECTOMY;  Surgeon: [name], MD;  Location: [place];  Service: Thoracic;  Laterality: Left;   VIDEO ASSISTED THORACOSCOPY Left 11/25/2014   Procedure: VIDEO ASSISTED THORACOSCOPY;  Surgeon: [name], MD;  Location: [place];  Service: Thoracic;  Laterality: Left;   VIDEO BRONCHOSCOPY WITH ENDOBRONCHIAL NAVIGATION N/A 06/19/2017   Procedure: VIDEO BRONCHOSCOPY;  Surgeon: [name], MD;  Location: [place];  Service: Thoracic;  Laterality: N/A;   VIDEO BRONCHOSCOPY WITH ENDOBRONCHIAL ULTRASOUND N/A 11/25/2014   Procedure: VIDEO BRONCHOSCOPY WITH ENDOBRONCHIAL ULTRASOUND;  Surgeon: [name], MD;  Location: [facility];  Service: Thoracic;  Laterality: N/A;    I have reviewed the social history and family history with the patient and they are unchanged from previous note.  ALLERGIES:  is allergic to beef-derived products, other, chocolate, ibuprofen, niacin and related, and tramadol.  MEDICATIONS:  Current Outpatient Medications  Medication Sig Dispense Refill   aspirin 81 MG EC tablet      atenolol (TENORMIN) 50 MG tablet Take 50 mg by mouth at bedtime.      b complex vitamins capsule Take 1 capsule by mouth daily.     BINAXNOW COVID-19 AG HOME TEST KIT See admin instructions.     calcium carbonate (OSCAL) 1500 (600 Ca) MG TABS tablet      calcium-vitamin D (OSCAL WITH D) 500-200 MG-UNIT per tablet Take 1 tablet by mouth at bedtime.      cephALEXin (KEFLEX) 500 MG capsule Take 1 capsule (500 mg total) by mouth 2 (two) times daily. 10 capsule 0   Cholecalciferol (VITAMIN D3) 1000 units CAPS Take by mouth daily.     denosumab (PROLIA) 60 MG/ML SOSY injection      Digestive Enzymes (ENZYME DIGEST PO) Take 5 mLs by mouth 3 (three) times daily with meals.     fish oil-omega-3 fatty acids 1000 MG capsule Take 1 g by mouth daily.      FLUZONE HIGH-DOSE  QUADRIVALENT 0.7 ML SUSY      levothyroxine (SYNTHROID, LEVOTHROID) 112 MCG tablet Take 112 mcg by mouth daily before breakfast.      lisinopril (PRINIVIL,ZESTRIL) 10 MG tablet Take 1 tablet (10 mg total) by mouth daily. (Patient taking differently: Take 10 mg by mouth 2 (two) times daily.) 30 tablet 1   meclizine (ANTIVERT) 12.5 MG tablet Take 12.5 mg by mouth 3 (three) times daily as needed for dizziness.     metroNIDAZOLE (FLAGYL) 500 MG tablet Take 1 tablet (500 mg total) by mouth 2 (two) times daily. One po bid x 7 days 14 tablet 0   Multiple Vitamin (MULTIVITAMIN) tablet Take 1 tablet by mouth every morning.      Omega-3 1000 MG CAPS Take 1 g by mouth daily.     Polyvinyl Alcohol-Povidone (REFRESH OP) Apply 1 drop to eye as needed (DRY EYES).      Triamcinolone Acetonide (NASACORT AQ NA) Place 1 spray into the nose at bedtime as needed (CONGESTION).     vitamin E 180 MG (400 UNITS) capsule Take 400 Units by mouth every Monday, Wednesday, and Friday. Mon Wed Fri     No current facility-administered medications for this visit.    PHYSICAL EXAMINATION: ECOG PERFORMANCE STATUS: 0 - Asymptomatic  Vitals:   04/11/22 1121  BP: (!) 174/75  Pulse: 73  Resp: 18  Temp: 98 F (36.7 C)  SpO2: 100%   Wt Readings from Last 3 Encounters:  04/11/22 111 lb 12.8 oz (50.7 kg)  10/09/21 116 lb 3.2 oz (52.7 kg)  06/23/21 118 lb (53.5 kg)     GENERAL:alert, no distress and comfortable SKIN: skin color, texture, turgor are normal, no rashes or significant lesions EYES: normal, Conjunctiva are pink and non-injected, sclera clear  NECK: supple, thyroid normal size, non-tender, without nodularity LYMPH:  no palpable lymphadenopathy in the cervical, axillary LUNGS: clear to auscultation and percussion with normal breathing effort, (+) mild fluids present in left lung HEART: regular rate & rhythm and no murmurs and no lower extremity edema ABDOMEN:abdomen soft, non-tender and normal bowel  sounds Musculoskeletal:no cyanosis of  digits and no clubbing  NEURO: alert & oriented x 3 with fluent speech, no focal motor/sensory deficits BREAST: No palpable mass, nodules or adenopathy bilaterally. Breast exam benign.   LABORATORY DATA:  I have reviewed the data as listed    Latest Ref Rng & Units 04/04/2022   10:00 AM 09/18/2021   10:39 AM 06/23/2021    6:35 PM  CBC  WBC 4.0 - 10.5 K/uL 3.9  4.6  8.4   Hemoglobin 12.0 - 15.0 g/dL 13.2  11.9  12.6   Hematocrit 36.0 - 46.0 % 39.4  36.3  38.7   Platelets 150 - 400 K/uL 151  155  153         Latest Ref Rng & Units 04/04/2022   10:00 AM 09/18/2021   10:39 AM 06/23/2021    6:35 PM  CMP  Glucose 70 - 99 mg/dL 137  109  114   BUN 8 - 23 mg/dL _0 Creatinine 0.44 - 1.00 mg/dL 0.74  0.72  0.72   Sodium 135 - 145 mmol/L 140  142  138   Potassium 3.5 - 5.1 mmol/L 4.2  3.9  4.2   Chloride 98 - 111 mmol/L 103  105  103   CO2 22 - 32 mmol/L 32  32  27   Calcium 8.9 - 10.3 mg/dL 9.3  9.1  9.2   Total Protein 6.5 - 8.1 g/dL 7.3  6.9  7.3   Total Bilirubin 0.3 - 1.2 mg/dL 0.6  0.5  0.5   Alkaline Phos 38 - 126 U/L 96  112  111   AST 15 - 41 U/L _1 ALT 0 - 44 U/L _2 RADIOGRAPHIC STUDIES: I have personally reviewed the radiological images as listed and agreed with the findings in the report. No results found.    Orders Placed This Encounter  Procedures   US THORACENTESIS ASP PLEURAL SPACE W/IMG GUIDE    Standing Status:   Future    Standing Expiration Date:   04/11/2023    Order Specific Question:   Are labs required for specimen collection?    Answer:   Yes    Order Specific Question:   Lab orders requested (DO NOT place separate lab orders, these will be automatically ordered during procedure specimen collection):    Answer:   Cytology - Non Pap    Order Specific Question:   Lab orders requested (DO NOT place separate lab orders, these will be automatically ordered during procedure specimen  collection):    Answer:   Body Fluid Culture    Order Specific Question:   Lab orders requested (DO NOT place separate lab orders, these will be automatically ordered during procedure specimen collection):    Answer:   Gram Stain    Order Specific Question:   Lab orders requested (DO NOT place separate lab orders, these will be automatically ordered during procedure specimen collection):    Answer:   Protein, Pleural Or Peritoneal Fluid    Order Specific Question:   Reason for Exam (SYMPTOM  OR DIAGNOSIS REQUIRED)    Answer:   rule out malignancy    Order Specific Question:   Preferred imaging location?    Answer:   [facility]   CT CHEST WO CONTRAST    Standing Status:   Future    Standing Expiration Date:   04/12/2023  Order Specific Question:   Preferred imaging location?    Answer:   [facility]    Order Specific Question:   Release to patient    Answer:   Immediate   All questions were answered. The patient knows to call the clinic with any problems, questions or concerns. No barriers to learning was detected. The total time spent in the appointment was 30 minutes.     [name], MD 04/11/2022   I, Wilburn Mylar, am acting as scribe for [name], MD.   I have reviewed the above documentation for accuracy and completeness, and I agree with the above.

## 2022-04-11 NOTE — Telephone Encounter (Signed)
Patient called back and I gave her the time and place of the Ultrasound Thoracentesis. Patient repeated time and understanding.

## 2022-04-27 DIAGNOSIS — E785 Hyperlipidemia, unspecified: Secondary | ICD-10-CM | POA: Diagnosis not present

## 2022-04-27 DIAGNOSIS — M810 Age-related osteoporosis without current pathological fracture: Secondary | ICD-10-CM | POA: Diagnosis not present

## 2022-04-27 DIAGNOSIS — E039 Hypothyroidism, unspecified: Secondary | ICD-10-CM | POA: Diagnosis not present

## 2022-04-27 DIAGNOSIS — R7989 Other specified abnormal findings of blood chemistry: Secondary | ICD-10-CM | POA: Diagnosis not present

## 2022-05-02 ENCOUNTER — Ambulatory Visit (HOSPITAL_COMMUNITY)
Admission: RE | Admit: 2022-05-02 | Discharge: 2022-05-02 | Disposition: A | Discharge status: Home / Self Care | Payer: Medicare HMO | Source: Ambulatory Visit | Attending: Radiology | Admitting: Radiology

## 2022-05-02 ENCOUNTER — Ambulatory Visit (HOSPITAL_COMMUNITY)
Admission: RE | Admit: 2022-05-02 | Discharge: 2022-05-02 | Disposition: A | Discharge status: Home / Self Care | Payer: Medicare HMO | Source: Ambulatory Visit | Attending: Hematology | Admitting: Hematology

## 2022-05-02 DIAGNOSIS — J910 Malignant pleural effusion: Secondary | ICD-10-CM | POA: Diagnosis not present

## 2022-05-02 DIAGNOSIS — Z85118 Personal history of other malignant neoplasm of bronchus and lung: Secondary | ICD-10-CM | POA: Diagnosis not present

## 2022-05-02 DIAGNOSIS — Z853 Personal history of malignant neoplasm of breast: Secondary | ICD-10-CM | POA: Insufficient documentation

## 2022-05-02 DIAGNOSIS — C3490 Malignant neoplasm of unspecified part of unspecified bronchus or lung: Secondary | ICD-10-CM | POA: Diagnosis not present

## 2022-05-02 DIAGNOSIS — J90 Pleural effusion, not elsewhere classified: Secondary | ICD-10-CM | POA: Insufficient documentation

## 2022-05-02 DIAGNOSIS — C3432 Malignant neoplasm of lower lobe, left bronchus or lung: Secondary | ICD-10-CM | POA: Insufficient documentation

## 2022-05-02 DIAGNOSIS — J449 Chronic obstructive pulmonary disease, unspecified: Secondary | ICD-10-CM | POA: Diagnosis not present

## 2022-05-02 LAB — PROTEIN, PLEURAL OR PERITONEAL FLUID: Total protein, fluid: 5.4 g/dL

## 2022-05-02 LAB — GRAM STAIN

## 2022-05-02 MED ORDER — LIDOCAINE HCL 1 % IJ SOLN
INTRAMUSCULAR | Status: AC
  Administered 2022-05-02: 10 mL
  Filled 2022-05-02: qty 20

## 2022-05-02 NOTE — Procedures (Signed)
Ultrasound-guided diagnostic and therapeutic left thoracentesis performed yielding 520 cc of  hazy, amber fluid. No immediate complications. Follow-up chest x-ray pending. The fluid was sent to the lab for preordered studies. EBL none.

## 2022-05-03 DIAGNOSIS — R82998 Other abnormal findings in urine: Secondary | ICD-10-CM | POA: Diagnosis not present

## 2022-05-03 DIAGNOSIS — Z1331 Encounter for screening for depression: Secondary | ICD-10-CM | POA: Diagnosis not present

## 2022-05-03 DIAGNOSIS — I10 Essential (primary) hypertension: Secondary | ICD-10-CM | POA: Diagnosis not present

## 2022-05-03 DIAGNOSIS — Z1339 Encounter for screening examination for other mental health and behavioral disorders: Secondary | ICD-10-CM | POA: Diagnosis not present

## 2022-05-03 DIAGNOSIS — M1990 Unspecified osteoarthritis, unspecified site: Secondary | ICD-10-CM | POA: Diagnosis not present

## 2022-05-03 DIAGNOSIS — Z0000 Encounter for general adult medical examination without abnormal findings: Secondary | ICD-10-CM | POA: Diagnosis not present

## 2022-05-03 DIAGNOSIS — C3490 Malignant neoplasm of unspecified part of unspecified bronchus or lung: Secondary | ICD-10-CM | POA: Diagnosis not present

## 2022-05-03 DIAGNOSIS — M810 Age-related osteoporosis without current pathological fracture: Secondary | ICD-10-CM | POA: Diagnosis not present

## 2022-05-03 DIAGNOSIS — J90 Pleural effusion, not elsewhere classified: Secondary | ICD-10-CM | POA: Diagnosis not present

## 2022-05-03 DIAGNOSIS — D329 Benign neoplasm of meninges, unspecified: Secondary | ICD-10-CM | POA: Diagnosis not present

## 2022-05-03 DIAGNOSIS — B353 Tinea pedis: Secondary | ICD-10-CM | POA: Diagnosis not present

## 2022-05-03 DIAGNOSIS — R918 Other nonspecific abnormal finding of lung field: Secondary | ICD-10-CM | POA: Diagnosis not present

## 2022-05-07 LAB — CULTURE, BODY FLUID W GRAM STAIN -BOTTLE: Culture: NO GROWTH

## 2022-05-18 ENCOUNTER — Other Ambulatory Visit (HOSPITAL_COMMUNITY): Payer: Self-pay | Admitting: *Deleted

## 2022-05-21 ENCOUNTER — Ambulatory Visit (HOSPITAL_COMMUNITY)
Admission: RE | Admit: 2022-05-21 | Discharge: 2022-05-21 | Disposition: A | Discharge status: Home / Self Care | Payer: Medicare HMO | Source: Ambulatory Visit | Attending: Internal Medicine | Admitting: Internal Medicine

## 2022-05-21 DIAGNOSIS — M810 Age-related osteoporosis without current pathological fracture: Secondary | ICD-10-CM | POA: Insufficient documentation

## 2022-05-21 MED ORDER — DENOSUMAB 60 MG/ML ~~LOC~~ SOSY
60.0000 mg | PREFILLED_SYRINGE | Freq: Once | SUBCUTANEOUS | Status: AC
  Administered 2022-05-21: 60 mg via SUBCUTANEOUS

## 2022-05-21 MED ORDER — DENOSUMAB 60 MG/ML ~~LOC~~ SOSY
PREFILLED_SYRINGE | SUBCUTANEOUS | Status: AC
  Filled 2022-05-21: qty 1

## 2022-06-05 ENCOUNTER — Telehealth: Payer: Self-pay

## 2022-06-05 ENCOUNTER — Telehealth: Payer: Self-pay | Admitting: Hematology

## 2022-06-05 NOTE — Telephone Encounter (Addendum)
Called patient and asked if she could come in next week she had no appointments for next week so said it was fine. Sent message to scheduling to call and set it up and call her with time and date.      ----- Message from [name], MD sent at 06/04/2022  3:13 PM EST ----- Please call pt or her family and schedule a OV with me in 1-2 weeks. She has thoracentesis on 11/29 and cytology was positive for metastatic cancer. I did not see the result until now, it's better to discuss the findings with her in person, and I am hoping her family will come with her. She is elderly   Thanks   Krista Blue

## 2022-06-05 NOTE — Telephone Encounter (Signed)
Scheduled appointment per 1/2 scheduling message. Patient is aware.

## 2022-06-06 ENCOUNTER — Other Ambulatory Visit: Payer: Self-pay

## 2022-06-07 ENCOUNTER — Telehealth: Payer: Self-pay | Admitting: Hematology

## 2022-06-07 DIAGNOSIS — C3432 Malignant neoplasm of lower lobe, left bronchus or lung: Secondary | ICD-10-CM | POA: Diagnosis not present

## 2022-06-07 NOTE — Telephone Encounter (Signed)
Left patient a voicemail regarding appointment change

## 2022-06-11 LAB — CYTOLOGY - NON PAP

## 2022-06-12 NOTE — Progress Notes (Unsigned)
[place]   [phone]) [phone] [phone]   Clinic Follow up Note   Patient Care Team: [name], MD as PCP - General (Internal Medicine) [name], MD as Consulting Physician (Cardiothoracic Surgery) [name], MD as Consulting Physician (Urology) [name], MD as Consulting Physician (Hematology) [name], MD as Consulting Physician (Radiation Oncology) Hayden Pedro, PA-C as Physician Assistant (Radiation Oncology) Imaging, [facility] as Consulting Physician (Diagnostic Radiology)  Date of Service:  06/12/2022  CHIEF COMPLAINT: f/u of  recurrent lung cancer, h/o breast cancer    CURRENT THERAPY: Surveillance    ASSESSMENT: *** Melissa Snyder is a 89 y.o. female with   No problem-specific Assessment & Plan notes found for this encounter.  ***   PLAN: {Everything Dr. Burr Medico talks to pt about, including reviewing scans and labs. } -{proceed with ***} -{lab with/without flush and f/u when?}   SUMMARY OF ONCOLOGIC HISTORY: Oncology History Overview Note   Cancer Staging  Breast cancer of upper-outer quadrant of right female breast [facility]) Staging form: Breast, AJCC 7th Edition - Clinical stage from 05/19/2014: Stage IA (T1b, N0, M0) - Signed by Holley Bouche, NP on 11/17/2015 - Pathologic stage from 10/17/2014: Stage IA (T1b, N0, cM0) - Signed by [name], MD on 10/17/2014  Primary cancer of left lower lobe of lung [facility]) Staging form: Lung, AJCC 7th Edition - Clinical stage from 12/14/2014: Stage IA (T1a, N0, M0) - Unsigned    Breast cancer of upper-outer quadrant of right female breast ([place])  05/04/2014 Initial Diagnosis   Breast cancer   05/19/2014 Imaging   Diagnostic mammogram and ultrasound showed a 7 mm irregular suspicious mass located in the right breast at the 10:00 position, 7 cm from the nipple. Otherwise negative.   05/19/2014 Initial Biopsy   Right breast  biopsy (UOQ): IDC, grade 2.    05/19/2014 Receptors her2   ER 100% positive, PR 35% positive, HER-2 negative, Ki67 8%   06/07/2014 Miscellaneous   Genetic testing normal.  APC, ATM, BARD1, BMPR1A, BRCA1, BRCA2, BRIP1, CDH1, CDK4, CDKN2A, CHEK2, EPCAM, GREM1, MLH1, MRE11A, MSH2, MSH6, MUTYH, NBN, NF1, PALB2, PMS2, POLD1, POLE, PTEN, RAD50, RAD51D, SMAD4, SMARCA4, STK11, and TP53 tested.    07/23/2014 Surgery   Right breast lumpectomy with SLNB Lucia Gaskins) showed invasive ductal carcinoma & DCIS; negative surgical margins.   07/23/2014 Pathology Results   Invasive ductal carcinoma, tumor size 0.8 cm, grade 1, no lymphovascular invasion, margins were negative, 2 sentinel lymph nodes were negative. (+) DCIS.   07/23/2014 Pathologic Stage   pT1b, pN0: Stage IA    09/16/2014 - 10/07/2014 Radiation Therapy   XRT completed Pablo Ledger). Right breast/ 42.72 Gy at 2.67 Gy per fraction x 21 fractions.     Anti-estrogen oral therapy   Aromasin prescribed in 01/2015 but Patient declines anti-estrogen therapy at this time d/t high co-pay of medication and possible side effects of AI therapy.    05/02/2015 Mammogram   Diagnostic TOMO bilat mammogram: No evidence of malignancy in either breast. Lumpectomy changes in right breast. Diagnostic mammo suggested in 1 year.    Primary cancer of left lower lobe of lung ([place])  10/25/2013 Imaging   CXR done for stroke protocol. No acute chest findings. Chronic lung disease with biapical scarring. Developing nodule in (L) apex cannot be excluded based on exam, athough may be d/t overlap of osseous structures.    10/27/2013 Imaging   CXR: Nodular opacity in mid (L) apex and  appears more prominent than prior study. Area that appeared masslike in LUL near apex on recent CXR not seen at this time.  Underlying emphysema present. No edema or consolidation   11/12/2013 Imaging   CT chest: At left lung apex, there is asymmetric pleural parenchymal scarring. No discrete lesion.  In  superior segment of LLL, there are 1 dominant ground-glass opacity measuring 16 mm. 2 addt'l ground-glass opacities appreciated. F/U with CT in 3 months   02/11/2014 Imaging   CT chest: Several small bilat faint nodular densities, largest measures 1.3 cm over superior segment LLL. No new nodules, no adenopathy. Cannot exclude metastatic disease given pt's h/o melanoma. Recommend PET-CT   02/23/2014 PET scan   Persistent semi-solid nodule in superior segment LLL with low metabolic activity. Moderately metabolic subcarinal & hilar LN are likely reactive.    05/25/2014 Imaging   CT chest: Persistent & similar size dominant LLL sub-solid nodule. Adenocarcinoma is considered. Biopsy or resection strongly considered. Additional scattered small ground-glass nodules stable.    06/02/2014 Imaging   MRI brain: Grossly stable appearance of left planum sphenoidale meningioma measuring 20 x 14 mm. Meningioma does not extend to left orbital apex & contacts left optic nerve. Stable right posterior clinoid meningioma .    09/27/2014 Imaging   CT chest: Further enlargment of central solid component in sub-solid LLL pulm nodule. Findings remain concerning for adenoca. Additional scattered small bilat ground-glass nodules and biapical scarring stable. No adenopathy.     11/25/2014 Surgery   Video bronchoscopy with endobronchial ultrasound; Video assisted thoracoscopy; Thoracoscopic LLL superior segmentectomy; Mediastinal lymph node dissection; Cyro intercostal nerve block Roxan Hockey)   11/25/2014 Pathology Results   Superior segment lung resection (LLL). Adenocarcinoma, well-diff, spanning 1.5 cm. Surgical margins negative. 5 LNs negative.    11/25/2014 Pathologic Stage   pT1a, pN0: Stage IA    11/25/2014 Initial Diagnosis   Primary cancer of left lower lobe of lung ([place])   05/31/2015 Imaging   CT chest: No evidence of local recurrent at superior segmentectomy in LLL. New small nodular focus of consolidation  surrounding thin parenchymal band. Additional sub-cm ground-glass and solid nodules favored to be benign. No thoracic adenopathy   08/11/2015 Imaging   CT chest: No evidence of residual or recurrent tumor in LLL. Stable small nodular focus in LLL with thin surrounding parenchymal band, favored to be benign.  Multiple small ground-glass nodules unchanged.    06/19/2017 Pathology Results   Diagnosis 1. Lung, biopsy, Left lower lobe - ADENOCARCINOMA. - SEE COMMENT. 2. Lung, biopsy, Left lower lobe - BENIGN LUNG PARENCHYMA. - THERE IS NO EVIDENCE OF MALIGNANCY.   07/09/2017 - 07/22/2017 Radiation Therapy   Radiation treatment dates: 07/09/17-07/22/17 by Dr. Lisbeth Renshaw    Site/dose:  Left lung/ 50 Gy in 10 fractions   Beams/energy:  IMRT/ 6X   Narrative: The patient tolerated radiation treatment relatively well. She denied pain, fatigue, or difficulty swallowing. Skin to the treatment area appears warm, dry, and normal in color.   06/18/2019 Imaging   CT Chest  IMPRESSION: 1. There are 2 round solid nodules within the left lower lobe which have increased in size when compared with the previous exam, suspicious. Additionally, along the area of postsurgical change around the oblique fissure of the left lung there is progressive thickening with persistent loculated fluid. Cannot rule out local tumor recurrence along the suture line. 2. No significant change in the appearance of bilateral upper lobe sub solid lung nodules. 3. Aortic Atherosclerosis (ICD10-I70.0). Coronary artery  calcifications.     07/07/2019 PET scan   IMPRESSION: 1. No evidence of hypermetabolic local tumor recurrence in the left lung. Low level nonfocal uptake along the segmentectomy site in the left lower lobe favors post treatment change. Continued chest CT surveillance warranted. 2. Two small solid left lower lobe pulmonary nodules, largest 0.7 cm, below PET resolution. Given the growth of these nodules since 10/03/2018  chest CT, metastatic disease remains on the differential. Close chest CT surveillance recommended. 3. Nonspecific hypermetabolism within nonenlarged subcarinal and bilateral hilar lymph nodes, not substantially changed since 03/04/2017 PET-CT study, favoring reactive uptake. 4. No hypermetabolic distant metastatic disease. 5. Chronic findings include: Aortic Atherosclerosis (ICD10-I70.0). Subcentimeter ground-glass pulmonary nodules in the upper lobes are unchanged and are below PET resolution. Moderate sigmoid diverticulosis. Chronic right maxillary sinusitis.   04/13/2020 Imaging   CT Chest  IMPRESSION: 1. Primarily similar appearance of the lungs compared to 10/14/2019. Left sided surgical sutures with similar solid and subsolid pulmonary nodules as detailed above. A right middle lobe pleural based 5 mm nodule is new since the prior exam, warranting follow-up attention. 2. No thoracic adenopathy. 3. Aortic atherosclerosis (ICD10-I70.0), coronary artery atherosclerosis and emphysema (ICD10-J43.9).   10/10/2020 Imaging   CT chest  IMPRESSION: Postsurgical and post radiation changes in the lungs bilaterally, as above.   12 x 10 mm left lower lobe nodule, mildly progressive. Additional bilobed nodule in the posterior left lower lobe, also favored to be progressive. PET-CT is suggested for further evaluation.   Aortic Atherosclerosis (ICD10-I70.0) and Emphysema (ICD10-J43.9).   11/22/2020 - 12/02/2020 Radiation Therapy   Site Technique Total Dose (Gy) Dose per Fx (Gy) Completed Fx Beam Energies  Lung, Left: Lung_Lt_LLL IMRT 60/60 12 5/5 6XFFF     05/22/2021 Imaging   EXAM: CT CHEST WITHOUT CONTRAST  IMPRESSION: 1. Interval development of a new area of volume loss, ground-glass opacity, and bronchiectasis in the posterior left lower lobe. Imaging features are compatible with radiation therapy. 2. The posterior left lung nodules, including index 12 x 10 mm nodule described  previously have become incorporated into the post treatment change on the current study. 3. Interval resolution of the posterior right middle lobe nodularity seen previously. 4. Other scattered areas of consolidative opacity or similar. 5. Trace left pleural effusion. 6. Aortic Atherosclerosis (ICD10-I70.0).      INTERVAL HISTORY: *** Melissa Snyder is here for a follow up of recurrent lung cancer, h/o breast cancer   She was last seen by me on 04/11/22 She presents to the clinic     All other systems were reviewed with the patient and are negative.  MEDICAL HISTORY:  Past Medical History:  Diagnosis Date   Adenocarcinoma ([place])    recurrent/notes 07/03/2017   Blind left eye 10/2013   cva   Breast cancer ([place]) 05/2014   ER+/PR+/Her2-     LUNG CANCER   Bundle branch block left    Chest pain    Family history of bladder cancer    Family history of breast cancer    Family history of prostate cancer    GERD (gastroesophageal reflux disease)    History of blood transfusion    age 5   Hyperlipidemia    Hypertension    Hypothyroid    Lung nodule    Melanoma in situ of face ([place]) JUNE 2015   LEFT CHEEK   Meningioma ([place])    brain lining   Migraine    reports only having one   Osteoarthritis  Personal history of radiation therapy    Radiation 09/16/14-10/07/14   Right  Breast  21 fractions   Skin cancer    Stroke ([place]) 2015   Blind in left eye as a result    SURGICAL HISTORY: Past Surgical History:  Procedure Laterality Date   ABDOMINAL HYSTERECTOMY  ~ 1997   APPENDECTOMY     age 16   BILATERAL SALPINGOOPHORECTOMY     BLADDER REPAIR  2009   cystocele   BRAIN MENINGIOMA EXCISION     Gamma knife to Meningioma in brain lining   BREAST BIOPSY     BREAST LUMPECTOMY Right 07/23/2014   invasive ductal   BUNIONECTOMY     bilateral great toe   CARDIAC CATHETERIZATION  2004   CHOLECYSTECTOMY  ~ 1997   COLONOSCOPY     CRYO INTERCOSTAL NERVE BLOCK Left 11/25/2014    Procedure: CRYO INTERCOSTAL NERVE BLOCK;  Surgeon: [name], MD;  Location: [place];  Service: Thoracic;  Laterality: Left;   CT RADIATION THERAPY GUIDE     Gamma radiation -lt frontal-[facility]   DILATION AND CURETTAGE OF UTERUS     X 2   HIP ARTHROPLASTY Left 07/04/2017   Procedure: ARTHROPLASTY BIPOLAR HIP (HEMIARTHROPLASTY);  Surgeon: Tania Ade, MD;  Location: [place];  Service: Orthopedics;  Laterality: Left;   MELANOMA EXCISION Left 11/05/23, 11/30/13   CHEEK   SEGMENTECOMY Left 11/25/2014   Procedure: LEFT LOWER LOBE SEGMENTECTOMY;  Surgeon: [name], MD;  Location: [place];  Service: Thoracic;  Laterality: Left;   VIDEO ASSISTED THORACOSCOPY Left 11/25/2014   Procedure: VIDEO ASSISTED THORACOSCOPY;  Surgeon: [name], MD;  Location: [place];  Service: Thoracic;  Laterality: Left;   VIDEO BRONCHOSCOPY WITH ENDOBRONCHIAL NAVIGATION N/A 06/19/2017   Procedure: VIDEO BRONCHOSCOPY;  Surgeon: [name], MD;  Location: [place];  Service: Thoracic;  Laterality: N/A;   VIDEO BRONCHOSCOPY WITH ENDOBRONCHIAL ULTRASOUND N/A 11/25/2014   Procedure: VIDEO BRONCHOSCOPY WITH ENDOBRONCHIAL ULTRASOUND;  Surgeon: [name], MD;  Location: [place];  Service: Thoracic;  Laterality: N/A;    I have reviewed the social history and family history with the patient and they are unchanged from previous note.  ALLERGIES:  is allergic to beef-derived products, other, chocolate, ibuprofen, niacin and related, and tramadol.  MEDICATIONS:  Current Outpatient Medications  Medication Sig Dispense Refill   aspirin 81 MG EC tablet      atenolol (TENORMIN) 50 MG tablet Take 50 mg by mouth at bedtime.      b complex vitamins capsule Take 1 capsule by mouth daily.     BINAXNOW COVID-19 AG HOME TEST KIT See admin instructions.     calcium carbonate (OSCAL) 1500 (600 Ca) MG TABS tablet      calcium-vitamin D (OSCAL WITH D) 500-200 MG-UNIT per tablet Take 1 tablet by mouth at  bedtime.      cephALEXin (KEFLEX) 500 MG capsule Take 1 capsule (500 mg total) by mouth 2 (two) times daily. 10 capsule 0   Cholecalciferol (VITAMIN D3) 1000 units CAPS Take by mouth daily.     denosumab (PROLIA) 60 MG/ML SOSY injection      Digestive Enzymes (ENZYME DIGEST PO) Take 5 mLs by mouth 3 (three) times daily with meals.     fish oil-omega-3 fatty acids 1000 MG capsule Take 1 g by mouth daily.      FLUZONE HIGH-DOSE QUADRIVALENT 0.7 ML SUSY      levothyroxine (SYNTHROID, LEVOTHROID) 112 MCG tablet Take  112 mcg by mouth daily before breakfast.      lisinopril (PRINIVIL,ZESTRIL) 10 MG tablet Take 1 tablet (10 mg total) by mouth daily. (Patient taking differently: Take 10 mg by mouth 2 (two) times daily.) 30 tablet 1   meclizine (ANTIVERT) 12.5 MG tablet Take 12.5 mg by mouth 3 (three) times daily as needed for dizziness.     metroNIDAZOLE (FLAGYL) 500 MG tablet Take 1 tablet (500 mg total) by mouth 2 (two) times daily. One po bid x 7 days 14 tablet 0   Multiple Vitamin (MULTIVITAMIN) tablet Take 1 tablet by mouth every morning.      Omega-3 1000 MG CAPS Take 1 g by mouth daily.     Polyvinyl Alcohol-Povidone (REFRESH OP) Apply 1 drop to eye as needed (DRY EYES).      Triamcinolone Acetonide (NASACORT AQ NA) Place 1 spray into the nose at bedtime as needed (CONGESTION).     vitamin E 180 MG (400 UNITS) capsule Take 400 Units by mouth every Monday, Wednesday, and Friday. Mon Wed Fri     No current facility-administered medications for this visit.    PHYSICAL EXAMINATION: ECOG PERFORMANCE STATUS: {CHL ONC ECOG PS:[phone]}  There were no vitals filed for this visit. Wt Readings from Last 3 Encounters:  04/11/22 111 lb 12.8 oz (50.7 kg)  10/09/21 116 lb 3.2 oz (52.7 kg)  06/23/21 118 lb (53.5 kg)    {Only keep what was examined. If exam not performed, can use .CEXAM } GENERAL:alert, no distress and comfortable SKIN: skin color, texture, turgor are normal, no rashes or significant  lesions EYES: normal, Conjunctiva are pink and non-injected, sclera clear {OROPHARYNX:no exudate, no erythema and lips, buccal mucosa, and tongue normal}  NECK: supple, thyroid normal size, non-tender, without nodularity LYMPH:  no palpable lymphadenopathy in the cervical, axillary {or inguinal} LUNGS: clear to auscultation and percussion with normal breathing effort HEART: regular rate & rhythm and no murmurs and no lower extremity edema ABDOMEN:abdomen soft, non-tender and normal bowel sounds Musculoskeletal:no cyanosis of digits and no clubbing  NEURO: alert & oriented x 3 with fluent speech, no focal motor/sensory deficits  LABORATORY DATA:  I have reviewed the data as listed    Latest Ref Rng & Units 04/04/2022   10:00 AM 09/18/2021   10:39 AM 06/23/2021    6:35 PM  CBC  WBC 4.0 - 10.5 K/uL 3.9  4.6  8.4   Hemoglobin 12.0 - 15.0 g/dL 13.2  11.9  12.6   Hematocrit 36.0 - 46.0 % 39.4  36.3  38.7   Platelets 150 - 400 K/uL 151  155  153         Latest Ref Rng & Units 04/04/2022   10:00 AM 09/18/2021   10:39 AM 06/23/2021    6:35 PM  CMP  Glucose 70 - 99 mg/dL 137  109  114   BUN 8 - 23 mg/dL 18  19  22    Creatinine 0.44 - 1.00 mg/dL 0.74  0.72  0.72   Sodium 135 - 145 mmol/L 140  142  138   Potassium 3.5 - 5.1 mmol/L 4.2  3.9  4.2   Chloride 98 - 111 mmol/L 103  105  103   CO2 22 - 32 mmol/L 32  32  27   Calcium 8.9 - 10.3 mg/dL 9.3  9.1  9.2   Total Protein 6.5 - 8.1 g/dL 7.3  6.9  7.3   Total Bilirubin 0.3 - 1.2 mg/dL 0.6  0.5  0.5   Alkaline Phos 38 - 126 U/L 96  112  111   AST 15 - 41 U/L 23  18  19    ALT 0 - 44 U/L 20  14  14        RADIOGRAPHIC STUDIES: I have personally reviewed the radiological images as listed and agreed with the findings in the report. No results found.    No orders of the defined types were placed in this encounter.  All questions were answered. The patient knows to call the clinic with any problems, questions or concerns. No barriers to  learning was detected. The total time spent in the appointment was {CHL ONC TIME VISIT - VEZBM:[id number]}.     Baldemar Friday, CMA 06/12/2022   I, Audry Riles, CMA, am acting as scribe for [name], MD.   {Add scribe attestation statement}

## 2022-06-12 NOTE — Assessment & Plan Note (Addendum)
Stage I left lung adenocarcinoma, pT1aN0M0 in 2016, LLL recurrence in 06/2017, LUL recurrence in 10/2020, malignant left pleural effusion in 04/2022 -initially diagnosed in 11/2014, s/p LLL segmentectomy by Dr. Roxan Hockey, pathology showing stage I adenocarcinoma -She had cancer recurrence in LLL in 06/2017 and in LUL in 10/2020. She was treated with radiation in 07/2017 and in 11/2020 with Dr. Lisbeth Renshaw.  -surveillance chest CT on 04/06/22 showed stable lung change, no evidence of recurrence. Also noted was a small left pleural effusion, increased compared to prior.  -She underwent left thoracentesis on 05/02/22 and unfortunately cytology was positive for adenocarcinoma, TTF-1 positive, consistent with recurrent lung adenocarcinoma. -I discussed this is unfortunately not curable disease, I will obtain a PET scan to see if there are any other distant metastasis, and brain MRI -I will request molecular testing [facility] and PD-L1 on her cytology or previous surgical sample, to see if she is a candidate for targeted therapy or immunotherapy.  She has never smoked, her tumor may contain EGFR mutation although ALK translocation.  Due to her advanced age, she is not a good candidate for cytotoxic chemotherapy. She agrees with the plan  -I encouraged her to discuss her recent findings with her family.  She will talk to her husband, but does not want to tell her children until her next visit when we have more treatment information.

## 2022-06-12 NOTE — Assessment & Plan Note (Signed)
pT1b N0 M0 stage IA, ER positive PR positive and HER-2 negative. -diagnosed in 05/2014, s/p right lumpectomy and adjuvant radiation. She declined antiestrogens due to cost and concern with side effects.  -most recent mammogram 09/12/21 was negative. -She is clinically stable from a breast standpoint. Physical exam was unremarkable. There is no clinical concern for recurrence. -Continue Surveillance.

## 2022-06-13 ENCOUNTER — Encounter: Payer: Self-pay | Admitting: Hematology

## 2022-06-13 ENCOUNTER — Other Ambulatory Visit: Payer: Self-pay

## 2022-06-13 ENCOUNTER — Inpatient Hospital Stay: Payer: Medicare HMO

## 2022-06-13 ENCOUNTER — Inpatient Hospital Stay: Payer: Medicare HMO | Attending: Hematology | Admitting: Hematology

## 2022-06-13 VITALS — BP 163/70 | HR 72 | Temp 98.2°F | Resp 18 | Ht 65.0 in | Wt 114.9 lb

## 2022-06-13 DIAGNOSIS — J910 Malignant pleural effusion: Secondary | ICD-10-CM | POA: Diagnosis not present

## 2022-06-13 DIAGNOSIS — Z85828 Personal history of other malignant neoplasm of skin: Secondary | ICD-10-CM | POA: Diagnosis not present

## 2022-06-13 DIAGNOSIS — C3432 Malignant neoplasm of lower lobe, left bronchus or lung: Secondary | ICD-10-CM

## 2022-06-13 DIAGNOSIS — Z170 Estrogen receptor positive status [ER+]: Secondary | ICD-10-CM

## 2022-06-13 DIAGNOSIS — Z853 Personal history of malignant neoplasm of breast: Secondary | ICD-10-CM | POA: Diagnosis not present

## 2022-06-13 DIAGNOSIS — C3412 Malignant neoplasm of upper lobe, left bronchus or lung: Secondary | ICD-10-CM | POA: Diagnosis not present

## 2022-06-13 DIAGNOSIS — C50411 Malignant neoplasm of upper-outer quadrant of right female breast: Secondary | ICD-10-CM

## 2022-06-13 LAB — CBC WITH DIFFERENTIAL (CANCER CENTER ONLY)
Abs Immature Granulocytes: 0.01 10*3/uL (ref 0.00–0.07)
Basophils Absolute: 0 10*3/uL (ref 0.0–0.1)
Basophils Relative: 1 %
Eosinophils Absolute: 0.1 10*3/uL (ref 0.0–0.5)
Eosinophils Relative: 2 %
HCT: 38.6 % (ref 36.0–46.0)
Hemoglobin: 12.8 g/dL (ref 12.0–15.0)
Immature Granulocytes: 0 %
Lymphocytes Relative: 18 %
Lymphs Abs: 0.8 10*3/uL (ref 0.7–4.0)
MCH: 29.8 pg (ref 26.0–34.0)
MCHC: 33.2 g/dL (ref 30.0–36.0)
MCV: 90 fL (ref 80.0–100.0)
Monocytes Absolute: 0.4 10*3/uL (ref 0.1–1.0)
Monocytes Relative: 9 %
Neutro Abs: 3.2 10*3/uL (ref 1.7–7.7)
Neutrophils Relative %: 70 %
Platelet Count: 160 10*3/uL (ref 150–400)
RBC: 4.29 MIL/uL (ref 3.87–5.11)
RDW: 14.4 % (ref 11.5–15.5)
WBC Count: 4.6 10*3/uL (ref 4.0–10.5)
nRBC: 0 % (ref 0.0–0.2)

## 2022-06-13 LAB — CMP (CANCER CENTER ONLY)
ALT: 18 U/L (ref 0–44)
AST: 21 U/L (ref 15–41)
Albumin: 4.2 g/dL (ref 3.5–5.0)
Alkaline Phosphatase: 95 U/L (ref 38–126)
Anion gap: 3 — ABNORMAL LOW (ref 5–15)
BUN: 18 mg/dL (ref 8–23)
CO2: 32 mmol/L (ref 22–32)
Calcium: 9.3 mg/dL (ref 8.9–10.3)
Chloride: 104 mmol/L (ref 98–111)
Creatinine: 0.61 mg/dL (ref 0.44–1.00)
GFR, Estimated: >60 mL/min (ref >60)
Glucose, Bld: 94 mg/dL (ref 70–99)
Potassium: 4 mmol/L (ref 3.5–5.1)
Sodium: 139 mmol/L (ref 135–145)
Total Bilirubin: 0.6 mg/dL (ref 0.3–1.2)
Total Protein: 7 g/dL (ref 6.5–8.1)

## 2022-06-14 ENCOUNTER — Ambulatory Visit: Payer: Medicare HMO | Admitting: Hematology

## 2022-06-19 DIAGNOSIS — C3432 Malignant neoplasm of lower lobe, left bronchus or lung: Secondary | ICD-10-CM | POA: Diagnosis not present

## 2022-06-19 NOTE — Progress Notes (Signed)
[place]   [phone] [phone]   Clinic Follow up Note   Patient Care Team: [name], MD as PCP - General (Internal Medicine) [name], MD as Consulting Physician (Cardiothoracic Surgery) [name], MD as Consulting Physician (Urology) [name], MD as Consulting Physician (Hematology) [name], MD as Consulting Physician (Radiation Oncology) Hayden Pedro, PA-C as Physician Assistant (Radiation Oncology) Imaging, [facility] as Consulting Physician (Diagnostic Radiology)  Date of Service:  06/20/2022  CHIEF COMPLAINT: f/u of recurrent lung cancer, h/o breast cancer   CURRENT THERAPY: Surveillance    ASSESSMENT:  Melissa Snyder is a 89 y.o. female with   Primary cancer of left lower lobe of lung ([place]) Stage I left lung adenocarcinoma, pT1aN0M0 in 2016, LLL recurrence in 06/2017, LUL recurrence in 10/2020, malignant left pleural effusion in 04/2022 -initially diagnosed in 11/2014, s/p LLL segmentectomy by Dr. Roxan Hockey, pathology showing stage I adenocarcinoma -She had cancer recurrence in LLL in 06/2017 and in LUL in 10/2020. She was treated with radiation in 07/2017 and in 11/2020 with Dr. Lisbeth Renshaw.  -surveillance chest CT on 04/06/22 showed stable lung change, no evidence of recurrence. Also noted was a small left pleural effusion, increased compared to prior.  -She underwent left thoracentesis on 05/02/22 and unfortunately cytology was positive for adenocarcinoma, TTF-1 positive, consistent with recurrent lung adenocarcinoma. 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  I discussed the potential benefit and side effect, especially diarrhea, skin rashes,  cytopenias, fatigue, etc.  she agrees to proceed. -I will order PET scan and brain MRI to be done in next few week for staging  -we obtain EKG today which showed slightly prolonged QTc, will monitor closely when she is on Osimertinib     PLAN: -[place] [facility] results - Discuss Oral TKI Osimertinib and its side effects, I recommend her to start at 40mg  daily and monitor her EKG closely, if she tolerates well, plan to increase to 80 mg daily -order PET scan and Brain MRI to be done in the next 2 weeks -f/u in about 2 weeks with repeat EKG -I tried to call her daughter Gregary Signs twice today and was only able to leave her message.    SUMMARY OF ONCOLOGIC HISTORY: Oncology History Overview Note   Cancer Staging  Breast cancer of upper-outer quadrant of right female breast [facility]) Staging form: Breast, AJCC 7th Edition - Clinical stage from 05/19/2014: Stage IA (T1b, N0, M0) - Signed by Holley Bouche, NP on 11/17/2015 - Pathologic stage from 10/17/2014: Stage IA (T1b, N0, cM0) - Signed by [name], MD on 10/17/2014  Primary cancer of left lower lobe of lung [facility]) Staging form: Lung, AJCC 7th Edition - Clinical stage from 12/14/2014: Stage IA (T1a, N0, M0) - Unsigned    Breast cancer of upper-outer quadrant of right female breast ([place])  05/04/2014 Initial Diagnosis   Breast cancer   05/19/2014 Imaging   Diagnostic mammogram and ultrasound showed a 7 mm irregular suspicious mass located in the right breast at the 10:00 position, 7 cm from the nipple. Otherwise negative.   05/19/2014 Initial Biopsy   Right breast biopsy (UOQ): IDC, grade 2.    05/19/2014 Receptors her2   ER 100% positive, PR 35% positive,  HER-2 negative, Ki67 8%   06/07/2014 Miscellaneous   Genetic testing normal.  APC, ATM, BARD1, BMPR1A, BRCA1, BRCA2, BRIP1, CDH1, CDK4, CDKN2A, CHEK2, EPCAM, GREM1, MLH1, MRE11A, MSH2, MSH6, MUTYH, NBN, NF1, PALB2, PMS2, POLD1, POLE, PTEN, RAD50, RAD51D, SMAD4, SMARCA4,  STK11, and TP53 tested.    07/23/2014 Surgery   Right breast lumpectomy with SLNB Ezzard Standing) showed invasive ductal carcinoma & DCIS; negative surgical margins.   07/23/2014 Pathology Results   Invasive ductal carcinoma, tumor size 0.8 cm, grade 1, no lymphovascular invasion, margins were negative, 2 sentinel lymph nodes were negative. (+) DCIS.   07/23/2014 Pathologic Stage   pT1b, pN0: Stage IA    09/16/2014 - 10/07/2014 Radiation Therapy   XRT completed Michell Heinrich). Right breast/ 42.72 Gy at 2.67 Gy per fraction x 21 fractions.     Anti-estrogen oral therapy   Aromasin prescribed in 01/2015 but Patient declines anti-estrogen therapy at this time d/t high co-pay of medication and possible side effects of AI therapy.    05/02/2015 Mammogram   Diagnostic TOMO bilat mammogram: No evidence of malignancy in either breast. Lumpectomy changes in right breast. Diagnostic mammo suggested in 1 year.    Primary cancer of left lower lobe of lung (HCC)  10/25/2013 Imaging   CXR done for stroke protocol. No acute chest findings. Chronic lung disease with biapical scarring. Developing nodule in (L) apex cannot be excluded based on exam, athough may be d/t overlap of osseous structures.    10/27/2013 Imaging   CXR: Nodular opacity in mid (L) apex and appears more prominent than prior study. Area that appeared masslike in LUL near apex on recent CXR not seen at this time.  Underlying emphysema present. No edema or consolidation   11/12/2013 Imaging   CT chest: At left lung apex, there is asymmetric pleural parenchymal scarring. No discrete lesion.  In superior segment of LLL, there are 1 dominant ground-glass opacity measuring 16 mm. 2 addt'l ground-glass opacities appreciated. F/U with CT in 3 months   02/11/2014 Imaging   CT chest: Several small bilat faint nodular densities, largest measures 1.3 cm over superior segment LLL. No new nodules, no adenopathy. Cannot exclude metastatic disease given pt's h/o  melanoma. Recommend PET-CT   02/23/2014 PET scan   Persistent semi-solid nodule in superior segment LLL with low metabolic activity. Moderately metabolic subcarinal & hilar LN are likely reactive.    05/25/2014 Imaging   CT chest: Persistent & similar size dominant LLL sub-solid nodule. Adenocarcinoma is considered. Biopsy or resection strongly considered. Additional scattered small ground-glass nodules stable.    06/02/2014 Imaging   MRI brain: Grossly stable appearance of left planum sphenoidale meningioma measuring 20 x 14 mm. Meningioma does not extend to left orbital apex & contacts left optic nerve. Stable right posterior clinoid meningioma .    09/27/2014 Imaging   CT chest: Further enlargment of central solid component in sub-solid LLL pulm nodule. Findings remain concerning for adenoca. Additional scattered small bilat ground-glass nodules and biapical scarring stable. No adenopathy.     11/25/2014 Surgery   Video bronchoscopy with endobronchial ultrasound; Video assisted thoracoscopy; Thoracoscopic LLL superior segmentectomy; Mediastinal lymph node dissection; Cyro intercostal nerve block Dorris Fetch)   11/25/2014 Pathology Results   Superior segment lung resection (LLL). Adenocarcinoma, well-diff, spanning 1.5 cm. Surgical margins negative. 5 LNs negative.    11/25/2014 Pathologic Stage   pT1a, pN0: Stage IA    11/25/2014 Initial Diagnosis   Primary cancer of left lower lobe of lung (HCC)   05/31/2015 Imaging  CT chest: No evidence of local recurrent at superior segmentectomy in LLL. New small nodular focus of consolidation surrounding thin parenchymal band. Additional sub-cm ground-glass and solid nodules favored to be benign. No thoracic adenopathy   08/11/2015 Imaging   CT chest: No evidence of residual or recurrent tumor in LLL. Stable small nodular focus in LLL with thin surrounding parenchymal band, favored to be benign.  Multiple small ground-glass nodules unchanged.     06/19/2017 Pathology Results   Diagnosis 1. Lung, biopsy, Left lower lobe - ADENOCARCINOMA. - SEE COMMENT. 2. Lung, biopsy, Left lower lobe - BENIGN LUNG PARENCHYMA. - THERE IS NO EVIDENCE OF MALIGNANCY.   07/09/2017 - 07/22/2017 Radiation Therapy   Radiation treatment dates: 07/09/17-07/22/17 by Dr. Mitzi Hansen    Site/dose:  Left lung/ 50 Gy in 10 fractions   Beams/energy:  IMRT/ 6X   Narrative: The patient tolerated radiation treatment relatively well. She denied pain, fatigue, or difficulty swallowing. Skin to the treatment area appears warm, dry, and normal in color.   06/18/2019 Imaging   CT Chest  IMPRESSION: 1. There are 2 round solid nodules within the left lower lobe which have increased in size when compared with the previous exam, suspicious. Additionally, along the area of postsurgical change around the oblique fissure of the left lung there is progressive thickening with persistent loculated fluid. Cannot rule out local tumor recurrence along the suture line. 2. No significant change in the appearance of bilateral upper lobe sub solid lung nodules. 3. Aortic Atherosclerosis (ICD10-I70.0). Coronary artery calcifications.     07/07/2019 PET scan   IMPRESSION: 1. No evidence of hypermetabolic local tumor recurrence in the left lung. Low level nonfocal uptake along the segmentectomy site in the left lower lobe favors post treatment change. Continued chest CT surveillance warranted. 2. Two small solid left lower lobe pulmonary nodules, largest 0.7 cm, below PET resolution. Given the growth of these nodules since 10/03/2018 chest CT, metastatic disease remains on the differential. Close chest CT surveillance recommended. 3. Nonspecific hypermetabolism within nonenlarged subcarinal and bilateral hilar lymph nodes, not substantially changed since 03/04/2017 PET-CT study, favoring reactive uptake. 4. No hypermetabolic distant metastatic disease. 5. Chronic findings include:  Aortic Atherosclerosis (ICD10-I70.0). Subcentimeter ground-glass pulmonary nodules in the upper lobes are unchanged and are below PET resolution. Moderate sigmoid diverticulosis. Chronic right maxillary sinusitis.   04/13/2020 Imaging   CT Chest  IMPRESSION: 1. Primarily similar appearance of the lungs compared to 10/14/2019. Left sided surgical sutures with similar solid and subsolid pulmonary nodules as detailed above. A right middle lobe pleural based 5 mm nodule is new since the prior exam, warranting follow-up attention. 2. No thoracic adenopathy. 3. Aortic atherosclerosis (ICD10-I70.0), coronary artery atherosclerosis and emphysema (ICD10-J43.9).   10/10/2020 Imaging   CT chest  IMPRESSION: Postsurgical and post radiation changes in the lungs bilaterally, as above.   12 x 10 mm left lower lobe nodule, mildly progressive. Additional bilobed nodule in the posterior left lower lobe, also favored to be progressive. PET-CT is suggested for further evaluation.   Aortic Atherosclerosis (ICD10-I70.0) and Emphysema (ICD10-J43.9).   11/22/2020 - 12/02/2020 Radiation Therapy   Site Technique Total Dose (Gy) Dose per Fx (Gy) Completed Fx Beam Energies  Lung, Left: Lung_Lt_LLL IMRT 60/60 12 5/5 6XFFF     05/22/2021 Imaging   EXAM: CT CHEST WITHOUT CONTRAST  IMPRESSION: 1. Interval development of a new area of volume loss, ground-glass opacity, and bronchiectasis in the posterior left lower lobe. Imaging features are compatible with radiation therapy.  2. The posterior left lung nodules, including index 12 x 10 mm nodule described previously have become incorporated into the post treatment change on the current study. 3. Interval resolution of the posterior right middle lobe nodularity seen previously. 4. Other scattered areas of consolidative opacity or similar. 5. Trace left pleural effusion. 6. Aortic Atherosclerosis (ICD10-I70.0).      INTERVAL HISTORY:  Melissa Snyder is  here for a follow up of recurrent lung cancer, h/o breast cancer  She was last seen by me on 06/13/2022 She presents to the clinic accompanied by husband. Pt reports that her breathing is fine .       All other systems were reviewed with the patient and are negative.  MEDICAL HISTORY:  Past Medical History:  Diagnosis Date   Adenocarcinoma (HCC)    recurrent/notes 07/03/2017   Blind left eye 10/2013   cva   Breast cancer (HCC) 05/2014   ER+/PR+/Her2-     LUNG CANCER   Bundle branch block left    Chest pain    Family history of bladder cancer    Family history of breast cancer    Family history of prostate cancer    GERD (gastroesophageal reflux disease)    History of blood transfusion    age 64   Hyperlipidemia    Hypertension    Hypothyroid    Lung nodule    Melanoma in situ of face (HCC) JUNE 2015   LEFT CHEEK   Meningioma (HCC)    brain lining   Migraine    reports only having one   Osteoarthritis    Personal history of radiation therapy    Radiation 09/16/14-10/07/14   Right  Breast  21 fractions   Skin cancer    Stroke (HCC) 2015   Blind in left eye as a result    SURGICAL HISTORY: Past Surgical History:  Procedure Laterality Date   ABDOMINAL HYSTERECTOMY  ~ 1997   APPENDECTOMY     age 31   BILATERAL SALPINGOOPHORECTOMY     BLADDER REPAIR  2009   cystocele   BRAIN MENINGIOMA EXCISION     Gamma knife to Meningioma in brain lining   BREAST BIOPSY     BREAST LUMPECTOMY Right 07/23/2014   invasive ductal   BUNIONECTOMY     bilateral great toe   CARDIAC CATHETERIZATION  2004   CHOLECYSTECTOMY  ~ 1997   COLONOSCOPY     CRYO INTERCOSTAL NERVE BLOCK Left 11/25/2014   Procedure: CRYO INTERCOSTAL NERVE BLOCK;  Surgeon: Loreli Slot, MD;  Location: [facility] OR;  Service: Thoracic;  Laterality: Left;   CT RADIATION THERAPY GUIDE     Gamma radiation -lt frontal-[facility]   DILATION AND CURETTAGE OF UTERUS     X 2   HIP ARTHROPLASTY Left 07/04/2017   Procedure:  ARTHROPLASTY BIPOLAR HIP (HEMIARTHROPLASTY);  Surgeon: Jones Broom, MD;  Location: [facility] OR;  Service: Orthopedics;  Laterality: Left;   MELANOMA EXCISION Left 11/05/23, 11/30/13   CHEEK   SEGMENTECOMY Left 11/25/2014   Procedure: LEFT LOWER LOBE SEGMENTECTOMY;  Surgeon: Loreli Slot, MD;  Location[facility] OR;  Service: Thoracic;  Laterality: Left;   VIDEO ASSISTED THORACOSCOPY Left 11/25/2014   Procedure: VIDEO ASSISTED THORACOSCOPY;  Surgeon: Loreli Slot, MD;  Location: [facility] OR;  Service: Thoracic;  Laterality: Left;   VIDEO BRONCHOSCOPY WITH ENDOBRONCHIAL NAVIGATION N/A 06/19/2017   Procedure: VIDEO BRONCHOSCOPY;  Surgeon: Loreli Slot, MD;  Location: [facility] OR;  Service: Thoracic;  Laterality: N/A;  VIDEO BRONCHOSCOPY WITH ENDOBRONCHIAL ULTRASOUND N/A 11/25/2014   Procedure: VIDEO BRONCHOSCOPY WITH ENDOBRONCHIAL ULTRASOUND;  Surgeon: Loreli Slot, MD;  Location: [facility] OR;  Service: Thoracic;  Laterality: N/A;    I have reviewed the social history and family history with the patient and they are unchanged from previous note.  ALLERGIES:  is allergic to beef-derived products, other, chocolate, ibuprofen, niacin and related, and tramadol.  MEDICATIONS:  Current Outpatient Medications  Medication Sig Dispense Refill   aspirin 81 MG EC tablet      atenolol (TENORMIN) 50 MG tablet Take 50 mg by mouth at bedtime.      b complex vitamins capsule Take 1 capsule by mouth daily.     BINAXNOW COVID-19 AG HOME TEST KIT See admin instructions.     calcium carbonate (OSCAL) 1500 (600 Ca) MG TABS tablet      calcium-vitamin D (OSCAL WITH D) 500-200 MG-UNIT per tablet Take 1 tablet by mouth at bedtime.      Cholecalciferol (VITAMIN D3) 1000 units CAPS Take by mouth daily.     denosumab (PROLIA) 60 MG/ML SOSY injection      Digestive Enzymes (ENZYME DIGEST PO) Take 5 mLs by mouth 3 (three) times daily with meals.     FLUZONE HIGH-DOSE QUADRIVALENT 0.7 ML SUSY      levothyroxine  (SYNTHROID, LEVOTHROID) 112 MCG tablet Take 112 mcg by mouth daily before breakfast.      lisinopril (PRINIVIL,ZESTRIL) 10 MG tablet Take 1 tablet (10 mg total) by mouth daily. (Patient taking differently: Take 10 mg by mouth 2 (two) times daily.) 30 tablet 1   meclizine (ANTIVERT) 12.5 MG tablet Take 12.5 mg by mouth 3 (three) times daily as needed for dizziness.     Multiple Vitamin (MULTIVITAMIN) tablet Take 1 tablet by mouth every morning.      Omega-3 1000 MG CAPS Take 1 g by mouth daily.     osimertinib mesylate (TAGRISSO) 40 MG tablet Take 1 tablet (40 mg total) by mouth daily. 30 tablet 0   Polyvinyl Alcohol-Povidone (REFRESH OP) Apply 1 drop to eye as needed (DRY EYES).      Triamcinolone Acetonide (NASACORT AQ NA) Place 1 spray into the nose at bedtime as needed (CONGESTION).     vitamin E 180 MG (400 UNITS) capsule Take 400 Units by mouth every Monday, Wednesday, and Friday. Mon Wed Fri     No current facility-administered medications for this visit.    PHYSICAL EXAMINATION: ECOG PERFORMANCE STATUS: 1 - Symptomatic but completely ambulatory  Vitals:   06/20/22 1142  BP: (!) 174/73  Pulse: 77  Resp: 19  Temp: 98 F (36.7 C)  SpO2: 98%   Wt Readings from Last 3 Encounters:  06/20/22 117 lb 11.2 oz (53.4 kg)  06/13/22 114 lb 14.4 oz (52.1 kg)  04/11/22 111 lb 12.8 oz (50.7 kg)      GENERAL:alert, no distress and comfortable SKIN: skin color normal, no rashes or significant lesions EYES: normal, Conjunctiva are pink and non-injected, sclera clear  NEURO: alert & oriented x 3 with fluent speech  LABORATORY DATA:  I have reviewed the data as listed    Latest Ref Rng & Units 06/13/2022    1:38 PM 04/04/2022   10:00 AM 09/18/2021   10:39 AM  CBC  WBC 4.0 - 10.5 K/uL 4.6  3.9  4.6   Hemoglobin 12.0 - 15.0 g/dL 76.2  26.3  33.5   Hematocrit 36.0 - 46.0 % 38.6  39.4  36.3  Platelets 150 - 400 K/uL 160  151  155         Latest Ref Rng & Units 06/13/2022    1:38 PM  04/04/2022   10:00 AM 09/18/2021   10:39 AM  CMP  Glucose 70 - 99 mg/dL 94  137  109   BUN 8 - 23 mg/dL 18  18  19    Creatinine 0.44 - 1.00 mg/dL 0.61  0.74  0.72   Sodium 135 - 145 mmol/L 139  140  142   Potassium 3.5 - 5.1 mmol/L 4.0  4.2  3.9   Chloride 98 - 111 mmol/L 104  103  105   CO2 22 - 32 mmol/L 32  32  32   Calcium 8.9 - 10.3 mg/dL 9.3  9.3  9.1   Total Protein 6.5 - 8.1 g/dL 7.0  7.3  6.9   Total Bilirubin 0.3 - 1.2 mg/dL 0.6  0.6  0.5   Alkaline Phos 38 - 126 U/L 95  96  112   AST 15 - 41 U/L 21  23  18    ALT 0 - 44 U/L 18  20  14        RADIOGRAPHIC STUDIES: I have personally reviewed the radiological images as listed and agreed with the findings in the report. No results found.    Orders Placed This Encounter  Procedures   NM PET Image Restag (PS) Skull Base To Thigh    Standing Status:   Future    Standing Expiration Date:   06/20/2023    Order Specific Question:   If indicated for the ordered procedure, I authorize the administration of a radiopharmaceutical per Radiology protocol    Answer:   Yes    Order Specific Question:   Preferred imaging location?    Answer:   [facility]   MR Brain W Wo Contrast    Standing Status:   Future    Standing Expiration Date:   06/20/2023    Order Specific Question:   If indicated for the ordered procedure, I authorize the administration of contrast media per Radiology protocol    Answer:   Yes    Order Specific Question:   What is the patient's sedation requirement?    Answer:   No Sedation    Order Specific Question:   Does the patient have a pacemaker or implanted devices?    Answer:   No    Order Specific Question:   Use SRS Protocol?    Answer:   No    Order Specific Question:   Preferred imaging location?    Answer:   [facility] (table limit - 550 lbs)   EKG 12-Lead    Ordered by an unspecified provider    EKG 12-Lead    Ordered by an unspecified provider    All questions were answered. The patient  knows to call the clinic with any problems, questions or concerns. No barriers to learning was detected. The total time spent in the appointment was 30 minutes.     [name], MD 06/20/2022   Felicity Coyer, CMA, am acting as scribe for [name], MD.   I have reviewed the above documentation for accuracy and completeness, and I agree with the above.

## 2022-06-19 NOTE — Assessment & Plan Note (Addendum)
Stage I left lung adenocarcinoma, pT1aN0M0 in 2016, LLL recurrence in 06/2017, LUL recurrence in 10/2020, malignant left pleural effusion in 04/2022 -initially diagnosed in 11/2014, s/p LLL segmentectomy by Dr. Dorris Fetch, pathology showing stage I adenocarcinoma -She had cancer recurrence in LLL in 06/2017 and in LUL in 10/2020. She was treated with radiation in 07/2017 and in 11/2020 with Dr. Mitzi Hansen.  -surveillance chest CT on 04/06/22 showed stable lung change, no evidence of recurrence. Also noted was a small left pleural effusion, increased compared to prior.  -She underwent left thoracentesis on 05/02/22 and unfortunately cytology was positive for adenocarcinoma, TTF-1 positive, consistent with recurrent lung adenocarcinoma. -I discussed this is unfortunately not curable disease, I will obtain a PET scan to see if there are any other distant metastasis, and brain MRI -[facility] One showed EGFR L858R mutation(+), which indicating good response to 2 EGFR inhibitor, such as osimertinib, which I recommend.  I discussed the potential benefit and side effect, especially diarrhea, skin rashes, cytopenias, fatigue, etc.  she agrees to proceed. -I will order PET scan and brain MRI to be done in next few week for staging

## 2022-06-20 ENCOUNTER — Other Ambulatory Visit: Payer: Self-pay

## 2022-06-20 ENCOUNTER — Inpatient Hospital Stay: Payer: Medicare HMO | Admitting: Pharmacist

## 2022-06-20 ENCOUNTER — Telehealth: Payer: Self-pay | Admitting: Pharmacy Technician

## 2022-06-20 ENCOUNTER — Other Ambulatory Visit (HOSPITAL_COMMUNITY): Payer: Self-pay

## 2022-06-20 ENCOUNTER — Inpatient Hospital Stay: Payer: Medicare HMO | Admitting: Hematology

## 2022-06-20 ENCOUNTER — Encounter: Payer: Self-pay | Admitting: Hematology

## 2022-06-20 VITALS — BP 174/73 | HR 77 | Temp 98.0°F | Resp 19 | Ht 65.0 in | Wt 117.7 lb

## 2022-06-20 DIAGNOSIS — C3432 Malignant neoplasm of lower lobe, left bronchus or lung: Secondary | ICD-10-CM | POA: Diagnosis not present

## 2022-06-20 DIAGNOSIS — Z85828 Personal history of other malignant neoplasm of skin: Secondary | ICD-10-CM | POA: Diagnosis not present

## 2022-06-20 DIAGNOSIS — C3490 Malignant neoplasm of unspecified part of unspecified bronchus or lung: Secondary | ICD-10-CM

## 2022-06-20 DIAGNOSIS — C3412 Malignant neoplasm of upper lobe, left bronchus or lung: Secondary | ICD-10-CM | POA: Diagnosis not present

## 2022-06-20 DIAGNOSIS — J910 Malignant pleural effusion: Secondary | ICD-10-CM | POA: Diagnosis not present

## 2022-06-20 DIAGNOSIS — Z853 Personal history of malignant neoplasm of breast: Secondary | ICD-10-CM | POA: Diagnosis not present

## 2022-06-20 DIAGNOSIS — C50411 Malignant neoplasm of upper-outer quadrant of right female breast: Secondary | ICD-10-CM

## 2022-06-20 MED ORDER — OSIMERTINIB MESYLATE 40 MG PO TABS
40.0000 mg | ORAL_TABLET | Freq: Every day | ORAL | 0 refills | Status: DC
  Filled 2022-06-20 (×2): qty 30, 30d supply, fill #0

## 2022-06-20 MED ORDER — OSIMERTINIB MESYLATE 40 MG PO TABS
80.0000 mg | ORAL_TABLET | Freq: Every day | ORAL | 0 refills | Status: DC
  Filled 2022-06-20: qty 60, 30d supply, fill #0

## 2022-06-20 NOTE — Telephone Encounter (Signed)
Oral Oncology Patient Advocate Encounter   Was successful in securing patient a $4,000 grant from [employer] to provide copayment coverage for Tagrisso.  This will keep the out of pocket expense at $0.     I have spoken with the patient.    The billing information is as follows and has been shared with [facility].   Member ID: [id number] Group ID: [id number] RxBin: [id number] PCN: [id number] Dates of Eligibility: 06/20/22 through 06/21/23  Fund name:  [employer].   Jinger Neighbors, CPhT-Adv Oncology Pharmacy Patient Advocate [facility] Direct Number: [phone]  Fax: [phone]

## 2022-06-20 NOTE — Progress Notes (Signed)
Oral Chemotherapy Clinic [facility]  Telephone:([phone]) [phone] Fax:(336) [phone]  Patient Care Team: Rodrigo Ran, MD as PCP - General (Internal Medicine) Loreli Slot, MD as Consulting Physician (Cardiothoracic Surgery) Alfredo Martinez, MD as Consulting Physician (Urology) Malachy Mood, MD as Consulting Physician (Hematology) Lurline Hare, MD as Consulting Physician (Radiation Oncology) Ronny Bacon, PA-C as Physician Assistant (Radiation Oncology) Imaging, [facility] as Consulting Physician (Diagnostic Radiology)   Name of the patient: Melissa Snyder  [id number]  1934-05-16   Date of visit: 06/20/22  HPI: Patient is a 89 y.o. female with now metastatic non-small cell lung cancer, EGFR L858R mutated.   Reason for Consult: Oral chemotherapy initial education for Tagrisso (osimertinib).   PAST MEDICAL HISTORY: Past Medical History:  Diagnosis Date   Adenocarcinoma (HCC)    recurrent/notes 07/03/2017   Blind left eye 10/2013   cva   Breast cancer (HCC) 05/2014   ER+/PR+/Her2-     LUNG CANCER   Bundle branch block left    Chest pain    Family history of bladder cancer    Family history of breast cancer    Family history of prostate cancer    GERD (gastroesophageal reflux disease)    History of blood transfusion    age 53   Hyperlipidemia    Hypertension    Hypothyroid    Lung nodule    Melanoma in situ of face (HCC) JUNE 2015   LEFT CHEEK   Meningioma (HCC)    brain lining   Migraine    reports only having one   Osteoarthritis    Personal history of radiation therapy    Radiation 09/16/14-10/07/14   Right  Breast  21 fractions   Skin cancer    Stroke (HCC) 2015   Blind in left eye as a result    HEMATOLOGY/ONCOLOGY HISTORY:  Oncology History Overview Note   Cancer Staging  Breast cancer of upper-outer quadrant of right female breast (HCC) Staging form: Breast, AJCC 7th Edition - Clinical stage from  05/19/2014: Stage IA (T1b, N0, M0) - Signed by Hubbard Hartshorn, NP on 11/17/2015 - Pathologic stage from 10/17/2014: Stage IA (T1b, N0, cM0) - Signed by Lurline Hare, MD on 10/17/2014  Primary cancer of left lower lobe of lung (HCC) Staging form: Lung, AJCC 7th Edition - Clinical stage from 12/14/2014: Stage IA (T1a, N0, M0) - Unsigned    Breast cancer of upper-outer quadrant of right female breast (HCC)  05/04/2014 Initial Diagnosis   Breast cancer   05/19/2014 Imaging   Diagnostic mammogram and ultrasound showed a 7 mm irregular suspicious mass located in the right breast at the 10:00 position, 7 cm from the nipple. Otherwise negative.   05/19/2014 Initial Biopsy   Right breast biopsy (UOQ): IDC, grade 2.    05/19/2014 Receptors her2   ER 100% positive, PR 35% positive, HER-2 negative, Ki67 8%   06/07/2014 Miscellaneous   Genetic testing normal.  APC, ATM, BARD1, BMPR1A, BRCA1, BRCA2, BRIP1, CDH1, CDK4, CDKN2A, CHEK2, EPCAM, GREM1, MLH1, MRE11A, MSH2, MSH6, MUTYH, NBN, NF1, PALB2, PMS2, POLD1, POLE, PTEN, RAD50, RAD51D, SMAD4, SMARCA4, STK11, and TP53 tested.    07/23/2014 Surgery   Right breast lumpectomy with SLNB Ezzard Standing) showed invasive ductal carcinoma & DCIS; negative surgical margins.   07/23/2014 Pathology Results   Invasive ductal carcinoma, tumor size 0.8 cm, grade 1, no lymphovascular invasion, margins were negative, 2 sentinel lymph nodes were negative. (+) DCIS.   07/23/2014 Pathologic Stage  pT1b, pN0: Stage IA    09/16/2014 - 10/07/2014 Radiation Therapy   XRT completed Michell Heinrich). Right breast/ 42.72 Gy at 2.67 Gy per fraction x 21 fractions.     Anti-estrogen oral therapy   Aromasin prescribed in 01/2015 but Patient declines anti-estrogen therapy at this time d/t high co-pay of medication and possible side effects of AI therapy.    05/02/2015 Mammogram   Diagnostic TOMO bilat mammogram: No evidence of malignancy in either breast. Lumpectomy changes in right  breast. Diagnostic mammo suggested in 1 year.    Primary cancer of left lower lobe of lung (HCC)  10/25/2013 Imaging   CXR done for stroke protocol. No acute chest findings. Chronic lung disease with biapical scarring. Developing nodule in (L) apex cannot be excluded based on exam, athough may be d/t overlap of osseous structures.    10/27/2013 Imaging   CXR: Nodular opacity in mid (L) apex and appears more prominent than prior study. Area that appeared masslike in LUL near apex on recent CXR not seen at this time.  Underlying emphysema present. No edema or consolidation   11/12/2013 Imaging   CT chest: At left lung apex, there is asymmetric pleural parenchymal scarring. No discrete lesion.  In superior segment of LLL, there are 1 dominant ground-glass opacity measuring 16 mm. 2 addt'l ground-glass opacities appreciated. F/U with CT in 3 months   02/11/2014 Imaging   CT chest: Several small bilat faint nodular densities, largest measures 1.3 cm over superior segment LLL. No new nodules, no adenopathy. Cannot exclude metastatic disease given pt's h/o melanoma. Recommend PET-CT   02/23/2014 PET scan   Persistent semi-solid nodule in superior segment LLL with low metabolic activity. Moderately metabolic subcarinal & hilar LN are likely reactive.    05/25/2014 Imaging   CT chest: Persistent & similar size dominant LLL sub-solid nodule. Adenocarcinoma is considered. Biopsy or resection strongly considered. Additional scattered small ground-glass nodules stable.    06/02/2014 Imaging   MRI brain: Grossly stable appearance of left planum sphenoidale meningioma measuring 20 x 14 mm. Meningioma does not extend to left orbital apex & contacts left optic nerve. Stable right posterior clinoid meningioma .    09/27/2014 Imaging   CT chest: Further enlargment of central solid component in sub-solid LLL pulm nodule. Findings remain concerning for adenoca. Additional scattered small bilat ground-glass nodules and  biapical scarring stable. No adenopathy.     11/25/2014 Surgery   Video bronchoscopy with endobronchial ultrasound; Video assisted thoracoscopy; Thoracoscopic LLL superior segmentectomy; Mediastinal lymph node dissection; Cyro intercostal nerve block Dorris Fetch)   11/25/2014 Pathology Results   Superior segment lung resection (LLL). Adenocarcinoma, well-diff, spanning 1.5 cm. Surgical margins negative. 5 LNs negative.    11/25/2014 Pathologic Stage   pT1a, pN0: Stage IA    11/25/2014 Initial Diagnosis   Primary cancer of left lower lobe of lung (HCC)   05/31/2015 Imaging   CT chest: No evidence of local recurrent at superior segmentectomy in LLL. New small nodular focus of consolidation surrounding thin parenchymal band. Additional sub-cm ground-glass and solid nodules favored to be benign. No thoracic adenopathy   08/11/2015 Imaging   CT chest: No evidence of residual or recurrent tumor in LLL. Stable small nodular focus in LLL with thin surrounding parenchymal band, favored to be benign.  Multiple small ground-glass nodules unchanged.    06/19/2017 Pathology Results   Diagnosis 1. Lung, biopsy, Left lower lobe - ADENOCARCINOMA. - SEE COMMENT. 2. Lung, biopsy, Left lower lobe - BENIGN LUNG PARENCHYMA. -  THERE IS NO EVIDENCE OF MALIGNANCY.   07/09/2017 - 07/22/2017 Radiation Therapy   Radiation treatment dates: 07/09/17-07/22/17 by Dr. Mitzi Hansen    Site/dose:  Left lung/ 50 Gy in 10 fractions   Beams/energy:  IMRT/ 6X   Narrative: The patient tolerated radiation treatment relatively well. She denied pain, fatigue, or difficulty swallowing. Skin to the treatment area appears warm, dry, and normal in color.   06/18/2019 Imaging   CT Chest  IMPRESSION: 1. There are 2 round solid nodules within the left lower lobe which have increased in size when compared with the previous exam, suspicious. Additionally, along the area of postsurgical change around the oblique fissure of the left lung  there is progressive thickening with persistent loculated fluid. Cannot rule out local tumor recurrence along the suture line. 2. No significant change in the appearance of bilateral upper lobe sub solid lung nodules. 3. Aortic Atherosclerosis (ICD10-I70.0). Coronary artery calcifications.     07/07/2019 PET scan   IMPRESSION: 1. No evidence of hypermetabolic local tumor recurrence in the left lung. Low level nonfocal uptake along the segmentectomy site in the left lower lobe favors post treatment change. Continued chest CT surveillance warranted. 2. Two small solid left lower lobe pulmonary nodules, largest 0.7 cm, below PET resolution. Given the growth of these nodules since 10/03/2018 chest CT, metastatic disease remains on the differential. Close chest CT surveillance recommended. 3. Nonspecific hypermetabolism within nonenlarged subcarinal and bilateral hilar lymph nodes, not substantially changed since 03/04/2017 PET-CT study, favoring reactive uptake. 4. No hypermetabolic distant metastatic disease. 5. Chronic findings include: Aortic Atherosclerosis (ICD10-I70.0). Subcentimeter ground-glass pulmonary nodules in the upper lobes are unchanged and are below PET resolution. Moderate sigmoid diverticulosis. Chronic right maxillary sinusitis.   04/13/2020 Imaging   CT Chest  IMPRESSION: 1. Primarily similar appearance of the lungs compared to 10/14/2019. Left sided surgical sutures with similar solid and subsolid pulmonary nodules as detailed above. A right middle lobe pleural based 5 mm nodule is new since the prior exam, warranting follow-up attention. 2. No thoracic adenopathy. 3. Aortic atherosclerosis (ICD10-I70.0), coronary artery atherosclerosis and emphysema (ICD10-J43.9).   10/10/2020 Imaging   CT chest  IMPRESSION: Postsurgical and post radiation changes in the lungs bilaterally, as above.   12 x 10 mm left lower lobe nodule, mildly progressive.  Additional bilobed nodule in the posterior left lower lobe, also favored to be progressive. PET-CT is suggested for further evaluation.   Aortic Atherosclerosis (ICD10-I70.0) and Emphysema (ICD10-J43.9).   11/22/2020 - 12/02/2020 Radiation Therapy   Site Technique Total Dose (Gy) Dose per Fx (Gy) Completed Fx Beam Energies  Lung, Left: Lung_Lt_LLL IMRT 60/60 12 5/5 6XFFF     05/22/2021 Imaging   EXAM: CT CHEST WITHOUT CONTRAST  IMPRESSION: 1. Interval development of a new area of volume loss, ground-glass opacity, and bronchiectasis in the posterior left lower lobe. Imaging features are compatible with radiation therapy. 2. The posterior left lung nodules, including index 12 x 10 mm nodule described previously have become incorporated into the post treatment change on the current study. 3. Interval resolution of the posterior right middle lobe nodularity seen previously. 4. Other scattered areas of consolidative opacity or similar. 5. Trace left pleural effusion. 6. Aortic Atherosclerosis (ICD10-I70.0).     ALLERGIES:  is allergic to beef-derived products, other, chocolate, ibuprofen, niacin and related, and tramadol.  MEDICATIONS:  Current Outpatient Medications  Medication Sig Dispense Refill   aspirin 81 MG EC tablet      atenolol (TENORMIN) 50 MG tablet  Take 50 mg by mouth at bedtime.      b complex vitamins capsule Take 1 capsule by mouth daily.     BINAXNOW COVID-19 AG HOME TEST KIT See admin instructions.     calcium carbonate (OSCAL) 1500 (600 Ca) MG TABS tablet      calcium-vitamin D (OSCAL WITH D) 500-200 MG-UNIT per tablet Take 1 tablet by mouth at bedtime.      cephALEXin (KEFLEX) 500 MG capsule Take 1 capsule (500 mg total) by mouth 2 (two) times daily. 10 capsule 0   Cholecalciferol (VITAMIN D3) 1000 units CAPS Take by mouth daily.     denosumab (PROLIA) 60 MG/ML SOSY injection      Digestive Enzymes (ENZYME DIGEST PO) Take 5 mLs by mouth 3 (three) times daily  with meals.     fish oil-omega-3 fatty acids 1000 MG capsule Take 1 g by mouth daily.      FLUZONE HIGH-DOSE QUADRIVALENT 0.7 ML SUSY      levothyroxine (SYNTHROID, LEVOTHROID) 112 MCG tablet Take 112 mcg by mouth daily before breakfast.      lisinopril (PRINIVIL,ZESTRIL) 10 MG tablet Take 1 tablet (10 mg total) by mouth daily. (Patient taking differently: Take 10 mg by mouth 2 (two) times daily.) 30 tablet 1   meclizine (ANTIVERT) 12.5 MG tablet Take 12.5 mg by mouth 3 (three) times daily as needed for dizziness.     metroNIDAZOLE (FLAGYL) 500 MG tablet Take 1 tablet (500 mg total) by mouth 2 (two) times daily. One po bid x 7 days 14 tablet 0   Multiple Vitamin (MULTIVITAMIN) tablet Take 1 tablet by mouth every morning.      Omega-3 1000 MG CAPS Take 1 g by mouth daily.     osimertinib mesylate (TAGRISSO) 40 MG tablet Take 2 tablets (80 mg total) by mouth daily. 60 tablet 0   Polyvinyl Alcohol-Povidone (REFRESH OP) Apply 1 drop to eye as needed (DRY EYES).      Triamcinolone Acetonide (NASACORT AQ NA) Place 1 spray into the nose at bedtime as needed (CONGESTION).     vitamin E 180 MG (400 UNITS) capsule Take 400 Units by mouth every Monday, Wednesday, and Friday. Mon Wed Fri     No current facility-administered medications for this visit.    VITAL SIGNS: There were no vitals taken for this visit. There were no vitals filed for this visit.  Estimated body mass index is 19.59 kg/m as calculated from the following:   Height as of an earlier encounter on 06/20/22: 5\' 5"  (1.651 m).   Weight as of an earlier encounter on 06/20/22: 53.4 kg (117 lb 11.2 oz).  LABS: CBC:    Component Value Date/Time   WBC 4.6 06/13/2022 1338   WBC 8.4 06/23/2021 1835   HGB 12.8 06/13/2022 1338   HGB 12.2 12/23/2014 0833   HCT 38.6 06/13/2022 1338   HCT 36.9 12/23/2014 0833   PLT 160 06/13/2022 1338   PLT 150 12/23/2014 0833   MCV 90.0 06/13/2022 1338   MCV 86.5 12/23/2014 0833   NEUTROABS 3.2 06/13/2022  1338   NEUTROABS 3.5 12/23/2014 0833   LYMPHSABS 0.8 06/13/2022 1338   LYMPHSABS 0.7 (L) 12/23/2014 0833   MONOABS 0.4 06/13/2022 1338   MONOABS 0.4 12/23/2014 0833   EOSABS 0.1 06/13/2022 1338   EOSABS 0.3 12/23/2014 0833   BASOSABS 0.0 06/13/2022 1338   BASOSABS 0.1 12/23/2014 0833   Comprehensive Metabolic Panel:    Component Value Date/Time   NA 139  06/13/2022 1338   NA 136 (A) 07/09/2017 0000   NA 140 12/23/2014 0833   K 4.0 06/13/2022 1338   K 4.0 12/23/2014 0833   CL 104 06/13/2022 1338   CO2 32 06/13/2022 1338   CO2 29 12/23/2014 0833   BUN 18 06/13/2022 1338   BUN 15 07/09/2017 0000   BUN 9.5 12/23/2014 0833   CREATININE 0.61 06/13/2022 1338   CREATININE 0.7 12/23/2014 0833   GLUCOSE 94 06/13/2022 1338   GLUCOSE 105 12/23/2014 0833   CALCIUM 9.3 06/13/2022 1338   CALCIUM 9.4 12/23/2014 0833   AST 21 06/13/2022 1338   AST 15 12/23/2014 0833   ALT 18 06/13/2022 1338   ALT 14 12/23/2014 0833   ALKPHOS 95 06/13/2022 1338   ALKPHOS 91 12/23/2014 0833   BILITOT 0.6 06/13/2022 1338   BILITOT 0.53 12/23/2014 0833   PROT 7.0 06/13/2022 1338   PROT 6.8 12/23/2014 0833   ALBUMIN 4.2 06/13/2022 1338   ALBUMIN 3.9 12/23/2014 0833    Present during today's visit: Patient and patient's husband, Karna Abed.  Start plan: 06/23/22    Patient Education I spoke with patient for overview of new oral chemotherapy medication: Tagrisso (osimertinib) for the treatment of metastatic NSCLC, EGFR exon 21 L858R mutated. Planned duration of treatment until disease progression or unacceptable drug toxicity.  CBC w/ Diff and CMP from 06/13/22 assessed - no relevant lab abnormalities noted on CMP or CBC w/ Diff. Baseline EKG obtained in clinic on 06/20/22. Baseline QtcB prolonged (506 ms). Dr. Mosetta Putt aware. Plan is for patient to start on reduced dose of Tagrisso 40 mg by mouth once daily, and then monitor EKG closely (~1 week after therapy initiation).   Administration: Counseled patient  on administration, dosing, side effects, monitoring, drug-food interactions, safe handling, storage, and disposal. Patient will take Tagrisso 40 mg tablets, 1 tablet by mouth daily.  Side Effects: Side effects include but not limited to: diarrhea, rash, mouth sores, fatigue, nail changes. Rare risk of Qtc prolongation also discussed with patient.  Drug-drug Interactions (DDI): No relevant/significant drug-drug interactions with Tagrisso noted.   Adherence: After discussion with patient no patient barriers to medication adherence identified.  Reviewed with patient importance of keeping a medication schedule and plan for any missed doses.  Ms. Gentles voiced understanding and appreciation. All questions answered. Medication handout provided.  Provided patient with Oral Chemotherapy Navigation Clinic phone number. Patient knows to call the office with questions or concerns.   Medication Access Issues: Grants were obtained for patient to help cover out of pocket cost. Patient's out of pocket cost will be $0 after grant applied.   Thank you for allowing me to participate in the care of this very pleasant patient.   Lenord Carbo, PharmD, BCPS, [facility] Hematology/Oncology Clinical Pharmacist [facility] Oral Chemotherapy Navigation Clinics [phone] 06/20/2022 3:28 PM

## 2022-06-20 NOTE — Telephone Encounter (Signed)
Oral Oncology Patient Advocate Encounter  Prior Authorization for [name] has been approved.    PA# [id number] Effective dates: 06/20/22 through 06/04/23  Patients co-pay is $3,328.26.    Jinger Neighbors, CPhT-Adv Oncology Pharmacy Patient Advocate [facility] Direct Number: [phone]  Fax: [phone]

## 2022-06-20 NOTE — Telephone Encounter (Signed)
Oral Oncology Patient Advocate Encounter   Was successful in securing patient a $5,000 grant from [employer] (PA[employer]) to provide copayment coverage for Tagrisso.  This will keep the out of pocket expense at $0.     I have spoken with the patient.    The billing information is as follows and has been shared with [employer].   RxBin: [id number] PCN:  [id number] Member ID: [id number] Group ID: [id number] Dates of Eligibility: 06/20/22 through 06/20/23  Melissa Snyder, CPhT-Adv Oncology Pharmacy Patient Advocate [facility] Direct Number: [phone]  Fax: [phone]

## 2022-06-20 NOTE — Telephone Encounter (Signed)
Oral Oncology Patient Advocate Encounter   Received notification that prior authorization for Tagrisso is required.   PA submitted on 06/20/22 Key [id number] Status is pending     Jinger Neighbors, CPhT-Adv Oncology Pharmacy Patient Advocate [facility] Direct Number: [phone]  Fax: [phone]

## 2022-06-21 ENCOUNTER — Telehealth: Payer: Self-pay | Admitting: Hematology

## 2022-06-21 NOTE — Telephone Encounter (Signed)
Left  patient a vm regarding added appointments on 2/1

## 2022-06-22 ENCOUNTER — Encounter: Payer: Self-pay | Admitting: Hematology

## 2022-06-22 ENCOUNTER — Encounter (HOSPITAL_COMMUNITY): Payer: Self-pay | Admitting: Hematology

## 2022-06-26 ENCOUNTER — Encounter: Payer: Self-pay | Admitting: Hematology

## 2022-06-26 LAB — GUARDANT 360

## 2022-06-29 DIAGNOSIS — Z883 Allergy status to other anti-infective agents status: Secondary | ICD-10-CM | POA: Diagnosis not present

## 2022-06-29 DIAGNOSIS — Z8249 Family history of ischemic heart disease and other diseases of the circulatory system: Secondary | ICD-10-CM | POA: Diagnosis not present

## 2022-06-29 DIAGNOSIS — E039 Hypothyroidism, unspecified: Secondary | ICD-10-CM | POA: Diagnosis not present

## 2022-06-29 DIAGNOSIS — K219 Gastro-esophageal reflux disease without esophagitis: Secondary | ICD-10-CM | POA: Diagnosis not present

## 2022-06-29 DIAGNOSIS — Z809 Family history of malignant neoplasm, unspecified: Secondary | ICD-10-CM | POA: Diagnosis not present

## 2022-06-29 DIAGNOSIS — C3490 Malignant neoplasm of unspecified part of unspecified bronchus or lung: Secondary | ICD-10-CM | POA: Diagnosis not present

## 2022-06-29 DIAGNOSIS — J309 Allergic rhinitis, unspecified: Secondary | ICD-10-CM | POA: Diagnosis not present

## 2022-06-29 DIAGNOSIS — I10 Essential (primary) hypertension: Secondary | ICD-10-CM | POA: Diagnosis not present

## 2022-06-29 DIAGNOSIS — H9193 Unspecified hearing loss, bilateral: Secondary | ICD-10-CM | POA: Diagnosis not present

## 2022-06-29 DIAGNOSIS — M1990 Unspecified osteoarthritis, unspecified site: Secondary | ICD-10-CM | POA: Diagnosis not present

## 2022-06-29 DIAGNOSIS — Z8673 Personal history of transient ischemic attack (TIA), and cerebral infarction without residual deficits: Secondary | ICD-10-CM | POA: Diagnosis not present

## 2022-06-29 DIAGNOSIS — Z008 Encounter for other general examination: Secondary | ICD-10-CM | POA: Diagnosis not present

## 2022-06-29 DIAGNOSIS — Z833 Family history of diabetes mellitus: Secondary | ICD-10-CM | POA: Diagnosis not present

## 2022-07-03 ENCOUNTER — Telehealth: Payer: Self-pay

## 2022-07-03 NOTE — Telephone Encounter (Signed)
Pt called stating she has a CT Scan scheduled and she's to be NPO 6 hrs prior to her CT Scan but she takes her PO Chemo everyday at 0930.  Pt asked if she should reschedule her CT Scan since it will conflict with her PO Chemo administration time.  Returned pt's call and informed pt that she can take the PO Chemo after she complete her CT Scan that day.  Pt was told by CT Scan Scheduler that she could have sips of water but pt stated she does not recall the person stating she could take her meds.  Pt stated she will just take her medication after she has her CT Scan done that day.  Pt had no further questions or concerns.  Notified Dr. Burr Medico of the pt's call.

## 2022-07-04 ENCOUNTER — Other Ambulatory Visit (HOSPITAL_COMMUNITY): Payer: Self-pay

## 2022-07-04 ENCOUNTER — Ambulatory Visit (HOSPITAL_COMMUNITY)
Admission: RE | Admit: 2022-07-04 | Discharge: 2022-07-04 | Disposition: A | Discharge status: Home / Self Care | Payer: Medicare HMO | Source: Ambulatory Visit | Attending: Hematology | Admitting: Hematology

## 2022-07-04 DIAGNOSIS — C3432 Malignant neoplasm of lower lobe, left bronchus or lung: Secondary | ICD-10-CM | POA: Insufficient documentation

## 2022-07-04 DIAGNOSIS — G9389 Other specified disorders of brain: Secondary | ICD-10-CM | POA: Diagnosis not present

## 2022-07-04 MED ORDER — GADOBUTROL 1 MMOL/ML IV SOLN
5.0000 mL | Freq: Once | INTRAVENOUS | Status: AC | PRN
  Administered 2022-07-04: 5 mL via INTRAVENOUS

## 2022-07-05 ENCOUNTER — Other Ambulatory Visit: Payer: Self-pay

## 2022-07-05 ENCOUNTER — Encounter: Payer: Self-pay | Admitting: Hematology

## 2022-07-05 ENCOUNTER — Inpatient Hospital Stay: Payer: Medicare HMO

## 2022-07-05 ENCOUNTER — Inpatient Hospital Stay: Payer: Medicare HMO | Attending: Hematology | Admitting: Hematology

## 2022-07-05 VITALS — BP 162/72 | HR 68 | Temp 97.6°F | Resp 16 | Ht 65.0 in | Wt 114.3 lb

## 2022-07-05 DIAGNOSIS — J910 Malignant pleural effusion: Secondary | ICD-10-CM | POA: Insufficient documentation

## 2022-07-05 DIAGNOSIS — Z170 Estrogen receptor positive status [ER+]: Secondary | ICD-10-CM

## 2022-07-05 DIAGNOSIS — C3432 Malignant neoplasm of lower lobe, left bronchus or lung: Secondary | ICD-10-CM

## 2022-07-05 DIAGNOSIS — C3412 Malignant neoplasm of upper lobe, left bronchus or lung: Secondary | ICD-10-CM | POA: Diagnosis not present

## 2022-07-05 DIAGNOSIS — Z853 Personal history of malignant neoplasm of breast: Secondary | ICD-10-CM | POA: Diagnosis not present

## 2022-07-05 LAB — CMP (CANCER CENTER ONLY)
ALT: 26 U/L (ref 0–44)
AST: 25 U/L (ref 15–41)
Albumin: 3.9 g/dL (ref 3.5–5.0)
Alkaline Phosphatase: 85 U/L (ref 38–126)
Anion gap: 5 (ref 5–15)
BUN: 18 mg/dL (ref 8–23)
CO2: 32 mmol/L (ref 22–32)
Calcium: 9.2 mg/dL (ref 8.9–10.3)
Chloride: 104 mmol/L (ref 98–111)
Creatinine: 0.71 mg/dL (ref 0.44–1.00)
GFR, Estimated: >60 mL/min (ref >60)
Glucose, Bld: 87 mg/dL (ref 70–99)
Potassium: 4.1 mmol/L (ref 3.5–5.1)
Sodium: 141 mmol/L (ref 135–145)
Total Bilirubin: 0.6 mg/dL (ref 0.3–1.2)
Total Protein: 6.9 g/dL (ref 6.5–8.1)

## 2022-07-05 LAB — CBC WITH DIFFERENTIAL (CANCER CENTER ONLY)
Abs Immature Granulocytes: 0.01 10*3/uL (ref 0.00–0.07)
Basophils Absolute: 0 10*3/uL (ref 0.0–0.1)
Basophils Relative: 1 %
Eosinophils Absolute: 0.1 10*3/uL (ref 0.0–0.5)
Eosinophils Relative: 4 %
HCT: 36.7 % (ref 36.0–46.0)
Hemoglobin: 12.4 g/dL (ref 12.0–15.0)
Immature Granulocytes: 0 %
Lymphocytes Relative: 17 %
Lymphs Abs: 0.6 10*3/uL — ABNORMAL LOW (ref 0.7–4.0)
MCH: 30.3 pg (ref 26.0–34.0)
MCHC: 33.8 g/dL (ref 30.0–36.0)
MCV: 89.7 fL (ref 80.0–100.0)
Monocytes Absolute: 0.4 10*3/uL (ref 0.1–1.0)
Monocytes Relative: 11 %
Neutro Abs: 2.4 10*3/uL (ref 1.7–7.7)
Neutrophils Relative %: 67 %
Platelet Count: 91 10*3/uL — ABNORMAL LOW (ref 150–400)
RBC: 4.09 MIL/uL (ref 3.87–5.11)
RDW: 14.2 % (ref 11.5–15.5)
WBC Count: 3.6 10*3/uL — ABNORMAL LOW (ref 4.0–10.5)
nRBC: 0 % (ref 0.0–0.2)

## 2022-07-05 NOTE — Progress Notes (Signed)
[place]   [phone] Fax:([phone]) [phone]   Clinic Follow up Note   Patient Care Team: [name], MD as PCP - General (Internal Medicine) [name], MD as Consulting Physician (Cardiothoracic Surgery) [name], MD as Consulting Physician (Urology) [name], MD as Consulting Physician (Hematology) [name], MD as Consulting Physician (Radiation Oncology) Hayden Pedro, PA-C as Physician Assistant (Radiation Oncology) Imaging, [facility] as Consulting Physician (Diagnostic Radiology)  Date of Service:  07/05/2022  CHIEF COMPLAINT: f/u of recurrent lung cancer, h/o breast cancer    CURRENT THERAPY:  Osimertinib  Started 06/23/2022  ASSESSMENT:  Melissa Snyder is a 89 y.o. female with   Primary cancer of left lower lobe of lung ([place]) Stage I left lung adenocarcinoma, pT1aN0M0 in 2016, LLL recurrence in 06/2017, LUL recurrence in 10/2020, malignant left pleural effusion in 04/2022 -initially diagnosed in 11/2014, s/p LLL segmentectomy by Dr. Roxan Hockey, pathology showing stage I adenocarcinoma -She had cancer recurrence in LLL in 06/2017 and in LUL in 10/2020. She was treated with radiation in 07/2017 and in 11/2020 with Dr. Lisbeth Renshaw.  -surveillance chest CT on 04/06/22 showed stable lung change, no evidence of recurrence. Also noted was a small left pleural effusion, increased compared to prior.  -She underwent left thoracentesis on 05/02/22 and unfortunately cytology was positive for adenocarcinoma, TTF-1 positive, consistent with recurrent lung adenocarcinoma. -I discussed this is unfortunately not curable disease, I will obtain a PET scan to see if there are any other distant metastasis, and brain MRI -Foundation One showed EGFR L858R mutation(+), which indicating good response to EGFR inhibitor, I have started her on osimertinib on ** -she has slightly prolonged QTc, will monitor closely when she is on  Osimertinib  -Lab reviewed today, she has mild leukopenia and thrombocytopenia, likely related to Tegretol.  Her EKG showed normal QTc -She is tolerating treatment well, will continue, follow-up in a months. -I reviewed her brain MRI, which was negative for metastasis.  I discussed with her.  She has a PET scan scheduled for next week.    PLAN: -I reviewed her EKG - lab reviewed - Discuss MRI Brain Scan-negative for metastasis, we discussed the findings of her meningiomas. -f/u in 1 month with lab and EKG    SUMMARY OF ONCOLOGIC HISTORY: Oncology History Overview Note   Cancer Staging  Breast cancer of upper-outer quadrant of right female breast [facility]) Staging form: Breast, AJCC 7th Edition - Clinical stage from 05/19/2014: Stage IA (T1b, N0, M0) - Signed by Holley Bouche, NP on 11/17/2015 - Pathologic stage from 10/17/2014: Stage IA (T1b, N0, cM0) - Signed by [name], MD on 10/17/2014  Primary cancer of left lower lobe of lung [facility]) Staging form: Lung, AJCC 7th Edition - Clinical stage from 12/14/2014: Stage IA (T1a, N0, M0) - Unsigned    Breast cancer of upper-outer quadrant of right female breast ([place])  05/04/2014 Initial Diagnosis   Breast cancer   05/19/2014 Imaging   Diagnostic mammogram and ultrasound showed a 7 mm irregular suspicious mass located in the right breast at the 10:00 position, 7 cm from the nipple. Otherwise negative.   05/19/2014 Initial Biopsy   Right breast biopsy (UOQ): IDC, grade 2.    05/19/2014 Receptors her2   ER 100% positive, PR 35% positive, HER-2 negative, Ki67 8%   06/07/2014 Miscellaneous   Genetic testing normal.  APC, ATM, BARD1, BMPR1A, BRCA1, BRCA2, BRIP1, CDH1, CDK4, CDKN2A, CHEK2, EPCAM, GREM1, MLH1, MRE11A, MSH2,  MSH6, MUTYH, NBN, NF1, PALB2, PMS2, POLD1, POLE, PTEN, RAD50, RAD51D, SMAD4, SMARCA4, STK11, and TP53 tested.    07/23/2014 Surgery   Right breast lumpectomy with SLNB Lucia Gaskins) showed invasive ductal carcinoma & DCIS;  negative surgical margins.   07/23/2014 Pathology Results   Invasive ductal carcinoma, tumor size 0.8 cm, grade 1, no lymphovascular invasion, margins were negative, 2 sentinel lymph nodes were negative. (+) DCIS.   07/23/2014 Pathologic Stage   pT1b, pN0: Stage IA    09/16/2014 - 10/07/2014 Radiation Therapy   XRT completed Pablo Ledger). Right breast/ 42.72 Gy at 2.67 Gy per fraction x 21 fractions.     Anti-estrogen oral therapy   Aromasin prescribed in 01/2015 but Patient declines anti-estrogen therapy at this time d/t high co-pay of medication and possible side effects of AI therapy.    05/02/2015 Mammogram   Diagnostic TOMO bilat mammogram: No evidence of malignancy in either breast. Lumpectomy changes in right breast. Diagnostic mammo suggested in 1 year.    Primary cancer of left lower lobe of lung ([place])  10/25/2013 Imaging   CXR done for stroke protocol. No acute chest findings. Chronic lung disease with biapical scarring. Developing nodule in (L) apex cannot be excluded based on exam, athough may be d/t overlap of osseous structures.    10/27/2013 Imaging   CXR: Nodular opacity in mid (L) apex and appears more prominent than prior study. Area that appeared masslike in LUL near apex on recent CXR not seen at this time.  Underlying emphysema present. No edema or consolidation   11/12/2013 Imaging   CT chest: At left lung apex, there is asymmetric pleural parenchymal scarring. No discrete lesion.  In superior segment of LLL, there are 1 dominant ground-glass opacity measuring 16 mm. 2 addt'l ground-glass opacities appreciated. F/U with CT in 3 months   02/11/2014 Imaging   CT chest: Several small bilat faint nodular densities, largest measures 1.3 cm over superior segment LLL. No new nodules, no adenopathy. Cannot exclude metastatic disease given pt's h/o melanoma. Recommend PET-CT   02/23/2014 PET scan   Persistent semi-solid nodule in superior segment LLL with low metabolic activity.  Moderately metabolic subcarinal & hilar LN are likely reactive.    05/25/2014 Imaging   CT chest: Persistent & similar size dominant LLL sub-solid nodule. Adenocarcinoma is considered. Biopsy or resection strongly considered. Additional scattered small ground-glass nodules stable.    06/02/2014 Imaging   MRI brain: Grossly stable appearance of left planum sphenoidale meningioma measuring 20 x 14 mm. Meningioma does not extend to left orbital apex & contacts left optic nerve. Stable right posterior clinoid meningioma .    09/27/2014 Imaging   CT chest: Further enlargment of central solid component in sub-solid LLL pulm nodule. Findings remain concerning for adenoca. Additional scattered small bilat ground-glass nodules and biapical scarring stable. No adenopathy.     11/25/2014 Surgery   Video bronchoscopy with endobronchial ultrasound; Video assisted thoracoscopy; Thoracoscopic LLL superior segmentectomy; Mediastinal lymph node dissection; Cyro intercostal nerve block Roxan Hockey)   11/25/2014 Pathology Results   Superior segment lung resection (LLL). Adenocarcinoma, well-diff, spanning 1.5 cm. Surgical margins negative. 5 LNs negative.    11/25/2014 Pathologic Stage   pT1a, pN0: Stage IA    11/25/2014 Initial Diagnosis   Primary cancer of left lower lobe of lung ([place])   05/31/2015 Imaging   CT chest: No evidence of local recurrent at superior segmentectomy in LLL. New small nodular focus of consolidation surrounding thin parenchymal band. Additional sub-cm ground-glass and solid nodules  favored to be benign. No thoracic adenopathy   08/11/2015 Imaging   CT chest: No evidence of residual or recurrent tumor in LLL. Stable small nodular focus in LLL with thin surrounding parenchymal band, favored to be benign.  Multiple small ground-glass nodules unchanged.    06/19/2017 Pathology Results   Diagnosis 1. Lung, biopsy, Left lower lobe - ADENOCARCINOMA. - SEE COMMENT. 2. Lung, biopsy, Left  lower lobe - BENIGN LUNG PARENCHYMA. - THERE IS NO EVIDENCE OF MALIGNANCY.   07/09/2017 - 07/22/2017 Radiation Therapy   Radiation treatment dates: 07/09/17-07/22/17 by Dr. Lisbeth Renshaw    Site/dose:  Left lung/ 50 Gy in 10 fractions   Beams/energy:  IMRT/ 6X   Narrative: The patient tolerated radiation treatment relatively well. She denied pain, fatigue, or difficulty swallowing. Skin to the treatment area appears warm, dry, and normal in color.   06/18/2019 Imaging   CT Chest  IMPRESSION: 1. There are 2 round solid nodules within the left lower lobe which have increased in size when compared with the previous exam, suspicious. Additionally, along the area of postsurgical change around the oblique fissure of the left lung there is progressive thickening with persistent loculated fluid. Cannot rule out local tumor recurrence along the suture line. 2. No significant change in the appearance of bilateral upper lobe sub solid lung nodules. 3. Aortic Atherosclerosis (ICD10-I70.0). Coronary artery calcifications.     07/07/2019 PET scan   IMPRESSION: 1. No evidence of hypermetabolic local tumor recurrence in the left lung. Low level nonfocal uptake along the segmentectomy site in the left lower lobe favors post treatment change. Continued chest CT surveillance warranted. 2. Two small solid left lower lobe pulmonary nodules, largest 0.7 cm, below PET resolution. Given the growth of these nodules since 10/03/2018 chest CT, metastatic disease remains on the differential. Close chest CT surveillance recommended. 3. Nonspecific hypermetabolism within nonenlarged subcarinal and bilateral hilar lymph nodes, not substantially changed since 03/04/2017 PET-CT study, favoring reactive uptake. 4. No hypermetabolic distant metastatic disease. 5. Chronic findings include: Aortic Atherosclerosis (ICD10-I70.0). Subcentimeter ground-glass pulmonary nodules in the upper lobes are unchanged and are below PET  resolution. Moderate sigmoid diverticulosis. Chronic right maxillary sinusitis.   04/13/2020 Imaging   CT Chest  IMPRESSION: 1. Primarily similar appearance of the lungs compared to 10/14/2019. Left sided surgical sutures with similar solid and subsolid pulmonary nodules as detailed above. A right middle lobe pleural based 5 mm nodule is new since the prior exam, warranting follow-up attention. 2. No thoracic adenopathy. 3. Aortic atherosclerosis (ICD10-I70.0), coronary artery atherosclerosis and emphysema (ICD10-J43.9).   10/10/2020 Imaging   CT chest  IMPRESSION: Postsurgical and post radiation changes in the lungs bilaterally, as above.   12 x 10 mm left lower lobe nodule, mildly progressive. Additional bilobed nodule in the posterior left lower lobe, also favored to be progressive. PET-CT is suggested for further evaluation.   Aortic Atherosclerosis (ICD10-I70.0) and Emphysema (ICD10-J43.9).   11/22/2020 - 12/02/2020 Radiation Therapy   Site Technique Total Dose (Gy) Dose per Fx (Gy) Completed Fx Beam Energies  Lung, Left: Lung_Lt_LLL IMRT 60/60 12 5/5 6XFFF     05/22/2021 Imaging   EXAM: CT CHEST WITHOUT CONTRAST  IMPRESSION: 1. Interval development of a new area of volume loss, ground-glass opacity, and bronchiectasis in the posterior left lower lobe. Imaging features are compatible with radiation therapy. 2. The posterior left lung nodules, including index 12 x 10 mm nodule described previously have become incorporated into the post treatment change on the current study. 3.  Interval resolution of the posterior right middle lobe nodularity seen previously. 4. Other scattered areas of consolidative opacity or similar. 5. Trace left pleural effusion. 6. Aortic Atherosclerosis (ICD10-I70.0).      INTERVAL HISTORY:  Melissa Snyder is here for a follow up of  recurrent lung cancer, h/o breast cancer  She was last seen by me on 06/20/2022 She presents to the clinic  accompanied by husband. Pt states she feels a different, she denies having nausea.      All other systems were reviewed with the patient and are negative.  MEDICAL HISTORY:  Past Medical History:  Diagnosis Date   Adenocarcinoma ([place])    recurrent/notes 07/03/2017   Blind left eye 10/2013   cva   Breast cancer ([place]) 05/2014   ER+/PR+/Her2-     LUNG CANCER   Bundle branch block left    Chest pain    Family history of bladder cancer    Family history of breast cancer    Family history of prostate cancer    GERD (gastroesophageal reflux disease)    History of blood transfusion    age 40   Hyperlipidemia    Hypertension    Hypothyroid    Lung nodule    Melanoma in situ of face ([place]) JUNE 2015   LEFT CHEEK   Meningioma ([place])    brain lining   Migraine    reports only having one   Osteoarthritis    Personal history of radiation therapy    Radiation 09/16/14-10/07/14   Right  Breast  21 fractions   Skin cancer    Stroke ([place]) 2015   Blind in left eye as a result    SURGICAL HISTORY: Past Surgical History:  Procedure Laterality Date   ABDOMINAL HYSTERECTOMY  ~ 1997   APPENDECTOMY     age 9   BILATERAL SALPINGOOPHORECTOMY     BLADDER REPAIR  2009   cystocele   BRAIN MENINGIOMA EXCISION     Gamma knife to Meningioma in brain lining   BREAST BIOPSY     BREAST LUMPECTOMY Right 07/23/2014   invasive ductal   BUNIONECTOMY     bilateral great toe   CARDIAC CATHETERIZATION  2004   CHOLECYSTECTOMY  ~ [place] BLOCK Left 11/25/2014   Procedure: CRYO INTERCOSTAL NERVE BLOCK;  Surgeon: [name], MD;  Location: [place];  Service: Thoracic;  Laterality: Left;   CT RADIATION THERAPY GUIDE     Gamma radiation -lt frontal-[facility]   DILATION AND CURETTAGE OF UTERUS     X 2   HIP ARTHROPLASTY Left 07/04/2017   Procedure: ARTHROPLASTY BIPOLAR HIP (HEMIARTHROPLASTY);  Surgeon: Tania Ade, MD;  Location: [place];  Service:  Orthopedics;  Laterality: Left;   MELANOMA EXCISION Left 11/05/23, 11/30/13   CHEEK   SEGMENTECOMY Left 11/25/2014   Procedure: LEFT LOWER LOBE SEGMENTECTOMY;  Surgeon: [name], MD;  Location: [place];  Service: Thoracic;  Laterality: Left;   VIDEO ASSISTED THORACOSCOPY Left 11/25/2014   Procedure: VIDEO ASSISTED THORACOSCOPY;  Surgeon: [name], MD;  Location: [place];  Service: Thoracic;  Laterality: Left;   VIDEO BRONCHOSCOPY WITH ENDOBRONCHIAL NAVIGATION N/A 06/19/2017   Procedure: VIDEO BRONCHOSCOPY;  Surgeon: [name], MD;  Location: [place];  Service: Thoracic;  Laterality: N/A;   VIDEO BRONCHOSCOPY WITH ENDOBRONCHIAL ULTRASOUND N/A 11/25/2014   Procedure: VIDEO BRONCHOSCOPY WITH ENDOBRONCHIAL ULTRASOUND;  Surgeon: [name], MD;  [facility];  Service: Thoracic;  Laterality: N/A;    I have reviewed the social history and family history with the patient and they are unchanged from previous note.  ALLERGIES:  is allergic to beef-derived products, other, chocolate, ibuprofen, niacin and related, and tramadol.  MEDICATIONS:  Current Outpatient Medications  Medication Sig Dispense Refill   aspirin 81 MG EC tablet      atenolol (TENORMIN) 50 MG tablet Take 50 mg by mouth at bedtime.      b complex vitamins capsule Take 1 capsule by mouth daily.     BINAXNOW COVID-19 AG HOME TEST KIT See admin instructions.     calcium carbonate (OSCAL) 1500 (600 Ca) MG TABS tablet      calcium-vitamin D (OSCAL WITH D) 500-200 MG-UNIT per tablet Take 1 tablet by mouth at bedtime.      Cholecalciferol (VITAMIN D3) 1000 units CAPS Take by mouth daily.     denosumab (PROLIA) 60 MG/ML SOSY injection      Digestive Enzymes (ENZYME DIGEST PO) Take 5 mLs by mouth 3 (three) times daily with meals.     FLUZONE HIGH-DOSE QUADRIVALENT 0.7 ML SUSY      levothyroxine (SYNTHROID, LEVOTHROID) 112 MCG tablet Take 112 mcg by mouth daily before breakfast.      lisinopril  (PRINIVIL,ZESTRIL) 10 MG tablet Take 1 tablet (10 mg total) by mouth daily. (Patient taking differently: Take 10 mg by mouth 2 (two) times daily.) 30 tablet 1   meclizine (ANTIVERT) 12.5 MG tablet Take 12.5 mg by mouth 3 (three) times daily as needed for dizziness.     Multiple Vitamin (MULTIVITAMIN) tablet Take 1 tablet by mouth every morning.      Omega-3 1000 MG CAPS Take 1 g by mouth daily.     osimertinib mesylate (TAGRISSO) 40 MG tablet Take 1 tablet (40 mg total) by mouth daily. 30 tablet 0   Polyvinyl Alcohol-Povidone (REFRESH OP) Apply 1 drop to eye as needed (DRY EYES).      Triamcinolone Acetonide (NASACORT AQ NA) Place 1 spray into the nose at bedtime as needed (CONGESTION).     vitamin E 180 MG (400 UNITS) capsule Take 400 Units by mouth every Monday, Wednesday, and Friday. Mon Wed Fri     No current facility-administered medications for this visit.    PHYSICAL EXAMINATION: ECOG PERFORMANCE STATUS: 1 - Symptomatic but completely ambulatory  Vitals:   07/05/22 1357  BP: (!) 162/72  Pulse: 68  Resp: 16  Temp: 97.6 F (36.4 C)  SpO2: 100%   Wt Readings from Last 3 Encounters:  07/05/22 114 lb 4.8 oz (51.8 kg)  06/20/22 117 lb 11.2 oz (53.4 kg)  06/13/22 114 lb 14.4 oz (52.1 kg)     GENERAL:alert, no distress and comfortable SKIN: skin color, texture, turgor are normal, no rashes or significant lesions EYES: normal, Conjunctiva are pink and non-injected, sclera clear NECK: supple, thyroid normal size, non-tender, without nodularity LYMPH:  no palpable lymphadenopathy in the cervical, axillary  LUNGS: (-) clear to auscultation and percussion with normal breathing effort ;eft Lung a little lower. HEART: regular rate & rhythm and no murmurs and no lower extremity edema ABDOMEN:abdomen soft, non-tender and normal bowel sounds Musculoskeletal:no cyanosis of digits and no clubbing  NEURO: alert & oriented x 3 with fluent speech, no focal motor/sensory deficits  LABORATORY  DATA:  I have reviewed the data as listed    Latest Ref Rng & Units 07/05/2022   11:38 AM 06/13/2022  1:38 PM 04/04/2022   10:00 AM  CBC  WBC 4.0 - 10.5 K/uL 3.6  4.6  3.9   Hemoglobin 12.0 - 15.0 g/dL 12.4  12.8  13.2   Hematocrit 36.0 - 46.0 % 36.7  38.6  39.4   Platelets 150 - 400 K/uL 91  160  151         Latest Ref Rng & Units 07/05/2022   11:38 AM 06/13/2022    1:38 PM 04/04/2022   10:00 AM  CMP  Glucose 70 - 99 mg/dL 87  94  137   BUN 8 - 23 mg/dL 18  18  18    Creatinine 0.44 - 1.00 mg/dL 0.71  0.61  0.74   Sodium 135 - 145 mmol/L 141  139  140   Potassium 3.5 - 5.1 mmol/L 4.1  4.0  4.2   Chloride 98 - 111 mmol/L 104  104  103   CO2 22 - 32 mmol/L 32  32  32   Calcium 8.9 - 10.3 mg/dL 9.2  9.3  9.3   Total Protein 6.5 - 8.1 g/dL 6.9  7.0  7.3   Total Bilirubin 0.3 - 1.2 mg/dL 0.6  0.6  0.6   Alkaline Phos 38 - 126 U/L 85  95  96   AST 15 - 41 U/L 25  21  23    ALT 0 - 44 U/L 26  18  20        RADIOGRAPHIC STUDIES: I have personally reviewed the radiological images as listed and agreed with the findings in the report. MR Brain W Wo Contrast  Result Date: 07/04/2022 CLINICAL DATA:  Non-small cell lung cancer staging. Also history of breast cancer in previous gamma knife treatment for meningioma. EXAM: MRI HEAD WITHOUT AND WITH CONTRAST TECHNIQUE: Multiplanar, multiecho pulse sequences of the brain and surrounding structures were obtained without and with intravenous contrast. CONTRAST:  45mL GADAVIST GADOBUTROL 1 MMOL/ML IV SOLN COMPARISON:  06/02/2014 multiple older films as distant as 2010. FINDINGS: Brain: Diffusion imaging does not show any acute or subacute infarction or other cause of restricted diffusion. Mild chronic small-vessel ischemic change affects the pons. No focal cerebellar finding. Cerebral hemispheres show moderate chronic small-vessel ischemic changes of the white matter. No sign of regional metastatic disease. No hydrocephalus or extra-axial collection.  Residual meningioma arising from the clinoid process/posterior planum sphenoidale on the left is smaller than was seen in 2015, maximal dimension 15 mm compared with 20 mm at that time. No evidence of growth or new extension. Small nodule of meningioma tissue arising from the posterior clinoid process on the right is also slightly smaller, 5 mm today compared with 6 x 7 mm previously. En plaque meningioma along the lateral upper right frontal convexity has grown. Previously this showed maximal thickness 5 mm. Today, there are lobular areas showing thickness of up to 10 mm. The lesion covers a region of about 4-5 cm of the convexity dura. Mild/minimal mass-effect upon the right frontal lobe but without any edema or shift. Vascular: Major vessels at the base of the brain show flow. Skull and upper cervical spine: Negative Sinuses/Orbits: Chronically small right maxillary sinus. Mucosal thickening of the sphenoid sinus without layering fluid. Orbits negative Other: None IMPRESSION: 1. No evidence of metastatic disease. 2. Chronic small-vessel ischemic changes of the pons and cerebral hemispheric white matter. 3. Residual meningioma arising from the clinoid process/posterior planum sphenoidale on the left is smaller than was seen in 2015. No evidence of growth or  new extension. 4. Interval enlargement of en plaque meningioma along the lateral upper right frontal convexity, now measuring up to 10 mm in thickness. Minimal mass-effect upon the right frontal lobe but without any edema or shift. 5. Small nodule of meningioma tissue arising from the posterior clinoid process on the right is slightly smaller, 5 mm today. 6. Chronically small right maxillary sinus. Mucosal thickening of the sphenoid sinus without layering fluid. Electronically Signed   By: Nelson Chimes M.D.   On: 07/04/2022 14:02      No orders of the defined types were placed in this encounter.  All questions were answered. The patient knows to call the  clinic with any problems, questions or concerns. No barriers to learning was detected. The total time spent in the appointment was 30 minutes.     [name], MD 07/05/2022   Felicity Coyer, CMA, am acting as scribe for [name], MD.   I have reviewed the above documentation for accuracy and completeness, and I agree with the above.

## 2022-07-05 NOTE — Assessment & Plan Note (Signed)
Stage I left lung adenocarcinoma, pT1aN0M0 in 2016, LLL recurrence in 06/2017, LUL recurrence in 10/2020, malignant left pleural effusion in 04/2022 -initially diagnosed in 11/2014, s/p LLL segmentectomy by Dr. Roxan Hockey, pathology showing stage I adenocarcinoma -She had cancer recurrence in LLL in 06/2017 and in LUL in 10/2020. She was treated with radiation in 07/2017 and in 11/2020 with Dr. Lisbeth Renshaw.  -surveillance chest CT on 04/06/22 showed stable lung change, no evidence of recurrence. Also noted was a small left pleural effusion, increased compared to prior.  -She underwent left thoracentesis on 05/02/22 and unfortunately cytology was positive for adenocarcinoma, TTF-1 positive, consistent with recurrent lung adenocarcinoma. -I discussed this is unfortunately not curable disease, I will obtain a PET scan to see if there are any other distant metastasis, and brain MRI -Foundation One showed EGFR L858R mutation(+), which indicating good response to EGFR inhibitor, I have started her on osimertinib on ** -she has slightly prolonged QTc, will monitor closely when she is on Osimertinib

## 2022-07-11 ENCOUNTER — Other Ambulatory Visit (HOSPITAL_COMMUNITY): Payer: Self-pay

## 2022-07-13 ENCOUNTER — Other Ambulatory Visit: Payer: Self-pay

## 2022-07-13 ENCOUNTER — Other Ambulatory Visit: Payer: Self-pay | Admitting: Hematology

## 2022-07-13 ENCOUNTER — Other Ambulatory Visit (HOSPITAL_COMMUNITY): Payer: Self-pay

## 2022-07-13 ENCOUNTER — Encounter (HOSPITAL_COMMUNITY)
Admission: RE | Admit: 2022-07-13 | Discharge: 2022-07-13 | Disposition: A | Discharge status: Home / Self Care | Payer: Medicare HMO | Source: Ambulatory Visit | Attending: Hematology | Admitting: Hematology

## 2022-07-13 DIAGNOSIS — C3432 Malignant neoplasm of lower lobe, left bronchus or lung: Secondary | ICD-10-CM | POA: Diagnosis not present

## 2022-07-13 DIAGNOSIS — J90 Pleural effusion, not elsewhere classified: Secondary | ICD-10-CM | POA: Diagnosis not present

## 2022-07-13 DIAGNOSIS — I2510 Atherosclerotic heart disease of native coronary artery without angina pectoris: Secondary | ICD-10-CM | POA: Diagnosis not present

## 2022-07-13 DIAGNOSIS — C3490 Malignant neoplasm of unspecified part of unspecified bronchus or lung: Secondary | ICD-10-CM | POA: Diagnosis not present

## 2022-07-13 DIAGNOSIS — J8410 Pulmonary fibrosis, unspecified: Secondary | ICD-10-CM | POA: Diagnosis not present

## 2022-07-13 LAB — GLUCOSE, CAPILLARY: Glucose-Capillary: 99 mg/dL (ref 70–99)

## 2022-07-13 MED ORDER — FLUDEOXYGLUCOSE F - 18 (FDG) INJECTION
5.7000 | Freq: Once | INTRAVENOUS | Status: AC
  Administered 2022-07-13: 5.7 via INTRAVENOUS

## 2022-07-15 ENCOUNTER — Other Ambulatory Visit: Payer: Self-pay | Admitting: Hematology

## 2022-07-15 MED ORDER — OSIMERTINIB MESYLATE 40 MG PO TABS
40.0000 mg | ORAL_TABLET | Freq: Every day | ORAL | 0 refills | Status: DC
  Filled 2022-07-15: qty 30, 30d supply, fill #0

## 2022-07-16 ENCOUNTER — Telehealth: Payer: Self-pay

## 2022-07-16 ENCOUNTER — Other Ambulatory Visit: Payer: Self-pay

## 2022-07-16 NOTE — Telephone Encounter (Addendum)
Called patient and relayed message below. Patient voiced understanding.    ----- Message from [name], MD sent at 07/15/2022 11:46 AM EST ----- Please let pt know that her PET scan looks ok, I will review with her on her next office visit. If her daughter wants to know the results, she can come in with her or be available by phone when I see her in office.   [name]

## 2022-07-18 ENCOUNTER — Other Ambulatory Visit (HOSPITAL_COMMUNITY): Payer: Self-pay

## 2022-08-02 ENCOUNTER — Other Ambulatory Visit: Payer: Self-pay

## 2022-08-02 ENCOUNTER — Inpatient Hospital Stay: Payer: Medicare HMO | Admitting: Hematology

## 2022-08-02 ENCOUNTER — Inpatient Hospital Stay: Payer: Medicare HMO

## 2022-08-02 ENCOUNTER — Encounter: Payer: Self-pay | Admitting: Hematology

## 2022-08-02 VITALS — BP 183/75 | HR 68 | Temp 98.2°F | Resp 18 | Ht 65.0 in | Wt 115.3 lb

## 2022-08-02 DIAGNOSIS — C3432 Malignant neoplasm of lower lobe, left bronchus or lung: Secondary | ICD-10-CM

## 2022-08-02 DIAGNOSIS — Z170 Estrogen receptor positive status [ER+]: Secondary | ICD-10-CM

## 2022-08-02 DIAGNOSIS — J910 Malignant pleural effusion: Secondary | ICD-10-CM | POA: Diagnosis not present

## 2022-08-02 DIAGNOSIS — Z853 Personal history of malignant neoplasm of breast: Secondary | ICD-10-CM | POA: Diagnosis not present

## 2022-08-02 DIAGNOSIS — C3412 Malignant neoplasm of upper lobe, left bronchus or lung: Secondary | ICD-10-CM | POA: Diagnosis not present

## 2022-08-02 LAB — CBC WITH DIFFERENTIAL (CANCER CENTER ONLY)
Abs Immature Granulocytes: 0.01 10*3/uL (ref 0.00–0.07)
Basophils Absolute: 0 10*3/uL (ref 0.0–0.1)
Basophils Relative: 0 %
Eosinophils Absolute: 0.1 10*3/uL (ref 0.0–0.5)
Eosinophils Relative: 3 %
HCT: 37.1 % (ref 36.0–46.0)
Hemoglobin: 12.2 g/dL (ref 12.0–15.0)
Immature Granulocytes: 0 %
Lymphocytes Relative: 14 %
Lymphs Abs: 0.6 10*3/uL — ABNORMAL LOW (ref 0.7–4.0)
MCH: 29.5 pg (ref 26.0–34.0)
MCHC: 32.9 g/dL (ref 30.0–36.0)
MCV: 89.8 fL (ref 80.0–100.0)
Monocytes Absolute: 0.5 10*3/uL (ref 0.1–1.0)
Monocytes Relative: 11 %
Neutro Abs: 3.2 10*3/uL (ref 1.7–7.7)
Neutrophils Relative %: 72 %
Platelet Count: 96 10*3/uL — ABNORMAL LOW (ref 150–400)
RBC: 4.13 MIL/uL (ref 3.87–5.11)
RDW: 13.5 % (ref 11.5–15.5)
WBC Count: 4.5 10*3/uL (ref 4.0–10.5)
nRBC: 0 % (ref 0.0–0.2)

## 2022-08-02 LAB — CMP (CANCER CENTER ONLY)
ALT: 23 U/L (ref 0–44)
AST: 25 U/L (ref 15–41)
Albumin: 4.1 g/dL (ref 3.5–5.0)
Alkaline Phosphatase: 81 U/L (ref 38–126)
Anion gap: 5 (ref 5–15)
BUN: 19 mg/dL (ref 8–23)
CO2: 31 mmol/L (ref 22–32)
Calcium: 8.7 mg/dL — ABNORMAL LOW (ref 8.9–10.3)
Chloride: 104 mmol/L (ref 98–111)
Creatinine: 0.63 mg/dL (ref 0.44–1.00)
GFR, Estimated: >60 mL/min (ref >60)
Glucose, Bld: 95 mg/dL (ref 70–99)
Potassium: 4.7 mmol/L (ref 3.5–5.1)
Sodium: 140 mmol/L (ref 135–145)
Total Bilirubin: 0.5 mg/dL (ref 0.3–1.2)
Total Protein: 6.5 g/dL (ref 6.5–8.1)

## 2022-08-02 MED ORDER — OSIMERTINIB MESYLATE 80 MG PO TABS
80.0000 mg | ORAL_TABLET | Freq: Every day | ORAL | 0 refills | Status: DC
  Filled 2022-08-02 – 2022-08-06 (×2): qty 30, 30d supply, fill #0

## 2022-08-02 NOTE — Assessment & Plan Note (Signed)
Stage I left lung adenocarcinoma, pT1aN0M0 in 2016, LLL recurrence in 06/2017, LUL recurrence in 10/2020, malignant left pleural effusion in 04/2022 -initially diagnosed in 11/2014, s/p LLL segmentectomy by Dr. Roxan Hockey, pathology showing stage I adenocarcinoma -She had cancer recurrence in LLL in 06/2017 and in LUL in 10/2020. She was treated with radiation in 07/2017 and in 11/2020 with Dr. Lisbeth Renshaw.  -surveillance chest CT on 04/06/22 showed stable lung change, no evidence of recurrence. Also noted was a small left pleural effusion, increased compared to prior.  -She underwent left thoracentesis on 05/02/22 and unfortunately cytology was positive for adenocarcinoma, TTF-1 positive, consistent with recurrent lung adenocarcinoma. -I discussed this is unfortunately not curable disease, I will obtain a PET scan to see if there are any other distant metastasis, and brain MRI -[facility] One showed EGFR L858R mutation(+), which indicating good response to EGFR inhibitor, I have started her on osimertinib on ** -she has slightly prolonged QTc, will monitor closely when she is on Osimertinib  -Lab reviewed today, she has mild leukopenia and thrombocytopenia, likely related to Tegretol.  Her EKG showed normal QTc -She is tolerating treatment well, will continue -PET scan 08/02/2022 showed

## 2022-08-02 NOTE — Progress Notes (Signed)
[place]   Telephone:(336) [phone] Fax:(336) [phone]   Clinic Follow up Note   Patient Care Team: [name], MD as PCP - General (Internal Medicine) [name], MD as Consulting Physician (Cardiothoracic Surgery) [name], MD as Consulting Physician (Urology) [name], MD as Consulting Physician (Hematology) [name], MD as Consulting Physician (Radiation Oncology) Hayden Pedro, PA-C as Physician Assistant (Radiation Oncology) Imaging, [facility] as Consulting Physician (Diagnostic Radiology)  Date of Service:  08/02/2022  CHIEF COMPLAINT: f/u of lung cancer   CURRENT THERAPY:  Osimertinib Started 06/23/2022   ASSESSMENT:  Melissa Snyder is a 89 y.o. female with   Primary cancer of left lower lobe of lung ([name]) Stage I left lung adenocarcinoma, pT1aN0M0 in 2016, LLL recurrence in 06/2017, LUL recurrence in 10/2020, malignant left pleural effusion in 04/2022 -initially diagnosed in 11/2014, s/p LLL segmentectomy by Dr. Roxan Hockey, pathology showing stage I adenocarcinoma -She had cancer recurrence in LLL in 06/2017 and in LUL in 10/2020. She was treated with radiation in 07/2017 and in 11/2020 with Dr. Lisbeth Renshaw.  -surveillance chest CT on 04/06/22 showed stable lung change, no evidence of recurrence. Also noted was a small left pleural effusion, increased compared to prior.  -She underwent left thoracentesis on 05/02/22 and unfortunately cytology was positive for adenocarcinoma, TTF-1 positive, consistent with recurrent lung adenocarcinoma. -I discussed this is unfortunately not curable disease, I will obtain a PET scan to see if there are any other distant metastasis, and brain MRI -[facility] showed EGFR L858R mutation(+), which indicating good response to EGFR inhibitor, I have started her on osimertinib in Jan 2024 -she has slightly prolonged QTc, will monitor closely when she is on Osimertinib  -Lab  reviewed today, she has mild leukopenia and thrombocytopenia, likely related to Tegretol.  Her EKG showed normal QTc -She is tolerating treatment well, will continue -PET scan 08/02/2022 showed mildly hypermetabolic right upper lung nodule, morphologically benign appearing mediastinal and hilar lymphadenopathy with metabolic activities, no other definitive metastasis.  The left pleura is not hypermetabolic on the PET scan. -She is tolerating Tagrisso well, lab reviewed, she has mild thrombocytopenia secondary to Tegretol, will continue. -Repeat EKG showed stable QTc, normal today.  -she was started on reduced dose of Tegretol 40 mg daily due to increased QTc at the baseline, since she is tolerable well, will increase to 80 mg daily and monitor closely.   PLAN: -Lab, PET scan and EKG reviewed with patient -Continue Tagrisso, increase to 80 mg daily, start on Monday 3/4. I called in a new prescription to [place] and follow-up in 3 weeks with EKG    SUMMARY OF ONCOLOGIC HISTORY: Oncology History Overview Note   Cancer Staging  Breast cancer of upper-outer quadrant of right female breast [facility]) Staging form: Breast, AJCC 7th Edition - Clinical stage from 05/19/2014: Stage IA (T1b, N0, M0) - Signed by Holley Bouche, NP on 11/17/2015 - Pathologic stage from 10/17/2014: Stage IA (T1b, N0, cM0) - Signed by [name], MD on 10/17/2014  Primary cancer of left lower lobe of lung ([name]) Staging form: Lung, AJCC 7th Edition - Clinical stage from 12/14/2014: Stage IA (T1a, N0, M0) - Unsigned    Breast cancer of upper-outer quadrant of right female breast ([name])  05/04/2014 Initial Diagnosis   Breast cancer   05/19/2014 Imaging   Diagnostic mammogram and ultrasound showed a 7 mm irregular suspicious mass located in the right breast at the 10:00 position, 7  cm from the nipple. Otherwise negative.   05/19/2014 Initial Biopsy   Right breast biopsy (UOQ): IDC, grade 2.     05/19/2014 Receptors her2   ER 100% positive, PR 35% positive, HER-2 negative, Ki67 8%   06/07/2014 Miscellaneous   Genetic testing normal.  APC, ATM, BARD1, BMPR1A, BRCA1, BRCA2, BRIP1, CDH1, CDK4, CDKN2A, CHEK2, EPCAM, GREM1, MLH1, MRE11A, MSH2, MSH6, MUTYH, NBN, NF1, PALB2, PMS2, POLD1, POLE, PTEN, RAD50, RAD51D, SMAD4, SMARCA4, STK11, and TP53 tested.    07/23/2014 Surgery   Right breast lumpectomy with SLNB Lucia Gaskins) showed invasive ductal carcinoma & DCIS; negative surgical margins.   07/23/2014 Pathology Results   Invasive ductal carcinoma, tumor size 0.8 cm, grade 1, no lymphovascular invasion, margins were negative, 2 sentinel lymph nodes were negative. (+) DCIS.   07/23/2014 Pathologic Stage   pT1b, pN0: Stage IA    09/16/2014 - 10/07/2014 Radiation Therapy   XRT completed Pablo Ledger). Right breast/ 42.72 Gy at 2.67 Gy per fraction x 21 fractions.     Anti-estrogen oral therapy   Aromasin prescribed in 01/2015 but Patient declines anti-estrogen therapy at this time d/t high co-pay of medication and possible side effects of AI therapy.    05/02/2015 Mammogram   Diagnostic TOMO bilat mammogram: No evidence of malignancy in either breast. Lumpectomy changes in right breast. Diagnostic mammo suggested in 1 year.    Primary cancer of left lower lobe of lung ([name])  10/25/2013 Imaging   CXR done for stroke protocol. No acute chest findings. Chronic lung disease with biapical scarring. Developing nodule in (L) apex cannot be excluded based on exam, athough may be d/t overlap of osseous structures.    10/27/2013 Imaging   CXR: Nodular opacity in mid (L) apex and appears more prominent than prior study. Area that appeared masslike in LUL near apex on recent CXR not seen at this time.  Underlying emphysema present. No edema or consolidation   11/12/2013 Imaging   CT chest: At left lung apex, there is asymmetric pleural parenchymal scarring. No discrete lesion.  In superior segment of LLL, there  are 1 dominant ground-glass opacity measuring 16 mm. 2 addt'l ground-glass opacities appreciated. F/U with CT in 3 months   02/11/2014 Imaging   CT chest: Several small bilat faint nodular densities, largest measures 1.3 cm over superior segment LLL. No new nodules, no adenopathy. Cannot exclude metastatic disease given pt's h/o melanoma. Recommend PET-CT   02/23/2014 PET scan   Persistent semi-solid nodule in superior segment LLL with low metabolic activity. Moderately metabolic subcarinal & hilar LN are likely reactive.    05/25/2014 Imaging   CT chest: Persistent & similar size dominant LLL sub-solid nodule. Adenocarcinoma is considered. Biopsy or resection strongly considered. Additional scattered small ground-glass nodules stable.    06/02/2014 Imaging   MRI brain: Grossly stable appearance of left planum sphenoidale meningioma measuring 20 x 14 mm. Meningioma does not extend to left orbital apex & contacts left optic nerve. Stable right posterior clinoid meningioma .    09/27/2014 Imaging   CT chest: Further enlargment of central solid component in sub-solid LLL pulm nodule. Findings remain concerning for adenoca. Additional scattered small bilat ground-glass nodules and biapical scarring stable. No adenopathy.     11/25/2014 Surgery   Video bronchoscopy with endobronchial ultrasound; Video assisted thoracoscopy; Thoracoscopic LLL superior segmentectomy; Mediastinal lymph node dissection; Cyro intercostal nerve block Roxan Hockey)   11/25/2014 Pathology Results   Superior segment lung resection (LLL). Adenocarcinoma, well-diff, spanning 1.5 cm. Surgical margins negative. 5 LNs  negative.    11/25/2014 Pathologic Stage   pT1a, pN0: Stage IA    11/25/2014 Initial Diagnosis   Primary cancer of left lower lobe of lung ([place])   05/31/2015 Imaging   CT chest: No evidence of local recurrent at superior segmentectomy in LLL. New small nodular focus of consolidation surrounding thin parenchymal  band. Additional sub-cm ground-glass and solid nodules favored to be benign. No thoracic adenopathy   08/11/2015 Imaging   CT chest: No evidence of residual or recurrent tumor in LLL. Stable small nodular focus in LLL with thin surrounding parenchymal band, favored to be benign.  Multiple small ground-glass nodules unchanged.    06/19/2017 Pathology Results   Diagnosis 1. Lung, biopsy, Left lower lobe - ADENOCARCINOMA. - SEE COMMENT. 2. Lung, biopsy, Left lower lobe - BENIGN LUNG PARENCHYMA. - THERE IS NO EVIDENCE OF MALIGNANCY.   07/09/2017 - 07/22/2017 Radiation Therapy   Radiation treatment dates: 07/09/17-07/22/17 by Dr. Lisbeth Renshaw    Site/dose:  Left lung/ 50 Gy in 10 fractions   Beams/energy:  IMRT/ 6X   Narrative: The patient tolerated radiation treatment relatively well. She denied pain, fatigue, or difficulty swallowing. Skin to the treatment area appears warm, dry, and normal in color.   06/18/2019 Imaging   CT Chest  IMPRESSION: 1. There are 2 round solid nodules within the left lower lobe which have increased in size when compared with the previous exam, suspicious. Additionally, along the area of postsurgical change around the oblique fissure of the left lung there is progressive thickening with persistent loculated fluid. Cannot rule out local tumor recurrence along the suture line. 2. No significant change in the appearance of bilateral upper lobe sub solid lung nodules. 3. Aortic Atherosclerosis (ICD10-I70.0). Coronary artery calcifications.     07/07/2019 PET scan   IMPRESSION: 1. No evidence of hypermetabolic local tumor recurrence in the left lung. Low level nonfocal uptake along the segmentectomy site in the left lower lobe favors post treatment change. Continued chest CT surveillance warranted. 2. Two small solid left lower lobe pulmonary nodules, largest 0.7 cm, below PET resolution. Given the growth of these nodules since 10/03/2018 chest CT, metastatic disease  remains on the differential. Close chest CT surveillance recommended. 3. Nonspecific hypermetabolism within nonenlarged subcarinal and bilateral hilar lymph nodes, not substantially changed since 03/04/2017 PET-CT study, favoring reactive uptake. 4. No hypermetabolic distant metastatic disease. 5. Chronic findings include: Aortic Atherosclerosis (ICD10-I70.0). Subcentimeter ground-glass pulmonary nodules in the upper lobes are unchanged and are below PET resolution. Moderate sigmoid diverticulosis. Chronic right maxillary sinusitis.   04/13/2020 Imaging   CT Chest  IMPRESSION: 1. Primarily similar appearance of the lungs compared to 10/14/2019. Left sided surgical sutures with similar solid and subsolid pulmonary nodules as detailed above. A right middle lobe pleural based 5 mm nodule is new since the prior exam, warranting follow-up attention. 2. No thoracic adenopathy. 3. Aortic atherosclerosis (ICD10-I70.0), coronary artery atherosclerosis and emphysema (ICD10-J43.9).   10/10/2020 Imaging   CT chest  IMPRESSION: Postsurgical and post radiation changes in the lungs bilaterally, as above.   12 x 10 mm left lower lobe nodule, mildly progressive. Additional bilobed nodule in the posterior left lower lobe, also favored to be progressive. PET-CT is suggested for further evaluation.   Aortic Atherosclerosis (ICD10-I70.0) and Emphysema (ICD10-J43.9).   11/22/2020 - 12/02/2020 Radiation Therapy   Site Technique Total Dose (Gy) Dose per Fx (Gy) Completed Fx Beam Energies  Lung, Left: Lung_Lt_LLL IMRT 60/60 12 5/5 6XFFF     05/22/2021 Imaging  EXAM: CT CHEST WITHOUT CONTRAST  IMPRESSION: 1. Interval development of a new area of volume loss, ground-glass opacity, and bronchiectasis in the posterior left lower lobe. Imaging features are compatible with radiation therapy. 2. The posterior left lung nodules, including index 12 x 10 mm nodule described previously have become  incorporated into the post treatment change on the current study. 3. Interval resolution of the posterior right middle lobe nodularity seen previously. 4. Other scattered areas of consolidative opacity or similar. 5. Trace left pleural effusion. 6. Aortic Atherosclerosis (ICD10-I70.0).      INTERVAL HISTORY:  LOUREN SYLVIA is here for a follow up of metastatic lung cancer. She was last seen by me on 07/05/2022. She presents to the clinic alone.  She is clinically stable, mild dyspnea on exertion is unchanged.  No cough or chest pain.  She has had a few episodes of diarrhea in the past months, which resolved spontaneously.  No nausea, vomiting, or other GI symptoms.  Her weight is stable.   All other systems were reviewed with the patient and are negative.  MEDICAL HISTORY:  Past Medical History:  Diagnosis Date   Adenocarcinoma ([place])    recurrent/notes 07/03/2017   Blind left eye 10/2013   cva   Breast cancer ([place]) 05/2014   ER+/PR+/Her2-     LUNG CANCER   Bundle branch block left    Chest pain    Family history of bladder cancer    Family history of breast cancer    Family history of prostate cancer    GERD (gastroesophageal reflux disease)    History of blood transfusion    age 69   Hyperlipidemia    Hypertension    Hypothyroid    Lung nodule    Melanoma in situ of face ([place]) JUNE 2015   LEFT CHEEK   Meningioma ([place])    brain lining   Migraine    reports only having one   Osteoarthritis    Personal history of radiation therapy    Radiation 09/16/14-10/07/14   Right  Breast  21 fractions   Skin cancer    Stroke ([place]) 2015   Blind in left eye as a result    SURGICAL HISTORY: Past Surgical History:  Procedure Laterality Date   ABDOMINAL HYSTERECTOMY  ~ 1997   APPENDECTOMY     age 68   BILATERAL SALPINGOOPHORECTOMY     BLADDER REPAIR  2009   cystocele   BRAIN MENINGIOMA EXCISION     Gamma knife to Meningioma in brain lining   BREAST BIOPSY     BREAST LUMPECTOMY  Right 07/23/2014   invasive ductal   BUNIONECTOMY     bilateral great toe   CARDIAC CATHETERIZATION  2004   CHOLECYSTECTOMY  ~ [place] BLOCK Left 11/25/2014   Procedure: CRYO INTERCOSTAL NERVE BLOCK;  Surgeon: [name], MD;  Location: [facility];  Service: Thoracic;  Laterality: Left;   CT RADIATION THERAPY GUIDE     Gamma radiation -lt frontal-[facility]   DILATION AND CURETTAGE OF UTERUS     X 2   HIP ARTHROPLASTY Left 07/04/2017   Procedure: ARTHROPLASTY BIPOLAR HIP (HEMIARTHROPLASTY);  Surgeon: Tania Ade, MD;  Location: [facility];  Service: Orthopedics;  Laterality: Left;   MELANOMA EXCISION Left 11/05/23, 11/30/13   CHEEK   SEGMENTECOMY Left 11/25/2014   Procedure: LEFT LOWER LOBE SEGMENTECTOMY;  Surgeon: [name], MD;  Location: [facility];  Service: Thoracic;  Laterality: Left;   VIDEO ASSISTED THORACOSCOPY Left 11/25/2014   Procedure: VIDEO ASSISTED THORACOSCOPY;  Surgeon: [name], MD;  Location: [facility];  Service: Thoracic;  Laterality: Left;   VIDEO BRONCHOSCOPY WITH ENDOBRONCHIAL NAVIGATION N/A 06/19/2017   Procedure: VIDEO BRONCHOSCOPY;  Surgeon: [name], MD;  Location: [facility];  Service: Thoracic;  Laterality: N/A;   VIDEO BRONCHOSCOPY WITH ENDOBRONCHIAL ULTRASOUND N/A 11/25/2014   Procedure: VIDEO BRONCHOSCOPY WITH ENDOBRONCHIAL ULTRASOUND;  Surgeon: [name], MD;  Location: [facility];  Service: Thoracic;  Laterality: N/A;    I have reviewed the social history and family history with the patient and they are unchanged from previous note.  ALLERGIES:  is allergic to beef-derived products, other, chocolate, ibuprofen, niacin and related, and tramadol.  MEDICATIONS:  Current Outpatient Medications  Medication Sig Dispense Refill   aspirin 81 MG EC tablet      atenolol (TENORMIN) 50 MG tablet Take 50 mg by mouth at bedtime.      b complex vitamins capsule Take 1 capsule by mouth daily.      BINAXNOW COVID-19 AG HOME TEST KIT See admin instructions.     calcium carbonate (OSCAL) 1500 (600 Ca) MG TABS tablet      calcium-vitamin D (OSCAL WITH D) 500-200 MG-UNIT per tablet Take 1 tablet by mouth at bedtime.      Cholecalciferol (VITAMIN D3) 1000 units CAPS Take by mouth daily.     denosumab (PROLIA) 60 MG/ML SOSY injection      Digestive Enzymes (ENZYME DIGEST PO) Take 5 mLs by mouth 3 (three) times daily with meals.     FLUZONE HIGH-DOSE QUADRIVALENT 0.7 ML SUSY      levothyroxine (SYNTHROID, LEVOTHROID) 112 MCG tablet Take 112 mcg by mouth daily before breakfast.      lisinopril (PRINIVIL,ZESTRIL) 10 MG tablet Take 1 tablet (10 mg total) by mouth daily. (Patient taking differently: Take 10 mg by mouth 2 (two) times daily.) 30 tablet 1   meclizine (ANTIVERT) 12.5 MG tablet Take 12.5 mg by mouth 3 (three) times daily as needed for dizziness.     Multiple Vitamin (MULTIVITAMIN) tablet Take 1 tablet by mouth every morning.      Omega-3 1000 MG CAPS Take 1 g by mouth daily.     osimertinib mesylate (TAGRISSO) 40 MG tablet Take 1 tablet (40 mg total) by mouth daily. 30 tablet 0   Polyvinyl Alcohol-Povidone (REFRESH OP) Apply 1 drop to eye as needed (DRY EYES).      Triamcinolone Acetonide (NASACORT AQ NA) Place 1 spray into the nose at bedtime as needed (CONGESTION).     vitamin E 180 MG (400 UNITS) capsule Take 400 Units by mouth every Monday, Wednesday, and Friday. Mon Wed Fri     No current facility-administered medications for this visit.    PHYSICAL EXAMINATION: ECOG PERFORMANCE STATUS: 1 - Symptomatic but completely ambulatory  Vitals:   08/02/22 1249  BP: (!) 183/75  Pulse: 68  Resp: 18  Temp: 98.2 F (36.8 C)  SpO2: 99%   Wt Readings from Last 3 Encounters:  08/02/22 115 lb 4.8 oz (52.3 kg)  07/05/22 114 lb 4.8 oz (51.8 kg)  06/20/22 117 lb 11.2 oz (53.4 kg)     GENERAL:alert, no distress and comfortable SKIN: skin color, texture, turgor are normal, no rashes or  significant lesions EYES: normal, Conjunctiva are pink and non-injected, sclera clear NECK: supple, thyroid normal size, non-tender, without nodularity LYMPH:  no palpable lymphadenopathy in the cervical,  axillary  LUNGS: clear to auscultation and percussion with normal breathing effort HEART: regular rate & rhythm and no murmurs and no lower extremity edema ABDOMEN:abdomen soft, non-tender and normal bowel sounds Musculoskeletal:no cyanosis of digits and no clubbing  NEURO: alert & oriented x 3 with fluent speech, no focal motor/sensory deficits  LABORATORY DATA:  I have reviewed the data as listed    Latest Ref Rng & Units 08/02/2022   12:15 PM 07/05/2022   11:38 AM 06/13/2022    1:38 PM  CBC  WBC 4.0 - 10.5 K/uL 4.5  3.6  4.6   Hemoglobin 12.0 - 15.0 g/dL 12.2  12.4  12.8   Hematocrit 36.0 - 46.0 % 37.1  36.7  38.6   Platelets 150 - 400 K/uL 96  91  160         Latest Ref Rng & Units 08/02/2022   12:15 PM 07/05/2022   11:38 AM 06/13/2022    1:38 PM  CMP  Glucose 70 - 99 mg/dL 95  87  94   BUN 8 - 23 mg/dL '19  18  18   '$ Creatinine 0.44 - 1.00 mg/dL 0.63  0.71  0.61   Sodium 135 - 145 mmol/L 140  141  139   Potassium 3.5 - 5.1 mmol/L 4.7  4.1  4.0   Chloride 98 - 111 mmol/L 104  104  104   CO2 22 - 32 mmol/L 31  32  32   Calcium 8.9 - 10.3 mg/dL 8.7  9.2  9.3   Total Protein 6.5 - 8.1 g/dL 6.5  6.9  7.0   Total Bilirubin 0.3 - 1.2 mg/dL 0.5  0.6  0.6   Alkaline Phos 38 - 126 U/L 81  85  95   AST 15 - 41 U/L '25  25  21   '$ ALT 0 - 44 U/L '23  26  18       '$ RADIOGRAPHIC STUDIES: I have personally reviewed the radiological images as listed and agreed with the findings in the report. No results found.    No orders of the defined types were placed in this encounter.  All questions were answered. The patient knows to call the clinic with any problems, questions or concerns. No barriers to learning was detected. The total time spent in the appointment was 30 minutes.     [name], MD 08/02/2022

## 2022-08-03 ENCOUNTER — Other Ambulatory Visit: Payer: Self-pay

## 2022-08-03 ENCOUNTER — Other Ambulatory Visit (HOSPITAL_COMMUNITY): Payer: Self-pay

## 2022-08-06 ENCOUNTER — Other Ambulatory Visit: Payer: Self-pay

## 2022-08-06 ENCOUNTER — Other Ambulatory Visit (HOSPITAL_COMMUNITY): Payer: Self-pay

## 2022-08-10 ENCOUNTER — Other Ambulatory Visit (HOSPITAL_COMMUNITY): Payer: Self-pay

## 2022-08-10 ENCOUNTER — Telehealth: Payer: Self-pay

## 2022-08-10 MED ORDER — NYSTATIN 100000 UNIT/ML MT SUSP
5.0000 mL | Freq: Three times a day (TID) | OROMUCOSAL | 1 refills | Status: DC | PRN
  Filled 2022-08-10: qty 140, 10d supply, fill #0

## 2022-08-10 NOTE — Telephone Encounter (Signed)
Pt called stating since she increased her Tagrisso she's been experiencing increased diarrhea and has developed redness & soreness w/in her mouth.  Pt stated she's having 1 episode per day of diarrhea.  Pt stated she took 1/2 pill of Imodium which resolved pt's diarrhea.  Pt denied n/v.  Pt stated she's been hydrating well.  Pt stated she's been rinsing her mouth with a dental mouthwash for the soreness and redness but it's not working.  Verbal order for Magic Mouthwash given and prescribed.  Informed pt on how to take the Magic Mouthwash.  Pt verbalized understanding.  Instructed pt to contact Dr. Ernestina Penna office if the redness does not resolve.

## 2022-08-13 ENCOUNTER — Other Ambulatory Visit: Payer: Self-pay

## 2022-08-14 ENCOUNTER — Telehealth: Payer: Self-pay

## 2022-08-14 NOTE — Telephone Encounter (Signed)
Pt returned call and LVM stating that she picked up the magic mouthwash and it's helping her mouth tremendously.  Pt stated she's still having some loose stools but not a lot of them.  Pt stated she's taking 1/2 pill of Imodium which is also helping.  Pt stated she's feeling much better.

## 2022-08-14 NOTE — Telephone Encounter (Signed)
LVM for pt to call Dr. Ernestina Penna office.  Following up on previous call from last week from pt regarding diarrhea and mouth pain.  Awaiting pt's return call.

## 2022-08-27 ENCOUNTER — Other Ambulatory Visit (HOSPITAL_COMMUNITY): Payer: Self-pay

## 2022-09-05 ENCOUNTER — Other Ambulatory Visit: Payer: Self-pay

## 2022-09-05 ENCOUNTER — Telehealth: Payer: Self-pay

## 2022-09-05 ENCOUNTER — Inpatient Hospital Stay: Payer: Medicare HMO | Attending: Hematology

## 2022-09-05 DIAGNOSIS — C50411 Malignant neoplasm of upper-outer quadrant of right female breast: Secondary | ICD-10-CM

## 2022-09-05 DIAGNOSIS — C3432 Malignant neoplasm of lower lobe, left bronchus or lung: Secondary | ICD-10-CM

## 2022-09-05 DIAGNOSIS — J910 Malignant pleural effusion: Secondary | ICD-10-CM | POA: Diagnosis not present

## 2022-09-05 DIAGNOSIS — Z79899 Other long term (current) drug therapy: Secondary | ICD-10-CM | POA: Diagnosis not present

## 2022-09-05 DIAGNOSIS — C3412 Malignant neoplasm of upper lobe, left bronchus or lung: Secondary | ICD-10-CM | POA: Insufficient documentation

## 2022-09-05 LAB — CBC WITH DIFFERENTIAL (CANCER CENTER ONLY)
Abs Immature Granulocytes: 0 10*3/uL (ref 0.00–0.07)
Basophils Absolute: 0 10*3/uL (ref 0.0–0.1)
Basophils Relative: 1 %
Eosinophils Absolute: 0.1 10*3/uL (ref 0.0–0.5)
Eosinophils Relative: 4 %
HCT: 37.2 % (ref 36.0–46.0)
Hemoglobin: 12.5 g/dL (ref 12.0–15.0)
Immature Granulocytes: 0 %
Lymphocytes Relative: 19 %
Lymphs Abs: 0.7 10*3/uL (ref 0.7–4.0)
MCH: 29.8 pg (ref 26.0–34.0)
MCHC: 33.6 g/dL (ref 30.0–36.0)
MCV: 88.6 fL (ref 80.0–100.0)
Monocytes Absolute: 0.4 10*3/uL (ref 0.1–1.0)
Monocytes Relative: 11 %
Neutro Abs: 2.3 10*3/uL (ref 1.7–7.7)
Neutrophils Relative %: 65 %
Platelet Count: 86 10*3/uL — ABNORMAL LOW (ref 150–400)
RBC: 4.2 MIL/uL (ref 3.87–5.11)
RDW: 13.9 % (ref 11.5–15.5)
WBC Count: 3.5 10*3/uL — ABNORMAL LOW (ref 4.0–10.5)
nRBC: 0 % (ref 0.0–0.2)

## 2022-09-05 LAB — CMP (CANCER CENTER ONLY)
ALT: 21 U/L (ref 0–44)
AST: 25 U/L (ref 15–41)
Albumin: 4.2 g/dL (ref 3.5–5.0)
Alkaline Phosphatase: 68 U/L (ref 38–126)
Anion gap: 5 (ref 5–15)
BUN: 19 mg/dL (ref 8–23)
CO2: 29 mmol/L (ref 22–32)
Calcium: 9.3 mg/dL (ref 8.9–10.3)
Chloride: 104 mmol/L (ref 98–111)
Creatinine: 0.76 mg/dL (ref 0.44–1.00)
GFR, Estimated: >60 mL/min (ref >60)
Glucose, Bld: 93 mg/dL (ref 70–99)
Potassium: 4 mmol/L (ref 3.5–5.1)
Sodium: 138 mmol/L (ref 135–145)
Total Bilirubin: 0.7 mg/dL (ref 0.3–1.2)
Total Protein: 7.3 g/dL (ref 6.5–8.1)

## 2022-09-05 NOTE — Telephone Encounter (Signed)
Pt called stating that she's severely fatigue since the dose of the Tagrisso was increased.  Pt stated she has a f/u appt with Dr. Burr Medico next week but wanted to let Dr. Burr Medico know of her fatigue.  Asked pt if she is able to come in today for labs.  Pt stated "Yes".  Scheduled pt for lab appt at 9am to check pt's labs.  Notified Dr. Burr Medico of pt's call.

## 2022-09-06 ENCOUNTER — Telehealth: Payer: Self-pay | Admitting: Pharmacist

## 2022-09-06 NOTE — Telephone Encounter (Signed)
Oral Chemotherapy Pharmacist Encounter   Notified by Dr. Burr Medico that patient will be dose reducing to Tagrisso 40 mg daily going forward. I called and spoke with Melissa Snyder and discussed that it is OK to cut the Tagrisso 80 mg tablets in half for the remaining amount she has on hand.  Patient confirmed she has a pill cutter at home and is comfortable with cutting pill in half. She currently has #13 of the 80 mg tablets on hand at home. Will have pharmacy reach out to patient closer to time when she is due for refill and have Tagrisso 40 mg Rx sent in at that time.   Melissa Snyder knows to call the office with any questions or concerns.   Leron Croak, PharmD, BCPS, [facility] Hematology/Oncology Clinical Pharmacist [facility] [place] [phone] 09/06/2022 8:59 AM

## 2022-09-09 NOTE — Assessment & Plan Note (Signed)
Stage I left lung adenocarcinoma, pT1aN0M0 in 2016, LLL recurrence in 06/2017, LUL recurrence in 10/2020, malignant left pleural effusion in 04/2022 -initially diagnosed in 11/2014, s/p LLL segmentectomy by Dr. Dorris Fetch, pathology showing stage I adenocarcinoma -She had cancer recurrence in LLL in 06/2017 and in LUL in 10/2020. She was treated with radiation in 07/2017 and in 11/2020 with Dr. Mitzi Hansen.  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  The left pleura is not hypermetabolic on the PET scan. -She previously tolerated Tagrisso 40 mg daily very well, I increased the dose to 80 mg daily on last visit, she did not tolerated well, and have reduced dose back to 40 mg daily.

## 2022-09-09 NOTE — Assessment & Plan Note (Signed)
pT1b N0 M0 stage IA, ER positive PR positive and HER-2 negative. -diagnosed in 05/2014, s/p right lumpectomy and adjuvant radiation. She declined antiestrogens due to cost and concern with side effects.  -most recent mammogram 09/12/21 was negative. -She is clinically stable from a breast standpoint. Physical exam was unremarkable. There is no clinical concern for recurrence. -Continue Surveillance.  

## 2022-09-10 ENCOUNTER — Inpatient Hospital Stay: Payer: Medicare HMO

## 2022-09-10 ENCOUNTER — Inpatient Hospital Stay (HOSPITAL_BASED_OUTPATIENT_CLINIC_OR_DEPARTMENT_OTHER): Payer: Medicare HMO | Admitting: Hematology

## 2022-09-10 ENCOUNTER — Other Ambulatory Visit: Payer: Self-pay

## 2022-09-10 ENCOUNTER — Encounter: Payer: Self-pay | Admitting: Hematology

## 2022-09-10 VITALS — BP 163/70 | HR 65 | Temp 97.7°F | Resp 18 | Ht 65.0 in | Wt 112.3 lb

## 2022-09-10 DIAGNOSIS — C50411 Malignant neoplasm of upper-outer quadrant of right female breast: Secondary | ICD-10-CM

## 2022-09-10 DIAGNOSIS — Z79899 Other long term (current) drug therapy: Secondary | ICD-10-CM | POA: Diagnosis not present

## 2022-09-10 DIAGNOSIS — C3412 Malignant neoplasm of upper lobe, left bronchus or lung: Secondary | ICD-10-CM | POA: Diagnosis not present

## 2022-09-10 DIAGNOSIS — C3432 Malignant neoplasm of lower lobe, left bronchus or lung: Secondary | ICD-10-CM

## 2022-09-10 DIAGNOSIS — Z170 Estrogen receptor positive status [ER+]: Secondary | ICD-10-CM

## 2022-09-10 DIAGNOSIS — J910 Malignant pleural effusion: Secondary | ICD-10-CM | POA: Diagnosis not present

## 2022-09-10 LAB — CMP (CANCER CENTER ONLY)
ALT: 21 U/L (ref 0–44)
AST: 26 U/L (ref 15–41)
Albumin: 4.3 g/dL (ref 3.5–5.0)
Alkaline Phosphatase: 63 U/L (ref 38–126)
Anion gap: 6 (ref 5–15)
BUN: 18 mg/dL (ref 8–23)
CO2: 28 mmol/L (ref 22–32)
Calcium: 9.5 mg/dL (ref 8.9–10.3)
Chloride: 103 mmol/L (ref 98–111)
Creatinine: 0.79 mg/dL (ref 0.44–1.00)
GFR, Estimated: >60 mL/min (ref >60)
Glucose, Bld: 93 mg/dL (ref 70–99)
Potassium: 4.2 mmol/L (ref 3.5–5.1)
Sodium: 137 mmol/L (ref 135–145)
Total Bilirubin: 0.6 mg/dL (ref 0.3–1.2)
Total Protein: 7.2 g/dL (ref 6.5–8.1)

## 2022-09-10 LAB — CBC WITH DIFFERENTIAL (CANCER CENTER ONLY)
Abs Immature Granulocytes: 0.01 10*3/uL (ref 0.00–0.07)
Basophils Absolute: 0 10*3/uL (ref 0.0–0.1)
Basophils Relative: 1 %
Eosinophils Absolute: 0.1 10*3/uL (ref 0.0–0.5)
Eosinophils Relative: 3 %
HCT: 38.2 % (ref 36.0–46.0)
Hemoglobin: 12.6 g/dL (ref 12.0–15.0)
Immature Granulocytes: 0 %
Lymphocytes Relative: 17 %
Lymphs Abs: 0.7 10*3/uL (ref 0.7–4.0)
MCH: 29.4 pg (ref 26.0–34.0)
MCHC: 33 g/dL (ref 30.0–36.0)
MCV: 89 fL (ref 80.0–100.0)
Monocytes Absolute: 0.5 10*3/uL (ref 0.1–1.0)
Monocytes Relative: 12 %
Neutro Abs: 2.9 10*3/uL (ref 1.7–7.7)
Neutrophils Relative %: 67 %
Platelet Count: 94 10*3/uL — ABNORMAL LOW (ref 150–400)
RBC: 4.29 MIL/uL (ref 3.87–5.11)
RDW: 14 % (ref 11.5–15.5)
WBC Count: 4.3 10*3/uL (ref 4.0–10.5)
nRBC: 0 % (ref 0.0–0.2)

## 2022-09-10 NOTE — Progress Notes (Signed)
[facility]   Telephone:([phone] [phone]   Clinic Follow up Note   Patient Care Team: Rodrigo Ran, MD as PCP - General (Internal Medicine) Loreli Slot, MD as Consulting Physician (Cardiothoracic Surgery) Alfredo Martinez, MD as Consulting Physician (Urology) Malachy Mood, MD as Consulting Physician (Hematology) Lurline Hare, MD as Consulting Physician (Radiation Oncology) Ronny Bacon, PA-C as Physician Assistant (Radiation Oncology) Imaging, [facility] as Consulting Physician (Diagnostic Radiology)  Date of Service:  09/10/2022  CHIEF COMPLAINT: f/u of lung cancer   CURRENT THERAPY:  Osimertinib Started 06/23/2022    ASSESSMENT:  Melissa Snyder is a 89 y.o. female with   Breast cancer of upper-outer quadrant of right female breast (HCC) pT1b N0 M0 stage IA, ER positive PR positive and HER-2 negative. -diagnosed in 05/2014, s/p right lumpectomy and adjuvant radiation. She declined antiestrogens due to cost and concern with side effects.  -most recent mammogram 09/12/21 was negative. -She is clinically stable from a breast standpoint. Physical exam was unremarkable. There is no clinical concern for recurrence. -Continue Surveillance.   Primary cancer of left lower lobe of lung (HCC) Stage I left lung adenocarcinoma, pT1aN0M0 in 2016, LLL recurrence in 06/2017, LUL recurrence in 10/2020, malignant left pleural effusion in 04/2022 -initially diagnosed in 11/2014, s/p LLL segmentectomy by Dr. Dorris Fetch, pathology showing stage I adenocarcinoma -She had cancer recurrence in LLL in 06/2017 and in LUL in 10/2020. She was treated with radiation in 07/2017 and in 11/2020 with Dr. Mitzi Hansen.  -She developed cancer recurrence with malignant left pleural effusion in November 2023 -[facility] showed EGFR L858R mutation(+), which indicating good response to EGFR inhibitor, I have started her on osimertinib in Jan 2024 -she has  slightly prolonged QTc, will monitor closely when she is on Osimertinib, repeated EKG showed normal QTc --PET scan 08/02/2022 showed mildly hypermetabolic right upper lung nodule, morphologically benign appearing mediastinal and hilar lymphadenopathy with metabolic activities, no other definitive metastasis.  The left pleura is not hypermetabolic on the PET scan. -She previously tolerated Tagrisso 40 mg daily very well, I increased the dose to 80 mg daily on last visit, she did not tolerated well, and have reduced dose back to 40 mg daily about 2 days ago. -She still has leg pain from high-dose Tagrisso, Ok to hold it for 3 to 5 days, and restart at 40 mg daily -She is scheduled to have a restaging CT chest in 3 to 4 weeks I will follow-up after that    Goal of care discussion  -We again discussed the incurable nature of her cancer, and the overall poor prognosis, especially if she does not have good response to therapy or progress on therapy -The patient understands the goal of care is palliative. -she does not have a living will. I recommend DNR/DNI, she will think about it    PLAN: - ct scan scheduled for May 2 - hold Tagrisso for a few days to see if she feels better, then restart at 40 mg daily - f/u on May 6 after CT scan - reviewed labs - discussed living will, will talk with family    SUMMARY OF ONCOLOGIC HISTORY: Oncology History Overview Note   Cancer Staging  Breast cancer of upper-outer quadrant of right female breast [facility]) Staging form: Breast, AJCC 7th Edition - Clinical stage from 05/19/2014: Stage IA (T1b, N0, M0) - Signed by Hubbard Hartshorn, NP on 11/17/2015 - Pathologic stage from 10/17/2014: Stage IA (T1b, N0,  cM0) - Signed by Lurline Hare, MD on 10/17/2014  Primary cancer of left lower lobe of lung [facility]) Staging form: Lung, AJCC 7th Edition - Clinical stage from 12/14/2014: Stage IA (T1a, N0, M0) - Unsigned    Breast cancer of upper-outer quadrant of right  female breast  05/04/2014 Initial Diagnosis   Breast cancer   05/19/2014 Imaging   Diagnostic mammogram and ultrasound showed a 7 mm irregular suspicious mass located in the right breast at the 10:00 position, 7 cm from the nipple. Otherwise negative.   05/19/2014 Initial Biopsy   Right breast biopsy (UOQ): IDC, grade 2.    05/19/2014 Receptors her2   ER 100% positive, PR 35% positive, HER-2 negative, Ki67 8%   06/07/2014 Miscellaneous   Genetic testing normal.  APC, ATM, BARD1, BMPR1A, BRCA1, BRCA2, BRIP1, CDH1, CDK4, CDKN2A, CHEK2, EPCAM, GREM1, MLH1, MRE11A, MSH2, MSH6, MUTYH, NBN, NF1, PALB2, PMS2, POLD1, POLE, PTEN, RAD50, RAD51D, SMAD4, SMARCA4, STK11, and TP53 tested.    07/23/2014 Surgery   Right breast lumpectomy with SLNB Ezzard Standing) showed invasive ductal carcinoma & DCIS; negative surgical margins.   07/23/2014 Pathology Results   Invasive ductal carcinoma, tumor size 0.8 cm, grade 1, no lymphovascular invasion, margins were negative, 2 sentinel lymph nodes were negative. (+) DCIS.   07/23/2014 Pathologic Stage   pT1b, pN0: Stage IA    09/16/2014 - 10/07/2014 Radiation Therapy   XRT completed Michell Heinrich). Right breast/ 42.72 Gy at 2.67 Gy per fraction x 21 fractions.     Anti-estrogen oral therapy   Aromasin prescribed in 01/2015 but Patient declines anti-estrogen therapy at this time d/t high co-pay of medication and possible side effects of AI therapy.    05/02/2015 Mammogram   Diagnostic TOMO bilat mammogram: No evidence of malignancy in either breast. Lumpectomy changes in right breast. Diagnostic mammo suggested in 1 year.    Primary cancer of left lower lobe of lung  10/25/2013 Imaging   CXR done for stroke protocol. No acute chest findings. Chronic lung disease with biapical scarring. Developing nodule in (L) apex cannot be excluded based on exam, athough may be d/t overlap of osseous structures.    10/27/2013 Imaging   CXR: Nodular opacity in mid (L) apex and appears more  prominent than prior study. Area that appeared masslike in LUL near apex on recent CXR not seen at this time.  Underlying emphysema present. No edema or consolidation   11/12/2013 Imaging   CT chest: At left lung apex, there is asymmetric pleural parenchymal scarring. No discrete lesion.  In superior segment of LLL, there are 1 dominant ground-glass opacity measuring 16 mm. 2 addt'l ground-glass opacities appreciated. F/U with CT in 3 months   02/11/2014 Imaging   CT chest: Several small bilat faint nodular densities, largest measures 1.3 cm over superior segment LLL. No new nodules, no adenopathy. Cannot exclude metastatic disease given pt's h/o melanoma. Recommend PET-CT   02/23/2014 PET scan   Persistent semi-solid nodule in superior segment LLL with low metabolic activity. Moderately metabolic subcarinal & hilar LN are likely reactive.    05/25/2014 Imaging   CT chest: Persistent & similar size dominant LLL sub-solid nodule. Adenocarcinoma is considered. Biopsy or resection strongly considered. Additional scattered small ground-glass nodules stable.    06/02/2014 Imaging   MRI brain: Grossly stable appearance of left planum sphenoidale meningioma measuring 20 x 14 mm. Meningioma does not extend to left orbital apex & contacts left optic nerve. Stable right posterior clinoid meningioma .    09/27/2014 Imaging  CT chest: Further enlargment of central solid component in sub-solid LLL pulm nodule. Findings remain concerning for adenoca. Additional scattered small bilat ground-glass nodules and biapical scarring stable. No adenopathy.     11/25/2014 Surgery   Video bronchoscopy with endobronchial ultrasound; Video assisted thoracoscopy; Thoracoscopic LLL superior segmentectomy; Mediastinal lymph node dissection; Cyro intercostal nerve block Dorris Fetch)   11/25/2014 Pathology Results   Superior segment lung resection (LLL). Adenocarcinoma, well-diff, spanning 1.5 cm. Surgical margins negative. 5  LNs negative.    11/25/2014 Pathologic Stage   pT1a, pN0: Stage IA    11/25/2014 Initial Diagnosis   Primary cancer of left lower lobe of lung (HCC)   05/31/2015 Imaging   CT chest: No evidence of local recurrent at superior segmentectomy in LLL. New small nodular focus of consolidation surrounding thin parenchymal band. Additional sub-cm ground-glass and solid nodules favored to be benign. No thoracic adenopathy   08/11/2015 Imaging   CT chest: No evidence of residual or recurrent tumor in LLL. Stable small nodular focus in LLL with thin surrounding parenchymal band, favored to be benign.  Multiple small ground-glass nodules unchanged.    06/19/2017 Pathology Results   Diagnosis 1. Lung, biopsy, Left lower lobe - ADENOCARCINOMA. - SEE COMMENT. 2. Lung, biopsy, Left lower lobe - BENIGN LUNG PARENCHYMA. - THERE IS NO EVIDENCE OF MALIGNANCY.   07/09/2017 - 07/22/2017 Radiation Therapy   Radiation treatment dates: 07/09/17-07/22/17 by Dr. Mitzi Hansen    Site/dose:  Left lung/ 50 Gy in 10 fractions   Beams/energy:  IMRT/ 6X   Narrative: The patient tolerated radiation treatment relatively well. She denied pain, fatigue, or difficulty swallowing. Skin to the treatment area appears warm, dry, and normal in color.   06/18/2019 Imaging   CT Chest  IMPRESSION: 1. There are 2 round solid nodules within the left lower lobe which have increased in size when compared with the previous exam, suspicious. Additionally, along the area of postsurgical change around the oblique fissure of the left lung there is progressive thickening with persistent loculated fluid. Cannot rule out local tumor recurrence along the suture line. 2. No significant change in the appearance of bilateral upper lobe sub solid lung nodules. 3. Aortic Atherosclerosis (ICD10-I70.0). Coronary artery calcifications.     07/07/2019 PET scan   IMPRESSION: 1. No evidence of hypermetabolic local tumor recurrence in the left lung. Low  level nonfocal uptake along the segmentectomy site in the left lower lobe favors post treatment change. Continued chest CT surveillance warranted. 2. Two small solid left lower lobe pulmonary nodules, largest 0.7 cm, below PET resolution. Given the growth of these nodules since 10/03/2018 chest CT, metastatic disease remains on the differential. Close chest CT surveillance recommended. 3. Nonspecific hypermetabolism within nonenlarged subcarinal and bilateral hilar lymph nodes, not substantially changed since 03/04/2017 PET-CT study, favoring reactive uptake. 4. No hypermetabolic distant metastatic disease. 5. Chronic findings include: Aortic Atherosclerosis (ICD10-I70.0). Subcentimeter ground-glass pulmonary nodules in the upper lobes are unchanged and are below PET resolution. Moderate sigmoid diverticulosis. Chronic right maxillary sinusitis.   04/13/2020 Imaging   CT Chest  IMPRESSION: 1. Primarily similar appearance of the lungs compared to 10/14/2019. Left sided surgical sutures with similar solid and subsolid pulmonary nodules as detailed above. A right middle lobe pleural based 5 mm nodule is new since the prior exam, warranting follow-up attention. 2. No thoracic adenopathy. 3. Aortic atherosclerosis (ICD10-I70.0), coronary artery atherosclerosis and emphysema (ICD10-J43.9).   10/10/2020 Imaging   CT chest  IMPRESSION: Postsurgical and post radiation changes in the  lungs bilaterally, as above.   12 x 10 mm left lower lobe nodule, mildly progressive. Additional bilobed nodule in the posterior left lower lobe, also favored to be progressive. PET-CT is suggested for further evaluation.   Aortic Atherosclerosis (ICD10-I70.0) and Emphysema (ICD10-J43.9).   11/22/2020 - 12/02/2020 Radiation Therapy   Site Technique Total Dose (Gy) Dose per Fx (Gy) Completed Fx Beam Energies  Lung, Left: Lung_Lt_LLL IMRT 60/60 12 5/5 6XFFF     05/22/2021 Imaging   EXAM: CT CHEST WITHOUT  CONTRAST  IMPRESSION: 1. Interval development of a new area of volume loss, ground-glass opacity, and bronchiectasis in the posterior left lower lobe. Imaging features are compatible with radiation therapy. 2. The posterior left lung nodules, including index 12 x 10 mm nodule described previously have become incorporated into the post treatment change on the current study. 3. Interval resolution of the posterior right middle lobe nodularity seen previously. 4. Other scattered areas of consolidative opacity or similar. 5. Trace left pleural effusion. 6. Aortic Atherosclerosis (ICD10-I70.0).      INTERVAL HISTORY:  ADONAI HELZER is here for a follow up of lung cancer. Patient was last seen by me on 08/02/2022. Patient presents to clinic today alone. Patient reports having muscle pain in legs, she reduced the dose to 80 mg. She is having some breathing difficulty. Patient reports having a head pain that went away.      All other systems were reviewed with the patient and are negative.  MEDICAL HISTORY:  Past Medical History:  Diagnosis Date   Adenocarcinoma    recurrent/notes 07/03/2017   Blind left eye 10/2013   cva   Breast cancer 05/2014   ER+/PR+/Her2-     LUNG CANCER   Bundle branch block left    Chest pain    Family history of bladder cancer    Family history of breast cancer    Family history of prostate cancer    GERD (gastroesophageal reflux disease)    History of blood transfusion    age 29   Hyperlipidemia    Hypertension    Hypothyroid    Lung nodule    Melanoma in situ of face JUNE 2015   LEFT CHEEK   Meningioma    brain lining   Migraine    reports only having one   Osteoarthritis    Personal history of radiation therapy    Radiation 09/16/14-10/07/14   Right  Breast  21 fractions   Skin cancer    Stroke 2015   Blind in left eye as a result    SURGICAL HISTORY: Past Surgical History:  Procedure Laterality Date   ABDOMINAL HYSTERECTOMY  ~ 1997    APPENDECTOMY     age 80   BILATERAL SALPINGOOPHORECTOMY     BLADDER REPAIR  2009   cystocele   BRAIN MENINGIOMA EXCISION     Gamma knife to Meningioma in brain lining   BREAST BIOPSY     BREAST LUMPECTOMY Right 07/23/2014   invasive ductal   BUNIONECTOMY     bilateral great toe   CARDIAC CATHETERIZATION  2004   CHOLECYSTECTOMY  ~ 1997   COLONOSCOPY     CRYO INTERCOSTAL NERVE BLOCK Left 11/25/2014   Procedure: CRYO INTERCOSTAL NERVE BLOCK;  Surgeon: Loreli Slot, MD;  Location: [facility] OR;  Service: Thoracic;  Laterality: Left;   CT RADIATION THERAPY GUIDE     Gamma radiation -lt frontal-[facility]   DILATION AND CURETTAGE OF UTERUS     X 2  HIP ARTHROPLASTY Left 07/04/2017   Procedure: ARTHROPLASTY BIPOLAR HIP (HEMIARTHROPLASTY);  Surgeon: Jones Broom, MD;  Location: [facility] OR;  Service: Orthopedics;  Laterality: Left;   MELANOMA EXCISION Left 11/05/23, 11/30/13   CHEEK   SEGMENTECOMY Left 11/25/2014   Procedure: LEFT LOWER LOBE SEGMENTECTOMY;  Surgeon: Loreli Slot, MD;  Location: [facility] OR;  Service: Thoracic;  Laterality: Left;   VIDEO ASSISTED THORACOSCOPY Left 11/25/2014   Procedure: VIDEO ASSISTED THORACOSCOPY;  Surgeon: Loreli Slot, MD;  Location: [facility] OR;  Service: Thoracic;  Laterality: Left;   VIDEO BRONCHOSCOPY WITH ENDOBRONCHIAL NAVIGATION N/A 06/19/2017   Procedure: VIDEO BRONCHOSCOPY;  Surgeon: Loreli Slot, MD;  Location: [facility] OR;  Service: Thoracic;  Laterality: N/A;   VIDEO BRONCHOSCOPY WITH ENDOBRONCHIAL ULTRASOUND N/A 11/25/2014   Procedure: VIDEO BRONCHOSCOPY WITH ENDOBRONCHIAL ULTRASOUND;  Surgeon: Loreli Slot, MD;  Location: [facility] OR;  Service: Thoracic;  Laterality: N/A;    I have reviewed the social history and family history with the patient and they are unchanged from previous note.  ALLERGIES:  is allergic to beef-derived products, other, chocolate, ibuprofen, niacin and related, and tramadol.  MEDICATIONS:  Current Outpatient  Medications  Medication Sig Dispense Refill   aspirin 81 MG EC tablet      atenolol (TENORMIN) 50 MG tablet Take 50 mg by mouth at bedtime.      b complex vitamins capsule Take 1 capsule by mouth daily.     BINAXNOW COVID-19 AG HOME TEST KIT See admin instructions.     calcium carbonate (OSCAL) 1500 (600 Ca) MG TABS tablet      calcium-vitamin D (OSCAL WITH D) 500-200 MG-UNIT per tablet Take 1 tablet by mouth at bedtime.      Cholecalciferol (VITAMIN D3) 1000 units CAPS Take by mouth daily.     denosumab (PROLIA) 60 MG/ML SOSY injection      Digestive Enzymes (ENZYME DIGEST PO) Take 5 mLs by mouth 3 (three) times daily with meals.     FLUZONE HIGH-DOSE QUADRIVALENT 0.7 ML SUSY      levothyroxine (SYNTHROID, LEVOTHROID) 112 MCG tablet Take 112 mcg by mouth daily before breakfast.      lisinopril (PRINIVIL,ZESTRIL) 10 MG tablet Take 1 tablet (10 mg total) by mouth daily. (Patient taking differently: Take 10 mg by mouth 2 (two) times daily.) 30 tablet 1   magic mouthwash (nystatin, lidocaine, diphenhydrAMINE, alum & mag hydroxide) suspension Swish and swallow 5 mLs by mouth 3 (three) times daily as needed for mouth pain. 140 mL 1   meclizine (ANTIVERT) 12.5 MG tablet Take 12.5 mg by mouth 3 (three) times daily as needed for dizziness.     Multiple Vitamin (MULTIVITAMIN) tablet Take 1 tablet by mouth every morning.      Omega-3 1000 MG CAPS Take 1 g by mouth daily.     osimertinib mesylate (TAGRISSO) 80 MG tablet Take 1 tablet (80 mg total) by mouth daily. 30 tablet 0   Polyvinyl Alcohol-Povidone (REFRESH OP) Apply 1 drop to eye as needed (DRY EYES).      Triamcinolone Acetonide (NASACORT AQ NA) Place 1 spray into the nose at bedtime as needed (CONGESTION).     vitamin E 180 MG (400 UNITS) capsule Take 400 Units by mouth every Monday, Wednesday, and Friday. Mon Wed Fri     No current facility-administered medications for this visit.    PHYSICAL EXAMINATION: ECOG PERFORMANCE STATUS: 2 -  Symptomatic, <50% confined to bed  Vitals:   09/10/22 1330  BP: Marland Kitchen)  163/70  Pulse: 65  Resp: 18  Temp: 97.7 F (36.5 C)  SpO2: 100%   Wt Readings from Last 3 Encounters:  09/10/22 112 lb 4.8 oz (50.9 kg)  08/02/22 115 lb 4.8 oz (52.3 kg)  07/05/22 114 lb 4.8 oz (51.8 kg)     GENERAL:alert, no distress and comfortable SKIN: skin color, texture, turgor are normal, no rashes or significant lesions EYES: normal, Conjunctiva are pink and non-injected, sclera clear NECK: supple, thyroid normal size, non-tender, without nodularity LYMPH:  no palpable lymphadenopathy in the cervical, axillary  LUNGS: clear to auscultation and percussion with normal breathing effort, slightly decreased breath sounds on left side HEART: regular rate & rhythm and no murmurs and no lower extremity edema ABDOMEN:abdomen soft, non-tender and normal bowel sounds Musculoskeletal:no cyanosis of digits and no clubbing  NEURO: alert & oriented x 3 with fluent speech, no focal motor/sensory deficits  LABORATORY DATA:  I have reviewed the data as listed    Latest Ref Rng & Units 09/10/2022    1:18 PM 09/05/2022    9:05 AM 08/02/2022   12:15 PM  CBC  WBC 4.0 - 10.5 K/uL 4.3  3.5  4.5   Hemoglobin 12.0 - 15.0 g/dL 11.912.6  14.712.5  82.912.2   Hematocrit 36.0 - 46.0 % 38.2  37.2  37.1   Platelets 150 - 400 K/uL 94  86  96         Latest Ref Rng & Units 09/10/2022    1:18 PM 09/05/2022    9:05 AM 08/02/2022   12:15 PM  CMP  Glucose 70 - 99 mg/dL 93  93  95   BUN 8 - 23 mg/dL 18  19  19    Creatinine 0.44 - 1.00 mg/dL 5.620.79  1.300.76  8.650.63   Sodium 135 - 145 mmol/L 137  138  140   Potassium 3.5 - 5.1 mmol/L 4.2  4.0  4.7   Chloride 98 - 111 mmol/L 103  104  104   CO2 22 - 32 mmol/L 28  29  31    Calcium 8.9 - 10.3 mg/dL 9.5  9.3  8.7   Total Protein 6.5 - 8.1 g/dL 7.2  7.3  6.5   Total Bilirubin 0.3 - 1.2 mg/dL 0.6  0.7  0.5   Alkaline Phos 38 - 126 U/L 63  68  81   AST 15 - 41 U/L 26  25  25    ALT 0 - 44 U/L 21  21  23         RADIOGRAPHIC STUDIES: I have personally reviewed the radiological images as listed and agreed with the findings in the report. No results found.    No orders of the defined types were placed in this encounter.  All questions were answered. The patient knows to call the clinic with any problems, questions or concerns. No barriers to learning was detected. The total time spent in the appointment was 30 minutes.     Malachy MoodYan Asuna Peth, MD 09/10/2022   I, Sharlette DenseJohn Wicker, CMA, am acting as scribe for Malachy MoodYan Cheila Wickstrom, MD.   I have reviewed the above documentation for accuracy and completeness, and I agree with the above.

## 2022-09-19 ENCOUNTER — Other Ambulatory Visit: Payer: Self-pay

## 2022-09-21 ENCOUNTER — Other Ambulatory Visit: Payer: Self-pay

## 2022-09-24 ENCOUNTER — Other Ambulatory Visit: Payer: Self-pay | Admitting: Hematology

## 2022-09-24 ENCOUNTER — Other Ambulatory Visit: Payer: Self-pay

## 2022-09-24 MED ORDER — OSIMERTINIB MESYLATE 40 MG PO TABS
40.0000 mg | ORAL_TABLET | Freq: Every day | ORAL | 1 refills | Status: DC
  Filled 2022-09-24: qty 30, 30d supply, fill #0
  Filled 2022-10-24: qty 30, 30d supply, fill #1

## 2022-09-28 ENCOUNTER — Other Ambulatory Visit (HOSPITAL_COMMUNITY): Payer: Self-pay

## 2022-10-04 ENCOUNTER — Ambulatory Visit (HOSPITAL_COMMUNITY)
Admission: RE | Admit: 2022-10-04 | Discharge: 2022-10-04 | Disposition: A | Discharge status: Home / Self Care | Payer: Medicare HMO | Source: Ambulatory Visit | Attending: Hematology | Admitting: Hematology

## 2022-10-04 DIAGNOSIS — C3490 Malignant neoplasm of unspecified part of unspecified bronchus or lung: Secondary | ICD-10-CM | POA: Diagnosis not present

## 2022-10-04 DIAGNOSIS — J90 Pleural effusion, not elsewhere classified: Secondary | ICD-10-CM | POA: Diagnosis not present

## 2022-10-04 DIAGNOSIS — C3432 Malignant neoplasm of lower lobe, left bronchus or lung: Secondary | ICD-10-CM | POA: Diagnosis not present

## 2022-10-07 NOTE — Assessment & Plan Note (Signed)
pT1b N0 M0 stage IA, ER positive PR positive and HER-2 negative. -diagnosed in 05/2014, s/p right lumpectomy and adjuvant radiation. She declined antiestrogens due to cost and concern with side effects.  -most recent mammogram 09/12/21 was negative. -She is clinically stable from a breast standpoint. Physical exam was unremarkable. There is no clinical concern for recurrence. -Continue Surveillance.  

## 2022-10-07 NOTE — Assessment & Plan Note (Addendum)
Stage I left lung adenocarcinoma, pT1aN0M0 in 2016, LLL recurrence in 06/2017, LUL recurrence in 10/2020, malignant left pleural effusion in 04/2022 -initially diagnosed in 11/2014, s/p LLL segmentectomy by Dr. Dorris Fetch, pathology showing stage I adenocarcinoma -She had cancer recurrence in LLL in 06/2017 and in LUL in 10/2020. She was treated with radiation in 07/2017 and in 11/2020 with Dr. Mitzi Hansen.  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  The left pleura is not hypermetabolic on the PET scan. -She previously tolerated Tagrisso 40 mg daily very well, I increased the dose to 80 mg daily on last visit, she did not tolerated well, and have reduced dose back to 40 mg daily -Restaging CT scan from Oct 04, 2022 showed

## 2022-10-08 ENCOUNTER — Other Ambulatory Visit: Payer: Self-pay

## 2022-10-08 ENCOUNTER — Inpatient Hospital Stay: Payer: Medicare HMO | Attending: Hematology | Admitting: Hematology

## 2022-10-08 ENCOUNTER — Inpatient Hospital Stay: Payer: Medicare HMO

## 2022-10-08 ENCOUNTER — Encounter: Payer: Self-pay | Admitting: Hematology

## 2022-10-08 VITALS — BP 167/66 | HR 72 | Temp 98.0°F | Resp 18 | Ht 65.0 in | Wt 111.0 lb

## 2022-10-08 DIAGNOSIS — Z170 Estrogen receptor positive status [ER+]: Secondary | ICD-10-CM

## 2022-10-08 DIAGNOSIS — C3412 Malignant neoplasm of upper lobe, left bronchus or lung: Secondary | ICD-10-CM | POA: Diagnosis not present

## 2022-10-08 DIAGNOSIS — C50411 Malignant neoplasm of upper-outer quadrant of right female breast: Secondary | ICD-10-CM | POA: Diagnosis not present

## 2022-10-08 DIAGNOSIS — Z923 Personal history of irradiation: Secondary | ICD-10-CM | POA: Diagnosis not present

## 2022-10-08 DIAGNOSIS — C3432 Malignant neoplasm of lower lobe, left bronchus or lung: Secondary | ICD-10-CM | POA: Diagnosis not present

## 2022-10-08 DIAGNOSIS — Z853 Personal history of malignant neoplasm of breast: Secondary | ICD-10-CM | POA: Diagnosis not present

## 2022-10-08 LAB — CMP (CANCER CENTER ONLY)
ALT: 20 U/L (ref 0–44)
AST: 23 U/L (ref 15–41)
Albumin: 4.1 g/dL (ref 3.5–5.0)
Alkaline Phosphatase: 71 U/L (ref 38–126)
Anion gap: 6 (ref 5–15)
BUN: 18 mg/dL (ref 8–23)
CO2: 29 mmol/L (ref 22–32)
Calcium: 8.8 mg/dL — ABNORMAL LOW (ref 8.9–10.3)
Chloride: 105 mmol/L (ref 98–111)
Creatinine: 0.83 mg/dL (ref 0.44–1.00)
GFR, Estimated: >60 mL/min (ref >60)
Glucose, Bld: 93 mg/dL (ref 70–99)
Potassium: 4.2 mmol/L (ref 3.5–5.1)
Sodium: 140 mmol/L (ref 135–145)
Total Bilirubin: 0.6 mg/dL (ref 0.3–1.2)
Total Protein: 6.8 g/dL (ref 6.5–8.1)

## 2022-10-08 LAB — CBC WITH DIFFERENTIAL (CANCER CENTER ONLY)
Abs Immature Granulocytes: 0 10*3/uL (ref 0.00–0.07)
Basophils Absolute: 0 10*3/uL (ref 0.0–0.1)
Basophils Relative: 1 %
Eosinophils Absolute: 0.1 10*3/uL (ref 0.0–0.5)
Eosinophils Relative: 4 %
HCT: 35.8 % — ABNORMAL LOW (ref 36.0–46.0)
Hemoglobin: 11.7 g/dL — ABNORMAL LOW (ref 12.0–15.0)
Immature Granulocytes: 0 %
Lymphocytes Relative: 16 %
Lymphs Abs: 0.6 10*3/uL — ABNORMAL LOW (ref 0.7–4.0)
MCH: 29.4 pg (ref 26.0–34.0)
MCHC: 32.7 g/dL (ref 30.0–36.0)
MCV: 89.9 fL (ref 80.0–100.0)
Monocytes Absolute: 0.3 10*3/uL (ref 0.1–1.0)
Monocytes Relative: 9 %
Neutro Abs: 2.7 10*3/uL (ref 1.7–7.7)
Neutrophils Relative %: 70 %
Platelet Count: 100 10*3/uL — ABNORMAL LOW (ref 150–400)
RBC: 3.98 MIL/uL (ref 3.87–5.11)
RDW: 14.2 % (ref 11.5–15.5)
WBC Count: 3.8 10*3/uL — ABNORMAL LOW (ref 4.0–10.5)
nRBC: 0 % (ref 0.0–0.2)

## 2022-10-08 NOTE — Progress Notes (Signed)
[facility]   [phone] [phone]   Clinic Follow up Note   Patient Care Team: Rodrigo Ran, MD as PCP - General (Internal Medicine) Loreli Slot, MD as Consulting Physician (Cardiothoracic Surgery) Alfredo Martinez, MD as Consulting Physician (Urology) Malachy Mood, MD as Consulting Physician (Hematology) Lurline Hare, MD as Consulting Physician (Radiation Oncology) Ronny Bacon, PA-C as Physician Assistant (Radiation Oncology) Imaging, [facility] as Consulting Physician (Diagnostic Radiology)  Date of Service:  10/08/2022  CHIEF COMPLAINT: f/u of  lung cancer     CURRENT THERAPY:   Osimertinib Started 06/23/2022   ASSESSMENT:  Melissa Snyder is a 89 y.o. female with   Breast cancer of upper-outer quadrant of right female breast (HCC) pT1b N0 M0 stage IA, ER positive PR positive and HER-2 negative. -diagnosed in 05/2014, s/p right lumpectomy and adjuvant radiation. She declined antiestrogens due to cost and concern with side effects.  -most recent mammogram 09/12/21 was negative. -She is clinically stable from a breast standpoint. Physical exam was unremarkable. There is no clinical concern for recurrence. -Continue Surveillance  Primary cancer of left lower lobe of lung (HCC) Stage I left lung adenocarcinoma, pT1aN0M0 in 2016, LLL recurrence in 06/2017, LUL recurrence in 10/2020, malignant left pleural effusion in 04/2022 -initially diagnosed in 11/2014, s/p LLL segmentectomy by Dr. Dorris Fetch, pathology showing stage I adenocarcinoma -She had cancer recurrence in LLL in 06/2017 and in LUL in 10/2020. She was treated with radiation in 07/2017 and in 11/2020 with Dr. Mitzi Hansen.  -She developed cancer recurrence with malignant left pleural effusion in November 2023 -[facility] showed EGFR L858R mutation(+), which indicating good response to EGFR inhibitor, I have started her on osimertinib in Jan 2024 -she  has slightly prolonged QTc, will monitor closely when she is on Osimertinib, repeated EKG showed normal QTc --PET scan 08/02/2022 showed mildly hypermetabolic right upper lung nodule, morphologically benign appearing mediastinal and hilar lymphadenopathy with metabolic activities, no other definitive metastasis.  The left pleura is not hypermetabolic on the PET scan. -She previously tolerated Tagrisso 40 mg daily very well, I increased the dose to 80 mg daily on last visit, she did not tolerated well, and have reduced dose back to 40 mg daily -Restaging CT scan from Oct 04, 2022 showed stable left pleural effusion, previously mild hypermetabolic right middle lung nodule is less solid appearing, no other new lesions.  I personally reviewed her CT scan images and discussed the findings with her.  She is tolerating Tagrisso 40 mg daily well, except of fatigue.  She is willing to continue. -Patient has stopped going to the gym for Silver sneakers program, I encouraged her to exercise at home, 10-15 minutes a few times a day, and eat high-protein diet, to improve her energy level.     PLAN: -Discuss Restaging CT scan 5/2, which showed stable disease -Lab reviewed -CMP pending -recommend high protein diet and exercise at home -pt agree to continue Tagrisso 40 mg daily  -lab and f/u in 3 months      SUMMARY OF ONCOLOGIC HISTORY: Oncology History Overview Note   Cancer Staging  Breast cancer of upper-outer quadrant of right female breast [facility]) Staging form: Breast, AJCC 7th Edition - Clinical stage from 05/19/2014: Stage IA (T1b, N0, M0) - Signed by Hubbard Hartshorn, NP on 11/17/2015 - Pathologic stage from 10/17/2014: Stage IA (T1b, N0, cM0) - Signed by Lurline Hare, MD on 10/17/2014  Primary cancer of left lower lobe of  lung [facility]) Staging form: Lung, AJCC 7th Edition - Clinical stage from 12/14/2014: Stage IA (T1a, N0, M0) - Unsigned    Breast cancer of upper-outer quadrant of right  female breast (HCC)  05/04/2014 Initial Diagnosis   Breast cancer   05/19/2014 Imaging   Diagnostic mammogram and ultrasound showed a 7 mm irregular suspicious mass located in the right breast at the 10:00 position, 7 cm from the nipple. Otherwise negative.   05/19/2014 Initial Biopsy   Right breast biopsy (UOQ): IDC, grade 2.    05/19/2014 Receptors her2   ER 100% positive, PR 35% positive, HER-2 negative, Ki67 8%   06/07/2014 Miscellaneous   Genetic testing normal.  APC, ATM, BARD1, BMPR1A, BRCA1, BRCA2, BRIP1, CDH1, CDK4, CDKN2A, CHEK2, EPCAM, GREM1, MLH1, MRE11A, MSH2, MSH6, MUTYH, NBN, NF1, PALB2, PMS2, POLD1, POLE, PTEN, RAD50, RAD51D, SMAD4, SMARCA4, STK11, and TP53 tested.    07/23/2014 Surgery   Right breast lumpectomy with SLNB Ezzard Standing) showed invasive ductal carcinoma & DCIS; negative surgical margins.   07/23/2014 Pathology Results   Invasive ductal carcinoma, tumor size 0.8 cm, grade 1, no lymphovascular invasion, margins were negative, 2 sentinel lymph nodes were negative. (+) DCIS.   07/23/2014 Pathologic Stage   pT1b, pN0: Stage IA    09/16/2014 - 10/07/2014 Radiation Therapy   XRT completed Michell Heinrich). Right breast/ 42.72 Gy at 2.67 Gy per fraction x 21 fractions.     Anti-estrogen oral therapy   Aromasin prescribed in 01/2015 but Patient declines anti-estrogen therapy at this time d/t high co-pay of medication and possible side effects of AI therapy.    05/02/2015 Mammogram   Diagnostic TOMO bilat mammogram: No evidence of malignancy in either breast. Lumpectomy changes in right breast. Diagnostic mammo suggested in 1 year.    Primary cancer of left lower lobe of lung (HCC)  10/25/2013 Imaging   CXR done for stroke protocol. No acute chest findings. Chronic lung disease with biapical scarring. Developing nodule in (L) apex cannot be excluded based on exam, athough may be d/t overlap of osseous structures.    10/27/2013 Imaging   CXR: Nodular opacity in mid (L) apex and  appears more prominent than prior study. Area that appeared masslike in LUL near apex on recent CXR not seen at this time.  Underlying emphysema present. No edema or consolidation   11/12/2013 Imaging   CT chest: At left lung apex, there is asymmetric pleural parenchymal scarring. No discrete lesion.  In superior segment of LLL, there are 1 dominant ground-glass opacity measuring 16 mm. 2 addt'l ground-glass opacities appreciated. F/U with CT in 3 months   02/11/2014 Imaging   CT chest: Several small bilat faint nodular densities, largest measures 1.3 cm over superior segment LLL. No new nodules, no adenopathy. Cannot exclude metastatic disease given pt's h/o melanoma. Recommend PET-CT   02/23/2014 PET scan   Persistent semi-solid nodule in superior segment LLL with low metabolic activity. Moderately metabolic subcarinal & hilar LN are likely reactive.    05/25/2014 Imaging   CT chest: Persistent & similar size dominant LLL sub-solid nodule. Adenocarcinoma is considered. Biopsy or resection strongly considered. Additional scattered small ground-glass nodules stable.    06/02/2014 Imaging   MRI brain: Grossly stable appearance of left planum sphenoidale meningioma measuring 20 x 14 mm. Meningioma does not extend to left orbital apex & contacts left optic nerve. Stable right posterior clinoid meningioma .    09/27/2014 Imaging   CT chest: Further enlargment of central solid component in sub-solid LLL pulm nodule.  Findings remain concerning for adenoca. Additional scattered small bilat ground-glass nodules and biapical scarring stable. No adenopathy.     11/25/2014 Surgery   Video bronchoscopy with endobronchial ultrasound; Video assisted thoracoscopy; Thoracoscopic LLL superior segmentectomy; Mediastinal lymph node dissection; Cyro intercostal nerve block Dorris Fetch)   11/25/2014 Pathology Results   Superior segment lung resection (LLL). Adenocarcinoma, well-diff, spanning 1.5 cm. Surgical margins  negative. 5 LNs negative.    11/25/2014 Pathologic Stage   pT1a, pN0: Stage IA    11/25/2014 Initial Diagnosis   Primary cancer of left lower lobe of lung (HCC)   05/31/2015 Imaging   CT chest: No evidence of local recurrent at superior segmentectomy in LLL. New small nodular focus of consolidation surrounding thin parenchymal band. Additional sub-cm ground-glass and solid nodules favored to be benign. No thoracic adenopathy   08/11/2015 Imaging   CT chest: No evidence of residual or recurrent tumor in LLL. Stable small nodular focus in LLL with thin surrounding parenchymal band, favored to be benign.  Multiple small ground-glass nodules unchanged.    06/19/2017 Pathology Results   Diagnosis 1. Lung, biopsy, Left lower lobe - ADENOCARCINOMA. - SEE COMMENT. 2. Lung, biopsy, Left lower lobe - BENIGN LUNG PARENCHYMA. - THERE IS NO EVIDENCE OF MALIGNANCY.   07/09/2017 - 07/22/2017 Radiation Therapy   Radiation treatment dates: 07/09/17-07/22/17 by Dr. Mitzi Hansen    Site/dose:  Left lung/ 50 Gy in 10 fractions   Beams/energy:  IMRT/ 6X   Narrative: The patient tolerated radiation treatment relatively well. She denied pain, fatigue, or difficulty swallowing. Skin to the treatment area appears warm, dry, and normal in color.   06/18/2019 Imaging   CT Chest  IMPRESSION: 1. There are 2 round solid nodules within the left lower lobe which have increased in size when compared with the previous exam, suspicious. Additionally, along the area of postsurgical change around the oblique fissure of the left lung there is progressive thickening with persistent loculated fluid. Cannot rule out local tumor recurrence along the suture line. 2. No significant change in the appearance of bilateral upper lobe sub solid lung nodules. 3. Aortic Atherosclerosis (ICD10-I70.0). Coronary artery calcifications.     07/07/2019 PET scan   IMPRESSION: 1. No evidence of hypermetabolic local tumor recurrence in the  left lung. Low level nonfocal uptake along the segmentectomy site in the left lower lobe favors post treatment change. Continued chest CT surveillance warranted. 2. Two small solid left lower lobe pulmonary nodules, largest 0.7 cm, below PET resolution. Given the growth of these nodules since 10/03/2018 chest CT, metastatic disease remains on the differential. Close chest CT surveillance recommended. 3. Nonspecific hypermetabolism within nonenlarged subcarinal and bilateral hilar lymph nodes, not substantially changed since 03/04/2017 PET-CT study, favoring reactive uptake. 4. No hypermetabolic distant metastatic disease. 5. Chronic findings include: Aortic Atherosclerosis (ICD10-I70.0). Subcentimeter ground-glass pulmonary nodules in the upper lobes are unchanged and are below PET resolution. Moderate sigmoid diverticulosis. Chronic right maxillary sinusitis.   04/13/2020 Imaging   CT Chest  IMPRESSION: 1. Primarily similar appearance of the lungs compared to 10/14/2019. Left sided surgical sutures with similar solid and subsolid pulmonary nodules as detailed above. A right middle lobe pleural based 5 mm nodule is new since the prior exam, warranting follow-up attention. 2. No thoracic adenopathy. 3. Aortic atherosclerosis (ICD10-I70.0), coronary artery atherosclerosis and emphysema (ICD10-J43.9).   10/10/2020 Imaging   CT chest  IMPRESSION: Postsurgical and post radiation changes in the lungs bilaterally, as above.   12 x 10 mm left lower lobe  nodule, mildly progressive. Additional bilobed nodule in the posterior left lower lobe, also favored to be progressive. PET-CT is suggested for further evaluation.   Aortic Atherosclerosis (ICD10-I70.0) and Emphysema (ICD10-J43.9).   11/22/2020 - 12/02/2020 Radiation Therapy   Site Technique Total Dose (Gy) Dose per Fx (Gy) Completed Fx Beam Energies  Lung, Left: Lung_Lt_LLL IMRT 60/60 12 5/5 6XFFF     05/22/2021 Imaging   EXAM: CT  CHEST WITHOUT CONTRAST  IMPRESSION: 1. Interval development of a new area of volume loss, ground-glass opacity, and bronchiectasis in the posterior left lower lobe. Imaging features are compatible with radiation therapy. 2. The posterior left lung nodules, including index 12 x 10 mm nodule described previously have become incorporated into the post treatment change on the current study. 3. Interval resolution of the posterior right middle lobe nodularity seen previously. 4. Other scattered areas of consolidative opacity or similar. 5. Trace left pleural effusion. 6. Aortic Atherosclerosis (ICD10-I70.0).      INTERVAL HISTORY:  Melissa Snyder is here for a follow up of  lung cancer . She was last seen by me on 09/10/2022. She presents to the clinic alone. Pt state that she has some fatigue, but overall she is ok. Pt state that she has some SOB, when being mobile. Pt state that she goes to the gym and does stretching exercises. Pt has some joint stiffness in the AM.     All other systems were reviewed with the patient and are negative.  MEDICAL HISTORY:  Past Medical History:  Diagnosis Date   Adenocarcinoma (HCC)    recurrent/notes 07/03/2017   Blind left eye 10/2013   cva   Breast cancer (HCC) 05/2014   ER+/PR+/Her2-     LUNG CANCER   Bundle branch block left    Chest pain    Family history of bladder cancer    Family history of breast cancer    Family history of prostate cancer    GERD (gastroesophageal reflux disease)    History of blood transfusion    age 27   Hyperlipidemia    Hypertension    Hypothyroid    Lung nodule    Melanoma in situ of face (HCC) JUNE 2015   LEFT CHEEK   Meningioma (HCC)    brain lining   Migraine    reports only having one   Osteoarthritis    Personal history of radiation therapy    Radiation 09/16/14-10/07/14   Right  Breast  21 fractions   Skin cancer    Stroke (HCC) 2015   Blind in left eye as a result    SURGICAL HISTORY: Past  Surgical History:  Procedure Laterality Date   ABDOMINAL HYSTERECTOMY  ~ 1997   APPENDECTOMY     age 29   BILATERAL SALPINGOOPHORECTOMY     BLADDER REPAIR  2009   cystocele   BRAIN MENINGIOMA EXCISION     Gamma knife to Meningioma in brain lining   BREAST BIOPSY     BREAST LUMPECTOMY Right 07/23/2014   invasive ductal   BUNIONECTOMY     bilateral great toe   CARDIAC CATHETERIZATION  2004   CHOLECYSTECTOMY  ~ 1997   COLONOSCOPY     CRYO INTERCOSTAL NERVE BLOCK Left 11/25/2014   Procedure: CRYO INTERCOSTAL NERVE BLOCK;  Surgeon: Loreli Slot, MD;  Location: [facility] OR;  Service: Thoracic;  Laterality: Left;   CT RADIATION THERAPY GUIDE     Gamma radiation -lt frontal-[facility]   DILATION AND CURETTAGE OF UTERUS  X 2   HIP ARTHROPLASTY Left 07/04/2017   Procedure: ARTHROPLASTY BIPOLAR HIP (HEMIARTHROPLASTY);  Surgeon: Jones Broom, MD;  Location: [facility] OR;  Service: Orthopedics;  Laterality: Left;   MELANOMA EXCISION Left 11/05/23, 11/30/13   CHEEK   SEGMENTECOMY Left 11/25/2014   Procedure: LEFT LOWER LOBE SEGMENTECTOMY;  Surgeon: Loreli Slot, MD;  Location: [facility] OR;  Service: Thoracic;  Laterality: Left;   VIDEO ASSISTED THORACOSCOPY Left 11/25/2014   Procedure: VIDEO ASSISTED THORACOSCOPY;  Surgeon: Loreli Slot, MD;  Location: [facility] OR;  Service: Thoracic;  Laterality: Left;   VIDEO BRONCHOSCOPY WITH ENDOBRONCHIAL NAVIGATION N/A 06/19/2017   Procedure: VIDEO BRONCHOSCOPY;  Surgeon: Loreli Slot, MD;  Location: [facility] OR;  Service: Thoracic;  Laterality: N/A;   VIDEO BRONCHOSCOPY WITH ENDOBRONCHIAL ULTRASOUND N/A 11/25/2014   Procedure: VIDEO BRONCHOSCOPY WITH ENDOBRONCHIAL ULTRASOUND;  Surgeon: Loreli Slot, MD;  Location: [facility] OR;  Service: Thoracic;  Laterality: N/A;    I have reviewed the social history and family history with the patient and they are unchanged from previous note.  ALLERGIES:  is allergic to beef-derived products, other, chocolate,  ibuprofen, niacin and related, and tramadol.  MEDICATIONS:  Current Outpatient Medications  Medication Sig Dispense Refill   aspirin 81 MG EC tablet      atenolol (TENORMIN) 50 MG tablet Take 50 mg by mouth at bedtime.      b complex vitamins capsule Take 1 capsule by mouth daily.     BINAXNOW COVID-19 AG HOME TEST KIT See admin instructions.     calcium carbonate (OSCAL) 1500 (600 Ca) MG TABS tablet      calcium-vitamin D (OSCAL WITH D) 500-200 MG-UNIT per tablet Take 1 tablet by mouth at bedtime.      Cholecalciferol (VITAMIN D3) 1000 units CAPS Take by mouth daily.     denosumab (PROLIA) 60 MG/ML SOSY injection      Digestive Enzymes (ENZYME DIGEST PO) Take 5 mLs by mouth 3 (three) times daily with meals.     FLUZONE HIGH-DOSE QUADRIVALENT 0.7 ML SUSY      levothyroxine (SYNTHROID, LEVOTHROID) 112 MCG tablet Take 112 mcg by mouth daily before breakfast.      lisinopril (PRINIVIL,ZESTRIL) 10 MG tablet Take 1 tablet (10 mg total) by mouth daily. (Patient taking differently: Take 10 mg by mouth 2 (two) times daily.) 30 tablet 1   magic mouthwash (nystatin, lidocaine, diphenhydrAMINE, alum & mag hydroxide) suspension Swish and swallow 5 mLs by mouth 3 (three) times daily as needed for mouth pain. 140 mL 1   meclizine (ANTIVERT) 12.5 MG tablet Take 12.5 mg by mouth 3 (three) times daily as needed for dizziness.     Multiple Vitamin (MULTIVITAMIN) tablet Take 1 tablet by mouth every morning.      Omega-3 1000 MG CAPS Take 1 g by mouth daily.     osimertinib mesylate (TAGRISSO) 40 MG tablet Take 1 tablet (40 mg total) by mouth daily. 30 tablet 1   Polyvinyl Alcohol-Povidone (REFRESH OP) Apply 1 drop to eye as needed (DRY EYES).      Triamcinolone Acetonide (NASACORT AQ NA) Place 1 spray into the nose at bedtime as needed (CONGESTION).     vitamin E 180 MG (400 UNITS) capsule Take 400 Units by mouth every Monday, Wednesday, and Friday. Mon Wed Fri     No current facility-administered medications  for this visit.    PHYSICAL EXAMINATION: ECOG PERFORMANCE STATUS: 2 - Symptomatic, <50% confined to bed  Vitals:   10/08/22  1023  BP: (!) 167/66  Pulse: 72  Resp: 18  Temp: 98 F (36.7 C)  SpO2: 100%   Wt Readings from Last 3 Encounters:  10/08/22 111 lb (50.3 kg)  09/10/22 112 lb 4.8 oz (50.9 kg)  08/02/22 115 lb 4.8 oz (52.3 kg)     GENERAL:alert, no distress and comfortable SKIN: skin color normal, no rashes or significant lesions EYES: normal, Conjunctiva are pink and non-injected, sclera clear  NEURO: alert & oriented x 3 with fluent speech LABORATORY DATA:  I have reviewed the data as listed    Latest Ref Rng & Units 10/08/2022    9:54 AM 09/10/2022    1:18 PM 09/05/2022    9:05 AM  CBC  WBC 4.0 - 10.5 K/uL 3.8  4.3  3.5   Hemoglobin 12.0 - 15.0 g/dL 40.9  81.1  91.4   Hematocrit 36.0 - 46.0 % 35.8  38.2  37.2   Platelets 150 - 400 K/uL 100  94  86         Latest Ref Rng & Units 10/08/2022    9:54 AM 09/10/2022    1:18 PM 09/05/2022    9:05 AM  CMP  Glucose 70 - 99 mg/dL 93  93  93   BUN 8 - 23 mg/dL 18  18  19    Creatinine 0.44 - 1.00 mg/dL 7.82  9.56  2.13   Sodium 135 - 145 mmol/L 140  137  138   Potassium 3.5 - 5.1 mmol/L 4.2  4.2  4.0   Chloride 98 - 111 mmol/L 105  103  104   CO2 22 - 32 mmol/L 29  28  29    Calcium 8.9 - 10.3 mg/dL 8.8  9.5  9.3   Total Protein 6.5 - 8.1 g/dL 6.8  7.2  7.3   Total Bilirubin 0.3 - 1.2 mg/dL 0.6  0.6  0.7   Alkaline Phos 38 - 126 U/L 71  63  68   AST 15 - 41 U/L 23  26  25    ALT 0 - 44 U/L 20  21  21        RADIOGRAPHIC STUDIES: I have personally reviewed the radiological images as listed and agreed with the findings in the report. No results found.    No orders of the defined types were placed in this encounter.  All questions were answered. The patient knows to call the clinic with any problems, questions or concerns. No barriers to learning was detected. The total time spent in the appointment was 30 minutes.      Malachy Mood, MD 10/08/2022   Carolin Coy, CMA, am acting as scribe for Malachy Mood, MD.   I have reviewed the above documentation for accuracy and completeness, and I agree with the above.

## 2022-10-11 ENCOUNTER — Other Ambulatory Visit: Payer: Medicare HMO

## 2022-10-11 ENCOUNTER — Ambulatory Visit: Payer: Medicare HMO | Admitting: Hematology

## 2022-10-18 ENCOUNTER — Other Ambulatory Visit (HOSPITAL_COMMUNITY): Payer: Self-pay

## 2022-10-22 ENCOUNTER — Other Ambulatory Visit (HOSPITAL_COMMUNITY): Payer: Self-pay

## 2022-10-24 ENCOUNTER — Other Ambulatory Visit (HOSPITAL_COMMUNITY): Payer: Self-pay

## 2022-11-01 ENCOUNTER — Other Ambulatory Visit (HOSPITAL_COMMUNITY): Payer: Self-pay

## 2022-11-01 ENCOUNTER — Telehealth: Payer: Self-pay

## 2022-11-01 NOTE — Telephone Encounter (Signed)
Pt called stating its time for her to have her Prolia Injection and wants to know if it's OK to take this injection while taking Tagrisso.  Notified Dr. Mosetta Putt and Oral Chemo Pharmacist [facility] [place], [facility], and Jinger Neighbors, CPhT).  Awaiting their response so this RN can notify pt.

## 2022-11-02 ENCOUNTER — Telehealth: Payer: Self-pay

## 2022-11-02 NOTE — Telephone Encounter (Signed)
Spoke with pt via telephone to inform pt of the response from pharmacy regarding drug interaction of pt's Prolia injection and her oral chemo Tagrisso.  Informed pt that our pharmacist reviewed the 2 medications and there is NO interaction between the 2 medications.  Informed pt she should be OK with taking her Prolia injection while on Tagrisso.  Pt verbalized understanding and had no further questions or concerns.

## 2022-11-19 ENCOUNTER — Other Ambulatory Visit (HOSPITAL_COMMUNITY): Payer: Self-pay | Admitting: *Deleted

## 2022-11-20 ENCOUNTER — Ambulatory Visit (HOSPITAL_COMMUNITY)
Admission: RE | Admit: 2022-11-20 | Discharge: 2022-11-20 | Disposition: A | Discharge status: Home / Self Care | Payer: Medicare HMO | Source: Ambulatory Visit | Attending: Internal Medicine | Admitting: Internal Medicine

## 2022-11-20 DIAGNOSIS — M810 Age-related osteoporosis without current pathological fracture: Secondary | ICD-10-CM | POA: Insufficient documentation

## 2022-11-20 MED ORDER — DENOSUMAB 60 MG/ML ~~LOC~~ SOSY: 60.0000 mg | PREFILLED_SYRINGE | Freq: Once | SUBCUTANEOUS | Status: DC

## 2022-11-20 MED ORDER — DENOSUMAB 60 MG/ML ~~LOC~~ SOSY
PREFILLED_SYRINGE | SUBCUTANEOUS | Status: AC
  Filled 2022-11-20: qty 1

## 2022-11-27 DIAGNOSIS — H524 Presbyopia: Secondary | ICD-10-CM | POA: Diagnosis not present

## 2022-11-27 DIAGNOSIS — H2512 Age-related nuclear cataract, left eye: Secondary | ICD-10-CM | POA: Diagnosis not present

## 2022-11-29 ENCOUNTER — Other Ambulatory Visit (HOSPITAL_COMMUNITY): Payer: Self-pay

## 2022-12-03 ENCOUNTER — Other Ambulatory Visit: Payer: Self-pay | Admitting: Nurse Practitioner

## 2022-12-03 ENCOUNTER — Other Ambulatory Visit: Payer: Self-pay

## 2022-12-03 ENCOUNTER — Other Ambulatory Visit (HOSPITAL_COMMUNITY): Payer: Self-pay

## 2022-12-03 MED ORDER — OSIMERTINIB MESYLATE 40 MG PO TABS
40.0000 mg | ORAL_TABLET | Freq: Every day | ORAL | 1 refills | Status: DC
  Filled 2022-12-03: qty 30, 30d supply, fill #0
  Filled 2023-01-03: qty 30, 30d supply, fill #1

## 2022-12-04 ENCOUNTER — Other Ambulatory Visit: Payer: Self-pay

## 2022-12-07 ENCOUNTER — Other Ambulatory Visit: Payer: Self-pay

## 2023-01-03 ENCOUNTER — Other Ambulatory Visit: Payer: Self-pay

## 2023-01-07 ENCOUNTER — Other Ambulatory Visit: Payer: Self-pay

## 2023-01-07 ENCOUNTER — Inpatient Hospital Stay: Payer: Medicare HMO | Attending: Hematology

## 2023-01-07 ENCOUNTER — Encounter: Payer: Self-pay | Admitting: Nurse Practitioner

## 2023-01-07 ENCOUNTER — Inpatient Hospital Stay: Payer: Medicare HMO | Admitting: Nurse Practitioner

## 2023-01-07 VITALS — BP 138/80 | HR 72 | Temp 97.6°F | Resp 18 | Ht 65.0 in | Wt 108.7 lb

## 2023-01-07 DIAGNOSIS — R5383 Other fatigue: Secondary | ICD-10-CM | POA: Insufficient documentation

## 2023-01-07 DIAGNOSIS — Z170 Estrogen receptor positive status [ER+]: Secondary | ICD-10-CM | POA: Diagnosis not present

## 2023-01-07 DIAGNOSIS — C50411 Malignant neoplasm of upper-outer quadrant of right female breast: Secondary | ICD-10-CM | POA: Diagnosis not present

## 2023-01-07 DIAGNOSIS — Z853 Personal history of malignant neoplasm of breast: Secondary | ICD-10-CM | POA: Diagnosis not present

## 2023-01-07 DIAGNOSIS — C3432 Malignant neoplasm of lower lobe, left bronchus or lung: Secondary | ICD-10-CM | POA: Insufficient documentation

## 2023-01-07 DIAGNOSIS — R634 Abnormal weight loss: Secondary | ICD-10-CM | POA: Diagnosis not present

## 2023-01-07 DIAGNOSIS — Z923 Personal history of irradiation: Secondary | ICD-10-CM | POA: Diagnosis not present

## 2023-01-07 DIAGNOSIS — D61818 Other pancytopenia: Secondary | ICD-10-CM | POA: Diagnosis not present

## 2023-01-07 DIAGNOSIS — R531 Weakness: Secondary | ICD-10-CM | POA: Insufficient documentation

## 2023-01-07 LAB — CMP (CANCER CENTER ONLY)
ALT: 19 U/L (ref 0–44)
AST: 24 U/L (ref 15–41)
Albumin: 4.2 g/dL (ref 3.5–5.0)
Alkaline Phosphatase: 78 U/L (ref 38–126)
Anion gap: 4 — ABNORMAL LOW (ref 5–15)
BUN: 18 mg/dL (ref 8–23)
CO2: 31 mmol/L (ref 22–32)
Calcium: 9 mg/dL (ref 8.9–10.3)
Chloride: 103 mmol/L (ref 98–111)
Creatinine: 0.76 mg/dL (ref 0.44–1.00)
GFR, Estimated: >60 mL/min (ref >60)
Glucose, Bld: 107 mg/dL — ABNORMAL HIGH (ref 70–99)
Potassium: 3.9 mmol/L (ref 3.5–5.1)
Sodium: 138 mmol/L (ref 135–145)
Total Bilirubin: 0.6 mg/dL (ref 0.3–1.2)
Total Protein: 6.9 g/dL (ref 6.5–8.1)

## 2023-01-07 LAB — CBC WITH DIFFERENTIAL (CANCER CENTER ONLY)
Abs Immature Granulocytes: 0.01 10*3/uL (ref 0.00–0.07)
Basophils Absolute: 0 10*3/uL (ref 0.0–0.1)
Basophils Relative: 1 %
Eosinophils Absolute: 0.1 10*3/uL (ref 0.0–0.5)
Eosinophils Relative: 2 %
HCT: 35.9 % — ABNORMAL LOW (ref 36.0–46.0)
Hemoglobin: 11.8 g/dL — ABNORMAL LOW (ref 12.0–15.0)
Immature Granulocytes: 0 %
Lymphocytes Relative: 14 %
Lymphs Abs: 0.5 10*3/uL — ABNORMAL LOW (ref 0.7–4.0)
MCH: 29.1 pg (ref 26.0–34.0)
MCHC: 32.9 g/dL (ref 30.0–36.0)
MCV: 88.6 fL (ref 80.0–100.0)
Monocytes Absolute: 0.4 10*3/uL (ref 0.1–1.0)
Monocytes Relative: 11 %
Neutro Abs: 2.5 10*3/uL (ref 1.7–7.7)
Neutrophils Relative %: 72 %
Platelet Count: 89 10*3/uL — ABNORMAL LOW (ref 150–400)
RBC: 4.05 MIL/uL (ref 3.87–5.11)
RDW: 14 % (ref 11.5–15.5)
WBC Count: 3.5 10*3/uL — ABNORMAL LOW (ref 4.0–10.5)
nRBC: 0 % (ref 0.0–0.2)

## 2023-01-07 NOTE — Assessment & Plan Note (Addendum)
Stage I left lung adenocarcinoma, pT1aN0M0 in 2016, LLL recurrence in 06/2017, LUL recurrence in 10/2020, malignant left pleural effusion in 04/2022 -initially diagnosed in 11/2014, s/p LLL segmentectomy by Dr. Dorris Fetch, pathology showing stage I adenocarcinoma -She had cancer recurrence in LLL in 06/2017 and in LUL in 10/2020. She was treated with radiation in 07/2017 and in 11/2020 with Dr. Mitzi Hansen.  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  The left pleura is not hypermetabolic on the PET scan. -She previously tolerated Tagrisso 40 mg daily very well, I increased the dose to 80 mg daily on last visit, she did not tolerated well, and have reduced dose back to 40 mg daily -she continues to take Tagrisso 40 mg daily. Mostly doing well. Reports fatigue which makes it difficult for her to participate in usual activities, especially in the afternoons.  -Restaging CT scan from Oct 04, 2022 showed 1. Stable appearance of small to moderate left pleural effusion with changes secondary to external beam radiation within the posterior left lower lobe. 2. Stable, chronic soft tissue thickening along the suture chain within the left midlung. 3. Previously noted tracer avid nodule within the right middle lobe appears less solid on today's study measuring 6 mm. Previously this measured 7 mm. This is favored to represent inflammatory/infectious process. Attention on future surveillance imaging advised.4. Other small solid and part  solid nodules are unchanged in the interval. 5. Aortic Atherosclerosis (ICD10-I70.0). -will repeat CT Chest, abdomen, and pelvis prior to next visit for surveillance.

## 2023-01-07 NOTE — Assessment & Plan Note (Signed)
pT1b N0 M0 stage IA, ER positive PR positive and HER-2 negative. -diagnosed in 05/2014, s/p right lumpectomy and adjuvant radiation. She declined antiestrogens due to cost and concern with side effects.  -most recent mammogram 09/12/21 was negative. -she is overdue for screening mammogram which was ordered 04/2022 -She is clinically stable from a breast standpoint. Physical exam was unremarkable. There is no clinical concern for recurrence. -Continue Surveillance

## 2023-01-07 NOTE — Assessment & Plan Note (Signed)
Recommend she increase physical activity at home. Low-impact exercises were reviewed with her.  Advised she increase protein intake as tolerated.  Follow up closely.

## 2023-01-07 NOTE — Progress Notes (Addendum)
Patient Care Team: Rodrigo Ran, MD as PCP - General (Internal Medicine) Loreli Slot, MD as Consulting Physician (Cardiothoracic Surgery) Alfredo Martinez, MD as Consulting Physician (Urology) Malachy Mood, MD as Consulting Physician (Hematology) Lurline Hare, MD as Consulting Physician (Radiation Oncology) Ronny Bacon, PA-C as Physician Assistant (Radiation Oncology) Imaging, [facility] as Consulting Physician (Diagnostic Radiology)  Clinic Day:  01/07/2023  Referring physician: Rodrigo Ran, MD  ASSESSMENT & PLAN:   Assessment & Plan: Primary cancer of left lower lobe of lung (HCC) Stage I left lung adenocarcinoma, pT1aN0M0 in 2016, LLL recurrence in 06/2017, LUL recurrence in 10/2020, malignant left pleural effusion in 04/2022 -initially diagnosed in 11/2014, s/p LLL segmentectomy by Dr. Dorris Fetch, pathology showing stage I adenocarcinoma -She had cancer recurrence in LLL in 06/2017 and in LUL in 10/2020. She was treated with radiation in 07/2017 and in 11/2020 with Dr. Mitzi Hansen.  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  The left pleura is not hypermetabolic on the PET scan. -She previously tolerated Tagrisso 40 mg daily very well, I increased the dose to 80 mg daily on last visit, she did not tolerated well, and have reduced dose back to 40 mg daily -she continues to take Tagrisso 40 mg daily. Mostly doing well. Reports fatigue which makes it difficult  for her to participate in usual activities, especially in the afternoons.  -Restaging CT scan from Oct 04, 2022 showed 1. Stable appearance of small to moderate left pleural effusion with changes secondary to external beam radiation within the posterior left lower lobe. 2. Stable, chronic soft tissue thickening along the suture chain within the left midlung. 3. Previously noted tracer avid nodule within the right middle lobe appears less solid on today's study measuring 6 mm. Previously this measured 7 mm. This is favored to represent inflammatory/infectious process. Attention on future surveillance imaging advised.4. Other small solid and part solid nodules are unchanged in the interval. 5. Aortic Atherosclerosis (ICD10-I70.0). -will repeat CT Chest, abdomen, and pelvis prior to next visit for surveillance.   Weight loss Patient reports decreased appetite with three pound weight loss since her last visit. Drinking one Boost with extra protein in the mornings, but does not have appetite in afternoon and evening. Recommend she add second Boost in the afternoon, or drink half Boost in the morning and half in the afternoon.  -refer to nutritionist for further evaluation.  Other fatigue Recommend she increase physical activity at home. Low-impact exercises were reviewed with her.  Advised she increase protein intake as tolerated.  Follow up closely.   Breast cancer of upper-outer quadrant of right female breast (HCC) pT1b N0 M0 stage IA, ER positive PR positive and HER-2 negative. -diagnosed in 05/2014, s/p right lumpectomy and adjuvant radiation. She declined antiestrogens due to cost and concern with side effects.  -most recent mammogram 09/12/21 was negative. -she is overdue for screening mammogram which was ordered 04/2022 -She is clinically stable from a breast standpoint. Physical exam was unremarkable. There is no clinical concern for recurrence. -Continue Surveillance   Plan: -continue  Tagrisso 40 mg daily -  recommend trying to take at night to see if fatigue improves.  -increase protein intake and low-impact physical activity -refer to nutritionist -reviewed labs --decreased PLT of 89 with no evidence of bleeding. Stable WBC, Hgb, and Hct. Glucose 107 with remainder of CMP essentially normal.  -repeat CT chest, abdomen, and pelvis in 3 months.  -repeat CMP, CBC with follow up visit in 3 months.    The patient understands the plans discussed today and is in agreement with them.  She knows to contact our office if she develops concerns prior to her next appointment.  I provided 40 minutes of face-to-face time during this encounter and > 50% was spent counseling as documented under my assessment and plan.    Carlean Jews, NP  [facility] [address] [place] [address] Dept: [phone] Dept Fax: [phone]   Orders Placed This Encounter  Procedures   CT CHEST W CONTRAST    Standing Status:   Future    Standing Expiration Date:   01/07/2024    Order Specific Question:   If indicated for the ordered procedure, I authorize the administration of contrast media per Radiology protocol    Answer:   Yes    Order Specific Question:   Does the patient have a contrast media/X-ray dye allergy?    Answer:   No    Order Specific Question:   Preferred imaging location?    Answer:   [facility]   CT ABDOMEN PELVIS W CONTRAST    Standing Status:   Future    Standing Expiration Date:   01/07/2024    Order Specific Question:   If indicated for the ordered procedure, I authorize the administration of contrast media per Radiology protocol    Answer:   Yes    Order Specific Question:   Does the patient have a contrast media/X-ray dye allergy?    Answer:   No    Order Specific Question:   Preferred imaging location?    Answer:   [facility]    Order Specific Question:   If indicated for  the ordered procedure, I authorize the administration of oral contrast media per Radiology protocol    Answer:   Yes   Ambulatory Referral to [facility]    Referral Priority:   Routine    Referral Type:   Consultation    Referral Reason:   Specialty Services Required    Number of Visits Requested:   1      CHIEF COMPLAINT:  CC: f/u non small cell lung cancer, right breast cancer, ER+  Current Treatment:  Tagrisso 40 mg daily   INTERVAL HISTORY:  December is here today for repeat clinical assessment. She denies fevers or chills. She denies pain. Her appetite is good. Her weight has decreased 3  pounds over last 3 months .  Stage I left lung adenocarcinoma, pT1aN0M0 in 2016, LLL recurrence in 06/2017, LUL recurrence in 10/2020, malignant left pleural effusion in 04/2022 -she continues to take Tagrisso 40 mg daily. Mostly doing well. Reports fatigue which makes it difficult for her to participate in usual activities, especially in the afternoons.  -Restaging CT scan from Oct 04, 2022 showed 1. Stable appearance of small to moderate left pleural effusion with changes secondary to external beam radiation within the posterior left lower lobe. 2. Stable, chronic soft tissue thickening along the suture chain within the left midlung. 3. Previously noted  tracer avid nodule within the right middle lobe appears less solid on today's study measuring 6 mm. Previously this measured 7 mm. This is favored to represent inflammatory/infectious process. Attention on future surveillance imaging advised.4. Other small solid and part solid nodules are unchanged in the interval. 5. Aortic Atherosclerosis (ICD10-I70.0). -will repeat CT Chest, abdomen, and pelvis prior to next visit for surveillance.   I have reviewed the past medical history, past surgical history, social history and family history with the patient and they are unchanged from previous note.  ALLERGIES:  is allergic to beef-derived products, other,  chocolate, ibuprofen, niacin and related, and tramadol.  MEDICATIONS:  Current Outpatient Medications  Medication Sig Dispense Refill   aspirin 81 MG EC tablet      atenolol (TENORMIN) 50 MG tablet Take 50 mg by mouth at bedtime.      b complex vitamins capsule Take 1 capsule by mouth daily.     BINAXNOW COVID-19 AG HOME TEST KIT See admin instructions.     calcium carbonate (OSCAL) 1500 (600 Ca) MG TABS tablet      calcium-vitamin D (OSCAL WITH D) 500-200 MG-UNIT per tablet Take 1 tablet by mouth at bedtime.      Cholecalciferol (VITAMIN D3) 1000 units CAPS Take by mouth daily.     denosumab (PROLIA) 60 MG/ML SOSY injection      Digestive Enzymes (ENZYME DIGEST PO) Take 5 mLs by mouth 3 (three) times daily with meals.     FLUZONE HIGH-DOSE QUADRIVALENT 0.7 ML SUSY      levothyroxine (SYNTHROID, LEVOTHROID) 112 MCG tablet Take 112 mcg by mouth daily before breakfast.      lisinopril (PRINIVIL,ZESTRIL) 10 MG tablet Take 1 tablet (10 mg total) by mouth daily. (Patient taking differently: Take 10 mg by mouth 2 (two) times daily.) 30 tablet 1   magic mouthwash (nystatin, lidocaine, diphenhydrAMINE, alum & mag hydroxide) suspension Swish and swallow 5 mLs by mouth 3 (three) times daily as needed for mouth pain. 140 mL 1   meclizine (ANTIVERT) 12.5 MG tablet Take 12.5 mg by mouth 3 (three) times daily as needed for dizziness.     Multiple Vitamin (MULTIVITAMIN) tablet Take 1 tablet by mouth every morning.      Omega-3 1000 MG CAPS Take 1 g by mouth daily.     osimertinib mesylate (TAGRISSO) 40 MG tablet Take 1 tablet (40 mg total) by mouth daily. 30 tablet 1   Triamcinolone Acetonide (NASACORT AQ NA) Place 1 spray into the nose at bedtime as needed (CONGESTION).     vitamin E 180 MG (400 UNITS) capsule Take 400 Units by mouth every Monday, Wednesday, and Friday. Mon Wed Fri     No current facility-administered medications for this visit.    HISTORY OF PRESENT ILLNESS:   Oncology History  Overview Note   Cancer Staging  Breast cancer of upper-outer quadrant of right female breast [facility]) Staging form: Breast, AJCC 7th Edition - Clinical stage from 05/19/2014: Stage IA (T1b, N0, M0) - Signed by Hubbard Hartshorn, NP on 11/17/2015 - Pathologic stage from 10/17/2014: Stage IA (T1b, N0, cM0) - Signed by Lurline Hare, MD on 10/17/2014  Primary cancer of left lower lobe of lung [facility]) Staging form: Lung, AJCC 7th Edition - Clinical stage from 12/14/2014: Stage IA (T1a, N0, M0) - Unsigned    Breast cancer of upper-outer quadrant of right female breast (HCC)  05/04/2014 Initial Diagnosis   Breast cancer   05/19/2014 Imaging   Diagnostic mammogram and  ultrasound showed a 7 mm irregular suspicious mass located in the right breast at the 10:00 position, 7 cm from the nipple. Otherwise negative.   05/19/2014 Initial Biopsy   Right breast biopsy (UOQ): IDC, grade 2.    05/19/2014 Receptors her2   ER 100% positive, PR 35% positive, HER-2 negative, Ki67 8%   06/07/2014 Miscellaneous   Genetic testing normal.  APC, ATM, BARD1, BMPR1A, BRCA1, BRCA2, BRIP1, CDH1, CDK4, CDKN2A, CHEK2, EPCAM, GREM1, MLH1, MRE11A, MSH2, MSH6, MUTYH, NBN, NF1, PALB2, PMS2, POLD1, POLE, PTEN, RAD50, RAD51D, SMAD4, SMARCA4, STK11, and TP53 tested.    07/23/2014 Surgery   Right breast lumpectomy with SLNB Ezzard Standing) showed invasive ductal carcinoma & DCIS; negative surgical margins.   07/23/2014 Pathology Results   Invasive ductal carcinoma, tumor size 0.8 cm, grade 1, no lymphovascular invasion, margins were negative, 2 sentinel lymph nodes were negative. (+) DCIS.   07/23/2014 Pathologic Stage   pT1b, pN0: Stage IA    09/16/2014 - 10/07/2014 Radiation Therapy   XRT completed Michell Heinrich). Right breast/ 42.72 Gy at 2.67 Gy per fraction x 21 fractions.     Anti-estrogen oral therapy   Aromasin prescribed in 01/2015 but Patient declines anti-estrogen therapy at this time d/t high co-pay of medication and possible  side effects of AI therapy.    05/02/2015 Mammogram   Diagnostic TOMO bilat mammogram: No evidence of malignancy in either breast. Lumpectomy changes in right breast. Diagnostic mammo suggested in 1 year.    Primary cancer of left lower lobe of lung (HCC)  10/25/2013 Imaging   CXR done for stroke protocol. No acute chest findings. Chronic lung disease with biapical scarring. Developing nodule in (L) apex cannot be excluded based on exam, athough may be d/t overlap of osseous structures.    10/27/2013 Imaging   CXR: Nodular opacity in mid (L) apex and appears more prominent than prior study. Area that appeared masslike in LUL near apex on recent CXR not seen at this time.  Underlying emphysema present. No edema or consolidation   11/12/2013 Imaging   CT chest: At left lung apex, there is asymmetric pleural parenchymal scarring. No discrete lesion.  In superior segment of LLL, there are 1 dominant ground-glass opacity measuring 16 mm. 2 addt'l ground-glass opacities appreciated. F/U with CT in 3 months   02/11/2014 Imaging   CT chest: Several small bilat faint nodular densities, largest measures 1.3 cm over superior segment LLL. No new nodules, no adenopathy. Cannot exclude metastatic disease given pt's h/o melanoma. Recommend PET-CT   02/23/2014 PET scan   Persistent semi-solid nodule in superior segment LLL with low metabolic activity. Moderately metabolic subcarinal & hilar LN are likely reactive.    05/25/2014 Imaging   CT chest: Persistent & similar size dominant LLL sub-solid nodule. Adenocarcinoma is considered. Biopsy or resection strongly considered. Additional scattered small ground-glass nodules stable.    06/02/2014 Imaging   MRI brain: Grossly stable appearance of left planum sphenoidale meningioma measuring 20 x 14 mm. Meningioma does not extend to left orbital apex & contacts left optic nerve. Stable right posterior clinoid meningioma .    09/27/2014 Imaging   CT chest: Further  enlargment of central solid component in sub-solid LLL pulm nodule. Findings remain concerning for adenoca. Additional scattered small bilat ground-glass nodules and biapical scarring stable. No adenopathy.     11/25/2014 Surgery   Video bronchoscopy with endobronchial ultrasound; Video assisted thoracoscopy; Thoracoscopic LLL superior segmentectomy; Mediastinal lymph node dissection; Cyro intercostal nerve block Dorris Fetch)   11/25/2014 Pathology  Results   Superior segment lung resection (LLL). Adenocarcinoma, well-diff, spanning 1.5 cm. Surgical margins negative. 5 LNs negative.    11/25/2014 Pathologic Stage   pT1a, pN0: Stage IA    11/25/2014 Initial Diagnosis   Primary cancer of left lower lobe of lung (HCC)   05/31/2015 Imaging   CT chest: No evidence of local recurrent at superior segmentectomy in LLL. New small nodular focus of consolidation surrounding thin parenchymal band. Additional sub-cm ground-glass and solid nodules favored to be benign. No thoracic adenopathy   08/11/2015 Imaging   CT chest: No evidence of residual or recurrent tumor in LLL. Stable small nodular focus in LLL with thin surrounding parenchymal band, favored to be benign.  Multiple small ground-glass nodules unchanged.    06/19/2017 Pathology Results   Diagnosis 1. Lung, biopsy, Left lower lobe - ADENOCARCINOMA. - SEE COMMENT. 2. Lung, biopsy, Left lower lobe - BENIGN LUNG PARENCHYMA. - THERE IS NO EVIDENCE OF MALIGNANCY.   07/09/2017 - 07/22/2017 Radiation Therapy   Radiation treatment dates: 07/09/17-07/22/17 by Dr. Mitzi Hansen    Site/dose:  Left lung/ 50 Gy in 10 fractions   Beams/energy:  IMRT/ 6X   Narrative: The patient tolerated radiation treatment relatively well. She denied pain, fatigue, or difficulty swallowing. Skin to the treatment area appears warm, dry, and normal in color.   06/18/2019 Imaging   CT Chest  IMPRESSION: 1. There are 2 round solid nodules within the left lower lobe which have  increased in size when compared with the previous exam, suspicious. Additionally, along the area of postsurgical change around the oblique fissure of the left lung there is progressive thickening with persistent loculated fluid. Cannot rule out local tumor recurrence along the suture line. 2. No significant change in the appearance of bilateral upper lobe sub solid lung nodules. 3. Aortic Atherosclerosis (ICD10-I70.0). Coronary artery calcifications.     07/07/2019 PET scan   IMPRESSION: 1. No evidence of hypermetabolic local tumor recurrence in the left lung. Low level nonfocal uptake along the segmentectomy site in the left lower lobe favors post treatment change. Continued chest CT surveillance warranted. 2. Two small solid left lower lobe pulmonary nodules, largest 0.7 cm, below PET resolution. Given the growth of these nodules since 10/03/2018 chest CT, metastatic disease remains on the differential. Close chest CT surveillance recommended. 3. Nonspecific hypermetabolism within nonenlarged subcarinal and bilateral hilar lymph nodes, not substantially changed since 03/04/2017 PET-CT study, favoring reactive uptake. 4. No hypermetabolic distant metastatic disease. 5. Chronic findings include: Aortic Atherosclerosis (ICD10-I70.0). Subcentimeter ground-glass pulmonary nodules in the upper lobes are unchanged and are below PET resolution. Moderate sigmoid diverticulosis. Chronic right maxillary sinusitis.   04/13/2020 Imaging   CT Chest  IMPRESSION: 1. Primarily similar appearance of the lungs compared to 10/14/2019. Left sided surgical sutures with similar solid and subsolid pulmonary nodules as detailed above. A right middle lobe pleural based 5 mm nodule is new since the prior exam, warranting follow-up attention. 2. No thoracic adenopathy. 3. Aortic atherosclerosis (ICD10-I70.0), coronary artery atherosclerosis and emphysema (ICD10-J43.9).   10/10/2020 Imaging   CT chest   IMPRESSION: Postsurgical and post radiation changes in the lungs bilaterally, as above.   12 x 10 mm left lower lobe nodule, mildly progressive. Additional bilobed nodule in the posterior left lower lobe, also favored to be progressive. PET-CT is suggested for further evaluation.   Aortic Atherosclerosis (ICD10-I70.0) and Emphysema (ICD10-J43.9).   11/22/2020 - 12/02/2020 Radiation Therapy   Site Technique Total Dose (Gy) Dose per Fx (Gy) Completed  Fx Beam Energies  Lung, Left: Lung_Lt_LLL IMRT 60/60 12 5/5 6XFFF     05/22/2021 Imaging   EXAM: CT CHEST WITHOUT CONTRAST  IMPRESSION: 1. Interval development of a new area of volume loss, ground-glass opacity, and bronchiectasis in the posterior left lower lobe. Imaging features are compatible with radiation therapy. 2. The posterior left lung nodules, including index 12 x 10 mm nodule described previously have become incorporated into the post treatment change on the current study. 3. Interval resolution of the posterior right middle lobe nodularity seen previously. 4. Other scattered areas of consolidative opacity or similar. 5. Trace left pleural effusion. 6. Aortic Atherosclerosis (ICD10-I70.0).       REVIEW OF SYSTEMS:   Constitutional: Denies fevers, chills or night sweats. She reports fatigue.  She has decreased appetite with 3 pound weight loss since she was last seen.  Eyes: Denies blurriness of vision Ears, nose, mouth, throat, and face: Denies mucositis or sore throat Respiratory: Denies cough, dyspnea or wheezes Cardiovascular: Denies palpitation, chest discomfort or lower extremity swelling Gastrointestinal:  Denies nausea, heartburn or change in bowel habits Skin: Denies abnormal skin rashes Lymphatics: Denies new lymphadenopathy or easy bruising Neurological:Denies numbness, tingling or new weaknesses Behavioral/Psych: Mood is stable, no new changes  All other systems were reviewed with the patient and are  negative.   VITALS:   Today's Vitals   01/07/23 1134 01/07/23 1138  BP: (!) 184/77 138/80  Pulse: 72   Resp: 18   Temp: 97.6 F (36.4 C)   TempSrc: Oral   SpO2: 100%   Weight: 108 lb 11.2 oz (49.3 kg)   Height: 5\' 5"  (1.651 m)    Body mass index is 18.09 kg/m.   Wt Readings from Last 3 Encounters:  01/07/23 108 lb 11.2 oz (49.3 kg)  10/08/22 111 lb (50.3 kg)  09/10/22 112 lb 4.8 oz (50.9 kg)    Body mass index is 18.09 kg/m.  Performance status (ECOG): 1 - Symptomatic but completely ambulatory  PHYSICAL EXAM:   GENERAL:alert, no distress and comfortable SKIN: skin color, texture, turgor are normal, no rashes or significant lesions EYES: normal, Conjunctiva are pink and non-injected, sclera clear OROPHARYNX:no exudate, no erythema and lips, buccal mucosa, and tongue normal  NECK: supple, thyroid normal size, non-tender, without nodularity LYMPH:  no palpable lymphadenopathy in the cervical, axillary or inguinal LUNGS: clear to auscultation and percussion with normal breathing effort HEART: regular rate & rhythm and no murmurs and no lower extremity edema ABDOMEN:abdomen soft, non-tender and normal bowel sounds Musculoskeletal:no cyanosis of digits and no clubbing  NEURO: alert & oriented x 3 with fluent speech, no focal motor/sensory deficits  LABORATORY DATA:  I have reviewed the data as listed    Component Value Date/Time   NA 138 01/07/2023 1026   NA 136 (A) 07/09/2017 0000   NA 140 12/23/2014 0833   K 3.9 01/07/2023 1026   K 4.0 12/23/2014 0833   CL 103 01/07/2023 1026   CO2 31 01/07/2023 1026   CO2 29 12/23/2014 0833   GLUCOSE 107 (H) 01/07/2023 1026   GLUCOSE 105 12/23/2014 0833   BUN 18 01/07/2023 1026   BUN 15 07/09/2017 0000   BUN 9.5 12/23/2014 0833   CREATININE 0.76 01/07/2023 1026   CREATININE 0.7 12/23/2014 0833   CALCIUM 9.0 01/07/2023 1026   CALCIUM 9.4 12/23/2014 0833   PROT 6.9 01/07/2023 1026   PROT 6.8 12/23/2014 0833   ALBUMIN 4.2  01/07/2023 1026   ALBUMIN 3.9 12/23/2014 1610  AST 24 01/07/2023 1026   AST 15 12/23/2014 0833   ALT 19 01/07/2023 1026   ALT 14 12/23/2014 0833   ALKPHOS 78 01/07/2023 1026   ALKPHOS 91 12/23/2014 0833   BILITOT 0.6 01/07/2023 1026   BILITOT 0.53 12/23/2014 0833   GFRNONAA >60 01/07/2023 1026   GFRAA >60 10/14/2019 1255     Lab Results  Component Value Date   WBC 3.5 (L) 01/07/2023   NEUTROABS 2.5 01/07/2023   HGB 11.8 (L) 01/07/2023   HCT 35.9 (L) 01/07/2023   MCV 88.6 01/07/2023   PLT 89 (L) 01/07/2023      Chemistry      Component Value Date/Time   NA 138 01/07/2023 1026   NA 136 (A) 07/09/2017 0000   NA 140 12/23/2014 0833   K 3.9 01/07/2023 1026   K 4.0 12/23/2014 0833   CL 103 01/07/2023 1026   CO2 31 01/07/2023 1026   CO2 29 12/23/2014 0833   BUN 18 01/07/2023 1026   BUN 15 07/09/2017 0000   BUN 9.5 12/23/2014 0833   CREATININE 0.76 01/07/2023 1026   CREATININE 0.7 12/23/2014 0833   GLU 121 07/09/2017 0000      Component Value Date/Time   CALCIUM 9.0 01/07/2023 1026   CALCIUM 9.4 12/23/2014 0833   ALKPHOS 78 01/07/2023 1026   ALKPHOS 91 12/23/2014 0833   AST 24 01/07/2023 1026   AST 15 12/23/2014 0833   ALT 19 01/07/2023 1026   ALT 14 12/23/2014 0833   BILITOT 0.6 01/07/2023 1026   BILITOT 0.53 12/23/2014 0833       Plan: -continue Tagrisso 40 mg daily -recommend trying to take at night to see if fatigue improves.  -increase protein intake and low-impact physical activity -refer to nutritionist -reviewed labs --decreased PLT of 89 with no evidence of bleeding. Stable WBC, Hgb, and Hct. Glucose 107 with remainder of CMP essentially normal.  -repeat CT chest, abdomen, and pelvis in 3 months.  -repeat CMP, CBC with follow up visit in 3 months.    Addendum I have seen the patient, examined her. I agree with the assessment and and plan and have edited the notes.   Ms Guglielmi is a clinically doing well overall, she is tolerating Tagrisso well,  with mild fatigue and stable mild pancytopenia.  We discussed what to watch, and the side effect management.  She agrees to continue.  Will see her back in 3 months with repeat lab and a CT scan.  All questions were answered.  Malachy Mood MD 01/07/2023

## 2023-01-07 NOTE — Assessment & Plan Note (Signed)
Patient reports decreased appetite with three pound weight loss since her last visit. Drinking one Boost with extra protein in the mornings, but does not have appetite in afternoon and evening. Recommend she add second Boost in the afternoon, or drink half Boost in the morning and half in the afternoon.  -refer to nutritionist for further evaluation.

## 2023-01-08 ENCOUNTER — Other Ambulatory Visit: Payer: Self-pay

## 2023-01-22 ENCOUNTER — Inpatient Hospital Stay: Payer: Medicare HMO | Admitting: Dietician

## 2023-01-22 NOTE — Progress Notes (Signed)
Nutrition Assessment   Reason for Assessment: wt loss   ASSESSMENT: 89 year old female with recurrent LLL lung cancer. She is currently receiving Tagrisso 40 mg/day.   Met with pt in office. Pt reports weight loss related to tending the garden early in the morning. She is gathering F/V since her husband hurt his back. Reports this is a "good walk" uphill. Pt is drinking a Boost once she is back home. This pushes her breakfast meal off and finds herself not wanting to eat much lunch or dinner. Pt reports visiting a neuropathy doctor secondary to leg pain in the morning. States being instructed to cut out all sugar and wiggle toes prior to getting out of bed. This helped some, however pt has a sweet tooth. Pt reports recently making a peach cobbler due to ripe peaches. She allowed herself a small serving at lunch.   Nutrition Focused Physical Exam:   Orbital Region: moderate Buccal Region: moderate Temple Region: moderate Clavicle Bone Region: severe Shoulder and Acromion Bone Region: severe Scapular Bone Region: moderate Hair: reviewed  Eyes: reviewed  Mouth: reviewed Skin: reviewed - ecchymosis, scattered, BUE Nails: reviewed    Medications: Oscal, D3, synthroid, lisinopril, MMW, antivert, MVI   Labs: reviewed    Anthropometrics: Wts have decreased 8% in the last 6 months - insignificant for time frame however concerning given recurrent lung cancer, advanced age with moderate/severe fat and muscle depletion  Height: 5'5" Weight: 108 lb 11.2 oz (8/5) UBW: 117 lb 11.2 oz (1/17) BMI: 18.09   NUTRITION DIAGNOSIS: Inadequate oral intake related to recurrent lung cancer as evidenced by pt report   INTERVENTION:  Discussed strategies for increasing calories and protein with small frequent meals/snacks - snack ideas + soft moist high protein foods Pt agreeable to bedtime snack Continue drinking Boost HP, recommend 2/day for added calories/protein - suggested drinking one prior to  going out to garden vs drinking once home Contact information provided    MONITORING, EVALUATION, GOAL: Pt will tolerate increased calories and protein to promote wt gain   Next Visit: Tuesday September 10 via telephone

## 2023-01-24 DIAGNOSIS — E039 Hypothyroidism, unspecified: Secondary | ICD-10-CM | POA: Diagnosis not present

## 2023-01-24 DIAGNOSIS — J90 Pleural effusion, not elsewhere classified: Secondary | ICD-10-CM | POA: Diagnosis not present

## 2023-01-24 DIAGNOSIS — R918 Other nonspecific abnormal finding of lung field: Secondary | ICD-10-CM | POA: Diagnosis not present

## 2023-01-24 DIAGNOSIS — D329 Benign neoplasm of meninges, unspecified: Secondary | ICD-10-CM | POA: Diagnosis not present

## 2023-01-24 DIAGNOSIS — M810 Age-related osteoporosis without current pathological fracture: Secondary | ICD-10-CM | POA: Diagnosis not present

## 2023-01-24 DIAGNOSIS — I10 Essential (primary) hypertension: Secondary | ICD-10-CM | POA: Diagnosis not present

## 2023-01-24 DIAGNOSIS — C3490 Malignant neoplasm of unspecified part of unspecified bronchus or lung: Secondary | ICD-10-CM | POA: Diagnosis not present

## 2023-01-24 DIAGNOSIS — E785 Hyperlipidemia, unspecified: Secondary | ICD-10-CM | POA: Diagnosis not present

## 2023-01-24 DIAGNOSIS — M1990 Unspecified osteoarthritis, unspecified site: Secondary | ICD-10-CM | POA: Diagnosis not present

## 2023-01-24 DIAGNOSIS — B353 Tinea pedis: Secondary | ICD-10-CM | POA: Diagnosis not present

## 2023-01-25 ENCOUNTER — Other Ambulatory Visit: Payer: Self-pay | Admitting: Internal Medicine

## 2023-01-25 DIAGNOSIS — C3490 Malignant neoplasm of unspecified part of unspecified bronchus or lung: Secondary | ICD-10-CM

## 2023-01-30 ENCOUNTER — Other Ambulatory Visit (HOSPITAL_COMMUNITY): Payer: Self-pay

## 2023-02-01 ENCOUNTER — Other Ambulatory Visit (HOSPITAL_COMMUNITY): Payer: Self-pay

## 2023-02-08 ENCOUNTER — Other Ambulatory Visit: Payer: Self-pay | Admitting: Hematology

## 2023-02-08 ENCOUNTER — Other Ambulatory Visit (HOSPITAL_COMMUNITY): Payer: Self-pay

## 2023-02-08 ENCOUNTER — Other Ambulatory Visit: Payer: Self-pay

## 2023-02-08 MED ORDER — OSIMERTINIB MESYLATE 40 MG PO TABS
40.0000 mg | ORAL_TABLET | Freq: Every day | ORAL | 1 refills | Status: DC
  Filled 2023-02-08: qty 30, 30d supply, fill #0
  Filled 2023-03-04: qty 30, 30d supply, fill #1

## 2023-02-11 ENCOUNTER — Other Ambulatory Visit (HOSPITAL_COMMUNITY): Payer: Self-pay

## 2023-02-12 ENCOUNTER — Inpatient Hospital Stay: Payer: Medicare HMO | Attending: Hematology | Admitting: Dietician

## 2023-02-12 ENCOUNTER — Telehealth: Payer: Self-pay | Admitting: Dietician

## 2023-02-12 NOTE — Telephone Encounter (Signed)
Nutrition Follow-up:  Patient with recurrent LLL lung cancer. She is currently receiving Tagrisso 40 mg/day.   Patient reports things are about the same. Her appetite is good. Reports she is eating 3 meals day, snacking, and drinking one Boost daily. (Smoothie for breakfast, deer steak, potatoes, biscuit, peach cobbler with ice cream for lunch, left over steak and potatoes for dinner). Pt notes improved energy since taking chemo pill at bedtime vs in the morning. She denies nutrition impact symptoms.   Medications: reviewed   Labs: no new labs for review  Anthropometrics: Pt 108 lb 9 oz on 8/22 (per care everywhere)  8/5 - 108 lb 11.2  NUTRITION DIAGNOSIS: Inadequate oral intake improving    INTERVENTION:  Continue eating high calorie high protein foods  Continue drinking Ensure Plus/equivalent, suggested increasing to 2/day split over 4 servings in between meals     MONITORING, EVALUATION, GOAL: wt trends, intake   NEXT VISIT: Tuesday October 1 via telephone

## 2023-02-22 ENCOUNTER — Encounter (HOSPITAL_BASED_OUTPATIENT_CLINIC_OR_DEPARTMENT_OTHER): Payer: Self-pay | Admitting: Emergency Medicine

## 2023-02-22 ENCOUNTER — Emergency Department (HOSPITAL_BASED_OUTPATIENT_CLINIC_OR_DEPARTMENT_OTHER)
Admission: EM | Admit: 2023-02-22 | Discharge: 2023-02-23 | Disposition: A | Discharge status: Home / Self Care | Payer: Medicare HMO | Attending: Emergency Medicine | Admitting: Emergency Medicine

## 2023-02-22 ENCOUNTER — Other Ambulatory Visit: Payer: Self-pay

## 2023-02-22 DIAGNOSIS — R1084 Generalized abdominal pain: Secondary | ICD-10-CM | POA: Diagnosis not present

## 2023-02-22 DIAGNOSIS — J90 Pleural effusion, not elsewhere classified: Secondary | ICD-10-CM | POA: Diagnosis not present

## 2023-02-22 DIAGNOSIS — R1032 Left lower quadrant pain: Secondary | ICD-10-CM | POA: Diagnosis not present

## 2023-02-22 DIAGNOSIS — R197 Diarrhea, unspecified: Secondary | ICD-10-CM | POA: Insufficient documentation

## 2023-02-22 DIAGNOSIS — Z7982 Long term (current) use of aspirin: Secondary | ICD-10-CM | POA: Insufficient documentation

## 2023-02-22 LAB — COMPREHENSIVE METABOLIC PANEL
ALT: 24 U/L (ref 0–44)
AST: 24 U/L (ref 15–41)
Albumin: 4.3 g/dL (ref 3.5–5.0)
Alkaline Phosphatase: 72 U/L (ref 38–126)
Anion gap: 7 (ref 5–15)
BUN: 23 mg/dL (ref 8–23)
CO2: 28 mmol/L (ref 22–32)
Calcium: 9.4 mg/dL (ref 8.9–10.3)
Chloride: 103 mmol/L (ref 98–111)
Creatinine, Ser: 0.74 mg/dL (ref 0.44–1.00)
GFR, Estimated: >60 mL/min (ref >60)
Glucose, Bld: 103 mg/dL — ABNORMAL HIGH (ref 70–99)
Potassium: 4.2 mmol/L (ref 3.5–5.1)
Sodium: 138 mmol/L (ref 135–145)
Total Bilirubin: 0.6 mg/dL (ref 0.3–1.2)
Total Protein: 7.1 g/dL (ref 6.5–8.1)

## 2023-02-22 LAB — CBC
HCT: 35.5 % — ABNORMAL LOW (ref 36.0–46.0)
Hemoglobin: 11.8 g/dL — ABNORMAL LOW (ref 12.0–15.0)
MCH: 29.6 pg (ref 26.0–34.0)
MCHC: 33.2 g/dL (ref 30.0–36.0)
MCV: 89.2 fL (ref 80.0–100.0)
Platelets: 91 10*3/uL — ABNORMAL LOW (ref 150–400)
RBC: 3.98 MIL/uL (ref 3.87–5.11)
RDW: 14.1 % (ref 11.5–15.5)
WBC: 5.1 10*3/uL (ref 4.0–10.5)
nRBC: 0 % (ref 0.0–0.2)

## 2023-02-22 LAB — URINALYSIS, ROUTINE W REFLEX MICROSCOPIC
Bacteria, UA: NONE SEEN
Bilirubin Urine: NEGATIVE
Glucose, UA: NEGATIVE mg/dL
Ketones, ur: NEGATIVE mg/dL
Nitrite: NEGATIVE
Protein, ur: NEGATIVE mg/dL
Specific Gravity, Urine: 1.009 (ref 1.005–1.030)
pH: 5 (ref 5.0–8.0)

## 2023-02-22 LAB — LIPASE, BLOOD: Lipase: 31 U/L (ref 11–51)

## 2023-02-22 NOTE — ED Triage Notes (Signed)
Pt reports about 1800 tonight she had lower abdominal pain, sudden onset, that was severe in nature.  Now it is not hurting like it was but "dont feel right"  No n/v/ but has had diarrhea today.  No fever or chills.

## 2023-02-23 ENCOUNTER — Emergency Department (HOSPITAL_BASED_OUTPATIENT_CLINIC_OR_DEPARTMENT_OTHER): Payer: Medicare HMO

## 2023-02-23 DIAGNOSIS — K5730 Diverticulosis of large intestine without perforation or abscess without bleeding: Secondary | ICD-10-CM | POA: Diagnosis not present

## 2023-02-23 DIAGNOSIS — R1032 Left lower quadrant pain: Secondary | ICD-10-CM | POA: Diagnosis not present

## 2023-02-23 MED ORDER — IOHEXOL 300 MG/ML  SOLN
100.0000 mL | Freq: Once | INTRAMUSCULAR | Status: AC | PRN
  Administered 2023-02-23: 100 mL via INTRAVENOUS

## 2023-02-23 NOTE — ED Provider Notes (Signed)
[facility] AT [facility]  Provider Note  CSN: [id number] Arrival date & time: 02/22/23 2002  History Chief Complaint  Patient presents with   Abdominal Pain    Melissa Snyder is a 89 y.o. female with prior history of diverticulitis reports she had some diarrhea earlier in the day of arrival which resolved by lunchtime. Around 1800hrs she began having diffuse lower abdominal pain, similar to previous diverticulitis. No fever. No vomiting. No further diarrhea. No urinary symptoms. Pain has improved some.    Home Medications Prior to Admission medications   Medication Sig Start Date End Date Taking? Authorizing Provider  aspirin 81 MG EC tablet  05/09/09   [provider]  atenolol (TENORMIN) 50 MG tablet Take 50 mg by mouth at bedtime.     [provider]  b complex vitamins capsule Take 1 capsule by mouth daily.    [provider]  Sherrie Sport COVID-19 AG HOME TEST KIT See admin instructions. 02/13/21   [provider]  calcium carbonate (OSCAL) 1500 (600 Ca) MG TABS tablet  04/25/11   [provider]  calcium-vitamin D (OSCAL WITH D) 500-200 MG-UNIT per tablet Take 1 tablet by mouth at bedtime.     [provider]  Cholecalciferol (VITAMIN D3) 1000 units CAPS Take by mouth daily.    [provider]  denosumab (PROLIA) 60 MG/ML SOSY injection  11/03/15   [provider]  Digestive Enzymes (ENZYME DIGEST PO) Take 5 mLs by mouth 3 (three) times daily with meals.    [provider]  FLUZONE HIGH-DOSE QUADRIVALENT 0.7 ML SUSY  03/02/21   [provider]  levothyroxine (SYNTHROID, LEVOTHROID) 112 MCG tablet Take 112 mcg by mouth daily before breakfast.     [provider]  lisinopril (PRINIVIL,ZESTRIL) 10 MG tablet Take 1 tablet (10 mg total) by mouth daily. Patient taking differently: Take 10 mg by mouth 2 (two) times daily. 11/29/14   Rowe Clack, PA-C  magic  mouthwash (nystatin, lidocaine, diphenhydrAMINE, alum & mag hydroxide) suspension Swish and swallow 5 mLs by mouth 3 (three) times daily as needed for mouth pain. 08/10/22   Malachy Mood, MD  meclizine (ANTIVERT) 12.5 MG tablet Take 12.5 mg by mouth 3 (three) times daily as needed for dizziness.    [provider]  Multiple Vitamin (MULTIVITAMIN) tablet Take 1 tablet by mouth every morning.     [provider]  Omega-3 1000 MG CAPS Take 1 g by mouth daily.    [provider]  osimertinib mesylate (TAGRISSO) 40 MG tablet Take 1 tablet (40 mg total) by mouth daily. 02/08/23   Malachy Mood, MD  Triamcinolone Acetonide (NASACORT AQ NA) Place 1 spray into the nose at bedtime as needed (CONGESTION).    [provider]  vitamin E 180 MG (400 UNITS) capsule Take 400 Units by mouth every Monday, Wednesday, and Friday. Mon Wed Fri    [provider]     Allergies    Beef-derived products, Other, Chocolate, Ibuprofen, Niacin and related, and Tramadol   Review of Systems   Review of Systems Please see HPI for pertinent positives and negatives  Physical Exam BP (!) 164/73   Pulse 74   Resp 18   SpO2 98%   Physical Exam Vitals and nursing note reviewed.  Constitutional:      Appearance: Normal appearance.  HENT:     Head: Normocephalic and atraumatic.     Nose: Nose normal.  Mouth/Throat:     Mouth: Mucous membranes are moist.  Eyes:     Extraocular Movements: Extraocular movements intact.     Conjunctiva/sclera: Conjunctivae normal.  Cardiovascular:     Rate and Rhythm: Normal rate.  Pulmonary:     Effort: Pulmonary effort is normal.     Breath sounds: Normal breath sounds.  Abdominal:     General: Abdomen is flat.     Palpations: Abdomen is soft.     Tenderness: There is abdominal tenderness in the left lower quadrant. There is no guarding. Negative signs include Murphy's sign and McBurney's sign.  Musculoskeletal:        General: No swelling.  Normal range of motion.     Cervical back: Neck supple.  Skin:    General: Skin is warm and dry.  Neurological:     General: No focal deficit present.     Mental Status: She is alert.  Psychiatric:        Mood and Affect: Mood normal.     ED Results / Procedures / Treatments   EKG None  Procedures Procedures  Medications Ordered in the ED Medications  iohexol (OMNIPAQUE) 300 MG/ML solution 100 mL (100 mLs Intravenous Contrast Given 02/23/23 0050)    Initial Impression and Plan  Patient here with LLQ abdominal pain, some diarrhea earlier in the day has resolved. Labs done in triage show an unremarkable  CBC, CMP. Lipase and UA. Will send for CT. She declines pain medication.   ED Course   Clinical Course as of 02/23/23 0205  Sat Feb 23, 2023  0203 I personally viewed the images from radiology studies and agree with radiologist interpretation: CT is negative for acute intraabdominal process. She is aware of pleural effusion, has had similar before requiring drainage but currently asymptomatic. Recommend she follow up with Oncology if she has any trouble breathing. She has a benign abdomen. No clear cause of her pain is found but she is feeling better and is eager to go home. PCP follow up, RTED for any other concerns.   [CS]    Clinical Course User Index [CS] Pollyann Savoy, MD     MDM Rules/Calculators/A&P Medical Decision Making Given presenting complaint, I considered that admission might be necessary. After review of results from ED lab and/or imaging studies, admission to the hospital is not indicated at this time.    Problems Addressed: Diarrhea, unspecified type: acute illness or injury Left lower quadrant abdominal pain: acute illness or injury Pleural effusion: chronic illness or injury  Amount and/or Complexity of Data Reviewed Labs: ordered. Decision-making details documented in ED Course. Radiology: ordered and independent interpretation performed.  Decision-making details documented in ED Course.  Risk Prescription drug management. Decision regarding hospitalization.     Final Clinical Impression(s) / ED Diagnoses Final diagnoses:  Left lower quadrant abdominal pain  Diarrhea, unspecified type  Pleural effusion    Rx / DC Orders ED Discharge Orders     None        Pollyann Savoy, MD 02/23/23 [phone]

## 2023-02-25 ENCOUNTER — Other Ambulatory Visit: Payer: Self-pay

## 2023-02-25 ENCOUNTER — Telehealth: Payer: Self-pay

## 2023-02-25 DIAGNOSIS — C3432 Malignant neoplasm of lower lobe, left bronchus or lung: Secondary | ICD-10-CM

## 2023-02-25 DIAGNOSIS — R0602 Shortness of breath: Secondary | ICD-10-CM

## 2023-02-25 NOTE — Telephone Encounter (Signed)
Spoke with pt via telephone regarding SOB and increased fatigue.  Pt stated while in the hospital on her recent visit she received a thoracentesis as well as a CT AP w/contrast which showed increased pleural effusion.  Pt stated she thinks she needs another thoracentesis.  Placed pt on hold.  Spoke w/Dr. Mosetta Putt regarding pt's symptoms.  Verbal orders w/readback given by Dr. Mosetta Putt for IR Thoracentesis (standing orders) and CT Chest wo contrast in next 2 wks, and OV w/Dr. Mosetta Putt once CT Chest is completed.  Orders for thoracentesis & CT Chest wo contrast placed.  Informed pt of the plan for thoracentesis, CT Chest, and f/u visit w/Dr. Mosetta Putt post CT Scan.  Informed pt that she is scheduled for a thoracentesis w/[facility] on 02/26/2023 at arrival time 9:30am w/procedure time 10 am.  Stated that once pt's insurance approves CT Scan, Peggy from [employer] will call pt to get her scheduled for her CT Scan of her chest.  Stated Dr. Latanya Maudlin scheduler Erie Noe will give pt a call to get her scheduled 2 to 5 days after the CT Scan is completed to go over the results and discuss the pt's plan of care.  Pt verbalized understanding and had no further questions at this time.

## 2023-02-25 NOTE — Telephone Encounter (Signed)
Pt LVM stating that she has had "changes in breathing" and feels that she needs "fluid drawn from her lungs."  Pt stated that she is a pt of Dr. Mosetta Putt. Message sent to Dr. Latanya Maudlin nurse, Olegario Shearer, RN, who stated that she would call pt regarding this message.

## 2023-02-26 ENCOUNTER — Ambulatory Visit (HOSPITAL_COMMUNITY): Payer: Medicare HMO

## 2023-02-27 ENCOUNTER — Ambulatory Visit (HOSPITAL_COMMUNITY)
Admission: RE | Admit: 2023-02-27 | Discharge: 2023-02-27 | Disposition: A | Discharge status: Home / Self Care | Payer: Medicare HMO | Source: Ambulatory Visit | Attending: Hematology | Admitting: Hematology

## 2023-02-27 ENCOUNTER — Other Ambulatory Visit: Payer: Self-pay

## 2023-02-27 ENCOUNTER — Ambulatory Visit (HOSPITAL_COMMUNITY)
Admission: RE | Admit: 2023-02-27 | Discharge: 2023-02-27 | Disposition: A | Discharge status: Home / Self Care | Payer: Medicare HMO | Source: Ambulatory Visit | Attending: Interventional Radiology | Admitting: Interventional Radiology

## 2023-02-27 VITALS — BP 142/59

## 2023-02-27 DIAGNOSIS — R0602 Shortness of breath: Secondary | ICD-10-CM | POA: Diagnosis not present

## 2023-02-27 DIAGNOSIS — C3490 Malignant neoplasm of unspecified part of unspecified bronchus or lung: Secondary | ICD-10-CM | POA: Diagnosis not present

## 2023-02-27 DIAGNOSIS — Z48813 Encounter for surgical aftercare following surgery on the respiratory system: Secondary | ICD-10-CM | POA: Diagnosis not present

## 2023-02-27 DIAGNOSIS — C3432 Malignant neoplasm of lower lobe, left bronchus or lung: Secondary | ICD-10-CM | POA: Insufficient documentation

## 2023-02-27 DIAGNOSIS — J90 Pleural effusion, not elsewhere classified: Secondary | ICD-10-CM | POA: Insufficient documentation

## 2023-02-27 HISTORY — PX: IR THORACENTESIS ASP PLEURAL SPACE W/IMG GUIDE: IMG5380

## 2023-02-27 MED ORDER — LIDOCAINE HCL 1 % IJ SOLN
20.0000 mL | Freq: Once | INTRAMUSCULAR | Status: AC
  Administered 2023-02-27: 10 mL via INTRADERMAL

## 2023-02-27 MED ORDER — LIDOCAINE HCL 1 % IJ SOLN: 20.0000 mL | Freq: Once | INTRAMUSCULAR | Status: DC

## 2023-02-27 MED ORDER — LIDOCAINE HCL 1 % IJ SOLN
INTRAMUSCULAR | Status: AC
  Filled 2023-02-27: qty 20

## 2023-02-27 NOTE — Procedures (Signed)
PROCEDURE SUMMARY:  Successful US guided left thoracentesis. Yielded 650 ml of clear yellow fluid. Pt tolerated procedure well. No immediate complications.  Specimen not sent for labs. CXR ordered; no post-procedure pneumothorax identified.   EBL < 2 mL  Mickie Kay, NP 02/27/2023 2:57 PM

## 2023-03-04 ENCOUNTER — Other Ambulatory Visit: Payer: Self-pay

## 2023-03-04 ENCOUNTER — Telehealth: Payer: Self-pay | Admitting: Hematology

## 2023-03-04 NOTE — Progress Notes (Signed)
Specialty Pharmacy Refill Coordination Note  Melissa Snyder is a 89 y.o. female contacted today regarding refills of specialty medication(s) Osimertinib Mesylate .  Patient requested Delivery  on 03/12/23  to verified address [address] [place] [address]   Medication will be filled on 03/11/23.

## 2023-03-05 ENCOUNTER — Inpatient Hospital Stay: Payer: Medicare HMO | Attending: Hematology | Admitting: Dietician

## 2023-03-05 ENCOUNTER — Telehealth: Payer: Self-pay | Admitting: Dietician

## 2023-03-05 ENCOUNTER — Telehealth: Payer: Self-pay

## 2023-03-05 DIAGNOSIS — C3432 Malignant neoplasm of lower lobe, left bronchus or lung: Secondary | ICD-10-CM | POA: Insufficient documentation

## 2023-03-05 DIAGNOSIS — Z853 Personal history of malignant neoplasm of breast: Secondary | ICD-10-CM | POA: Insufficient documentation

## 2023-03-05 DIAGNOSIS — D61818 Other pancytopenia: Secondary | ICD-10-CM | POA: Insufficient documentation

## 2023-03-05 DIAGNOSIS — Z79899 Other long term (current) drug therapy: Secondary | ICD-10-CM | POA: Insufficient documentation

## 2023-03-05 NOTE — Telephone Encounter (Signed)
Spoke w/pt regarding Secure Chat message this nurse received from Edwena Felty, RD (see below).  Hi Kal[place] Jaquith! Could you reach out to Mrs. Urbanski today? She reports lingering LL side soreness with inhalation s/p 9/21 thora. Pt states this only occurs when she sits for >/= 30 minutes (interfering w/ working puzzles). Improves once she is up and moving around. She is asking if this is typical after procedure. Does not recall this occurring after her first one.   Spoke with pt regarding message received.  Pt stated she's having pain in her lft lower lung area when sitting for long periods of time.  Pt stated when she's up and moving around she does not have pain.  Pt denied SOB at rest or on exertion at this time.  Pt does not have a Pulse Ox so she's unable to measure her oxygenation.  Recommended that pt purchases a Pulse Ox to measure her oxygenation especially since she's requiring thoracentesis.  Pt denied taking pain medication or applying heat to the area.  Recommended that the pt apply heat to the area and take Acetaminophen for the pain.  Encouraged the pt to get up and move around more since the pt does not find discomfort with up moving around.  Pt verbalized understanding.  This nurse informed Dr. Mosetta Putt of the pt's symptoms.

## 2023-03-05 NOTE — Telephone Encounter (Addendum)
Nutrition Follow-up:  Patient with recurrent LLL lung cancer. She is currently receiving Tagrisso 40 mg/day.   9/20 - ED for LLQ pain 9/21 - thoracentesis yield 650 ml  Spoke with pt via telephone for nutrition follow-up. She reports breathing has improved s/p thoracentesis. Pt denies SOB. She reports lingering soreness with inhalation on lower left side when sitting for ~30 minutes. Soreness subsides once up and moving around for a while. Patient asking what the cause of this could be.   Patient reports appetite is fair. She does not get hungry or look forward to meal times as she once did. Patient is pushing herself to eat 3 meals. She is supplementing with 1-2 Boost. Recalls 10 oz smoothie for breakfast, bowl of oatmeal with pecans/raisins with 3/4 Boost for snack. Had tenderloin biscuit with 1/2 Boost for lunch. Pt had a vanilla milkshake yesterday afternoon. Reports grilled tenders, oct beans, corn, slaw, cornbread for supper. Pt says she is drinking plenty of water. She denies nausea, vomiting, diarrhea, constipation.    Medications: reviewed   Labs: 9/20 labs reviewed   Anthropometrics: No new wts   NUTRITION DIAGNOSIS: Inadequate oral intake appears stable per dietary recall   INTERVENTION:  Encouraged high calorie high protein foods in soft moist textures  Continue drinking Boost, recommend 2/day in between meals Message sent to Dr. Latanya Maudlin nurse     MONITORING, EVALUATION, GOAL: wt trends, intake   NEXT VISIT: Tuesday November 5 via telephone

## 2023-03-10 ENCOUNTER — Ambulatory Visit
Admission: RE | Admit: 2023-03-10 | Discharge: 2023-03-10 | Disposition: A | Discharge status: Home / Self Care | Payer: Medicare HMO | Source: Ambulatory Visit | Attending: Internal Medicine | Admitting: Internal Medicine

## 2023-03-10 DIAGNOSIS — C3490 Malignant neoplasm of unspecified part of unspecified bronchus or lung: Secondary | ICD-10-CM

## 2023-03-10 MED ORDER — GADOPICLENOL 0.5 MMOL/ML IV SOLN
5.0000 mL | Freq: Once | INTRAVENOUS | Status: AC | PRN
  Administered 2023-03-10: 5 mL via INTRAVENOUS

## 2023-03-11 ENCOUNTER — Ambulatory Visit (HOSPITAL_COMMUNITY)
Admission: RE | Admit: 2023-03-11 | Discharge: 2023-03-11 | Disposition: A | Discharge status: Home / Self Care | Payer: Medicare HMO | Source: Ambulatory Visit | Attending: Hematology | Admitting: Hematology

## 2023-03-11 DIAGNOSIS — R0602 Shortness of breath: Secondary | ICD-10-CM | POA: Diagnosis not present

## 2023-03-11 DIAGNOSIS — C3432 Malignant neoplasm of lower lobe, left bronchus or lung: Secondary | ICD-10-CM | POA: Diagnosis not present

## 2023-03-11 DIAGNOSIS — J9811 Atelectasis: Secondary | ICD-10-CM | POA: Diagnosis not present

## 2023-03-11 DIAGNOSIS — J90 Pleural effusion, not elsewhere classified: Secondary | ICD-10-CM | POA: Diagnosis not present

## 2023-03-11 DIAGNOSIS — C50919 Malignant neoplasm of unspecified site of unspecified female breast: Secondary | ICD-10-CM | POA: Diagnosis not present

## 2023-03-20 ENCOUNTER — Inpatient Hospital Stay: Payer: Medicare HMO | Admitting: Nurse Practitioner

## 2023-03-20 ENCOUNTER — Encounter: Payer: Self-pay | Admitting: Nurse Practitioner

## 2023-03-20 ENCOUNTER — Inpatient Hospital Stay: Payer: Medicare HMO

## 2023-03-20 VITALS — BP 156/75 | HR 77 | Temp 98.1°F | Resp 18 | Ht 65.0 in | Wt 109.0 lb

## 2023-03-20 DIAGNOSIS — C3432 Malignant neoplasm of lower lobe, left bronchus or lung: Secondary | ICD-10-CM

## 2023-03-20 DIAGNOSIS — C50411 Malignant neoplasm of upper-outer quadrant of right female breast: Secondary | ICD-10-CM | POA: Diagnosis not present

## 2023-03-20 DIAGNOSIS — Z853 Personal history of malignant neoplasm of breast: Secondary | ICD-10-CM | POA: Diagnosis not present

## 2023-03-20 DIAGNOSIS — D61818 Other pancytopenia: Secondary | ICD-10-CM | POA: Diagnosis not present

## 2023-03-20 DIAGNOSIS — Z79899 Other long term (current) drug therapy: Secondary | ICD-10-CM | POA: Diagnosis not present

## 2023-03-20 DIAGNOSIS — Z170 Estrogen receptor positive status [ER+]: Secondary | ICD-10-CM | POA: Diagnosis not present

## 2023-03-20 LAB — CBC WITH DIFFERENTIAL (CANCER CENTER ONLY)
Abs Immature Granulocytes: 0.01 10*3/uL (ref 0.00–0.07)
Basophils Absolute: 0 10*3/uL (ref 0.0–0.1)
Basophils Relative: 1 %
Eosinophils Absolute: 0.1 10*3/uL (ref 0.0–0.5)
Eosinophils Relative: 3 %
HCT: 34.8 % — ABNORMAL LOW (ref 36.0–46.0)
Hemoglobin: 11.5 g/dL — ABNORMAL LOW (ref 12.0–15.0)
Immature Granulocytes: 0 %
Lymphocytes Relative: 15 %
Lymphs Abs: 0.5 10*3/uL — ABNORMAL LOW (ref 0.7–4.0)
MCH: 29.6 pg (ref 26.0–34.0)
MCHC: 33 g/dL (ref 30.0–36.0)
MCV: 89.7 fL (ref 80.0–100.0)
Monocytes Absolute: 0.4 10*3/uL (ref 0.1–1.0)
Monocytes Relative: 10 %
Neutro Abs: 2.5 10*3/uL (ref 1.7–7.7)
Neutrophils Relative %: 71 %
Platelet Count: 86 10*3/uL — ABNORMAL LOW (ref 150–400)
RBC: 3.88 MIL/uL (ref 3.87–5.11)
RDW: 13.9 % (ref 11.5–15.5)
WBC Count: 3.4 10*3/uL — ABNORMAL LOW (ref 4.0–10.5)
nRBC: 0 % (ref 0.0–0.2)

## 2023-03-20 LAB — CMP (CANCER CENTER ONLY)
ALT: 28 U/L (ref 0–44)
AST: 29 U/L (ref 15–41)
Albumin: 3.9 g/dL (ref 3.5–5.0)
Alkaline Phosphatase: 66 U/L (ref 38–126)
Anion gap: 9 (ref 5–15)
BUN: 22 mg/dL (ref 8–23)
CO2: 28 mmol/L (ref 22–32)
Calcium: 8.9 mg/dL (ref 8.9–10.3)
Chloride: 101 mmol/L (ref 98–111)
Creatinine: 0.74 mg/dL (ref 0.44–1.00)
GFR, Estimated: >60 mL/min (ref >60)
Glucose, Bld: 135 mg/dL — ABNORMAL HIGH (ref 70–99)
Potassium: 4.2 mmol/L (ref 3.5–5.1)
Sodium: 138 mmol/L (ref 135–145)
Total Bilirubin: 0.6 mg/dL (ref 0.3–1.2)
Total Protein: 6.7 g/dL (ref 6.5–8.1)

## 2023-03-20 NOTE — Progress Notes (Signed)
Patient Care Team: Rodrigo Ran, MD as PCP - General (Internal Medicine) Loreli Slot, MD as Consulting Physician (Cardiothoracic Surgery) Alfredo Martinez, MD as Consulting Physician (Urology) Malachy Mood, MD as Consulting Physician (Hematology) Lurline Hare, MD as Consulting Physician (Radiation Oncology) Ronny Bacon, PA-C as Physician Assistant (Radiation Oncology) Imaging, [facility] as Consulting Physician (Diagnostic Radiology)   CHIEF COMPLAINT: Follow-up lung and history of breast cancer  Oncology History Overview Note   Cancer Staging  Breast cancer of upper-outer quadrant of right female breast [facility]) Staging form: Breast, AJCC 7th Edition - Clinical stage from 05/19/2014: Stage IA (T1b, N0, M0) - Signed by Hubbard Hartshorn, NP on 11/17/2015 - Pathologic stage from 10/17/2014: Stage IA (T1b, N0, cM0) - Signed by Lurline Hare, MD on 10/17/2014  Primary cancer of left lower lobe of lung [facility]) Staging form: Lung, AJCC 7th Edition - Clinical stage from 12/14/2014: Stage IA (T1a, N0, M0) - Unsigned    Breast cancer of upper-outer quadrant of right female breast (HCC)  05/04/2014 Initial Diagnosis   Breast cancer   05/19/2014 Imaging   Diagnostic mammogram and ultrasound showed a 7 mm irregular suspicious mass located in the right breast at the 10:00 position, 7 cm from the nipple. Otherwise negative.   05/19/2014 Initial Biopsy   Right breast biopsy (UOQ): IDC, grade 2.    05/19/2014 Receptors her2   ER 100% positive, PR 35% positive, HER-2 negative, Ki67 8%   06/07/2014 Miscellaneous   Genetic testing normal.  APC, ATM, BARD1, BMPR1A, BRCA1, BRCA2, BRIP1, CDH1, CDK4, CDKN2A, CHEK2, EPCAM, GREM1, MLH1, MRE11A, MSH2, MSH6, MUTYH, NBN, NF1, PALB2, PMS2, POLD1, POLE, PTEN, RAD50, RAD51D, SMAD4, SMARCA4, STK11, and TP53 tested.    07/23/2014 Surgery   Right breast lumpectomy with SLNB Ezzard Standing) showed invasive ductal carcinoma &  DCIS; negative surgical margins.   07/23/2014 Pathology Results   Invasive ductal carcinoma, tumor size 0.8 cm, grade 1, no lymphovascular invasion, margins were negative, 2 sentinel lymph nodes were negative. (+) DCIS.   07/23/2014 Pathologic Stage   pT1b, pN0: Stage IA    09/16/2014 - 10/07/2014 Radiation Therapy   XRT completed Michell Heinrich). Right breast/ 42.72 Gy at 2.67 Gy per fraction x 21 fractions.     Anti-estrogen oral therapy   Aromasin prescribed in 01/2015 but Patient declines anti-estrogen therapy at this time d/t high co-pay of medication and possible side effects of AI therapy.    05/02/2015 Mammogram   Diagnostic TOMO bilat mammogram: No evidence of malignancy in either breast. Lumpectomy changes in right breast. Diagnostic mammo suggested in 1 year.    Primary cancer of left lower lobe of lung (HCC)  10/25/2013 Imaging   CXR done for stroke protocol. No acute chest findings. Chronic lung disease with biapical scarring. Developing nodule in (L) apex cannot be excluded based on exam, athough may be d/t overlap of osseous structures.    10/27/2013 Imaging   CXR: Nodular opacity in mid (L) apex and appears more prominent than prior study. Area that appeared masslike in LUL near apex on recent CXR not seen at this time.  Underlying emphysema present. No edema or consolidation   11/12/2013 Imaging   CT chest: At left lung apex, there is asymmetric pleural parenchymal scarring. No discrete lesion.  In superior segment of LLL, there are 1 dominant ground-glass opacity measuring 16 mm. 2 addt'l ground-glass opacities appreciated. F/U with CT in 3 months   02/11/2014 Imaging   CT chest: Several small  bilat faint nodular densities, largest measures 1.3 cm over superior segment LLL. No new nodules, no adenopathy. Cannot exclude metastatic disease given pt's h/o melanoma. Recommend PET-CT   02/23/2014 PET scan   Persistent semi-solid nodule in superior segment LLL with low metabolic activity.  Moderately metabolic subcarinal & hilar LN are likely reactive.    05/25/2014 Imaging   CT chest: Persistent & similar size dominant LLL sub-solid nodule. Adenocarcinoma is considered. Biopsy or resection strongly considered. Additional scattered small ground-glass nodules stable.    06/02/2014 Imaging   MRI brain: Grossly stable appearance of left planum sphenoidale meningioma measuring 20 x 14 mm. Meningioma does not extend to left orbital apex & contacts left optic nerve. Stable right posterior clinoid meningioma .    09/27/2014 Imaging   CT chest: Further enlargment of central solid component in sub-solid LLL pulm nodule. Findings remain concerning for adenoca. Additional scattered small bilat ground-glass nodules and biapical scarring stable. No adenopathy.     11/25/2014 Surgery   Video bronchoscopy with endobronchial ultrasound; Video assisted thoracoscopy; Thoracoscopic LLL superior segmentectomy; Mediastinal lymph node dissection; Cyro intercostal nerve block Dorris Fetch)   11/25/2014 Pathology Results   Superior segment lung resection (LLL). Adenocarcinoma, well-diff, spanning 1.5 cm. Surgical margins negative. 5 LNs negative.    11/25/2014 Pathologic Stage   pT1a, pN0: Stage IA    11/25/2014 Initial Diagnosis   Primary cancer of left lower lobe of lung (HCC)   05/31/2015 Imaging   CT chest: No evidence of local recurrent at superior segmentectomy in LLL. New small nodular focus of consolidation surrounding thin parenchymal band. Additional sub-cm ground-glass and solid nodules favored to be benign. No thoracic adenopathy   08/11/2015 Imaging   CT chest: No evidence of residual or recurrent tumor in LLL. Stable small nodular focus in LLL with thin surrounding parenchymal band, favored to be benign.  Multiple small ground-glass nodules unchanged.    06/19/2017 Pathology Results   Diagnosis 1. Lung, biopsy, Left lower lobe - ADENOCARCINOMA. - SEE COMMENT. 2. Lung, biopsy, Left  lower lobe - BENIGN LUNG PARENCHYMA. - THERE IS NO EVIDENCE OF MALIGNANCY.   07/09/2017 - 07/22/2017 Radiation Therapy   Radiation treatment dates: 07/09/17-07/22/17 by Dr. Mitzi Hansen    Site/dose:  Left lung/ 50 Gy in 10 fractions   Beams/energy:  IMRT/ 6X   Narrative: The patient tolerated radiation treatment relatively well. She denied pain, fatigue, or difficulty swallowing. Skin to the treatment area appears warm, dry, and normal in color.   06/18/2019 Imaging   CT Chest  IMPRESSION: 1. There are 2 round solid nodules within the left lower lobe which have increased in size when compared with the previous exam, suspicious. Additionally, along the area of postsurgical change around the oblique fissure of the left lung there is progressive thickening with persistent loculated fluid. Cannot rule out local tumor recurrence along the suture line. 2. No significant change in the appearance of bilateral upper lobe sub solid lung nodules. 3. Aortic Atherosclerosis (ICD10-I70.0). Coronary artery calcifications.     07/07/2019 PET scan   IMPRESSION: 1. No evidence of hypermetabolic local tumor recurrence in the left lung. Low level nonfocal uptake along the segmentectomy site in the left lower lobe favors post treatment change. Continued chest CT surveillance warranted. 2. Two small solid left lower lobe pulmonary nodules, largest 0.7 cm, below PET resolution. Given the growth of these nodules since 10/03/2018 chest CT, metastatic disease remains on the differential. Close chest CT surveillance recommended. 3. Nonspecific hypermetabolism within nonenlarged subcarinal  and bilateral hilar lymph nodes, not substantially changed since 03/04/2017 PET-CT study, favoring reactive uptake. 4. No hypermetabolic distant metastatic disease. 5. Chronic findings include: Aortic Atherosclerosis (ICD10-I70.0). Subcentimeter ground-glass pulmonary nodules in the upper lobes are unchanged and are below PET  resolution. Moderate sigmoid diverticulosis. Chronic right maxillary sinusitis.   04/13/2020 Imaging   CT Chest  IMPRESSION: 1. Primarily similar appearance of the lungs compared to 10/14/2019. Left sided surgical sutures with similar solid and subsolid pulmonary nodules as detailed above. A right middle lobe pleural based 5 mm nodule is new since the prior exam, warranting follow-up attention. 2. No thoracic adenopathy. 3. Aortic atherosclerosis (ICD10-I70.0), coronary artery atherosclerosis and emphysema (ICD10-J43.9).   10/10/2020 Imaging   CT chest  IMPRESSION: Postsurgical and post radiation changes in the lungs bilaterally, as above.   12 x 10 mm left lower lobe nodule, mildly progressive. Additional bilobed nodule in the posterior left lower lobe, also favored to be progressive. PET-CT is suggested for further evaluation.   Aortic Atherosclerosis (ICD10-I70.0) and Emphysema (ICD10-J43.9).   11/22/2020 - 12/02/2020 Radiation Therapy   Site Technique Total Dose (Gy) Dose per Fx (Gy) Completed Fx Beam Energies  Lung, Left: Lung_Lt_LLL IMRT 60/60 12 5/5 6XFFF     05/22/2021 Imaging   EXAM: CT CHEST WITHOUT CONTRAST  IMPRESSION: 1. Interval development of a new area of volume loss, ground-glass opacity, and bronchiectasis in the posterior left lower lobe. Imaging features are compatible with radiation therapy. 2. The posterior left lung nodules, including index 12 x 10 mm nodule described previously have become incorporated into the post treatment change on the current study. 3. Interval resolution of the posterior right middle lobe nodularity seen previously. 4. Other scattered areas of consolidative opacity or similar. 5. Trace left pleural effusion. 6. Aortic Atherosclerosis (ICD10-I70.0).      CURRENT THERAPY: Tagrisso 40 mg daily  INTERVAL HISTORY Ms. Begue returns for follow-up as scheduled, last seen by my colleague Vincent Gros, NP 01/07/2023.  Doing well  overall.  She required a thoracentesis 9/25, dyspnea improved but she is still coughing.  Denies fever, chills, chest pain.  Tolerating Tagrisso, fatigue improved with switching to evening dosing.  She is eating and drinking, denies n/v/c/d.   ROS  All other systems reviewed and negative  Past Medical History:  Diagnosis Date   Adenocarcinoma (HCC)    recurrent/notes 07/03/2017   Blind left eye 10/2013   cva   Breast cancer (HCC) 05/2014   ER+/PR+/Her2-     LUNG CANCER   Bundle branch block left    Chest pain    Family history of bladder cancer    Family history of breast cancer    Family history of prostate cancer    GERD (gastroesophageal reflux disease)    History of blood transfusion    age 38   Hyperlipidemia    Hypertension    Hypothyroid    Lung nodule    Melanoma in situ of face (HCC) JUNE 2015   LEFT CHEEK   Meningioma (HCC)    brain lining   Migraine    reports only having one   Osteoarthritis    Personal history of radiation therapy    Radiation 09/16/14-10/07/14   Right  Breast  21 fractions   Skin cancer    Stroke (HCC) 2015   Blind in left eye as a result     Past Surgical History:  Procedure Laterality Date   ABDOMINAL HYSTERECTOMY  ~ 1997   APPENDECTOMY  age 44   BILATERAL SALPINGOOPHORECTOMY     BLADDER REPAIR  2009   cystocele   BRAIN MENINGIOMA EXCISION     Gamma knife to Meningioma in brain lining   BREAST BIOPSY     BREAST LUMPECTOMY Right 07/23/2014   invasive ductal   BUNIONECTOMY     bilateral great toe   CARDIAC CATHETERIZATION  2004   CHOLECYSTECTOMY  ~ 1997   COLONOSCOPY     CRYO INTERCOSTAL NERVE BLOCK Left 11/25/2014   Procedure: CRYO INTERCOSTAL NERVE BLOCK;  Surgeon: Loreli Slot, MD;  Location: [facility] OR;  Service: Thoracic;  Laterality: Left;   CT RADIATION THERAPY GUIDE     Gamma radiation -lt frontal-[facility]   DILATION AND CURETTAGE OF UTERUS     X 2   HIP ARTHROPLASTY Left 07/04/2017   Procedure: ARTHROPLASTY BIPOLAR  HIP (HEMIARTHROPLASTY);  Surgeon: Jones Broom, MD;  Location: [facility] OR;  Service: Orthopedics;  Laterality: Left;   IR THORACENTESIS ASP PLEURAL SPACE W/IMG GUIDE  02/27/2023   MELANOMA EXCISION Left 11/05/23, 11/30/13   CHEEK   SEGMENTECOMY Left 11/25/2014   Procedure: LEFT LOWER LOBE SEGMENTECTOMY;  Surgeon: Loreli Slot, MD;  Location: [facility] OR;  Service: Thoracic;  Laterality: Left;   VIDEO ASSISTED THORACOSCOPY Left 11/25/2014   Procedure: VIDEO ASSISTED THORACOSCOPY;  Surgeon: Loreli Slot, MD;  Location: [facility] OR;  Service: Thoracic;  Laterality: Left;   VIDEO BRONCHOSCOPY WITH ENDOBRONCHIAL NAVIGATION N/A 06/19/2017   Procedure: VIDEO BRONCHOSCOPY;  Surgeon: Loreli Slot, MD;  Location: [facility] OR;  Service: Thoracic;  Laterality: N/A;   VIDEO BRONCHOSCOPY WITH ENDOBRONCHIAL ULTRASOUND N/A 11/25/2014   Procedure: VIDEO BRONCHOSCOPY WITH ENDOBRONCHIAL ULTRASOUND;  Surgeon: Loreli Slot, MD;  Location: [facility] OR;  Service: Thoracic;  Laterality: N/A;     Outpatient Encounter Medications as of 03/20/2023  Medication Sig   aspirin 81 MG EC tablet    atenolol (TENORMIN) 50 MG tablet Take 50 mg by mouth at bedtime.    b complex vitamins capsule Take 1 capsule by mouth daily.   BINAXNOW COVID-19 AG HOME TEST KIT See admin instructions.   calcium carbonate (OSCAL) 1500 (600 Ca) MG TABS tablet    calcium-vitamin D (OSCAL WITH D) 500-200 MG-UNIT per tablet Take 1 tablet by mouth at bedtime.    Cholecalciferol (VITAMIN D3) 1000 units CAPS Take by mouth daily.   co-enzyme Q-10 30 MG capsule Take 30 mg by mouth daily.   denosumab (PROLIA) 60 MG/ML SOSY injection    Digestive Enzymes (ENZYME DIGEST PO) Take 5 mLs by mouth 3 (three) times daily with meals.   FLUZONE HIGH-DOSE QUADRIVALENT 0.7 ML SUSY    levothyroxine (SYNTHROID, LEVOTHROID) 112 MCG tablet Take 112 mcg by mouth daily before breakfast.    lisinopril (PRINIVIL,ZESTRIL) 10 MG tablet Take 1 tablet (10 mg total) by mouth  daily. (Patient taking differently: Take 10 mg by mouth 2 (two) times daily.)   magic mouthwash (nystatin, lidocaine, diphenhydrAMINE, alum & mag hydroxide) suspension Swish and swallow 5 mLs by mouth 3 (three) times daily as needed for mouth pain.   meclizine (ANTIVERT) 12.5 MG tablet Take 12.5 mg by mouth 3 (three) times daily as needed for dizziness.   Multiple Vitamin (MULTIVITAMIN) tablet Take 1 tablet by mouth every morning.    Omega-3 1000 MG CAPS Take 1 g by mouth daily.   osimertinib mesylate (TAGRISSO) 40 MG tablet Take 1 tablet (40 mg total) by mouth daily.   Triamcinolone Acetonide (NASACORT AQ NA) Place  1 spray into the nose at bedtime as needed (CONGESTION).   vitamin E 180 MG (400 UNITS) capsule Take 400 Units by mouth every Monday, Wednesday, and Friday. Mon Wed Fri   No facility-administered encounter medications on file as of 03/20/2023.     Today's Vitals   03/20/23 1105 03/20/23 1111  BP: 130/70 (!) 156/75  Pulse:  77  Resp:  18  Temp:  98.1 F (36.7 C)  SpO2:  100%  Weight:  109 lb (49.4 kg)  Height:  5\' 5"  (1.651 m)  PainSc: 0-No pain    Body mass index is 18.14 kg/m.   PHYSICAL EXAM GENERAL:alert, no distress and comfortable SKIN: no rash  EYES: sclera clear NECK: without mass LYMPH:  no palpable cervical or supraclavicular lymphadenopathy  LUNGS: left lung slightly dim, otherwise clear with normal breathing effort HEART: regular rate & rhythm, no lower extremity edema ABDOMEN: abdomen soft, non-tender and normal bowel sounds NEURO: alert & oriented x 3 with fluent speech, no focal motor deficits Breast exam: deferred   CBC    Component Value Date/Time   WBC 3.4 (L) 03/20/2023 1040   WBC 5.1 02/22/2023 2031   RBC 3.88 03/20/2023 1040   HGB 11.5 (L) 03/20/2023 1040   HGB 12.2 12/23/2014 0833   HCT 34.8 (L) 03/20/2023 1040   HCT 36.9 12/23/2014 0833   PLT 86 (L) 03/20/2023 1040   PLT 150 12/23/2014 0833   MCV 89.7 03/20/2023 1040   MCV 86.5  12/23/2014 0833   MCH 29.6 03/20/2023 1040   MCHC 33.0 03/20/2023 1040   RDW 13.9 03/20/2023 1040   RDW 14.3 12/23/2014 0833   LYMPHSABS 0.5 (L) 03/20/2023 1040   LYMPHSABS 0.7 (L) 12/23/2014 0833   MONOABS 0.4 03/20/2023 1040   MONOABS 0.4 12/23/2014 0833   EOSABS 0.1 03/20/2023 1040   EOSABS 0.3 12/23/2014 0833   BASOSABS 0.0 03/20/2023 1040   BASOSABS 0.1 12/23/2014 0833     CMP     Component Value Date/Time   NA 138 03/20/2023 1040   NA 136 (A) 07/09/2017 0000   NA 140 12/23/2014 0833   K 4.2 03/20/2023 1040   K 4.0 12/23/2014 0833   CL 101 03/20/2023 1040   CO2 28 03/20/2023 1040   CO2 29 12/23/2014 0833   GLUCOSE 135 (H) 03/20/2023 1040   GLUCOSE 105 12/23/2014 0833   BUN 22 03/20/2023 1040   BUN 15 07/09/2017 0000   BUN 9.5 12/23/2014 0833   CREATININE 0.74 03/20/2023 1040   CREATININE 0.7 12/23/2014 0833   CALCIUM 8.9 03/20/2023 1040   CALCIUM 9.4 12/23/2014 0833   PROT 6.7 03/20/2023 1040   PROT 6.8 12/23/2014 0833   ALBUMIN 3.9 03/20/2023 1040   ALBUMIN 3.9 12/23/2014 0833   AST 29 03/20/2023 1040   AST 15 12/23/2014 0833   ALT 28 03/20/2023 1040   ALT 14 12/23/2014 0833   ALKPHOS 66 03/20/2023 1040   ALKPHOS 91 12/23/2014 0833   BILITOT 0.6 03/20/2023 1040   BILITOT 0.53 12/23/2014 0833   GFRNONAA >60 03/20/2023 1040   GFRAA >60 10/14/2019 1255     ASSESSMENT & PLAN: 89 yo female  Primary cancer of left lower lobe of lung Stage I left lung adenocarcinoma, pT1aN0M0 in 2016, LLL recurrence in 06/2017, LUL recurrence in 10/2020, malignant left pleural effusion in 04/2022 -initially diagnosed in 11/2014, s/p LLL segmentectomy by Dr. Dorris Fetch, pathology showing stage I adenocarcinoma -She had cancer recurrence in LLL in 06/2017 and in LUL in  10/2020. She was treated with radiation in 07/2017 and in 11/2020 with Dr. Mitzi Hansen.  -She developed cancer recurrence with malignant left pleural effusion in November 2023 -[facility] showed EGFR L858R mutation(+),  which indicating good response to EGFR inhibitor, she started osimertinib in Jan 2024 -she has slightly prolonged QTc, will monitor closely when she is on Osimertinib, repeated EKG showed normal QTc -PET scan 08/02/2022 showed mildly hypermetabolic right upper lung nodule, morphologically benign appearing mediastinal and hilar lymphadenopathy with metabolic activities, no other definitive metastasis.  The left pleura is not hypermetabolic on the PET scan. -She did not tolerate higher dose Tagrisso 80 mg,  -Restaging CT 10/04/2022 showed stable left pleural effusion, previously mild hypermetabolic right middle lung nodule is less solid appearing, no other new lesions.   -Ms. Rouser appears stable. Tolerating Tagrisso 40 mg daily, fatigue improved with night time dosing. Able to function well at home with adequate PS.  -I reviewed her CT from 03/11/23 which shows stable chronic pleural effusions, the previously seen R middle lobe lesion is not visualized. She has had good response -Labs reviewed, stable pancytopenia -Continue Tagrisso 40 mg daily. She knows when to call for thora or other needs -F/up after the holidays, in 3 months, or sooner if needed   Breast cancer of upper-outer quadrant of right female breast, pT1b N0 M0 stage IA, ER positive PR positive and HER-2 negative -diagnosed in 05/2014, s/p right lumpectomy and adjuvant radiation. She declined antiestrogens due to cost and concern with side effects.  -mammogram 09/12/21 was negative; overdue -She is clinically stable from a breast standpoint.  -No recurrent breast mass on recent CT chest  PLAN: -CT and today's labs reviewed, good response -Continue Tagrisso 40 mg daily -F/up after the holidays, in 3 months, or sooner if needed   All questions were answered. The patient knows to call the clinic with any problems, questions or concerns. No barriers to learning were detected. I spent 20 minutes counseling the patient face to face. The total  time spent in the appointment was 30 minutes and more than 50% was on counseling, review of test results, and coordination of care.   Santiago Glad, NP-C 03/20/2023

## 2023-03-29 ENCOUNTER — Telehealth: Payer: Self-pay

## 2023-03-29 NOTE — Telephone Encounter (Signed)
Transition Care Management Unsuccessful Follow-up Telephone Call  Date of discharge and from where:  [facility] 9/21  Attempts:  1st Attempt  Reason for unsuccessful TCM follow-up call:  No answer/busy   Lenard Forth Arnett  [facility], [facility] Guide, Phone: [phone] Website: [name]

## 2023-04-01 ENCOUNTER — Telehealth: Payer: Self-pay

## 2023-04-01 NOTE — Telephone Encounter (Signed)
Transition Care Management Unsuccessful Follow-up Telephone Call  Date of discharge and from where:  [facility] 9/21  Attempts:  2nd  Reason for unsuccessful TCM follow-up call:  No answer/busy   Melissa Snyder  [facility], [facility] Guide, Phone: [phone] Website: [name]

## 2023-04-02 ENCOUNTER — Other Ambulatory Visit (HOSPITAL_COMMUNITY): Payer: Self-pay

## 2023-04-02 ENCOUNTER — Other Ambulatory Visit: Payer: Self-pay

## 2023-04-02 ENCOUNTER — Other Ambulatory Visit (HOSPITAL_COMMUNITY): Payer: Self-pay | Admitting: Pharmacy Technician

## 2023-04-02 ENCOUNTER — Other Ambulatory Visit: Payer: Self-pay | Admitting: Hematology

## 2023-04-02 MED ORDER — OSIMERTINIB MESYLATE 40 MG PO TABS
40.0000 mg | ORAL_TABLET | Freq: Every day | ORAL | 1 refills | Status: DC
  Filled 2023-04-02: qty 30, 30d supply, fill #0
  Filled 2023-04-29: qty 30, 30d supply, fill #1

## 2023-04-02 NOTE — Progress Notes (Signed)
Specialty Pharmacy Refill Coordination Note  Melissa Snyder is a 89 y.o. female contacted today regarding refills of specialty medication(s) Osimertinib Mesylate   Patient requested Delivery   Delivery date: 04/11/23   Verified address: [address] [place]   Medication will be filled on 04/10/23.

## 2023-04-08 ENCOUNTER — Other Ambulatory Visit: Payer: Medicare HMO

## 2023-04-08 ENCOUNTER — Ambulatory Visit: Payer: Medicare HMO | Admitting: Hematology

## 2023-04-09 ENCOUNTER — Telehealth: Payer: Self-pay | Admitting: Dietician

## 2023-04-09 ENCOUNTER — Inpatient Hospital Stay: Payer: Medicare HMO | Attending: Hematology | Admitting: Dietician

## 2023-04-09 NOTE — Telephone Encounter (Signed)
Nutrition Follow-up:  Attempted to contact patient for scheduled nutrition follow-up. Patient did not answer. Left VM with request for return call. Contact information provided

## 2023-04-29 ENCOUNTER — Other Ambulatory Visit: Payer: Self-pay

## 2023-04-29 NOTE — Progress Notes (Signed)
Specialty Pharmacy Refill Coordination Note  Melissa Snyder is a 89 y.o. female contacted today regarding refills of specialty medication(s) Osimertinib Mesylate   Patient requested Delivery   Delivery date: 05/09/23   Verified address: [address] [place]   Medication will be filled on 05/08/23.

## 2023-05-05 ENCOUNTER — Encounter (HOSPITAL_COMMUNITY): Payer: Self-pay

## 2023-05-05 ENCOUNTER — Emergency Department (HOSPITAL_COMMUNITY): Payer: Medicare HMO

## 2023-05-05 ENCOUNTER — Emergency Department (HOSPITAL_COMMUNITY)
Admission: EM | Admit: 2023-05-05 | Discharge: 2023-05-05 | Disposition: A | Discharge status: Home / Self Care | Payer: Medicare HMO | Attending: Emergency Medicine | Admitting: Emergency Medicine

## 2023-05-05 ENCOUNTER — Other Ambulatory Visit: Payer: Self-pay

## 2023-05-05 DIAGNOSIS — R0602 Shortness of breath: Secondary | ICD-10-CM | POA: Diagnosis not present

## 2023-05-05 DIAGNOSIS — C3490 Malignant neoplasm of unspecified part of unspecified bronchus or lung: Secondary | ICD-10-CM | POA: Diagnosis not present

## 2023-05-05 DIAGNOSIS — I2510 Atherosclerotic heart disease of native coronary artery without angina pectoris: Secondary | ICD-10-CM | POA: Diagnosis not present

## 2023-05-05 DIAGNOSIS — Z7982 Long term (current) use of aspirin: Secondary | ICD-10-CM | POA: Diagnosis not present

## 2023-05-05 DIAGNOSIS — I700 Atherosclerosis of aorta: Secondary | ICD-10-CM | POA: Insufficient documentation

## 2023-05-05 DIAGNOSIS — J439 Emphysema, unspecified: Secondary | ICD-10-CM | POA: Diagnosis not present

## 2023-05-05 DIAGNOSIS — J90 Pleural effusion, not elsewhere classified: Secondary | ICD-10-CM | POA: Insufficient documentation

## 2023-05-05 DIAGNOSIS — R918 Other nonspecific abnormal finding of lung field: Secondary | ICD-10-CM | POA: Diagnosis not present

## 2023-05-05 DIAGNOSIS — Z85118 Personal history of other malignant neoplasm of bronchus and lung: Secondary | ICD-10-CM | POA: Insufficient documentation

## 2023-05-05 LAB — I-STAT CHEM 8, ED
BUN: 18 mg/dL (ref 8–23)
Calcium, Ion: 1.13 mmol/L — ABNORMAL LOW (ref 1.15–1.40)
Chloride: 101 mmol/L (ref 98–111)
Creatinine, Ser: 0.8 mg/dL (ref 0.44–1.00)
Glucose, Bld: 92 mg/dL (ref 70–99)
HCT: 36 % (ref 36.0–46.0)
Hemoglobin: 12.2 g/dL (ref 12.0–15.0)
Potassium: 4.1 mmol/L (ref 3.5–5.1)
Sodium: 139 mmol/L (ref 135–145)
TCO2: 25 mmol/L (ref 22–32)

## 2023-05-05 LAB — CBC WITH DIFFERENTIAL/PLATELET
Abs Immature Granulocytes: 0.01 10*3/uL (ref 0.00–0.07)
Basophils Absolute: 0 10*3/uL (ref 0.0–0.1)
Basophils Relative: 1 %
Eosinophils Absolute: 0.3 10*3/uL (ref 0.0–0.5)
Eosinophils Relative: 5 %
HCT: 36.9 % (ref 36.0–46.0)
Hemoglobin: 12.1 g/dL (ref 12.0–15.0)
Immature Granulocytes: 0 %
Lymphocytes Relative: 7 %
Lymphs Abs: 0.4 10*3/uL — ABNORMAL LOW (ref 0.7–4.0)
MCH: 29.3 pg (ref 26.0–34.0)
MCHC: 32.8 g/dL (ref 30.0–36.0)
MCV: 89.3 fL (ref 80.0–100.0)
Monocytes Absolute: 0.6 10*3/uL (ref 0.1–1.0)
Monocytes Relative: 12 %
Neutro Abs: 3.9 10*3/uL (ref 1.7–7.7)
Neutrophils Relative %: 75 %
Platelets: 87 10*3/uL — ABNORMAL LOW (ref 150–400)
RBC: 4.13 MIL/uL (ref 3.87–5.11)
RDW: 13.6 % (ref 11.5–15.5)
Smear Review: DECREASED
WBC: 5.2 10*3/uL (ref 4.0–10.5)
nRBC: 0 % (ref 0.0–0.2)

## 2023-05-05 LAB — BRAIN NATRIURETIC PEPTIDE: B Natriuretic Peptide: 414.5 pg/mL — ABNORMAL HIGH (ref 0.0–100.0)

## 2023-05-05 LAB — D-DIMER, QUANTITATIVE: D-Dimer, Quant: 1.03 ug{FEU}/mL — ABNORMAL HIGH (ref 0.00–0.50)

## 2023-05-05 MED ORDER — LIDOCAINE HCL (PF) 1 % IJ SOLN: 10.0000 mL | Freq: Once | INTRAMUSCULAR | Status: DC

## 2023-05-05 MED ORDER — ALBUTEROL SULFATE HFA 108 (90 BASE) MCG/ACT IN AERS: 2.0000 | INHALATION_SPRAY | RESPIRATORY_TRACT | Status: DC | PRN

## 2023-05-05 MED ORDER — IOHEXOL 350 MG/ML SOLN
60.0000 mL | Freq: Once | INTRAVENOUS | Status: AC | PRN
  Administered 2023-05-05: 60 mL via INTRAVENOUS

## 2023-05-05 NOTE — ED Provider Notes (Signed)
[facility] AT [facility] Provider Note   CSN: [id number] Arrival date & time: 05/05/23  4098     History  Chief Complaint  Patient presents with   Shortness of Breath    Melissa Snyder is a 89 y.o. female.  Patient is a 89 year old female who presents with shortness of breath.  She has a history of adenocarcinoma of the left lung.  She has had a recent recurrence.  She has had some recurrent pleural effusions.  She has had 2 prior thoracentesis in the past.  She says she has had some worsening shortness of breath over the last few days.  She feels like the fluid is built up again.  She had called her doctor and is scheduled to have a repeat thoracentesis tomorrow.  However she was more short of breath today and felt like she needed to have it done today.  She feels like her symptoms are consistent with the prior times that she has had the pleural effusion.  She denies any significant cough.  No fevers.  No associated chest pain.  No leg swelling.       Home Medications Prior to Admission medications   Medication Sig Start Date End Date Taking? Authorizing Provider  aspirin 81 MG EC tablet  05/09/09   [provider]  atenolol (TENORMIN) 50 MG tablet Take 50 mg by mouth at bedtime.     [provider]  b complex vitamins capsule Take 1 capsule by mouth daily.    [provider]  Sherrie Sport COVID-19 AG HOME TEST KIT See admin instructions. 02/13/21   [provider]  calcium carbonate (OSCAL) 1500 (600 Ca) MG TABS tablet  04/25/11   [provider]  calcium-vitamin D (OSCAL WITH D) 500-200 MG-UNIT per tablet Take 1 tablet by mouth at bedtime.     [provider]  Cholecalciferol (VITAMIN D3) 1000 units CAPS Take by mouth daily.    [provider]  co-enzyme Q-10 30 MG capsule Take 30 mg by mouth daily.    [provider]  denosumab (PROLIA) 60 MG/ML SOSY injection  11/03/15   [provider]  Digestive Enzymes (ENZYME DIGEST PO) Take 5 mLs by mouth 3 (three) times daily with meals.    [provider]  FLUZONE HIGH-DOSE QUADRIVALENT 0.7 ML SUSY  03/02/21   [provider]  levothyroxine (SYNTHROID, LEVOTHROID) 112 MCG tablet Take 112 mcg by mouth daily before breakfast.     [provider]  lisinopril (PRINIVIL,ZESTRIL) 10 MG tablet Take 1 tablet (10 mg total) by mouth daily. Patient taking differently: Take 10 mg by mouth 2 (two) times daily. 11/29/14   Rowe Clack, PA-C  magic mouthwash (nystatin, lidocaine, diphenhydrAMINE, alum & mag hydroxide) suspension Swish and swallow 5 mLs by mouth 3 (three) times daily as needed for mouth pain. 08/10/22   Malachy Mood, MD  meclizine (ANTIVERT) 12.5 MG tablet Take 12.5 mg by mouth 3 (three) times daily as needed for dizziness.    [provider]  Multiple Vitamin (MULTIVITAMIN) tablet Take 1 tablet by mouth every morning.     [provider]  Omega-3 1000 MG CAPS Take 1 g by mouth daily.    [provider]  osimertinib mesylate (TAGRISSO) 40 MG tablet Take 1 tablet (40 mg total) by mouth daily. 04/02/23   Malachy Mood, MD  Triamcinolone Acetonide (NASACORT AQ NA) Place 1 spray into the nose at bedtime as needed (CONGESTION).  [provider]  vitamin E 180 MG (400 UNITS) capsule Take 400 Units by mouth every Monday, Wednesday, and Friday. Mon Wed Fri    [provider]      Allergies    Beef-derived products, Other, Chocolate, Ibuprofen, Niacin and related, and Tramadol    Review of Systems   Review of Systems  Constitutional:  Negative for chills, diaphoresis, fatigue and fever.  HENT:  Negative for congestion, rhinorrhea and sneezing.   Eyes: Negative.   Respiratory:  Positive for shortness of breath. Negative for cough and chest tightness.   Cardiovascular:  Negative for chest pain and leg swelling.  Gastrointestinal:  Negative for abdominal pain, blood  in stool, diarrhea, nausea and vomiting.  Genitourinary:  Negative for difficulty urinating, flank pain, frequency and hematuria.  Musculoskeletal:  Negative for arthralgias and back pain.  Skin:  Negative for rash.  Neurological:  Negative for dizziness, speech difficulty, weakness, numbness and headaches.    Physical Exam Updated Vital Signs BP (!) 151/78   Pulse 82   Temp 98.2 F (36.8 C)   Resp 17   Ht 5\' 5"  (1.651 m)   Wt 49 kg   SpO2 99%   BMI 17.97 kg/m  Physical Exam Constitutional:      Appearance: She is well-developed.  HENT:     Head: Normocephalic and atraumatic.  Eyes:     Pupils: Pupils are equal, round, and reactive to light.  Cardiovascular:     Rate and Rhythm: Normal rate and regular rhythm.     Heart sounds: Normal heart sounds.  Pulmonary:     Effort: Pulmonary effort is normal. No respiratory distress.     Breath sounds: Normal breath sounds. No wheezing or rales.  Chest:     Chest wall: No tenderness.  Abdominal:     General: Bowel sounds are normal.     Palpations: Abdomen is soft.     Tenderness: There is no abdominal tenderness. There is no guarding or rebound.  Musculoskeletal:        General: Normal range of motion.     Cervical back: Normal range of motion and neck supple.     Comments: No edema or calf tenderness  Lymphadenopathy:     Cervical: No cervical adenopathy.  Skin:    General: Skin is warm and dry.     Findings: No rash.  Neurological:     Mental Status: She is alert and oriented to person, place, and time.     ED Results / Procedures / Treatments   Labs (all labs ordered are listed, but only abnormal results are displayed) Labs Reviewed  BRAIN NATRIURETIC PEPTIDE - Abnormal; Notable for the following components:      Result Value   B Natriuretic Peptide 414.5 (*)    All other components within normal limits  D-DIMER, QUANTITATIVE - Abnormal; Notable for the following components:   D-Dimer, Quant 1.03 (*)    All  other components within normal limits  CBC WITH DIFFERENTIAL/PLATELET - Abnormal; Notable for the following components:   Platelets 87 (*)    Lymphs Abs 0.4 (*)    All other components within normal limits  I-STAT CHEM 8, ED - Abnormal; Notable for the following components:   Calcium, Ion 1.13 (*)    All other components within normal limits    EKG EKG Interpretation Date/Time:  Sunday May 05 2023 07:12:18 EST Ventricular Rate:  87 PR Interval:  204 QRS Duration:  152 QT Interval:  448 QTC Calculation: 539 R Axis:   26  Text Interpretation: Normal sinus rhythm Left bundle branch block Abnormal ECG When compared with ECG of 02-Aug-2022 12:46, PREVIOUS ECG IS PRESENT since last tracing no significant change Confirmed by Rolan Bucco [phone]) on 05/05/2023 7:55:48 AM  Radiology US THORACENTESIS ASP PLEURAL SPACE W/IMG GUIDE  Result Date: 05/05/2023 INDICATION: Lung Cancer Recurrent Left pleural effusion EXAM: ULTRASOUND GUIDED LEFT THORACENTESIS MEDICATIONS: 10 cc 1% lidocaine. COMPLICATIONS: None immediate. PROCEDURE: An ultrasound guided thoracentesis was thoroughly discussed with the patient and questions answered. The benefits, risks, alternatives and complications were also discussed. The patient understands and wishes to proceed with the procedure. Written consent was obtained. Ultrasound was performed to localize and mark an adequate pocket of fluid in the left chest. The area was then prepped and draped in the normal sterile fashion. 1% Lidocaine was used for local anesthesia. Under ultrasound guidance a 7 cm Yueh catheter was introduced. Thoracentesis was performed. The catheter was removed and a dressing applied. FINDINGS: A total of approximately 850 cc of serous fluid was removed. Samples were sent to the laboratory as requested by the clinical team Successful ultrasound guided left thoracentesis yielding 850 cc of pleural fluid. Performed by Robet Leu [facility] Electronically  Signed   By: Simonne Come M.D.   On: 05/05/2023 11:11   DG Chest 1 View  Result Date: 05/05/2023 CLINICAL DATA:  Thoracentesis EXAM: CHEST  1 VIEW COMPARISON:  Same-day x-ray FINDINGS: Near-complete resolution of previously seen left pleural effusion following thoracentesis. No pneumothorax. Normal heart size. No new airspace opacity. Stable areas of scarring bilaterally. Hyperinflated lungs. IMPRESSION: Near-complete resolution of previously seen left pleural effusion following thoracentesis. No pneumothorax. Electronically Signed   By: Duanne Guess D.O.   On: 05/05/2023 11:09   CT Angio Chest PE W/Cm &/Or Wo Cm  Result Date: 05/05/2023 CLINICAL DATA:  Pulmonary embolism (PE) suspected, low to intermediate prob, positive D-dimer. History of lung cancer EXAM: CT ANGIOGRAPHY CHEST WITH CONTRAST TECHNIQUE: Multidetector CT imaging of the chest was performed using the standard protocol during bolus administration of intravenous contrast. Multiplanar CT image reconstructions and MIPs were obtained to evaluate the vascular anatomy. RADIATION DOSE REDUCTION: This exam was performed according to the departmental dose-optimization program which includes automated exposure control, adjustment of the mA and/or kV according to patient size and/or use of iterative reconstruction technique. CONTRAST:  60mL OMNIPAQUE IOHEXOL 350 MG/ML SOLN COMPARISON:  03/11/2023 FINDINGS: Cardiovascular: Heart size within normal limits. No pericardial effusion thoracic aorta is nonaneurysmal. Atherosclerotic calcifications of the aorta and coronary arteries. Central pulmonary vasculature is adequately opacified. No pulmonary arterial filling defects identified. Mediastinum/Nodes: No enlarged mediastinal, hilar, or axillary lymph nodes. Thyroid gland, trachea, and esophagus demonstrate no significant findings. Lungs/Pleura: Moderate-sized left pleural effusion, increased. Dependent atelectasis in the left lung base. Chronic scarring  along the suture within the left lung. Subpleural scarring along the anterior aspect of the right upper lobe is also unchanged. Right apical pleuroparenchymal scarring. Small scattered ground-glass nodules are unchanged. No new or enlarging pulmonary nodules. No right-sided pleural effusion. No pneumothorax. Upper Abdomen: No acute abnormality. Musculoskeletal: Exaggerated thoracic kyphosis multilevel degenerative spondylosis. No new or acute bony abnormality. No chest wall abnormality. Review of the MIP images confirms the above findings. IMPRESSION: 1. No evidence of pulmonary embolism. 2. Moderate-sized left pleural effusion, increased. 3. Stable postsurgical changes within the left lung. 4. Aortic and coronary artery atherosclerosis (ICD10-I70.0). Electronically Signed   By: Duanne Guess D.O.  On: 05/05/2023 10:28   DG Chest 2 View  Result Date: 05/05/2023 CLINICAL DATA:  Shortness of breath. EXAM: CHEST - 2 VIEW COMPARISON:  CT without contrast 03/11/2023 FINDINGS: The lungs demonstrate emphysematous, scarring and postsurgical changes. Small left pleural effusion is again noted and seems unchanged. There is overlying atelectasis or airspace disease. There are no other significant opacities. The right sulci are sharp. Stable cardiomediastinal silhouette with aortic tortuosity and calcification, normal heart size. No new osseous findings. Osteopenia and degenerative change of the spine. Thoracic kyphosis. IMPRESSION: 1. Small left pleural effusion with overlying atelectasis or airspace disease. No other significant change. 2. Emphysema, scarring and postsurgical changes. 3. Aortic atherosclerosis. Electronically Signed   By: Almira Bar M.D.   On: 05/05/2023 07:37    Procedures Procedures    Medications Ordered in ED Medications  albuterol (VENTOLIN HFA) 108 (90 Base) MCG/ACT inhaler 2 puff (has no administration in time range)  lidocaine (PF) (XYLOCAINE) 1 % injection 10 mL (has no  administration in time range)  iohexol (OMNIPAQUE) 350 MG/ML injection 60 mL (60 mLs Intravenous Contrast Given 05/05/23 0951)    ED Course/ Medical Decision Making/ A&P                                 Medical Decision Making Amount and/or Complexity of Data Reviewed Labs: ordered. Radiology: ordered.  Risk Prescription drug management.   Patient is a 89 year old who presents with shortness of breath.  She has had prior pleural effusions that have required thoracentesis.  She is not hypoxic.  She has some mild tachypnea.  X-ray showed a smaller looking sized left-sided pleural effusion.  This was interpreted by me and confirmed by the radiologist.  Given that it was not very large, I did do some blood work to assess for other possible etiologies for her shortness of breath.  She does not have symptoms that would be more concerning for ACS.  Her EKG does not show any ischemic changes.  She does not have any suggestions of pneumonia.  Her D-dimer was a little bit elevated.  CT of the chest showed no evidence of PE but there was a moderate size pleural effusion noted.  I did consult interventional radiology who was able to do the thoracentesis.  She feels much better after this and is ready to go home.  She was discharged home in good condition.  She will follow-up with her oncologist.  Return precautions were given.  Final Clinical Impression(s) / ED Diagnoses Final diagnoses:  Pleural effusion on left    Rx / DC Orders ED Discharge Orders     None         Rolan Bucco, MD 05/05/23 1240

## 2023-05-05 NOTE — Procedures (Signed)
   IR Note  US guided left thoracentesis  850 cc serous color fluid No labs per MD  Tolerated well  CXR pending  EBL: scant

## 2023-05-05 NOTE — ED Triage Notes (Signed)
Pt arrived POV from home c/o [facility] that has gotten worse over the last few days. Pt states she is scheduled for a paracentesis tomorrow but could not sleep d/t being [facility] and decided to come in.

## 2023-05-06 ENCOUNTER — Ambulatory Visit (HOSPITAL_COMMUNITY)
Admission: RE | Admit: 2023-05-06 | Discharge: 2023-05-06 | Disposition: A | Discharge status: Home / Self Care | Payer: Medicare HMO | Source: Ambulatory Visit | Attending: Hematology | Admitting: Hematology

## 2023-05-07 ENCOUNTER — Telehealth: Payer: Self-pay

## 2023-05-07 NOTE — Telephone Encounter (Signed)
Pt called requesting to speak with Dr. Mosetta Putt regarding her recent ED visit.  Pt stated while in the ED she got another thoracentesis and on the paperwork it mentioned something about a procedure that could be done to reduce the reaccumulation of fluid in the pt's chest.  Pt wants to know if she's a candidate for this procedure.  Stated that Dr. Mosetta Putt is currently in clinic seeing pts but this nurse will ask Dr. Mosetta Putt to please give pt a call.

## 2023-05-08 ENCOUNTER — Other Ambulatory Visit: Payer: Self-pay

## 2023-05-08 ENCOUNTER — Telehealth: Payer: Self-pay

## 2023-05-08 DIAGNOSIS — C3432 Malignant neoplasm of lower lobe, left bronchus or lung: Secondary | ICD-10-CM

## 2023-05-08 DIAGNOSIS — C50411 Malignant neoplasm of upper-outer quadrant of right female breast: Secondary | ICD-10-CM

## 2023-05-08 NOTE — Telephone Encounter (Signed)
Pt called again today and LVM stating she would like to speak with Dr. Mosetta Putt regarding her recent ED visit and thoracentesis.  Pt stated was removed and she's feel really more fatigue than her previous thoracentesis.  Pt also stated that she would like to discuss with Dr. Mosetta Putt if she would be a candidate for a procedure that was discussed with pt while in the ED that would decrease the reaccumulation of fluid in her lungs.  Returned pt's call to make pt aware that Dr. Latanya Maudlin office received her call/s and Dr. Mosetta Putt is aware of the pt's call.  Stated Dr. Mosetta Putt will contact the pt to further discuss the procedure she's referring to.  Pt verbalized understanding and stated she will await Dr. Latanya Maudlin call.

## 2023-05-09 ENCOUNTER — Telehealth: Payer: Self-pay | Admitting: Hematology

## 2023-05-09 DIAGNOSIS — E785 Hyperlipidemia, unspecified: Secondary | ICD-10-CM | POA: Diagnosis not present

## 2023-05-10 ENCOUNTER — Encounter (HOSPITAL_COMMUNITY)
Admission: RE | Admit: 2023-05-10 | Discharge: 2023-05-10 | Disposition: A | Discharge status: Home / Self Care | Payer: Medicare HMO | Source: Ambulatory Visit | Attending: Hematology | Admitting: Hematology

## 2023-05-10 DIAGNOSIS — C50919 Malignant neoplasm of unspecified site of unspecified female breast: Secondary | ICD-10-CM | POA: Diagnosis not present

## 2023-05-10 DIAGNOSIS — Z170 Estrogen receptor positive status [ER+]: Secondary | ICD-10-CM | POA: Insufficient documentation

## 2023-05-10 DIAGNOSIS — C50411 Malignant neoplasm of upper-outer quadrant of right female breast: Secondary | ICD-10-CM | POA: Diagnosis not present

## 2023-05-10 DIAGNOSIS — C3432 Malignant neoplasm of lower lobe, left bronchus or lung: Secondary | ICD-10-CM | POA: Diagnosis not present

## 2023-05-10 LAB — GLUCOSE, CAPILLARY: Glucose-Capillary: 105 mg/dL — ABNORMAL HIGH (ref 70–99)

## 2023-05-10 MED ORDER — FLUDEOXYGLUCOSE F - 18 (FDG) INJECTION
5.2600 | Freq: Once | INTRAVENOUS | Status: AC
  Administered 2023-05-10: 5.26 via INTRAVENOUS

## 2023-05-14 NOTE — Assessment & Plan Note (Signed)
Stage I left lung adenocarcinoma, [id number] in 2016, LLL recurrence in 06/2017, LUL recurrence in 10/2020, malignant left pleural effusion in 04/2022 -initially diagnosed in 11/2014, s/p LLL segmentectomy by Dr. Dorris Fetch, pathology showing stage I adenocarcinoma -She had cancer recurrence in LLL in 06/2017 and in LUL in 10/2020. She was treated with radiation in 07/2017 and in 11/2020 with Dr. Mitzi Hansen.  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  The left pleura is not hypermetabolic on the PET scan. -She previously tolerated Tagrisso 40 mg daily very well, I increased the dose to 80 mg daily on last visit, she did not tolerated well, and have reduced dose back to 40 mg daily -Restaging CT scan from Oct 04, 2022 showed

## 2023-05-15 ENCOUNTER — Other Ambulatory Visit: Payer: Self-pay

## 2023-05-15 ENCOUNTER — Inpatient Hospital Stay: Payer: Medicare HMO | Attending: Hematology | Admitting: Hematology

## 2023-05-15 VITALS — BP 142/69 | HR 90 | Temp 97.7°F | Resp 15 | Wt 107.0 lb

## 2023-05-15 DIAGNOSIS — Z0000 Encounter for general adult medical examination without abnormal findings: Secondary | ICD-10-CM | POA: Diagnosis not present

## 2023-05-15 DIAGNOSIS — I10 Essential (primary) hypertension: Secondary | ICD-10-CM | POA: Diagnosis not present

## 2023-05-15 DIAGNOSIS — C50411 Malignant neoplasm of upper-outer quadrant of right female breast: Secondary | ICD-10-CM

## 2023-05-15 DIAGNOSIS — C3490 Malignant neoplasm of unspecified part of unspecified bronchus or lung: Secondary | ICD-10-CM | POA: Diagnosis not present

## 2023-05-15 DIAGNOSIS — R0602 Shortness of breath: Secondary | ICD-10-CM

## 2023-05-15 DIAGNOSIS — E785 Hyperlipidemia, unspecified: Secondary | ICD-10-CM | POA: Diagnosis not present

## 2023-05-15 DIAGNOSIS — D329 Benign neoplasm of meninges, unspecified: Secondary | ICD-10-CM | POA: Diagnosis not present

## 2023-05-15 DIAGNOSIS — C3432 Malignant neoplasm of lower lobe, left bronchus or lung: Secondary | ICD-10-CM | POA: Insufficient documentation

## 2023-05-15 DIAGNOSIS — E039 Hypothyroidism, unspecified: Secondary | ICD-10-CM | POA: Diagnosis not present

## 2023-05-15 DIAGNOSIS — Z1331 Encounter for screening for depression: Secondary | ICD-10-CM | POA: Diagnosis not present

## 2023-05-15 DIAGNOSIS — B370 Candidal stomatitis: Secondary | ICD-10-CM | POA: Diagnosis not present

## 2023-05-15 DIAGNOSIS — R350 Frequency of micturition: Secondary | ICD-10-CM | POA: Diagnosis not present

## 2023-05-15 DIAGNOSIS — Z9221 Personal history of antineoplastic chemotherapy: Secondary | ICD-10-CM | POA: Insufficient documentation

## 2023-05-15 DIAGNOSIS — J309 Allergic rhinitis, unspecified: Secondary | ICD-10-CM | POA: Diagnosis not present

## 2023-05-15 DIAGNOSIS — Z853 Personal history of malignant neoplasm of breast: Secondary | ICD-10-CM | POA: Insufficient documentation

## 2023-05-15 DIAGNOSIS — Z923 Personal history of irradiation: Secondary | ICD-10-CM | POA: Diagnosis not present

## 2023-05-15 DIAGNOSIS — M810 Age-related osteoporosis without current pathological fracture: Secondary | ICD-10-CM | POA: Diagnosis not present

## 2023-05-15 DIAGNOSIS — R82998 Other abnormal findings in urine: Secondary | ICD-10-CM | POA: Diagnosis not present

## 2023-05-15 DIAGNOSIS — H9190 Unspecified hearing loss, unspecified ear: Secondary | ICD-10-CM | POA: Diagnosis not present

## 2023-05-15 NOTE — Progress Notes (Signed)
[facility]   Telephone:(336) [phone] Fax:(336) [phone]   Clinic Follow up Note   Patient Care Team: Rodrigo Ran, MD as PCP - General (Internal Medicine) Loreli Slot, MD as Consulting Physician (Cardiothoracic Surgery) Alfredo Martinez, MD as Consulting Physician (Urology) Malachy Mood, MD as Consulting Physician (Hematology) Lurline Hare, MD as Consulting Physician (Radiation Oncology) Ronny Bacon, PA-C as Physician Assistant (Radiation Oncology) Imaging, [facility] as Consulting Physician (Diagnostic Radiology)  Date of Service:  05/15/2023  CHIEF COMPLAINT: f/u of metastatic non-small cell lung cancer  CURRENT THERAPY:  Tagrisso 40 mg daily  Oncology History   Primary cancer of left lower lobe of lung (HCC) Stage I left lung adenocarcinoma, pT1aN0M0 in 2016, LLL recurrence in 06/2017, LUL recurrence in 10/2020, malignant left pleural effusion in 04/2022 -initially diagnosed in 11/2014, s/p LLL segmentectomy by Dr. Dorris Fetch, pathology showing stage I adenocarcinoma -She had cancer recurrence in LLL in 06/2017 and in LUL in 10/2020. She was treated with radiation in 07/2017 and in 11/2020 with Dr. Mitzi Hansen.  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  The left pleura is not hypermetabolic on the PET scan. -She previously tolerated Tagrisso 40 mg daily very well, I increased the dose to 80 mg daily on last visit, she did not tolerated  well, and have reduced dose back to 40 mg daily -Restaging CT scan from Oct 04, 2022 showed    Assessment and Plan    Metastatic Lung Cancer with Pleural Effusion Follow-up for metastatic lung cancer with pleural effusion. Reports significant weakness and severe dyspnea post-pleural fluid drainage, necessitating an emergency room visit. PET scan shows no significant cancer progression, with minimal activity in lymph nodes and pleura. Pleural effusion remains stable. On Tagrisso for a year, with increased frequency of pleural effusion drainage, raising concerns about potential cancer progression despite stable PET scan findings. Discussed higher dose Tagrisso or chemotherapy. Prefers to avoid chemotherapy due to age and previous adverse experiences with higher doses of Tagrisso. Informed about chemotherapy risks including fatigue, appetite issues, diarrhea, and nausea. Discussed hospice care option if treatment is stopped to focus on quality of life. - Continue Tagrisso at current dose - Monitor for pleural effusion symptoms and contact radiology for drainage - Send cells for cytology and mutation testing during next pleural effusion drainage - Schedule follow-up in 5-6 weeks with lab work - Ensure adequate hydration post-procedure  General Health Maintenance Physical exam scheduled with family doctor and recent blood work. No new issues reported. - Review blood work results from family doctor and report any issues  Plan -PET scan reviewed, stable disease -She will continue reduced Tagrisso, she was previously not able to tolerate a full dose - Schedule follow-up appointment in 5-6 weeks -She will call me when she needs next thoracentesis, and I will request cytology and genomic testing - Perform lab work at next visit.         SUMMARY OF ONCOLOGIC HISTORY: Oncology History Overview Note   Cancer Staging  Breast cancer of upper-outer quadrant of right female breast [facility]) Staging form:  Breast, AJCC 7th Edition - Clinical stage from 05/19/2014: Stage IA (T1b, N0, M0) - Signed by Hubbard Hartshorn, NP on 11/17/2015 - Pathologic stage from 10/17/2014: Stage IA (T1b, N0, cM0) - Signed by Lurline Hare, MD on 10/17/2014  Primary cancer of left lower lobe of lung [facility]) Staging form: Lung, AJCC 7th Edition - Clinical stage from 12/14/2014: Stage IA (T1a, N0, M0) - Unsigned    Breast cancer of upper-outer quadrant of right female breast (HCC)  05/04/2014 Initial Diagnosis   Breast cancer   05/19/2014 Imaging   Diagnostic mammogram and ultrasound showed a 7 mm irregular suspicious mass located in the right breast at the 10:00 position, 7 cm from the nipple. Otherwise negative.   05/19/2014 Initial Biopsy   Right breast biopsy (UOQ): IDC, grade 2.    05/19/2014 Receptors her2   ER 100% positive, PR 35% positive, HER-2 negative, Ki67 8%   06/07/2014 Miscellaneous   Genetic testing normal.  APC, ATM, BARD1, BMPR1A, BRCA1, BRCA2, BRIP1, CDH1, CDK4, CDKN2A, CHEK2, EPCAM, GREM1, MLH1, MRE11A, MSH2, MSH6, MUTYH, NBN, NF1, PALB2, PMS2, POLD1, POLE, PTEN, RAD50, RAD51D, SMAD4, SMARCA4, STK11, and TP53 tested.    07/23/2014 Surgery   Right breast lumpectomy with SLNB Ezzard Standing) showed invasive ductal carcinoma & DCIS; negative surgical margins.   07/23/2014 Pathology Results   Invasive ductal carcinoma, tumor size 0.8 cm, grade 1, no lymphovascular invasion, margins were negative, 2 sentinel lymph nodes were negative. (+) DCIS.   07/23/2014 Pathologic Stage   pT1b, pN0: Stage IA    09/16/2014 - 10/07/2014 Radiation Therapy   XRT completed Michell Heinrich). Right breast/ 42.72 Gy at 2.67 Gy per fraction x 21 fractions.     Anti-estrogen oral therapy   Aromasin prescribed in 01/2015 but Patient declines anti-estrogen therapy at this time d/t high co-pay of medication and possible side effects of AI therapy.    05/02/2015 Mammogram   Diagnostic TOMO bilat mammogram: No evidence of malignancy in  either breast. Lumpectomy changes in right breast. Diagnostic mammo suggested in 1 year.    Primary cancer of left lower lobe of lung (HCC)  10/25/2013 Imaging   CXR done for stroke protocol. No acute chest findings. Chronic lung disease with biapical scarring. Developing nodule in (L) apex cannot be excluded based on exam, athough may be d/t overlap of osseous structures.    10/27/2013 Imaging   CXR: Nodular opacity in mid (L) apex and appears more prominent than prior study. Area that appeared masslike in LUL near apex on recent CXR not seen at this time.  Underlying emphysema present. No edema or consolidation   11/12/2013 Imaging   CT chest: At left lung apex, there is asymmetric pleural parenchymal scarring. No discrete lesion.  In superior segment of LLL, there are 1 dominant ground-glass opacity measuring 16 mm. 2 addt'l ground-glass opacities appreciated. F/U with CT in 3 months   02/11/2014 Imaging   CT chest: Several small bilat faint nodular densities, largest measures 1.3 cm over superior segment LLL. No new nodules, no adenopathy. Cannot exclude metastatic disease given pt's h/o melanoma. Recommend PET-CT   02/23/2014 PET scan   Persistent semi-solid nodule in superior segment LLL with low metabolic activity. Moderately metabolic subcarinal & hilar LN are likely reactive.    05/25/2014 Imaging   CT chest: Persistent & similar size dominant LLL sub-solid nodule. Adenocarcinoma is considered. Biopsy or resection strongly considered. Additional scattered small ground-glass nodules stable.    06/02/2014 Imaging   MRI brain: Grossly stable appearance of  left planum sphenoidale meningioma measuring 20 x 14 mm. Meningioma does not extend to left orbital apex & contacts left optic nerve. Stable right posterior clinoid meningioma .    09/27/2014 Imaging   CT chest: Further enlargment of central solid component in sub-solid LLL pulm nodule. Findings remain concerning for adenoca. Additional  scattered small bilat ground-glass nodules and biapical scarring stable. No adenopathy.     11/25/2014 Surgery   Video bronchoscopy with endobronchial ultrasound; Video assisted thoracoscopy; Thoracoscopic LLL superior segmentectomy; Mediastinal lymph node dissection; Cyro intercostal nerve block Dorris Fetch)   11/25/2014 Pathology Results   Superior segment lung resection (LLL). Adenocarcinoma, well-diff, spanning 1.5 cm. Surgical margins negative. 5 LNs negative.    11/25/2014 Pathologic Stage   pT1a, pN0: Stage IA    11/25/2014 Initial Diagnosis   Primary cancer of left lower lobe of lung (HCC)   05/31/2015 Imaging   CT chest: No evidence of local recurrent at superior segmentectomy in LLL. New small nodular focus of consolidation surrounding thin parenchymal band. Additional sub-cm ground-glass and solid nodules favored to be benign. No thoracic adenopathy   08/11/2015 Imaging   CT chest: No evidence of residual or recurrent tumor in LLL. Stable small nodular focus in LLL with thin surrounding parenchymal band, favored to be benign.  Multiple small ground-glass nodules unchanged.    06/19/2017 Pathology Results   Diagnosis 1. Lung, biopsy, Left lower lobe - ADENOCARCINOMA. - SEE COMMENT. 2. Lung, biopsy, Left lower lobe - BENIGN LUNG PARENCHYMA. - THERE IS NO EVIDENCE OF MALIGNANCY.   07/09/2017 - 07/22/2017 Radiation Therapy   Radiation treatment dates: 07/09/17-07/22/17 by Dr. Mitzi Hansen    Site/dose:  Left lung/ 50 Gy in 10 fractions   Beams/energy:  IMRT/ 6X   Narrative: The patient tolerated radiation treatment relatively well. She denied pain, fatigue, or difficulty swallowing. Skin to the treatment area appears warm, dry, and normal in color.   06/18/2019 Imaging   CT Chest  IMPRESSION: 1. There are 2 round solid nodules within the left lower lobe which have increased in size when compared with the previous exam, suspicious. Additionally, along the area of postsurgical  change around the oblique fissure of the left lung there is progressive thickening with persistent loculated fluid. Cannot rule out local tumor recurrence along the suture line. 2. No significant change in the appearance of bilateral upper lobe sub solid lung nodules. 3. Aortic Atherosclerosis (ICD10-I70.0). Coronary artery calcifications.     07/07/2019 PET scan   IMPRESSION: 1. No evidence of hypermetabolic local tumor recurrence in the left lung. Low level nonfocal uptake along the segmentectomy site in the left lower lobe favors post treatment change. Continued chest CT surveillance warranted. 2. Two small solid left lower lobe pulmonary nodules, largest 0.7 cm, below PET resolution. Given the growth of these nodules since 10/03/2018 chest CT, metastatic disease remains on the differential. Close chest CT surveillance recommended. 3. Nonspecific hypermetabolism within nonenlarged subcarinal and bilateral hilar lymph nodes, not substantially changed since 03/04/2017 PET-CT study, favoring reactive uptake. 4. No hypermetabolic distant metastatic disease. 5. Chronic findings include: Aortic Atherosclerosis (ICD10-I70.0). Subcentimeter ground-glass pulmonary nodules in the upper lobes are unchanged and are below PET resolution. Moderate sigmoid diverticulosis. Chronic right maxillary sinusitis.   04/13/2020 Imaging   CT Chest  IMPRESSION: 1. Primarily similar appearance of the lungs compared to 10/14/2019. Left sided surgical sutures with similar solid and subsolid pulmonary nodules as detailed above. A right middle lobe pleural based 5 mm nodule is new since the prior  exam, warranting follow-up attention. 2. No thoracic adenopathy. 3. Aortic atherosclerosis (ICD10-I70.0), coronary artery atherosclerosis and emphysema (ICD10-J43.9).   10/10/2020 Imaging   CT chest  IMPRESSION: Postsurgical and post radiation changes in the lungs bilaterally, as above.   12 x 10 mm left  lower lobe nodule, mildly progressive. Additional bilobed nodule in the posterior left lower lobe, also favored to be progressive. PET-CT is suggested for further evaluation.   Aortic Atherosclerosis (ICD10-I70.0) and Emphysema (ICD10-J43.9).   11/22/2020 - 12/02/2020 Radiation Therapy   Site Technique Total Dose (Gy) Dose per Fx (Gy) Completed Fx Beam Energies  Lung, Left: Lung_Lt_LLL IMRT 60/60 12 5/5 6XFFF     05/22/2021 Imaging   EXAM: CT CHEST WITHOUT CONTRAST  IMPRESSION: 1. Interval development of a new area of volume loss, ground-glass opacity, and bronchiectasis in the posterior left lower lobe. Imaging features are compatible with radiation therapy. 2. The posterior left lung nodules, including index 12 x 10 mm nodule described previously have become incorporated into the post treatment change on the current study. 3. Interval resolution of the posterior right middle lobe nodularity seen previously. 4. Other scattered areas of consolidative opacity or similar. 5. Trace left pleural effusion. 6. Aortic Atherosclerosis (ICD10-I70.0).      Discussed the use of AI scribe software for clinical note transcription with the patient, who gave verbal consent to proceed.  History of Present Illness   The patient, with a history of metastatic nostril and lung cancer, presents with increased weakness and fatigue, particularly after having fluid drained from her lungs. She reports feeling so weak the day after the procedure that she was unable to do much of anything for several days. She states that she has never experienced this level of weakness before. The patient also reports an episode of shortness of breath that was so severe it prompted an emergency room visit. She had an appointment scheduled for the following day, but felt she could not wait due to the severity of her symptoms. The patient denies any other pain or problems. She is currently taking Tagrisso and reports no issues  with the medication.         All other systems were reviewed with the patient and are negative.  MEDICAL HISTORY:  Past Medical History:  Diagnosis Date   Adenocarcinoma (HCC)    recurrent/notes 07/03/2017   Blind left eye 10/2013   cva   Breast cancer (HCC) 05/2014   ER+/PR+/Her2-     LUNG CANCER   Bundle branch block left    Chest pain    Family history of bladder cancer    Family history of breast cancer    Family history of prostate cancer    GERD (gastroesophageal reflux disease)    History of blood transfusion    age 59   Hyperlipidemia    Hypertension    Hypothyroid    Lung nodule    Melanoma in situ of face (HCC) JUNE 2015   LEFT CHEEK   Meningioma (HCC)    brain lining   Migraine    reports only having one   Osteoarthritis    Personal history of radiation therapy    Radiation 09/16/14-10/07/14   Right  Breast  21 fractions   Skin cancer    Stroke (HCC) 2015   Blind in left eye as a result    SURGICAL HISTORY: Past Surgical History:  Procedure Laterality Date   ABDOMINAL HYSTERECTOMY  ~ 48   APPENDECTOMY     age  12   BILATERAL SALPINGOOPHORECTOMY     BLADDER REPAIR  2009   cystocele   BRAIN MENINGIOMA EXCISION     Gamma knife to Meningioma in brain lining   BREAST BIOPSY     BREAST LUMPECTOMY Right 07/23/2014   invasive ductal   BUNIONECTOMY     bilateral great toe   CARDIAC CATHETERIZATION  2004   CHOLECYSTECTOMY  ~ 1997   COLONOSCOPY     CRYO INTERCOSTAL NERVE BLOCK Left 11/25/2014   Procedure: CRYO INTERCOSTAL NERVE BLOCK;  Surgeon: Loreli Slot, MD;  Location: [facility] OR;  Service: Thoracic;  Laterality: Left;   CT RADIATION THERAPY GUIDE     Gamma radiation -lt frontal-[facility]   DILATION AND CURETTAGE OF UTERUS     X 2   HIP ARTHROPLASTY Left 07/04/2017   Procedure: ARTHROPLASTY BIPOLAR HIP (HEMIARTHROPLASTY);  Surgeon: Jones Broom, MD;  Location[facility] OR;  Service: Orthopedics;  Laterality: Left;   IR THORACENTESIS ASP PLEURAL SPACE  W/IMG GUIDE  02/27/2023   MELANOMA EXCISION Left 11/05/23, 11/30/13   CHEEK   SEGMENTECOMY Left 11/25/2014   Procedure: LEFT LOWER LOBE SEGMENTECTOMY;  Surgeon: Loreli Slot, MD;  Location: [facility] OR;  Service: Thoracic;  Laterality: Left;   VIDEO ASSISTED THORACOSCOPY Left 11/25/2014   Procedure: VIDEO ASSISTED THORACOSCOPY;  Surgeon: Loreli Slot, MD;  Location: [facility] OR;  Service: Thoracic;  Laterality: Left;   VIDEO BRONCHOSCOPY WITH ENDOBRONCHIAL NAVIGATION N/A 06/19/2017   Procedure: VIDEO BRONCHOSCOPY;  Surgeon: Loreli Slot, MD;  Location: [facility] OR;  Service: Thoracic;  Laterality: N/A;   VIDEO BRONCHOSCOPY WITH ENDOBRONCHIAL ULTRASOUND N/A 11/25/2014   Procedure: VIDEO BRONCHOSCOPY WITH ENDOBRONCHIAL ULTRASOUND;  Surgeon: Loreli Slot, MD;  Location: [facility] OR;  Service: Thoracic;  Laterality: N/A;    I have reviewed the social history and family history with the patient and they are unchanged from previous note.  ALLERGIES:  is allergic to beef-derived drug products, other, chocolate, ibuprofen, niacin and related, and tramadol.  MEDICATIONS:  Current Outpatient Medications  Medication Sig Dispense Refill   aspirin 81 MG EC tablet      atenolol (TENORMIN) 50 MG tablet Take 50 mg by mouth at bedtime.      b complex vitamins capsule Take 1 capsule by mouth daily.     BINAXNOW COVID-19 AG HOME TEST KIT See admin instructions.     calcium carbonate (OSCAL) 1500 (600 Ca) MG TABS tablet      calcium-vitamin D (OSCAL WITH D) 500-200 MG-UNIT per tablet Take 1 tablet by mouth at bedtime.      Cholecalciferol (VITAMIN D3) 1000 units CAPS Take by mouth daily.     co-enzyme Q-10 30 MG capsule Take 30 mg by mouth daily.     denosumab (PROLIA) 60 MG/ML SOSY injection      Digestive Enzymes (ENZYME DIGEST PO) Take 5 mLs by mouth 3 (three) times daily with meals.     FLUZONE HIGH-DOSE QUADRIVALENT 0.7 ML SUSY      levothyroxine (SYNTHROID, LEVOTHROID) 112 MCG tablet Take 112 mcg  by mouth daily before breakfast.      lisinopril (PRINIVIL,ZESTRIL) 10 MG tablet Take 1 tablet (10 mg total) by mouth daily. (Patient taking differently: Take 10 mg by mouth 2 (two) times daily.) 30 tablet 1   magic mouthwash (nystatin, lidocaine, diphenhydrAMINE, alum & mag hydroxide) suspension Swish and swallow 5 mLs by mouth 3 (three) times daily as needed for mouth pain. 140 mL 1   meclizine (ANTIVERT) 12.5  MG tablet Take 12.5 mg by mouth 3 (three) times daily as needed for dizziness.     Multiple Vitamin (MULTIVITAMIN) tablet Take 1 tablet by mouth every morning.      Omega-3 1000 MG CAPS Take 1 g by mouth daily.     osimertinib mesylate (TAGRISSO) 40 MG tablet Take 1 tablet (40 mg total) by mouth daily. 30 tablet 1   Triamcinolone Acetonide (NASACORT AQ NA) Place 1 spray into the nose at bedtime as needed (CONGESTION).     vitamin E 180 MG (400 UNITS) capsule Take 400 Units by mouth every Monday, Wednesday, and Friday. Mon Wed Fri     No current facility-administered medications for this visit.    PHYSICAL EXAMINATION: ECOG PERFORMANCE STATUS: 2 - Symptomatic, <50% confined to bed  Vitals:   05/15/23 0824  BP: (!) 142/69  Pulse: 90  Resp: 15  Temp: 97.7 F (36.5 C)  SpO2: 98%   Wt Readings from Last 3 Encounters:  05/15/23 107 lb (48.5 kg)  05/05/23 108 lb (49 kg)  03/20/23 109 lb (49.4 kg)     GENERAL:alert, no distress and comfortable SKIN: skin color, texture, turgor are normal, no rashes or significant lesions EYES: normal, Conjunctiva are pink and non-injected, sclera clear NECK: supple, thyroid normal size, non-tender, without nodularity LYMPH:  no palpable lymphadenopathy in the cervical, axillary  LUNGS: clear to auscultation and percussion with normal breathing effort HEART: regular rate & rhythm and no murmurs and no lower extremity edema ABDOMEN:abdomen soft, non-tender and normal bowel sounds Musculoskeletal:no cyanosis of digits and no clubbing  NEURO:  alert & oriented x 3 with fluent speech, no focal motor/sensory deficits   LABORATORY DATA:  I have reviewed the data as listed    Latest Ref Rng & Units 05/05/2023    8:33 AM 05/05/2023    8:17 AM 03/20/2023   10:40 AM  CBC  WBC 4.0 - 10.5 K/uL  5.2  3.4   Hemoglobin 12.0 - 15.0 g/dL 01.0  27.2  53.6   Hematocrit 36.0 - 46.0 % 36.0  36.9  34.8   Platelets 150 - 400 K/uL  87  86         Latest Ref Rng & Units 05/05/2023    8:33 AM 03/20/2023   10:40 AM 02/22/2023    8:31 PM  CMP  Glucose 70 - 99 mg/dL 92  644  034   BUN 8 - 23 mg/dL 18  22  23    Creatinine 0.44 - 1.00 mg/dL 7.42  5.95  6.38   Sodium 135 - 145 mmol/L 139  138  138   Potassium 3.5 - 5.1 mmol/L 4.1  4.2  4.2   Chloride 98 - 111 mmol/L 101  101  103   CO2 22 - 32 mmol/L  28  28   Calcium 8.9 - 10.3 mg/dL  8.9  9.4   Total Protein 6.5 - 8.1 g/dL  6.7  7.1   Total Bilirubin 0.3 - 1.2 mg/dL  0.6  0.6   Alkaline Phos 38 - 126 U/L  66  72   AST 15 - 41 U/L  29  24   ALT 0 - 44 U/L  28  24       RADIOGRAPHIC STUDIES: I have personally reviewed the radiological images as listed and agreed with the findings in the report. No results found.    No orders of the defined types were placed in this encounter.  All questions were answered.  The patient knows to call the clinic with any problems, questions or concerns. No barriers to learning was detected. The total time spent in the appointment was 25 minutes.     Malachy Mood, MD 05/15/2023

## 2023-05-17 ENCOUNTER — Other Ambulatory Visit: Payer: Self-pay

## 2023-05-20 ENCOUNTER — Encounter: Payer: Self-pay | Admitting: Internal Medicine

## 2023-05-22 ENCOUNTER — Encounter (HOSPITAL_COMMUNITY): Payer: Self-pay | Admitting: Emergency Medicine

## 2023-05-22 ENCOUNTER — Observation Stay (HOSPITAL_COMMUNITY): Payer: Medicare HMO

## 2023-05-22 ENCOUNTER — Observation Stay (HOSPITAL_COMMUNITY)
Admission: EM | Admit: 2023-05-22 | Discharge: 2023-05-25 | Disposition: A | Discharge status: Home / Self Care | Payer: Medicare HMO | Attending: Family Medicine | Admitting: Family Medicine

## 2023-05-22 ENCOUNTER — Emergency Department (HOSPITAL_COMMUNITY): Payer: Medicare HMO

## 2023-05-22 ENCOUNTER — Other Ambulatory Visit: Payer: Self-pay

## 2023-05-22 DIAGNOSIS — Z85118 Personal history of other malignant neoplasm of bronchus and lung: Secondary | ICD-10-CM | POA: Diagnosis not present

## 2023-05-22 DIAGNOSIS — Z853 Personal history of malignant neoplasm of breast: Secondary | ICD-10-CM | POA: Insufficient documentation

## 2023-05-22 DIAGNOSIS — I10 Essential (primary) hypertension: Secondary | ICD-10-CM | POA: Diagnosis not present

## 2023-05-22 DIAGNOSIS — Z7982 Long term (current) use of aspirin: Secondary | ICD-10-CM | POA: Diagnosis not present

## 2023-05-22 DIAGNOSIS — E43 Unspecified severe protein-calorie malnutrition: Secondary | ICD-10-CM | POA: Diagnosis not present

## 2023-05-22 DIAGNOSIS — E871 Hypo-osmolality and hyponatremia: Secondary | ICD-10-CM | POA: Insufficient documentation

## 2023-05-22 DIAGNOSIS — J984 Other disorders of lung: Secondary | ICD-10-CM | POA: Diagnosis not present

## 2023-05-22 DIAGNOSIS — C7800 Secondary malignant neoplasm of unspecified lung: Secondary | ICD-10-CM | POA: Insufficient documentation

## 2023-05-22 DIAGNOSIS — Z85828 Personal history of other malignant neoplasm of skin: Secondary | ICD-10-CM | POA: Insufficient documentation

## 2023-05-22 DIAGNOSIS — Z8673 Personal history of transient ischemic attack (TIA), and cerebral infarction without residual deficits: Secondary | ICD-10-CM | POA: Insufficient documentation

## 2023-05-22 DIAGNOSIS — E039 Hypothyroidism, unspecified: Secondary | ICD-10-CM | POA: Insufficient documentation

## 2023-05-22 DIAGNOSIS — Z79899 Other long term (current) drug therapy: Secondary | ICD-10-CM | POA: Diagnosis not present

## 2023-05-22 DIAGNOSIS — Z96642 Presence of left artificial hip joint: Secondary | ICD-10-CM | POA: Insufficient documentation

## 2023-05-22 DIAGNOSIS — J189 Pneumonia, unspecified organism: Principal | ICD-10-CM | POA: Diagnosis present

## 2023-05-22 DIAGNOSIS — R531 Weakness: Secondary | ICD-10-CM | POA: Diagnosis not present

## 2023-05-22 DIAGNOSIS — R918 Other nonspecific abnormal finding of lung field: Secondary | ICD-10-CM | POA: Diagnosis not present

## 2023-05-22 DIAGNOSIS — J90 Pleural effusion, not elsewhere classified: Secondary | ICD-10-CM | POA: Diagnosis not present

## 2023-05-22 LAB — COMPREHENSIVE METABOLIC PANEL
ALT: 28 U/L (ref 0–44)
AST: 30 U/L (ref 15–41)
Albumin: 3.6 g/dL (ref 3.5–5.0)
Alkaline Phosphatase: 67 U/L (ref 38–126)
Anion gap: 8 (ref 5–15)
BUN: 25 mg/dL — ABNORMAL HIGH (ref 8–23)
CO2: 25 mmol/L (ref 22–32)
Calcium: 8.5 mg/dL — ABNORMAL LOW (ref 8.9–10.3)
Chloride: 98 mmol/L (ref 98–111)
Creatinine, Ser: 0.63 mg/dL (ref 0.44–1.00)
GFR, Estimated: >60 mL/min (ref >60)
Glucose, Bld: 96 mg/dL (ref 70–99)
Potassium: 4 mmol/L (ref 3.5–5.1)
Sodium: 131 mmol/L — ABNORMAL LOW (ref 135–145)
Total Bilirubin: 0.7 mg/dL (ref <1.2)
Total Protein: 6.6 g/dL (ref 6.5–8.1)

## 2023-05-22 LAB — TROPONIN I (HIGH SENSITIVITY)
Troponin I (High Sensitivity): 6 ng/L (ref <18)
Troponin I (High Sensitivity): 6 ng/L (ref <18)

## 2023-05-22 LAB — CBC WITH DIFFERENTIAL/PLATELET
Abs Immature Granulocytes: 0.01 10*3/uL (ref 0.00–0.07)
Basophils Absolute: 0 10*3/uL (ref 0.0–0.1)
Basophils Relative: 1 %
Eosinophils Absolute: 0.3 10*3/uL (ref 0.0–0.5)
Eosinophils Relative: 8 %
HCT: 34.6 % — ABNORMAL LOW (ref 36.0–46.0)
Hemoglobin: 11.3 g/dL — ABNORMAL LOW (ref 12.0–15.0)
Immature Granulocytes: 0 %
Lymphocytes Relative: 9 %
Lymphs Abs: 0.3 10*3/uL — ABNORMAL LOW (ref 0.7–4.0)
MCH: 29.2 pg (ref 26.0–34.0)
MCHC: 32.7 g/dL (ref 30.0–36.0)
MCV: 89.4 fL (ref 80.0–100.0)
Monocytes Absolute: 0.5 10*3/uL (ref 0.1–1.0)
Monocytes Relative: 13 %
Neutro Abs: 2.5 10*3/uL (ref 1.7–7.7)
Neutrophils Relative %: 69 %
Platelets: 110 10*3/uL — ABNORMAL LOW (ref 150–400)
RBC: 3.87 MIL/uL (ref 3.87–5.11)
RDW: 13.5 % (ref 11.5–15.5)
WBC: 3.6 10*3/uL — ABNORMAL LOW (ref 4.0–10.5)
nRBC: 0 % (ref 0.0–0.2)

## 2023-05-22 LAB — URINALYSIS, ROUTINE W REFLEX MICROSCOPIC
Bacteria, UA: NONE SEEN
Bilirubin Urine: NEGATIVE
Glucose, UA: NEGATIVE mg/dL
Hgb urine dipstick: NEGATIVE
Ketones, ur: NEGATIVE mg/dL
Nitrite: NEGATIVE
Protein, ur: NEGATIVE mg/dL
Specific Gravity, Urine: 1.01 (ref 1.005–1.030)
pH: 5 (ref 5.0–8.0)

## 2023-05-22 MED ORDER — MELATONIN 3 MG PO TABS: 6.0000 mg | ORAL_TABLET | Freq: Every evening | ORAL | Status: DC | PRN

## 2023-05-22 MED ORDER — ALBUTEROL SULFATE (2.5 MG/3ML) 0.083% IN NEBU: 2.5000 mg | INHALATION_SOLUTION | RESPIRATORY_TRACT | Status: DC | PRN

## 2023-05-22 MED ORDER — SODIUM CHLORIDE 0.9 % IV SOLN
1.0000 g | Freq: Once | INTRAVENOUS | Status: AC
  Administered 2023-05-22: 1 g via INTRAVENOUS
  Filled 2023-05-22: qty 10

## 2023-05-22 MED ORDER — POLYETHYLENE GLYCOL 3350 17 G PO PACK: 17.0000 g | PACK | Freq: Every day | ORAL | Status: DC | PRN

## 2023-05-22 MED ORDER — ATENOLOL 50 MG PO TABS
50.0000 mg | ORAL_TABLET | Freq: Every day | ORAL | Status: DC
Start: 2023-05-22 — End: 2023-05-25
  Administered 2023-05-23 – 2023-05-24 (×2): 50 mg via ORAL
  Filled 2023-05-22: qty 2
  Filled 2023-05-22: qty 1

## 2023-05-22 MED ORDER — ACETAMINOPHEN 500 MG PO TABS: 1000.0000 mg | ORAL_TABLET | Freq: Four times a day (QID) | ORAL | Status: DC | PRN

## 2023-05-22 MED ORDER — SODIUM CHLORIDE 0.9 % IV SOLN
500.0000 mg | Freq: Once | INTRAVENOUS | Status: AC
  Administered 2023-05-22: 500 mg via INTRAVENOUS
  Filled 2023-05-22: qty 5

## 2023-05-22 MED ORDER — DOXYCYCLINE HYCLATE 100 MG PO TABS: 100.0000 mg | ORAL_TABLET | Freq: Two times a day (BID) | ORAL | Status: DC

## 2023-05-22 MED ORDER — ENOXAPARIN SODIUM 40 MG/0.4ML IJ SOSY
40.0000 mg | PREFILLED_SYRINGE | INTRAMUSCULAR | Status: DC
  Administered 2023-05-23 – 2023-05-24 (×2): 40 mg via SUBCUTANEOUS
  Filled 2023-05-22 (×2): qty 0.4

## 2023-05-22 MED ORDER — CEFTRIAXONE SODIUM 1 G IJ SOLR: 1.0000 g | INTRAMUSCULAR | Status: DC

## 2023-05-22 MED ORDER — OXYCODONE HCL 5 MG PO TABS: 2.5000 mg | ORAL_TABLET | Freq: Four times a day (QID) | ORAL | Status: DC | PRN

## 2023-05-22 MED ORDER — LEVOTHYROXINE SODIUM 112 MCG PO TABS
112.0000 ug | ORAL_TABLET | Freq: Every day | ORAL | Status: DC
  Administered 2023-05-23 – 2023-05-25 (×3): 112 ug via ORAL
  Filled 2023-05-22 (×3): qty 1

## 2023-05-22 NOTE — ED Provider Notes (Signed)
[place] EMERGENCY DEPARTMENT AT [facility] Provider Note   CSN: [id number] Arrival date & time: 05/22/23  1814     History  Chief Complaint  Patient presents with   Weakness    Melissa Snyder is a 89 y.o. female who presents emergency department with chief complaint of generalized fatigue.  Patient states that she has lung cancer and on 3 December underwent thoracentesis where they removed close to a liter of fluid from her lungs.  She reports that she has been breathing well since that time however has been overwhelmingly fatigued.  She does state that she has poor appetite however she knows she needs to eat and schedules meals including 2 ensures daily.  She also reports that she make sure she is drinking multiple ounces of fluid a day.  She denies fevers, chills, cough, chest pain but she has been having some intermittent epigastric burning pain and fullness.  This comes and goes.   Weakness      Home Medications Prior to Admission medications   Medication Sig Start Date End Date Taking? Authorizing Provider  aspirin 81 MG EC tablet  05/09/09   [provider]  atenolol (TENORMIN) 50 MG tablet Take 50 mg by mouth at bedtime.     [provider]  b complex vitamins capsule Take 1 capsule by mouth daily.    [provider]  Sherrie Sport COVID-19 AG HOME TEST KIT See admin instructions. 02/13/21   [provider]  calcium carbonate (OSCAL) 1500 (600 Ca) MG TABS tablet  04/25/11   [provider]  calcium-vitamin D (OSCAL WITH D) 500-200 MG-UNIT per tablet Take 1 tablet by mouth at bedtime.     [provider]  Cholecalciferol (VITAMIN D3) 1000 units CAPS Take by mouth daily.    [provider]  co-enzyme Q-10 30 MG capsule Take 30 mg by mouth daily.    [provider]  denosumab (PROLIA) 60 MG/ML SOSY injection  11/03/15   [provider]  Digestive Enzymes (ENZYME DIGEST PO) Take 5 mLs by  mouth 3 (three) times daily with meals.    [provider]  FLUZONE HIGH-DOSE QUADRIVALENT 0.7 ML SUSY  03/02/21   [provider]  levothyroxine (SYNTHROID, LEVOTHROID) 112 MCG tablet Take 112 mcg by mouth daily before breakfast.     [provider]  lisinopril (PRINIVIL,ZESTRIL) 10 MG tablet Take 1 tablet (10 mg total) by mouth daily. Patient taking differently: Take 10 mg by mouth 2 (two) times daily. 11/29/14   Rowe Clack, PA-C  magic mouthwash (nystatin, lidocaine, diphenhydrAMINE, alum & mag hydroxide) suspension Swish and swallow 5 mLs by mouth 3 (three) times daily as needed for mouth pain. 08/10/22   Malachy Mood, MD  meclizine (ANTIVERT) 12.5 MG tablet Take 12.5 mg by mouth 3 (three) times daily as needed for dizziness.    [provider]  Multiple Vitamin (MULTIVITAMIN) tablet Take 1 tablet by mouth every morning.     [provider]  Omega-3 1000 MG CAPS Take 1 g by mouth daily.    [provider]  osimertinib mesylate (TAGRISSO) 40 MG tablet Take 1 tablet (40 mg total) by mouth daily. 04/02/23   Malachy Mood, MD  Triamcinolone Acetonide (NASACORT AQ NA) Place 1 spray into the nose at bedtime as needed (CONGESTION).    [provider]  vitamin E 180 MG (400 UNITS) capsule Take 400 Units by mouth every Monday, Wednesday, and Friday. Mon Wed Fri  [provider]      Allergies    Beef-derived drug products, Other, Chocolate, Ibuprofen, Niacin and related, and Tramadol    Review of Systems   Review of Systems  Neurological:  Positive for weakness.    Physical Exam Updated Vital Signs BP (!) 142/72   Pulse 84   Temp 98 F (36.7 C)   Resp 16   Ht 5\' 5"  (1.651 m)   Wt 50 kg   SpO2 99%   BMI 18.34 kg/m  Physical Exam Vitals and nursing note reviewed.  Constitutional:      General: She is not in acute distress.    Appearance: She is well-developed. She is not diaphoretic.  HENT:     Head: Normocephalic and  atraumatic.     Right Ear: External ear normal.     Left Ear: External ear normal.     Nose: Nose normal.     Mouth/Throat:     Mouth: Mucous membranes are dry.  Eyes:     General: No scleral icterus.    Extraocular Movements: Extraocular movements intact.     Conjunctiva/sclera: Conjunctivae normal.     Pupils: Pupils are equal, round, and reactive to light.  Cardiovascular:     Rate and Rhythm: Normal rate and regular rhythm.     Heart sounds: Normal heart sounds. No murmur heard.    No friction rub. No gallop.  Pulmonary:     Effort: Pulmonary effort is normal. No respiratory distress.     Breath sounds: Normal breath sounds.  Abdominal:     General: Bowel sounds are normal. There is no distension.     Palpations: Abdomen is soft. There is no mass.     Tenderness: There is no abdominal tenderness. There is no guarding.  Musculoskeletal:     Cervical back: Normal range of motion.  Skin:    General: Skin is warm and dry.  Neurological:     Mental Status: She is alert and oriented to person, place, and time.  Psychiatric:        Behavior: Behavior normal.    ED Results / Procedures / Treatments   Labs (all labs ordered are listed, but only abnormal results are displayed) Labs Reviewed  COMPREHENSIVE METABOLIC PANEL  CBC WITH DIFFERENTIAL/PLATELET  URINALYSIS, ROUTINE W REFLEX MICROSCOPIC  TROPONIN I (HIGH SENSITIVITY)    EKG None  Radiology No results found.  Procedures Procedures    Medications Ordered in ED Medications - No data to display  ED Course/ Medical Decision Making/ A&P                                 Medical Decision Making Amount and/or Complexity of Data Reviewed Labs: ordered. Radiology: ordered. ECG/medicine tests: ordered.  Risk Decision regarding hospitalization.   This patient presents to the ED for concern of weakness, this involves an extensive number of treatment options, and is a complaint that carries with it a high risk of  complications and morbidity.  The differential diagnosis of weakness includes but is not limited to neurologic causes (GBS, myasthenia gravis, CVA, MS, ALS, transverse myelitis, spinal cord injury, CVA, botulism, ) and other causes: ACS, Arrhythmia, syncope, orthostatic hypotension, sepsis, hypoglycemia, electrolyte disturbance, hypothyroidism, respiratory failure, symptomatic anemia, dehydration, heat injury, polypharmacy, malignancy.   Co morbidities: cancer, recent thoracentesis  Social Determinants of Health:   SDOH Screenings   Food Insecurity: No Food Insecurity (05/24/2023)  Housing: Low Risk  (05/24/2023)  Transportation Needs: No Transportation Needs (05/24/2023)  Utilities: Not At Risk (05/24/2023)  Tobacco Use: Low Risk  (05/22/2023)     Additional history:  {Additional history obtained from husband, Velda Shell   Lab Tests:  I Ordered, and personally interpreted labs.  The pertinent results include:  mild pancytopenia UA negative Mild hyponatremia insignificant to work up Troponin is negative Imaging Studies:  I ordered imaging studies including chest x-ray I independently visualized and interpreted imaging which showed patchy opacities concerning for pneumonia I agree with the radiologist interpretation  Cardiac Monitoring/ECG:  The patient was maintained on a cardiac monitor.  I personally viewed and interpreted the cardiac monitored which showed an underlying rhythm of: Sinus rhythm  Medicines ordered and prescription drug management:  I ordered medication including  Medications  enoxaparin (LOVENOX) injection 40 mg (has no administration in time range)  acetaminophen (TYLENOL) tablet 1,000 mg (has no administration in time range)  oxyCODONE (Oxy IR/ROXICODONE) immediate release tablet 2.5 mg (has no administration in time range)  polyethylene glycol (MIRALAX / GLYCOLAX) packet 17 g (has no administration in time range)  melatonin tablet 6 mg (has no  administration in time range)  atenolol (TENORMIN) tablet 50 mg (50 mg Oral Not Given 05/22/23 2359)  levothyroxine (SYNTHROID) tablet 112 mcg (112 mcg Oral Given 05/23/23 0839)  albuterol (PROVENTIL) (2.5 MG/3ML) 0.083% nebulizer solution 2.5 mg (has no administration in time range)  aspirin EC tablet 81 mg (has no administration in time range)  cefTRIAXone (ROCEPHIN) 1 g in sodium chloride 0.9 % 100 mL IVPB (0 g Intravenous Stopped 05/23/23 0139)  azithromycin (ZITHROMAX) 500 mg in sodium chloride 0.9 % 250 mL IVPB (0 mg Intravenous Stopped 05/23/23 0139)   for suspicious pneumonia Reevaluation of the patient after these medicines showed that the patient stayed the same I have reviewed the patients home medicines and have made adjustments as needed  Test Considered:    Critical Interventions:    Consultations Obtained:   Problem List / ED Course:     ICD-10-CM   1. Community acquired pneumonia, unspecified laterality  J18.9       MDM: Signout given to PA Browning at shift handoff patient will need admission.   Dispostion:  After consideration of the diagnostic results and the patients response to treatment, I feel that the patent would benefit from ADMISSION.         Final Clinical Impression(s) / ED Diagnoses Final diagnoses:  None    Rx / DC Orders ED Discharge Orders     None         Arthor Captain, PA-C 05/26/23 1356    Gerhard Munch, MD 05/27/23 [phone]

## 2023-05-22 NOTE — ED Provider Notes (Signed)
Patient reassessed.  Feels very run down and weak.  She has had cough.  Hx of lung cancer.  Questionable pneumonia.  This might contribute to her new weakness.  Seen by and discussed with Dr. Jeraldine Loots, not thought to be a good candidate for outpatient treatment.  Will consult hospitalist for admission.  I discussed the case with Dr. Lazarus Salines, who is appreciated for evaluating the patient.   Roxy Horseman, PA-C 05/22/23 2329    Gerhard Munch, MD 05/25/23 [phone]

## 2023-05-22 NOTE — ED Notes (Signed)
Pt attempted to use to restroom to obtain urine sample, but pt was unsuccessful.

## 2023-05-22 NOTE — ED Triage Notes (Signed)
Patient c/o weakness x 2 weeks. Patient report she had her thoracentesis last December 1 and she's been getting weaker since the procedure. Patient denies N/V. Patient a/ox4.

## 2023-05-23 DIAGNOSIS — R531 Weakness: Secondary | ICD-10-CM

## 2023-05-23 DIAGNOSIS — R627 Adult failure to thrive: Secondary | ICD-10-CM

## 2023-05-23 LAB — BASIC METABOLIC PANEL
Anion gap: 9 (ref 5–15)
BUN: 21 mg/dL (ref 8–23)
CO2: 27 mmol/L (ref 22–32)
Calcium: 8.8 mg/dL — ABNORMAL LOW (ref 8.9–10.3)
Chloride: 101 mmol/L (ref 98–111)
Creatinine, Ser: 0.64 mg/dL (ref 0.44–1.00)
GFR, Estimated: >60 mL/min (ref >60)
Glucose, Bld: 94 mg/dL (ref 70–99)
Potassium: 3.9 mmol/L (ref 3.5–5.1)
Sodium: 137 mmol/L (ref 135–145)

## 2023-05-23 LAB — RESPIRATORY PANEL BY PCR

## 2023-05-23 LAB — CBC
HCT: 33.1 % — ABNORMAL LOW (ref 36.0–46.0)
Hemoglobin: 11.2 g/dL — ABNORMAL LOW (ref 12.0–15.0)
MCH: 29.9 pg (ref 26.0–34.0)
MCHC: 33.8 g/dL (ref 30.0–36.0)
MCV: 88.5 fL (ref 80.0–100.0)
Platelets: 116 10*3/uL — ABNORMAL LOW (ref 150–400)
RBC: 3.74 MIL/uL — ABNORMAL LOW (ref 3.87–5.11)
RDW: 13.5 % (ref 11.5–15.5)
WBC: 3.5 10*3/uL — ABNORMAL LOW (ref 4.0–10.5)
nRBC: 0 % (ref 0.0–0.2)

## 2023-05-23 LAB — VITAMIN B12: Vitamin B-12: 575 pg/mL (ref 180–914)

## 2023-05-23 LAB — TSH: TSH: 4.282 u[IU]/mL (ref 0.350–4.500)

## 2023-05-23 LAB — PHOSPHORUS: Phosphorus: 3.1 mg/dL (ref 2.5–4.6)

## 2023-05-23 LAB — MAGNESIUM: Magnesium: 2.2 mg/dL (ref 1.7–2.4)

## 2023-05-23 MED ORDER — ASPIRIN 81 MG PO TBEC
81.0000 mg | DELAYED_RELEASE_TABLET | Freq: Every day | ORAL | Status: DC
  Administered 2023-05-23 – 2023-05-25 (×3): 81 mg via ORAL
  Filled 2023-05-23 (×3): qty 1

## 2023-05-23 MED ORDER — LISINOPRIL 10 MG PO TABS
10.0000 mg | ORAL_TABLET | Freq: Every day | ORAL | Status: DC
  Administered 2023-05-23 – 2023-05-25 (×3): 10 mg via ORAL
  Filled 2023-05-23 (×3): qty 1

## 2023-05-23 NOTE — Progress Notes (Signed)
Subjective: Patient admitted this morning, see detailed H&P by Segars 89 y.o. female with hx of metastatic lung adenocarcinoma, malignant effusion, history of prior resection, radiation, currently on EGFR inhibitor, recent thoracentesis 12/1, hypertension, hypothyroidism, who presents due to progressive weakness.  Reports that over the past 2-1/2 weeks since her thoracentesis she has had progressive and generalized weakness that has not abated   Vitals:   05/23/23 0621 05/23/23 0715  BP:  (!) 150/70  Pulse:  77  Resp:  (!) 22  Temp: 98.1 F (36.7 C)   SpO2:  97%      A/P Generalized weakness / deconditioning  History of dysphagia, decreased intake History 2 and half weeks of progressive weakness.  Suspect mainly this is related to EGFR inhibitor versus her underlying malignancy, decreased nutritional intake /dehydration.  -Stop antibiotics doubt this is community-acquired pneumonia -B12 575 -SLP evaluation for dysphagia -Continue oral hydration as able -PT/OT evaluation   Mild hyponatremia Suspect hypovolemic with low solute. Encourage PO intake. Repeat BMP outpatient   Goals of care: -Current wishes are to be DNR/DNI, confirmed with the patient.    Metastatic adeno carcinoma of lung, malignant effusion: Effusion appears stable since last thoracentesis.  Resume home EGFR inhibitor when able and continue outpatient follow-up.  Hypertension: Continue home atenolol.  Will restart lisinopril 10 mg daily   Hypothyroidism: Continue home levothyroxine, check TSH 4.282  ? ASCVD: Continue aspirin 81 mg daily     Meredeth Ide [facility]

## 2023-05-23 NOTE — Progress Notes (Addendum)
1210 Pt arrived from er via wheelchair,  no acute distress noted at this time   pt calm in bed  completing admission assessment at this time. Redness on sacrum, not open protective aquacell in place   Will continue to assess pt through this shift   1933-   pt calm resting in bed  no acute distress noted at this time  safety measures in place  call bell within reach  handoff report completed with shalini Rn at bedside

## 2023-05-23 NOTE — Plan of Care (Signed)

## 2023-05-23 NOTE — Progress Notes (Signed)
Clinical/Bedside Swallow Evaluation Patient Details  Name: Melissa Snyder MRN: [id number] Date of Birth: 26-Apr-1934  Today's Date: 05/23/2023 Time: SLP Start Time (ACUTE ONLY): 0943 SLP Stop Time (ACUTE ONLY): 1016 SLP Time Calculation (min) (ACUTE ONLY): 33 min  Past Medical History:  Past Medical History:  Diagnosis Date   Adenocarcinoma (HCC)    recurrent/notes 07/03/2017   Blind left eye 10/2013   cva   Breast cancer (HCC) 05/2014   ER+/PR+/Her2-     LUNG CANCER   Bundle branch block left    Chest pain    Family history of bladder cancer    Family history of breast cancer    Family history of prostate cancer    GERD (gastroesophageal reflux disease)    History of blood transfusion    age 68   Hyperlipidemia    Hypertension    Hypothyroid    Lung nodule    Melanoma in situ of face (HCC) JUNE 2015   LEFT CHEEK   Meningioma (HCC)    brain lining   Migraine    reports only having one   Osteoarthritis    Personal history of radiation therapy    Radiation 09/16/14-10/07/14   Right  Breast  21 fractions   Skin cancer    Stroke (HCC) 2015   Blind in left eye as a result   Past Surgical History:  Past Surgical History:  Procedure Laterality Date   ABDOMINAL HYSTERECTOMY  ~ 1997   APPENDECTOMY     age 26   BILATERAL SALPINGOOPHORECTOMY     BLADDER REPAIR  2009   cystocele   BRAIN MENINGIOMA EXCISION     Gamma knife to Meningioma in brain lining   BREAST BIOPSY     BREAST LUMPECTOMY Right 07/23/2014   invasive ductal   BUNIONECTOMY     bilateral great toe   CARDIAC CATHETERIZATION  2004   CHOLECYSTECTOMY  ~ 1997   COLONOSCOPY     CRYO INTERCOSTAL NERVE BLOCK Left 11/25/2014   Procedure: CRYO INTERCOSTAL NERVE BLOCK;  Surgeon: Loreli Slot, MD;  Location: [facility] OR;  Service: Thoracic;  Laterality: Left;   CT RADIATION THERAPY GUIDE     Gamma radiation -lt frontal-[facility]   DILATION AND CURETTAGE OF UTERUS     X 2   HIP ARTHROPLASTY Left 07/04/2017    Procedure: ARTHROPLASTY BIPOLAR HIP (HEMIARTHROPLASTY);  Surgeon: Jones Broom, MD;  Location: [facility] OR;  Service: Orthopedics;  Laterality: Left;   IR THORACENTESIS ASP PLEURAL SPACE W/IMG GUIDE  02/27/2023   MELANOMA EXCISION Left 11/05/23, 11/30/13   CHEEK   SEGMENTECOMY Left 11/25/2014   Procedure: LEFT LOWER LOBE SEGMENTECTOMY;  Surgeon: Loreli Slot, MD;  Location: [facility] OR;  Service: Thoracic;  Laterality: Left;   VIDEO ASSISTED THORACOSCOPY Left 11/25/2014   Procedure: VIDEO ASSISTED THORACOSCOPY;  Surgeon: Loreli Slot, MD;  Location: [facility] OR;  Service: Thoracic;  Laterality: Left;   VIDEO BRONCHOSCOPY WITH ENDOBRONCHIAL NAVIGATION N/A 06/19/2017   Procedure: VIDEO BRONCHOSCOPY;  Surgeon: Loreli Slot, MD;  Location: [facility] OR;  Service: Thoracic;  Laterality: N/A;   VIDEO BRONCHOSCOPY WITH ENDOBRONCHIAL ULTRASOUND N/A 11/25/2014   Procedure: VIDEO BRONCHOSCOPY WITH ENDOBRONCHIAL ULTRASOUND;  Surgeon: Loreli Slot, MD;  Location: [facility] OR;  Service: Thoracic;  Laterality: N/A;   HPI:  pt is an 89 yo female adm to [facility] with progressive weakness, hypothyroidism, HTN, and CXR showed mild patchy opacities in the LU and lower lobe suspicious for pna.  Associated small left  pleural effusion.  Swallow eval ordered.  CT ordered - pt has h/o stage IV lung cancer s/p radiation and surgery.  She has also required thoracentesis.    Assessment / Plan / Recommendation  Clinical Impression  Patient presents with functional oropharyngeal swallow ability. NO clinical indication of aspiration or dysphagia with all po intake (breakfast).  No focal CN deficits present and pt's swallow subjectively timely.  She consumed oatmeal, pancakes, orange juice and milk during the session.   She does report occasional issues of sensing food sticking in upper part of her throat - which sensation clears with liquids. She will later cough up the food - without accompanied secretions. Advised her to  alternative postures to consider for preventing retention. Also advised she maintain strength of her "hock" - forced expectoration to clear vallecular region of solids PRN.     She reports some occasional issues with swallowing pills - that she cuts to take. Denies expectorating pills however.  No SLP follow up indicated.  Of note, pt has h/o severe reflux *states she had undergone scoping approx 30 years ago and was refluxing "24/7" and was placed on a reflux medication *H2 blocker or PPI* 40 mg BID. She later stopped this medication and treats her GERD OTC with enzymes.    SLP Visit Diagnosis: Dysphagia, unspecified (R13.10)    Aspiration Risk  Mild aspiration risk    Diet Recommendation Regular;Thin liquid    Liquid Administration via: Cup;Straw Medication Administration: Whole meds with liquid Supervision: Patient able to self feed Compensations: Slow rate;Small sips/bites Postural Changes: Remain upright for at least 30 minutes after po intake;Seated upright at 90 degrees    Other  Recommendations Oral Care Recommendations: Oral care BID    Recommendations for follow up therapy are one component of a multi-disciplinary discharge planning process, led by the attending physician.  Recommendations may be updated based on patient status, additional functional criteria and insurance authorization.  Follow up Recommendations No SLP follow up      Assistance Recommended at Discharge  N/a  Functional Status Assessment Patient has not had a recent decline in their functional status  Frequency and Duration     N/a       Prognosis   N/a     Swallow Study   General Date of Onset: 05/23/23 HPI: pt is an 89 yo female adm to [facility] with progressive weakness, hypothyroidism, HTN, and CXR showed mild patchy opacities in the LU and lower lobe suspicious for pna.  Associated small left pleural effusion.  Swallow eval ordered.  CT ordered - pt has h/o stage IV lung cancer s/p radiation and  surgery.  She has also required thoracentesis. Type of Study: Bedside Swallow Evaluation Diet Prior to this Study: Regular;Thin liquids (Level 0) Temperature Spikes Noted: No Respiratory Status: Nasal cannula History of Recent Intubation: No Behavior/Cognition: Alert;Cooperative;Pleasant mood Oral Cavity Assessment: Within Functional Limits Oral Care Completed by SLP: No Oral Cavity - Dentition: Adequate natural dentition Vision: Functional for self-feeding Self-Feeding Abilities: Able to feed self Patient Positioning: Upright in bed Baseline Vocal Quality: Normal Volitional Cough: Strong Volitional Swallow: Able to elicit    Oral/Motor/Sensory Function Overall Oral Motor/Sensory Function: Within functional limits   Ice Chips Ice chips: Not tested   Thin Liquid Thin Liquid: Within functional limits Presentation: Cup    Nectar Thick Nectar Thick Liquid: Not tested   Honey Thick Honey Thick Liquid: Not tested   Puree Puree: Within functional limits Presentation: Spoon;Self Fed   Solid  Solid: Within functional limits Presentation: Self Fed      Chales Abrahams 05/23/2023,10:51 AM  Rolena Infante, MS [facility] SLP Acute Rehab Services Office [phone]

## 2023-05-23 NOTE — H&P (Signed)
History and Physical    Melissa Snyder:[id number] DOB: [birth year] DOA: 05/22/2023  PCP: Rodrigo Ran, MD   Patient coming from: Home   Chief Complaint:  Chief Complaint  Patient presents with   Weakness    HPI:  Melissa Snyder is a 89 y.o. female with hx of metastatic lung adenocarcinoma, malignant effusion, history of prior resection, radiation, currently on EGFR inhibitor, recent thoracentesis 12/1, hypertension, hypothyroidism, who presents due to progressive weakness.  Reports that over the past 2-1/2 weeks since her thoracentesis she has had progressive and generalized weakness that has not abated.  Reports decreased p.o. intake.  She otherwise denies fevers, chills, she had an episode of cough last week with yellow sputum, other than this episode she has not had any significant cough.  No other URI symptoms.  No sick contacts.  Denies any chest pain or palpitations.    Review of Systems:  ROS complete and negative except as marked above   Allergies  Allergen Reactions   Beef-Derived Drug Products Other (See Comments)    ACID REFLUX   Other     FRIED FOODS UNSPECIFIED REACTION    Chocolate Other (See Comments)    Acid Reflux   Ibuprofen Rash and Other (See Comments)    Causes mouth sores   Niacin And Related Other (See Comments)    Flushing, burning, redness > REACTIONS NOT ALLERGIC    Tramadol Itching    Prior to Admission medications   Medication Sig Start Date End Date Taking? Authorizing Provider  aspirin 81 MG EC tablet  05/09/09   [provider]  atenolol (TENORMIN) 50 MG tablet Take 50 mg by mouth at bedtime.     [provider]  b complex vitamins capsule Take 1 capsule by mouth daily.    [provider]  Sherrie Sport COVID-19 AG HOME TEST KIT See admin instructions. 02/13/21   [provider]  calcium carbonate (OSCAL) 1500 (600 Ca) MG TABS tablet  04/25/11   [provider]  calcium-vitamin D (OSCAL WITH D)  500-200 MG-UNIT per tablet Take 1 tablet by mouth at bedtime.     [provider]  Cholecalciferol (VITAMIN D3) 1000 units CAPS Take by mouth daily.    [provider]  co-enzyme Q-10 30 MG capsule Take 30 mg by mouth daily.    [provider]  denosumab (PROLIA) 60 MG/ML SOSY injection  11/03/15   [provider]  Digestive Enzymes (ENZYME DIGEST PO) Take 5 mLs by mouth 3 (three) times daily with meals.    [provider]  FLUZONE HIGH-DOSE QUADRIVALENT 0.7 ML SUSY  03/02/21   [provider]  levothyroxine (SYNTHROID, LEVOTHROID) 112 MCG tablet Take 112 mcg by mouth daily before breakfast.     [provider]  lisinopril (PRINIVIL,ZESTRIL) 10 MG tablet Take 1 tablet (10 mg total) by mouth daily. Patient taking differently: Take 10 mg by mouth 2 (two) times daily. 11/29/14   Rowe Clack, PA-C  magic mouthwash (nystatin, lidocaine, diphenhydrAMINE, alum & mag hydroxide) suspension Swish and swallow 5 mLs by mouth 3 (three) times daily as needed for mouth pain. 08/10/22   Malachy Mood, MD  meclizine (ANTIVERT) 12.5 MG tablet Take 12.5 mg by mouth 3 (three) times daily as needed for dizziness.    [provider]  Multiple Vitamin (MULTIVITAMIN) tablet Take 1 tablet by mouth every morning.     [provider]  Omega-3 1000 MG CAPS Take 1 g by mouth  daily.    [provider]  osimertinib mesylate (TAGRISSO) 40 MG tablet Take 1 tablet (40 mg total) by mouth daily. 04/02/23   Malachy Mood, MD  Triamcinolone Acetonide (NASACORT AQ NA) Place 1 spray into the nose at bedtime as needed (CONGESTION).    [provider]  vitamin E 180 MG (400 UNITS) capsule Take 400 Units by mouth every Monday, Wednesday, and Friday. Mon Wed Fri    [provider]    Past Medical History:  Diagnosis Date   Adenocarcinoma (HCC)    recurrent/notes 07/03/2017   Blind left eye 10/2013   cva   Breast cancer (HCC) 05/2014    ER+/PR+/Her2-     LUNG CANCER   Bundle branch block left    Chest pain    Family history of bladder cancer    Family history of breast cancer    Family history of prostate cancer    GERD (gastroesophageal reflux disease)    History of blood transfusion    age 23   Hyperlipidemia    Hypertension    Hypothyroid    Lung nodule    Melanoma in situ of face (HCC) JUNE 2015   LEFT CHEEK   Meningioma (HCC)    brain lining   Migraine    reports only having one   Osteoarthritis    Personal history of radiation therapy    Radiation 09/16/14-10/07/14   Right  Breast  21 fractions   Skin cancer    Stroke (HCC) 2015   Blind in left eye as a result    Past Surgical History:  Procedure Laterality Date   ABDOMINAL HYSTERECTOMY  ~ 1997   APPENDECTOMY     age 59   BILATERAL SALPINGOOPHORECTOMY     BLADDER REPAIR  2009   cystocele   BRAIN MENINGIOMA EXCISION     Gamma knife to Meningioma in brain lining   BREAST BIOPSY     BREAST LUMPECTOMY Right 07/23/2014   invasive ductal   BUNIONECTOMY     bilateral great toe   CARDIAC CATHETERIZATION  2004   CHOLECYSTECTOMY  ~ 1997   COLONOSCOPY     CRYO INTERCOSTAL NERVE BLOCK Left 11/25/2014   Procedure: CRYO INTERCOSTAL NERVE BLOCK;  Surgeon: Loreli Slot, MD;  Location: [facility] OR;  Service: Thoracic;  Laterality: Left;   CT RADIATION THERAPY GUIDE     Gamma radiation -lt frontal-[facility]   DILATION AND CURETTAGE OF UTERUS     X 2   HIP ARTHROPLASTY Left 07/04/2017   Procedure: ARTHROPLASTY BIPOLAR HIP (HEMIARTHROPLASTY);  Surgeon: Jones Broom, MD;  Location: [facility] OR;  Service: Orthopedics;  Laterality: Left;   IR THORACENTESIS ASP PLEURAL SPACE W/IMG GUIDE  02/27/2023   MELANOMA EXCISION Left 11/05/23, 11/30/13   CHEEK   SEGMENTECOMY Left 11/25/2014   Procedure: LEFT LOWER LOBE SEGMENTECTOMY;  Surgeon: Loreli Slot, MD;  Location: [facility] OR;  Service: Thoracic;  Laterality: Left;   VIDEO ASSISTED THORACOSCOPY Left 11/25/2014    Procedure: VIDEO ASSISTED THORACOSCOPY;  Surgeon: Loreli Slot, MD;  Location: [facility] OR;  Service: Thoracic;  Laterality: Left;   VIDEO BRONCHOSCOPY WITH ENDOBRONCHIAL NAVIGATION N/A 06/19/2017   Procedure: VIDEO BRONCHOSCOPY;  Surgeon: Loreli Slot, MD;  Location: [facility] OR;  Service: Thoracic;  Laterality: N/A;   VIDEO BRONCHOSCOPY WITH ENDOBRONCHIAL ULTRASOUND N/A 11/25/2014   Procedure: VIDEO BRONCHOSCOPY WITH ENDOBRONCHIAL ULTRASOUND;  Surgeon: Loreli Slot, MD;  Location: [facility] OR;  Service: Thoracic;  Laterality: N/A;  reports that she has never smoked. She has never used smokeless tobacco. She reports that she does not drink alcohol and does not use drugs.  Family History  Problem Relation Age of Onset   CAD Mother        CABG   Hyperlipidemia Mother    AAA (abdominal aortic aneurysm) Mother    Renal Disease Father    Kidney disease Father    CAD Father    Cancer Father        PROSTATE   Diabetes Brother    Hyperlipidemia Brother    Breast cancer Daughter 89   Breast cancer Sister 11   Breast cancer Sister 63   Cancer Brother        NOS   Bladder Cancer Brother 37   Lung cancer Cousin        3 paternal cousins with lung cancer   Breast cancer Cousin        five paternal first cousins   Cancer Cousin        4 paternal first cousins with Cancer NOS   Cancer Cousin        1 paternal cousin with oral cancer (tongue)     Physical Exam: Vitals:   05/23/23 0115 05/23/23 0240 05/23/23 0245 05/23/23 0415  BP:  (!) 140/66 136/62 (!) 129/57  Pulse: 79 77 75 69  Resp: (!) 23 18 (!) 23 (!) 25  Temp:  97.7 F (36.5 C)    TempSrc:      SpO2: 97% 97% 96% 95%  Weight:      Height:        Gen: Awake, alert, chronically ill-appearing, cachectic CV: Regular (following episode of SVT), normal S1, soft S2. 1/6 SEM.  Resp: Normal WOB, Diminished thoroughout L lower lung field, few rales there.  Abd: scaphoid, normoactive, nontender MSK: Symmetric, no edema   Skin: No rashes or lesions to exposed skin  Neuro: Alert and interactive  Psych: euthymic, appropriate    Data review:   Labs reviewed, notable for:   NA 131 other chemistries unremarkable WBC 3.6 cell count stable  Micro:  Results for orders placed or performed during the hospital encounter of 05/22/23  Respiratory (~20 pathogens) panel by PCR     Status: None   Collection Time: 05/22/23 12:03 AM   Specimen: Nasopharyngeal Swab; Respiratory  Result Value Ref Range Status   Adenovirus NOT DETECTED NOT DETECTED Corrected   Coronavirus 229E NOT DETECTED NOT DETECTED Corrected    Comment: (NOTE) The Coronavirus on the Respiratory Panel, DOES NOT test for the novel  Coronavirus (2019 nCoV) CORRECTED ON 12/19 AT 0428: PREVIOUSLY REPORTED AS NOT DETECTED    Coronavirus HKU1 NOT DETECTED NOT DETECTED Corrected   Coronavirus NL63 NOT DETECTED NOT DETECTED Corrected   Coronavirus OC43 NOT DETECTED NOT DETECTED Corrected   Metapneumovirus NOT DETECTED NOT DETECTED Corrected   Rhinovirus / Enterovirus NOT DETECTED NOT DETECTED Corrected   Influenza A NOT DETECTED NOT DETECTED Corrected   Influenza B NOT DETECTED NOT DETECTED Corrected   Parainfluenza Virus 1 NOT DETECTED NOT DETECTED Corrected   Parainfluenza Virus 2 NOT DETECTED NOT DETECTED Corrected   Parainfluenza Virus 3 NOT DETECTED NOT DETECTED Corrected   Parainfluenza Virus 4 NOT DETECTED NOT DETECTED Corrected   Respiratory Syncytial Virus NOT DETECTED NOT DETECTED Corrected   Bordetella pertussis NOT DETECTED NOT DETECTED Corrected   Bordetella Parapertussis NOT DETECTED NOT DETECTED Corrected   Chlamydophila pneumoniae NOT DETECTED NOT DETECTED Corrected  Mycoplasma pneumoniae NOT DETECTED NOT DETECTED Corrected    Comment: Performed at [facility], [address], [place], [place] [address]    Imaging reviewed:  CT CHEST WO CONTRAST Result Date: 05/23/2023 CLINICAL DATA:  History of lung cancer concern for  worsening malignancy or effusion, weakness EXAM: CT CHEST WITHOUT CONTRAST TECHNIQUE: Multidetector CT imaging of the chest was performed following the standard protocol without IV contrast. RADIATION DOSE REDUCTION: This exam was performed according to the departmental dose-optimization program which includes automated exposure control, adjustment of the mA and/or kV according to patient size and/or use of iterative reconstruction technique. COMPARISON:  Chest x-ray 05/22/2023, PET CT 05/10/2023, chest CT 05/05/2023 FINDINGS: Cardiovascular: Limited assessment without intravenous contrast. Advanced aortic atherosclerosis. No aneurysm. Coronary vascular calcification. Normal cardiac size. No pericardial effusion mitral calcification. Mediastinum/Nodes: Patent trachea. No suspicious thyroid mass. No increased adenopathy. Esophagus within normal limits. Lungs/Pleura: Biapical pleural and parenchymal nodular scarring. Postsurgical changes of the left lung with stable bandlike density along the staple line. Small moderate left pleural effusion, decreased compared to chest CT from 12/01, probably stable compared with PET CT on 12/06. Subpleural scarring and fibrosis in the anterior right upper lobe also without change. Scattered small ground-glass nodules, for example series 4, image 40, grossly unchanged compared to recent priors. Upper Abdomen: No acute finding. Musculoskeletal: Degenerative changes. No acute osseous abnormality. IMPRESSION: 1. Postsurgical changes of the left lung with stable bandlike density along the staple line. Small to moderate left pleural effusion, decreased compared to chest CT from 12/01, probably stable compared with PET CT on 12/06. No definite acute pulmonary airspace disease is seen. 2. Scattered small ground-glass nodules, grossly unchanged compared to recent priors. Stable apical and anterior right upper lobe pulmonary scarring. 3. Aortic atherosclerosis. Aortic Atherosclerosis  (ICD10-I70.0). Electronically Signed   By: Jasmine Pang M.D.   On: 05/23/2023 00:25   DG Chest 2 View Result Date: 05/22/2023 CLINICAL DATA:  Weakness, recent thoracentesis, history of cancer EXAM: CHEST - 2 VIEW COMPARISON:  PET-CT dated 05/10/2023 FINDINGS: Mild patchy opacities in the left upper and lower lobes, suspicious for pneumonia. Scarring/postsurgical changes in the left mid lung. Associated small left pleural effusion. Heart is normal in size.  No pericardial effusion. Mild degenerative changes of the visualized thoracolumbar spine. IMPRESSION: Mild patchy opacities in the left upper and lower lobes, suspicious for pneumonia. Associated small left pleural effusion. Electronically Signed   By: Charline Bills M.D.   On: 05/22/2023 22:49    EKG:  Sinus rhythm with left bundle branch block, similar to prior  Telemetry: Reviewed, alarms with tachycardia actually normal rate with T wave read as ectopic beat.   ED Course:  Felt to have possible community-acquired pneumonia leading to her symptoms, treated with ceftriaxone and azithromycin.  On my initial evaluation bedside tele reading as 140 bpm, unable to capture on EKG, on review of telemetry does not appear truly having SVT. Automated read is recording her T wave as an ectopic beat.    Assessment/Plan:  89 y.o. female with hx metastatic lung adenocarcinoma, malignant effusion, history of prior resection, radiation, currently on EGFR inhibitor, recent thoracentesis 12/1, hypertension, hypothyroidism, who presents due to progressive weakness.   Generalized weakness / deconditioning  History of dysphagia, decreased intake History 2 and half weeks of progressive weakness.  Suspect mainly this is related to EGFR inhibitor versus her underlying malignancy, decreased nutritional intake /dehydration. Doubt this is truly community-acquired pneumonia I had reviewed the chest x-ray and  ordered CT chest to further evaluate and there is no  definite airspace disease seen and she has stable effusion, symptoms are not consistent with a pneumonia, white blood cell count is borderline low.  -Stop antibiotics doubt this is community-acquired pneumonia -Check TSH, B12 -SLP evaluation for dysphagia -Continue oral hydration as able -PT/OT evaluation  Mild hyponatremia Suspect hypovolemic with low solute. Encourage PO intake. Repeat BMP outpatient  Goals of care: -Current wishes are to be DNR/DNI, confirmed with the patient.  Chronic medical problems: Metastatic adeno carcinoma of lung, malignant effusion: Effusion appears stable since last thoracentesis.  Resume home EGFR inhibitor when able and continue outpatient follow-up. Hypertension: Continue home atenolol.  Hold home lisinopril for now as blood pressure reasonably well-controlled without.  Hypothyroidism: Continue home levothyroxine, check TSH ? ASCVD: Continue aspirin 81 mg daily   Body mass index is 18.34 kg/m.    DVT prophylaxis:  Lovenox Code Status:  DNR/DNI(Do NOT Intubate); confirmed with patient  Diet:  Diet Orders (From admission, onward)     Start     Ordered   05/22/23 2339  Diet regular Room service appropriate? Yes; Fluid consistency: Thin  Diet effective now       Question Answer Comment  Room service appropriate? Yes   Fluid consistency: Thin      05/22/23 2339           Family Communication:  No   Consults:  None   Admission status:   Observation, Med-Surg  Severity of Illness: The appropriate patient status for this patient is OBSERVATION. Observation status is judged to be reasonable and necessary in order to provide the required intensity of service to ensure the patient's safety. The patient's presenting symptoms, physical exam findings, and initial radiographic and laboratory data in the context of their medical condition is felt to place them at decreased risk for further clinical deterioration. Furthermore, it is anticipated that the  patient will be medically stable for discharge from the hospital within 2 midnights of admission.    Dolly Rias, MD [facility]  How to contact the [facility] Attending or Consulting provider 7A - 7P or covering provider during after hours 7P -7A, for this patient.  Check the care team in [facility] and look for a) attending/consulting [facility] provider listed and b) the [facility] team listed Log into [email or web address] and use [place]'s universal password to access. If you do not have the password, please contact the hospital operator. Locate the [facility] provider you are looking for under [facility] and page to a number that you can be directly reached. If you still have difficulty reaching the provider, please page the [facility] (Director on Call) for the Hospitalists listed on amion for assistance.  05/23/2023, 6:01 AM

## 2023-05-23 NOTE — Evaluation (Signed)
Occupational Therapy Evaluation Patient Details Name: LEONDA COSMA MRN: [id number] DOB: 1933/07/04 Today's Date: 05/23/2023   History of Present Illness ROSHON SELMAN is a 89 y.o. female presents due to progressive weakness x 2 weeks after her thoracentesis.  PHMx: metastatic lung adenocarcinoma, malignant effusion, history of prior resection, radiation, recent thoracentesis 12/1, hypertension, hypothyroidism.   Clinical Impression   This 89 yo female admitted with above presents to acute OT with PLOF of being independent to Mod I with all basic ADLs without need for assistive device but now is weak and feels a bit unbalanced when she is up on her feet (feels need to hold onto something). She has a RW at home and so I recommended she use that for now until she feels safer when up and about. I did mention to her a shower seat and gave her info on a program called [employer]. No further OT needs, we will sign off.       If plan is discharge home, recommend the following: Assistance with cooking/housework;Assist for transportation;Help with stairs or ramp for entrance    Functional Status Assessment  Patient has had a recent decline in their functional status and demonstrates the ability to make significant improvements in function in a reasonable and predictable amount of time. (without further skilled OT needed)  Equipment Recommendations  None recommended by OT       Precautions / Restrictions Precautions Precautions: Fall Restrictions Weight Bearing Restrictions Per Provider Order: No      Mobility Bed Mobility Overal bed mobility: Modified Independent                  Transfers Overall transfer level: Modified independent                 General transfer comment: a little tentative when standing and and ambulating (feels better holding onto furniture)      Balance Overall balance assessment: Mild deficits observed, not formally tested                                          ADL either performed or assessed with clinical judgement   ADL Overall ADL's : Modified independent                                       General ADL Comments: Did recommend to patient that she use a RW for now since she does not feel as balanced or strong as she has been; she reports she feels safe standing in shower to bath when talkign about a possible shower seat. I also gave her information on the [employer] program in [place] that helps with home modifications so seniors can stay in their homes.     Vision Patient Visual Report: No change from baseline              Pertinent Vitals/Pain Pain Assessment Pain Assessment: No/denies pain     Extremity/Trunk Assessment Upper Extremity Assessment Upper Extremity Assessment: Overall WFL for tasks assessed              Cognition Arousal: Alert Behavior During Therapy: WFL for tasks assessed/performed Overall Cognitive Status: Within Functional Limits for tasks assessed  Home Living Family/patient expects to be discharged to:: Private residence Living Arrangements: Spouse/significant other Available Help at Discharge: Family;Available 24 hours/day Type of Home: House Home Access: Stairs to enter [employer] of Steps: 1 Entrance Stairs-Rails: None Home Layout: One level     Bathroom Shower/Tub: Tub/shower unit;Curtain   Firefighter: Standard     Home Equipment: Agricultural consultant (2 wheels);Cane - single point;Grab bars - tub/shower   Additional Comments: spouse is 69 and still drives      Prior Functioning/Environment Prior Level of Function : Independent/Modified Independent;Driving                        OT Problem List: Impaired balance (sitting and/or standing)         OT Goals(Current goals can be found in the care plan section) Acute Rehab  OT Goals Patient Stated Goal: to continue to feel better and go home         AM-PAC OT "6 Clicks" Daily Activity     Outcome Measure Help from another person eating meals?: None Help from another person taking care of personal grooming?: A Little Help from another person toileting, which includes using toliet, bedpan, or urinal?: A Little Help from another person bathing (including washing, rinsing, drying)?: A Little Help from another person to put on and taking off regular upper body clothing?: A Little Help from another person to put on and taking off regular lower body clothing?: A Little 6 Click Score: 19   End of Session Equipment Utilized During Treatment: Gait belt  Activity Tolerance: Patient tolerated treatment well Patient left: in bed;with call bell/phone within reach  OT Visit Diagnosis: Other abnormalities of gait and mobility (R26.89)                Time: 6578-4696 OT Time Calculation (min): 25 min Charges:  OT General Charges $OT Visit: 1 Visit OT Evaluation $OT Eval Moderate Complexity: 1 Mod OT Treatments $Self Care/Home Management : 8-22 mins  Lindon Romp OT Acute Rehabilitation Services Office [phone]    Evette Georges 05/23/2023, 3:20 PM

## 2023-05-23 NOTE — Progress Notes (Signed)
Pt is A&O x 4. Vss, on room air. Ambulates to bathroom with stand by assist. Medications given as ordered. Educated on falls and safety precautions, call bell, plan of care, pt verbalizes understanding.  Bed alarm on. Call bell in reach. Will continue to monitor.

## 2023-05-23 NOTE — Progress Notes (Signed)
Mobility Specialist - Progress Note   05/23/23 1613  Mobility  Activity Ambulated with assistance in hallway  Level of Assistance Contact guard assist, steadying assist  Assistive Device Other (Comment) (HHA)  Distance Ambulated (ft) 40 ft  Activity Response Tolerated well  Mobility Referral Yes  Mobility visit 1 Mobility   Pt agreeable to mobilize this afternoon. She states she does not use a RW, so HHA was provided. Pt would reach for objects and rails while ambulating and was encouraged to use RW. Pt returned to bed at EOS and was left sitting up to finish lunch. Call light left within reach and all needs met.   Arliss Journey Mobility Specialist Acute Rehabilitation Services Phone: [phone] 05/23/23, 4:15 PM

## 2023-05-24 DIAGNOSIS — R531 Weakness: Secondary | ICD-10-CM | POA: Diagnosis not present

## 2023-05-24 DIAGNOSIS — R627 Adult failure to thrive: Secondary | ICD-10-CM | POA: Diagnosis not present

## 2023-05-24 DIAGNOSIS — E43 Unspecified severe protein-calorie malnutrition: Secondary | ICD-10-CM | POA: Diagnosis not present

## 2023-05-24 DIAGNOSIS — J189 Pneumonia, unspecified organism: Secondary | ICD-10-CM | POA: Diagnosis not present

## 2023-05-24 LAB — COMPREHENSIVE METABOLIC PANEL
ALT: 24 U/L (ref 0–44)
AST: 29 U/L (ref 15–41)
Albumin: 3.2 g/dL — ABNORMAL LOW (ref 3.5–5.0)
Alkaline Phosphatase: 66 U/L (ref 38–126)
Anion gap: 8 (ref 5–15)
BUN: 19 mg/dL (ref 8–23)
CO2: 24 mmol/L (ref 22–32)
Calcium: 8.6 mg/dL — ABNORMAL LOW (ref 8.9–10.3)
Chloride: 102 mmol/L (ref 98–111)
Creatinine, Ser: 0.7 mg/dL (ref 0.44–1.00)
GFR, Estimated: >60 mL/min (ref >60)
Glucose, Bld: 94 mg/dL (ref 70–99)
Potassium: 4 mmol/L (ref 3.5–5.1)
Sodium: 134 mmol/L — ABNORMAL LOW (ref 135–145)
Total Bilirubin: 0.7 mg/dL (ref <1.2)
Total Protein: 6 g/dL — ABNORMAL LOW (ref 6.5–8.1)

## 2023-05-24 LAB — CBC
HCT: 35.1 % — ABNORMAL LOW (ref 36.0–46.0)
Hemoglobin: 11.2 g/dL — ABNORMAL LOW (ref 12.0–15.0)
MCH: 28.9 pg (ref 26.0–34.0)
MCHC: 31.9 g/dL (ref 30.0–36.0)
MCV: 90.7 fL (ref 80.0–100.0)
Platelets: 121 10*3/uL — ABNORMAL LOW (ref 150–400)
RBC: 3.87 MIL/uL (ref 3.87–5.11)
RDW: 13.5 % (ref 11.5–15.5)
WBC: 3.2 10*3/uL — ABNORMAL LOW (ref 4.0–10.5)
nRBC: 0 % (ref 0.0–0.2)

## 2023-05-24 NOTE — Progress Notes (Signed)
[facility]  PROGRESS NOTE  Melissa Snyder MVH:[id number] DOB: [birth year] DOA: 05/22/2023 PCP: Rodrigo Ran, MD   Brief HPI:    89 y.o. female with hx of metastatic lung adenocarcinoma, malignant effusion, history of prior resection, radiation, currently on EGFR inhibitor, recent thoracentesis 12/1, hypertension, hypothyroidism, who presents due to progressive weakness.  Reports that over the past 2-1/2 weeks since her thoracentesis she has had progressive and generalized weakness that has not abated    Assessment/Plan:   Generalized weakness / deconditioning  History of dysphagia, decreased intake History 2 and half weeks of progressive weakness.  Suspect mainly this is related to EGFR inhibitor versus her underlying malignancy, decreased nutritional intake /dehydration.  -Stop antibiotics doubt this is community-acquired pneumonia -B12 575 -SLP evaluation obtained for dysphagia, started on regular diet -Continue oral hydration as able -PT/OT evaluation   Mild hyponatremia Suspect hypovolemic with low solute. Encourage PO intake.  -Sodium is 134 today   Goals of care: -Current wishes are to be DNR/DNI, confirmed with the patient.     Metastatic adeno carcinoma of lung, malignant effusion: Effusion appears stable since last thoracentesis.   -EGFR inhibitor is currently on hold, will resume at discharge   Hypertension: Continue home atenolol, lisinopril 10 mg daily    Hypothyroidism: Continue home levothyroxine, checked TSH 4.282   ? ASCVD: Continue aspirin 81 mg daily     Medications     aspirin EC  81 mg Oral Daily   atenolol  50 mg Oral QHS   enoxaparin (LOVENOX) injection  40 mg Subcutaneous Q24H   levothyroxine  112 mcg Oral Q0600   lisinopril  10 mg Oral Daily     Data Reviewed:   CBG:  No results for input(s): "GLUCAP" in the last 168 hours.  SpO2: 97 %    Vitals:   05/23/23 2107 05/24/23 0017 05/24/23 0500 05/24/23 0504  BP: 120/67 (!) 126/59   122/61  Pulse: 85 75  72  Resp: 16 16  16   Temp:  97.7 F (36.5 C)  97.9 F (36.6 C)  TempSrc:  Oral  Oral  SpO2: 97% 97%  97%  Weight:   48 kg   Height:          Data Reviewed:  Basic Metabolic Panel: Recent Labs  Lab 05/22/23 2022 05/23/23 0445 05/24/23 0426  NA 131* 137 134*  K 4.0 3.9 4.0  CL 98 101 102  CO2 25 27 24   GLUCOSE 96 94 94  BUN 25* 21 19  CREATININE 0.63 0.64 0.70  CALCIUM 8.5* 8.8* 8.6*  MG  --  2.2  --   PHOS  --  3.1  --     CBC: Recent Labs  Lab 05/22/23 2022 05/23/23 0445 05/24/23 0426  WBC 3.6* 3.5* 3.2*  NEUTROABS 2.5  --   --   HGB 11.3* 11.2* 11.2*  HCT 34.6* 33.1* 35.1*  MCV 89.4 88.5 90.7  PLT 110* 116* 121*    LFT Recent Labs  Lab 05/22/23 2022 05/24/23 0426  AST 30 29  ALT 28 24  ALKPHOS 67 66  BILITOT 0.7 0.7  PROT 6.6 6.0*  ALBUMIN 3.6 3.2*     Antibiotics: Anti-infectives (From admission, onward)    Start     Dose/Rate Route Frequency Ordered Stop   05/23/23 2300  cefTRIAXone (ROCEPHIN) 1 g in sodium chloride 0.9 % 100 mL IVPB  Status:  Discontinued        1 g 200 mL/hr over 30 Minutes  Intravenous Every 24 hours 05/22/23 2337 05/23/23 0601   05/23/23 2200  doxycycline (VIBRA-TABS) tablet 100 mg  Status:  Discontinued        100 mg Oral Every 12 hours 05/22/23 2337 05/23/23 0601   05/22/23 2315  cefTRIAXone (ROCEPHIN) 1 g in sodium chloride 0.9 % 100 mL IVPB        1 g 200 mL/hr over 30 Minutes Intravenous  Once 05/22/23 2305 05/23/23 0139   05/22/23 2315  azithromycin (ZITHROMAX) 500 mg in sodium chloride 0.9 % 250 mL IVPB        500 mg 250 mL/hr over 60 Minutes Intravenous  Once 05/22/23 2305 05/23/23 0139        DVT prophylaxis: Lovenox  Code Status: DNR  Family Communication:    CONSULTS    Subjective   Feels better this morning.   Objective    Physical Examination:   General-appears in no acute distress Heart-S1-S2, regular, no murmur auscultated Lungs-clear to auscultation  bilaterally, no wheezing or crackles auscultated Abdomen-soft, nontender, no organomegaly Extremities-no edema in the lower extremities Neuro-alert, oriented x3, no focal deficit noted   Status is: Inpatient:             Meredeth Ide   [facility] If 7PM-7AM, please contact night-coverage at [email or web address], Office  [phone]   05/24/2023, 7:39 AM  LOS: 0 days

## 2023-05-24 NOTE — Progress Notes (Addendum)
1049-Pt explained to this nurse that at times she will get " fluttery, palpations  and wonders if this may be why she can become sob at home.  This information relayed to MD [place], md to tx pt to tele for cardiac monitoring for pt concern.  Pt is not having these feelings at this time  and is in no distress at this time  will continue to monitor until tx is completed  1415- pt calm and transported to 3 surgical   all belonging taken with pt.  No acute distress noted   safety measures in place  handoff report given to melody westbrooks Rn

## 2023-05-24 NOTE — Plan of Care (Signed)

## 2023-05-24 NOTE — Progress Notes (Signed)
Initial Nutrition Assessment  DOCUMENTATION CODES:   Severe malnutrition in context of chronic illness, Underweight  INTERVENTION:   -Patient can request Ensure from unit if desired -Patient to resume Boost High Protein at home  -Encourage PO intakes  NUTRITION DIAGNOSIS:   Severe Malnutrition related to chronic illness, cancer and cancer related treatments as evidenced by severe fat depletion, severe muscle depletion.  GOAL:   Patient will meet greater than or equal to 90% of their needs  MONITOR:   PO intake, Labs, Weight trends, I & O's  REASON FOR ASSESSMENT:   Consult Assessment of nutrition requirement/status  ASSESSMENT:   89 y.o. female with hx of metastatic lung adenocarcinoma, malignant effusion, history of prior resection, radiation, currently on EGFR inhibitor, recent thoracentesis 12/1, hypertension, hypothyroidism, who presents due to progressive weakness.  Reports that over the past 2-1/2 weeks since her thoracentesis she has had progressive and generalized weakness that has not abated  Patient in room, somewhat HOH. Pt reports she ate a good breakfast this morning of oatmeal, banana, OJ. Doesn't eat beef, chocolate or fried foods d/t her GERD. Drinks vanilla Boost High Protein at home, about 2 daily. Doesn't want Ensure here and we only have chocolate Boost available. Encouraged pt to request vanilla Ensure from unit if feels she needs a protein supplement. States she is eating well here as meals are being supplied 3 times daily. She will resume her Boost once she gets home.   Per weight records, pt's weight has been trending down since 2019. No significant weight changes noted but pt underweight so any weight loss is concerning.  Medications reviewed.  Labs reviewed: Low Na   NUTRITION - FOCUSED PHYSICAL EXAM:  Flowsheet Row Most Recent Value  Orbital Region Severe depletion  Upper Arm Region Severe depletion  Thoracic and Lumbar Region Severe  depletion  Buccal Region Severe depletion  Temple Region Severe depletion  Clavicle Bone Region Severe depletion  Clavicle and Acromion Bone Region Severe depletion  Scapular Bone Region Severe depletion  Dorsal Hand Severe depletion  Patellar Region Severe depletion  Anterior Thigh Region Severe depletion  Posterior Calf Region Severe depletion  Edema (RD Assessment) None  Hair Reviewed  Eyes Reviewed  Mouth Reviewed  Skin Reviewed  Nails Reviewed       Diet Order:   Diet Order             Diet regular Room service appropriate? Yes; Fluid consistency: Thin  Diet effective now                   EDUCATION NEEDS:   No education needs have been identified at this time  Skin:  Skin Assessment: Reviewed RN Assessment  Last BM:  12/20 -type 3  Height:   Ht Readings from Last 1 Encounters:  05/22/23 5\' 5"  (1.651 m)    Weight:   Wt Readings from Last 1 Encounters:  05/24/23 48 kg    BMI:  Body mass index is 17.61 kg/m.  Estimated Nutritional Needs:   Kcal:  1500-1700  Protein:  65-80g  Fluid:  1.7L/day  Tilda Franco, MS, RD, LDN Inpatient Clinical Dietitian Contact via Secure chat

## 2023-05-24 NOTE — Care Management Obs Status (Signed)
MEDICARE OBSERVATION STATUS NOTIFICATION   Patient Details  Name: Melissa Snyder MRN: [id number] Date of Birth: 1934/02/15   Medicare Observation Status Notification Given:  Yes    Adrian Prows, RN 05/24/2023, 10:38 AM

## 2023-05-24 NOTE — Evaluation (Signed)
Physical Therapy Evaluation Patient Details Name: Melissa Snyder MRN: [id number] DOB: [birth year] Today's Date: 05/24/2023  History of Present Illness  Pt is 89 yo female admitted on 05/22/23 with progressive weakness for 2 weeks since her thoracentesis.  Pt with mild hyponatremia.  Pt with hx of thoracentesis on 12/1, HTN, hypothyroidism, metastatic lung adenocarcinoma, malignant effusion, history of prior resection, radiation, currently on EGFR inhibitor  Clinical Impression  Pt admitted with above diagnosis. At baseline, pt is ambulatory without AD.  Today, pt ambulating 81' with CGA and with hand rail.  She presented with generalized weakness and unsteadiness.  Pt is below her baseline and will benefit from PT.   Pt currently with functional limitations due to the deficits listed below (see PT Problem List). Pt will benefit from acute skilled PT to increase their independence and safety with mobility to allow discharge.           If plan is discharge home, recommend the following: A little help with walking and/or transfers;A little help with bathing/dressing/bathroom;Assistance with cooking/housework;Help with stairs or ramp for entrance   Can travel by private vehicle        Equipment Recommendations Rollator (4 wheels)  Recommendations for Other Services       Functional Status Assessment Patient has had a recent decline in their functional status and demonstrates the ability to make significant improvements in function in a reasonable and predictable amount of time.     Precautions / Restrictions Precautions Precautions: Fall      Mobility  Bed Mobility Overal bed mobility: Modified Independent                  Transfers Overall transfer level: Needs assistance   Transfers: Sit to/from Stand Sit to Stand: Supervision           General transfer comment: held bed rail    Ambulation/Gait Ambulation/Gait assistance: Contact guard assist Gait Distance  (Feet): 60 Feet Assistive device:  (handrail) Gait Pattern/deviations: Step-to pattern, Decreased stride length Gait velocity: decreased     General Gait Details: Slow and cautious pattern using hand rail/door frame/or bed  Stairs            Wheelchair Mobility     Tilt Bed    Modified Rankin (Stroke Patients Only)       Balance Overall balance assessment: Needs assistance Sitting-balance support: No upper extremity supported Sitting balance-Leahy Scale: Good     Standing balance support: No upper extremity supported, Single extremity supported Standing balance-Leahy Scale: Fair Standing balance comment: could stand without support but preferred holding something wtih ambulation                             Pertinent Vitals/Pain Pain Assessment Pain Assessment: No/denies pain    Home Living Family/patient expects to be discharged to:: Private residence Living Arrangements: Spouse/significant other Available Help at Discharge: Family;Available 24 hours/day Type of Home: House Home Access: Stairs to enter Entrance Stairs-Rails: None Entrance Stairs-Number of Steps: 1   Home Layout: One level Home Equipment: Agricultural consultant (2 wheels);Cane - single point;Grab bars - tub/shower      Prior Function Prior Level of Function : Independent/Modified Independent;Driving             Mobility Comments: Could ambulate in community without AD; denies falls; ADLs Comments: independent with adls and iadls     Extremity/Trunk Assessment   Upper Extremity Assessment Upper Extremity Assessment: Overall  WFL for tasks assessed    Lower Extremity Assessment Lower Extremity Assessment: Overall WFL for tasks assessed    Cervical / Trunk Assessment Cervical / Trunk Assessment: Normal  Communication      Cognition Arousal: Alert Behavior During Therapy: WFL for tasks assessed/performed Overall Cognitive Status: Within Functional Limits for tasks  assessed                                          General Comments General comments (skin integrity, edema, etc.): VSS on RA; sats 99% and HR 80's    Exercises     Assessment/Plan    PT Assessment Patient needs continued PT services  PT Problem List Decreased strength;Decreased range of motion;Cardiopulmonary status limiting activity;Decreased activity tolerance;Decreased knowledge of use of DME;Decreased balance;Decreased mobility       PT Treatment Interventions DME instruction;Therapeutic exercise;Gait training;Stair training;Functional mobility training;Therapeutic activities;Patient/family education;Neuromuscular re-education;Modalities;Balance training    PT Goals (Current goals can be found in the Care Plan section)  Acute Rehab PT Goals Patient Stated Goal: return home PT Goal Formulation: With patient Time For Goal Achievement: 06/07/23 Potential to Achieve Goals: Good    Frequency Min 1X/week     Co-evaluation               AM-PAC PT "6 Clicks" Mobility  Outcome Measure Help needed turning from your back to your side while in a flat bed without using bedrails?: None Help needed moving from lying on your back to sitting on the side of a flat bed without using bedrails?: None Help needed moving to and from a bed to a chair (including a wheelchair)?: A Little Help needed standing up from a chair using your arms (e.g., wheelchair or bedside chair)?: A Little Help needed to walk in hospital room?: A Little Help needed climbing 3-5 steps with a railing? : A Little 6 Click Score: 20    End of Session Equipment Utilized During Treatment: Gait belt Activity Tolerance: Patient tolerated treatment well Patient left: in bed;with call bell/phone within reach (no recliner - declined PT finding one, states just assumes sit EOB) Nurse Communication: Mobility status PT Visit Diagnosis: Other abnormalities of gait and mobility (R26.89);Muscle weakness  (generalized) (M62.81)    Time: 6295-2841 PT Time Calculation (min) (ACUTE ONLY): 18 min   Charges:   PT Evaluation $PT Eval Low Complexity: 1 Low   PT General Charges $$ ACUTE PT VISIT: 1 Visit         Anise Salvo, PT Acute Rehab Services [facility] [phone]   Rayetta Humphrey 05/24/2023, 2:00 PM

## 2023-05-24 NOTE — TOC Initial Note (Signed)
Transition of Care [facility]) - Initial/Assessment Note    Patient Details  Name: Melissa Snyder MRN: [id number] Date of Birth: 27-Sep-1933  Transition of [facility]) CM/SW Contact:    Adrian Prows, RN Phone Number: 05/24/2023, 10:44 AM  Clinical Narrative:                 TOC for d/c planning; spoke w/ pt in room; pt says she lives at home w/ her husband Ranyiah Laird [phone]); she plans to return at d/c; pt verified she has insurance and PCP; she denies SDOH risks; pt say her husband will transport home; she denies SDOH risks; pt says she has a walker, and cane; she does not have HH services or home oxygen; pt says she had HHPT when she had hip surgery; she does not remember the name of the agency; awaiting PT eval; TOC is following,  Expected Discharge Plan: Home/Self Care Barriers to Discharge: Continued Medical Work up   Patient Goals and CMS Choice Patient states their goals for this hospitalization and ongoing recovery are:: home CMS Medicare.gov Compare Post Acute Care list provided to:: Patient   [place] ownership interest in [facility].provided to:: Patient    Expected Discharge Plan and Services   Discharge Planning Services: CM Consult   Living arrangements for the past 2 months: Single Family Home                                      Prior Living Arrangements/Services Living arrangements for the past 2 months: Single Family Home Lives with:: Spouse Patient language and need for interpreter reviewed:: Yes Do you feel safe going back to the place where you live?: Yes      Need for Family Participation in Patient Care: Yes (Comment) Care giver support system in place?: Yes (comment) Current home services: DME (cane, walker) Criminal Activity/Legal Involvement Pertinent to Current Situation/Hospitalization: No - Comment as needed  Activities of Daily Living   ADL Screening (condition at time of admission) Independently performs  ADLs?: No Does the patient have a NEW difficulty with bathing/dressing/toileting/self-feeding that is expected to last >3 days?: No Does the patient have a NEW difficulty with getting in/out of bed, walking, or climbing stairs that is expected to last >3 days?: No Does the patient have a NEW difficulty with communication that is expected to last >3 days?: No Is the patient deaf or have difficulty hearing?: Yes (hearing aids at home) Does the patient have difficulty seeing, even when wearing glasses/contacts?: No Does the patient have difficulty concentrating, remembering, or making decisions?: No  Permission Sought/Granted Permission sought to share information with : Case Manager Permission granted to share information with : Yes, Verbal Permission Granted  Share Information with NAME: Case Manager     Permission granted to share info w Relationship: Alycea Rudzik (spouse) [phone]     Emotional Assessment Appearance:: Appears stated age Attitude/Demeanor/Rapport: Gracious Affect (typically observed): Accepting Orientation: : Oriented to Self, Oriented to Place, Oriented to  Time, Oriented to Situation Alcohol / Substance Use: Not Applicable Psych Involvement: No (comment)  Admission diagnosis:  CAP (community acquired pneumonia) [J18.9] Community acquired pneumonia, unspecified laterality [J18.9] Patient Active Problem List   Diagnosis Date Noted   CAP (community acquired pneumonia) 05/22/2023   Weight loss 01/07/2023   Other fatigue 01/07/2023   Hypothyroidism due to acquired atrophy of thyroid 07/09/2017   Constipation due  to opioid therapy 07/09/2017   Osteoporosis 07/09/2017   Anemia associated with acute blood loss 07/09/2017   Closed fracture of acetabulum with dislocation of left hip (HCC) 07/03/2017   S/p left hip fracture 07/03/2017   Primary cancer of left lower lobe of lung (HCC) 11/25/2014   Genetic testing 07/01/2014   Family history of breast cancer     Family history of bladder cancer    Family history of prostate cancer    Breast cancer of upper-outer quadrant of right female breast (HCC) 05/04/2014   Arthritis    Hyperlipidemia 01/12/2014   Meningioma (HCC) 01/04/2014   Melanoma in situ of face (HCC) 11/02/2013   Central retinal artery occlusion of left eye 10/24/2013   GERD (gastroesophageal reflux disease) 10/24/2013   PCP:  Rodrigo Ran, MD Pharmacy:   [facility] [address], [place] - [address] [place] [address] Phone: [phone] Fax: [phone]  [facility] [address] [place] [address] Phone: [phone] Fax: [phone]     Social Drivers of Health (SDOH) Social History: SDOH Screenings   Food Insecurity: No Food Insecurity (05/24/2023)  Housing: Low Risk  (05/24/2023)  Transportation Needs: No Transportation Needs (05/24/2023)  Utilities: Not At Risk (05/24/2023)  Tobacco Use: Low Risk  (05/22/2023)   SDOH Interventions: Food Insecurity Interventions: Intervention Not Indicated, Inpatient TOC Housing Interventions: Intervention Not Indicated, Inpatient TOC Transportation Interventions: Intervention Not Indicated, Inpatient TOC Utilities Interventions: Intervention Not Indicated, Inpatient TOC   Readmission Risk Interventions     No data to display

## 2023-05-25 DIAGNOSIS — J189 Pneumonia, unspecified organism: Secondary | ICD-10-CM

## 2023-05-25 DIAGNOSIS — E43 Unspecified severe protein-calorie malnutrition: Secondary | ICD-10-CM | POA: Insufficient documentation

## 2023-05-25 DIAGNOSIS — R531 Weakness: Secondary | ICD-10-CM | POA: Diagnosis not present

## 2023-05-25 DIAGNOSIS — R627 Adult failure to thrive: Secondary | ICD-10-CM | POA: Diagnosis not present

## 2023-05-25 NOTE — Discharge Summary (Signed)
Physician Discharge Summary   Patient: Melissa Snyder MRN: [id number] DOB: [birth year]  Admit date:     05/22/2023  Discharge date: 05/25/23  Discharge Physician: Meredeth Ide   PCP: Rodrigo Ran, MD   Recommendations at discharge:   Follow-up PCP in 2 weeks  Discharge Diagnoses: Principal Problem:   CAP (community acquired pneumonia) Active Problems:   Generalized weakness   Protein-calorie malnutrition, severe  Resolved Problems:   * No resolved hospital problems. [facility] Course:  89 y.o. female with hx of metastatic lung adenocarcinoma, malignant effusion, history of prior resection, radiation, currently on EGFR inhibitor, recent thoracentesis 12/1, hypertension, hypothyroidism, who presents due to progressive weakness.  Reports that over the past 2-1/2 weeks since her thoracentesis she has had progressive and generalized weakness that has not abated     Assessment and Plan:  Generalized weakness / deconditioning  History of dysphagia, decreased intake -Generalized weakness has significantly improved in the hospital. History of 2 and half weeks of progressive weakness.  Suspect mainly this is related to EGFR inhibitor versus her underlying malignancy, decreased nutritional intake /dehydration.  -Antibiotics were stopped as this was not community-acquired pneumonia. -B12 [id number] -SLP evaluation obtained for dysphagia, started on regular diet -Continue oral hydration as able; dietitian consulted, recommend to take high-protein supplements.  Patient takes boost high-protein at home. -PT/OT evaluation obtained, plan to go home with home health PT    Mild hyponatremia Suspect hypovolemic with low solute. Encourage PO intake.  -Sodium is 134 today   Goals of care: -Current wishes are to be DNR/DNI, confirmed with the patient.     Metastatic adeno carcinoma of lung, malignant effusion: Effusion appears stable since last thoracentesis.   -EGFR inhibitor is currently on  hold, will resume at discharge   Hypertension: Continue home atenolol, lisinopril 10 mg daily    Hypothyroidism: Continue home levothyroxine, checked TSH 4.282   ? ASCVD: Continue aspirin 81 mg daily          Consultants: None Procedures performed:   Disposition: Home Diet recommendation:  Discharge Diet Orders (From admission, onward)     Start     Ordered   05/25/23 0000  Diet - low sodium heart healthy        05/25/23 1039           Regular diet DISCHARGE MEDICATION: Allergies as of 05/25/2023       Reactions   Beef-derived Drug Products Other (See Comments)   ACID REFLUX   Other    FRIED FOODS UNSPECIFIED REACTION    Chocolate Other (See Comments)   Acid Reflux   Ibuprofen Rash, Other (See Comments)   Causes mouth sores   Niacin And Related Other (See Comments)   Flushing, burning, redness > REACTIONS NOT ALLERGIC    Tramadol Itching        Medication List     STOP taking these medications    calcium carbonate 1500 (600 Ca) MG Tabs tablet Commonly known as: OSCAL   magic mouthwash (nystatin, lidocaine, diphenhydrAMINE, alum & mag hydroxide) suspension       TAKE these medications    aspirin EC 81 MG tablet Take 81 mg by mouth at bedtime.   atenolol 50 MG tablet Commonly known as: TENORMIN Take 50 mg by mouth at bedtime.   b complex vitamins capsule Take 1 capsule by mouth daily.   BinaxNOW COVID-19 Ag Home Test Kit Generic drug: COVID-19 At Home Antigen Test See admin instructions.   calcium-vitamin  D 500-200 MG-UNIT tablet Commonly known as: OSCAL WITH D Take 1 tablet by mouth at bedtime.   co-enzyme Q-10 30 MG capsule Take 30 mg by mouth daily.   ENZYME DIGEST PO Take 5 mLs by mouth 3 (three) times daily with meals.   Fluzone High-Dose Quadrivalent 0.7 ML Susy Generic drug: Influenza Vac High-Dose Quad   levothyroxine 112 MCG tablet Commonly known as: SYNTHROID Take 112 mcg by mouth daily before breakfast.    lisinopril 10 MG tablet Commonly known as: ZESTRIL Take 1 tablet (10 mg total) by mouth daily. What changed: when to take this   meclizine 12.5 MG tablet Commonly known as: ANTIVERT Take 12.5 mg by mouth 3 (three) times daily as needed for dizziness.   multivitamin tablet Take 1 tablet by mouth every morning.   NASACORT AQ NA Place 1 spray into the nose at bedtime as needed (CONGESTION).   nystatin 100000 UNIT/ML suspension Commonly known as: MYCOSTATIN Take 5 mLs by mouth 4 (four) times daily.   Omega-3 1000 MG Caps Take 1 g by mouth daily.   Prolia 60 MG/ML Sosy injection Generic drug: denosumab Inject 60 mg into the skin every 6 (six) months.   Tagrisso 40 MG tablet Generic drug: osimertinib mesylate Take 1 tablet (40 mg total) by mouth daily.   Vitamin D3 25 MCG (1000 UT) Caps Take 1,000 Units by mouth at bedtime.   vitamin E 180 MG (400 UNITS) capsule Take 400 Units by mouth every Monday, Wednesday, and Friday. Paulo Fruit Fri        Discharge Exam: Ceasar Mons Weights   05/22/23 1832 05/24/23 0500  Weight: 50 kg 48 kg   General-appears in no acute distress Heart-S1-S2, regular, no murmur auscultated Lungs-clear to auscultation bilaterally, no wheezing or crackles auscultated Abdomen-soft, nontender, no organomegaly Extremities-no edema in the lower extremities Neuro-alert, oriented x3, no focal deficit noted  Condition at discharge: good  The results of significant diagnostics from this hospitalization (including imaging, microbiology, ancillary and laboratory) are listed below for reference.   Imaging Studies: CT CHEST WO CONTRAST Result Date: 05/23/2023 CLINICAL DATA:  History of lung cancer concern for worsening malignancy or effusion, weakness EXAM: CT CHEST WITHOUT CONTRAST TECHNIQUE: Multidetector CT imaging of the chest was performed following the standard protocol without IV contrast. RADIATION DOSE REDUCTION: This exam was performed according to the  departmental dose-optimization program which includes automated exposure control, adjustment of the mA and/or kV according to patient size and/or use of iterative reconstruction technique. COMPARISON:  Chest x-ray 05/22/2023, PET CT 05/10/2023, chest CT 05/05/2023 FINDINGS: Cardiovascular: Limited assessment without intravenous contrast. Advanced aortic atherosclerosis. No aneurysm. Coronary vascular calcification. Normal cardiac size. No pericardial effusion mitral calcification. Mediastinum/Nodes: Patent trachea. No suspicious thyroid mass. No increased adenopathy. Esophagus within normal limits. Lungs/Pleura: Biapical pleural and parenchymal nodular scarring. Postsurgical changes of the left lung with stable bandlike density along the staple line. Small moderate left pleural effusion, decreased compared to chest CT from 12/01, probably stable compared with PET CT on 12/06. Subpleural scarring and fibrosis in the anterior right upper lobe also without change. Scattered small ground-glass nodules, for example series 4, image 40, grossly unchanged compared to recent priors. Upper Abdomen: No acute finding. Musculoskeletal: Degenerative changes. No acute osseous abnormality. IMPRESSION: 1. Postsurgical changes of the left lung with stable bandlike density along the staple line. Small to moderate left pleural effusion, decreased compared to chest CT from 12/01, probably stable compared with PET CT on 12/06. No definite acute pulmonary  airspace disease is seen. 2. Scattered small ground-glass nodules, grossly unchanged compared to recent priors. Stable apical and anterior right upper lobe pulmonary scarring. 3. Aortic atherosclerosis. Aortic Atherosclerosis (ICD10-I70.0). Electronically Signed   By: Jasmine Pang M.D.   On: 05/23/2023 00:25   DG Chest 2 View Result Date: 05/22/2023 CLINICAL DATA:  Weakness, recent thoracentesis, history of cancer EXAM: CHEST - 2 VIEW COMPARISON:  PET-CT dated 05/10/2023 FINDINGS:  Mild patchy opacities in the left upper and lower lobes, suspicious for pneumonia. Scarring/postsurgical changes in the left mid lung. Associated small left pleural effusion. Heart is normal in size.  No pericardial effusion. Mild degenerative changes of the visualized thoracolumbar spine. IMPRESSION: Mild patchy opacities in the left upper and lower lobes, suspicious for pneumonia. Associated small left pleural effusion. Electronically Signed   By: Charline Bills M.D.   On: 05/22/2023 22:49   NM PET Image Restag (PS) Skull Base To Thigh Result Date: 05/10/2023 CLINICAL DATA:  Subsequent treatment strategy for breast cancer and lung cancer. EXAM: NUCLEAR MEDICINE PET SKULL BASE TO THIGH TECHNIQUE: 5.3 mCi F-18 FDG was injected intravenously. Full-ring PET imaging was performed from the skull base to thigh after the radiotracer. CT data was obtained and used for attenuation correction and anatomic localization. Fasting blood glucose: 105 mg/dl COMPARISON:  16/03/9603 FINDINGS: Mediastinal blood pool activity: SUV max 2.0 Liver activity: SUV max NA NECK: No significant abnormal hypermetabolic activity in this region. Incidental CT findings: Chronic right maxillary sinusitis. Bilateral common carotid atheromatous vascular calcification. CHEST: Small but mildly hypermetabolic mediastinal lymph nodes include a small subcarinal lymph node with maximum SUV 6.3, formerly 5.7. Overall appearance similar to prior. There is only faint activity along the scarring associated with the left lower lobe staple line, maximum SUV 2.0 and formerly 1.8. Bandlike nodularity anteriorly in the right middle lobe maximum SUV 1.9, formerly 2.1. Incidental CT findings: Moderate left pleural effusion. Post radiation therapy findings anteriorly in the right upper lobe. Biapical pleuroparenchymal scarring. Coronary, aortic arch, and branch vessel atherosclerotic vascular disease. Dense mitral valve calcification. ABDOMEN/PELVIS: New small  focus of hypermetabolic activity along a diverticulum associated with the distal sigmoid colon, maximum SUV 9.1, likely inflammatory given the absence of this finding on 07/13/2022. No corresponding abnormality other than for the diverticulum. Incidental CT findings: Atherosclerosis is present, including aortoiliac atherosclerotic disease. Sigmoid colon diverticulosis. SKELETON: No significant abnormal hypermetabolic activity in this region. Incidental CT findings: Left hip prosthesis.  Thoracic kyphosis. IMPRESSION: 1. Small but mildly hypermetabolic mediastinal lymph nodes are similar to prior. These are presumably reactive. 2. No findings of active malignancy. 3. Bandlike nodularity anteriorly in the right middle lobe maximum SUV 1.9, formerly 2.1. Similar stable very low-grade activity along the left staple line. 4. Moderate left pleural effusion. 5. New small focus of hypermetabolic activity along a diverticulum associated with the distal sigmoid colon, maximum SUV 9.1, likely inflammatory given the absence of this finding on 07/13/2022. 6. Chronic right maxillary sinusitis. 7. Coronary, aortic arch, and branch vessel atherosclerotic vascular disease. Dense mitral valve calcification Aortic Atherosclerosis (ICD10-I70.0). Electronically Signed   By: Gaylyn Rong M.D.   On: 05/10/2023 16:16   US THORACENTESIS ASP PLEURAL SPACE W/IMG GUIDE Result Date: 05/05/2023 INDICATION: Lung Cancer Recurrent Left pleural effusion EXAM: ULTRASOUND GUIDED LEFT THORACENTESIS MEDICATIONS: 10 cc 1% lidocaine. COMPLICATIONS: None immediate. PROCEDURE: An ultrasound guided thoracentesis was thoroughly discussed with the patient and questions answered. The benefits, risks, alternatives and complications were also discussed. The patient understands and wishes to  proceed with the procedure. Written consent was obtained. Ultrasound was performed to localize and mark an adequate pocket of fluid in the left chest. The area was  then prepped and draped in the normal sterile fashion. 1% Lidocaine was used for local anesthesia. Under ultrasound guidance a 7 cm Yueh catheter was introduced. Thoracentesis was performed. The catheter was removed and a dressing applied. FINDINGS: A total of approximately 850 cc of serous fluid was removed. Samples were sent to the laboratory as requested by the clinical team Successful ultrasound guided left thoracentesis yielding 850 cc of pleural fluid. Performed by Robet Leu [facility] Electronically Signed   By: Simonne Come M.D.   On: 05/05/2023 11:11   DG Chest 1 View Result Date: 05/05/2023 CLINICAL DATA:  Thoracentesis EXAM: CHEST  1 VIEW COMPARISON:  Same-day x-ray FINDINGS: Near-complete resolution of previously seen left pleural effusion following thoracentesis. No pneumothorax. Normal heart size. No new airspace opacity. Stable areas of scarring bilaterally. Hyperinflated lungs. IMPRESSION: Near-complete resolution of previously seen left pleural effusion following thoracentesis. No pneumothorax. Electronically Signed   By: Duanne Guess D.O.   On: 05/05/2023 11:09   CT Angio Chest PE W/Cm &/Or Wo Cm Result Date: 05/05/2023 CLINICAL DATA:  Pulmonary embolism (PE) suspected, low to intermediate prob, positive D-dimer. History of lung cancer EXAM: CT ANGIOGRAPHY CHEST WITH CONTRAST TECHNIQUE: Multidetector CT imaging of the chest was performed using the standard protocol during bolus administration of intravenous contrast. Multiplanar CT image reconstructions and MIPs were obtained to evaluate the vascular anatomy. RADIATION DOSE REDUCTION: This exam was performed according to the departmental dose-optimization program which includes automated exposure control, adjustment of the mA and/or kV according to patient size and/or use of iterative reconstruction technique. CONTRAST:  60mL OMNIPAQUE IOHEXOL 350 MG/ML SOLN COMPARISON:  03/11/2023 FINDINGS: Cardiovascular: Heart size within normal limits.  No pericardial effusion thoracic aorta is nonaneurysmal. Atherosclerotic calcifications of the aorta and coronary arteries. Central pulmonary vasculature is adequately opacified. No pulmonary arterial filling defects identified. Mediastinum/Nodes: No enlarged mediastinal, hilar, or axillary lymph nodes. Thyroid gland, trachea, and esophagus demonstrate no significant findings. Lungs/Pleura: Moderate-sized left pleural effusion, increased. Dependent atelectasis in the left lung base. Chronic scarring along the suture within the left lung. Subpleural scarring along the anterior aspect of the right upper lobe is also unchanged. Right apical pleuroparenchymal scarring. Small scattered ground-glass nodules are unchanged. No new or enlarging pulmonary nodules. No right-sided pleural effusion. No pneumothorax. Upper Abdomen: No acute abnormality. Musculoskeletal: Exaggerated thoracic kyphosis multilevel degenerative spondylosis. No new or acute bony abnormality. No chest wall abnormality. Review of the MIP images confirms the above findings. IMPRESSION: 1. No evidence of pulmonary embolism. 2. Moderate-sized left pleural effusion, increased. 3. Stable postsurgical changes within the left lung. 4. Aortic and coronary artery atherosclerosis (ICD10-I70.0). Electronically Signed   By: Duanne Guess D.O.   On: 05/05/2023 10:28   DG Chest 2 View Result Date: 05/05/2023 CLINICAL DATA:  Shortness of breath. EXAM: CHEST - 2 VIEW COMPARISON:  CT without contrast 03/11/2023 FINDINGS: The lungs demonstrate emphysematous, scarring and postsurgical changes. Small left pleural effusion is again noted and seems unchanged. There is overlying atelectasis or airspace disease. There are no other significant opacities. The right sulci are sharp. Stable cardiomediastinal silhouette with aortic tortuosity and calcification, normal heart size. No new osseous findings. Osteopenia and degenerative change of the spine. Thoracic kyphosis.  IMPRESSION: 1. Small left pleural effusion with overlying atelectasis or airspace disease. No other significant change. 2. Emphysema, scarring  and postsurgical changes. 3. Aortic atherosclerosis. Electronically Signed   By: Almira Bar M.D.   On: 05/05/2023 07:37    Microbiology: Results for orders placed or performed during the hospital encounter of 05/22/23  Respiratory (~20 pathogens) panel by PCR     Status: None   Collection Time: 05/22/23 12:03 AM   Specimen: Nasopharyngeal Swab; Respiratory  Result Value Ref Range Status   Adenovirus NOT DETECTED NOT DETECTED Corrected   Coronavirus 229E NOT DETECTED NOT DETECTED Corrected    Comment: (NOTE) The Coronavirus on the Respiratory Panel, DOES NOT test for the novel  Coronavirus (2019 nCoV) CORRECTED ON 12/19 AT 0428: PREVIOUSLY REPORTED AS NOT DETECTED    Coronavirus HKU1 NOT DETECTED NOT DETECTED Corrected   Coronavirus NL63 NOT DETECTED NOT DETECTED Corrected   Coronavirus OC43 NOT DETECTED NOT DETECTED Corrected   Metapneumovirus NOT DETECTED NOT DETECTED Corrected   Rhinovirus / Enterovirus NOT DETECTED NOT DETECTED Corrected   Influenza A NOT DETECTED NOT DETECTED Corrected   Influenza B NOT DETECTED NOT DETECTED Corrected   Parainfluenza Virus 1 NOT DETECTED NOT DETECTED Corrected   Parainfluenza Virus 2 NOT DETECTED NOT DETECTED Corrected   Parainfluenza Virus 3 NOT DETECTED NOT DETECTED Corrected   Parainfluenza Virus 4 NOT DETECTED NOT DETECTED Corrected   Respiratory Syncytial Virus NOT DETECTED NOT DETECTED Corrected   Bordetella pertussis NOT DETECTED NOT DETECTED Corrected   Bordetella Parapertussis NOT DETECTED NOT DETECTED Corrected   Chlamydophila pneumoniae NOT DETECTED NOT DETECTED Corrected   Mycoplasma pneumoniae NOT DETECTED NOT DETECTED Corrected    Comment: Performed at [facility], [address] [place], [place] [address]    Labs: CBC: Recent Labs  Lab 05/22/23 2022 05/23/23 0445  05/24/23 0426  WBC 3.6* 3.5* 3.2*  NEUTROABS 2.5  --   --   HGB 11.3* 11.2* 11.2*  HCT 34.6* 33.1* 35.1*  MCV 89.4 88.5 90.7  PLT 110* 116* 121*   Basic Metabolic Panel: Recent Labs  Lab 05/22/23 2022 05/23/23 0445 05/24/23 0426  NA 131* 137 134*  K 4.0 3.9 4.0  CL 98 101 102  CO2 25 27 24   GLUCOSE 96 94 94  BUN 25* 21 19  CREATININE 0.63 0.64 0.70  CALCIUM 8.5* 8.8* 8.6*  MG  --  2.2  --   PHOS  --  3.1  --    Liver Function Tests: Recent Labs  Lab 05/22/23 2022 05/24/23 0426  AST 30 29  ALT 28 24  ALKPHOS 67 66  BILITOT 0.7 0.7  PROT 6.6 6.0*  ALBUMIN 3.6 3.2*   CBG: No results for input(s): "GLUCAP" in the last 168 hours.  Discharge time spent: greater than 30 minutes.  Signed: Meredeth Ide, MD [facility] 05/25/2023

## 2023-05-30 ENCOUNTER — Other Ambulatory Visit: Payer: Self-pay

## 2023-05-30 ENCOUNTER — Other Ambulatory Visit: Payer: Self-pay | Admitting: Hematology

## 2023-05-30 MED ORDER — OSIMERTINIB MESYLATE 40 MG PO TABS
40.0000 mg | ORAL_TABLET | Freq: Every day | ORAL | 1 refills | Status: DC
  Filled 2023-05-30: qty 30, 30d supply, fill #0
  Filled 2023-06-28: qty 30, 30d supply, fill #1

## 2023-05-30 NOTE — Progress Notes (Signed)
Specialty Pharmacy Ongoing Clinical Assessment Note  Melissa Snyder is a 89 y.o. female who is being followed by the specialty pharmacy service for RxSp Oncology   Patient's specialty medication(s) reviewed today: Osimertinib Mesylate (TAGRISSO)   Missed doses in the last 4 weeks: 0   Patient/Caregiver did not have any additional questions or concerns.   Therapeutic benefit summary: Patient is achieving benefit (Per OV 03/20/23, provider notes good response)   Adverse events/side effects summary: No adverse events/side effects   Patient's therapy is appropriate to: Continue    Goals Addressed             This Visit's Progress    Stabilization of disease       Patient is on track. Patient will maintain adherence         Follow up:  6 months  Bobette Mo Specialty Pharmacist

## 2023-05-30 NOTE — Progress Notes (Signed)
Specialty Pharmacy Refill Coordination Note  Melissa Snyder is a 89 y.o. female contacted today regarding refills of specialty medication(s) Osimertinib Mesylate [name]   Patient requested Delivery   Delivery date: 06/11/23   Verified address: [address]   Medication will be filled on 06/10/23. Pending Refill Request.

## 2023-06-11 ENCOUNTER — Telehealth: Payer: Self-pay | Admitting: Pharmacy Technician

## 2023-06-11 ENCOUNTER — Other Ambulatory Visit (HOSPITAL_COMMUNITY): Payer: Self-pay

## 2023-06-11 ENCOUNTER — Other Ambulatory Visit: Payer: Self-pay

## 2023-06-11 NOTE — Telephone Encounter (Signed)
 Oral Oncology Patient Advocate Encounter   Was successful in securing patient a $4,000 grant from [employer] to provide copayment coverage for Tagrisso .  This will keep the out of pocket expense at $0.      The billing information is as follows and has been shared with [facility].   Member ID: [id number] Group ID: [id number] RxBin: [id number] PCN: [id number] Dates of Eligibility: 06/22/23 through 06/20/24  Fund name:  [employer].   Estefana Moellers, CPhT-Adv Oncology Pharmacy Patient Advocate [facility] Direct Number: [phone]  Fax: [phone]

## 2023-06-12 ENCOUNTER — Other Ambulatory Visit: Payer: Self-pay

## 2023-06-20 ENCOUNTER — Other Ambulatory Visit: Payer: Self-pay

## 2023-06-20 ENCOUNTER — Encounter: Payer: Self-pay | Admitting: Hematology

## 2023-06-20 ENCOUNTER — Inpatient Hospital Stay: Payer: Medicare HMO | Attending: Hematology

## 2023-06-20 ENCOUNTER — Inpatient Hospital Stay (HOSPITAL_BASED_OUTPATIENT_CLINIC_OR_DEPARTMENT_OTHER): Payer: Medicare HMO | Admitting: Hematology

## 2023-06-20 VITALS — BP 161/74 | HR 73 | Temp 98.4°F | Resp 16 | Ht 65.0 in | Wt 107.0 lb

## 2023-06-20 DIAGNOSIS — R5383 Other fatigue: Secondary | ICD-10-CM

## 2023-06-20 DIAGNOSIS — R634 Abnormal weight loss: Secondary | ICD-10-CM

## 2023-06-20 DIAGNOSIS — Z85118 Personal history of other malignant neoplasm of bronchus and lung: Secondary | ICD-10-CM | POA: Insufficient documentation

## 2023-06-20 DIAGNOSIS — C3432 Malignant neoplasm of lower lobe, left bronchus or lung: Secondary | ICD-10-CM | POA: Diagnosis not present

## 2023-06-20 DIAGNOSIS — Z853 Personal history of malignant neoplasm of breast: Secondary | ICD-10-CM | POA: Insufficient documentation

## 2023-06-20 LAB — CBC WITH DIFFERENTIAL/PLATELET
Abs Immature Granulocytes: 0 10*3/uL (ref 0.00–0.07)
Basophils Absolute: 0 10*3/uL (ref 0.0–0.1)
Basophils Relative: 1 %
Eosinophils Absolute: 0.1 10*3/uL (ref 0.0–0.5)
Eosinophils Relative: 2 %
HCT: 37.8 % (ref 36.0–46.0)
Hemoglobin: 12.4 g/dL (ref 12.0–15.0)
Immature Granulocytes: 0 %
Lymphocytes Relative: 14 %
Lymphs Abs: 0.5 10*3/uL — ABNORMAL LOW (ref 0.7–4.0)
MCH: 29 pg (ref 26.0–34.0)
MCHC: 32.8 g/dL (ref 30.0–36.0)
MCV: 88.3 fL (ref 80.0–100.0)
Monocytes Absolute: 0.4 10*3/uL (ref 0.1–1.0)
Monocytes Relative: 10 %
Neutro Abs: 2.6 10*3/uL (ref 1.7–7.7)
Neutrophils Relative %: 73 %
Platelets: 92 10*3/uL — ABNORMAL LOW (ref 150–400)
RBC: 4.28 MIL/uL (ref 3.87–5.11)
RDW: 14.5 % (ref 11.5–15.5)
WBC: 3.5 10*3/uL — ABNORMAL LOW (ref 4.0–10.5)
nRBC: 0 % (ref 0.0–0.2)

## 2023-06-20 LAB — COMPREHENSIVE METABOLIC PANEL
ALT: 24 U/L (ref 0–44)
AST: 24 U/L (ref 15–41)
Albumin: 4.1 g/dL (ref 3.5–5.0)
Alkaline Phosphatase: 77 U/L (ref 38–126)
Anion gap: 5 (ref 5–15)
BUN: 23 mg/dL (ref 8–23)
CO2: 33 mmol/L — ABNORMAL HIGH (ref 22–32)
Calcium: 9.8 mg/dL (ref 8.9–10.3)
Chloride: 102 mmol/L (ref 98–111)
Creatinine, Ser: 0.72 mg/dL (ref 0.44–1.00)
GFR, Estimated: >60 mL/min (ref >60)
Glucose, Bld: 130 mg/dL — ABNORMAL HIGH (ref 70–99)
Potassium: 4.1 mmol/L (ref 3.5–5.1)
Sodium: 140 mmol/L (ref 135–145)
Total Bilirubin: 0.6 mg/dL (ref 0.0–1.2)
Total Protein: 7 g/dL (ref 6.5–8.1)

## 2023-06-20 NOTE — Progress Notes (Signed)
[facility]   [phone] Fax:(336) [phone]   Clinic Follow up Note   Patient Care Team: Rodrigo Ran, MD as PCP - General (Internal Medicine) Loreli Slot, MD as Consulting Physician (Cardiothoracic Surgery) Alfredo Martinez, MD as Consulting Physician (Urology) Malachy Mood, MD as Consulting Physician (Hematology) Lurline Hare, MD as Consulting Physician (Radiation Oncology) Ronny Bacon, PA-C as Physician Assistant (Radiation Oncology) Imaging, [facility] as Consulting Physician (Diagnostic Radiology)  Date of Service:  06/20/2023  CHIEF COMPLAINT: f/u of metastatic non-small cell lung cancer  CURRENT THERAPY:  Tagrisso 40 mg daily  Oncology History   Primary cancer of left lower lobe of lung (HCC) Stage I left lung adenocarcinoma, pT1aN0M0 in 2016, LLL recurrence in 06/2017, LUL recurrence in 10/2020, malignant left pleural effusion in 04/2022 -initially diagnosed in 11/2014, s/p LLL segmentectomy by Dr. Dorris Fetch, pathology showing stage I adenocarcinoma -She had cancer recurrence in LLL in 06/2017 and in LUL in 10/2020. She was treated with radiation in 07/2017 and in 11/2020 with Dr. Mitzi Hansen.  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  The left pleura is not hypermetabolic on the PET scan. -She previously tolerated Tagrisso 40 mg daily very well, I increased the dose to 80 mg daily but did not tolerated well, and have  reduced dose back to 40 mg daily -PET 05/10/2023 showed no significant cancer progression, with minimal activity in lymph nodes and pleura. Her pleural effusion increased and required thoracentesis in early December 2024, with clinical concern for disease progression. -Due to her advanced age, she is not interested in chemotherapy    Assessment and Plan    Metastatic Non-Small Cell Lung Cancer 89 year old with metastatic non-small cell lung cancer on Tagrisso (osimertinib). Reports low energy levels in the afternoon. Weight stable at 107 lbs. Recent CT scan showed minor pleural effusion. Discussed potential future copay concerns for Tagrisso and financial assistance options. - Repeat CT scan in April -Patient knows to call if she has worsening dyspnea from pleural effusion, and schedule thoracentesis - Follow up in six weeks to check blood counts and order scan - Monitor for symptoms of increased pleural effusion and call if symptoms worsen  Oral Thrush Previous episode of oral thrush treated with prescribed mouthwash, resolved symptoms. Accidental use of alcohol-containing mouthwash caused black tongue discoloration. Resumed prescribed mouthwash, condition improved. - Advise to use non-alcohol containing mouthwash in the future  General Health Maintenance Maintaining weight and well-managed blood counts. Advised to monitor health and report significant changes. - Continue current medications - Monitor for any new symptoms or changes in health  Plan -She is clinically stable, will continue Tagrisso - Follow up in six weeks - Schedule CT scan in April.         SUMMARY OF ONCOLOGIC HISTORY: Oncology History Overview Note   Cancer Staging  Breast cancer of upper-outer quadrant of right female breast [facility]) Staging form: Breast, AJCC 7th Edition - Clinical stage from 05/19/2014: Stage IA (T1b, N0, M0) - Signed by Hubbard Hartshorn, NP on 11/17/2015 - Pathologic stage from 10/17/2014:  Stage  IA (T1b, N0, cM0) - Signed by Lurline Hare, MD on 10/17/2014  Primary cancer of left lower lobe of lung [facility]) Staging form: Lung, AJCC 7th Edition - Clinical stage from 12/14/2014: Stage IA (T1a, N0, M0) - Unsigned    Breast cancer of upper-outer quadrant of right female breast (HCC)  05/04/2014 Initial Diagnosis   Breast cancer   05/19/2014 Imaging   Diagnostic mammogram and ultrasound showed a 7 mm irregular suspicious mass located in the right breast at the 10:00 position, 7 cm from the nipple. Otherwise negative.   05/19/2014 Initial Biopsy   Right breast biopsy (UOQ): IDC, grade 2.    05/19/2014 Receptors her2   ER 100% positive, PR 35% positive, HER-2 negative, Ki67 8%   06/07/2014 Miscellaneous   Genetic testing normal.  APC, ATM, BARD1, BMPR1A, BRCA1, BRCA2, BRIP1, CDH1, CDK4, CDKN2A, CHEK2, EPCAM, GREM1, MLH1, MRE11A, MSH2, MSH6, MUTYH, NBN, NF1, PALB2, PMS2, POLD1, POLE, PTEN, RAD50, RAD51D, SMAD4, SMARCA4, STK11, and TP53 tested.    07/23/2014 Surgery   Right breast lumpectomy with SLNB Ezzard Standing) showed invasive ductal carcinoma & DCIS; negative surgical margins.   07/23/2014 Pathology Results   Invasive ductal carcinoma, tumor size 0.8 cm, grade 1, no lymphovascular invasion, margins were negative, 2 sentinel lymph nodes were negative. (+) DCIS.   07/23/2014 Pathologic Stage   pT1b, pN0: Stage IA    09/16/2014 - 10/07/2014 Radiation Therapy   XRT completed Michell Heinrich). Right breast/ 42.72 Gy at 2.67 Gy per fraction x 21 fractions.     Anti-estrogen oral therapy   Aromasin prescribed in 01/2015 but Patient declines anti-estrogen therapy at this time d/t high co-pay of medication and possible side effects of AI therapy.    05/02/2015 Mammogram   Diagnostic TOMO bilat mammogram: No evidence of malignancy in either breast. Lumpectomy changes in right breast. Diagnostic mammo suggested in 1 year.    Primary cancer of left lower lobe of lung (HCC)  10/25/2013 Imaging    CXR done for stroke protocol. No acute chest findings. Chronic lung disease with biapical scarring. Developing nodule in (L) apex cannot be excluded based on exam, athough may be d/t overlap of osseous structures.    10/27/2013 Imaging   CXR: Nodular opacity in mid (L) apex and appears more prominent than prior study. Area that appeared masslike in LUL near apex on recent CXR not seen at this time.  Underlying emphysema present. No edema or consolidation   11/12/2013 Imaging   CT chest: At left lung apex, there is asymmetric pleural parenchymal scarring. No discrete lesion.  In superior segment of LLL, there are 1 dominant ground-glass opacity measuring 16 mm. 2 addt'l ground-glass opacities appreciated. F/U with CT in 3 months   02/11/2014 Imaging   CT chest: Several small bilat faint nodular densities, largest measures 1.3 cm over superior segment LLL. No new nodules, no adenopathy. Cannot exclude metastatic disease given pt's h/o melanoma. Recommend PET-CT   02/23/2014 PET scan   Persistent semi-solid nodule in superior segment LLL with low metabolic activity. Moderately metabolic subcarinal & hilar LN are likely reactive.    05/25/2014 Imaging   CT chest: Persistent & similar size dominant LLL sub-solid nodule. Adenocarcinoma is considered. Biopsy or resection strongly considered. Additional scattered small ground-glass nodules stable.    06/02/2014 Imaging   MRI brain: Grossly stable appearance of left planum sphenoidale meningioma measuring 20 x 14 mm. Meningioma does not extend to left orbital apex & contacts left optic nerve. Stable right posterior clinoid meningioma .  09/27/2014 Imaging   CT chest: Further enlargment of central solid component in sub-solid LLL pulm nodule. Findings remain concerning for adenoca. Additional scattered small bilat ground-glass nodules and biapical scarring stable. No adenopathy.     11/25/2014 Surgery   Video bronchoscopy with endobronchial ultrasound;  Video assisted thoracoscopy; Thoracoscopic LLL superior segmentectomy; Mediastinal lymph node dissection; Cyro intercostal nerve block Dorris Fetch)   11/25/2014 Pathology Results   Superior segment lung resection (LLL). Adenocarcinoma, well-diff, spanning 1.5 cm. Surgical margins negative. 5 LNs negative.    11/25/2014 Pathologic Stage   pT1a, pN0: Stage IA    11/25/2014 Initial Diagnosis   Primary cancer of left lower lobe of lung (HCC)   05/31/2015 Imaging   CT chest: No evidence of local recurrent at superior segmentectomy in LLL. New small nodular focus of consolidation surrounding thin parenchymal band. Additional sub-cm ground-glass and solid nodules favored to be benign. No thoracic adenopathy   08/11/2015 Imaging   CT chest: No evidence of residual or recurrent tumor in LLL. Stable small nodular focus in LLL with thin surrounding parenchymal band, favored to be benign.  Multiple small ground-glass nodules unchanged.    06/19/2017 Pathology Results   Diagnosis 1. Lung, biopsy, Left lower lobe - ADENOCARCINOMA. - SEE COMMENT. 2. Lung, biopsy, Left lower lobe - BENIGN LUNG PARENCHYMA. - THERE IS NO EVIDENCE OF MALIGNANCY.   07/09/2017 - 07/22/2017 Radiation Therapy   Radiation treatment dates: 07/09/17-07/22/17 by Dr. Mitzi Hansen    Site/dose:  Left lung/ 50 Gy in 10 fractions   Beams/energy:  IMRT/ 6X   Narrative: The patient tolerated radiation treatment relatively well. She denied pain, fatigue, or difficulty swallowing. Skin to the treatment area appears warm, dry, and normal in color.   06/18/2019 Imaging   CT Chest  IMPRESSION: 1. There are 2 round solid nodules within the left lower lobe which have increased in size when compared with the previous exam, suspicious. Additionally, along the area of postsurgical change around the oblique fissure of the left lung there is progressive thickening with persistent loculated fluid. Cannot rule out local tumor recurrence along the  suture line. 2. No significant change in the appearance of bilateral upper lobe sub solid lung nodules. 3. Aortic Atherosclerosis (ICD10-I70.0). Coronary artery calcifications.     07/07/2019 PET scan   IMPRESSION: 1. No evidence of hypermetabolic local tumor recurrence in the left lung. Low level nonfocal uptake along the segmentectomy site in the left lower lobe favors post treatment change. Continued chest CT surveillance warranted. 2. Two small solid left lower lobe pulmonary nodules, largest 0.7 cm, below PET resolution. Given the growth of these nodules since 10/03/2018 chest CT, metastatic disease remains on the differential. Close chest CT surveillance recommended. 3. Nonspecific hypermetabolism within nonenlarged subcarinal and bilateral hilar lymph nodes, not substantially changed since 03/04/2017 PET-CT study, favoring reactive uptake. 4. No hypermetabolic distant metastatic disease. 5. Chronic findings include: Aortic Atherosclerosis (ICD10-I70.0). Subcentimeter ground-glass pulmonary nodules in the upper lobes are unchanged and are below PET resolution. Moderate sigmoid diverticulosis. Chronic right maxillary sinusitis.   04/13/2020 Imaging   CT Chest  IMPRESSION: 1. Primarily similar appearance of the lungs compared to 10/14/2019. Left sided surgical sutures with similar solid and subsolid pulmonary nodules as detailed above. A right middle lobe pleural based 5 mm nodule is new since the prior exam, warranting follow-up attention. 2. No thoracic adenopathy. 3. Aortic atherosclerosis (ICD10-I70.0), coronary artery atherosclerosis and emphysema (ICD10-J43.9).   10/10/2020 Imaging   CT chest  IMPRESSION: Postsurgical and post  radiation changes in the lungs bilaterally, as above.   12 x 10 mm left lower lobe nodule, mildly progressive. Additional bilobed nodule in the posterior left lower lobe, also favored to be progressive. PET-CT is suggested for further  evaluation.   Aortic Atherosclerosis (ICD10-I70.0) and Emphysema (ICD10-J43.9).   11/22/2020 - 12/02/2020 Radiation Therapy   Site Technique Total Dose (Gy) Dose per Fx (Gy) Completed Fx Beam Energies  Lung, Left: Lung_Lt_LLL IMRT 60/60 12 5/5 6XFFF     05/22/2021 Imaging   EXAM: CT CHEST WITHOUT CONTRAST  IMPRESSION: 1. Interval development of a new area of volume loss, ground-glass opacity, and bronchiectasis in the posterior left lower lobe. Imaging features are compatible with radiation therapy. 2. The posterior left lung nodules, including index 12 x 10 mm nodule described previously have become incorporated into the post treatment change on the current study. 3. Interval resolution of the posterior right middle lobe nodularity seen previously. 4. Other scattered areas of consolidative opacity or similar. 5. Trace left pleural effusion. 6. Aortic Atherosclerosis (ICD10-I70.0).      Discussed the use of AI scribe software for clinical note transcription with the patient, who gave verbal consent to proceed.  History of Present Illness   The patient, an 89 year old with metastatic non-small cell lung cancer, presents for a routine follow-up. She reports feeling "pretty good" overall, but notes that her energy levels are low, particularly in the afternoons. She sometimes rests in the afternoons, but does not usually sleep. She reports that her appetite is good and she is maintaining her weight. She has noticed some tightness in her chest, but it is not as severe as the shortness of breath she experienced prior to a recent procedure. She expresses concern about the possibility of her symptoms worsening and indicates that she would not hesitate to seek emergency care if necessary.  The patient also discusses a recent hospital stay, during which she neglected to brush her teeth for three days. Following this, she used an alcohol-containing mouthwash, which resulted in a blackened tongue.  She was prescribed a medication for thrush, which improved the condition of her tongue. She expresses regret for using the mouthwash, acknowledging that she was advised to avoid alcohol-containing products.         All other systems were reviewed with the patient and are negative.  MEDICAL HISTORY:  Past Medical History:  Diagnosis Date   Adenocarcinoma (HCC)    recurrent/notes 07/03/2017   Blind left eye 10/2013   cva   Breast cancer (HCC) 05/2014   ER+/PR+/Her2-     LUNG CANCER   Bundle branch block left    Chest pain    Family history of bladder cancer    Family history of breast cancer    Family history of prostate cancer    GERD (gastroesophageal reflux disease)    History of blood transfusion    age 89   Hyperlipidemia    Hypertension    Hypothyroid    Lung nodule    Melanoma in situ of face (HCC) JUNE 2015   LEFT CHEEK   Meningioma (HCC)    brain lining   Migraine    reports only having one   Osteoarthritis    Personal history of radiation therapy    Radiation 09/16/14-10/07/14   Right  Breast  21 fractions   Skin cancer    Stroke (HCC) 2015   Blind in left eye as a result    SURGICAL HISTORY: Past Surgical History:  Procedure  Laterality Date   ABDOMINAL HYSTERECTOMY  ~ 89   APPENDECTOMY     age 35   BILATERAL SALPINGOOPHORECTOMY     BLADDER REPAIR  2009   cystocele   BRAIN MENINGIOMA EXCISION     Gamma knife to Meningioma in brain lining   BREAST BIOPSY     BREAST LUMPECTOMY Right 07/23/2014   invasive ductal   BUNIONECTOMY     bilateral great toe   CARDIAC CATHETERIZATION  2004   CHOLECYSTECTOMY  ~ 1997   COLONOSCOPY     CRYO INTERCOSTAL NERVE BLOCK Left 11/25/2014   Procedure: CRYO INTERCOSTAL NERVE BLOCK;  Surgeon: Loreli Slot, MD;  Location: [facility] OR;  Service: Thoracic;  Laterality: Left;   CT RADIATION THERAPY GUIDE     Gamma radiation -lt frontal-[facility]   DILATION AND CURETTAGE OF UTERUS     X 2   HIP ARTHROPLASTY Left 07/04/2017    Procedure: ARTHROPLASTY BIPOLAR HIP (HEMIARTHROPLASTY);  Surgeon: Jones Broom, MD;  Location: [facility] OR;  Service: Orthopedics;  Laterality: Left;   IR THORACENTESIS ASP PLEURAL SPACE W/IMG GUIDE  02/27/2023   MELANOMA EXCISION Left 11/05/23, 11/30/13   CHEEK   SEGMENTECOMY Left 11/25/2014   Procedure: LEFT LOWER LOBE SEGMENTECTOMY;  Surgeon: Loreli Slot, MD;  Location: [facility] OR;  Service: Thoracic;  Laterality: Left;   VIDEO ASSISTED THORACOSCOPY Left 11/25/2014   Procedure: VIDEO ASSISTED THORACOSCOPY;  Surgeon: Loreli Slot, MD;  Location: [facility] OR;  Service: Thoracic;  Laterality: Left;   VIDEO BRONCHOSCOPY WITH ENDOBRONCHIAL NAVIGATION N/A 06/19/2017   Procedure: VIDEO BRONCHOSCOPY;  Surgeon: Loreli Slot, MD;  Location: [facility] OR;  Service: Thoracic;  Laterality: N/A;   VIDEO BRONCHOSCOPY WITH ENDOBRONCHIAL ULTRASOUND N/A 11/25/2014   Procedure: VIDEO BRONCHOSCOPY WITH ENDOBRONCHIAL ULTRASOUND;  Surgeon: Loreli Slot, MD;  Location: [facility] OR;  Service: Thoracic;  Laterality: N/A;    I have reviewed the social history and family history with the patient and they are unchanged from previous note.  ALLERGIES:  is allergic to beef-derived drug products, other, chocolate, ibuprofen, niacin and related, and tramadol.  MEDICATIONS:  Current Outpatient Medications  Medication Sig Dispense Refill   aspirin 81 MG EC tablet Take 81 mg by mouth at bedtime.     atenolol (TENORMIN) 50 MG tablet Take 50 mg by mouth at bedtime.      b complex vitamins capsule Take 1 capsule by mouth daily.     BINAXNOW COVID-19 AG HOME TEST KIT See admin instructions.     calcium-vitamin D (OSCAL WITH D) 500-200 MG-UNIT per tablet Take 1 tablet by mouth at bedtime.      Cholecalciferol (VITAMIN D3) 1000 units CAPS Take 1,000 Units by mouth at bedtime.     co-enzyme Q-10 30 MG capsule Take 30 mg by mouth daily.     denosumab (PROLIA) 60 MG/ML SOSY injection Inject 60 mg into the skin every 6 (six)  months.     Digestive Enzymes (ENZYME DIGEST PO) Take 5 mLs by mouth 3 (three) times daily with meals.     FLUZONE HIGH-DOSE QUADRIVALENT 0.7 ML SUSY      levothyroxine (SYNTHROID, LEVOTHROID) 112 MCG tablet Take 112 mcg by mouth daily before breakfast.      lisinopril (PRINIVIL,ZESTRIL) 10 MG tablet Take 1 tablet (10 mg total) by mouth daily. (Patient taking differently: Take 10 mg by mouth 2 (two) times daily.) 30 tablet 1   meclizine (ANTIVERT) 12.5 MG tablet Take 12.5 mg by mouth 3 (three) times  daily as needed for dizziness.     Multiple Vitamin (MULTIVITAMIN) tablet Take 1 tablet by mouth every morning.      nystatin (MYCOSTATIN) 100000 UNIT/ML suspension Take 5 mLs by mouth 4 (four) times daily.     Omega-3 1000 MG CAPS Take 1 g by mouth daily. (Patient not taking: Reported on 05/23/2023)     osimertinib mesylate (TAGRISSO) 40 MG tablet Take 1 tablet (40 mg total) by mouth daily. 30 tablet 1   Triamcinolone Acetonide (NASACORT AQ NA) Place 1 spray into the nose at bedtime as needed (CONGESTION).     vitamin E 180 MG (400 UNITS) capsule Take 400 Units by mouth every Monday, Wednesday, and Friday. Mon Wed Fri     No current facility-administered medications for this visit.    PHYSICAL EXAMINATION: ECOG PERFORMANCE STATUS: 1 - Symptomatic but completely ambulatory  Vitals:   06/20/23 1056  BP: (!) 161/74  Pulse: 73  Resp: 16  Temp: 98.4 F (36.9 C)  SpO2: 99%   Wt Readings from Last 3 Encounters:  06/20/23 107 lb (48.5 kg)  05/24/23 105 lb 13.1 oz (48 kg)  05/15/23 107 lb (48.5 kg)     GENERAL:alert, no distress and comfortable SKIN: skin color, texture, turgor are normal, no rashes or significant lesions EYES: normal, Conjunctiva are pink and non-injected, sclera clear NECK: supple, thyroid normal size, non-tender, without nodularity LYMPH:  no palpable lymphadenopathy in the cervical, axillary  LUNGS: clear to auscultation and percussion with normal breathing  effort HEART: regular rate & rhythm and no murmurs and no lower extremity edema ABDOMEN:abdomen soft, non-tender and normal bowel sounds Musculoskeletal:no cyanosis of digits and no clubbing  NEURO: alert & oriented x 3 with fluent speech, no focal motor/sensory deficits    LABORATORY DATA:  I have reviewed the data as listed    Latest Ref Rng & Units 06/20/2023   10:27 AM 05/24/2023    4:26 AM 05/23/2023    4:45 AM  CBC  WBC 4.0 - 10.5 K/uL 3.5  3.2  3.5   Hemoglobin 12.0 - 15.0 g/dL 59.5  63.8  75.6   Hematocrit 36.0 - 46.0 % 37.8  35.1  33.1   Platelets 150 - 400 K/uL 92  121  116         Latest Ref Rng & Units 06/20/2023   10:27 AM 05/24/2023    4:26 AM 05/23/2023    4:45 AM  CMP  Glucose 70 - 99 mg/dL 433  94  94   BUN 8 - 23 mg/dL 23  19  21    Creatinine 0.44 - 1.00 mg/dL 2.95  1.88  4.16   Sodium 135 - 145 mmol/L 140  134  137   Potassium 3.5 - 5.1 mmol/L 4.1  4.0  3.9   Chloride 98 - 111 mmol/L 102  102  101   CO2 22 - 32 mmol/L 33  24  27   Calcium 8.9 - 10.3 mg/dL 9.8  8.6  8.8   Total Protein 6.5 - 8.1 g/dL 7.0  6.0    Total Bilirubin 0.0 - 1.2 mg/dL 0.6  0.7    Alkaline Phos 38 - 126 U/L 77  66    AST 15 - 41 U/L 24  29    ALT 0 - 44 U/L 24  24        RADIOGRAPHIC STUDIES: I have personally reviewed the radiological images as listed and agreed with the findings in the report. No results  found.    No orders of the defined types were placed in this encounter.  All questions were answered. The patient knows to call the clinic with any problems, questions or concerns. No barriers to learning was detected. The total time spent in the appointment was 25 minutes.     Malachy Mood, MD 06/20/2023

## 2023-06-20 NOTE — Assessment & Plan Note (Addendum)
Stage I left lung adenocarcinoma, pT1aN0M0 in 2016, LLL recurrence in 06/2017, LUL recurrence in 10/2020, malignant left pleural effusion in 04/2022 -initially diagnosed in 11/2014, s/p LLL segmentectomy by Dr. Dorris Fetch, pathology showing stage I adenocarcinoma -She had cancer recurrence in LLL in 06/2017 and in LUL in 10/2020. She was treated with radiation in 07/2017 and in 11/2020 with Dr. Mitzi Hansen.  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  The left pleura is not hypermetabolic on the PET scan. -She previously tolerated Tagrisso 40 mg daily very well, I increased the dose to 80 mg daily but did not tolerated well, and have reduced dose back to 40 mg daily -PET 05/10/2023 showed no significant cancer progression, with minimal activity in lymph nodes and pleura. Her pleural effusion increased and required thoracentesis in early December 2024, with clinical concern for disease progression. -Due to her advanced age, she is not interested in chemotherapy

## 2023-06-24 ENCOUNTER — Other Ambulatory Visit (HOSPITAL_COMMUNITY): Payer: Self-pay | Admitting: *Deleted

## 2023-06-25 ENCOUNTER — Inpatient Hospital Stay (HOSPITAL_COMMUNITY): Admission: RE | Admit: 2023-06-25 | Payer: Medicare HMO | Source: Ambulatory Visit

## 2023-06-28 ENCOUNTER — Other Ambulatory Visit: Payer: Self-pay

## 2023-06-28 ENCOUNTER — Ambulatory Visit (HOSPITAL_COMMUNITY)
Admission: RE | Admit: 2023-06-28 | Discharge: 2023-06-28 | Disposition: A | Discharge status: Home / Self Care | Payer: Medicare HMO | Source: Ambulatory Visit | Attending: Internal Medicine | Admitting: Internal Medicine

## 2023-06-28 DIAGNOSIS — M810 Age-related osteoporosis without current pathological fracture: Secondary | ICD-10-CM | POA: Diagnosis not present

## 2023-06-28 MED ORDER — DENOSUMAB 60 MG/ML ~~LOC~~ SOSY
PREFILLED_SYRINGE | SUBCUTANEOUS | Status: AC
  Administered 2023-06-28: 60 mg via SUBCUTANEOUS
  Filled 2023-06-28: qty 1

## 2023-06-28 MED ORDER — DENOSUMAB 60 MG/ML ~~LOC~~ SOSY: 60.0000 mg | PREFILLED_SYRINGE | Freq: Once | SUBCUTANEOUS | Status: AC

## 2023-06-28 NOTE — Progress Notes (Signed)
Specialty Pharmacy Refill Coordination Note  Melissa Snyder is a 89 y.o. female contacted today regarding refills of specialty medication(s) Osimertinib Mesylate [name]   Patient requested Delivery   Delivery date: 07/10/23   Verified address: [address]   Medication will be filled on 07/09/23.

## 2023-06-29 DIAGNOSIS — Z008 Encounter for other general examination: Secondary | ICD-10-CM | POA: Diagnosis not present

## 2023-07-30 ENCOUNTER — Other Ambulatory Visit: Payer: Self-pay

## 2023-07-30 DIAGNOSIS — C3432 Malignant neoplasm of lower lobe, left bronchus or lung: Secondary | ICD-10-CM

## 2023-07-30 NOTE — Assessment & Plan Note (Signed)
 Stage I left lung adenocarcinoma, pT1aN0M0 in 2016, LLL recurrence in 06/2017, LUL recurrence in 10/2020, malignant left pleural effusion in 04/2022 -initially diagnosed in 11/2014, s/p LLL segmentectomy by Dr. Dorris Fetch, pathology showing stage I adenocarcinoma -She had cancer recurrence in LLL in 06/2017 and in LUL in 10/2020. She was treated with radiation in 07/2017 and in 11/2020 with Dr. Mitzi Hansen.  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  The left pleura is not hypermetabolic on the PET scan. -She previously tolerated Tagrisso 40 mg daily very well, I increased the dose to 80 mg daily but did not tolerated well, and have reduced dose back to 40 mg daily -PET 05/10/2023 showed no significant cancer progression, with minimal activity in lymph nodes and pleura. Her pleural effusion increased and required thoracentesis in early December 2024, with clinical concern for disease progression. -Due to her advanced age, she is not interested in chemotherapy

## 2023-07-31 ENCOUNTER — Inpatient Hospital Stay: Payer: Medicare HMO | Attending: Hematology

## 2023-07-31 ENCOUNTER — Other Ambulatory Visit: Payer: Self-pay

## 2023-07-31 ENCOUNTER — Inpatient Hospital Stay (HOSPITAL_BASED_OUTPATIENT_CLINIC_OR_DEPARTMENT_OTHER): Payer: Medicare HMO | Admitting: Hematology

## 2023-07-31 ENCOUNTER — Encounter: Payer: Self-pay | Admitting: Hematology

## 2023-07-31 VITALS — BP 148/75 | HR 74 | Temp 97.4°F | Resp 15 | Wt 108.3 lb

## 2023-07-31 DIAGNOSIS — C3432 Malignant neoplasm of lower lobe, left bronchus or lung: Secondary | ICD-10-CM

## 2023-07-31 DIAGNOSIS — Z85118 Personal history of other malignant neoplasm of bronchus and lung: Secondary | ICD-10-CM | POA: Insufficient documentation

## 2023-07-31 DIAGNOSIS — Z79899 Other long term (current) drug therapy: Secondary | ICD-10-CM | POA: Diagnosis not present

## 2023-07-31 DIAGNOSIS — C189 Malignant neoplasm of colon, unspecified: Secondary | ICD-10-CM | POA: Diagnosis not present

## 2023-07-31 DIAGNOSIS — Z853 Personal history of malignant neoplasm of breast: Secondary | ICD-10-CM | POA: Diagnosis not present

## 2023-07-31 LAB — CBC WITH DIFFERENTIAL (CANCER CENTER ONLY)
Abs Immature Granulocytes: 0 10*3/uL (ref 0.00–0.07)
Basophils Absolute: 0 10*3/uL (ref 0.0–0.1)
Basophils Relative: 1 %
Eosinophils Absolute: 0.1 10*3/uL (ref 0.0–0.5)
Eosinophils Relative: 1 %
HCT: 37.2 % (ref 36.0–46.0)
Hemoglobin: 12 g/dL (ref 12.0–15.0)
Immature Granulocytes: 0 %
Lymphocytes Relative: 15 %
Lymphs Abs: 0.6 10*3/uL — ABNORMAL LOW (ref 0.7–4.0)
MCH: 29.1 pg (ref 26.0–34.0)
MCHC: 32.3 g/dL (ref 30.0–36.0)
MCV: 90.3 fL (ref 80.0–100.0)
Monocytes Absolute: 0.3 10*3/uL (ref 0.1–1.0)
Monocytes Relative: 9 %
Neutro Abs: 2.8 10*3/uL (ref 1.7–7.7)
Neutrophils Relative %: 74 %
Platelet Count: 94 10*3/uL — ABNORMAL LOW (ref 150–400)
RBC: 4.12 MIL/uL (ref 3.87–5.11)
RDW: 14.9 % (ref 11.5–15.5)
WBC Count: 3.8 10*3/uL — ABNORMAL LOW (ref 4.0–10.5)
nRBC: 0 % (ref 0.0–0.2)

## 2023-07-31 LAB — CMP (CANCER CENTER ONLY)
ALT: 19 U/L (ref 0–44)
AST: 20 U/L (ref 15–41)
Albumin: 4.1 g/dL (ref 3.5–5.0)
Alkaline Phosphatase: 79 U/L (ref 38–126)
Anion gap: 3 — ABNORMAL LOW (ref 5–15)
BUN: 27 mg/dL — ABNORMAL HIGH (ref 8–23)
CO2: 33 mmol/L — ABNORMAL HIGH (ref 22–32)
Calcium: 9.2 mg/dL (ref 8.9–10.3)
Chloride: 102 mmol/L (ref 98–111)
Creatinine: 0.72 mg/dL (ref 0.44–1.00)
GFR, Estimated: >60 mL/min (ref >60)
Glucose, Bld: 136 mg/dL — ABNORMAL HIGH (ref 70–99)
Potassium: 4.3 mmol/L (ref 3.5–5.1)
Sodium: 138 mmol/L (ref 135–145)
Total Bilirubin: 0.6 mg/dL (ref 0.0–1.2)
Total Protein: 6.7 g/dL (ref 6.5–8.1)

## 2023-07-31 MED ORDER — OSIMERTINIB MESYLATE 40 MG PO TABS
40.0000 mg | ORAL_TABLET | Freq: Every day | ORAL | 2 refills | Status: DC
  Filled 2023-07-31 – 2023-08-01 (×2): qty 30, 30d supply, fill #0
  Filled 2023-09-05: qty 30, 30d supply, fill #1
  Filled 2023-10-04: qty 30, 30d supply, fill #2

## 2023-07-31 NOTE — Progress Notes (Signed)
 [facility]   Telephone:(336) [phone] Fax:(336) [phone]   Clinic Follow up Note   Patient Care Team: Rodrigo Ran, MD as PCP - General (Internal Medicine) Loreli Slot, MD as Consulting Physician (Cardiothoracic Surgery) Alfredo Martinez, MD as Consulting Physician (Urology) Malachy Mood, MD as Consulting Physician (Hematology) Lurline Hare, MD as Consulting Physician (Radiation Oncology) Ronny Bacon, PA-C as Physician Assistant (Radiation Oncology) Imaging, [facility] as Consulting Physician (Diagnostic Radiology)  Date of Service:  07/31/2023  CHIEF COMPLAINT: f/u of metastatic colon adenocarcinoma  CURRENT THERAPY:  Tagrisso  Oncology History   Primary cancer of left lower lobe of lung (HCC) Stage I left lung adenocarcinoma, pT1aN0M0 in 2016, LLL recurrence in 06/2017, LUL recurrence in 10/2020, malignant left pleural effusion in 04/2022 -initially diagnosed in 11/2014, s/p LLL segmentectomy by Dr. Dorris Fetch, pathology showing stage I adenocarcinoma -She had cancer recurrence in LLL in 06/2017 and in LUL in 10/2020. She was treated with radiation in 07/2017 and in 11/2020 with Dr. Mitzi Hansen.  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  The left pleura is not hypermetabolic on the PET scan. -She previously tolerated Tagrisso 40 mg daily very well, I increased the dose to 80 mg daily but did not tolerated well, and have reduced dose back to  40 mg daily -PET 05/10/2023 showed no significant cancer progression, with minimal activity in lymph nodes and pleura. Her pleural effusion increased and required thoracentesis in early December 2024, with clinical concern for disease progression. -Due to her advanced age, she is not interested in chemotherapy   Assessment and Plan    Metastatic Non-Small Cell Lung Cancer 89 year old female with metastatic non-small cell lung cancer on Tagrisso. Reports improved energy and well-managed breathing. No current issues with diarrhea or other side effects from Tagrisso. Previous fluid accumulation in September, October, and December, possibly related to pneumonia treated with antibiotics in December. Blood counts stable, no anemia, normal kidney and liver function. Last CT scan in December showed no measurable tumor but small cancer cells in pleural fluid and small nodules of uncertain significance. Discussed the importance of routine CT scans to monitor disease progression and ensure effectiveness of Tagrisso. Patient agreed to a follow-up CT scan in April. - Continue Tagrisso - Order CT scan in mid-April - Schedule follow-up appointment one week after CT scan - Advise patient to report worsening breathing or fluid accumulation for potential drainage - Ensure timely refill of Tagrisso from [facility] Patient inquired about exercise options due to difficulty walking on uneven ground. Discussed use of a stationary bike or elliptical machine for non-weight bearing exercise. - Encourage use of stationary bike or elliptical machine for exercise  Plan -She is clinically stable, overall improved energy and dyspnea on exertion. -Continue Tagrisso - Schedule follow-up appointment in eight weeks with a restaging CT scan before next visit.         SUMMARY OF ONCOLOGIC HISTORY: Oncology History Overview Note   Cancer Staging  Breast cancer of upper-outer quadrant of  right female breast [facility]) Staging form: Breast,  AJCC 7th Edition - Clinical stage from 05/19/2014: Stage IA (T1b, N0, M0) - Signed by Hubbard Hartshorn, NP on 11/17/2015 - Pathologic stage from 10/17/2014: Stage IA (T1b, N0, cM0) - Signed by Lurline Hare, MD on 10/17/2014  Primary cancer of left lower lobe of lung [facility]) Staging form: Lung, AJCC 7th Edition - Clinical stage from 12/14/2014: Stage IA (T1a, N0, M0) - Unsigned    Breast cancer of upper-outer quadrant of right female breast (HCC)  05/04/2014 Initial Diagnosis   Breast cancer   05/19/2014 Imaging   Diagnostic mammogram and ultrasound showed a 7 mm irregular suspicious mass located in the right breast at the 10:00 position, 7 cm from the nipple. Otherwise negative.   05/19/2014 Initial Biopsy   Right breast biopsy (UOQ): IDC, grade 2.    05/19/2014 Receptors her2   ER 100% positive, PR 35% positive, HER-2 negative, Ki67 8%   06/07/2014 Miscellaneous   Genetic testing normal.  APC, ATM, BARD1, BMPR1A, BRCA1, BRCA2, BRIP1, CDH1, CDK4, CDKN2A, CHEK2, EPCAM, GREM1, MLH1, MRE11A, MSH2, MSH6, MUTYH, NBN, NF1, PALB2, PMS2, POLD1, POLE, PTEN, RAD50, RAD51D, SMAD4, SMARCA4, STK11, and TP53 tested.    07/23/2014 Surgery   Right breast lumpectomy with SLNB Ezzard Standing) showed invasive ductal carcinoma & DCIS; negative surgical margins.   07/23/2014 Pathology Results   Invasive ductal carcinoma, tumor size 0.8 cm, grade 1, no lymphovascular invasion, margins were negative, 2 sentinel lymph nodes were negative. (+) DCIS.   07/23/2014 Pathologic Stage   pT1b, pN0: Stage IA    09/16/2014 - 10/07/2014 Radiation Therapy   XRT completed Michell Heinrich). Right breast/ 42.72 Gy at 2.67 Gy per fraction x 21 fractions.     Anti-estrogen oral therapy   Aromasin prescribed in 01/2015 but Patient declines anti-estrogen therapy at this time d/t high co-pay of medication and possible side effects of AI therapy.    05/02/2015 Mammogram   Diagnostic TOMO bilat  mammogram: No evidence of malignancy in either breast. Lumpectomy changes in right breast. Diagnostic mammo suggested in 1 year.    Primary cancer of left lower lobe of lung (HCC)  10/25/2013 Imaging   CXR done for stroke protocol. No acute chest findings. Chronic lung disease with biapical scarring. Developing nodule in (L) apex cannot be excluded based on exam, athough may be d/t overlap of osseous structures.    10/27/2013 Imaging   CXR: Nodular opacity in mid (L) apex and appears more prominent than prior study. Area that appeared masslike in LUL near apex on recent CXR not seen at this time.  Underlying emphysema present. No edema or consolidation   11/12/2013 Imaging   CT chest: At left lung apex, there is asymmetric pleural parenchymal scarring. No discrete lesion.  In superior segment of LLL, there are 1 dominant ground-glass opacity measuring 16 mm. 2 addt'l ground-glass opacities appreciated. F/U with CT in 3 months   02/11/2014 Imaging   CT chest: Several small bilat faint nodular densities, largest measures 1.3 cm over superior segment LLL. No new nodules, no adenopathy. Cannot exclude metastatic disease given pt's h/o melanoma. Recommend PET-CT   02/23/2014 PET scan   Persistent semi-solid nodule in superior segment LLL with low metabolic activity. Moderately metabolic subcarinal & hilar LN are likely reactive.    05/25/2014 Imaging   CT chest: Persistent & similar size dominant LLL sub-solid nodule. Adenocarcinoma is considered. Biopsy or resection strongly considered. Additional scattered small ground-glass nodules stable.    06/02/2014 Imaging   MRI brain: Grossly stable appearance of left  planum sphenoidale meningioma measuring 20 x 14 mm. Meningioma does not extend to left orbital apex & contacts left optic nerve. Stable right posterior clinoid meningioma .    09/27/2014 Imaging   CT chest: Further enlargment of central solid component in sub-solid LLL pulm nodule. Findings  remain concerning for adenoca. Additional scattered small bilat ground-glass nodules and biapical scarring stable. No adenopathy.     11/25/2014 Surgery   Video bronchoscopy with endobronchial ultrasound; Video assisted thoracoscopy; Thoracoscopic LLL superior segmentectomy; Mediastinal lymph node dissection; Cyro intercostal nerve block Dorris Fetch)   11/25/2014 Pathology Results   Superior segment lung resection (LLL). Adenocarcinoma, well-diff, spanning 1.5 cm. Surgical margins negative. 5 LNs negative.    11/25/2014 Pathologic Stage   pT1a, pN0: Stage IA    11/25/2014 Initial Diagnosis   Primary cancer of left lower lobe of lung (HCC)   05/31/2015 Imaging   CT chest: No evidence of local recurrent at superior segmentectomy in LLL. New small nodular focus of consolidation surrounding thin parenchymal band. Additional sub-cm ground-glass and solid nodules favored to be benign. No thoracic adenopathy   08/11/2015 Imaging   CT chest: No evidence of residual or recurrent tumor in LLL. Stable small nodular focus in LLL with thin surrounding parenchymal band, favored to be benign.  Multiple small ground-glass nodules unchanged.    06/19/2017 Pathology Results   Diagnosis 1. Lung, biopsy, Left lower lobe - ADENOCARCINOMA. - SEE COMMENT. 2. Lung, biopsy, Left lower lobe - BENIGN LUNG PARENCHYMA. - THERE IS NO EVIDENCE OF MALIGNANCY.   07/09/2017 - 07/22/2017 Radiation Therapy   Radiation treatment dates: 07/09/17-07/22/17 by Dr. Mitzi Hansen    Site/dose:  Left lung/ 50 Gy in 10 fractions   Beams/energy:  IMRT/ 6X   Narrative: The patient tolerated radiation treatment relatively well. She denied pain, fatigue, or difficulty swallowing. Skin to the treatment area appears warm, dry, and normal in color.   06/18/2019 Imaging   CT Chest  IMPRESSION: 1. There are 2 round solid nodules within the left lower lobe which have increased in size when compared with the previous exam, suspicious.  Additionally, along the area of postsurgical change around the oblique fissure of the left lung there is progressive thickening with persistent loculated fluid. Cannot rule out local tumor recurrence along the suture line. 2. No significant change in the appearance of bilateral upper lobe sub solid lung nodules. 3. Aortic Atherosclerosis (ICD10-I70.0). Coronary artery calcifications.     07/07/2019 PET scan   IMPRESSION: 1. No evidence of hypermetabolic local tumor recurrence in the left lung. Low level nonfocal uptake along the segmentectomy site in the left lower lobe favors post treatment change. Continued chest CT surveillance warranted. 2. Two small solid left lower lobe pulmonary nodules, largest 0.7 cm, below PET resolution. Given the growth of these nodules since 10/03/2018 chest CT, metastatic disease remains on the differential. Close chest CT surveillance recommended. 3. Nonspecific hypermetabolism within nonenlarged subcarinal and bilateral hilar lymph nodes, not substantially changed since 03/04/2017 PET-CT study, favoring reactive uptake. 4. No hypermetabolic distant metastatic disease. 5. Chronic findings include: Aortic Atherosclerosis (ICD10-I70.0). Subcentimeter ground-glass pulmonary nodules in the upper lobes are unchanged and are below PET resolution. Moderate sigmoid diverticulosis. Chronic right maxillary sinusitis.   04/13/2020 Imaging   CT Chest  IMPRESSION: 1. Primarily similar appearance of the lungs compared to 10/14/2019. Left sided surgical sutures with similar solid and subsolid pulmonary nodules as detailed above. A right middle lobe pleural based 5 mm nodule is new since the prior exam,  warranting follow-up attention. 2. No thoracic adenopathy. 3. Aortic atherosclerosis (ICD10-I70.0), coronary artery atherosclerosis and emphysema (ICD10-J43.9).   10/10/2020 Imaging   CT chest  IMPRESSION: Postsurgical and post radiation changes in the lungs  bilaterally, as above.   12 x 10 mm left lower lobe nodule, mildly progressive. Additional bilobed nodule in the posterior left lower lobe, also favored to be progressive. PET-CT is suggested for further evaluation.   Aortic Atherosclerosis (ICD10-I70.0) and Emphysema (ICD10-J43.9).   11/22/2020 - 12/02/2020 Radiation Therapy   Site Technique Total Dose (Gy) Dose per Fx (Gy) Completed Fx Beam Energies  Lung, Left: Lung_Lt_LLL IMRT 60/60 12 5/5 6XFFF     05/22/2021 Imaging   EXAM: CT CHEST WITHOUT CONTRAST  IMPRESSION: 1. Interval development of a new area of volume loss, ground-glass opacity, and bronchiectasis in the posterior left lower lobe. Imaging features are compatible with radiation therapy. 2. The posterior left lung nodules, including index 12 x 10 mm nodule described previously have become incorporated into the post treatment change on the current study. 3. Interval resolution of the posterior right middle lobe nodularity seen previously. 4. Other scattered areas of consolidative opacity or similar. 5. Trace left pleural effusion. 6. Aortic Atherosclerosis (ICD10-I70.0).      Discussed the use of AI scribe software for clinical note transcription with the patient, who gave verbal consent to proceed.  History of Present Illness   The patient, an 89 year old with metastatic non-small cell lung cancer, reports feeling better than her previous visit. She has been taking Tagrisso without any reported side effects. She reports good breathing, despite previous issues with fluid accumulation in September, October, and December. She speculates that antibiotics taken in December for pneumonia may have improved her symptoms. She still experiences fatigue but denies shortness of breath. She has also been trying to increase her physical activity, but struggles with walking uphill due to leg pain. She has ordered an elliptical machine to help with this.         All other systems  were reviewed with the patient and are negative.  MEDICAL HISTORY:  Past Medical History:  Diagnosis Date   Adenocarcinoma (HCC)    recurrent/notes 07/03/2017   Blind left eye 10/2013   cva   Breast cancer (HCC) 05/2014   ER+/PR+/Her2-     LUNG CANCER   Bundle branch block left    Chest pain    Family history of bladder cancer    Family history of breast cancer    Family history of prostate cancer    GERD (gastroesophageal reflux disease)    History of blood transfusion    age 42   Hyperlipidemia    Hypertension    Hypothyroid    Lung nodule    Melanoma in situ of face (HCC) JUNE 2015   LEFT CHEEK   Meningioma (HCC)    brain lining   Migraine    reports only having one   Osteoarthritis    Personal history of radiation therapy    Radiation 09/16/14-10/07/14   Right  Breast  21 fractions   Skin cancer    Stroke (HCC) 2015   Blind in left eye as a result    SURGICAL HISTORY: Past Surgical History:  Procedure Laterality Date   ABDOMINAL HYSTERECTOMY  ~ 1997   APPENDECTOMY     age 68   BILATERAL SALPINGOOPHORECTOMY     BLADDER REPAIR  2009   cystocele   BRAIN MENINGIOMA EXCISION     Gamma knife to  Meningioma in brain lining   BREAST BIOPSY     BREAST LUMPECTOMY Right 07/23/2014   invasive ductal   BUNIONECTOMY     bilateral great toe   CARDIAC CATHETERIZATION  2004   CHOLECYSTECTOMY  ~ 1997   COLONOSCOPY     CRYO INTERCOSTAL NERVE BLOCK Left 11/25/2014   Procedure: CRYO INTERCOSTAL NERVE BLOCK;  Surgeon: Loreli Slot, MD;  Location: [facility] OR;  Service: Thoracic;  Laterality: Left;   CT RADIATION THERAPY GUIDE     Gamma radiation -lt frontal-[facility]   DILATION AND CURETTAGE OF UTERUS     X 2   HIP ARTHROPLASTY Left 07/04/2017   Procedure: ARTHROPLASTY BIPOLAR HIP (HEMIARTHROPLASTY);  Surgeon: Jones Broom, MD;  Location: [facility] OR;  Service: Orthopedics;  Laterality: Left;   IR THORACENTESIS ASP PLEURAL SPACE W/IMG GUIDE  02/27/2023   MELANOMA EXCISION Left  11/05/23, 11/30/13   CHEEK   SEGMENTECOMY Left 11/25/2014   Procedure: LEFT LOWER LOBE SEGMENTECTOMY;  Surgeon: Loreli Slot, MD;  Location: [facility] OR;  Service: Thoracic;  Laterality: Left;   VIDEO ASSISTED THORACOSCOPY Left 11/25/2014   Procedure: VIDEO ASSISTED THORACOSCOPY;  Surgeon: Loreli Slot, MD;  Location: [facility] OR;  Service: Thoracic;  Laterality: Left;   VIDEO BRONCHOSCOPY WITH ENDOBRONCHIAL NAVIGATION N/A 06/19/2017   Procedure: VIDEO BRONCHOSCOPY;  Surgeon: Loreli Slot, MD;  Location: [facility] OR;  Service: Thoracic;  Laterality: N/A;   VIDEO BRONCHOSCOPY WITH ENDOBRONCHIAL ULTRASOUND N/A 11/25/2014   Procedure: VIDEO BRONCHOSCOPY WITH ENDOBRONCHIAL ULTRASOUND;  Surgeon: Loreli Slot, MD;  Location: [facility] OR;  Service: Thoracic;  Laterality: N/A;    I have reviewed the social history and family history with the patient and they are unchanged from previous note.  ALLERGIES:  is allergic to beef-derived drug products, other, chocolate, ibuprofen, niacin and related, and tramadol.  MEDICATIONS:  Current Outpatient Medications  Medication Sig Dispense Refill   aspirin 81 MG EC tablet Take 81 mg by mouth at bedtime.     atenolol (TENORMIN) 50 MG tablet Take 50 mg by mouth at bedtime.      b complex vitamins capsule Take 1 capsule by mouth daily.     BINAXNOW COVID-19 AG HOME TEST KIT See admin instructions.     calcium-vitamin D (OSCAL WITH D) 500-200 MG-UNIT per tablet Take 1 tablet by mouth at bedtime.      Cholecalciferol (VITAMIN D3) 1000 units CAPS Take 1,000 Units by mouth at bedtime.     co-enzyme Q-10 30 MG capsule Take 30 mg by mouth daily.     denosumab (PROLIA) 60 MG/ML SOSY injection Inject 60 mg into the skin every 6 (six) months.     Digestive Enzymes (ENZYME DIGEST PO) Take 5 mLs by mouth 3 (three) times daily with meals.     FLUZONE HIGH-DOSE QUADRIVALENT 0.7 ML SUSY      levothyroxine (SYNTHROID, LEVOTHROID) 112 MCG tablet Take 112 mcg by mouth daily  before breakfast.      lisinopril (PRINIVIL,ZESTRIL) 10 MG tablet Take 1 tablet (10 mg total) by mouth daily. (Patient taking differently: Take 10 mg by mouth 2 (two) times daily.) 30 tablet 1   meclizine (ANTIVERT) 12.5 MG tablet Take 12.5 mg by mouth 3 (three) times daily as needed for dizziness.     Multiple Vitamin (MULTIVITAMIN) tablet Take 1 tablet by mouth every morning.      nystatin (MYCOSTATIN) 100000 UNIT/ML suspension Take 5 mLs by mouth 4 (four) times daily.     Omega-3 1000  MG CAPS Take 1 g by mouth daily. (Patient not taking: Reported on 05/23/2023)     osimertinib mesylate (TAGRISSO) 40 MG tablet Take 1 tablet (40 mg total) by mouth daily. 30 tablet 2   Triamcinolone Acetonide (NASACORT AQ NA) Place 1 spray into the nose at bedtime as needed (CONGESTION).     vitamin E 180 MG (400 UNITS) capsule Take 400 Units by mouth every Monday, Wednesday, and Friday. Mon Wed Fri     No current facility-administered medications for this visit.    PHYSICAL EXAMINATION: ECOG PERFORMANCE STATUS: 1 - Symptomatic but completely ambulatory  Vitals:   07/31/23 1157  BP: (!) 148/75  Pulse: 74  Resp: 15  Temp: (!) 97.4 F (36.3 C)  SpO2: 100%   Wt Readings from Last 3 Encounters:  07/31/23 108 lb 4.8 oz (49.1 kg)  06/20/23 107 lb (48.5 kg)  05/24/23 105 lb 13.1 oz (48 kg)     GENERAL:alert, no distress and comfortable SKIN: skin color, texture, turgor are normal, no rashes or significant lesions EYES: normal, Conjunctiva are pink and non-injected, sclera clear NECK: supple, thyroid normal size, non-tender, without nodularity LYMPH:  no palpable lymphadenopathy in the cervical, axillary  LUNGS: clear to auscultation and percussion with normal breathing effort HEART: regular rate & rhythm and no murmurs and no lower extremity edema ABDOMEN:abdomen soft, non-tender and normal bowel sounds Musculoskeletal:no cyanosis of digits and no clubbing  NEURO: alert & oriented x 3 with fluent  speech, no focal motor/sensory deficits   LABORATORY DATA:  I have reviewed the data as listed    Latest Ref Rng & Units 07/31/2023   11:27 AM 06/20/2023   10:27 AM 05/24/2023    4:26 AM  CBC  WBC 4.0 - 10.5 K/uL 3.8  3.5  3.2   Hemoglobin 12.0 - 15.0 g/dL 16.1  09.6  04.5   Hematocrit 36.0 - 46.0 % 37.2  37.8  35.1   Platelets 150 - 400 K/uL 94  92  121         Latest Ref Rng & Units 07/31/2023   11:27 AM 06/20/2023   10:27 AM 05/24/2023    4:26 AM  CMP  Glucose 70 - 99 mg/dL 409  811  94   BUN 8 - 23 mg/dL 27  23  19    Creatinine 0.44 - 1.00 mg/dL 9.14  7.82  9.56   Sodium 135 - 145 mmol/L 138  140  134   Potassium 3.5 - 5.1 mmol/L 4.3  4.1  4.0   Chloride 98 - 111 mmol/L 102  102  102   CO2 22 - 32 mmol/L 33  33  24   Calcium 8.9 - 10.3 mg/dL 9.2  9.8  8.6   Total Protein 6.5 - 8.1 g/dL 6.7  7.0  6.0   Total Bilirubin 0.0 - 1.2 mg/dL 0.6  0.6  0.7   Alkaline Phos 38 - 126 U/L 79  77  66   AST 15 - 41 U/L 20  24  29    ALT 0 - 44 U/L 19  24  24        RADIOGRAPHIC STUDIES: I have personally reviewed the radiological images as listed and agreed with the findings in the report. No results found.    Orders Placed This Encounter  Procedures   CT CHEST ABDOMEN PELVIS W CONTRAST    Standing Status:   Future    Expected Date:   09/18/2023    Expiration Date:  07/30/2024    If indicated for the ordered procedure, I authorize the administration of contrast media per Radiology protocol:   Yes    Does the patient have a contrast media/X-ray dye allergy?:   No    Preferred imaging location?:   [facility]    If indicated for the ordered procedure, I authorize the administration of oral contrast media per Radiology protocol:   Yes   All questions were answered. The patient knows to call the clinic with any problems, questions or concerns. No barriers to learning was detected. The total time spent in the appointment was 30 minutes.     Malachy Mood, MD 07/31/2023

## 2023-08-01 ENCOUNTER — Other Ambulatory Visit: Payer: Self-pay

## 2023-08-01 ENCOUNTER — Other Ambulatory Visit (HOSPITAL_COMMUNITY): Payer: Self-pay

## 2023-08-01 NOTE — Progress Notes (Signed)
 Specialty Pharmacy Refill Coordination Note  Melissa Snyder is a 89 y.o. female contacted today regarding refills of specialty medication(s) Osimertinib Mesylate [name]   Patient requested Delivery   Delivery date: 08/08/23   Verified address: [address]   Medication will be filled on 08/07/23.

## 2023-08-07 ENCOUNTER — Other Ambulatory Visit: Payer: Self-pay

## 2023-08-12 DIAGNOSIS — E039 Hypothyroidism, unspecified: Secondary | ICD-10-CM | POA: Diagnosis not present

## 2023-08-12 DIAGNOSIS — C50919 Malignant neoplasm of unspecified site of unspecified female breast: Secondary | ICD-10-CM | POA: Diagnosis not present

## 2023-08-12 DIAGNOSIS — M810 Age-related osteoporosis without current pathological fracture: Secondary | ICD-10-CM | POA: Diagnosis not present

## 2023-08-12 DIAGNOSIS — I10 Essential (primary) hypertension: Secondary | ICD-10-CM | POA: Diagnosis not present

## 2023-08-12 DIAGNOSIS — C3490 Malignant neoplasm of unspecified part of unspecified bronchus or lung: Secondary | ICD-10-CM | POA: Diagnosis not present

## 2023-08-12 DIAGNOSIS — I479 Paroxysmal tachycardia, unspecified: Secondary | ICD-10-CM | POA: Diagnosis not present

## 2023-08-12 DIAGNOSIS — S86899A Other injury of other muscle(s) and tendon(s) at lower leg level, unspecified leg, initial encounter: Secondary | ICD-10-CM | POA: Diagnosis not present

## 2023-08-26 ENCOUNTER — Encounter (HOSPITAL_COMMUNITY): Payer: Self-pay

## 2023-08-26 ENCOUNTER — Emergency Department (HOSPITAL_COMMUNITY)

## 2023-08-26 ENCOUNTER — Emergency Department (HOSPITAL_COMMUNITY): Admission: EM | Admit: 2023-08-26 | Discharge: 2023-08-26 | Disposition: A | Discharge status: Home / Self Care

## 2023-08-26 ENCOUNTER — Other Ambulatory Visit: Payer: Self-pay

## 2023-08-26 DIAGNOSIS — Z79899 Other long term (current) drug therapy: Secondary | ICD-10-CM | POA: Insufficient documentation

## 2023-08-26 DIAGNOSIS — M19012 Primary osteoarthritis, left shoulder: Secondary | ICD-10-CM | POA: Diagnosis not present

## 2023-08-26 DIAGNOSIS — M25512 Pain in left shoulder: Secondary | ICD-10-CM | POA: Diagnosis not present

## 2023-08-26 DIAGNOSIS — Z7982 Long term (current) use of aspirin: Secondary | ICD-10-CM | POA: Insufficient documentation

## 2023-08-26 DIAGNOSIS — M545 Low back pain, unspecified: Secondary | ICD-10-CM | POA: Diagnosis not present

## 2023-08-26 DIAGNOSIS — M549 Dorsalgia, unspecified: Secondary | ICD-10-CM | POA: Diagnosis not present

## 2023-08-26 DIAGNOSIS — M79602 Pain in left arm: Secondary | ICD-10-CM | POA: Diagnosis not present

## 2023-08-26 DIAGNOSIS — M19022 Primary osteoarthritis, left elbow: Secondary | ICD-10-CM | POA: Diagnosis not present

## 2023-08-26 DIAGNOSIS — R918 Other nonspecific abnormal finding of lung field: Secondary | ICD-10-CM | POA: Diagnosis not present

## 2023-08-26 DIAGNOSIS — M25522 Pain in left elbow: Secondary | ICD-10-CM | POA: Diagnosis not present

## 2023-08-26 LAB — BASIC METABOLIC PANEL
Anion gap: 8 (ref 5–15)
BUN: 20 mg/dL (ref 8–23)
CO2: 27 mmol/L (ref 22–32)
Calcium: 9.1 mg/dL (ref 8.9–10.3)
Chloride: 103 mmol/L (ref 98–111)
Creatinine, Ser: 0.78 mg/dL (ref 0.44–1.00)
GFR, Estimated: >60 mL/min (ref >60)
Glucose, Bld: 151 mg/dL — ABNORMAL HIGH (ref 70–99)
Potassium: 4 mmol/L (ref 3.5–5.1)
Sodium: 138 mmol/L (ref 135–145)

## 2023-08-26 LAB — CBC
HCT: 36.7 % (ref 36.0–46.0)
Hemoglobin: 12 g/dL (ref 12.0–15.0)
MCH: 29.4 pg (ref 26.0–34.0)
MCHC: 32.7 g/dL (ref 30.0–36.0)
MCV: 90 fL (ref 80.0–100.0)
Platelets: 88 10*3/uL — ABNORMAL LOW (ref 150–400)
RBC: 4.08 MIL/uL (ref 3.87–5.11)
RDW: 14.7 % (ref 11.5–15.5)
WBC: 3.7 10*3/uL — ABNORMAL LOW (ref 4.0–10.5)
nRBC: 0 % (ref 0.0–0.2)

## 2023-08-26 LAB — TROPONIN I (HIGH SENSITIVITY)
Troponin I (High Sensitivity): 7 ng/L (ref <18)
Troponin I (High Sensitivity): 8 ng/L (ref <18)

## 2023-08-26 LAB — URINALYSIS, ROUTINE W REFLEX MICROSCOPIC
Bilirubin Urine: NEGATIVE
Glucose, UA: NEGATIVE mg/dL
Ketones, ur: NEGATIVE mg/dL
Leukocytes,Ua: NEGATIVE
Nitrite: NEGATIVE
Protein, ur: NEGATIVE mg/dL
Specific Gravity, Urine: 1.015 (ref 1.005–1.030)
pH: 5.5 (ref 5.0–8.0)

## 2023-08-26 LAB — URINALYSIS, MICROSCOPIC (REFLEX): Bacteria, UA: NONE SEEN

## 2023-08-26 MED ORDER — MELOXICAM 7.5 MG PO TABS: 7.5000 mg | ORAL_TABLET | Freq: Every day | ORAL | 0 refills | Status: DC

## 2023-08-26 NOTE — ED Notes (Signed)
 Patient transported to X-ray

## 2023-08-26 NOTE — ED Triage Notes (Signed)
 Pt c/o pain in left shoulder down to left armx3d. Pt c/o left lower back pain started last night. Pt denies injury. Pt c/o frequent urination started last night.

## 2023-08-26 NOTE — Discharge Instructions (Addendum)
Return if any problems.  See your Physician for recheck  °

## 2023-08-26 NOTE — ED Notes (Signed)
 Awaiting patient from lobby.

## 2023-08-26 NOTE — ED Provider Notes (Addendum)
 [place] [facility]PARTMENT AT [facility] Provider Note   CSN: [id number] Arrival date & time: 08/26/23  1027     History  Chief Complaint  Patient presents with   Arm Pain   Back Pain    Melissa Snyder is a 89 y.o. female.  Patient complains of pain to her left shoulder.  Patient reports that she has a chest of drawers beside her bed and that she has noticed pain when she reaches up from her bed.  Patient first noticed symptoms a week ago.  Patient reports that the pain seems to be getting worse.  Patient has pain with movement but also has pain in her shoulder at rest.  Patient states she has not had any chest pain she has not had any shortness of breath.  Patient denies any cough or congestion she has not had any fever or chills.  Patient states nothing seems to make the pain better or worse  The history is provided by the patient. No language interpreter was used.  Arm Pain  Back Pain      Home Medications Prior to Admission medications   Medication Sig Start Date End Date Taking? Authorizing Provider  aspirin 81 MG EC tablet Take 81 mg by mouth at bedtime. 05/09/09   [provider]  atenolol (TENORMIN) 50 MG tablet Take 50 mg by mouth at bedtime.     [provider]  b complex vitamins capsule Take 1 capsule by mouth daily.    [provider]  calcium-vitamin D (OSCAL WITH D) 500-200 MG-UNIT per tablet Take 1 tablet by mouth at bedtime.     [provider]  Cholecalciferol (VITAMIN D3) 1000 units CAPS Take 1,000 Units by mouth at bedtime.    [provider]  co-enzyme Q-10 30 MG capsule Take 30 mg by mouth daily.    [provider]  denosumab (PROLIA) 60 MG/ML SOSY injection Inject 60 mg into the skin every 6 (six) months. 11/03/15   [provider]  Digestive Enzymes (ENZYME DIGEST PO) Take 5 mLs by mouth 3 (three) times daily with meals.    [provider]  levothyroxine (SYNTHROID,  LEVOTHROID) 112 MCG tablet Take 112 mcg by mouth daily before breakfast.     [provider]  lisinopril (PRINIVIL,ZESTRIL) 10 MG tablet Take 1 tablet (10 mg total) by mouth daily. Patient taking differently: Take 10 mg by mouth 2 (two) times daily. 11/29/14   Gold, Glenice Laine, PA-C  meclizine (ANTIVERT) 12.5 MG tablet Take 12.5 mg by mouth 3 (three) times daily as needed for dizziness.    [provider]  Multiple Vitamin (MULTIVITAMIN) tablet Take 1 tablet by mouth every morning.     [provider]  nystatin (MYCOSTATIN) 100000 UNIT/ML suspension Take 5 mLs by mouth 4 (four) times daily. 05/15/23   [provider]  Omega-3 1000 MG CAPS Take 1 g by mouth daily. Patient not taking: Reported on 05/23/2023    [provider]  osimertinib mesylate (TAGRISSO) 40 MG tablet Take 1 tablet (40 mg total) by mouth daily. 07/31/23   Malachy Mood, MD  Triamcinolone Acetonide (NASACORT AQ NA) Place 1 spray into the nose at bedtime as needed (CONGESTION).    [provider]  vitamin E 180 MG (400 UNITS) capsule Take 400 Units by mouth every Monday, Wednesday, and Friday. Mon Wed Fri    [provider]      Allergies    Beef-derived drug products,  Other, Chocolate, Ibuprofen, Niacin and related, and Tramadol    Review of Systems   Review of Systems  Musculoskeletal:  Positive for back pain.  All other systems reviewed and are negative.   Physical Exam Updated Vital Signs BP (!) 153/65   Pulse 63   Temp 97.7 F (36.5 C) (Oral)   Resp 17   Ht 5\' 5"  (1.651 m)   Wt 49.1 kg   SpO2 100%   BMI 18.01 kg/m  Physical Exam Vitals and nursing note reviewed.  Constitutional:      Appearance: She is well-developed.  HENT:     Head: Normocephalic.  Cardiovascular:     Rate and Rhythm: Normal rate.  Pulmonary:     Effort: Pulmonary effort is normal.  Abdominal:     General: There is no distension.  Musculoskeletal:        General: No swelling or  tenderness. Normal range of motion.     Cervical back: Normal range of motion and neck supple. No tenderness.     Comments: No rash no deformity  Skin:    General: Skin is warm.  Neurological:     General: No focal deficit present.     Mental Status: She is alert and oriented to person, place, and time.  Psychiatric:        Mood and Affect: Mood normal.     ED Results / Procedures / Treatments   Labs (all labs ordered are listed, but only abnormal results are displayed) Labs Reviewed  BASIC METABOLIC PANEL - Abnormal; Notable for the following components:      Result Value   Glucose, Bld 151 (*)    All other components within normal limits  CBC - Abnormal; Notable for the following components:   WBC 3.7 (*)    Platelets 88 (*)    All other components within normal limits  URINALYSIS, ROUTINE W REFLEX MICROSCOPIC - Abnormal; Notable for the following components:   Hgb urine dipstick TRACE (*)    All other components within normal limits  URINALYSIS, MICROSCOPIC (REFLEX)  TROPONIN I (HIGH SENSITIVITY)  TROPONIN I (HIGH SENSITIVITY)    EKG None  Radiology DG Elbow Complete Left Result Date: 08/26/2023 CLINICAL DATA:  pain. EXAM: LEFT ELBOW - COMPLETE 3+ VIEW COMPARISON:  None Available. FINDINGS: No acute fracture or dislocation. No aggressive osseous lesion. Mild degenerative arthritis of elbow joint. No radiopaque foreign bodies. Soft tissues are within normal limits. IMPRESSION: *No acute osseous abnormality of the left elbow joint. Electronically Signed   By: Jules Schick M.D.   On: 08/26/2023 12:24   DG Shoulder Left Result Date: 08/26/2023 CLINICAL DATA:  pain. EXAM: LEFT SHOULDER - 2+ VIEW COMPARISON:  None Available. FINDINGS: No acute fracture or dislocation. No aggressive osseous lesion. Glenohumeral and acromioclavicular joints are normal in alignment. Moderate osteoarthritis of the acromioclavicular joint. Mild osteoarthritis of the glenohumeral joint. No soft  tissue swelling. No radiopaque foreign bodies. IMPRESSION: No acute osseous abnormality of the left shoulder joint. Electronically Signed   By: Jules Schick M.D.   On: 08/26/2023 12:23   DG Chest 2 View Result Date: 08/26/2023 CLINICAL DATA:  left arm pain.  Low back pain.  Left shoulder pain. EXAM: CHEST - 2 VIEW COMPARISON:  05/22/2023. FINDINGS: Postsurgical changes noted overlying the left mid lung zone. Re-demonstration of left retrocardiac airspace opacity obscuring the left hemidiaphragm, descending thoracic aorta and blunting the left lateral costophrenic angle, suggesting combination of left lung atelectasis and/or consolidation with pleural  effusion. No significant interval change. Bilateral lung fields are otherwise clear. Right costophrenic angle is clear. Stable cardio-mediastinal silhouette. No acute osseous abnormalities. The soft tissues are within normal limits. IMPRESSION: *Persistent left retrocardiac opacity, as described above. No significant interval change. Electronically Signed   By: Jules Schick M.D.   On: 08/26/2023 12:22    Procedures Procedures    Medications Ordered in ED Medications - No data to display  ED Course/ Medical Decision Making/ A&P                                 Medical Decision Making Patient complains of pain in her left shoulder and her left elbow.  Patient has pain with movement and at rest.  Amount and/or Complexity of Data Reviewed Labs: ordered. Decision-making details documented in ED Course.    Details: UA is negative troponin is negative CBC and chemistries are normal Radiology: ordered.    Details: Chest x-ray shows no acute changes x-ray left shoulder and left elbow no acute abnormality ECG/medicine tests: ordered and independent interpretation performed. Decision-making details documented in ED Course.    Details: EKG normal sinus normal EKG  Risk Prescription drug management. Risk Details: Patient is counseled on results.   Patient states she has been taking Tylenol without relief.  I will try patient on a short course of meloxicam.  She is advised to make an appointment with her physician for recheck.  Patient is advised to return to the emergency department if any problems.           Final Clinical Impression(s) / ED Diagnoses Final diagnoses:  Acute pain of left shoulder    Rx / DC Orders ED Discharge Orders          Ordered    meloxicam (MOBIC) 7.5 MG tablet  Daily        08/26/23 1442          An After Visit Summary was printed and given to the patient.     Elson Areas, PA-C 08/26/23 1442    Elson Areas, [place] 08/26/23 1444    Durwin Glaze, MD 08/26/23 [phone]

## 2023-08-27 DIAGNOSIS — M25512 Pain in left shoulder: Secondary | ICD-10-CM | POA: Diagnosis not present

## 2023-08-27 DIAGNOSIS — I10 Essential (primary) hypertension: Secondary | ICD-10-CM | POA: Diagnosis not present

## 2023-08-27 DIAGNOSIS — M62830 Muscle spasm of back: Secondary | ICD-10-CM | POA: Diagnosis not present

## 2023-09-02 DIAGNOSIS — L218 Other seborrheic dermatitis: Secondary | ICD-10-CM | POA: Diagnosis not present

## 2023-09-03 ENCOUNTER — Other Ambulatory Visit: Payer: Self-pay

## 2023-09-05 ENCOUNTER — Other Ambulatory Visit (HOSPITAL_COMMUNITY): Payer: Self-pay

## 2023-09-05 NOTE — Progress Notes (Signed)
 Specialty Pharmacy Refill Coordination Note  Melissa Snyder is a 89 y.o. female contacted today regarding refills of specialty medication(s) Osimertinib Mesylate [name]   Patient requested Delivery   Delivery date: 09/10/23   Verified address: [address], [place], [place]   Medication will be filled on 09/09/23.

## 2023-09-09 ENCOUNTER — Other Ambulatory Visit: Payer: Self-pay

## 2023-09-17 ENCOUNTER — Other Ambulatory Visit: Payer: Self-pay

## 2023-09-17 DIAGNOSIS — Z170 Estrogen receptor positive status [ER+]: Secondary | ICD-10-CM

## 2023-09-18 ENCOUNTER — Ambulatory Visit (HOSPITAL_COMMUNITY)
Admission: RE | Admit: 2023-09-18 | Discharge: 2023-09-18 | Disposition: A | Discharge status: Home / Self Care | Source: Ambulatory Visit | Attending: Hematology | Admitting: Hematology

## 2023-09-18 ENCOUNTER — Encounter (HOSPITAL_COMMUNITY): Payer: Self-pay

## 2023-09-18 ENCOUNTER — Inpatient Hospital Stay: Payer: Medicare HMO | Attending: Hematology

## 2023-09-18 DIAGNOSIS — Z170 Estrogen receptor positive status [ER+]: Secondary | ICD-10-CM

## 2023-09-18 DIAGNOSIS — J90 Pleural effusion, not elsewhere classified: Secondary | ICD-10-CM | POA: Diagnosis not present

## 2023-09-18 DIAGNOSIS — Z853 Personal history of malignant neoplasm of breast: Secondary | ICD-10-CM | POA: Insufficient documentation

## 2023-09-18 DIAGNOSIS — D6959 Other secondary thrombocytopenia: Secondary | ICD-10-CM | POA: Insufficient documentation

## 2023-09-18 DIAGNOSIS — C3432 Malignant neoplasm of lower lobe, left bronchus or lung: Secondary | ICD-10-CM | POA: Insufficient documentation

## 2023-09-18 DIAGNOSIS — K5730 Diverticulosis of large intestine without perforation or abscess without bleeding: Secondary | ICD-10-CM | POA: Diagnosis not present

## 2023-09-18 DIAGNOSIS — Z79899 Other long term (current) drug therapy: Secondary | ICD-10-CM | POA: Insufficient documentation

## 2023-09-18 DIAGNOSIS — J910 Malignant pleural effusion: Secondary | ICD-10-CM | POA: Insufficient documentation

## 2023-09-18 LAB — CMP (CANCER CENTER ONLY)
ALT: 24 U/L (ref 0–44)
AST: 25 U/L (ref 15–41)
Albumin: 4.3 g/dL (ref 3.5–5.0)
Alkaline Phosphatase: 74 U/L (ref 38–126)
Anion gap: 4 — ABNORMAL LOW (ref 5–15)
BUN: 26 mg/dL — ABNORMAL HIGH (ref 8–23)
CO2: 32 mmol/L (ref 22–32)
Calcium: 9.5 mg/dL (ref 8.9–10.3)
Chloride: 103 mmol/L (ref 98–111)
Creatinine: 0.76 mg/dL (ref 0.44–1.00)
GFR, Estimated: >60 mL/min (ref >60)
Glucose, Bld: 119 mg/dL — ABNORMAL HIGH (ref 70–99)
Potassium: 4.2 mmol/L (ref 3.5–5.1)
Sodium: 139 mmol/L (ref 135–145)
Total Bilirubin: 0.6 mg/dL (ref 0.0–1.2)
Total Protein: 7 g/dL (ref 6.5–8.1)

## 2023-09-18 LAB — CBC WITH DIFFERENTIAL (CANCER CENTER ONLY)
Abs Immature Granulocytes: 0.01 10*3/uL (ref 0.00–0.07)
Basophils Absolute: 0 10*3/uL (ref 0.0–0.1)
Basophils Relative: 1 %
Eosinophils Absolute: 0.1 10*3/uL (ref 0.0–0.5)
Eosinophils Relative: 2 %
HCT: 37.4 % (ref 36.0–46.0)
Hemoglobin: 12.3 g/dL (ref 12.0–15.0)
Immature Granulocytes: 0 %
Lymphocytes Relative: 14 %
Lymphs Abs: 0.6 10*3/uL — ABNORMAL LOW (ref 0.7–4.0)
MCH: 29.1 pg (ref 26.0–34.0)
MCHC: 32.9 g/dL (ref 30.0–36.0)
MCV: 88.6 fL (ref 80.0–100.0)
Monocytes Absolute: 0.4 10*3/uL (ref 0.1–1.0)
Monocytes Relative: 9 %
Neutro Abs: 3.1 10*3/uL (ref 1.7–7.7)
Neutrophils Relative %: 74 %
Platelet Count: 102 10*3/uL — ABNORMAL LOW (ref 150–400)
RBC: 4.22 MIL/uL (ref 3.87–5.11)
RDW: 14.2 % (ref 11.5–15.5)
WBC Count: 4.2 10*3/uL (ref 4.0–10.5)
nRBC: 0 % (ref 0.0–0.2)

## 2023-09-18 MED ORDER — IOHEXOL 9 MG/ML PO SOLN: 500.0000 mL | ORAL | Status: DC

## 2023-09-18 MED ORDER — IOHEXOL 300 MG/ML  SOLN
100.0000 mL | Freq: Once | INTRAMUSCULAR | Status: AC | PRN
  Administered 2023-09-18: 100 mL via INTRAVENOUS

## 2023-09-18 MED ORDER — IOHEXOL 9 MG/ML PO SOLN
ORAL | Status: AC
Start: 2023-09-18 — End: ?
  Filled 2023-09-18: qty 1000

## 2023-09-18 MED ORDER — SODIUM CHLORIDE (PF) 0.9 % IJ SOLN
INTRAMUSCULAR | Status: AC
Start: 2023-09-18 — End: ?
  Filled 2023-09-18: qty 50

## 2023-09-24 NOTE — Assessment & Plan Note (Signed)
 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 Luna Salinas, pathology showing stage I adenocarcinoma -She had cancer recurrence in LLL in 06/2017 and in LUL in 10/2020. She was treated with radiation in 07/2017 and in 11/2020 with Dr. [name].  -She developed cancer recurrence with malignant left pleural effusion in November 2023 -[facility] showed EGFR L858R mutation(+), which indicating good response to EGFR inhibitor, I have started her on osimertinib  in Jan 2024 -she has slightly prolonged QTc, will monitor closely when she is on Osimertinib , repeated EKG showed normal QTc --PET scan 08/02/2022 showed mildly hypermetabolic right upper lung nodule, morphologically benign appearing mediastinal and hilar lymphadenopathy with metabolic activities, no other definitive metastasis.  The left pleura is not hypermetabolic on the PET scan. -She previously tolerated Tagrisso  40 mg daily very well, I increased the dose to 80 mg daily but did not tolerated well, and have reduced dose back to 40 mg daily -PET 05/10/2023 showed no significant cancer progression, with minimal activity in lymph nodes and pleura. Her pleural effusion increased and required thoracentesis in early December 2024, with clinical concern for disease progression. -Due to her advanced age, she is not interested in chemotherapy -restaging CT 09/18/2023 showed persistent small left pleural effusion, no evidence of progression, will continue current therapy

## 2023-09-25 ENCOUNTER — Inpatient Hospital Stay: Payer: Medicare HMO | Admitting: Hematology

## 2023-09-25 ENCOUNTER — Encounter: Payer: Self-pay | Admitting: Hematology

## 2023-09-25 VITALS — BP 152/62 | HR 70 | Temp 97.8°F | Resp 23 | Ht 65.0 in | Wt 106.9 lb

## 2023-09-25 DIAGNOSIS — Z853 Personal history of malignant neoplasm of breast: Secondary | ICD-10-CM | POA: Diagnosis not present

## 2023-09-25 DIAGNOSIS — C3432 Malignant neoplasm of lower lobe, left bronchus or lung: Secondary | ICD-10-CM | POA: Diagnosis not present

## 2023-09-25 DIAGNOSIS — D6959 Other secondary thrombocytopenia: Secondary | ICD-10-CM | POA: Diagnosis not present

## 2023-09-25 DIAGNOSIS — Z79899 Other long term (current) drug therapy: Secondary | ICD-10-CM | POA: Diagnosis not present

## 2023-09-25 DIAGNOSIS — J910 Malignant pleural effusion: Secondary | ICD-10-CM | POA: Diagnosis not present

## 2023-09-25 NOTE — Progress Notes (Signed)
 Melissa Snyder   [phone] [phone]   Clinic Follow up Note   Patient Care Team: Melissa Hun, MD as PCP - General (Internal Medicine) Melissa Higashi, MD as Consulting Physician (Cardiothoracic Surgery) Melissa Hayward, MD as Consulting Physician (Urology) Melissa San Jon, MD as Consulting Physician (Hematology) Melissa Bucco, MD as Consulting Physician (Radiation Oncology) Melissa Brownie, PA-C as Physician Assistant (Radiation Oncology) Imaging, [facility] as Consulting Physician (Diagnostic Radiology)  Date of Service:  09/25/2023  CHIEF COMPLAINT: f/u of metastatic lung adenocarcinoma  CURRENT THERAPY:  Tagrisso   Oncology History   Primary cancer of left lower lobe of lung (HCC) Stage I left lung adenocarcinoma, pT1aN0M0 in 2016, LLL recurrence in 06/2017, LUL recurrence in 10/2020, malignant left pleural effusion in 04/2022, EGFR L858R mutation(+) -initially diagnosed in 11/2014, s/p LLL segmentectomy by Dr. Luna [facility], pathology showing stage I adenocarcinoma -She had cancer recurrence in LLL in 06/2017 and in LUL in 10/2020. She was treated with radiation in 07/2017 and in 11/2020 with Dr. Jeryl Snyder.  -She developed cancer recurrence with malignant left pleural effusion in November 2023 -[facility] showed EGFR L858R mutation(+), which indicating good response to EGFR inhibitor, I have started her on osimertinib  in Jan 2024 -she has slightly prolonged QTc, will monitor closely when she is on Osimertinib , repeated EKG showed normal QTc --PET scan 08/02/2022 showed mildly hypermetabolic right upper lung nodule, morphologically benign appearing mediastinal and hilar lymphadenopathy with metabolic activities, no other definitive metastasis.  The left pleura is not hypermetabolic on the PET scan. -She previously tolerated Tagrisso  40 mg daily very well, I increased the dose to 80 mg daily but did not tolerated well, and  have reduced dose back to 40 mg daily -PET 05/10/2023 showed no significant cancer progression, with minimal activity in lymph nodes and pleura. Her pleural effusion increased and required thoracentesis in early December 2024, with clinical concern for disease progression. -Due to her advanced age, she is not interested in chemotherapy -restaging CT 09/18/2023 showed persistent small left pleural effusion, no evidence of progression, will continue current therapy  Assessment & Plan Non-small cell lung cancer Non-small cell lung cancer is well-managed with no new lesions on recent CT scan. Symptoms remain stable with no significant changes in dyspnea or cough. Appetite is fair, and weight is stable. She is able to perform routine activities with some limitations in ambulation. - Continue current management and monitoring - Provide copy of CT scan report to her - Schedule next CT scan in 4 to 6 months depending on symptoms - Follow up in 2 months unless new symptoms arise  Thrombocytopenia due to Tagrisso  Mild thrombocytopenia attributed to Tagrisso  is present and stable. Blood counts show slightly low platelet levels, but no significant adverse effects reported. She denies any new symptoms such as nausea, diarrhea, or rash. - Continue Tagrisso  therapy - Monitor blood counts regularly - Instruct her to contact pharmacy for refills and notify provider if issues arise  Fatty liver Fatty liver is noted on CT scan, unrelated to cancer. She is aware of the condition and has been advised to avoid certain foods. No new symptoms related to fatty liver reported. - Continue monitoring liver function - Provide dietary guidance as needed  Plan - I personally reviewed her CT scan images and discussed the findings with patient, overall stable disease without the new lesions - Will continue Melissa Snyder 40 mg daily - Lab and follow-up in 2 months   SUMMARY OF ONCOLOGIC HISTORY:  Oncology History Overview Note    Cancer Staging  Breast cancer of upper-outer quadrant of right female breast [facility]) Staging form: Breast, AJCC 7th Edition - Clinical stage from 05/19/2014: Stage IA (T1b, N0, M0) - Signed by Alanna Alley, NP on 11/17/2015 - Pathologic stage from 10/17/2014: Stage IA (T1b, N0, cM0) - Signed by Melissa Bucco, MD on 10/17/2014  Primary cancer of left lower lobe of lung [facility]) Staging form: Lung, AJCC 7th Edition - Clinical stage from 12/14/2014: Stage IA (T1a, N0, M0) - Unsigned    Breast cancer of upper-outer quadrant of right female breast (HCC)  05/04/2014 Initial Diagnosis   Breast cancer   05/19/2014 Imaging   Diagnostic mammogram and ultrasound showed a 7 mm irregular suspicious mass located in the right breast at the 10:00 position, 7 cm from the nipple. Otherwise negative.   05/19/2014 Initial Biopsy   Right breast biopsy (UOQ): IDC, grade 2.    05/19/2014 Receptors her2   ER 100% positive, PR 35% positive, HER-2 negative, Ki67 8%   06/07/2014 Miscellaneous   Genetic testing normal.  APC, ATM, BARD1, BMPR1A, BRCA1, BRCA2, BRIP1, CDH1, CDK4, CDKN2A, CHEK2, EPCAM, GREM1, MLH1, MRE11A, MSH2, MSH6, MUTYH, NBN, NF1, PALB2, PMS2, POLD1, POLE, PTEN, RAD50, RAD51D, SMAD4, SMARCA4, STK11, and TP53 tested.    07/23/2014 Surgery   Right breast lumpectomy with SLNB [facility]) showed invasive ductal carcinoma & DCIS; negative surgical margins.   07/23/2014 Pathology Results   Invasive ductal carcinoma, tumor size 0.8 cm, grade 1, no lymphovascular invasion, margins were negative, 2 sentinel lymph nodes were negative. (+) DCIS.   07/23/2014 Pathologic Stage   pT1b, pN0: Stage IA    09/16/2014 - 10/07/2014 Radiation Therapy   XRT completed Melissa Snyder). Right breast/ 42.72 Gy at 2.67 Gy per fraction x 21 fractions.     Anti-estrogen oral therapy   Aromasin  prescribed in 01/2015 but Patient declines anti-estrogen therapy at this time d/t high co-pay of medication and possible side effects of  AI therapy.    05/02/2015 Mammogram   Diagnostic TOMO bilat mammogram: No evidence of malignancy in either breast. Lumpectomy changes in right breast. Diagnostic mammo suggested in 1 year.    Primary cancer of left lower lobe of lung (HCC)  10/25/2013 Imaging   CXR done for stroke protocol. No acute chest findings. Chronic lung disease with biapical scarring. Developing nodule in (L) apex cannot be excluded based on exam, athough may be d/t overlap of osseous structures.    10/27/2013 Imaging   CXR: Nodular opacity in mid (L) apex and appears more prominent than prior study. Area that appeared masslike in LUL near apex on recent CXR not seen at this time.  Underlying emphysema present. No edema or consolidation   11/12/2013 Imaging   CT chest: At left lung apex, there is asymmetric pleural parenchymal scarring. No discrete lesion.  In superior segment of LLL, there are 1 dominant ground-glass opacity measuring 16 mm. 2 addt'l ground-glass opacities appreciated. F/U with CT in 3 months   02/11/2014 Imaging   CT chest: Several small bilat faint nodular densities, largest measures 1.3 cm over superior segment LLL. No new nodules, no adenopathy. Cannot exclude metastatic disease given pt's h/o melanoma. Recommend PET-CT   02/23/2014 PET scan   Persistent semi-solid nodule in superior segment LLL with low metabolic activity. Moderately metabolic subcarinal & hilar LN are likely reactive.    05/25/2014 Imaging   CT chest: Persistent & similar size dominant LLL sub-solid nodule. Adenocarcinoma is considered. Biopsy or  resection strongly considered. Additional scattered small ground-glass nodules stable.    06/02/2014 Imaging   MRI brain: Grossly stable appearance of left planum sphenoidale meningioma measuring 20 x 14 mm. Meningioma does not extend to left orbital apex & contacts left optic nerve. Stable right posterior clinoid meningioma .    09/27/2014 Imaging   CT chest: Further enlargment of  central solid component in sub-solid LLL pulm nodule. Findings remain concerning for adenoca. Additional scattered small bilat ground-glass nodules and biapical scarring stable. No adenopathy.     11/25/2014 Surgery   Video bronchoscopy with endobronchial ultrasound; Video assisted thoracoscopy; Thoracoscopic LLL superior segmentectomy; Mediastinal lymph node dissection; Cyro intercostal nerve block Melissa [facility])   11/25/2014 Pathology Results   Superior segment lung resection (LLL). Adenocarcinoma, well-diff, spanning 1.5 cm. Surgical margins negative. 5 LNs negative.    11/25/2014 Pathologic Stage   pT1a, pN0: Stage IA    11/25/2014 Initial Diagnosis   Primary cancer of left lower lobe of lung (HCC)   05/31/2015 Imaging   CT chest: No evidence of local recurrent at superior segmentectomy in LLL. New small nodular focus of consolidation surrounding thin parenchymal band. Additional sub-cm ground-glass and solid nodules favored to be benign. No thoracic adenopathy   08/11/2015 Imaging   CT chest: No evidence of residual or recurrent tumor in LLL. Stable small nodular focus in LLL with thin surrounding parenchymal band, favored to be benign.  Multiple small ground-glass nodules unchanged.    06/19/2017 Pathology Results   Diagnosis 1. Lung, biopsy, Left lower lobe - ADENOCARCINOMA. - SEE COMMENT. 2. Lung, biopsy, Left lower lobe - BENIGN LUNG PARENCHYMA. - THERE IS NO EVIDENCE OF MALIGNANCY.   07/09/2017 - 07/22/2017 Radiation Therapy   Radiation treatment dates: 07/09/17-07/22/17 by Dr. Jeryl Snyder    Site/dose:  Left lung/ 50 Gy in 10 fractions   Beams/energy:  IMRT/ 6X   Narrative: The patient tolerated radiation treatment relatively well. She denied pain, fatigue, or difficulty swallowing. Skin to the treatment area appears warm, dry, and normal in color.   06/18/2019 Imaging   CT Chest  IMPRESSION: 1. There are 2 round solid nodules within the left lower lobe which have increased in size  when compared with the previous exam, suspicious. Additionally, along the area of postsurgical change around the oblique fissure of the left lung there is progressive thickening with persistent loculated fluid. Cannot rule out local tumor recurrence along the suture line. 2. No significant change in the appearance of bilateral upper lobe sub solid lung nodules. 3. Aortic Atherosclerosis (ICD10-I70.0). Coronary artery calcifications.     07/07/2019 PET scan   IMPRESSION: 1. No evidence of hypermetabolic local tumor recurrence in the left lung. Low level nonfocal uptake along the segmentectomy site in the left lower lobe favors post treatment change. Continued chest CT surveillance warranted. 2. Two small solid left lower lobe pulmonary nodules, largest 0.7 cm, below PET resolution. Given the growth of these nodules since 10/03/2018 chest CT, metastatic disease remains on the differential. Close chest CT surveillance recommended. 3. Nonspecific hypermetabolism within nonenlarged subcarinal and bilateral hilar lymph nodes, not substantially changed since 03/04/2017 PET-CT study, favoring reactive uptake. 4. No hypermetabolic distant metastatic disease. 5. Chronic findings include: Aortic Atherosclerosis (ICD10-I70.0). Subcentimeter ground-glass pulmonary nodules in the upper lobes are unchanged and are below PET resolution. Moderate sigmoid diverticulosis. Chronic right maxillary sinusitis.   04/13/2020 Imaging   CT Chest  IMPRESSION: 1. Primarily similar appearance of the lungs compared to 10/14/2019. Left sided surgical sutures with similar  solid and subsolid pulmonary nodules as detailed above. A right middle lobe pleural based 5 mm nodule is new since the prior exam, warranting follow-up attention. 2. No thoracic adenopathy. 3. Aortic atherosclerosis (ICD10-I70.0), coronary artery atherosclerosis and emphysema (ICD10-J43.9).   10/10/2020 Imaging   CT chest   IMPRESSION: Postsurgical and post radiation changes in the lungs bilaterally, as above.   12 x 10 mm left lower lobe nodule, mildly progressive. Additional bilobed nodule in the posterior left lower lobe, also favored to be progressive. PET-CT is suggested for further evaluation.   Aortic Atherosclerosis (ICD10-I70.0) and Emphysema (ICD10-J43.9).   11/22/2020 - 12/02/2020 Radiation Therapy   Site Technique Total Dose (Gy) Dose per Fx (Gy) Completed Fx Beam Energies  Lung, Left: Lung_Lt_LLL IMRT 60/60 12 5/5 6XFFF     05/22/2021 Imaging   EXAM: CT CHEST WITHOUT CONTRAST  IMPRESSION: 1. Interval development of a new area of volume loss, ground-glass opacity, and bronchiectasis in the posterior left lower lobe. Imaging features are compatible with radiation therapy. 2. The posterior left lung nodules, including index 12 x 10 mm nodule described previously have become incorporated into the post treatment change on the current study. 3. Interval resolution of the posterior right middle lobe nodularity seen previously. 4. Other scattered areas of consolidative opacity or similar. 5. Trace left pleural effusion. 6. Aortic Atherosclerosis (ICD10-I70.0).      Discussed the use of AI scribe software for clinical note transcription with the patient, who gave verbal consent to proceed.  History of Present Illness The patient, an 89 year old with a history of non-small cell lung cancer, presents for a routine follow-up. She reports no significant change in her breathing and is able to perform routine activities, albeit at a slower pace due to shortness of breath. She has assistance at home for cleaning tasks. She reports occasional coughing, but no more than usual, and no expectoration. She also mentions a persistent need to clear her throat, but denies any associated pain. Her appetite is described as fair, and she has been maintaining her weight with the help of daily milkshakes and two  meals. She has noticed a recent decrease in her taste for milkshakes. She also reports a problem with her voice, feeling like she needs to clear her throat frequently.     All other systems were reviewed with the patient and are negative.  MEDICAL HISTORY:  Past Medical History:  Diagnosis Date   Adenocarcinoma (HCC)    recurrent/notes 07/03/2017   Blind left eye 10/2013   cva   Breast cancer (HCC) 05/2014   ER+/PR+/Her2-     LUNG CANCER   Bundle branch block left    Chest pain    Family history of bladder cancer    Family history of breast cancer    Family history of prostate cancer    GERD (gastroesophageal reflux disease)    History of blood transfusion    age 89   Hyperlipidemia    Hypertension    Hypothyroid    Lung nodule    Melanoma in situ of face (HCC) JUNE 2015   LEFT CHEEK   Meningioma (HCC)    brain lining   Migraine    reports only having one   Osteoarthritis    Personal history of radiation therapy    Radiation 09/16/14-10/07/14   Right  Breast  21 fractions   Skin cancer    Stroke (HCC) 2015   Blind in left eye as a result    SURGICAL HISTORY:  Past Surgical History:  Procedure Laterality Date   ABDOMINAL HYSTERECTOMY  ~ 15   APPENDECTOMY     age 33   BILATERAL SALPINGOOPHORECTOMY     BLADDER REPAIR  2009   cystocele   BRAIN MENINGIOMA EXCISION     Gamma knife to Meningioma in brain lining   BREAST BIOPSY     BREAST LUMPECTOMY Right 07/23/2014   invasive ductal   BUNIONECTOMY     bilateral great toe   CARDIAC CATHETERIZATION  2004   CHOLECYSTECTOMY  ~ 1997   COLONOSCOPY     CRYO INTERCOSTAL NERVE BLOCK Left 11/25/2014   Procedure: CRYO INTERCOSTAL NERVE BLOCK;  Surgeon: Melissa Higashi, MD;  [facility];  Service: Thoracic;  Laterality: Left;   CT RADIATION THERAPY GUIDE     Gamma radiation -lt frontal-[facility]   DILATION AND CURETTAGE OF UTERUS     X 2   HIP ARTHROPLASTY Left 07/04/2017   Procedure: ARTHROPLASTY BIPOLAR HIP  (HEMIARTHROPLASTY);  Surgeon: Sammye Cristal, MD;  Location: [facility] OR;  Service: Orthopedics;  Laterality: Left;   IR THORACENTESIS ASP PLEURAL SPACE W/IMG GUIDE  02/27/2023   MELANOMA EXCISION Left 11/05/23, 11/30/13   CHEEK   SEGMENTECOMY Left 11/25/2014   Procedure: LEFT LOWER LOBE SEGMENTECTOMY;  Surgeon: Melissa Higashi, MD;  Location: [facility];  Service: Thoracic;  Laterality: Left;   VIDEO ASSISTED THORACOSCOPY Left 11/25/2014   Procedure: VIDEO ASSISTED THORACOSCOPY;  Surgeon: Melissa Higashi, MD;  Location: [facility] OR;  Service: Thoracic;  Laterality: Left;   VIDEO BRONCHOSCOPY WITH ENDOBRONCHIAL NAVIGATION N/A 06/19/2017   Procedure: VIDEO BRONCHOSCOPY;  Surgeon: Melissa Higashi, MD;  Location: [facility] OR;  Service: Thoracic;  Laterality: N/A;   VIDEO BRONCHOSCOPY WITH ENDOBRONCHIAL ULTRASOUND N/A 11/25/2014   Procedure: VIDEO BRONCHOSCOPY WITH ENDOBRONCHIAL ULTRASOUND;  Surgeon: Melissa Higashi, MD;  Location: [facility] OR;  Service: Thoracic;  Laterality: N/A;    I have reviewed the social history and family history with the patient and they are unchanged from previous note.  ALLERGIES:  is allergic to beef-derived drug products, other, chocolate, ibuprofen, niacin and related, and tramadol.  MEDICATIONS:  Current Outpatient Medications  Medication Sig Dispense Refill   aspirin  81 MG EC tablet Take 81 mg by mouth at bedtime.     atenolol  (TENORMIN ) 50 MG tablet Take 50 mg by mouth at bedtime.      b complex vitamins capsule Take 1 capsule by mouth daily.     calcium -vitamin D  (OSCAL WITH D) 500-200 MG-UNIT per tablet Take 1 tablet by mouth at bedtime.      Cholecalciferol  (VITAMIN D3) 1000 units CAPS Take 1,000 Units by mouth at bedtime.     co-enzyme Q-10 30 MG capsule Take 30 mg by mouth daily.     denosumab  (PROLIA ) 60 MG/ML SOSY injection Inject 60 mg into the skin every 6 (six) months.     Digestive Enzymes (ENZYME DIGEST PO) Take 5 mLs by mouth 3 (three) times daily with meals.      levothyroxine  (SYNTHROID , LEVOTHROID) 112 MCG tablet Take 112 mcg by mouth daily before breakfast.      lisinopril  (PRINIVIL ,ZESTRIL ) 10 MG tablet Take 1 tablet (10 mg total) by mouth daily. (Patient taking differently: Take 10 mg by mouth 2 (two) times daily.) 30 tablet 1   meclizine (ANTIVERT) 12.5 MG tablet Take 12.5 mg by mouth 3 (three) times daily as needed for dizziness.     meloxicam  (MOBIC ) 7.5 MG tablet Take 1 tablet (7.5 mg  total) by mouth daily. 10 tablet 0   Multiple Vitamin (MULTIVITAMIN) tablet Take 1 tablet by mouth every morning.      nystatin  (MYCOSTATIN ) 100000 UNIT/ML suspension Take 5 mLs by mouth 4 (four) times daily.     Omega-3 1000 MG CAPS Take 1 g by mouth daily.     osimertinib  mesylate (TAGRISSO ) 40 MG tablet Take 1 tablet (40 mg total) by mouth daily. 30 tablet 2   Triamcinolone Acetonide (NASACORT AQ NA) Place 1 spray into the nose at bedtime as needed (CONGESTION).     vitamin E 180 MG (400 UNITS) capsule Take 400 Units by mouth every Monday, Wednesday, and Friday. Mon Wed Fri     No current facility-administered medications for this visit.    PHYSICAL EXAMINATION: ECOG PERFORMANCE STATUS: 2 - Symptomatic, <50% confined to bed  Vitals:   09/25/23 1008 09/25/23 1009  BP: (!) 157/68 (!) 152/62  Pulse: 70   Resp: (!) 23   Temp: 97.8 F (36.6 C)   SpO2: 98%    Wt Readings from Last 3 Encounters:  09/25/23 106 lb 14.4 oz (48.5 kg)  08/26/23 108 lb 3.9 oz (49.1 kg)  07/31/23 108 lb 4.8 oz (49.1 kg)     GENERAL:alert, no distress and comfortable SKIN: skin color, texture, turgor are normal, no rashes or significant lesions EYES: normal, Conjunctiva are pink and non-injected, sclera clear NECK: supple, thyroid  normal size, non-tender, without nodularity LYMPH:  no palpable lymphadenopathy in the cervical, axillary  LUNGS: clear to auscultation and percussion with normal breathing effort HEART: regular rate & rhythm and no murmurs and no lower extremity  edema ABDOMEN:abdomen soft, non-tender and normal bowel sounds Musculoskeletal:no cyanosis of digits and no clubbing  NEURO: alert & oriented x 3 with fluent speech, no focal motor/sensory deficits  Physical Exam   LABORATORY DATA:  I have reviewed the data as listed    Latest Ref Rng & Units 09/18/2023    9:28 AM 08/26/2023   10:44 AM 07/31/2023   11:27 AM  CBC  WBC 4.0 - 10.5 K/uL 4.2  3.7  3.8   Hemoglobin 12.0 - 15.0 g/dL 40.9  81.1  91.4   Hematocrit 36.0 - 46.0 % 37.4  36.7  37.2   Platelets 150 - 400 K/uL 102  88  94         Latest Ref Rng & Units 09/18/2023    9:28 AM 08/26/2023   10:44 AM 07/31/2023   11:27 AM  CMP  Glucose 70 - 99 mg/dL 782  956  213   BUN 8 - 23 mg/dL 26  20  27    Creatinine 0.44 - 1.00 mg/dL 0.86  5.78  4.69   Sodium 135 - 145 mmol/L 139  138  138   Potassium 3.5 - 5.1 mmol/L 4.2  4.0  4.3   Chloride 98 - 111 mmol/L 103  103  102   CO2 22 - 32 mmol/L 32  27  33   Calcium  8.9 - 10.3 mg/dL 9.5  9.1  9.2   Total Protein 6.5 - 8.1 g/dL 7.0   6.7   Total Bilirubin 0.0 - 1.2 mg/dL 0.6   0.6   Alkaline Phos 38 - 126 U/L 74   79   AST 15 - 41 U/L 25   20   ALT 0 - 44 U/L 24   19       RADIOGRAPHIC STUDIES: I have personally reviewed the radiological images as listed and agreed  with the findings in the report. No results found.    No orders of the defined types were placed in this encounter.  All questions were answered. The patient knows to call the clinic with any problems, questions or concerns. No barriers to learning was detected. The total time spent in the appointment was 25 minutes.     Melissa Dauphin, MD 09/25/2023

## 2023-09-30 DIAGNOSIS — S86899A Other injury of other muscle(s) and tendon(s) at lower leg level, unspecified leg, initial encounter: Secondary | ICD-10-CM | POA: Diagnosis not present

## 2023-09-30 DIAGNOSIS — I10 Essential (primary) hypertension: Secondary | ICD-10-CM | POA: Diagnosis not present

## 2023-09-30 DIAGNOSIS — M810 Age-related osteoporosis without current pathological fracture: Secondary | ICD-10-CM | POA: Diagnosis not present

## 2023-09-30 DIAGNOSIS — M25512 Pain in left shoulder: Secondary | ICD-10-CM | POA: Diagnosis not present

## 2023-09-30 DIAGNOSIS — E039 Hypothyroidism, unspecified: Secondary | ICD-10-CM | POA: Diagnosis not present

## 2023-09-30 DIAGNOSIS — C3490 Malignant neoplasm of unspecified part of unspecified bronchus or lung: Secondary | ICD-10-CM | POA: Diagnosis not present

## 2023-09-30 DIAGNOSIS — C50919 Malignant neoplasm of unspecified site of unspecified female breast: Secondary | ICD-10-CM | POA: Diagnosis not present

## 2023-09-30 DIAGNOSIS — I479 Paroxysmal tachycardia, unspecified: Secondary | ICD-10-CM | POA: Diagnosis not present

## 2023-09-30 DIAGNOSIS — M62830 Muscle spasm of back: Secondary | ICD-10-CM | POA: Diagnosis not present

## 2023-09-30 DIAGNOSIS — H9313 Tinnitus, bilateral: Secondary | ICD-10-CM | POA: Diagnosis not present

## 2023-10-01 ENCOUNTER — Other Ambulatory Visit: Payer: Self-pay

## 2023-10-04 ENCOUNTER — Other Ambulatory Visit: Payer: Self-pay

## 2023-10-04 NOTE — Progress Notes (Signed)
 Specialty Pharmacy Refill Coordination Note  Melissa Snyder is a 89 y.o. female contacted today regarding refills of specialty medication(s) Osimertinib  Mesylate (TAGRISSO )   Patient requested Delivery   Delivery date: 10/08/23   Verified address: [address], [place], [place]   Medication will be filled on 10/07/23.

## 2023-10-07 ENCOUNTER — Other Ambulatory Visit: Payer: Self-pay

## 2023-10-17 ENCOUNTER — Other Ambulatory Visit: Payer: Self-pay

## 2023-10-17 ENCOUNTER — Other Ambulatory Visit (HOSPITAL_COMMUNITY): Payer: Self-pay

## 2023-10-22 ENCOUNTER — Other Ambulatory Visit: Payer: Self-pay

## 2023-10-24 ENCOUNTER — Other Ambulatory Visit: Payer: Self-pay | Admitting: Hematology

## 2023-10-24 ENCOUNTER — Other Ambulatory Visit: Payer: Self-pay

## 2023-10-24 ENCOUNTER — Other Ambulatory Visit: Payer: Self-pay | Admitting: Pharmacy Technician

## 2023-10-24 ENCOUNTER — Other Ambulatory Visit (HOSPITAL_COMMUNITY): Payer: Self-pay

## 2023-10-24 DIAGNOSIS — M79605 Pain in left leg: Secondary | ICD-10-CM | POA: Diagnosis not present

## 2023-10-24 DIAGNOSIS — S86899A Other injury of other muscle(s) and tendon(s) at lower leg level, unspecified leg, initial encounter: Secondary | ICD-10-CM | POA: Diagnosis not present

## 2023-10-24 DIAGNOSIS — M79604 Pain in right leg: Secondary | ICD-10-CM | POA: Diagnosis not present

## 2023-10-24 DIAGNOSIS — R269 Unspecified abnormalities of gait and mobility: Secondary | ICD-10-CM | POA: Diagnosis not present

## 2023-10-24 MED ORDER — OSIMERTINIB MESYLATE 40 MG PO TABS
40.0000 mg | ORAL_TABLET | Freq: Every day | ORAL | 2 refills | Status: DC
  Filled 2023-10-24: qty 30, 30d supply, fill #0
  Filled 2023-11-27: qty 30, 30d supply, fill #1
  Filled 2023-12-25: qty 30, 30d supply, fill #2

## 2023-10-24 NOTE — Progress Notes (Signed)
 Specialty Pharmacy Refill Coordination Note  Melissa Snyder is a 89 y.o. female contacted today regarding refills of specialty medication(s) Osimertinib  Mesylate (TAGRISSO )   Patient requested Delivery   Delivery date: 11/07/23   Verified address: [address] [place]   Medication will be filled on 11/06/23.  This fill date is pending response to refill request from provider. Patient is aware and if they have not received fill by intended date they must follow up with pharmacy.

## 2023-11-06 DIAGNOSIS — S86899A Other injury of other muscle(s) and tendon(s) at lower leg level, unspecified leg, initial encounter: Secondary | ICD-10-CM | POA: Diagnosis not present

## 2023-11-06 DIAGNOSIS — R269 Unspecified abnormalities of gait and mobility: Secondary | ICD-10-CM | POA: Diagnosis not present

## 2023-11-06 DIAGNOSIS — M79605 Pain in left leg: Secondary | ICD-10-CM | POA: Diagnosis not present

## 2023-11-06 DIAGNOSIS — M79604 Pain in right leg: Secondary | ICD-10-CM | POA: Diagnosis not present

## 2023-11-13 DIAGNOSIS — M79605 Pain in left leg: Secondary | ICD-10-CM | POA: Diagnosis not present

## 2023-11-13 DIAGNOSIS — M79604 Pain in right leg: Secondary | ICD-10-CM | POA: Diagnosis not present

## 2023-11-13 DIAGNOSIS — R269 Unspecified abnormalities of gait and mobility: Secondary | ICD-10-CM | POA: Diagnosis not present

## 2023-11-13 DIAGNOSIS — S86899A Other injury of other muscle(s) and tendon(s) at lower leg level, unspecified leg, initial encounter: Secondary | ICD-10-CM | POA: Diagnosis not present

## 2023-11-20 DIAGNOSIS — M79605 Pain in left leg: Secondary | ICD-10-CM | POA: Diagnosis not present

## 2023-11-20 DIAGNOSIS — R269 Unspecified abnormalities of gait and mobility: Secondary | ICD-10-CM | POA: Diagnosis not present

## 2023-11-20 DIAGNOSIS — S86899A Other injury of other muscle(s) and tendon(s) at lower leg level, unspecified leg, initial encounter: Secondary | ICD-10-CM | POA: Diagnosis not present

## 2023-11-20 DIAGNOSIS — M79604 Pain in right leg: Secondary | ICD-10-CM | POA: Diagnosis not present

## 2023-11-22 ENCOUNTER — Other Ambulatory Visit: Payer: Self-pay

## 2023-11-22 DIAGNOSIS — Z170 Estrogen receptor positive status [ER+]: Secondary | ICD-10-CM

## 2023-11-22 DIAGNOSIS — C3432 Malignant neoplasm of lower lobe, left bronchus or lung: Secondary | ICD-10-CM

## 2023-11-25 ENCOUNTER — Encounter: Payer: Self-pay | Admitting: Hematology

## 2023-11-25 ENCOUNTER — Inpatient Hospital Stay: Attending: Hematology

## 2023-11-25 ENCOUNTER — Inpatient Hospital Stay: Admitting: Hematology

## 2023-11-25 VITALS — BP 130/60 | HR 76 | Temp 97.3°F | Resp 17 | Ht 65.0 in | Wt 106.7 lb

## 2023-11-25 DIAGNOSIS — D6959 Other secondary thrombocytopenia: Secondary | ICD-10-CM | POA: Insufficient documentation

## 2023-11-25 DIAGNOSIS — Z79899 Other long term (current) drug therapy: Secondary | ICD-10-CM | POA: Insufficient documentation

## 2023-11-25 DIAGNOSIS — Z853 Personal history of malignant neoplasm of breast: Secondary | ICD-10-CM | POA: Diagnosis not present

## 2023-11-25 DIAGNOSIS — C3432 Malignant neoplasm of lower lobe, left bronchus or lung: Secondary | ICD-10-CM | POA: Diagnosis not present

## 2023-11-25 DIAGNOSIS — Z7982 Long term (current) use of aspirin: Secondary | ICD-10-CM | POA: Insufficient documentation

## 2023-11-25 DIAGNOSIS — J90 Pleural effusion, not elsewhere classified: Secondary | ICD-10-CM | POA: Insufficient documentation

## 2023-11-25 DIAGNOSIS — C50411 Malignant neoplasm of upper-outer quadrant of right female breast: Secondary | ICD-10-CM

## 2023-11-25 LAB — CBC WITH DIFFERENTIAL (CANCER CENTER ONLY)
Abs Immature Granulocytes: 0.01 10*3/uL (ref 0.00–0.07)
Basophils Absolute: 0 10*3/uL (ref 0.0–0.1)
Basophils Relative: 1 %
Eosinophils Absolute: 0.1 10*3/uL (ref 0.0–0.5)
Eosinophils Relative: 3 %
HCT: 36.3 % (ref 36.0–46.0)
Hemoglobin: 12.1 g/dL (ref 12.0–15.0)
Immature Granulocytes: 0 %
Lymphocytes Relative: 14 %
Lymphs Abs: 0.4 10*3/uL — ABNORMAL LOW (ref 0.7–4.0)
MCH: 29.8 pg (ref 26.0–34.0)
MCHC: 33.3 g/dL (ref 30.0–36.0)
MCV: 89.4 fL (ref 80.0–100.0)
Monocytes Absolute: 0.3 10*3/uL (ref 0.1–1.0)
Monocytes Relative: 9 %
Neutro Abs: 2.2 10*3/uL (ref 1.7–7.7)
Neutrophils Relative %: 73 %
Platelet Count: 84 10*3/uL — ABNORMAL LOW (ref 150–400)
RBC: 4.06 MIL/uL (ref 3.87–5.11)
RDW: 14 % (ref 11.5–15.5)
WBC Count: 3 10*3/uL — ABNORMAL LOW (ref 4.0–10.5)
nRBC: 0 % (ref 0.0–0.2)

## 2023-11-25 LAB — CMP (CANCER CENTER ONLY)
ALT: 25 U/L (ref 0–44)
AST: 26 U/L (ref 15–41)
Albumin: 4.2 g/dL (ref 3.5–5.0)
Alkaline Phosphatase: 70 U/L (ref 38–126)
Anion gap: 4 — ABNORMAL LOW (ref 5–15)
BUN: 23 mg/dL (ref 8–23)
CO2: 33 mmol/L — ABNORMAL HIGH (ref 22–32)
Calcium: 9.4 mg/dL (ref 8.9–10.3)
Chloride: 103 mmol/L (ref 98–111)
Creatinine: 0.73 mg/dL (ref 0.44–1.00)
GFR, Estimated: >60 mL/min (ref >60)
Glucose, Bld: 104 mg/dL — ABNORMAL HIGH (ref 70–99)
Potassium: 4.1 mmol/L (ref 3.5–5.1)
Sodium: 140 mmol/L (ref 135–145)
Total Bilirubin: 0.6 mg/dL (ref 0.0–1.2)
Total Protein: 6.8 g/dL (ref 6.5–8.1)

## 2023-11-25 NOTE — Assessment & Plan Note (Signed)
 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 Luna Salinas, pathology showing stage I adenocarcinoma -She had cancer recurrence in LLL in 06/2017 and in LUL in 10/2020. She was treated with radiation in 07/2017 and in 11/2020 with Dr. [name].  -She developed cancer recurrence with malignant left pleural effusion in November 2023 -[facility] showed EGFR L858R mutation(+), which indicating good response to EGFR inhibitor, I have started her on osimertinib  in Jan 2024 -she has slightly prolonged QTc, will monitor closely when she is on Osimertinib , repeated EKG showed normal QTc --PET scan 08/02/2022 showed mildly hypermetabolic right upper lung nodule, morphologically benign appearing mediastinal and hilar lymphadenopathy with metabolic activities, no other definitive metastasis.  The left pleura is not hypermetabolic on the PET scan. -She previously tolerated Tagrisso  40 mg daily very well, I increased the dose to 80 mg daily but did not tolerated well, and have reduced dose back to 40 mg daily -PET 05/10/2023 showed no significant cancer progression, with minimal activity in lymph nodes and pleura. Her pleural effusion increased and required thoracentesis in early December 2024, with clinical concern for disease progression. -Due to her advanced age, she is not interested in chemotherapy -restaging CT 09/18/2023 showed persistent small left pleural effusion, no evidence of progression, will continue current therapy

## 2023-11-25 NOTE — Progress Notes (Signed)
 [facility]   Telephone:([phone]) [phone] Fax:(336) [phone]   Clinic Follow up Note   Patient Care Team: Shayne Anes, MD as PCP - General (Internal Medicine) Kerrin Elspeth BROCKS, MD as Consulting Physician (Cardiothoracic Surgery) Gaston Hamilton, MD as Consulting Physician (Urology) Lanny Callander, MD as Consulting Physician (Hematology) Keenan Hastings, MD as Consulting Physician (Radiation Oncology) Lanell Donald Stagger, PA-C as Physician Assistant (Radiation Oncology) Imaging, [facility] as Consulting Physician (Diagnostic Radiology)  Date of Service:  11/25/2023  CHIEF COMPLAINT: f/u of metastatic non-small cell lung cancer  CURRENT THERAPY:  Tagrisso  40 mg daily  Oncology History   Primary cancer of left lower lobe of lung (HCC) Stage I left lung adenocarcinoma, pT1aN0M0 in 2016, LLL recurrence in 06/2017, LUL recurrence in 10/2020, malignant left pleural effusion in 04/2022, EGFR L858R mutation(+) -initially diagnosed in 11/2014, s/p LLL segmentectomy by Dr. Kerrin, pathology showing stage I adenocarcinoma -She had cancer recurrence in LLL in 06/2017 and in LUL in 10/2020. She was treated with radiation in 07/2017 and in 11/2020 with Dr. Dewey.  -She developed cancer recurrence with malignant left pleural effusion in November 2023 -[facility] showed EGFR L858R mutation(+), which indicating good response to EGFR inhibitor, I have started her on osimertinib  in Jan 2024 -she has slightly prolonged QTc, will monitor closely when she is on Osimertinib , repeated EKG showed normal QTc --PET scan 08/02/2022 showed mildly hypermetabolic right upper lung nodule, morphologically benign appearing mediastinal and hilar lymphadenopathy with metabolic activities, no other definitive metastasis.  The left pleura is not hypermetabolic on the PET scan. -She previously tolerated Tagrisso  40 mg daily very well, I increased the dose to 80 mg daily but did not  tolerated well, and have reduced dose back to 40 mg daily -PET 05/10/2023 showed no significant cancer progression, with minimal activity in lymph nodes and pleura. Her pleural effusion increased and required thoracentesis in early December 2024, with clinical concern for disease progression. -Due to her advanced age, she is not interested in chemotherapy -restaging CT 09/18/2023 showed persistent small left pleural effusion, no evidence of progression, will continue current therapy  Assessment & Plan Metastatic lung cancer Metastatic lung cancer with well-managed disease as per the CT scan from April. She is on Tagrisso  and tolerating it well without significant side effects. No evidence of disease progression. Her weight is stable, and she is maintaining a balanced diet. Future management includes continuing Tagrisso  and monitoring disease stability with regular imaging. - Continue Tagrisso . - Schedule CT scan in two months. - If the next scan shows well-managed disease, consider changing to visits every three months and scans every six months.  Pleural effusion due to cancer Pleural effusion secondary to metastatic lung cancer, with fluid present in the left chest. She reports mild changes in breathing, particularly when drinking large volumes of liquid, but no significant dyspnea during activities. The last thoracentesis was performed seven months ago, and she does not feel the same symptoms as when fluid was previously accumulating. - Monitor for increased dyspnea, especially during activities. - Consider chest x-ray if symptoms worsen to assess for fluid increase.  Thrombocytopenia due to Tagrisso  Thrombocytopenia likely secondary to Tagrisso , with low platelet count. She reports easy bruising, particularly on the elbow, which may be exacerbated by the use of baby aspirin . - Discuss with primary care physician about the possibility of discontinuing baby aspirin .  Plan -she is clinically  stable  - Continue Tagrisso   -f/u in 2 months with lab and  CT chest, abdomen pelvis with contrast 1 week prior   SUMMARY OF ONCOLOGIC HISTORY: Oncology History Overview Note   Cancer Staging  Breast cancer of upper-outer quadrant of right female breast [facility]) Staging form: Breast, AJCC 7th Edition - Clinical stage from 05/19/2014: Stage IA (T1b, N0, M0) - Signed by Letha Truman ORN, NP on 11/17/2015 - Pathologic stage from 10/17/2014: Stage IA (T1b, N0, cM0) - Signed by Keenan Hastings, MD on 10/17/2014  Primary cancer of left lower lobe of lung [facility]) Staging form: Lung, AJCC 7th Edition - Clinical stage from 12/14/2014: Stage IA (T1a, N0, M0) - Unsigned    Breast cancer of upper-outer quadrant of right female breast (HCC)  05/04/2014 Initial Diagnosis   Breast cancer   05/19/2014 Imaging   Diagnostic mammogram and ultrasound showed a 7 mm irregular suspicious mass located in the right breast at the 10:00 position, 7 cm from the nipple. Otherwise negative.   05/19/2014 Initial Biopsy   Right breast biopsy (UOQ): IDC, grade 2.    05/19/2014 Receptors her2   ER 100% positive, PR 35% positive, HER-2 negative, Ki67 8%   06/07/2014 Miscellaneous   Genetic testing normal.  APC, ATM, BARD1, BMPR1A, BRCA1, BRCA2, BRIP1, CDH1, CDK4, CDKN2A, CHEK2, EPCAM, GREM1, MLH1, MRE11A, MSH2, MSH6, MUTYH, NBN, NF1, PALB2, PMS2, POLD1, POLE, PTEN, RAD50, RAD51D, SMAD4, SMARCA4, STK11, and TP53 tested.    07/23/2014 Surgery   Right breast lumpectomy with SLNB Jeoffrey) showed invasive ductal carcinoma & DCIS; negative surgical margins.   07/23/2014 Pathology Results   Invasive ductal carcinoma, tumor size 0.8 cm, grade 1, no lymphovascular invasion, margins were negative, 2 sentinel lymph nodes were negative. (+) DCIS.   07/23/2014 Pathologic Stage   pT1b, pN0: Stage IA    09/16/2014 - 10/07/2014 Radiation Therapy   XRT completed Signe). Right breast/ 42.72 Gy at 2.67 Gy per fraction x 21 fractions.      Anti-estrogen oral therapy   Aromasin  prescribed in 01/2015 but Patient declines anti-estrogen therapy at this time d/t high co-pay of medication and possible side effects of AI therapy.    05/02/2015 Mammogram   Diagnostic TOMO bilat mammogram: No evidence of malignancy in either breast. Lumpectomy changes in right breast. Diagnostic mammo suggested in 1 year.    Primary cancer of left lower lobe of lung (HCC)  10/25/2013 Imaging   CXR done for stroke protocol. No acute chest findings. Chronic lung disease with biapical scarring. Developing nodule in (L) apex cannot be excluded based on exam, athough may be d/t overlap of osseous structures.    10/27/2013 Imaging   CXR: Nodular opacity in mid (L) apex and appears more prominent than prior study. Area that appeared masslike in LUL near apex on recent CXR not seen at this time.  Underlying emphysema present. No edema or consolidation   11/12/2013 Imaging   CT chest: At left lung apex, there is asymmetric pleural parenchymal scarring. No discrete lesion.  In superior segment of LLL, there are 1 dominant ground-glass opacity measuring 16 mm. 2 addt'l ground-glass opacities appreciated. F/U with CT in 3 months   02/11/2014 Imaging   CT chest: Several small bilat faint nodular densities, largest measures 1.3 cm over superior segment LLL. No new nodules, no adenopathy. Cannot exclude metastatic disease given pt's h/o melanoma. Recommend PET-CT   02/23/2014 PET scan   Persistent semi-solid nodule in superior segment LLL with low metabolic activity. Moderately metabolic subcarinal & hilar LN are likely reactive.    05/25/2014 Imaging   CT  chest: Persistent & similar size dominant LLL sub-solid nodule. Adenocarcinoma is considered. Biopsy or resection strongly considered. Additional scattered small ground-glass nodules stable.    06/02/2014 Imaging   MRI brain: Grossly stable appearance of left planum sphenoidale meningioma measuring 20 x 14 mm.  Meningioma does not extend to left orbital apex & contacts left optic nerve. Stable right posterior clinoid meningioma .    09/27/2014 Imaging   CT chest: Further enlargment of central solid component in sub-solid LLL pulm nodule. Findings remain concerning for adenoca. Additional scattered small bilat ground-glass nodules and biapical scarring stable. No adenopathy.     11/25/2014 Surgery   Video bronchoscopy with endobronchial ultrasound; Video assisted thoracoscopy; Thoracoscopic LLL superior segmentectomy; Mediastinal lymph node dissection; Cyro intercostal nerve block Arnaldo)   11/25/2014 Pathology Results   Superior segment lung resection (LLL). Adenocarcinoma, well-diff, spanning 1.5 cm. Surgical margins negative. 5 LNs negative.    11/25/2014 Pathologic Stage   pT1a, pN0: Stage IA    11/25/2014 Initial Diagnosis   Primary cancer of left lower lobe of lung (HCC)   05/31/2015 Imaging   CT chest: No evidence of local recurrent at superior segmentectomy in LLL. New small nodular focus of consolidation surrounding thin parenchymal band. Additional sub-cm ground-glass and solid nodules favored to be benign. No thoracic adenopathy   08/11/2015 Imaging   CT chest: No evidence of residual or recurrent tumor in LLL. Stable small nodular focus in LLL with thin surrounding parenchymal band, favored to be benign.  Multiple small ground-glass nodules unchanged.    06/19/2017 Pathology Results   Diagnosis 1. Lung, biopsy, Left lower lobe - ADENOCARCINOMA. - SEE COMMENT. 2. Lung, biopsy, Left lower lobe - BENIGN LUNG PARENCHYMA. - THERE IS NO EVIDENCE OF MALIGNANCY.   07/09/2017 - 07/22/2017 Radiation Therapy   Radiation treatment dates: 07/09/17-07/22/17 by Dr. Dewey    Site/dose:  Left lung/ 50 Gy in 10 fractions   Beams/energy:  IMRT/ 6X   Narrative: The patient tolerated radiation treatment relatively well. She denied pain, fatigue, or difficulty swallowing. Skin to the treatment area  appears warm, dry, and normal in color.   06/18/2019 Imaging   CT Chest  IMPRESSION: 1. There are 2 round solid nodules within the left lower lobe which have increased in size when compared with the previous exam, suspicious. Additionally, along the area of postsurgical change around the oblique fissure of the left lung there is progressive thickening with persistent loculated fluid. Cannot rule out local tumor recurrence along the suture line. 2. No significant change in the appearance of bilateral upper lobe sub solid lung nodules. 3. Aortic Atherosclerosis (ICD10-I70.0). Coronary artery calcifications.     07/07/2019 PET scan   IMPRESSION: 1. No evidence of hypermetabolic local tumor recurrence in the left lung. Low level nonfocal uptake along the segmentectomy site in the left lower lobe favors post treatment change. Continued chest CT surveillance warranted. 2. Two small solid left lower lobe pulmonary nodules, largest 0.7 cm, below PET resolution. Given the growth of these nodules since 10/03/2018 chest CT, metastatic disease remains on the differential. Close chest CT surveillance recommended. 3. Nonspecific hypermetabolism within nonenlarged subcarinal and bilateral hilar lymph nodes, not substantially changed since 03/04/2017 PET-CT study, favoring reactive uptake. 4. No hypermetabolic distant metastatic disease. 5. Chronic findings include: Aortic Atherosclerosis (ICD10-I70.0). Subcentimeter ground-glass pulmonary nodules in the upper lobes are unchanged and are below PET resolution. Moderate sigmoid diverticulosis. Chronic right maxillary sinusitis.   04/13/2020 Imaging   CT Chest  IMPRESSION: 1. Primarily  similar appearance of the lungs compared to 10/14/2019. Left sided surgical sutures with similar solid and subsolid pulmonary nodules as detailed above. A right middle lobe pleural based 5 mm nodule is new since the prior exam, warranting follow-up attention. 2.  No thoracic adenopathy. 3. Aortic atherosclerosis (ICD10-I70.0), coronary artery atherosclerosis and emphysema (ICD10-J43.9).   10/10/2020 Imaging   CT chest  IMPRESSION: Postsurgical and post radiation changes in the lungs bilaterally, as above.   12 x 10 mm left lower lobe nodule, mildly progressive. Additional bilobed nodule in the posterior left lower lobe, also favored to be progressive. PET-CT is suggested for further evaluation.   Aortic Atherosclerosis (ICD10-I70.0) and Emphysema (ICD10-J43.9).   11/22/2020 - 12/02/2020 Radiation Therapy   Site Technique Total Dose (Gy) Dose per Fx (Gy) Completed Fx Beam Energies  Lung, Left: Lung_Lt_LLL IMRT 60/60 12 5/5 6XFFF     05/22/2021 Imaging   EXAM: CT CHEST WITHOUT CONTRAST  IMPRESSION: 1. Interval development of a new area of volume loss, ground-glass opacity, and bronchiectasis in the posterior left lower lobe. Imaging features are compatible with radiation therapy. 2. The posterior left lung nodules, including index 12 x 10 mm nodule described previously have become incorporated into the post treatment change on the current study. 3. Interval resolution of the posterior right middle lobe nodularity seen previously. 4. Other scattered areas of consolidative opacity or similar. 5. Trace left pleural effusion. 6. Aortic Atherosclerosis (ICD10-I70.0).      Discussed the use of AI scribe software for clinical note transcription with the patient, who gave verbal consent to proceed.  History of Present Illness Melissa Snyder is an 89 year old female with metastatic lung cancer who presents for follow-up.  She adheres to her Tagrisso  regimen daily without experiencing nausea or other side effects. Breathing changes occur when drinking isotonic vitamins mixed with water , requiring her to pause to breathe. She does not experience dyspnea while walking, and her current shortness of breath differs from when she had pleural effusion.  Her last thoracentesis was nearly seven months ago. She bruises easily, particularly on her elbow, which she attributes to frequent contact with the freezer handle. She is on baby aspirin , which may contribute to bruising.     All other systems were reviewed with the patient and are negative.  MEDICAL HISTORY:  Past Medical History:  Diagnosis Date   Adenocarcinoma (HCC)    recurrent/notes 07/03/2017   Blind left eye 10/2013   cva   Breast cancer (HCC) 05/2014   ER+/PR+/Her2-     LUNG CANCER   Bundle branch block left    Chest pain    Family history of bladder cancer    Family history of breast cancer    Family history of prostate cancer    GERD (gastroesophageal reflux disease)    History of blood transfusion    age 47   Hyperlipidemia    Hypertension    Hypothyroid    Lung nodule    Melanoma in situ of face (HCC) JUNE 2015   LEFT CHEEK   Meningioma (HCC)    brain lining   Migraine    reports only having one   Osteoarthritis    Personal history of radiation therapy    Radiation 09/16/14-10/07/14   Right  Breast  21 fractions   Skin cancer    Stroke (HCC) 2015   Blind in left eye as a result    SURGICAL HISTORY: Past Surgical History:  Procedure Laterality Date   ABDOMINAL  HYSTERECTOMY  ~ 18   APPENDECTOMY     age 41   BILATERAL SALPINGOOPHORECTOMY     BLADDER REPAIR  2009   cystocele   BRAIN MENINGIOMA EXCISION     Gamma knife to Meningioma in brain lining   BREAST BIOPSY     BREAST LUMPECTOMY Right 07/23/2014   invasive ductal   BUNIONECTOMY     bilateral great toe   CARDIAC CATHETERIZATION  2004   CHOLECYSTECTOMY  ~ 1997   COLONOSCOPY     CRYO INTERCOSTAL NERVE BLOCK Left 11/25/2014   Procedure: CRYO INTERCOSTAL NERVE BLOCK;  Surgeon: Elspeth JAYSON Millers, MD;  Location: [facility] OR;  Service: Thoracic;  Laterality: Left;   CT RADIATION THERAPY GUIDE     Gamma radiation -lt frontal-[facility]   DILATION AND CURETTAGE OF UTERUS     X 2   HIP ARTHROPLASTY Left  07/04/2017   Procedure: ARTHROPLASTY BIPOLAR HIP (HEMIARTHROPLASTY);  Surgeon: Dozier Soulier, MD;  Location: [facility] OR;  Service: Orthopedics;  Laterality: Left;   IR THORACENTESIS ASP PLEURAL SPACE W/IMG GUIDE  02/27/2023   MELANOMA EXCISION Left 11/05/23, 11/30/13   CHEEK   SEGMENTECOMY Left 11/25/2014   Procedure: LEFT LOWER LOBE SEGMENTECTOMY;  Surgeon: Elspeth JAYSON Millers, MD;  [facility] OR;  Service: Thoracic;  Laterality: Left;   VIDEO ASSISTED THORACOSCOPY Left 11/25/2014   Procedure: VIDEO ASSISTED THORACOSCOPY;  Surgeon: Elspeth JAYSON Millers, MD;  Location: [facility] OR;  Service: Thoracic;  Laterality: Left;   VIDEO BRONCHOSCOPY WITH ENDOBRONCHIAL NAVIGATION N/A 06/19/2017   Procedure: VIDEO BRONCHOSCOPY;  Surgeon: Millers Elspeth JAYSON, MD;  Location: [facility] OR;  Service: Thoracic;  Laterality: N/A;   VIDEO BRONCHOSCOPY WITH ENDOBRONCHIAL ULTRASOUND N/A 11/25/2014   Procedure: VIDEO BRONCHOSCOPY WITH ENDOBRONCHIAL ULTRASOUND;  Surgeon: Elspeth JAYSON Millers, MD;  Location: [facility] OR;  Service: Thoracic;  Laterality: N/A;    I have reviewed the social history and family history with the patient and they are unchanged from previous note.  ALLERGIES:  is allergic to beef-derived drug products, other, chocolate, ibuprofen, niacin and related, and tramadol.  MEDICATIONS:  Current Outpatient Medications  Medication Sig Dispense Refill   aspirin  81 MG EC tablet Take 81 mg by mouth at bedtime.     atenolol  (TENORMIN ) 50 MG tablet Take 50 mg by mouth at bedtime.      b complex vitamins capsule Take 1 capsule by mouth daily.     calcium -vitamin D  (OSCAL WITH D) 500-200 MG-UNIT per tablet Take 1 tablet by mouth at bedtime.      Cholecalciferol  (VITAMIN D3) 1000 units CAPS Take 1,000 Units by mouth at bedtime.     co-enzyme Q-10 30 MG capsule Take 30 mg by mouth daily.     denosumab  (PROLIA ) 60 MG/ML SOSY injection Inject 60 mg into the skin every 6 (six) months.     Digestive Enzymes (ENZYME DIGEST PO) Take  5 mLs by mouth 3 (three) times daily with meals.     levothyroxine  (SYNTHROID , LEVOTHROID) 112 MCG tablet Take 112 mcg by mouth daily before breakfast.      lisinopril  (PRINIVIL ,ZESTRIL ) 10 MG tablet Take 1 tablet (10 mg total) by mouth daily. (Patient taking differently: Take 10 mg by mouth 2 (two) times daily.) 30 tablet 1   meloxicam  (MOBIC ) 7.5 MG tablet Take 1 tablet (7.5 mg total) by mouth daily. 10 tablet 0   Multiple Vitamin (MULTIVITAMIN) tablet Take 1 tablet by mouth every morning.      nystatin  (MYCOSTATIN ) 100000 UNIT/ML suspension Take 5  mLs by mouth 4 (four) times daily.     Omega-3 1000 MG CAPS Take 1 g by mouth daily.     osimertinib  mesylate (TAGRISSO ) 40 MG tablet Take 1 tablet (40 mg total) by mouth daily. 30 tablet 2   vitamin E 180 MG (400 UNITS) capsule Take 400 Units by mouth every Monday, Wednesday, and Friday. Mon Wed Fri     meclizine (ANTIVERT) 12.5 MG tablet Take 12.5 mg by mouth 3 (three) times daily as needed for dizziness. (Patient not taking: Reported on 11/25/2023)     Triamcinolone Acetonide (NASACORT AQ NA) Place 1 spray into the nose at bedtime as needed (CONGESTION). (Patient not taking: Reported on 11/25/2023)     No current facility-administered medications for this visit.    PHYSICAL EXAMINATION: ECOG PERFORMANCE STATUS: 2 - Symptomatic, <50% confined to bed  Vitals:   11/25/23 1059  BP: 130/60  Pulse: 76  Resp: 17  Temp: (!) 97.3 F (36.3 C)  SpO2: 97%   Wt Readings from Last 3 Encounters:  11/25/23 106 lb 11.2 oz (48.4 kg)  09/25/23 106 lb 14.4 oz (48.5 kg)  08/26/23 108 lb 3.9 oz (49.1 kg)     GENERAL:alert, no distress and comfortable SKIN: skin color, texture, turgor are normal, no rashes or significant lesions EYES: normal, Conjunctiva are pink and non-injected, sclera clear NECK: supple, thyroid  normal size, non-tender, without nodularity LYMPH:  no palpable lymphadenopathy in the cervical, axillary  LUNGS: clear to auscultation and  percussion with normal breathing effort, slightly decreased breath sound in the left lung base HEART: regular rate & rhythm and no murmurs and no lower extremity edema ABDOMEN:abdomen soft, non-tender and normal bowel sounds Musculoskeletal:no cyanosis of digits and no clubbing  NEURO: alert & oriented x 3 with fluent speech, no focal motor/sensory deficits  Physical Exam CHEST: Fluid present in left chest.  LABORATORY DATA:  I have reviewed the data as listed    Latest Ref Rng & Units 11/25/2023   10:11 AM 09/18/2023    9:28 AM 08/26/2023   10:44 AM  CBC  WBC 4.0 - 10.5 K/uL 3.0  4.2  3.7   Hemoglobin 12.0 - 15.0 g/dL 87.8  87.6  87.9   Hematocrit 36.0 - 46.0 % 36.3  37.4  36.7   Platelets 150 - 400 K/uL 84  102  88         Latest Ref Rng & Units 11/25/2023   10:11 AM 09/18/2023    9:28 AM 08/26/2023   10:44 AM  CMP  Glucose 70 - 99 mg/dL 895  880  848   BUN 8 - 23 mg/dL 23  26  20    Creatinine 0.44 - 1.00 mg/dL 9.26  9.23  9.21   Sodium 135 - 145 mmol/L 140  139  138   Potassium 3.5 - 5.1 mmol/L 4.1  4.2  4.0   Chloride 98 - 111 mmol/L 103  103  103   CO2 22 - 32 mmol/L 33  32  27   Calcium  8.9 - 10.3 mg/dL 9.4  9.5  9.1   Total Protein 6.5 - 8.1 g/dL 6.8  7.0    Total Bilirubin 0.0 - 1.2 mg/dL 0.6  0.6    Alkaline Phos 38 - 126 U/L 70  74    AST 15 - 41 U/L 26  25    ALT 0 - 44 U/L 25  24        RADIOGRAPHIC STUDIES: I have personally reviewed  the radiological images as listed and agreed with the findings in the report. No results found.    Orders Placed This Encounter  Procedures   CT CHEST ABDOMEN PELVIS W CONTRAST    Standing Status:   Future    Expected Date:   01/17/2024    Expiration Date:   11/24/2024    If indicated for the ordered procedure, I authorize the administration of contrast media per Radiology protocol:   Yes    Does the patient have a contrast media/X-ray dye allergy?:   No    Preferred imaging location?:   [facility]    If  indicated for the ordered procedure, I authorize the administration of oral contrast media per Radiology protocol:   Yes   All questions were answered. The patient knows to call the clinic with any problems, questions or concerns. No barriers to learning was detected. The total time spent in the appointment was 25 minutes, including review of chart and various tests results, discussions about plan of care and coordination of care plan     Onita Mattock, MD 11/25/2023

## 2023-11-26 ENCOUNTER — Other Ambulatory Visit: Payer: Self-pay

## 2023-11-27 ENCOUNTER — Other Ambulatory Visit: Payer: Self-pay

## 2023-11-27 DIAGNOSIS — R269 Unspecified abnormalities of gait and mobility: Secondary | ICD-10-CM | POA: Diagnosis not present

## 2023-11-27 DIAGNOSIS — M79604 Pain in right leg: Secondary | ICD-10-CM | POA: Diagnosis not present

## 2023-11-27 DIAGNOSIS — M79605 Pain in left leg: Secondary | ICD-10-CM | POA: Diagnosis not present

## 2023-11-27 DIAGNOSIS — S86899A Other injury of other muscle(s) and tendon(s) at lower leg level, unspecified leg, initial encounter: Secondary | ICD-10-CM | POA: Diagnosis not present

## 2023-11-27 NOTE — Progress Notes (Signed)
 Specialty Pharmacy Ongoing Clinical Assessment Note  Melissa Snyder is a 89 y.o. female who is being followed by the specialty pharmacy service for RxSp Oncology   Patient's specialty medication(s) reviewed today: Osimertinib  Mesylate (TAGRISSO )   Missed doses in the last 4 weeks: 0   Patient/Caregiver did not have any additional questions or concerns.   Therapeutic benefit summary: Patient is achieving benefit   Adverse events/side effects summary: No adverse events/side effects   Patient's therapy is appropriate to: Continue    Goals Addressed             This Visit's Progress    Stabilization of disease   On track    Patient is on track. Patient will maintain adherence. Per visit on 11/25/23, patient has no evidence of disease progression.         Follow up: 6 months  [facility]

## 2023-11-27 NOTE — Progress Notes (Signed)
 Specialty Pharmacy Refill Coordination Note  Melissa Snyder is a 89 y.o. female contacted today regarding refills of specialty medication(s) Osimertinib  Mesylate (TAGRISSO )   Patient requested Delivery   Delivery date: 12/03/23   Verified address: [address] [place]   Medication will be filled on 12/02/23.

## 2023-11-28 DIAGNOSIS — H04123 Dry eye syndrome of bilateral lacrimal glands: Secondary | ICD-10-CM | POA: Diagnosis not present

## 2023-11-28 DIAGNOSIS — H524 Presbyopia: Secondary | ICD-10-CM | POA: Diagnosis not present

## 2023-11-28 DIAGNOSIS — H2512 Age-related nuclear cataract, left eye: Secondary | ICD-10-CM | POA: Diagnosis not present

## 2023-12-02 DIAGNOSIS — L821 Other seborrheic keratosis: Secondary | ICD-10-CM | POA: Diagnosis not present

## 2023-12-02 DIAGNOSIS — L814 Other melanin hyperpigmentation: Secondary | ICD-10-CM | POA: Diagnosis not present

## 2023-12-02 DIAGNOSIS — D225 Melanocytic nevi of trunk: Secondary | ICD-10-CM | POA: Diagnosis not present

## 2023-12-02 DIAGNOSIS — L570 Actinic keratosis: Secondary | ICD-10-CM | POA: Diagnosis not present

## 2023-12-05 DIAGNOSIS — R269 Unspecified abnormalities of gait and mobility: Secondary | ICD-10-CM | POA: Diagnosis not present

## 2023-12-05 DIAGNOSIS — M79604 Pain in right leg: Secondary | ICD-10-CM | POA: Diagnosis not present

## 2023-12-05 DIAGNOSIS — S86899A Other injury of other muscle(s) and tendon(s) at lower leg level, unspecified leg, initial encounter: Secondary | ICD-10-CM | POA: Diagnosis not present

## 2023-12-05 DIAGNOSIS — M79605 Pain in left leg: Secondary | ICD-10-CM | POA: Diagnosis not present

## 2023-12-16 ENCOUNTER — Other Ambulatory Visit: Payer: Self-pay

## 2023-12-17 ENCOUNTER — Other Ambulatory Visit: Payer: Self-pay

## 2023-12-17 ENCOUNTER — Telehealth: Payer: Self-pay

## 2023-12-17 NOTE — Progress Notes (Signed)
 Clinical Intervention Note  Clinical Intervention Notes: Patient asked about starting Vision Essentials Ultra, an eye supplement. Although not specifically in Micromedex, the main ingredient is Lutein. No DDIs identified with Tagrisso  and Lutein. LVM for patient to let her know.   Clinical Intervention Outcomes: Prevention of an adverse drug event   Advertising account planner

## 2023-12-17 NOTE — Telephone Encounter (Signed)
 Patient called in stating she is having very dry eyes after starting her Targisso. She went to her othomologist for this and he instructed her to do eye drops 4 times a day for the dryness. She stated it works but now she is having pain behind her eyes for the past 4 days.

## 2023-12-20 DIAGNOSIS — M79605 Pain in left leg: Secondary | ICD-10-CM | POA: Diagnosis not present

## 2023-12-20 DIAGNOSIS — S86899A Other injury of other muscle(s) and tendon(s) at lower leg level, unspecified leg, initial encounter: Secondary | ICD-10-CM | POA: Diagnosis not present

## 2023-12-20 DIAGNOSIS — M79604 Pain in right leg: Secondary | ICD-10-CM | POA: Diagnosis not present

## 2023-12-20 DIAGNOSIS — R269 Unspecified abnormalities of gait and mobility: Secondary | ICD-10-CM | POA: Diagnosis not present

## 2023-12-25 ENCOUNTER — Other Ambulatory Visit (HOSPITAL_COMMUNITY): Payer: Self-pay | Admitting: *Deleted

## 2023-12-25 ENCOUNTER — Other Ambulatory Visit: Payer: Self-pay

## 2023-12-25 ENCOUNTER — Telehealth (HOSPITAL_COMMUNITY): Payer: Self-pay | Admitting: Pharmacy Technician

## 2023-12-25 DIAGNOSIS — R269 Unspecified abnormalities of gait and mobility: Secondary | ICD-10-CM | POA: Diagnosis not present

## 2023-12-25 DIAGNOSIS — M79605 Pain in left leg: Secondary | ICD-10-CM | POA: Diagnosis not present

## 2023-12-25 DIAGNOSIS — M79604 Pain in right leg: Secondary | ICD-10-CM | POA: Diagnosis not present

## 2023-12-25 DIAGNOSIS — S86899A Other injury of other muscle(s) and tendon(s) at lower leg level, unspecified leg, initial encounter: Secondary | ICD-10-CM | POA: Diagnosis not present

## 2023-12-25 NOTE — Telephone Encounter (Signed)
 Auth Submission: NO AUTH NEEDED Site of care: [facility] Payer: [employer] Medication & CPT/J Code(s) submitted: Prolia  (Denosumab ) [id number] Diagnosis Code: M81.0 Route of submission (phone, fax, portal):  Phone # Fax # Auth type: Buy/Bill HB Units/visits requested: 60mg  x 2 doses, q 6 months Reference number: [id number] Auth# [id number] Approval from: 06/19/23 to 06/18/24    Dagoberto Armour, CPhT [facility] Phone: [phone] 12/25/2023

## 2023-12-25 NOTE — Progress Notes (Signed)
 Specialty Pharmacy Refill Coordination Note  Melissa Snyder is a 89 y.o. female contacted today regarding refills of specialty medication(s) Osimertinib  Mesylate (TAGRISSO )   Patient requested Delivery   Delivery date: 01/09/24   Verified address: [id number]   [place] [id number]   Medication will be filled on 01/08/24.

## 2023-12-26 DIAGNOSIS — E039 Hypothyroidism, unspecified: Secondary | ICD-10-CM | POA: Diagnosis not present

## 2023-12-27 ENCOUNTER — Ambulatory Visit (HOSPITAL_COMMUNITY)
Admission: RE | Admit: 2023-12-27 | Discharge: 2023-12-27 | Disposition: A | Discharge status: Home / Self Care | Source: Ambulatory Visit | Attending: Internal Medicine | Admitting: Internal Medicine

## 2023-12-27 DIAGNOSIS — M810 Age-related osteoporosis without current pathological fracture: Secondary | ICD-10-CM | POA: Diagnosis not present

## 2023-12-27 MED ORDER — DENOSUMAB 60 MG/ML ~~LOC~~ SOSY
PREFILLED_SYRINGE | SUBCUTANEOUS | Status: AC
Start: 2023-12-27 — End: 2023-12-27
  Filled 2023-12-27: qty 1

## 2023-12-27 MED ORDER — DENOSUMAB 60 MG/ML ~~LOC~~ SOSY
60.0000 mg | PREFILLED_SYRINGE | Freq: Once | SUBCUTANEOUS | Status: AC
  Administered 2023-12-27: 60 mg via SUBCUTANEOUS

## 2023-12-30 ENCOUNTER — Other Ambulatory Visit: Payer: Self-pay

## 2024-01-02 ENCOUNTER — Telehealth: Payer: Self-pay | Admitting: Hematology

## 2024-01-02 ENCOUNTER — Other Ambulatory Visit: Payer: Self-pay

## 2024-01-02 NOTE — Telephone Encounter (Signed)
 Scheduled appointment per 7/31 secure chat. Called and left a VM with appointment details for the patient.

## 2024-01-13 ENCOUNTER — Ambulatory Visit (HOSPITAL_COMMUNITY)
Admission: RE | Admit: 2024-01-13 | Discharge: 2024-01-13 | Disposition: A | Discharge status: Home / Self Care | Source: Ambulatory Visit | Attending: Hematology | Admitting: Hematology

## 2024-01-13 ENCOUNTER — Inpatient Hospital Stay: Attending: Hematology

## 2024-01-13 DIAGNOSIS — Z79634 Long term (current) use of topoisomerase inhibitor: Secondary | ICD-10-CM | POA: Insufficient documentation

## 2024-01-13 DIAGNOSIS — C3432 Malignant neoplasm of lower lobe, left bronchus or lung: Secondary | ICD-10-CM | POA: Insufficient documentation

## 2024-01-13 DIAGNOSIS — J910 Malignant pleural effusion: Secondary | ICD-10-CM | POA: Insufficient documentation

## 2024-01-13 DIAGNOSIS — I700 Atherosclerosis of aorta: Secondary | ICD-10-CM | POA: Diagnosis not present

## 2024-01-13 DIAGNOSIS — J90 Pleural effusion, not elsewhere classified: Secondary | ICD-10-CM | POA: Diagnosis not present

## 2024-01-13 DIAGNOSIS — C3490 Malignant neoplasm of unspecified part of unspecified bronchus or lung: Secondary | ICD-10-CM | POA: Diagnosis not present

## 2024-01-13 MED ORDER — IOHEXOL 9 MG/ML PO SOLN
500.0000 mL | ORAL | Status: AC
  Administered 2024-01-13 (×4): 500 mL via ORAL

## 2024-01-13 MED ORDER — IOHEXOL 300 MG/ML  SOLN
100.0000 mL | Freq: Once | INTRAMUSCULAR | Status: AC | PRN
  Administered 2024-01-13 (×2): 100 mL via INTRAVENOUS

## 2024-01-27 ENCOUNTER — Inpatient Hospital Stay

## 2024-01-27 ENCOUNTER — Other Ambulatory Visit: Payer: Self-pay

## 2024-01-27 ENCOUNTER — Inpatient Hospital Stay: Admitting: Hematology

## 2024-01-27 VITALS — BP 152/70 | HR 80 | Temp 97.8°F | Resp 18 | Ht 65.0 in | Wt 104.4 lb

## 2024-01-27 DIAGNOSIS — C3432 Malignant neoplasm of lower lobe, left bronchus or lung: Secondary | ICD-10-CM | POA: Diagnosis not present

## 2024-01-27 DIAGNOSIS — J910 Malignant pleural effusion: Secondary | ICD-10-CM | POA: Diagnosis not present

## 2024-01-27 DIAGNOSIS — Z170 Estrogen receptor positive status [ER+]: Secondary | ICD-10-CM

## 2024-01-27 DIAGNOSIS — Z79634 Long term (current) use of topoisomerase inhibitor: Secondary | ICD-10-CM | POA: Diagnosis not present

## 2024-01-27 LAB — CBC WITH DIFFERENTIAL (CANCER CENTER ONLY)
Abs Immature Granulocytes: 0 K/uL (ref 0.00–0.07)
Basophils Absolute: 0 K/uL (ref 0.0–0.1)
Basophils Relative: 1 %
Eosinophils Absolute: 0.1 K/uL (ref 0.0–0.5)
Eosinophils Relative: 3 %
HCT: 39.3 % (ref 36.0–46.0)
Hemoglobin: 13.1 g/dL (ref 12.0–15.0)
Immature Granulocytes: 0 %
Lymphocytes Relative: 15 %
Lymphs Abs: 0.5 K/uL — ABNORMAL LOW (ref 0.7–4.0)
MCH: 29.8 pg (ref 26.0–34.0)
MCHC: 33.3 g/dL (ref 30.0–36.0)
MCV: 89.5 fL (ref 80.0–100.0)
Monocytes Absolute: 0.3 K/uL (ref 0.1–1.0)
Monocytes Relative: 9 %
Neutro Abs: 2.4 K/uL (ref 1.7–7.7)
Neutrophils Relative %: 72 %
Platelet Count: 90 K/uL — ABNORMAL LOW (ref 150–400)
RBC: 4.39 MIL/uL (ref 3.87–5.11)
RDW: 14 % (ref 11.5–15.5)
WBC Count: 3.3 K/uL — ABNORMAL LOW (ref 4.0–10.5)
nRBC: 0 % (ref 0.0–0.2)

## 2024-01-27 LAB — CMP (CANCER CENTER ONLY)
ALT: 23 U/L (ref 0–44)
AST: 24 U/L (ref 15–41)
Albumin: 4.4 g/dL (ref 3.5–5.0)
Alkaline Phosphatase: 86 U/L (ref 38–126)
Anion gap: 4 — ABNORMAL LOW (ref 5–15)
BUN: 24 mg/dL — ABNORMAL HIGH (ref 8–23)
CO2: 33 mmol/L — ABNORMAL HIGH (ref 22–32)
Calcium: 9.2 mg/dL (ref 8.9–10.3)
Chloride: 102 mmol/L (ref 98–111)
Creatinine: 0.68 mg/dL (ref 0.44–1.00)
GFR, Estimated: >60 mL/min (ref >60)
Glucose, Bld: 135 mg/dL — ABNORMAL HIGH (ref 70–99)
Potassium: 4.2 mmol/L (ref 3.5–5.1)
Sodium: 139 mmol/L (ref 135–145)
Total Bilirubin: 0.6 mg/dL (ref 0.0–1.2)
Total Protein: 6.9 g/dL (ref 6.5–8.1)

## 2024-01-27 NOTE — Assessment & Plan Note (Addendum)
 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 Kerrin, pathology showing stage I adenocarcinoma -She had cancer recurrence in LLL in 06/2017 and in LUL in 10/2020. She was treated with radiation in 07/2017 and in 11/2020 with Dr. Dewey.  -She developed cancer recurrence with malignant left pleural effusion in November 2023 -[facility] showed EGFR L858R mutation(+), which indicating good response to EGFR inhibitor, I have started her on osimertinib  in Jan 2024 -she has slightly prolonged QTc, will monitor closely when she is on Osimertinib , repeated EKG showed normal QTc --PET scan 08/02/2022 showed mildly hypermetabolic right upper lung nodule, morphologically benign appearing mediastinal and hilar lymphadenopathy with metabolic activities, no other definitive metastasis.  The left pleura is not hypermetabolic on the PET scan. -She previously tolerated Tagrisso  40 mg daily very well, I increased the dose to 80 mg daily but did not tolerated well, and have reduced dose back to 40 mg daily -PET 05/10/2023 showed no significant cancer progression, with minimal activity in lymph nodes and pleura. Her pleural effusion increased and required thoracentesis in early December 2024, with clinical concern for disease progression. -Due to her advanced age, she is not interested in chemotherapy -restaging CT 09/18/2023 and 01/13/2024 showed persistent small left pleural effusion, no evidence of progression, will continue current therapy

## 2024-01-28 NOTE — Progress Notes (Signed)
 [facility]   [phone] Fax:([phone]   Clinic Follow up Note   Patient Care Team: Shayne Anes, MD as PCP - General (Internal Medicine) Kerrin Elspeth BROCKS, MD as Consulting Physician (Cardiothoracic Surgery) Gaston Hamilton, MD as Consulting Physician (Urology) Lanny Callander, MD as Consulting Physician (Hematology) Keenan Hastings, MD as Consulting Physician (Radiation Oncology) Lanell Donald Stagger, PA-C as Physician Assistant (Radiation Oncology) Imaging, [facility] as Consulting Physician (Diagnostic Radiology)  Date of Service:  01/27/2024  CHIEF COMPLAINT: f/u of metastatic lung adenocarcinoma, eGFR mutation positive  CURRENT THERAPY:  Tagrisso   Oncology History   Primary cancer of left lower lobe of lung (HCC) Stage I left lung adenocarcinoma, pT1aN0M0 in 2016, LLL recurrence in 06/2017, LUL recurrence in 10/2020, malignant left pleural effusion in 04/2022, EGFR L858R mutation(+) -initially diagnosed in 11/2014, s/p LLL segmentectomy by Dr. Kerrin, pathology showing stage I adenocarcinoma -She had cancer recurrence in LLL in 06/2017 and in LUL in 10/2020. She was treated with radiation in 07/2017 and in 11/2020 with Dr. Dewey.  -She developed cancer recurrence with malignant left pleural effusion in November 2023 -[facility] showed EGFR L858R mutation(+), which indicating good response to EGFR inhibitor, I have started her on osimertinib  in Jan 2024 -she has slightly prolonged QTc, will monitor closely when she is on Osimertinib , repeated EKG showed normal QTc --PET scan 08/02/2022 showed mildly hypermetabolic right upper lung nodule, morphologically benign appearing mediastinal and hilar lymphadenopathy with metabolic activities, no other definitive metastasis.  The left pleura is not hypermetabolic on the PET scan. -She previously tolerated Tagrisso  40 mg daily very well, I increased the dose to 80 mg daily but did  not tolerated well, and have reduced dose back to 40 mg daily -PET 05/10/2023 showed no significant cancer progression, with minimal activity in lymph nodes and pleura. Her pleural effusion increased and required thoracentesis in early December 2024, with clinical concern for disease progression. -Due to her advanced age, she is not interested in chemotherapy -restaging CT 09/18/2023 and 01/13/2024 showed persistent small left pleural effusion, no evidence of progression, will continue current therapy  Assessment & Plan Metastatic non-small cell lung cancer with malignant left pleural effusion Metastatic non-small cell lung cancer is well-managed with Tagrisso , showing stable disease on CT scan with a small to moderate left pleural effusion. No significant change in the lung mass or pleural effusion since April. Tagrisso  has been effective since January 2024, with an anticipated duration of effectiveness of 2-3 years, potentially extending to 3-4 years. Chemotherapy is not viable due to her age. - Continue Tagrisso  therapy - Monitor for changes in breathing and contact the clinic if any changes occur  Leukopenia and thrombocytopenia secondary to Tagrisso  Leukopenia and thrombocytopenia are present but stable, known side effects of Tagrisso , which she has been taking for over a year and a half. - Continue monitoring blood counts regularly  Dizziness, likely medication-related Dizziness is possibly related to dehydration or medication side effects. She experienced dizziness and drowsiness after taking meclizine. Blood counts are normal, and there is no anemia. Adequate hydration is advised as dehydration can contribute to dizziness. - Advise increased hydration  Plan - Restaging CT scan images reviewed with patient, overall stable disease. - Continue Tagrisso , she is tolerating well - Lab and follow-up in 3 months, plan to repeat CT in 6 months if clinically stable.   SUMMARY OF ONCOLOGIC  HISTORY: Oncology History Overview Note   Cancer Staging  Breast cancer of upper-outer  quadrant of right female breast [facility]) Staging form: Breast, AJCC 7th Edition - Clinical stage from 05/19/2014: Stage IA (T1b, N0, M0) - Signed by Letha Truman ORN, NP on 11/17/2015 - Pathologic stage from 10/17/2014: Stage IA (T1b, N0, cM0) - Signed by Keenan Hastings, MD on 10/17/2014  Primary cancer of left lower lobe of lung [facility]) Staging form: Lung, AJCC 7th Edition - Clinical stage from 12/14/2014: Stage IA (T1a, N0, M0) - Unsigned    Breast cancer of upper-outer quadrant of right female breast (HCC)  05/04/2014 Initial Diagnosis   Breast cancer   05/19/2014 Imaging   Diagnostic mammogram and ultrasound showed a 7 mm irregular suspicious mass located in the right breast at the 10:00 position, 7 cm from the nipple. Otherwise negative.   05/19/2014 Initial Biopsy   Right breast biopsy (UOQ): IDC, grade 2.    05/19/2014 Receptors her2   ER 100% positive, PR 35% positive, HER-2 negative, Ki67 8%   06/07/2014 Miscellaneous   Genetic testing normal.  APC, ATM, BARD1, BMPR1A, BRCA1, BRCA2, BRIP1, CDH1, CDK4, CDKN2A, CHEK2, EPCAM, GREM1, MLH1, MRE11A, MSH2, MSH6, MUTYH, NBN, NF1, PALB2, PMS2, POLD1, POLE, PTEN, RAD50, RAD51D, SMAD4, SMARCA4, STK11, and TP53 tested.    07/23/2014 Surgery   Right breast lumpectomy with SLNB Jeoffrey) showed invasive ductal carcinoma & DCIS; negative surgical margins.   07/23/2014 Pathology Results   Invasive ductal carcinoma, tumor size 0.8 cm, grade 1, no lymphovascular invasion, margins were negative, 2 sentinel lymph nodes were negative. (+) DCIS.   07/23/2014 Pathologic Stage   pT1b, pN0: Stage IA    09/16/2014 - 10/07/2014 Radiation Therapy   XRT completed Signe). Right breast/ 42.72 Gy at 2.67 Gy per fraction x 21 fractions.     Anti-estrogen oral therapy   Aromasin  prescribed in 01/2015 but Patient declines anti-estrogen therapy at this time d/t high co-pay of  medication and possible side effects of AI therapy.    05/02/2015 Mammogram   Diagnostic TOMO bilat mammogram: No evidence of malignancy in either breast. Lumpectomy changes in right breast. Diagnostic mammo suggested in 1 year.    Primary cancer of left lower lobe of lung (HCC)  10/25/2013 Imaging   CXR done for stroke protocol. No acute chest findings. Chronic lung disease with biapical scarring. Developing nodule in (L) apex cannot be excluded based on exam, athough may be d/t overlap of osseous structures.    10/27/2013 Imaging   CXR: Nodular opacity in mid (L) apex and appears more prominent than prior study. Area that appeared masslike in LUL near apex on recent CXR not seen at this time.  Underlying emphysema present. No edema or consolidation   11/12/2013 Imaging   CT chest: At left lung apex, there is asymmetric pleural parenchymal scarring. No discrete lesion.  In superior segment of LLL, there are 1 dominant ground-glass opacity measuring 16 mm. 2 addt'l ground-glass opacities appreciated. F/U with CT in 3 months   02/11/2014 Imaging   CT chest: Several small bilat faint nodular densities, largest measures 1.3 cm over superior segment LLL. No new nodules, no adenopathy. Cannot exclude metastatic disease given pt's h/o melanoma. Recommend PET-CT   02/23/2014 PET scan   Persistent semi-solid nodule in superior segment LLL with low metabolic activity. Moderately metabolic subcarinal & hilar LN are likely reactive.    05/25/2014 Imaging   CT chest: Persistent & similar size dominant LLL sub-solid nodule. Adenocarcinoma is considered. Biopsy or resection strongly considered. Additional scattered small ground-glass nodules stable.    06/02/2014 Imaging  MRI brain: Grossly stable appearance of left planum sphenoidale meningioma measuring 20 x 14 mm. Meningioma does not extend to left orbital apex & contacts left optic nerve. Stable right posterior clinoid meningioma .    09/27/2014 Imaging    CT chest: Further enlargment of central solid component in sub-solid LLL pulm nodule. Findings remain concerning for adenoca. Additional scattered small bilat ground-glass nodules and biapical scarring stable. No adenopathy.     11/25/2014 Surgery   Video bronchoscopy with endobronchial ultrasound; Video assisted thoracoscopy; Thoracoscopic LLL superior segmentectomy; Mediastinal lymph node dissection; Cyro intercostal nerve block Arnaldo)   11/25/2014 Pathology Results   Superior segment lung resection (LLL). Adenocarcinoma, well-diff, spanning 1.5 cm. Surgical margins negative. 5 LNs negative.    11/25/2014 Pathologic Stage   pT1a, pN0: Stage IA    11/25/2014 Initial Diagnosis   Primary cancer of left lower lobe of lung (HCC)   05/31/2015 Imaging   CT chest: No evidence of local recurrent at superior segmentectomy in LLL. New small nodular focus of consolidation surrounding thin parenchymal band. Additional sub-cm ground-glass and solid nodules favored to be benign. No thoracic adenopathy   08/11/2015 Imaging   CT chest: No evidence of residual or recurrent tumor in LLL. Stable small nodular focus in LLL with thin surrounding parenchymal band, favored to be benign.  Multiple small ground-glass nodules unchanged.    06/19/2017 Pathology Results   Diagnosis 1. Lung, biopsy, Left lower lobe - ADENOCARCINOMA. - SEE COMMENT. 2. Lung, biopsy, Left lower lobe - BENIGN LUNG PARENCHYMA. - THERE IS NO EVIDENCE OF MALIGNANCY.   07/09/2017 - 07/22/2017 Radiation Therapy   Radiation treatment dates: 07/09/17-07/22/17 by Dr. Dewey    Site/dose:  Left lung/ 50 Gy in 10 fractions   Beams/energy:  IMRT/ 6X   Narrative: The patient tolerated radiation treatment relatively well. She denied pain, fatigue, or difficulty swallowing. Skin to the treatment area appears warm, dry, and normal in color.   06/18/2019 Imaging   CT Chest  IMPRESSION: 1. There are 2 round solid nodules within the left lower  lobe which have increased in size when compared with the previous exam, suspicious. Additionally, along the area of postsurgical change around the oblique fissure of the left lung there is progressive thickening with persistent loculated fluid. Cannot rule out local tumor recurrence along the suture line. 2. No significant change in the appearance of bilateral upper lobe sub solid lung nodules. 3. Aortic Atherosclerosis (ICD10-I70.0). Coronary artery calcifications.     07/07/2019 PET scan   IMPRESSION: 1. No evidence of hypermetabolic local tumor recurrence in the left lung. Low level nonfocal uptake along the segmentectomy site in the left lower lobe favors post treatment change. Continued chest CT surveillance warranted. 2. Two small solid left lower lobe pulmonary nodules, largest 0.7 cm, below PET resolution. Given the growth of these nodules since 10/03/2018 chest CT, metastatic disease remains on the differential. Close chest CT surveillance recommended. 3. Nonspecific hypermetabolism within nonenlarged subcarinal and bilateral hilar lymph nodes, not substantially changed since 03/04/2017 PET-CT study, favoring reactive uptake. 4. No hypermetabolic distant metastatic disease. 5. Chronic findings include: Aortic Atherosclerosis (ICD10-I70.0). Subcentimeter ground-glass pulmonary nodules in the upper lobes are unchanged and are below PET resolution. Moderate sigmoid diverticulosis. Chronic right maxillary sinusitis.   04/13/2020 Imaging   CT Chest  IMPRESSION: 1. Primarily similar appearance of the lungs compared to 10/14/2019. Left sided surgical sutures with similar solid and subsolid pulmonary nodules as detailed above. A right middle lobe pleural based 5 mm  nodule is new since the prior exam, warranting follow-up attention. 2. No thoracic adenopathy. 3. Aortic atherosclerosis (ICD10-I70.0), coronary artery atherosclerosis and emphysema (ICD10-J43.9).   10/10/2020  Imaging   CT chest  IMPRESSION: Postsurgical and post radiation changes in the lungs bilaterally, as above.   12 x 10 mm left lower lobe nodule, mildly progressive. Additional bilobed nodule in the posterior left lower lobe, also favored to be progressive. PET-CT is suggested for further evaluation.   Aortic Atherosclerosis (ICD10-I70.0) and Emphysema (ICD10-J43.9).   11/22/2020 - 12/02/2020 Radiation Therapy   Site Technique Total Dose (Gy) Dose per Fx (Gy) Completed Fx Beam Energies  Lung, Left: Lung_Lt_LLL IMRT 60/60 12 5/5 6XFFF     05/22/2021 Imaging   EXAM: CT CHEST WITHOUT CONTRAST  IMPRESSION: 1. Interval development of a new area of volume loss, ground-glass opacity, and bronchiectasis in the posterior left lower lobe. Imaging features are compatible with radiation therapy. 2. The posterior left lung nodules, including index 12 x 10 mm nodule described previously have become incorporated into the post treatment change on the current study. 3. Interval resolution of the posterior right middle lobe nodularity seen previously. 4. Other scattered areas of consolidative opacity or similar. 5. Trace left pleural effusion. 6. Aortic Atherosclerosis (ICD10-I70.0).      Discussed the use of AI scribe software for clinical note transcription with the patient, who gave verbal consent to proceed.  History of Present Illness Melissa Snyder is an 89 year old female with metastatic non-small cell lung cancer who presents with episodes of lightheadedness and a sensation of feeling 'drunk'.  She began experiencing episodes of lightheadedness and a sensation of feeling 'drunk' without alcohol  consumption yesterday after leaving church. She felt unsteady and required assistance to reach her car. At home, she continued to feel unsteady and took meclizine, resulting in prolonged sleep and lethargy. Today, she continues to feel lightheaded and indifferent to moving.  She has been on  Tagrisso  since January 2024 for metastatic non-small cell lung cancer. Her recent CT scan and blood counts show low white and platelet counts. No significant changes in her breathing.  Her medication list includes lisinopril , which was reduced to 5 mg a month ago and discontinued two weeks ago. She also takes omega-3 supplements, replaced with cod liver oil due to difficulty swallowing large pills. No weakness but describes a staggering sensation.     All other systems were reviewed with the patient and are negative.  MEDICAL HISTORY:  Past Medical History:  Diagnosis Date   Adenocarcinoma (HCC)    recurrent/notes 07/03/2017   Blind left eye 10/2013   cva   Breast cancer (HCC) 05/2014   ER+/PR+/Her2-     LUNG CANCER   Bundle branch block left    Chest pain    Family history of bladder cancer    Family history of breast cancer    Family history of prostate cancer    GERD (gastroesophageal reflux disease)    History of blood transfusion    age 81   Hyperlipidemia    Hypertension    Hypothyroid    Lung nodule    Melanoma in situ of face (HCC) JUNE 2015   LEFT CHEEK   Meningioma (HCC)    brain lining   Migraine    reports only having one   Osteoarthritis    Personal history of radiation therapy    Radiation 09/16/14-10/07/14   Right  Breast  21 fractions   Skin cancer  Stroke [facility]) 2015   Blind in left eye as a result    SURGICAL HISTORY: Past Surgical History:  Procedure Laterality Date   ABDOMINAL HYSTERECTOMY  ~ 1997   APPENDECTOMY     age 29   BILATERAL SALPINGOOPHORECTOMY     BLADDER REPAIR  2009   cystocele   BRAIN MENINGIOMA EXCISION     Gamma knife to Meningioma in brain lining   BREAST BIOPSY     BREAST LUMPECTOMY Right 07/23/2014   invasive ductal   BUNIONECTOMY     bilateral great toe   CARDIAC CATHETERIZATION  2004   CHOLECYSTECTOMY  ~ 1997   COLONOSCOPY     CRYO INTERCOSTAL NERVE BLOCK Left 11/25/2014   Procedure: CRYO INTERCOSTAL NERVE BLOCK;   Surgeon: Elspeth JAYSON Millers, MD;  Location: [facility] OR;  Service: Thoracic;  Laterality: Left;   CT RADIATION THERAPY GUIDE     Gamma radiation -lt frontal-[facility]   DILATION AND CURETTAGE OF UTERUS     X 2   HIP ARTHROPLASTY Left 07/04/2017   Procedure: ARTHROPLASTY BIPOLAR HIP (HEMIARTHROPLASTY);  Surgeon: Dozier Soulier, MD;  Location: [facility] OR;  Service: Orthopedics;  Laterality: Left;   IR THORACENTESIS ASP PLEURAL SPACE W/IMG GUIDE  02/27/2023   MELANOMA EXCISION Left 11/05/23, 11/30/13   CHEEK   SEGMENTECOMY Left 11/25/2014   Procedure: LEFT LOWER LOBE SEGMENTECTOMY;  Surgeon: Elspeth JAYSON Millers, MD;  Location: [facility] OR;  Service: Thoracic;  Laterality: Left;   VIDEO ASSISTED THORACOSCOPY Left 11/25/2014   Procedure: VIDEO ASSISTED THORACOSCOPY;  Surgeon: Elspeth JAYSON Millers, MD;  Location: [facility] OR;  Service: Thoracic;  Laterality: Left;   VIDEO BRONCHOSCOPY WITH ENDOBRONCHIAL NAVIGATION N/A 06/19/2017   Procedure: VIDEO BRONCHOSCOPY;  Surgeon: Millers Elspeth JAYSON, MD;  Location: [facility] OR;  Service: Thoracic;  Laterality: N/A;   VIDEO BRONCHOSCOPY WITH ENDOBRONCHIAL ULTRASOUND N/A 11/25/2014   Procedure: VIDEO BRONCHOSCOPY WITH ENDOBRONCHIAL ULTRASOUND;  Surgeon: Elspeth JAYSON Millers, MD;  Location: [facility] OR;  Service: Thoracic;  Laterality: N/A;    I have reviewed the social history and family history with the patient and they are unchanged from previous note.  ALLERGIES:  is allergic to beef-derived drug products, other, chocolate, ibuprofen, niacin and related, and tramadol.  MEDICATIONS:  Current Outpatient Medications  Medication Sig Dispense Refill   aspirin  81 MG EC tablet Take 81 mg by mouth at bedtime.     atenolol  (TENORMIN ) 50 MG tablet Take 50 mg by mouth at bedtime.      b complex vitamins capsule Take 1 capsule by mouth daily.     calcium -vitamin D  (OSCAL WITH D) 500-200 MG-UNIT per tablet Take 1 tablet by mouth at bedtime.      Cholecalciferol  (VITAMIN D3) 1000 units CAPS Take 1,000  Units by mouth at bedtime.     co-enzyme Q-10 30 MG capsule Take 30 mg by mouth daily.     denosumab  (PROLIA ) 60 MG/ML SOSY injection Inject 60 mg into the skin every 6 (six) months.     Digestive Enzymes (ENZYME DIGEST PO) Take 5 mLs by mouth 3 (three) times daily with meals.     levothyroxine  (SYNTHROID , LEVOTHROID) 112 MCG tablet Take 112 mcg by mouth daily before breakfast.      lisinopril  (PRINIVIL ,ZESTRIL ) 10 MG tablet Take 1 tablet (10 mg total) by mouth daily. (Patient taking differently: Take 10 mg by mouth 2 (two) times daily.) 30 tablet 1   meclizine (ANTIVERT) 12.5 MG tablet Take 12.5 mg by mouth 3 (three) times daily as  needed for dizziness.     Multiple Vitamin (MULTIVITAMIN) tablet Take 1 tablet by mouth every morning.      nystatin  (MYCOSTATIN ) 100000 UNIT/ML suspension Take 5 mLs by mouth 4 (four) times daily.     osimertinib  mesylate (TAGRISSO ) 40 MG tablet Take 1 tablet (40 mg total) by mouth daily. 30 tablet 2   Triamcinolone Acetonide (NASACORT AQ NA) Place 1 spray into the nose at bedtime as needed (CONGESTION).     vitamin E 180 MG (400 UNITS) capsule Take 400 Units by mouth every Monday, Wednesday, and Friday. Mon Wed Fri     No current facility-administered medications for this visit.    PHYSICAL EXAMINATION: ECOG PERFORMANCE STATUS: 2 - Symptomatic, <50% confined to bed  Vitals:   01/27/24 1127 01/27/24 1131  BP: (!) 150/70 (!) 152/70  Pulse: 80   Resp: 18   Temp: 97.8 F (36.6 C)   SpO2: 99%    Wt Readings from Last 3 Encounters:  01/27/24 104 lb 6.4 oz (47.4 kg)  11/25/23 106 lb 11.2 oz (48.4 kg)  09/25/23 106 lb 14.4 oz (48.5 kg)     GENERAL:alert, no distress and comfortable SKIN: skin color, texture, turgor are normal, no rashes or significant lesions EYES: normal, Conjunctiva are pink and non-injected, sclera clear NECK: supple, thyroid  normal size, non-tender, without nodularity LYMPH:  no palpable lymphadenopathy in the cervical, axillary   LUNGS: clear to auscultation and percussion with normal breathing effort HEART: regular rate & rhythm and no murmurs and no lower extremity edema ABDOMEN:abdomen soft, non-tender and normal bowel sounds Musculoskeletal:no cyanosis of digits and no clubbing  NEURO: alert & oriented x 3 with fluent speech, no focal motor/sensory deficits  Physical Exam    LABORATORY DATA:  I have reviewed the data as listed    Latest Ref Rng & Units 01/27/2024   10:47 AM 11/25/2023   10:11 AM 09/18/2023    9:28 AM  CBC  WBC 4.0 - 10.5 K/uL 3.3  3.0  4.2   Hemoglobin 12.0 - 15.0 g/dL 86.8  87.8  87.6   Hematocrit 36.0 - 46.0 % 39.3  36.3  37.4   Platelets 150 - 400 K/uL 90  84  102         Latest Ref Rng & Units 01/27/2024   10:47 AM 11/25/2023   10:11 AM 09/18/2023    9:28 AM  CMP  Glucose 70 - 99 mg/dL 864  895  880   BUN 8 - 23 mg/dL 24  23  26    Creatinine 0.44 - 1.00 mg/dL 9.31  9.26  9.23   Sodium 135 - 145 mmol/L 139  140  139   Potassium 3.5 - 5.1 mmol/L 4.2  4.1  4.2   Chloride 98 - 111 mmol/L 102  103  103   CO2 22 - 32 mmol/L 33  33  32   Calcium  8.9 - 10.3 mg/dL 9.2  9.4  9.5   Total Protein 6.5 - 8.1 g/dL 6.9  6.8  7.0   Total Bilirubin 0.0 - 1.2 mg/dL 0.6  0.6  0.6   Alkaline Phos 38 - 126 U/L 86  70  74   AST 15 - 41 U/L 24  26  25    ALT 0 - 44 U/L 23  25  24        RADIOGRAPHIC STUDIES: I have personally reviewed the radiological images as listed and agreed with the findings in the report. No results found.  No orders of the defined types were placed in this encounter.  All questions were answered. The patient knows to call the clinic with any problems, questions or concerns. No barriers to learning was detected. The total time spent in the appointment was 30 minutes, including review of chart and various tests results, discussions about plan of care and coordination of care plan     Onita Mattock, MD 01/27/2024

## 2024-01-31 ENCOUNTER — Other Ambulatory Visit: Payer: Self-pay | Admitting: Hematology

## 2024-01-31 ENCOUNTER — Other Ambulatory Visit: Payer: Self-pay

## 2024-01-31 MED ORDER — OSIMERTINIB MESYLATE 40 MG PO TABS
40.0000 mg | ORAL_TABLET | Freq: Every day | ORAL | 2 refills | Status: DC
  Filled 2024-02-04 (×2): qty 30, 30d supply, fill #0
  Filled 2024-03-06: qty 30, 30d supply, fill #1
  Filled 2024-04-01: qty 30, 30d supply, fill #2

## 2024-02-04 ENCOUNTER — Other Ambulatory Visit: Payer: Self-pay

## 2024-02-04 NOTE — Progress Notes (Signed)
 Specialty Pharmacy Refill Coordination Note  Melissa Snyder is a 89 y.o. female contacted today regarding refills of specialty medication(s) Osimertinib  Mesylate (TAGRISSO )   Patient requested Delivery   Delivery date: 02/11/24   Verified address: [address]   [place] [address]   Medication will be filled on 09.08.25.

## 2024-02-10 ENCOUNTER — Other Ambulatory Visit: Payer: Self-pay

## 2024-03-06 ENCOUNTER — Other Ambulatory Visit: Payer: Self-pay

## 2024-03-06 NOTE — Progress Notes (Signed)
 Specialty Pharmacy Refill Coordination Note  Melissa Snyder is a 89 y.o. female contacted today regarding refills of specialty medication(s) Osimertinib  Mesylate (TAGRISSO )   Patient requested Delivery   Delivery date: 03/10/24   Verified address: [address]   [place] [address]   Medication will be filled on 03/09/24.

## 2024-04-01 ENCOUNTER — Other Ambulatory Visit: Payer: Self-pay

## 2024-04-03 ENCOUNTER — Other Ambulatory Visit: Payer: Self-pay | Admitting: Pharmacy Technician

## 2024-04-03 ENCOUNTER — Other Ambulatory Visit: Payer: Self-pay

## 2024-04-03 NOTE — Progress Notes (Signed)
 Specialty Pharmacy Refill Coordination Note  Melissa Snyder is a 89 y.o. female contacted today regarding refills of specialty medication(s) Osimertinib  Mesylate (TAGRISSO )   Patient requested Delivery   Delivery date: 04/08/24   Verified address: [address]  [place]   Medication will be filled on: 04/07/24

## 2024-04-06 ENCOUNTER — Other Ambulatory Visit: Payer: Self-pay

## 2024-04-19 NOTE — Assessment & Plan Note (Signed)
 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 Kerrin, pathology showing stage I adenocarcinoma -She had cancer recurrence in LLL in 06/2017 and in LUL in 10/2020. She was treated with radiation in 07/2017 and in 11/2020 with Dr. Dewey.  -She developed cancer recurrence with malignant left pleural effusion in November 2023 -[facility] showed EGFR L858R mutation(+), which indicating good response to EGFR inhibitor, I have started her on osimertinib  in Jan 2024 -she has slightly prolonged QTc, will monitor closely when she is on Osimertinib , repeated EKG showed normal QTc --PET scan 08/02/2022 showed mildly hypermetabolic right upper lung nodule, morphologically benign appearing mediastinal and hilar lymphadenopathy with metabolic activities, no other definitive metastasis.  The left pleura is not hypermetabolic on the PET scan. -She previously tolerated Tagrisso  40 mg daily very well, I increased the dose to 80 mg daily but did not tolerated well, and have reduced dose back to 40 mg daily -PET 05/10/2023 showed no significant cancer progression, with minimal activity in lymph nodes and pleura. Her pleural effusion increased and required thoracentesis in early December 2024, with clinical concern for disease progression. -Due to her advanced age, she is not interested in chemotherapy -restaging CT 09/18/2023 and 01/13/2024 showed persistent small left pleural effusion, no evidence of progression, will continue current therapy

## 2024-04-20 ENCOUNTER — Inpatient Hospital Stay (HOSPITAL_BASED_OUTPATIENT_CLINIC_OR_DEPARTMENT_OTHER): Admitting: Hematology

## 2024-04-20 ENCOUNTER — Inpatient Hospital Stay: Attending: Hematology

## 2024-04-20 ENCOUNTER — Other Ambulatory Visit: Payer: Self-pay

## 2024-04-20 VITALS — BP 144/58 | HR 77 | Temp 97.6°F | Resp 15 | Ht 65.0 in | Wt 109.1 lb

## 2024-04-20 DIAGNOSIS — J910 Malignant pleural effusion: Secondary | ICD-10-CM | POA: Diagnosis not present

## 2024-04-20 DIAGNOSIS — C3432 Malignant neoplasm of lower lobe, left bronchus or lung: Secondary | ICD-10-CM | POA: Diagnosis not present

## 2024-04-20 DIAGNOSIS — D6959 Other secondary thrombocytopenia: Secondary | ICD-10-CM | POA: Insufficient documentation

## 2024-04-20 DIAGNOSIS — C50411 Malignant neoplasm of upper-outer quadrant of right female breast: Secondary | ICD-10-CM

## 2024-04-20 DIAGNOSIS — Z79899 Other long term (current) drug therapy: Secondary | ICD-10-CM | POA: Diagnosis not present

## 2024-04-20 LAB — CBC WITH DIFFERENTIAL (CANCER CENTER ONLY)
Abs Immature Granulocytes: 0.01 K/uL (ref 0.00–0.07)
Basophils Absolute: 0 K/uL (ref 0.0–0.1)
Basophils Relative: 1 %
Eosinophils Absolute: 0.1 K/uL (ref 0.0–0.5)
Eosinophils Relative: 2 %
HCT: 36.1 % (ref 36.0–46.0)
Hemoglobin: 11.8 g/dL — ABNORMAL LOW (ref 12.0–15.0)
Immature Granulocytes: 0 %
Lymphocytes Relative: 15 %
Lymphs Abs: 0.6 K/uL — ABNORMAL LOW (ref 0.7–4.0)
MCH: 29.4 pg (ref 26.0–34.0)
MCHC: 32.7 g/dL (ref 30.0–36.0)
MCV: 89.8 fL (ref 80.0–100.0)
Monocytes Absolute: 0.4 K/uL (ref 0.1–1.0)
Monocytes Relative: 11 %
Neutro Abs: 2.6 K/uL (ref 1.7–7.7)
Neutrophils Relative %: 71 %
Platelet Count: 94 K/uL — ABNORMAL LOW (ref 150–400)
RBC: 4.02 MIL/uL (ref 3.87–5.11)
RDW: 14.1 % (ref 11.5–15.5)
WBC Count: 3.7 K/uL — ABNORMAL LOW (ref 4.0–10.5)
nRBC: 0 % (ref 0.0–0.2)

## 2024-04-20 LAB — CMP (CANCER CENTER ONLY)
ALT: 20 U/L (ref 0–44)
AST: 22 U/L (ref 15–41)
Albumin: 4.1 g/dL (ref 3.5–5.0)
Alkaline Phosphatase: 67 U/L (ref 38–126)
Anion gap: 4 — ABNORMAL LOW (ref 5–15)
BUN: 29 mg/dL — ABNORMAL HIGH (ref 8–23)
CO2: 31 mmol/L (ref 22–32)
Calcium: 9.3 mg/dL (ref 8.9–10.3)
Chloride: 103 mmol/L (ref 98–111)
Creatinine: 0.75 mg/dL (ref 0.44–1.00)
GFR, Estimated: >60 mL/min (ref >60)
Glucose, Bld: 116 mg/dL — ABNORMAL HIGH (ref 70–99)
Potassium: 4.6 mmol/L (ref 3.5–5.1)
Sodium: 138 mmol/L (ref 135–145)
Total Bilirubin: 0.6 mg/dL (ref 0.0–1.2)
Total Protein: 6.6 g/dL (ref 6.5–8.1)

## 2024-04-20 MED ORDER — OSIMERTINIB MESYLATE 40 MG PO TABS
40.0000 mg | ORAL_TABLET | Freq: Every day | ORAL | 2 refills | Status: AC
  Filled 2024-04-20 – 2024-05-06 (×2): qty 30, 30d supply, fill #0
  Filled 2024-06-02: qty 30, 30d supply, fill #1
  Filled 2024-07-06: qty 30, 30d supply, fill #2

## 2024-04-20 NOTE — Progress Notes (Signed)
 [facility]   Telephone:(336) [phone] Fax:(336) [phone]   Clinic Follow up Note   Patient Care Team: Shayne Anes, MD as PCP - General (Internal Medicine) Kerrin Elspeth BROCKS, MD as Consulting Physician (Cardiothoracic Surgery) Gaston Hamilton, MD as Consulting Physician (Urology) Lanny Callander, MD as Consulting Physician (Hematology) Keenan Hastings, MD as Consulting Physician (Radiation Oncology) Lanell Donald Stagger, PA-C as Physician Assistant (Radiation Oncology) Imaging, [facility] as Consulting Physician (Diagnostic Radiology)  Date of Service:  04/20/2024  CHIEF COMPLAINT: f/u of metastatic non-small cell lung cancer  CURRENT THERAPY:  Tagrisso   Oncology History   Primary cancer of left lower lobe of lung (HCC) Stage I left lung adenocarcinoma, pT1aN0M0 in 2016, LLL recurrence in 06/2017, LUL recurrence in 10/2020, malignant left pleural effusion in 04/2022, EGFR L858R mutation(+) -initially diagnosed in 11/2014, s/p LLL segmentectomy by Dr. Kerrin, pathology showing stage I adenocarcinoma -She had cancer recurrence in LLL in 06/2017 and in LUL in 10/2020. She was treated with radiation in 07/2017 and in 11/2020 with Dr. Dewey.  -She developed cancer recurrence with malignant left pleural effusion in November 2023 -[facility] showed EGFR L858R mutation(+), which indicating good response to EGFR inhibitor, I have started her on osimertinib  in Jan 2024 -she has slightly prolonged QTc, will monitor closely when she is on Osimertinib , repeated EKG showed normal QTc --PET scan 08/02/2022 showed mildly hypermetabolic right upper lung nodule, morphologically benign appearing mediastinal and hilar lymphadenopathy with metabolic activities, no other definitive metastasis.  The left pleura is not hypermetabolic on the PET scan. -She previously tolerated Tagrisso  40 mg daily very well, I increased the dose to 80 mg daily but did not tolerated  well, and have reduced dose back to 40 mg daily -PET 05/10/2023 showed no significant cancer progression, with minimal activity in lymph nodes and pleura. Her pleural effusion increased and required thoracentesis in early December 2024, with clinical concern for disease progression. -Due to her advanced age, she is not interested in chemotherapy -restaging CT 09/18/2023 and 01/13/2024 showed persistent small left pleural effusion, no evidence of progression, will continue current therapy  Assessment & Plan Malignant neoplasm of lower lobe, left lung with malignant pleural effusion The lung cancer is well-managed with no significant changes in symptoms. Occasional sneezing and coughing spells are present, but no significant cough or chest discomfort. Energy levels have improved, and she is able to perform daily activities independently. Lab results, including blood counts and kidney and liver function, are stable. Platelet count is slightly low, consistent with previous results, but remains within a safe range. The disease is considered stable, and Tegresol therapy will continue. - Continue Tegresol therapy. - Ordered CT scan before the next visit in three months. - Scheduled follow-up appointment in three months.  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 No significant changes or concerns at this time. - Continue monitoring platelet count.  Dry eye syndrome, bilateral Bilateral dry eye syndrome is present, with symptoms of dry eyes and difficulty reading street signs while driving. Eye drops have been prescribed and provide some relief. The condition is likely exacerbated by medication but is not believed to be the sole cause of the inability to cry. - Continue using eye drops as needed for dry eyes.   Plan - Clinically stable, lab reviewed, overall stable. - Will continue Tagrisso  - Lab and  follow-up in 3 months, will repeat CT chest before  next visit.  SUMMARY OF ONCOLOGIC HISTORY: Oncology History Overview Note   Cancer Staging  Breast cancer of upper-outer quadrant of right female breast [facility]) Staging form: Breast, AJCC 7th Edition - Clinical stage from 05/19/2014: Stage IA (T1b, N0, M0) - Signed by Letha Truman ORN, NP on 11/17/2015 - Pathologic stage from 10/17/2014: Stage IA (T1b, N0, cM0) - Signed by Keenan Hastings, MD on 10/17/2014  Primary cancer of left lower lobe of lung [facility]) Staging form: Lung, AJCC 7th Edition - Clinical stage from 12/14/2014: Stage IA (T1a, N0, M0) - Unsigned    Breast cancer of upper-outer quadrant of right female breast (HCC)  05/04/2014 Initial Diagnosis   Breast cancer   05/19/2014 Imaging   Diagnostic mammogram and ultrasound showed a 7 mm irregular suspicious mass located in the right breast at the 10:00 position, 7 cm from the nipple. Otherwise negative.   05/19/2014 Initial Biopsy   Right breast biopsy (UOQ): IDC, grade 2.    05/19/2014 Receptors her2   ER 100% positive, PR 35% positive, HER-2 negative, Ki67 8%   06/07/2014 Miscellaneous   Genetic testing normal.  APC, ATM, BARD1, BMPR1A, BRCA1, BRCA2, BRIP1, CDH1, CDK4, CDKN2A, CHEK2, EPCAM, GREM1, MLH1, MRE11A, MSH2, MSH6, MUTYH, NBN, NF1, PALB2, PMS2, POLD1, POLE, PTEN, RAD50, RAD51D, SMAD4, SMARCA4, STK11, and TP53 tested.    07/23/2014 Surgery   Right breast lumpectomy with SLNB Jeoffrey) showed invasive ductal carcinoma & DCIS; negative surgical margins.   07/23/2014 Pathology Results   Invasive ductal carcinoma, tumor size 0.8 cm, grade 1, no lymphovascular invasion, margins were negative, 2 sentinel lymph nodes were negative. (+) DCIS.   07/23/2014 Pathologic Stage   pT1b, pN0: Stage IA    09/16/2014 - 10/07/2014 Radiation Therapy   XRT completed Signe). Right breast/ 42.72 Gy at 2.67 Gy per fraction x 21 fractions.     Anti-estrogen oral therapy   Aromasin   prescribed in 01/2015 but Patient declines anti-estrogen therapy at this time d/t high co-pay of medication and possible side effects of AI therapy.    05/02/2015 Mammogram   Diagnostic TOMO bilat mammogram: No evidence of malignancy in either breast. Lumpectomy changes in right breast. Diagnostic mammo suggested in 1 year.    Primary cancer of left lower lobe of lung (HCC)  10/25/2013 Imaging   CXR done for stroke protocol. No acute chest findings. Chronic lung disease with biapical scarring. Developing nodule in (L) apex cannot be excluded based on exam, athough may be d/t overlap of osseous structures.    10/27/2013 Imaging   CXR: Nodular opacity in mid (L) apex and appears more prominent than prior study. Area that appeared masslike in LUL near apex on recent CXR not seen at this time.  Underlying emphysema present. No edema or consolidation   11/12/2013 Imaging   CT chest: At left lung apex, there is asymmetric pleural parenchymal scarring. No discrete lesion.  In superior segment of LLL, there are 1 dominant ground-glass opacity measuring 16 mm. 2 addt'l ground-glass opacities appreciated. F/U with CT in 3 months   02/11/2014 Imaging   CT chest: Several small bilat faint nodular densities, largest measures 1.3 cm over superior segment LLL. No new nodules, no adenopathy. Cannot exclude metastatic disease given pt's h/o melanoma. Recommend PET-CT   02/23/2014 PET scan   Persistent semi-solid nodule in superior segment LLL with low metabolic activity. Moderately metabolic subcarinal & hilar LN are likely reactive.    05/25/2014 Imaging   CT chest: Persistent & similar size dominant LLL sub-solid  nodule. Adenocarcinoma is considered. Biopsy or resection strongly considered. Additional scattered small ground-glass nodules stable.    06/02/2014 Imaging   MRI brain: Grossly stable appearance of left planum sphenoidale meningioma measuring 20 x 14 mm. Meningioma does not extend to left orbital apex  & contacts left optic nerve. Stable right posterior clinoid meningioma .    09/27/2014 Imaging   CT chest: Further enlargment of central solid component in sub-solid LLL pulm nodule. Findings remain concerning for adenoca. Additional scattered small bilat ground-glass nodules and biapical scarring stable. No adenopathy.     11/25/2014 Surgery   Video bronchoscopy with endobronchial ultrasound; Video assisted thoracoscopy; Thoracoscopic LLL superior segmentectomy; Mediastinal lymph node dissection; Cyro intercostal nerve block Arnaldo)   11/25/2014 Pathology Results   Superior segment lung resection (LLL). Adenocarcinoma, well-diff, spanning 1.5 cm. Surgical margins negative. 5 LNs negative.    11/25/2014 Pathologic Stage   pT1a, pN0: Stage IA    11/25/2014 Initial Diagnosis   Primary cancer of left lower lobe of lung (HCC)   05/31/2015 Imaging   CT chest: No evidence of local recurrent at superior segmentectomy in LLL. New small nodular focus of consolidation surrounding thin parenchymal band. Additional sub-cm ground-glass and solid nodules favored to be benign. No thoracic adenopathy   08/11/2015 Imaging   CT chest: No evidence of residual or recurrent tumor in LLL. Stable small nodular focus in LLL with thin surrounding parenchymal band, favored to be benign.  Multiple small ground-glass nodules unchanged.    06/19/2017 Pathology Results   Diagnosis 1. Lung, biopsy, Left lower lobe - ADENOCARCINOMA. - SEE COMMENT. 2. Lung, biopsy, Left lower lobe - BENIGN LUNG PARENCHYMA. - THERE IS NO EVIDENCE OF MALIGNANCY.   07/09/2017 - 07/22/2017 Radiation Therapy   Radiation treatment dates: 07/09/17-07/22/17 by Dr. Dewey    Site/dose:  Left lung/ 50 Gy in 10 fractions   Beams/energy:  IMRT/ 6X   Narrative: The patient tolerated radiation treatment relatively well. She denied pain, fatigue, or difficulty swallowing. Skin to the treatment area appears warm, dry, and normal in color.    06/18/2019 Imaging   CT Chest  IMPRESSION: 1. There are 2 round solid nodules within the left lower lobe which have increased in size when compared with the previous exam, suspicious. Additionally, along the area of postsurgical change around the oblique fissure of the left lung there is progressive thickening with persistent loculated fluid. Cannot rule out local tumor recurrence along the suture line. 2. No significant change in the appearance of bilateral upper lobe sub solid lung nodules. 3. Aortic Atherosclerosis (ICD10-I70.0). Coronary artery calcifications.     07/07/2019 PET scan   IMPRESSION: 1. No evidence of hypermetabolic local tumor recurrence in the left lung. Low level nonfocal uptake along the segmentectomy site in the left lower lobe favors post treatment change. Continued chest CT surveillance warranted. 2. Two small solid left lower lobe pulmonary nodules, largest 0.7 cm, below PET resolution. Given the growth of these nodules since 10/03/2018 chest CT, metastatic disease remains on the differential. Close chest CT surveillance recommended. 3. Nonspecific hypermetabolism within nonenlarged subcarinal and bilateral hilar lymph nodes, not substantially changed since 03/04/2017 PET-CT study, favoring reactive uptake. 4. No hypermetabolic distant metastatic disease. 5. Chronic findings include: Aortic Atherosclerosis (ICD10-I70.0). Subcentimeter ground-glass pulmonary nodules in the upper lobes are unchanged and are below PET resolution. Moderate sigmoid diverticulosis. Chronic right maxillary sinusitis.   04/13/2020 Imaging   CT Chest  IMPRESSION: 1. Primarily similar appearance of the lungs compared to 10/14/2019.  Left sided surgical sutures with similar solid and subsolid pulmonary nodules as detailed above. A right middle lobe pleural based 5 mm nodule is new since the prior exam, warranting follow-up attention. 2. No thoracic adenopathy. 3. Aortic  atherosclerosis (ICD10-I70.0), coronary artery atherosclerosis and emphysema (ICD10-J43.9).   10/10/2020 Imaging   CT chest  IMPRESSION: Postsurgical and post radiation changes in the lungs bilaterally, as above.   12 x 10 mm left lower lobe nodule, mildly progressive. Additional bilobed nodule in the posterior left lower lobe, also favored to be progressive. PET-CT is suggested for further evaluation.   Aortic Atherosclerosis (ICD10-I70.0) and Emphysema (ICD10-J43.9).   11/22/2020 - 12/02/2020 Radiation Therapy   Site Technique Total Dose (Gy) Dose per Fx (Gy) Completed Fx Beam Energies  Lung, Left: Lung_Lt_LLL IMRT 60/60 12 5/5 6XFFF     05/22/2021 Imaging   EXAM: CT CHEST WITHOUT CONTRAST  IMPRESSION: 1. Interval development of a new area of volume loss, ground-glass opacity, and bronchiectasis in the posterior left lower lobe. Imaging features are compatible with radiation therapy. 2. The posterior left lung nodules, including index 12 x 10 mm nodule described previously have become incorporated into the post treatment change on the current study. 3. Interval resolution of the posterior right middle lobe nodularity seen previously. 4. Other scattered areas of consolidative opacity or similar. 5. Trace left pleural effusion. 6. Aortic Atherosclerosis (ICD10-I70.0).      Discussed the use of AI scribe software for clinical note transcription with the patient, who gave verbal consent to proceed.  History of Present Illness Melissa Snyder is an 89 year old female with non-small cell lung cancer who presents for follow-up.  She experiences occasional sneezing and coughing spells. Her energy levels have improved, enabling her to care for herself and her husband. Her platelet count remains low, previously attributed to Tagrisso . She inquires about the effect of her cancer treatment on her hair, specifically regarding perms.     All other systems were reviewed with the patient  and are negative.  MEDICAL HISTORY:  Past Medical History:  Diagnosis Date   Adenocarcinoma (HCC)    recurrent/notes 07/03/2017   Blind left eye 10/2013   cva   Breast cancer (HCC) 05/2014   ER+/PR+/Her2-     LUNG CANCER   Bundle branch block left    Chest pain    Family history of bladder cancer    Family history of breast cancer    Family history of prostate cancer    GERD (gastroesophageal reflux disease)    History of blood transfusion    age 77   Hyperlipidemia    Hypertension    Hypothyroid    Lung nodule    Melanoma in situ of face (HCC) JUNE 2015   LEFT CHEEK   Meningioma (HCC)    brain lining   Migraine    reports only having one   Osteoarthritis    Personal history of radiation therapy    Radiation 09/16/14-10/07/14   Right  Breast  21 fractions   Skin cancer    Stroke (HCC) 2015   Blind in left eye as a result    SURGICAL HISTORY: Past Surgical History:  Procedure Laterality Date   ABDOMINAL HYSTERECTOMY  ~ 1997   APPENDECTOMY     age 29   BILATERAL SALPINGOOPHORECTOMY     BLADDER REPAIR  2009   cystocele   BRAIN MENINGIOMA EXCISION     Gamma knife to Meningioma in brain lining   BREAST  BIOPSY     BREAST LUMPECTOMY Right 07/23/2014   invasive ductal   BUNIONECTOMY     bilateral great toe   CARDIAC CATHETERIZATION  2004   CHOLECYSTECTOMY  ~ 1997   COLONOSCOPY     CRYO INTERCOSTAL NERVE BLOCK Left 11/25/2014   Procedure: CRYO INTERCOSTAL NERVE BLOCK;  Surgeon: Elspeth JAYSON Millers, MD;  Location: [facility] OR;  Service: Thoracic;  Laterality: Left;   CT RADIATION THERAPY GUIDE     Gamma radiation -lt frontal-[facility]   DILATION AND CURETTAGE OF UTERUS     X 2   HIP ARTHROPLASTY Left 07/04/2017   Procedure: ARTHROPLASTY BIPOLAR HIP (HEMIARTHROPLASTY);  Surgeon: Dozier Soulier, MD;  Location: [facility] OR;  Service: Orthopedics;  Laterality: Left;   IR THORACENTESIS ASP PLEURAL SPACE W/IMG GUIDE  02/27/2023   MELANOMA EXCISION Left 11/05/23, 11/30/13   CHEEK    SEGMENTECOMY Left 11/25/2014   Procedure: LEFT LOWER LOBE SEGMENTECTOMY;  Surgeon: Elspeth JAYSON Millers, MD;  Location: [facility] OR;  Service: Thoracic;  Laterality: Left;   VIDEO ASSISTED THORACOSCOPY Left 11/25/2014   Procedure: VIDEO ASSISTED THORACOSCOPY;  Surgeon: Elspeth JAYSON Millers, MD;  Location: [facility] OR;  Service: Thoracic;  Laterality: Left;   VIDEO BRONCHOSCOPY WITH ENDOBRONCHIAL NAVIGATION N/A 06/19/2017   Procedure: VIDEO BRONCHOSCOPY;  Surgeon: Millers Elspeth JAYSON, MD;  Location: [facility] OR;  Service: Thoracic;  Laterality: N/A;   VIDEO BRONCHOSCOPY WITH ENDOBRONCHIAL ULTRASOUND N/A 11/25/2014   Procedure: VIDEO BRONCHOSCOPY WITH ENDOBRONCHIAL ULTRASOUND;  Surgeon: Elspeth JAYSON Millers, MD;  Location: [facility] OR;  Service: Thoracic;  Laterality: N/A;    I have reviewed the social history and family history with the patient and they are unchanged from previous note.  ALLERGIES:  is allergic to bovine (beef) protein-containing drug products, other, chocolate, ibuprofen, niacin and related, and tramadol.  MEDICATIONS:  Current Outpatient Medications  Medication Sig Dispense Refill   aspirin  81 MG EC tablet Take 81 mg by mouth at bedtime.     atenolol  (TENORMIN ) 50 MG tablet Take 50 mg by mouth at bedtime.      b complex vitamins capsule Take 1 capsule by mouth daily.     calcium -vitamin D  (OSCAL WITH D) 500-200 MG-UNIT per tablet Take 1 tablet by mouth at bedtime.      Cholecalciferol  (VITAMIN D3) 1000 units CAPS Take 1,000 Units by mouth at bedtime.     co-enzyme Q-10 30 MG capsule Take 30 mg by mouth daily.     denosumab  (PROLIA ) 60 MG/ML SOSY injection Inject 60 mg into the skin every 6 (six) months.     Digestive Enzymes (ENZYME DIGEST PO) Take 5 mLs by mouth 3 (three) times daily with meals.     levothyroxine  (SYNTHROID , LEVOTHROID) 112 MCG tablet Take 112 mcg by mouth daily before breakfast.      lisinopril  (PRINIVIL ,ZESTRIL ) 10 MG tablet Take 1 tablet (10 mg total) by mouth daily. (Patient  taking differently: Take 10 mg by mouth 2 (two) times daily.) 30 tablet 1   meclizine (ANTIVERT) 12.5 MG tablet Take 12.5 mg by mouth 3 (three) times daily as needed for dizziness.     Multiple Vitamin (MULTIVITAMIN) tablet Take 1 tablet by mouth every morning.      nystatin  (MYCOSTATIN ) 100000 UNIT/ML suspension Take 5 mLs by mouth 4 (four) times daily.     osimertinib  mesylate (TAGRISSO ) 40 MG tablet Take 1 tablet (40 mg total) by mouth daily. 30 tablet 2   Triamcinolone Acetonide (NASACORT AQ NA) Place 1 spray into the nose  at bedtime as needed (CONGESTION).     vitamin E 180 MG (400 UNITS) capsule Take 400 Units by mouth every Monday, Wednesday, and Friday. Mon Wed Fri     No current facility-administered medications for this visit.    PHYSICAL EXAMINATION: ECOG PERFORMANCE STATUS: 2 - Symptomatic, <50% confined to bed  Vitals:   04/20/24 1050 04/20/24 1054  BP: (!) 155/63 (!) 144/58  Pulse: 77   Resp: 15   Temp: 97.6 F (36.4 C)   SpO2: 98%    Wt Readings from Last 3 Encounters:  04/20/24 109 lb 1.6 oz (49.5 kg)  01/27/24 104 lb 6.4 oz (47.4 kg)  11/25/23 106 lb 11.2 oz (48.4 kg)     GENERAL:alert, no distress and comfortable SKIN: skin color, texture, turgor are normal, no rashes or significant lesions EYES: normal, Conjunctiva are pink and non-injected, sclera clear NECK: supple, thyroid  normal size, non-tender, without nodularity LYMPH:  no palpable lymphadenopathy in the cervical, axillary  LUNGS: clear to auscultation and percussion with normal breathing effort HEART: regular rate & rhythm and no murmurs and no lower extremity edema ABDOMEN:abdomen soft, non-tender and normal bowel sounds Musculoskeletal:no cyanosis of digits and no clubbing  NEURO: alert & oriented x 3 with fluent speech, no focal motor/sensory deficits  Physical Exam   LABORATORY DATA:  I have reviewed the data as listed    Latest Ref Rng & Units 04/20/2024   10:07 AM 01/27/2024   10:47 AM  11/25/2023   10:11 AM  CBC  WBC 4.0 - 10.5 K/uL 3.7  3.3  3.0   Hemoglobin 12.0 - 15.0 g/dL 88.1  86.8  87.8   Hematocrit 36.0 - 46.0 % 36.1  39.3  36.3   Platelets 150 - 400 K/uL 94  90  84         Latest Ref Rng & Units 04/20/2024   10:07 AM 01/27/2024   10:47 AM 11/25/2023   10:11 AM  CMP  Glucose 70 - 99 mg/dL 883  864  895   BUN 8 - 23 mg/dL 29  24  23    Creatinine 0.44 - 1.00 mg/dL 9.24  9.31  9.26   Sodium 135 - 145 mmol/L 138  139  140   Potassium 3.5 - 5.1 mmol/L 4.6  4.2  4.1   Chloride 98 - 111 mmol/L 103  102  103   CO2 22 - 32 mmol/L 31  33  33   Calcium  8.9 - 10.3 mg/dL 9.3  9.2  9.4   Total Protein 6.5 - 8.1 g/dL 6.6  6.9  6.8   Total Bilirubin 0.0 - 1.2 mg/dL 0.6  0.6  0.6   Alkaline Phos 38 - 126 U/L 67  86  70   AST 15 - 41 U/L 22  24  26    ALT 0 - 44 U/L 20  23  25        RADIOGRAPHIC STUDIES: I have personally reviewed the radiological images as listed and agreed with the findings in the report. No results found.    Orders Placed This Encounter  Procedures   CT CHEST ABDOMEN PELVIS W CONTRAST    Standing Status:   Future    Expected Date:   07/14/2024    Expiration Date:   04/20/2025    If indicated for the ordered procedure, I authorize the administration of contrast media per Radiology protocol:   Yes    Does the patient have a contrast media/X-ray dye allergy?:  No    Release to patient:   Immediate    If indicated for the ordered procedure, I authorize the administration of oral contrast media per Radiology protocol:   Yes    Preferred imaging location?:   [facility]   All questions were answered. The patient knows to call the clinic with any problems, questions or concerns. No barriers to learning was detected. The total time spent in the appointment was 30 minutes, including review of chart and various tests results, discussions about plan of care and coordination of care plan     Onita Mattock, MD 04/20/2024

## 2024-05-02 ENCOUNTER — Other Ambulatory Visit: Payer: Self-pay

## 2024-05-02 ENCOUNTER — Encounter (HOSPITAL_COMMUNITY): Payer: Self-pay | Admitting: Emergency Medicine

## 2024-05-02 ENCOUNTER — Ambulatory Visit (HOSPITAL_COMMUNITY)
Admission: EM | Admit: 2024-05-02 | Discharge: 2024-05-02 | Disposition: A | Discharge status: Home / Self Care | Attending: Family Medicine | Admitting: Family Medicine

## 2024-05-02 DIAGNOSIS — R070 Pain in throat: Secondary | ICD-10-CM | POA: Diagnosis not present

## 2024-05-02 DIAGNOSIS — J069 Acute upper respiratory infection, unspecified: Secondary | ICD-10-CM | POA: Diagnosis not present

## 2024-05-02 LAB — POCT RAPID STREP A (OFFICE): Rapid Strep A Screen: NEGATIVE

## 2024-05-02 LAB — POC COVID19/FLU A&B COMBO
Covid Antigen, POC: NEGATIVE
Influenza A Antigen, POC: NEGATIVE
Influenza B Antigen, POC: NEGATIVE

## 2024-05-02 MED ORDER — BENZONATATE 100 MG PO CAPS: 100.0000 mg | ORAL_CAPSULE | Freq: Three times a day (TID) | ORAL | 0 refills | Status: AC | PRN

## 2024-05-02 NOTE — Discharge Instructions (Signed)
 The testing for flu and COVID was negative.  Your strep test is negative.  Culture of the throat will be sent, and staff will notify you if that is in turn positive.  Most likely this is some other virus causing your symptoms.  Take benzonatate  100 mg, 1 tab every 8 hours as needed for cough.  Please take Tylenol /acetaminophen  500 mg--2 every 6 hours as needed for pain or fever.  Cepacol spray can also be helpful.  Please make sure you are getting plenty of oral fluids in.  Cold and soft foods and drinks will be tolerated best.  Also acidic, crunchy, or very salty foods will probably not be tolerated well.

## 2024-05-02 NOTE — ED Triage Notes (Signed)
 Thanksgiving day was sneezing and coughing all day.  Yesterday was still coughing and sneezing, but throat was sore.  Today only complains of a sore throat.  Denies fever  Has not taken any medications.  Did gargle with warm salt water  last night.

## 2024-05-02 NOTE — ED Provider Notes (Signed)
 [facility]    CSN: [id number] Arrival date & time: 05/02/24  0931      History   Chief Complaint Chief Complaint  Patient presents with   Sore Throat    HPI Melissa Snyder is a 89 y.o. female.    Sore Throat  Here for sore throat and cough and nasal congestion.  On November 27 began having some clear rhinorrhea and some cough.  That continued yesterday on the 28th but then she started developing some sore throat.  Her throat felt very raw and was worse last night.  She did try some warm salt gargles but at that point was coughing too much to continue to do so.  Today she states she is not coughing as much, but she states it also seems to worsen when she is talking more and has not been talking much this morning.  She is still having some nasal congestion.  No fever at home No nausea or vomiting or diarrhea.  She is allergic to ibuprofen and niacin and tramadol.  Past medical history includes lung cancer, for which she takes Tagrisso .  Past Medical History:  Diagnosis Date   Adenocarcinoma (HCC)    recurrent/notes 07/03/2017   Blind left eye 10/2013   cva   Breast cancer (HCC) 05/2014   ER+/PR+/Her2-     LUNG CANCER   Bundle branch block left    Chest pain    Family history of bladder cancer    Family history of breast cancer    Family history of prostate cancer    GERD (gastroesophageal reflux disease)    History of blood transfusion    age 73   Hyperlipidemia    Hypertension    Hypothyroid    Lung nodule    Melanoma in situ of face (HCC) JUNE 2015   LEFT CHEEK   Meningioma (HCC)    brain lining   Migraine    reports only having one   Osteoarthritis    Personal history of radiation therapy    Radiation 09/16/14-10/07/14   Right  Breast  21 fractions   Skin cancer    Stroke (HCC) 2015   Blind in left eye as a result    Patient Active Problem List   Diagnosis Date Noted   Protein-calorie malnutrition, severe 05/25/2023   CAP (community  acquired pneumonia) 05/22/2023   Weight loss 01/07/2023   Generalized weakness 01/07/2023   Hypothyroidism due to acquired atrophy of thyroid  07/09/2017   Constipation due to opioid therapy 07/09/2017   Osteoporosis 07/09/2017   Anemia associated with acute blood loss 07/09/2017   Closed fracture of acetabulum with dislocation of left hip (HCC) 07/03/2017   S/p left hip fracture 07/03/2017   Primary cancer of left lower lobe of lung (HCC) 11/25/2014   Genetic testing 07/01/2014   Family history of breast cancer    Family history of bladder cancer    Family history of prostate cancer    Breast cancer of upper-outer quadrant of right female breast (HCC) 05/04/2014   Arthritis    Hyperlipidemia 01/12/2014   Meningioma (HCC) 01/04/2014   Melanoma in situ of face (HCC) 11/02/2013   Central retinal artery occlusion of left eye 10/24/2013   GERD (gastroesophageal reflux disease) 10/24/2013    Past Surgical History:  Procedure Laterality Date   ABDOMINAL HYSTERECTOMY  ~ 1997   APPENDECTOMY     age 89   BILATERAL SALPINGOOPHORECTOMY     BLADDER REPAIR  2009  cystocele   BRAIN MENINGIOMA EXCISION     Gamma knife to Meningioma in brain lining   BREAST BIOPSY     BREAST LUMPECTOMY Right 07/23/2014   invasive ductal   BUNIONECTOMY     bilateral great toe   CARDIAC CATHETERIZATION  2004   CHOLECYSTECTOMY  ~ 1997   COLONOSCOPY     CRYO INTERCOSTAL NERVE BLOCK Left 11/25/2014   Procedure: CRYO INTERCOSTAL NERVE BLOCK;  Surgeon: Elspeth JAYSON Millers, MD;  Location: [facility] OR;  Service: Thoracic;  Laterality: Left;   CT RADIATION THERAPY GUIDE     Gamma radiation -lt frontal-[facility]   DILATION AND CURETTAGE OF UTERUS     X 2   HIP ARTHROPLASTY Left 07/04/2017   Procedure: ARTHROPLASTY BIPOLAR HIP (HEMIARTHROPLASTY);  Surgeon: Dozier Soulier, MD;  Location: [facility] OR;  Service: Orthopedics;  Laterality: Left;   IR THORACENTESIS ASP PLEURAL SPACE W/IMG GUIDE  02/27/2023   MELANOMA EXCISION Left  11/05/23, 11/30/13   CHEEK   SEGMENTECOMY Left 11/25/2014   Procedure: LEFT LOWER LOBE SEGMENTECTOMY;  Surgeon: Elspeth JAYSON Millers, MD;  Location: [facility] OR;  Service: Thoracic;  Laterality: Left;   VIDEO ASSISTED THORACOSCOPY Left 11/25/2014   Procedure: VIDEO ASSISTED THORACOSCOPY;  Surgeon: Elspeth JAYSON Millers, MD;  Location: [facility] OR;  Service: Thoracic;  Laterality: Left;   VIDEO BRONCHOSCOPY WITH ENDOBRONCHIAL NAVIGATION N/A 06/19/2017   Procedure: VIDEO BRONCHOSCOPY;  Surgeon: Millers Elspeth JAYSON, MD;  Location: [facility] OR;  Service: Thoracic;  Laterality: N/A;   VIDEO BRONCHOSCOPY WITH ENDOBRONCHIAL ULTRASOUND N/A 11/25/2014   Procedure: VIDEO BRONCHOSCOPY WITH ENDOBRONCHIAL ULTRASOUND;  Surgeon: Elspeth JAYSON Millers, MD;  Location: [facility] OR;  Service: Thoracic;  Laterality: N/A;    OB History   No obstetric history on file.      Home Medications    Prior to Admission medications   Medication Sig Start Date End Date Taking? Authorizing Provider  benzonatate  (TESSALON ) 100 MG capsule Take 1 capsule (100 mg total) by mouth 3 (three) times daily as needed for cough. 05/02/24  Yes Vonna Sharlet POUR, MD  aspirin  81 MG EC tablet Take 81 mg by mouth at bedtime. 05/09/09   [provider]  atenolol  (TENORMIN ) 50 MG tablet Take 50 mg by mouth at bedtime.     [provider]  b complex vitamins capsule Take 1 capsule by mouth daily.    [provider]  calcium -vitamin D  (OSCAL WITH D) 500-200 MG-UNIT per tablet Take 1 tablet by mouth at bedtime.     [provider]  Cholecalciferol  (VITAMIN D3) 1000 units CAPS Take 1,000 Units by mouth at bedtime.    [provider]  co-enzyme Q-10 30 MG capsule Take 30 mg by mouth daily.    [provider]  denosumab  (PROLIA ) 60 MG/ML SOSY injection Inject 60 mg into the skin every 6 (six) months. 11/03/15   [provider]  Digestive Enzymes (ENZYME DIGEST PO) Take 5 mLs by mouth 3 (three) times daily with  meals.    [provider]  levothyroxine  (SYNTHROID , LEVOTHROID) 112 MCG tablet Take 112 mcg by mouth daily before breakfast.     [provider]  lisinopril  (PRINIVIL ,ZESTRIL ) 10 MG tablet Take 1 tablet (10 mg total) by mouth daily. Patient taking differently: Take 10 mg by mouth 2 (two) times daily. 11/29/14   Gold, Wayne E, PA-C  meclizine (ANTIVERT) 12.5 MG tablet Take 12.5 mg by mouth 3 (three) times daily as needed for dizziness.    [provider]  Multiple Vitamin (MULTIVITAMIN) tablet Take 1 tablet by mouth every morning.     [provider]  nystatin  (MYCOSTATIN ) 100000 UNIT/ML suspension Take 5 mLs by mouth 4 (four) times daily. 05/15/23   [provider]  osimertinib  mesylate (TAGRISSO ) 40 MG tablet Take 1 tablet (40 mg total) by mouth daily. 04/20/24   Lanny Callander, MD  Triamcinolone Acetonide (NASACORT AQ NA) Place 1 spray into the nose at bedtime as needed (CONGESTION).    [provider]  vitamin E 180 MG (400 UNITS) capsule Take 400 Units by mouth every Monday, Wednesday, and Friday. Mon Wed Fri    [provider]    Family History Family History  Problem Relation Age of Onset   CAD Mother        CABG   Hyperlipidemia Mother    AAA (abdominal aortic aneurysm) Mother    Renal Disease Father    Kidney disease Father    CAD Father    Cancer Father        PROSTATE   Diabetes Brother    Hyperlipidemia Brother    Breast cancer Daughter 61   Breast cancer Sister 34   Breast cancer Sister 50   Cancer Brother        NOS   Bladder Cancer Brother 78   Lung cancer Cousin        3 paternal cousins with lung cancer   Breast cancer Cousin        five paternal first cousins   Cancer Cousin        4 paternal first cousins with Cancer NOS   Cancer Cousin        1 paternal cousin with oral cancer (tongue)    Social History Social History   Tobacco Use   Smoking status: Never   Smokeless tobacco: Never  Vaping Use    Vaping status: Never Used  Substance Use Topics   Alcohol  use: No   Drug use: No     Allergies   Bovine (beef) protein-containing drug products, Other, Chocolate, Ibuprofen, Niacin and related, and Tramadol   Review of Systems Review of Systems   Physical Exam Triage Vital Signs ED Triage Vitals  Encounter Vitals Group     BP 05/02/24 0951 (!) 153/70     Girls Systolic BP Percentile --      Girls Diastolic BP Percentile --      Boys Systolic BP Percentile --      Boys Diastolic BP Percentile --      Pulse Rate 05/02/24 0951 79     Resp 05/02/24 0951 18     Temp 05/02/24 0951 98.1 F (36.7 C)     Temp Source 05/02/24 0951 Oral     SpO2 05/02/24 0951 97 %     Weight --      Height --      Head Circumference --      Peak Flow --      Pain Score 05/02/24 0947 3     Pain Loc --      Pain Education --      Exclude from Growth Chart --    No data found.  Updated Vital Signs BP (!) 153/70 (BP Location: Left Arm)   Pulse 79   Temp 98.1 F (36.7 C) (Oral)   Resp 18   SpO2 97%   Visual Acuity Right Eye Distance:   Left Eye Distance:   Bilateral Distance:    Right Eye Near:  Left Eye Near:    Bilateral Near:     Physical Exam Vitals reviewed.  Constitutional:      General: She is not in acute distress.    Appearance: She is not ill-appearing, toxic-appearing or diaphoretic.  HENT:     Right Ear: Tympanic membrane and ear canal normal.     Left Ear: Tympanic membrane and ear canal normal.     Nose: Congestion present.     Mouth/Throat:     Mouth: Mucous membranes are moist.     Comments: There is mild erythema of the posterior oropharynx.  No tonsillar hypertrophy.  There is clear mucus draining in the oropharynx. Eyes:     Extraocular Movements: Extraocular movements intact.     Conjunctiva/sclera: Conjunctivae normal.     Pupils: Pupils are equal, round, and reactive to light.  Cardiovascular:     Rate and Rhythm: Normal rate and regular rhythm.      Heart sounds: No murmur heard. Pulmonary:     Effort: No respiratory distress.     Breath sounds: No stridor. No wheezing, rhonchi or rales.  Musculoskeletal:     Cervical back: Neck supple.  Lymphadenopathy:     Cervical: No cervical adenopathy.  Skin:    Capillary Refill: Capillary refill takes less than 2 seconds.     Coloration: Skin is not jaundiced or pale.  Neurological:     General: No focal deficit present.     Mental Status: She is alert and oriented to person, place, and time.  Psychiatric:        Behavior: Behavior normal.      UC Treatments / Results  Labs (all labs ordered are listed, but only abnormal results are displayed) Labs Reviewed  CULTURE, GROUP A STREP ([facility])  POC COVID19/FLU A&B COMBO  POCT RAPID STREP A (OFFICE)    EKG   Radiology No results found.  Procedures Procedures (including critical care time)  Medications Ordered in UC Medications - No data to display  Initial Impression / Assessment and Plan / UC Course  I have reviewed the triage vital signs and the nursing notes.  Pertinent labs & imaging results that were available during my care of the patient were reviewed by me and considered in my medical decision making (see chart for details).     Flu and COVID tests are negative  Rapid strep is negative.  Throat culture is sent and we will notify and treat protocol if that is positive  Final Clinical Impressions(s) / UC Diagnoses   Final diagnoses:  Viral URI  Throat pain     Discharge Instructions      The testing for flu and COVID was negative.  Your strep test is negative.  Culture of the throat will be sent, and staff will notify you if that is in turn positive.  Most likely this is some other virus causing your symptoms.  Take benzonatate 100 mg, 1 tab every 8 hours as needed for cough.  Please take Tylenol /acetaminophen  500 mg--2 every 6 hours as needed for pain or fever.  Cepacol spray can also be  helpful.  Please make sure you are getting plenty of oral fluids in.  Cold and soft foods and drinks will be tolerated best.  Also acidic, crunchy, or very salty foods will probably not be tolerated well.     ED Prescriptions     Medication Sig Dispense Auth. Provider   benzonatate (TESSALON) 100 MG capsule Take 1 capsule (100 mg total)  by mouth 3 (three) times daily as needed for cough. 21 capsule Keyoni Lapinski K, MD      PDMP not reviewed this encounter.   Vonna Sharlet POUR, MD 05/02/24 [phone]

## 2024-05-04 ENCOUNTER — Emergency Department (HOSPITAL_COMMUNITY)
Admission: EM | Admit: 2024-05-04 | Discharge: 2024-05-04 | Disposition: A | Discharge status: Home / Self Care | Attending: Emergency Medicine | Admitting: Emergency Medicine

## 2024-05-04 ENCOUNTER — Emergency Department (HOSPITAL_COMMUNITY)

## 2024-05-04 ENCOUNTER — Other Ambulatory Visit: Payer: Self-pay

## 2024-05-04 DIAGNOSIS — J181 Lobar pneumonia, unspecified organism: Secondary | ICD-10-CM | POA: Diagnosis not present

## 2024-05-04 DIAGNOSIS — Z8673 Personal history of transient ischemic attack (TIA), and cerebral infarction without residual deficits: Secondary | ICD-10-CM | POA: Diagnosis not present

## 2024-05-04 DIAGNOSIS — I10 Essential (primary) hypertension: Secondary | ICD-10-CM | POA: Diagnosis not present

## 2024-05-04 DIAGNOSIS — D72819 Decreased white blood cell count, unspecified: Secondary | ICD-10-CM | POA: Insufficient documentation

## 2024-05-04 DIAGNOSIS — J069 Acute upper respiratory infection, unspecified: Secondary | ICD-10-CM | POA: Insufficient documentation

## 2024-05-04 DIAGNOSIS — Z85828 Personal history of other malignant neoplasm of skin: Secondary | ICD-10-CM | POA: Insufficient documentation

## 2024-05-04 DIAGNOSIS — D649 Anemia, unspecified: Secondary | ICD-10-CM | POA: Diagnosis not present

## 2024-05-04 DIAGNOSIS — Z79899 Other long term (current) drug therapy: Secondary | ICD-10-CM | POA: Insufficient documentation

## 2024-05-04 DIAGNOSIS — E039 Hypothyroidism, unspecified: Secondary | ICD-10-CM | POA: Insufficient documentation

## 2024-05-04 DIAGNOSIS — J90 Pleural effusion, not elsewhere classified: Secondary | ICD-10-CM | POA: Diagnosis not present

## 2024-05-04 DIAGNOSIS — I700 Atherosclerosis of aorta: Secondary | ICD-10-CM | POA: Insufficient documentation

## 2024-05-04 DIAGNOSIS — R059 Cough, unspecified: Secondary | ICD-10-CM | POA: Diagnosis not present

## 2024-05-04 DIAGNOSIS — Z85118 Personal history of other malignant neoplasm of bronchus and lung: Secondary | ICD-10-CM | POA: Diagnosis not present

## 2024-05-04 DIAGNOSIS — J984 Other disorders of lung: Secondary | ICD-10-CM | POA: Diagnosis not present

## 2024-05-04 DIAGNOSIS — Z7982 Long term (current) use of aspirin: Secondary | ICD-10-CM | POA: Diagnosis not present

## 2024-05-04 DIAGNOSIS — Z853 Personal history of malignant neoplasm of breast: Secondary | ICD-10-CM | POA: Diagnosis not present

## 2024-05-04 DIAGNOSIS — R0602 Shortness of breath: Secondary | ICD-10-CM | POA: Diagnosis not present

## 2024-05-04 DIAGNOSIS — R918 Other nonspecific abnormal finding of lung field: Secondary | ICD-10-CM | POA: Diagnosis not present

## 2024-05-04 LAB — BASIC METABOLIC PANEL WITH GFR
Anion gap: 8 (ref 5–15)
BUN: 25 mg/dL — ABNORMAL HIGH (ref 8–23)
CO2: 29 mmol/L (ref 22–32)
Calcium: 9 mg/dL (ref 8.9–10.3)
Chloride: 99 mmol/L (ref 98–111)
Creatinine, Ser: 0.74 mg/dL (ref 0.44–1.00)
GFR, Estimated: >60 mL/min (ref >60)
Glucose, Bld: 152 mg/dL — ABNORMAL HIGH (ref 70–99)
Potassium: 4 mmol/L (ref 3.5–5.1)
Sodium: 136 mmol/L (ref 135–145)

## 2024-05-04 LAB — RESPIRATORY PANEL BY PCR

## 2024-05-04 LAB — CBC WITH DIFFERENTIAL/PLATELET
Abs Immature Granulocytes: 0 K/uL (ref 0.00–0.07)
Basophils Absolute: 0 K/uL (ref 0.0–0.1)
Basophils Relative: 1 %
Eosinophils Absolute: 0 K/uL (ref 0.0–0.5)
Eosinophils Relative: 1 %
HCT: 35.1 % — ABNORMAL LOW (ref 36.0–46.0)
Hemoglobin: 11.5 g/dL — ABNORMAL LOW (ref 12.0–15.0)
Immature Granulocytes: 0 %
Lymphocytes Relative: 12 %
Lymphs Abs: 0.3 K/uL — ABNORMAL LOW (ref 0.7–4.0)
MCH: 29.9 pg (ref 26.0–34.0)
MCHC: 32.8 g/dL (ref 30.0–36.0)
MCV: 91.4 fL (ref 80.0–100.0)
Monocytes Absolute: 0.4 K/uL (ref 0.1–1.0)
Monocytes Relative: 14 %
Neutro Abs: 2 K/uL (ref 1.7–7.7)
Neutrophils Relative %: 72 %
Platelets: 74 K/uL — ABNORMAL LOW (ref 150–400)
RBC: 3.84 MIL/uL — ABNORMAL LOW (ref 3.87–5.11)
RDW: 13.9 % (ref 11.5–15.5)
Smear Review: NORMAL
WBC: 2.7 K/uL — ABNORMAL LOW (ref 4.0–10.5)
nRBC: 0 % (ref 0.0–0.2)

## 2024-05-04 LAB — TROPONIN T, HIGH SENSITIVITY
Troponin T High Sensitivity: <15 ng/L (ref 0–19)
Troponin T High Sensitivity: <15 ng/L (ref 0–19)

## 2024-05-04 MED ORDER — FUROSEMIDE 20 MG PO TABS: 20.0000 mg | ORAL_TABLET | Freq: Every day | ORAL | 0 refills | Status: DC

## 2024-05-04 NOTE — Discharge Instructions (Addendum)
 You have a small amount of fluid in your lung.  This is unchanged from August.  I have sent a prescription for medicine called Lasix.  This is a diuretic, and will make you have to urinate many times.  This is normal, and how we can help reduce the fluid in your lung.  I did send a message to your oncologist, they should be reaching out to you this week.  Please follow-up with your primary care doctor within 1 to 2 weeks.  Return to the emergency room if you develop fever or worsening difficulty with your breathing.

## 2024-05-04 NOTE — ED Triage Notes (Signed)
 Pt c/o SOB since last night. Hx of lung cancer currently being treated. Has previously had to have fluid drained of the lung and states this feels the same.

## 2024-05-04 NOTE — ED Provider Notes (Signed)
 [place] EMERGENCY DEPARTMENT AT [facility] Provider Note  CSN: [id number] Arrival date & time: 05/04/24 9183  Chief Complaint(s) Shortness of Breath  HPI KATALEIA QUARANTA is a 89 y.o. female who is here today for shortness of breath, sore throat and cough.  Patient has a history of lung cancer, has previously required thoracentesis.  States this feels similar.  She denies any chest pain, is ambulating with a cane at her usual baseline.   Past Medical History Past Medical History:  Diagnosis Date   Adenocarcinoma (HCC)    recurrent/notes 07/03/2017   Blind left eye 10/2013   cva   Breast cancer (HCC) 05/2014   ER+/PR+/Her2-     LUNG CANCER   Bundle branch block left    Chest pain    Family history of bladder cancer    Family history of breast cancer    Family history of prostate cancer    GERD (gastroesophageal reflux disease)    History of blood transfusion    age 18   Hyperlipidemia    Hypertension    Hypothyroid    Lung nodule    Melanoma in situ of face (HCC) JUNE 2015   LEFT CHEEK   Meningioma (HCC)    brain lining   Migraine    reports only having one   Osteoarthritis    Personal history of radiation therapy    Radiation 09/16/14-10/07/14   Right  Breast  21 fractions   Skin cancer    Stroke (HCC) 2015   Blind in left eye as a result   Patient Active Problem List   Diagnosis Date Noted   Protein-calorie malnutrition, severe 05/25/2023   CAP (community acquired pneumonia) 05/22/2023   Weight loss 01/07/2023   Generalized weakness 01/07/2023   Hypothyroidism due to acquired atrophy of thyroid  07/09/2017   Constipation due to opioid therapy 07/09/2017   Osteoporosis 07/09/2017   Anemia associated with acute blood loss 07/09/2017   Closed fracture of acetabulum with dislocation of left hip (HCC) 07/03/2017   S/p left hip fracture 07/03/2017   Primary cancer of left lower lobe of lung (HCC) 11/25/2014   Genetic testing 07/01/2014   Family history  of breast cancer    Family history of bladder cancer    Family history of prostate cancer    Breast cancer of upper-outer quadrant of right female breast (HCC) 05/04/2014   Arthritis    Hyperlipidemia 01/12/2014   Meningioma (HCC) 01/04/2014   Melanoma in situ of face (HCC) 11/02/2013   Central retinal artery occlusion of left eye 10/24/2013   GERD (gastroesophageal reflux disease) 10/24/2013   Home Medication(s) Prior to Admission medications   Medication Sig Start Date End Date Taking? Authorizing Provider  furosemide (LASIX) 20 MG tablet Take 1 tablet (20 mg total) by mouth daily for 5 days. 05/04/24 05/09/24 Yes Mannie Pac T, DO  aspirin  81 MG EC tablet Take 81 mg by mouth at bedtime. 05/09/09   [provider]  atenolol  (TENORMIN ) 50 MG tablet Take 50 mg by mouth at bedtime.     [provider]  b complex vitamins capsule Take 1 capsule by mouth daily.    [provider]  benzonatate (TESSALON) 100 MG capsule Take 1 capsule (100 mg total) by mouth 3 (three) times daily as needed for cough. 05/02/24   Vonna Sharlet POUR, MD  calcium -vitamin D  (OSCAL WITH D) 500-200 MG-UNIT per tablet Take 1 tablet by mouth at bedtime.     [provider]  Cholecalciferol  (VITAMIN D3) 1000 units CAPS Take 1,000 Units by mouth at bedtime.    [provider]  co-enzyme Q-10 30 MG capsule Take 30 mg by mouth daily.    [provider]  denosumab  (PROLIA ) 60 MG/ML SOSY injection Inject 60 mg into the skin every 6 (six) months. 11/03/15   [provider]  Digestive Enzymes (ENZYME DIGEST PO) Take 5 mLs by mouth 3 (three) times daily with meals.    [provider]  levothyroxine  (SYNTHROID , LEVOTHROID) 112 MCG tablet Take 112 mcg by mouth daily before breakfast.     [provider]  lisinopril  (PRINIVIL ,ZESTRIL ) 10 MG tablet Take 1 tablet (10 mg total) by mouth daily. Patient taking differently: Take 10 mg by mouth 2 (two) times  daily. 11/29/14   Gold, Wayne E, PA-C  meclizine (ANTIVERT) 12.5 MG tablet Take 12.5 mg by mouth 3 (three) times daily as needed for dizziness.    [provider]  Multiple Vitamin (MULTIVITAMIN) tablet Take 1 tablet by mouth every morning.     [provider]  nystatin  (MYCOSTATIN ) 100000 UNIT/ML suspension Take 5 mLs by mouth 4 (four) times daily. 05/15/23   [provider]  osimertinib  mesylate (TAGRISSO ) 40 MG tablet Take 1 tablet (40 mg total) by mouth daily. 04/20/24   Lanny Callander, MD  Triamcinolone Acetonide (NASACORT AQ NA) Place 1 spray into the nose at bedtime as needed (CONGESTION).    [provider]  vitamin E 180 MG (400 UNITS) capsule Take 400 Units by mouth every Monday, Wednesday, and Friday. Mon Wed Fri    [provider]                                                                                                                                    Past Surgical History Past Surgical History:  Procedure Laterality Date   ABDOMINAL HYSTERECTOMY  ~ 1   APPENDECTOMY     age 45   BILATERAL SALPINGOOPHORECTOMY     BLADDER REPAIR  2009   cystocele   BRAIN MENINGIOMA EXCISION     Gamma knife to Meningioma in brain lining   BREAST BIOPSY     BREAST LUMPECTOMY Right 07/23/2014   invasive ductal   BUNIONECTOMY     bilateral great toe   CARDIAC CATHETERIZATION  2004   CHOLECYSTECTOMY  ~ 1997   COLONOSCOPY     CRYO INTERCOSTAL NERVE BLOCK Left 11/25/2014   Procedure: CRYO INTERCOSTAL NERVE BLOCK;  Surgeon: Elspeth JAYSON Millers, MD;  Location: [facility] OR;  Service: Thoracic;  Laterality: Left;   CT RADIATION THERAPY GUIDE     Gamma radiation -lt frontal-[facility]   DILATION AND CURETTAGE OF UTERUS     X 2   HIP ARTHROPLASTY Left 07/04/2017   Procedure: ARTHROPLASTY BIPOLAR HIP (HEMIARTHROPLASTY);  Surgeon: Dozier Soulier, MD;  Location: [facility] OR;  Service: Orthopedics;  Laterality: Left;   IR  THORACENTESIS ASP PLEURAL SPACE W/IMG GUIDE   02/27/2023   MELANOMA EXCISION Left 11/05/23, 11/30/13   CHEEK   SEGMENTECOMY Left 11/25/2014   Procedure: LEFT LOWER LOBE SEGMENTECTOMY;  Surgeon: Elspeth JAYSON Millers, MD;  Location: [facility] OR;  Service: Thoracic;  Laterality: Left;   VIDEO ASSISTED THORACOSCOPY Left 11/25/2014   Procedure: VIDEO ASSISTED THORACOSCOPY;  Surgeon: Elspeth JAYSON Millers, MD;  Location: [facility] OR;  Service: Thoracic;  Laterality: Left;   VIDEO BRONCHOSCOPY WITH ENDOBRONCHIAL NAVIGATION N/A 06/19/2017   Procedure: VIDEO BRONCHOSCOPY;  Surgeon: Millers Elspeth JAYSON, MD;  Location: [facility] OR;  Service: Thoracic;  Laterality: N/A;   VIDEO BRONCHOSCOPY WITH ENDOBRONCHIAL ULTRASOUND N/A 11/25/2014   Procedure: VIDEO BRONCHOSCOPY WITH ENDOBRONCHIAL ULTRASOUND;  Surgeon: Elspeth JAYSON Millers, MD;  Location: [facility] OR;  Service: Thoracic;  Laterality: N/A;   Family History Family History  Problem Relation Age of Onset   CAD Mother        CABG   Hyperlipidemia Mother    AAA (abdominal aortic aneurysm) Mother    Renal Disease Father    Kidney disease Father    CAD Father    Cancer Father        PROSTATE   Diabetes Brother    Hyperlipidemia Brother    Breast cancer Daughter 14   Breast cancer Sister 57   Breast cancer Sister 62   Cancer Brother        NOS   Bladder Cancer Brother 47   Lung cancer Cousin        3 paternal cousins with lung cancer   Breast cancer Cousin        five paternal first cousins   Cancer Cousin        4 paternal first cousins with Cancer NOS   Cancer Cousin        1 paternal cousin with oral cancer (tongue)    Social History Social History   Tobacco Use   Smoking status: Never   Smokeless tobacco: Never  Vaping Use   Vaping status: Never Used  Substance Use Topics   Alcohol  use: No   Drug use: No   Allergies Bovine (beef) protein-containing drug products, Other, Chocolate, Ibuprofen, Niacin and related, and Tramadol  Review of Systems Review of Systems  Physical Exam Vital Signs  I  have reviewed the triage vital signs BP (!) 170/70 (BP Location: Left Arm)   Pulse 83   Temp 98.2 F (36.8 C) (Oral)   Resp 18   Ht 5' 5 (1.651 m)   Wt 49.5 kg   SpO2 100%   BMI 18.15 kg/m   Physical Exam Vitals reviewed.  HENT:     Head: Normocephalic.  Cardiovascular:     Rate and Rhythm: Normal rate.  Pulmonary:     Effort: Pulmonary effort is normal.     Breath sounds: No wheezing or rhonchi.  Abdominal:     Palpations: Abdomen is soft.  Musculoskeletal:        General: Normal range of motion.  Neurological:     General: No focal deficit present.     ED Results and Treatments Labs (all labs ordered are listed, but only abnormal results are displayed) Labs Reviewed  BASIC METABOLIC PANEL WITH GFR - Abnormal; Notable for the following components:      Result Value   Glucose, Bld 152 (*)    BUN 25 (*)    All other components within normal limits  CBC WITH DIFFERENTIAL/PLATELET - Abnormal; Notable for the following  components:   WBC 2.7 (*)    RBC 3.84 (*)    Hemoglobin 11.5 (*)    HCT 35.1 (*)    Platelets 74 (*)    Lymphs Abs 0.3 (*)    All other components within normal limits  RESPIRATORY PANEL BY PCR  TROPONIN T, HIGH SENSITIVITY  TROPONIN T, HIGH SENSITIVITY                                                                                                                          Radiology CT Chest Wo Contrast Result Date: 05/04/2024 CLINICAL DATA:  Pneumonia, complications suspected, x-ray done. History of lung cancer. EXAM: CT CHEST WITHOUT CONTRAST TECHNIQUE: Multidetector CT imaging of the chest was performed following the standard protocol without IV contrast. RADIATION DOSE REDUCTION: This exam was performed according to the departmental dose-optimization program which includes automated exposure control, adjustment of the mA and/or kV according to patient size and/or use of iterative reconstruction technique. COMPARISON:  Chest CT 01/13/2024 and chest  radiograph 05/04/2024 FINDINGS: Cardiovascular: Heart size is normal. No significant pericardial effusion. Atherosclerotic calcifications in thoracic aorta without aneurysm. Coronary artery calcifications. Mediastinum/Nodes: No significant mediastinal, hilar lymph or axillary node enlargement. Limited evaluation for lymphadenopathy without intravascular contrast. Lungs/Pleura: Small to moderate sized left pleural effusion has not significantly changed since 01/13/2024. 4 mm ground-glass opacity in the right upper lobe on image 69, sequence 9 has not significantly changed. Chronic scarring at the right lung apex with calcifications. Chronic subpleural densities in the anterior right lung with small air bronchograms. Chronic volume loss in the anterior right middle lobe. Chronic volume loss in the right lower lobe superior segment. Chronic pleural thickening with calcifications along the posterior left upper lung. Ill-defined ground-glass opacity in the left upper lung on image 81, sequence 9 measures approximately 6 mm and difficult to exclude interval enlargement. Additional tiny ground-glass foci in the left upper lung. Chronic volume loss and consolidation in the posterior left lower lobe. Nodular consolidation in left lower lobe on image 192, sequence 9 measures up to 1.8 cm and not significantly changed. Surgical staples in the left lung. Chronic small ground-glass opacity in the posterior right upper lobe on image 77, sequence 9. Upper Abdomen: Images of the upper abdomen are unremarkable. Musculoskeletal: No acute bone abnormality. IMPRESSION: 1. Small to moderate sized left pleural effusion has not significantly changed since 01/13/2024. 2. No evidence for an acute pneumonia. 3. Chronic areas of volume loss and consolidation in the lungs that have not significantly changed since 01/13/2024. Persistent small ground-glass foci remain indeterminate and recommend attention on follow-up. No clear evidence for  neoplastic disease progression. 4.  Aortic Atherosclerosis (ICD10-I70.0). Electronically Signed   By: Juliene Balder M.D.   On: 05/04/2024 11:10   DG Chest Portable 1 View Result Date: 05/04/2024 CLINICAL DATA:  Cough.  Shortness of breath. EXAM: PORTABLE CHEST 1 VIEW COMPARISON:  Chest radiograph 08/26/2023 and chest CT 01/13/2024 FINDINGS: Chronic densities at the  left lung base are compatible with a small left pleural effusion. Postoperative changes in the left lung. Heart size is stable. Scarring at the lung apices, right side greater than left. Negative for a pneumothorax. IMPRESSION: 1. Chronic densities at the left lung base are compatible with a chronic small left pleural effusion. 2. Postoperative changes in the left lung. Electronically Signed   By: Juliene Balder M.D.   On: 05/04/2024 09:32    Pertinent labs & imaging results that were available during my care of the patient were reviewed by me and considered in my medical decision making (see MDM for details).  Medications Ordered in ED Medications - No data to display                                                                                                                                   Procedures Procedures  (including critical care time)  Medical Decision Making / ED Course   This patient presents to the ED for concern of shortness of breath, this involves an extensive number of treatment options, and is a complaint that carries with it a high risk of complications and morbidity.  The differential diagnosis includes viral syndrome, pleural effusion, pneumonia, less likely PE, consider ACS.  MDM: Pleasant 89 year old female here today with shortness of breath.  She does have a history of lung cancer.  She currently takes Tagrisso .  She does have a history of pleural effusions.  Will obtain a chest x-ray, obtain blood work on the patient.  She is nontoxic-appearing.  Reviewed the patient's most recent oncology office note, as  well as urgent care note.  Reassessment 1:55 AM-patient with stable pleural effusion.  Believe that her URI is probably worsening her symptoms.  I sent an expanded panel.  Discussed pros and cons of the ED thoracentesis with the patient, she preferred to have this done on an outpatient basis.  I will send the patient with 20 of Lasix  to take over the next few days.  Will send a message to her oncologist through epic to help arrange for outpatient thoracentesis.  Labs reviewed, leukopenia stable, anemia stable.  Patient ambulatory with her cane, no dyspnea, able to speak in complete sentences.  Overall well-appearing.   Additional history obtained: -Additional history obtained from  -External records from outside source obtained and reviewed including: Chart review including previous notes, labs, imaging, consultation notes   Lab Tests: -I ordered, reviewed, and interpreted labs.   The pertinent results include:   Labs Reviewed  BASIC METABOLIC PANEL WITH GFR - Abnormal; Notable for the following components:      Result Value   Glucose, Bld 152 (*)    BUN 25 (*)    All other components within normal limits  CBC WITH DIFFERENTIAL/PLATELET - Abnormal; Notable for the following components:   WBC 2.7 (*)    RBC 3.84 (*)    Hemoglobin 11.5 (*)  HCT 35.1 (*)    Platelets 74 (*)    Lymphs Abs 0.3 (*)    All other components within normal limits  RESPIRATORY PANEL BY PCR  TROPONIN T, HIGH SENSITIVITY  TROPONIN T, HIGH SENSITIVITY      EKG left bundle branch block, no changes from prior.  EKG Interpretation Date/Time:  Monday May 04 2024 08:28:18 EST Ventricular Rate:  81 PR Interval:  188 QRS Duration:  156 QT Interval:  464 QTC Calculation: 539 R Axis:   36  Text Interpretation: Sinus rhythm Probable left atrial enlargement Left bundle branch block Baseline wander in lead(s) II No significant change since last tracing Confirmed by Mannie Pac [phone]) on 05/04/2024  11:58:36 AM         Imaging Studies ordered: I ordered imaging studies including chest x-ray, CT chest I independently visualized and interpreted imaging. I agree with the radiologist interpretation   Medicines ordered and prescription drug management: Meds ordered this encounter  Medications   furosemide (LASIX) 20 MG tablet    Sig: Take 1 tablet (20 mg total) by mouth daily for 5 days.    Dispense:  5 tablet    Refill:  0    -I have reviewed the patients home medicines and have made adjustments as needed    Cardiac Monitoring: The patient was maintained on a cardiac monitor.  I personally viewed and interpreted the cardiac monitored which showed an underlying rhythm of: Normal sinus rhythm  Social Determinants of Health:  Factors impacting patients care include: Multiple medical comorbidities including lung cancer   Reevaluation: After the interventions noted above, I reevaluated the patient and found that they have :improved  Co morbidities that complicate the patient evaluation  Past Medical History:  Diagnosis Date   Adenocarcinoma (HCC)    recurrent/notes 07/03/2017   Blind left eye 10/2013   cva   Breast cancer (HCC) 05/2014   ER+/PR+/Her2-     LUNG CANCER   Bundle branch block left    Chest pain    Family history of bladder cancer    Family history of breast cancer    Family history of prostate cancer    GERD (gastroesophageal reflux disease)    History of blood transfusion    age 69   Hyperlipidemia    Hypertension    Hypothyroid    Lung nodule    Melanoma in situ of face (HCC) JUNE 2015   LEFT CHEEK   Meningioma (HCC)    brain lining   Migraine    reports only having one   Osteoarthritis    Personal history of radiation therapy    Radiation 09/16/14-10/07/14   Right  Breast  21 fractions   Skin cancer    Stroke (HCC) 2015   Blind in left eye as a result      Dispostion: I considered admission for this patient, however she is appropriate  for outpatient follow-up.     Final Clinical Impression(s) / ED Diagnoses Final diagnoses:  Pleural effusion  Upper respiratory tract infection, unspecified type     @PCDICTATION @    Mannie Pac T, DO 05/04/24 1202

## 2024-05-05 ENCOUNTER — Ambulatory Visit (HOSPITAL_COMMUNITY): Payer: Self-pay

## 2024-05-05 LAB — CULTURE, GROUP A STREP (THRC)

## 2024-05-06 ENCOUNTER — Other Ambulatory Visit: Payer: Self-pay

## 2024-05-08 ENCOUNTER — Other Ambulatory Visit: Payer: Self-pay

## 2024-05-08 ENCOUNTER — Other Ambulatory Visit (HOSPITAL_COMMUNITY): Payer: Self-pay

## 2024-05-08 NOTE — Progress Notes (Signed)
 Specialty Pharmacy Refill Coordination Note  Melissa Snyder is a 89 y.o. female contacted today regarding refills of specialty medication(s) Osimertinib  Mesylate (TAGRISSO )   Patient requested Delivery   Delivery date: 05/12/24   Verified address: [address]  [place]   Medication will be filled on: 05/11/24

## 2024-05-11 ENCOUNTER — Other Ambulatory Visit: Payer: Self-pay

## 2024-05-19 ENCOUNTER — Other Ambulatory Visit: Payer: Self-pay

## 2024-05-19 NOTE — Progress Notes (Signed)
 Specialty Pharmacy Ongoing Clinical Assessment Note  Melissa Snyder is a 89 y.o. female who is being followed by the specialty pharmacy service for RxSp Oncology   Patient's specialty medication(s) reviewed today: Osimertinib  Mesylate (TAGRISSO )   Missed doses in the last 4 weeks: 0   Patient/Caregiver asked additional questions regarding drug-interactions, see intervention notes below.  Therapeutic benefit summary: Patient is achieving benefit   Adverse events/side effects summary: Experienced adverse events/side effects (leg spasms, unsure that they are from Tagrisso )   Patient's therapy is appropriate to: Continue    Goals Addressed             This Visit's Progress    Stabilization of disease   On track    Patient is on track. Patient will maintain adherence. Per visit notes from 04/20/24, CT 09/18/2023 and 01/13/2024 showed persistent small left pleural effusion, no evidence of progression, will continue current therapy.          Follow up: 6 months  Silvano LOISE Dolly Specialty Pharmacist   Clinical Intervention Note  Clinical Intervention Notes: Patient started on benzonatate  and Delsym for a cough and methocarbamol for muscle spasms, no DDIs identified with her Tagrisso .   Clinical Intervention Outcomes: Prevention of an adverse drug event   Silvano LOISE Dolly Karel Santa

## 2024-05-26 ENCOUNTER — Inpatient Hospital Stay: Attending: Hematology | Admitting: Hematology

## 2024-05-26 VITALS — BP 153/55 | HR 73 | Temp 97.3°F | Resp 16 | Ht 65.0 in | Wt 106.3 lb

## 2024-05-26 DIAGNOSIS — B9789 Other viral agents as the cause of diseases classified elsewhere: Secondary | ICD-10-CM | POA: Insufficient documentation

## 2024-05-26 DIAGNOSIS — Z923 Personal history of irradiation: Secondary | ICD-10-CM | POA: Insufficient documentation

## 2024-05-26 DIAGNOSIS — J029 Acute pharyngitis, unspecified: Secondary | ICD-10-CM | POA: Insufficient documentation

## 2024-05-26 DIAGNOSIS — C3432 Malignant neoplasm of lower lobe, left bronchus or lung: Secondary | ICD-10-CM | POA: Insufficient documentation

## 2024-05-26 DIAGNOSIS — Z79899 Other long term (current) drug therapy: Secondary | ICD-10-CM | POA: Insufficient documentation

## 2024-05-26 DIAGNOSIS — J90 Pleural effusion, not elsewhere classified: Secondary | ICD-10-CM | POA: Diagnosis not present

## 2024-05-26 NOTE — Assessment & Plan Note (Signed)
 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 Kerrin, pathology showing stage I adenocarcinoma -She had cancer recurrence in LLL in 06/2017 and in LUL in 10/2020. She was treated with radiation in 07/2017 and in 11/2020 with Dr. Dewey.  -She developed cancer recurrence with malignant left pleural effusion in November 2023 -[facility] showed EGFR L858R mutation(+), which indicating good response to EGFR inhibitor, I have started her on osimertinib  in Jan 2024 -she has slightly prolonged QTc, will monitor closely when she is on Osimertinib , repeated EKG showed normal QTc --PET scan 08/02/2022 showed mildly hypermetabolic right upper lung nodule, morphologically benign appearing mediastinal and hilar lymphadenopathy with metabolic activities, no other definitive metastasis.  The left pleura is not hypermetabolic on the PET scan. -She previously tolerated Tagrisso  40 mg daily very well, I increased the dose to 80 mg daily but did not tolerated well, and have reduced dose back to 40 mg daily -PET 05/10/2023 showed no significant cancer progression, with minimal activity in lymph nodes and pleura. Her pleural effusion increased and required thoracentesis in early December 2024, with clinical concern for disease progression. -Due to her advanced age, she is not interested in chemotherapy -restaging CT 09/18/2023 and 01/13/2024 showed persistent small left pleural effusion, no evidence of progression, will continue current therapy

## 2024-05-27 NOTE — Assessment & Plan Note (Signed)
 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 Kerrin, pathology showing stage I adenocarcinoma -She had cancer recurrence in LLL in 06/2017 and in LUL in 10/2020. She was treated with radiation in 07/2017 and in 11/2020 with Dr. Dewey.  -She developed cancer recurrence with malignant left pleural effusion in November 2023 -[facility] showed EGFR L858R mutation(+), which indicating good response to EGFR inhibitor, I have started her on osimertinib  in Jan 2024 -she has slightly prolonged QTc, will monitor closely when she is on Osimertinib , repeated EKG showed normal QTc --PET scan 08/02/2022 showed mildly hypermetabolic right upper lung nodule, morphologically benign appearing mediastinal and hilar lymphadenopathy with metabolic activities, no other definitive metastasis.  The left pleura is not hypermetabolic on the PET scan. -She previously tolerated Tagrisso  40 mg daily very well, I increased the dose to 80 mg daily but did not tolerated well, and have reduced dose back to 40 mg daily -PET 05/10/2023 showed no significant cancer progression, with minimal activity in lymph nodes and pleura. Her pleural effusion increased and required thoracentesis in early December 2024, with clinical concern for disease progression. -Due to her advanced age, she is not interested in chemotherapy -restaging CT 09/18/2023 and 01/13/2024 showed persistent small left pleural effusion, no evidence of progression, will continue current therapy

## 2024-05-27 NOTE — Progress Notes (Signed)
 " [facility]   [phone] [phone]   Clinic Follow up Note   Patient Care Team: Shayne Anes, MD as PCP - General (Internal Medicine) Kerrin Elspeth BROCKS, MD as Consulting Physician (Cardiothoracic Surgery) Gaston Hamilton, MD as Consulting Physician (Urology) Lanny Callander, MD as Consulting Physician (Hematology) Keenan Hastings, MD as Consulting Physician (Radiation Oncology) Lanell Donald Stagger, PA-C as Physician Assistant (Radiation Oncology) Imaging, [facility] as Consulting Physician (Diagnostic Radiology)  Date of Service:  05/26/2024  CHIEF COMPLAINT: f/u of NSCLC   CURRENT THERAPY:  Tagrisso    Oncology History   Primary cancer of left lower lobe of lung (HCC) Stage I left lung adenocarcinoma, pT1aN0M0 in 2016, LLL recurrence in 06/2017, LUL recurrence in 10/2020, malignant left pleural effusion in 04/2022, EGFR L858R mutation(+) -initially diagnosed in 11/2014, s/p LLL segmentectomy by Dr. Kerrin, pathology showing stage I adenocarcinoma -She had cancer recurrence in LLL in 06/2017 and in LUL in 10/2020. She was treated with radiation in 07/2017 and in 11/2020 with Dr. Dewey.  -She developed cancer recurrence with malignant left pleural effusion in November 2023 -[facility] showed EGFR L858R mutation(+), which indicating good response to EGFR inhibitor, I have started her on osimertinib  in Jan 2024 -she has slightly prolonged QTc, will monitor closely when she is on Osimertinib , repeated EKG showed normal QTc --PET scan 08/02/2022 showed mildly hypermetabolic right upper lung nodule, morphologically benign appearing mediastinal and hilar lymphadenopathy with metabolic activities, no other definitive metastasis.  The left pleura is not hypermetabolic on the PET scan. -She previously tolerated Tagrisso  40 mg daily very well, I increased the dose to 80 mg daily but did not tolerated well, and have reduced dose back  to 40 mg daily -PET 05/10/2023 showed no significant cancer progression, with minimal activity in lymph nodes and pleura. Her pleural effusion increased and required thoracentesis in early December 2024, with clinical concern for disease progression. -Due to her advanced age, she is not interested in chemotherapy -restaging CT 09/18/2023 and 01/13/2024 showed persistent small left pleural effusion, no evidence of progression, will continue current therapy  Assessment & Plan Metastatic EGFR-mutated non-small cell lung cancer Disease remains stable on EGFR inhibitor therapy, with no significant change on recent CT imaging. She has not developed acute oncologic complications. - Advised continuation of daily Takrasol as previously prescribed. - Scheduled follow-up appointment in two months.  Acute viral respiratory infection She experienced an acute viral respiratory illness characterized by cough, sore throat, and sputum production, which has largely resolved with supportive care. Imaging showed no pneumonia or secondary bacterial infection. She is clinically improved, with only minimal residual cough and no respiratory compromise. - Recommended supportive care only, as she has recovered from the viral infection. - Discussed optional use of cough suppressant as needed for residual cough; clarified that three times daily dosing is acceptable and does not need to be strictly every eight hours. - Advised mask use in public during flu season for infection prevention.  Pleural effusion secondary to lung cancer Pleural effusion remains unchanged in size on recent CT imaging and is asymptomatic, with stable lung sounds and oxygenation. - Assessed pleural effusion with recent CT imaging, which showed no significant change. - No intervention required at this time.  Plan -she has recovered well from recent episode of upper respiratory infection - Continue Tagrisso , follow-up in 2 months as  scheduled   SUMMARY OF ONCOLOGIC HISTORY: Oncology History Overview Note   Cancer Staging  Breast cancer  of upper-outer quadrant of right female breast [facility]) Staging form: Breast, AJCC 7th Edition - Clinical stage from 05/19/2014: Stage IA (T1b, N0, M0) - Signed by Letha Truman ORN, NP on 11/17/2015 - Pathologic stage from 10/17/2014: Stage IA (T1b, N0, cM0) - Signed by Keenan Hastings, MD on 10/17/2014  Primary cancer of left lower lobe of lung [facility]) Staging form: Lung, AJCC 7th Edition - Clinical stage from 12/14/2014: Stage IA (T1a, N0, M0) - Unsigned    Breast cancer of upper-outer quadrant of right female breast (HCC)  05/04/2014 Initial Diagnosis   Breast cancer   05/19/2014 Imaging   Diagnostic mammogram and ultrasound showed a 7 mm irregular suspicious mass located in the right breast at the 10:00 position, 7 cm from the nipple. Otherwise negative.   05/19/2014 Initial Biopsy   Right breast biopsy (UOQ): IDC, grade 2.    05/19/2014 Receptors her2   ER 100% positive, PR 35% positive, HER-2 negative, Ki67 8%   06/07/2014 Miscellaneous   Genetic testing normal.  APC, ATM, BARD1, BMPR1A, BRCA1, BRCA2, BRIP1, CDH1, CDK4, CDKN2A, CHEK2, EPCAM, GREM1, MLH1, MRE11A, MSH2, MSH6, MUTYH, NBN, NF1, PALB2, PMS2, POLD1, POLE, PTEN, RAD50, RAD51D, SMAD4, SMARCA4, STK11, and TP53 tested.    07/23/2014 Surgery   Right breast lumpectomy with SLNB Jeoffrey) showed invasive ductal carcinoma & DCIS; negative surgical margins.   07/23/2014 Pathology Results   Invasive ductal carcinoma, tumor size 0.8 cm, grade 1, no lymphovascular invasion, margins were negative, 2 sentinel lymph nodes were negative. (+) DCIS.   07/23/2014 Pathologic Stage   pT1b, pN0: Stage IA    09/16/2014 - 10/07/2014 Radiation Therapy   XRT completed Signe). Right breast/ 42.72 Gy at 2.67 Gy per fraction x 21 fractions.     Anti-estrogen oral therapy   Aromasin  prescribed in 01/2015 but Patient declines anti-estrogen  therapy at this time d/t high co-pay of medication and possible side effects of AI therapy.    05/02/2015 Mammogram   Diagnostic TOMO bilat mammogram: No evidence of malignancy in either breast. Lumpectomy changes in right breast. Diagnostic mammo suggested in 1 year.    Primary cancer of left lower lobe of lung (HCC)  10/25/2013 Imaging   CXR done for stroke protocol. No acute chest findings. Chronic lung disease with biapical scarring. Developing nodule in (L) apex cannot be excluded based on exam, athough may be d/t overlap of osseous structures.    10/27/2013 Imaging   CXR: Nodular opacity in mid (L) apex and appears more prominent than prior study. Area that appeared masslike in LUL near apex on recent CXR not seen at this time.  Underlying emphysema present. No edema or consolidation   11/12/2013 Imaging   CT chest: At left lung apex, there is asymmetric pleural parenchymal scarring. No discrete lesion.  In superior segment of LLL, there are 1 dominant ground-glass opacity measuring 16 mm. 2 addt'l ground-glass opacities appreciated. F/U with CT in 3 months   02/11/2014 Imaging   CT chest: Several small bilat faint nodular densities, largest measures 1.3 cm over superior segment LLL. No new nodules, no adenopathy. Cannot exclude metastatic disease given pt's h/o melanoma. Recommend PET-CT   02/23/2014 PET scan   Persistent semi-solid nodule in superior segment LLL with low metabolic activity. Moderately metabolic subcarinal & hilar LN are likely reactive.    05/25/2014 Imaging   CT chest: Persistent & similar size dominant LLL sub-solid nodule. Adenocarcinoma is considered. Biopsy or resection strongly considered. Additional scattered small ground-glass nodules stable.  06/02/2014 Imaging   MRI brain: Grossly stable appearance of left planum sphenoidale meningioma measuring 20 x 14 mm. Meningioma does not extend to left orbital apex & contacts left optic nerve. Stable right posterior  clinoid meningioma .    09/27/2014 Imaging   CT chest: Further enlargment of central solid component in sub-solid LLL pulm nodule. Findings remain concerning for adenoca. Additional scattered small bilat ground-glass nodules and biapical scarring stable. No adenopathy.     11/25/2014 Surgery   Video bronchoscopy with endobronchial ultrasound; Video assisted thoracoscopy; Thoracoscopic LLL superior segmentectomy; Mediastinal lymph node dissection; Cyro intercostal nerve block Arnaldo)   11/25/2014 Pathology Results   Superior segment lung resection (LLL). Adenocarcinoma, well-diff, spanning 1.5 cm. Surgical margins negative. 5 LNs negative.    11/25/2014 Pathologic Stage   pT1a, pN0: Stage IA    11/25/2014 Initial Diagnosis   Primary cancer of left lower lobe of lung (HCC)   05/31/2015 Imaging   CT chest: No evidence of local recurrent at superior segmentectomy in LLL. New small nodular focus of consolidation surrounding thin parenchymal band. Additional sub-cm ground-glass and solid nodules favored to be benign. No thoracic adenopathy   08/11/2015 Imaging   CT chest: No evidence of residual or recurrent tumor in LLL. Stable small nodular focus in LLL with thin surrounding parenchymal band, favored to be benign.  Multiple small ground-glass nodules unchanged.    06/19/2017 Pathology Results   Diagnosis 1. Lung, biopsy, Left lower lobe - ADENOCARCINOMA. - SEE COMMENT. 2. Lung, biopsy, Left lower lobe - BENIGN LUNG PARENCHYMA. - THERE IS NO EVIDENCE OF MALIGNANCY.   07/09/2017 - 07/22/2017 Radiation Therapy   Radiation treatment dates: 07/09/17-07/22/17 by Dr. Dewey    Site/dose:  Left lung/ 50 Gy in 10 fractions   Beams/energy:  IMRT/ 6X   Narrative: The patient tolerated radiation treatment relatively well. She denied pain, fatigue, or difficulty swallowing. Skin to the treatment area appears warm, dry, and normal in color.   06/18/2019 Imaging   CT Chest  IMPRESSION: 1. There are  2 round solid nodules within the left lower lobe which have increased in size when compared with the previous exam, suspicious. Additionally, along the area of postsurgical change around the oblique fissure of the left lung there is progressive thickening with persistent loculated fluid. Cannot rule out local tumor recurrence along the suture line. 2. No significant change in the appearance of bilateral upper lobe sub solid lung nodules. 3. Aortic Atherosclerosis (ICD10-I70.0). Coronary artery calcifications.     07/07/2019 PET scan   IMPRESSION: 1. No evidence of hypermetabolic local tumor recurrence in the left lung. Low level nonfocal uptake along the segmentectomy site in the left lower lobe favors post treatment change. Continued chest CT surveillance warranted. 2. Two small solid left lower lobe pulmonary nodules, largest 0.7 cm, below PET resolution. Given the growth of these nodules since 10/03/2018 chest CT, metastatic disease remains on the differential. Close chest CT surveillance recommended. 3. Nonspecific hypermetabolism within nonenlarged subcarinal and bilateral hilar lymph nodes, not substantially changed since 03/04/2017 PET-CT study, favoring reactive uptake. 4. No hypermetabolic distant metastatic disease. 5. Chronic findings include: Aortic Atherosclerosis (ICD10-I70.0). Subcentimeter ground-glass pulmonary nodules in the upper lobes are unchanged and are below PET resolution. Moderate sigmoid diverticulosis. Chronic right maxillary sinusitis.   04/13/2020 Imaging   CT Chest  IMPRESSION: 1. Primarily similar appearance of the lungs compared to 10/14/2019. Left sided surgical sutures with similar solid and subsolid pulmonary nodules as detailed above. A right middle lobe  pleural based 5 mm nodule is new since the prior exam, warranting follow-up attention. 2. No thoracic adenopathy. 3. Aortic atherosclerosis (ICD10-I70.0), coronary artery atherosclerosis and  emphysema (ICD10-J43.9).   10/10/2020 Imaging   CT chest  IMPRESSION: Postsurgical and post radiation changes in the lungs bilaterally, as above.   12 x 10 mm left lower lobe nodule, mildly progressive. Additional bilobed nodule in the posterior left lower lobe, also favored to be progressive. PET-CT is suggested for further evaluation.   Aortic Atherosclerosis (ICD10-I70.0) and Emphysema (ICD10-J43.9).   11/22/2020 - 12/02/2020 Radiation Therapy   Site Technique Total Dose (Gy) Dose per Fx (Gy) Completed Fx Beam Energies  Lung, Left: Lung_Lt_LLL IMRT 60/60 12 5/5 6XFFF     05/22/2021 Imaging   EXAM: CT CHEST WITHOUT CONTRAST  IMPRESSION: 1. Interval development of a new area of volume loss, ground-glass opacity, and bronchiectasis in the posterior left lower lobe. Imaging features are compatible with radiation therapy. 2. The posterior left lung nodules, including index 12 x 10 mm nodule described previously have become incorporated into the post treatment change on the current study. 3. Interval resolution of the posterior right middle lobe nodularity seen previously. 4. Other scattered areas of consolidative opacity or similar. 5. Trace left pleural effusion. 6. Aortic Atherosclerosis (ICD10-I70.0).      Discussed the use of AI scribe software for clinical note transcription with the patient, who gave verbal consent to proceed.  History of Present Illness Melissa Snyder is an 89 year old female with metastatic EGFR-mutated non-small cell lung cancer who presents for follow-up of acute cough and respiratory symptoms.  She developed sneezing and dry cough on Thanksgiving that progressed over several days to sore throat and persistent cough, for which urgent care prescribed a 14-day course of cough medication. She later completed her last dose of Beyfortus.  On December 1st, her cough became deeper, painful, and productive of yellow sputum, leading to an emergency department  visit where chest x-ray and CT were performed. She received a refill of her cough medication and a short course of furosemide , which she did not take.  The next day, with ongoing deep painful cough, her primary care provider prescribed additional cough medication, an over-the-counter cough syrup, and a probiotic. Within two days she had marked improvement, and at this visit has only occasional cough with minimal sputum and no change in baseline breathing. She has continued daily Takrasol throughout her illness without interruption.  She reports easy bruising around her elbows from minor trauma without abnormal bleeding. She denies fever, chills, sweats, gastrointestinal symptoms, appetite change, or bowel disturbance. She received her seasonal influenza vaccination, and her husband remained asymptomatic.     All other systems were reviewed with the patient and are negative.  MEDICAL HISTORY:  Past Medical History:  Diagnosis Date   Adenocarcinoma (HCC)    recurrent/notes 07/03/2017   Blind left eye 10/2013   cva   Breast cancer (HCC) 05/2014   ER+/PR+/Her2-     LUNG CANCER   Bundle branch block left    Chest pain    Family history of bladder cancer    Family history of breast cancer    Family history of prostate cancer    GERD (gastroesophageal reflux disease)    History of blood transfusion    age 58   Hyperlipidemia    Hypertension    Hypothyroid    Lung nodule    Melanoma in situ of face (HCC) JUNE 2015   LEFT CHEEK  Meningioma (HCC)    brain lining   Migraine    reports only having one   Osteoarthritis    Personal history of radiation therapy    Radiation 09/16/14-10/07/14   Right  Breast  21 fractions   Skin cancer    Stroke (HCC) 2015   Blind in left eye as a result    SURGICAL HISTORY: Past Surgical History:  Procedure Laterality Date   ABDOMINAL HYSTERECTOMY  ~ 1997   APPENDECTOMY     age 81   BILATERAL SALPINGOOPHORECTOMY     BLADDER REPAIR  2009   cystocele    BRAIN MENINGIOMA EXCISION     Gamma knife to Meningioma in brain lining   BREAST BIOPSY     BREAST LUMPECTOMY Right 07/23/2014   invasive ductal   BUNIONECTOMY     bilateral great toe   CARDIAC CATHETERIZATION  2004   CHOLECYSTECTOMY  ~ 1997   COLONOSCOPY     CRYO INTERCOSTAL NERVE BLOCK Left 11/25/2014   Procedure: CRYO INTERCOSTAL NERVE BLOCK;  Surgeon: Elspeth JAYSON Millers, MD;  Location: [facility] OR;  Service: Thoracic;  Laterality: Left;   CT RADIATION THERAPY GUIDE     Gamma radiation -lt frontal-[facility]   DILATION AND CURETTAGE OF UTERUS     X 2   HIP ARTHROPLASTY Left 07/04/2017   Procedure: ARTHROPLASTY BIPOLAR HIP (HEMIARTHROPLASTY);  Surgeon: Dozier Soulier, MD;  Location: [facility] OR;  Service: Orthopedics;  Laterality: Left;   IR THORACENTESIS ASP PLEURAL SPACE W/IMG GUIDE  02/27/2023   MELANOMA EXCISION Left 11/05/23, 11/30/13   CHEEK   SEGMENTECOMY Left 11/25/2014   Procedure: LEFT LOWER LOBE SEGMENTECTOMY;  Surgeon: Elspeth JAYSON Millers, MD;  Location: [facility] OR;  Service: Thoracic;  Laterality: Left;   VIDEO ASSISTED THORACOSCOPY Left 11/25/2014   Procedure: VIDEO ASSISTED THORACOSCOPY;  Surgeon: Elspeth JAYSON Millers, MD;  Location: [facility] OR;  Service: Thoracic;  Laterality: Left;   VIDEO BRONCHOSCOPY WITH ENDOBRONCHIAL NAVIGATION N/A 06/19/2017   Procedure: VIDEO BRONCHOSCOPY;  Surgeon: Millers Elspeth JAYSON, MD;  Location: [facility] OR;  Service: Thoracic;  Laterality: N/A;   VIDEO BRONCHOSCOPY WITH ENDOBRONCHIAL ULTRASOUND N/A 11/25/2014   Procedure: VIDEO BRONCHOSCOPY WITH ENDOBRONCHIAL ULTRASOUND;  Surgeon: Elspeth JAYSON Millers, MD;  Location: [facility] OR;  Service: Thoracic;  Laterality: N/A;    I have reviewed the social history and family history with the patient and they are unchanged from previous note.  ALLERGIES:  is allergic to bovine (beef) protein-containing drug products, other, chocolate, ibuprofen, niacin and related, and tramadol.  MEDICATIONS:  Current Outpatient Medications   Medication Sig Dispense Refill   aspirin  81 MG EC tablet Take 81 mg by mouth at bedtime.     atenolol  (TENORMIN ) 50 MG tablet Take 50 mg by mouth at bedtime.      b complex vitamins capsule Take 1 capsule by mouth daily.     benzonatate  (TESSALON ) 100 MG capsule Take 1 capsule (100 mg total) by mouth 3 (three) times daily as needed for cough. 21 capsule 0   calcium -vitamin D  (OSCAL WITH D) 500-200 MG-UNIT per tablet Take 1 tablet by mouth at bedtime.      Cholecalciferol  (VITAMIN D3) 1000 units CAPS Take 1,000 Units by mouth at bedtime.     co-enzyme Q-10 30 MG capsule Take 30 mg by mouth daily.     denosumab  (PROLIA ) 60 MG/ML SOSY injection Inject 60 mg into the skin every 6 (six) months.     Digestive Enzymes (ENZYME DIGEST PO) Take 5 mLs by  mouth 3 (three) times daily with meals.     levothyroxine  (SYNTHROID , LEVOTHROID) 112 MCG tablet Take 112 mcg by mouth daily before breakfast.      lisinopril  (PRINIVIL ,ZESTRIL ) 10 MG tablet Take 1 tablet (10 mg total) by mouth daily. (Patient taking differently: Take 10 mg by mouth 2 (two) times daily.) 30 tablet 1   meclizine (ANTIVERT) 12.5 MG tablet Take 12.5 mg by mouth 3 (three) times daily as needed for dizziness.     Multiple Vitamin (MULTIVITAMIN) tablet Take 1 tablet by mouth every morning.      nystatin  (MYCOSTATIN ) 100000 UNIT/ML suspension Take 5 mLs by mouth 4 (four) times daily.     osimertinib  mesylate (TAGRISSO ) 40 MG tablet Take 1 tablet (40 mg total) by mouth daily. 30 tablet 2   Triamcinolone Acetonide (NASACORT AQ NA) Place 1 spray into the nose at bedtime as needed (CONGESTION).     vitamin E 180 MG (400 UNITS) capsule Take 400 Units by mouth every Monday, Wednesday, and Friday. Mon Wed Fri     No current facility-administered medications for this visit.    PHYSICAL EXAMINATION: ECOG PERFORMANCE STATUS: 2 - Symptomatic, <50% confined to bed  Vitals:   05/26/24 1626 05/26/24 1627  BP: (!) 155/64 (!) 153/55  Pulse: 76 73   Resp: 18 16  Temp: (!) 97.3 F (36.3 C)   SpO2: 99% 100%   Wt Readings from Last 3 Encounters:  05/26/24 106 lb 4.8 oz (48.2 kg)  05/04/24 109 lb 1.6 oz (49.5 kg)  04/20/24 109 lb 1.6 oz (49.5 kg)     GENERAL:alert, no distress and comfortable SKIN: skin color, texture, turgor are normal, no rashes or significant lesions EYES: normal, Conjunctiva are pink and non-injected, sclera clear NECK: supple, thyroid  normal size, non-tender, without nodularity LYMPH:  no palpable lymphadenopathy in the cervical, axillary  LUNGS: clear to auscultation and percussion with normal breathing effort with slightly decreased breath sound at the left lung base HEART: regular rate & rhythm and no murmurs and no lower extremity edema ABDOMEN:abdomen soft, non-tender and normal bowel sounds Musculoskeletal:no cyanosis of digits and no clubbing  NEURO: alert & oriented x 3 with fluent speech, no focal motor/sensory deficits  Physical Exam   LABORATORY DATA:  I have reviewed the data as listed    Latest Ref Rng & Units 05/04/2024    8:46 AM 04/20/2024   10:07 AM 01/27/2024   10:47 AM  CBC  WBC 4.0 - 10.5 K/uL 2.7  3.7  3.3   Hemoglobin 12.0 - 15.0 g/dL 88.4  88.1  86.8   Hematocrit 36.0 - 46.0 % 35.1  36.1  39.3   Platelets 150 - 400 K/uL 74  94  90         Latest Ref Rng & Units 05/04/2024    8:46 AM 04/20/2024   10:07 AM 01/27/2024   10:47 AM  CMP  Glucose 70 - 99 mg/dL 847  883  864   BUN 8 - 23 mg/dL 25  29  24    Creatinine 0.44 - 1.00 mg/dL 9.25  9.24  9.31   Sodium 135 - 145 mmol/L 136  138  139   Potassium 3.5 - 5.1 mmol/L 4.0  4.6  4.2   Chloride 98 - 111 mmol/L 99  103  102   CO2 22 - 32 mmol/L 29  31  33   Calcium  8.9 - 10.3 mg/dL 9.0  9.3  9.2   Total Protein 6.5 - 8.1  g/dL  6.6  6.9   Total Bilirubin 0.0 - 1.2 mg/dL  0.6  0.6   Alkaline Phos 38 - 126 U/L  67  86   AST 15 - 41 U/L  22  24   ALT 0 - 44 U/L  20  23       RADIOGRAPHIC STUDIES: I have personally reviewed  the radiological images as listed and agreed with the findings in the report. No results found.    No orders of the defined types were placed in this encounter.  All questions were answered. The patient knows to call the clinic with any problems, questions or concerns. No barriers to learning was detected. The total time spent in the appointment was 20 minutes, including review of chart and various tests results, discussions about plan of care and coordination of care plan     Onita Mattock, MD 05/26/2024    "

## 2024-06-02 ENCOUNTER — Other Ambulatory Visit (HOSPITAL_COMMUNITY): Payer: Self-pay

## 2024-06-05 ENCOUNTER — Other Ambulatory Visit: Payer: Self-pay

## 2024-06-05 ENCOUNTER — Telehealth: Payer: Self-pay

## 2024-06-05 ENCOUNTER — Other Ambulatory Visit (HOSPITAL_COMMUNITY): Payer: Self-pay

## 2024-06-05 NOTE — Progress Notes (Signed)
 Specialty Pharmacy Refill Coordination Note  Melissa Snyder is a 89 y.o. female contacted today regarding refills of specialty medication(s) Osimertinib  Mesylate (TAGRISSO )   Patient requested Delivery   Delivery date: 06/10/24   Verified address: [address]  [place]   Medication will be filled on: 06/09/24

## 2024-06-05 NOTE — Telephone Encounter (Signed)
 Oral Oncology Patient Advocate Encounter  Was successful in securing patient a $6000 grant from [facility] to provide copayment coverage for Tagrisso .  This will keep the out of pocket expense at $0.     [employer] ID: [id number]   The billing information is as follows and has been shared with [facility].    RxBin: [id number] PCN: [id number] Member ID: [id number] Group ID: [id number] Dates of Eligibility: 05/06/24 through 05/05/25  Fund:  NSCLC  Lucie Lamer, CPhT [place]  [facility] Oncology Pharmacy Patient Advocate Specialist II [place] Phone: [phone]  Fax: [phone] [email or web address]

## 2024-06-09 ENCOUNTER — Other Ambulatory Visit: Payer: Self-pay

## 2024-07-02 ENCOUNTER — Other Ambulatory Visit (HOSPITAL_COMMUNITY): Payer: Self-pay

## 2024-07-06 ENCOUNTER — Other Ambulatory Visit (HOSPITAL_COMMUNITY): Payer: Self-pay

## 2024-07-08 ENCOUNTER — Other Ambulatory Visit: Payer: Self-pay

## 2024-07-08 ENCOUNTER — Other Ambulatory Visit (HOSPITAL_COMMUNITY): Payer: Self-pay

## 2024-07-08 NOTE — Progress Notes (Signed)
 Specialty Pharmacy Refill Coordination Note  Melissa Snyder is a 89 y.o. female contacted today regarding refills of specialty medication(s) Osimertinib  Mesylate (TAGRISSO )   Patient requested Delivery   Delivery date: 07/13/24   Verified address: [address]  [place]   Medication will be filled on: 07/10/24

## 2024-07-10 ENCOUNTER — Other Ambulatory Visit: Payer: Self-pay

## 2024-07-14 ENCOUNTER — Ambulatory Visit (HOSPITAL_COMMUNITY)

## 2024-07-21 ENCOUNTER — Inpatient Hospital Stay: Attending: Hematology

## 2024-07-21 ENCOUNTER — Inpatient Hospital Stay: Admitting: Hematology
